# Patient Record
Sex: Female | Born: 1940
Health system: Southern US, Community
[De-identification: ages and names within clinical notes are randomized; demographics above are authoritative.]

## PROBLEM LIST (undated history)

## (undated) DIAGNOSIS — I639 Cerebral infarction, unspecified: Secondary | ICD-10-CM

## (undated) DIAGNOSIS — I10 Essential (primary) hypertension: Secondary | ICD-10-CM

## (undated) DIAGNOSIS — G5712 Meralgia paresthetica, left lower limb: Secondary | ICD-10-CM

## (undated) DIAGNOSIS — E119 Type 2 diabetes mellitus without complications: Secondary | ICD-10-CM

## (undated) DIAGNOSIS — E785 Hyperlipidemia, unspecified: Secondary | ICD-10-CM

## (undated) DIAGNOSIS — H8110 Benign paroxysmal vertigo, unspecified ear: Secondary | ICD-10-CM

## (undated) HISTORY — DX: Type 2 diabetes mellitus without complications: E11.9

## (undated) HISTORY — DX: Hyperlipidemia, unspecified: E78.5

## (undated) HISTORY — DX: Cerebral infarction, unspecified: I63.9

## (undated) HISTORY — DX: Meralgia paresthetica, left lower limb: G57.12

---

## 2004-04-09 ENCOUNTER — Emergency Department (HOSPITAL_COMMUNITY): Admission: EM | Admit: 2004-04-09 | Discharge: 2004-04-09 | Payer: Self-pay | Admitting: Family Medicine

## 2004-12-04 ENCOUNTER — Inpatient Hospital Stay (HOSPITAL_COMMUNITY): Admission: EM | Admit: 2004-12-04 | Discharge: 2004-12-05 | Payer: Self-pay | Admitting: Emergency Medicine

## 2004-12-04 ENCOUNTER — Ambulatory Visit: Payer: Self-pay | Admitting: Internal Medicine

## 2005-12-25 ENCOUNTER — Emergency Department (HOSPITAL_COMMUNITY): Admission: EM | Admit: 2005-12-25 | Discharge: 2005-12-25 | Payer: Self-pay | Admitting: Emergency Medicine

## 2006-10-29 ENCOUNTER — Emergency Department (HOSPITAL_COMMUNITY): Admission: EM | Admit: 2006-10-29 | Discharge: 2006-10-29 | Payer: Self-pay | Admitting: Family Medicine

## 2007-01-13 ENCOUNTER — Encounter: Admission: RE | Admit: 2007-01-13 | Discharge: 2007-01-13 | Payer: Self-pay | Admitting: Internal Medicine

## 2007-06-22 ENCOUNTER — Emergency Department (HOSPITAL_COMMUNITY): Admission: EM | Admit: 2007-06-22 | Discharge: 2007-06-22 | Payer: Self-pay | Admitting: Emergency Medicine

## 2007-07-20 ENCOUNTER — Emergency Department (HOSPITAL_COMMUNITY): Admission: EM | Admit: 2007-07-20 | Discharge: 2007-07-20 | Payer: Self-pay | Admitting: Family Medicine

## 2007-07-22 ENCOUNTER — Emergency Department (HOSPITAL_COMMUNITY): Admission: EM | Admit: 2007-07-22 | Discharge: 2007-07-22 | Payer: Self-pay | Admitting: Emergency Medicine

## 2010-04-04 ENCOUNTER — Other Ambulatory Visit: Payer: Self-pay | Admitting: Internal Medicine

## 2010-04-04 DIAGNOSIS — Z1231 Encounter for screening mammogram for malignant neoplasm of breast: Secondary | ICD-10-CM

## 2010-04-06 ENCOUNTER — Ambulatory Visit: Payer: Self-pay

## 2010-04-30 ENCOUNTER — Ambulatory Visit
Admission: RE | Admit: 2010-04-30 | Discharge: 2010-04-30 | Disposition: A | Discharge status: Home / Self Care | Payer: Medicare Other | Source: Ambulatory Visit | Attending: Internal Medicine | Admitting: Internal Medicine

## 2010-04-30 DIAGNOSIS — Z1231 Encounter for screening mammogram for malignant neoplasm of breast: Secondary | ICD-10-CM

## 2010-05-17 ENCOUNTER — Emergency Department (HOSPITAL_COMMUNITY)
Admission: EM | Admit: 2010-05-17 | Discharge: 2010-05-17 | Disposition: A | Discharge status: Home / Self Care | Payer: Medicare Other | Attending: Emergency Medicine | Admitting: Emergency Medicine

## 2010-05-17 DIAGNOSIS — E7800 Pure hypercholesterolemia, unspecified: Secondary | ICD-10-CM | POA: Insufficient documentation

## 2010-05-17 DIAGNOSIS — S30860A Insect bite (nonvenomous) of lower back and pelvis, initial encounter: Secondary | ICD-10-CM | POA: Insufficient documentation

## 2010-05-17 DIAGNOSIS — W57XXXA Bitten or stung by nonvenomous insect and other nonvenomous arthropods, initial encounter: Secondary | ICD-10-CM | POA: Insufficient documentation

## 2010-05-17 DIAGNOSIS — H8109 Meniere's disease, unspecified ear: Secondary | ICD-10-CM | POA: Insufficient documentation

## 2010-05-17 DIAGNOSIS — I10 Essential (primary) hypertension: Secondary | ICD-10-CM | POA: Insufficient documentation

## 2010-10-25 LAB — POCT I-STAT, CHEM 8
BUN: 10
Calcium, Ion: 1.19
Chloride: 108
Creatinine, Ser: 1
Glucose, Bld: 102 — ABNORMAL HIGH
HCT: 39
Hemoglobin: 13.3
Potassium: 4.5
Sodium: 140
TCO2: 25

## 2010-10-25 LAB — CBC
HCT: 40
Hemoglobin: 13.2
MCHC: 33
MCV: 87
Platelets: 250
RBC: 4.6
RDW: 13.4
WBC: 7.3

## 2010-10-25 LAB — DIFFERENTIAL
Basophils Absolute: 0.1
Basophils Relative: 1
Eosinophils Absolute: 0.1
Eosinophils Relative: 1
Lymphocytes Relative: 31
Lymphs Abs: 2.3
Monocytes Absolute: 0.4
Monocytes Relative: 5
Neutro Abs: 4.5
Neutrophils Relative %: 61

## 2011-05-06 ENCOUNTER — Other Ambulatory Visit: Payer: Self-pay | Admitting: Internal Medicine

## 2011-05-06 DIAGNOSIS — Z1231 Encounter for screening mammogram for malignant neoplasm of breast: Secondary | ICD-10-CM

## 2011-05-31 ENCOUNTER — Ambulatory Visit
Admission: RE | Admit: 2011-05-31 | Discharge: 2011-05-31 | Disposition: A | Discharge status: Home / Self Care | Payer: PRIVATE HEALTH INSURANCE | Source: Ambulatory Visit | Attending: Internal Medicine | Admitting: Internal Medicine

## 2011-05-31 DIAGNOSIS — Z1231 Encounter for screening mammogram for malignant neoplasm of breast: Secondary | ICD-10-CM

## 2011-06-04 ENCOUNTER — Other Ambulatory Visit: Payer: Self-pay | Admitting: Internal Medicine

## 2011-06-04 DIAGNOSIS — R928 Other abnormal and inconclusive findings on diagnostic imaging of breast: Secondary | ICD-10-CM

## 2011-06-06 ENCOUNTER — Ambulatory Visit
Admission: RE | Admit: 2011-06-06 | Discharge: 2011-06-06 | Disposition: A | Discharge status: Home / Self Care | Payer: PRIVATE HEALTH INSURANCE | Source: Ambulatory Visit | Attending: Internal Medicine | Admitting: Internal Medicine

## 2011-06-06 DIAGNOSIS — R928 Other abnormal and inconclusive findings on diagnostic imaging of breast: Secondary | ICD-10-CM

## 2012-06-13 ENCOUNTER — Encounter (HOSPITAL_COMMUNITY): Payer: Self-pay | Admitting: Emergency Medicine

## 2012-06-13 ENCOUNTER — Emergency Department (HOSPITAL_COMMUNITY)
Admission: EM | Admit: 2012-06-13 | Discharge: 2012-06-13 | Disposition: A | Discharge status: Home / Self Care | Payer: PRIVATE HEALTH INSURANCE | Source: Home / Self Care | Attending: Family Medicine | Admitting: Family Medicine

## 2012-06-13 ENCOUNTER — Emergency Department (INDEPENDENT_AMBULATORY_CARE_PROVIDER_SITE_OTHER): Payer: PRIVATE HEALTH INSURANCE

## 2012-06-13 DIAGNOSIS — S92919A Unspecified fracture of unspecified toe(s), initial encounter for closed fracture: Secondary | ICD-10-CM

## 2012-06-13 DIAGNOSIS — S92911A Unspecified fracture of right toe(s), initial encounter for closed fracture: Secondary | ICD-10-CM

## 2012-06-13 HISTORY — DX: Essential (primary) hypertension: I10

## 2012-06-13 MED ORDER — HYDROCODONE-ACETAMINOPHEN 5-325 MG PO TABS
1.0000 | ORAL_TABLET | Freq: Once | ORAL | Status: AC
  Administered 2012-06-13: 1 via ORAL

## 2012-06-13 MED ORDER — HYDROCODONE-ACETAMINOPHEN 5-325 MG PO TABS
ORAL_TABLET | ORAL | Status: AC
  Filled 2012-06-13: qty 1

## 2012-06-13 MED ORDER — IBUPROFEN 800 MG PO TABS
ORAL_TABLET | ORAL | Status: AC
  Filled 2012-06-13: qty 1

## 2012-06-13 MED ORDER — IBUPROFEN 800 MG PO TABS: 800.0000 mg | ORAL_TABLET | Freq: Once | ORAL | Status: DC

## 2012-06-13 MED ORDER — HYDROCODONE-ACETAMINOPHEN 5-325 MG PO TABS: 1.0000 | ORAL_TABLET | Freq: Four times a day (QID) | ORAL | Status: DC | PRN

## 2012-06-13 NOTE — ED Provider Notes (Signed)
History     CSN: [id number]  Arrival date & time 06/13/12  1127   First MD Initiated Contact with Patient 06/13/12 1237      Chief Complaint  Patient presents with  . Toe Injury    right pinky toe pain since wednesday. pt states that she stumped her toe. pain is gradually getting worse.     HPI: Patient is a 72 y.o. female presenting with foot injury. The history is provided by the patient.  Foot Injury Location:  Toe Time since incident:  4 days Injury: yes   Toe location:  R little toe Dislocation: no   Prior injury to area:  No Relieved by:  Nothing Worsened by:  Bearing weight Ineffective treatments:  Acetaminophen Associated symptoms: swelling   Associated symptoms: no numbness   Pt reports that 4 days ago she was "playing" around with her brother when she hit her (R) 5th toe on her brother's shoe. She felt the toe bend laterally and noted severe pain at the time. Since event pt has had notable swelling to the toe and persistent pain that has made sleeping difficult. Walking also is very painful. Tylenol has provided minimal pain relief.   Past Medical History  Diagnosis Date  . Hypertension     History reviewed. No pertinent past surgical history.  History reviewed. No pertinent family history.  History  Substance Use Topics  . Smoking status: Never Smoker   . Smokeless tobacco: Not on file  . Alcohol Use: No    OB History   Grav Para Term Preterm Abortions TAB SAB Ect Mult Living                  Review of Systems  All other systems reviewed and are negative.    Allergies  Review of patient's allergies indicates no known allergies.  Home Medications  No current outpatient prescriptions on file.  BP 179/76  Pulse 55  Temp(Src) 98.3 F (36.8 C) (Oral)  Resp 17  SpO2 100%  Physical Exam  Constitutional: She is oriented to person, place, and time. She appears well-developed and well-nourished.  HENT:  Head: Normocephalic and atraumatic.    Eyes: Conjunctivae are normal.  Cardiovascular: Normal rate.   Pulmonary/Chest: Effort normal.  Musculoskeletal:       Right foot: She exhibits decreased range of motion, tenderness and swelling. She exhibits no crepitus.       Feet:  No obvious deformity or open wound. No ecchymosis.  Neurological: She is alert and oriented to person, place, and time.  Skin: Skin is warm and dry.  Psychiatric: She has a normal mood and affect.    ED Course  Procedures (including critical care time)  Labs Reviewed - No data to display Dg Foot Complete Right  06/13/2012   *RADIOLOGY REPORT*  Clinical Data: Right foot injury and pain.  RIGHT FOOT COMPLETE - 3+ VIEW  Comparison: None  Findings: A minimally displaced oblique fracture of the little toe proximal phalanx is noted. This fracture does not appear to extend into the joint spaces. No other fracture, subluxation or dislocation identified. The Lisfranc joints are unremarkable. No focal bony lesions are present. Soft tissue swelling is identified.  IMPRESSION: Minimally displaced oblique fracture of the little toe proximal phalanx.  Soft tissue swelling.   Original Report Authenticated By: Harmon Pier, M.D.     No diagnosis found.    MDM  Toe injury 4 days ago. (R) foot xray reveals minimally displaced oblique fracture  of the (R)  little toe proximal phalanx. Buddy tape, wooden shoe, short course of med for pain and ortho referral for f/u if needed.         Leanne Chang, NP 06/13/12 [phone]

## 2012-06-13 NOTE — ED Notes (Signed)
Pt declined ibuprofen states "has hard time swallowing large pills". Mw,cma

## 2012-06-13 NOTE — ED Notes (Signed)
Pt states that on Wednesday she stumped her right pinky toe. Pt has tried soaking foot but pain has gradually gotten worse.  Pain with walking.  Some mild swelling.

## 2012-06-14 NOTE — ED Provider Notes (Signed)
Medical screening examination/treatment/procedure(s) were performed by resident physician or non-physician practitioner and as supervising physician I was immediately available for consultation/collaboration.   Barkley Bruns MD.   Linna Hoff, MD 06/14/12 [phone]

## 2012-08-10 ENCOUNTER — Other Ambulatory Visit: Payer: Self-pay

## 2012-08-10 DIAGNOSIS — Z1231 Encounter for screening mammogram for malignant neoplasm of breast: Secondary | ICD-10-CM

## 2012-08-11 ENCOUNTER — Emergency Department (HOSPITAL_COMMUNITY)
Admission: EM | Admit: 2012-08-11 | Discharge: 2012-08-11 | Disposition: A | Discharge status: Home / Self Care | Payer: Medicare Other | Attending: Emergency Medicine | Admitting: Emergency Medicine

## 2012-08-11 ENCOUNTER — Encounter (HOSPITAL_COMMUNITY): Payer: Self-pay | Admitting: Family Medicine

## 2012-08-11 DIAGNOSIS — I10 Essential (primary) hypertension: Secondary | ICD-10-CM | POA: Insufficient documentation

## 2012-08-11 DIAGNOSIS — R197 Diarrhea, unspecified: Secondary | ICD-10-CM | POA: Insufficient documentation

## 2012-08-11 DIAGNOSIS — Z79899 Other long term (current) drug therapy: Secondary | ICD-10-CM | POA: Insufficient documentation

## 2012-08-11 DIAGNOSIS — Z7982 Long term (current) use of aspirin: Secondary | ICD-10-CM | POA: Insufficient documentation

## 2012-08-11 DIAGNOSIS — R51 Headache: Secondary | ICD-10-CM | POA: Insufficient documentation

## 2012-08-11 DIAGNOSIS — R42 Dizziness and giddiness: Secondary | ICD-10-CM | POA: Insufficient documentation

## 2012-08-11 NOTE — ED Notes (Signed)
Per pt sts started taking vitamin D Sunday and now she is having diarrhea and dizzy spells. sts each day it gets worse.

## 2012-08-11 NOTE — ED Provider Notes (Signed)
History    CSN: [id number] Arrival date & time 08/11/12  1827  First MD Initiated Contact with Patient 08/11/12 1931     Chief Complaint  Patient presents with  . Allergic Reaction   (Consider location/radiation/quality/duration/timing/severity/associated sxs/prior Treatment) HPI Comments: INGRA ROTHER is a 72 y.o. female who is concerned that she has had an allergic reaction, to vitamin D, that she took 3 days ago. Since then, she has had generalized dizziness, headache, diarrhea, and weakness. She is also worried that her throat is swelling. She denies any rash. She took a single dose of vitamin D . She is unsure, why her doctor prescribed it. She denies shortness of breath, chest pain, abdominal pain, back, pain, paresthesias, or focal weakness. She has not had this problem previously. She tried taking meclizine, without relief. Her diarrhea has been 3-5 times a day, brown in color, and without  blood. There are no other known modifying factors.  Patient is a 72 y.o. female presenting with allergic reaction. The history is provided by the patient.  Allergic Reaction  Past Medical History  Diagnosis Date  . Hypertension    History reviewed. No pertinent past surgical history. History reviewed. No pertinent family history. History  Substance Use Topics  . Smoking status: Never Smoker   . Smokeless tobacco: Not on file  . Alcohol Use: No   OB History   Grav Para Term Preterm Abortions TAB SAB Ect Mult Living                 Review of Systems  All other systems reviewed and are negative.    Allergies  Review of patient's allergies indicates no known allergies.  Home Medications   Current Outpatient Rx  Name  Route  Sig  Dispense  Refill  . aspirin 325 MG tablet   Oral   Take 325 mg by mouth daily.         Marland Kitchen atorvastatin (LIPITOR) 20 MG tablet   Oral   Take 20 mg by mouth daily.         . ergocalciferol (VITAMIN D2) 50000 UNITS capsule   Oral   Take  50,000 Units by mouth once a week. Take on sundays         . lisinopril (PRINIVIL,ZESTRIL) 20 MG tablet   Oral   Take 20 mg by mouth daily.         . meclizine (ANTIVERT) 25 MG tablet   Oral   Take 25 mg by mouth 3 (three) times daily as needed for dizziness.          BP 168/86  Pulse 54  Temp(Src) 98.5 F (36.9 C)  Resp 18  SpO2 98% Physical Exam  Nursing note and vitals reviewed. Constitutional: She is oriented to person, place, and time. She appears well-developed.  Elderly, frail  HENT:  Head: Normocephalic and atraumatic.  Eyes: Conjunctivae and EOM are normal. Pupils are equal, round, and reactive to light.  Neck: Normal range of motion and phonation normal. Neck supple.  Cardiovascular: Normal rate, regular rhythm and intact distal pulses.   Pulmonary/Chest: Effort normal and breath sounds normal. She exhibits no tenderness.  Abdominal: Soft. She exhibits no distension. There is no tenderness. There is no guarding.  Musculoskeletal: Normal range of motion.  Lymphadenopathy:    She has no cervical adenopathy.  Neurological: She is alert and oriented to person, place, and time. She has normal strength. She exhibits normal muscle tone. Coordination normal.  She  is hard of hearing. No truncal ataxia.  Skin: Skin is warm and dry. No rash noted. No erythema. No pallor.  Psychiatric: She has a normal mood and affect. Her behavior is normal. Judgment and thought content normal.    ED Course  Procedures (including critical care time)        1. Dizziness     MDM  Nonspecific dizziness, diarrhea and headache, after taking the medication. I doubt this represents a true allergic reaction. She's had only a single dose, he may be intolerant of it. There is no sign of airway swelling, skin rash or anaphylaxis. Doubt cardiac or central nervous system abnormality. She is stable for discharge.   Nursing Notes Reviewed/ Care Coordinated, and agree without  changes. Applicable Imaging Reviewed.  Interpretation of Laboratory Data incorporated into ED treatment    Plan: Home Medications- usual except, avoid Vitamin D; Home Treatments and Observation- Rest fluids, watch for progressive sx; return here if the recommended treatment, does not improve the symptoms; Recommended follow up- PCP for check up in 2-3 days    Flint Melter, MD 08/11/12 2003

## 2012-08-11 NOTE — ED Notes (Signed)
Pt dc to home. Pt sts understanding to dc instructions. Pt ambulatory to exit without difficulty.  Pt denies need for w/c.  

## 2012-08-24 ENCOUNTER — Encounter (HOSPITAL_COMMUNITY): Payer: Self-pay | Admitting: Emergency Medicine

## 2012-08-24 ENCOUNTER — Emergency Department (HOSPITAL_COMMUNITY)
Admission: EM | Admit: 2012-08-24 | Discharge: 2012-08-24 | Discharge status: Against Medical Advice | Payer: Medicare Other | Attending: Emergency Medicine | Admitting: Emergency Medicine

## 2012-08-24 DIAGNOSIS — E785 Hyperlipidemia, unspecified: Secondary | ICD-10-CM | POA: Insufficient documentation

## 2012-08-24 DIAGNOSIS — Z87891 Personal history of nicotine dependence: Secondary | ICD-10-CM | POA: Insufficient documentation

## 2012-08-24 DIAGNOSIS — R42 Dizziness and giddiness: Secondary | ICD-10-CM | POA: Insufficient documentation

## 2012-08-24 DIAGNOSIS — Z8673 Personal history of transient ischemic attack (TIA), and cerebral infarction without residual deficits: Secondary | ICD-10-CM | POA: Insufficient documentation

## 2012-08-24 DIAGNOSIS — I10 Essential (primary) hypertension: Secondary | ICD-10-CM | POA: Insufficient documentation

## 2012-08-24 NOTE — ED Notes (Signed)
Called x3 for room placement w/no answer

## 2012-08-24 NOTE — ED Notes (Signed)
Was told she had sinus infection and was given antiobiotics today is her last day of meds

## 2012-08-24 NOTE — ED Notes (Signed)
Has vertigo any way has been seen at [facility] for that and takes meclizine states this really started when she started taking vit d 2

## 2012-08-24 NOTE — ED Notes (Signed)
Call x1 w/no answer

## 2012-08-24 NOTE — ED Notes (Signed)
Pt called x2 for room w/no answer   

## 2012-08-24 NOTE — ED Notes (Signed)
Dizzy x 2 weeks was seen here for same  But it has gotten worse she states saw drlast week also

## 2012-08-25 ENCOUNTER — Inpatient Hospital Stay (HOSPITAL_COMMUNITY)
Admission: EM | Admit: 2012-08-25 | Discharge: 2012-08-28 | DRG: 066 | Disposition: A | Discharge status: Home Health | Payer: Medicare Other | Attending: Internal Medicine | Admitting: Internal Medicine

## 2012-08-25 ENCOUNTER — Emergency Department (HOSPITAL_COMMUNITY): Payer: Medicare Other

## 2012-08-25 ENCOUNTER — Encounter (HOSPITAL_COMMUNITY): Payer: Self-pay | Admitting: Emergency Medicine

## 2012-08-25 DIAGNOSIS — I6350 Cerebral infarction due to unspecified occlusion or stenosis of unspecified cerebral artery: Principal | ICD-10-CM | POA: Diagnosis present

## 2012-08-25 DIAGNOSIS — I10 Essential (primary) hypertension: Secondary | ICD-10-CM | POA: Diagnosis present

## 2012-08-25 DIAGNOSIS — F40298 Other specified phobia: Secondary | ICD-10-CM | POA: Diagnosis present

## 2012-08-25 DIAGNOSIS — E785 Hyperlipidemia, unspecified: Secondary | ICD-10-CM | POA: Diagnosis present

## 2012-08-25 DIAGNOSIS — Z87891 Personal history of nicotine dependence: Secondary | ICD-10-CM

## 2012-08-25 DIAGNOSIS — N183 Chronic kidney disease, stage 3 unspecified: Secondary | ICD-10-CM | POA: Diagnosis present

## 2012-08-25 DIAGNOSIS — G464 Cerebellar stroke syndrome: Secondary | ICD-10-CM

## 2012-08-25 DIAGNOSIS — Z8249 Family history of ischemic heart disease and other diseases of the circulatory system: Secondary | ICD-10-CM

## 2012-08-25 DIAGNOSIS — Z8673 Personal history of transient ischemic attack (TIA), and cerebral infarction without residual deficits: Secondary | ICD-10-CM | POA: Diagnosis present

## 2012-08-25 DIAGNOSIS — Z7902 Long term (current) use of antithrombotics/antiplatelets: Secondary | ICD-10-CM

## 2012-08-25 DIAGNOSIS — I639 Cerebral infarction, unspecified: Secondary | ICD-10-CM

## 2012-08-25 DIAGNOSIS — Z79899 Other long term (current) drug therapy: Secondary | ICD-10-CM

## 2012-08-25 DIAGNOSIS — H8110 Benign paroxysmal vertigo, unspecified ear: Secondary | ICD-10-CM | POA: Diagnosis present

## 2012-08-25 HISTORY — DX: Essential (primary) hypertension: I10

## 2012-08-25 HISTORY — DX: Hyperlipidemia, unspecified: E78.5

## 2012-08-25 HISTORY — DX: Cerebral infarction, unspecified: I63.9

## 2012-08-25 HISTORY — DX: Benign paroxysmal vertigo, unspecified ear: H81.10

## 2012-08-25 LAB — CBC WITH DIFFERENTIAL/PLATELET
Basophils Absolute: 0.1 10*3/uL (ref 0.0–0.1)
Basophils Relative: 1 % (ref 0–1)
Eosinophils Absolute: 0.3 10*3/uL (ref 0.0–0.7)
Hemoglobin: 13.3 g/dL (ref 12.0–15.0)
MCH: 29 pg (ref 26.0–34.0)
MCHC: 33.3 g/dL (ref 30.0–36.0)
Monocytes Relative: 5 % (ref 3–12)
Neutro Abs: 2.7 10*3/uL (ref 1.7–7.7)
Neutrophils Relative %: 41 % — ABNORMAL LOW (ref 43–77)
Platelets: 228 10*3/uL (ref 150–400)
RDW: 14.2 % (ref 11.5–15.5)

## 2012-08-25 LAB — CBC
HCT: 35.4 % — ABNORMAL LOW (ref 36.0–46.0)
Hemoglobin: 11.8 g/dL — ABNORMAL LOW (ref 12.0–15.0)
MCH: 28.7 pg (ref 26.0–34.0)
MCHC: 33.3 g/dL (ref 30.0–36.0)
RBC: 4.11 MIL/uL (ref 3.87–5.11)

## 2012-08-25 LAB — POCT I-STAT TROPONIN I

## 2012-08-25 LAB — URINALYSIS, ROUTINE W REFLEX MICROSCOPIC
Bilirubin Urine: NEGATIVE
Leukocytes, UA: NEGATIVE
Nitrite: NEGATIVE
Specific Gravity, Urine: 1.004 — ABNORMAL LOW (ref 1.005–1.030)
Urobilinogen, UA: 0.2 mg/dL (ref 0.0–1.0)
pH: 6.5 (ref 5.0–8.0)

## 2012-08-25 LAB — GLUCOSE, CAPILLARY: Glucose-Capillary: 113 mg/dL — ABNORMAL HIGH (ref 70–99)

## 2012-08-25 LAB — URINE MICROSCOPIC-ADD ON

## 2012-08-25 LAB — BASIC METABOLIC PANEL
Chloride: 103 mEq/L (ref 96–112)
GFR calc Af Amer: 76 mL/min — ABNORMAL LOW (ref >90)
GFR calc non Af Amer: 66 mL/min — ABNORMAL LOW (ref >90)
Potassium: 3.8 mEq/L (ref 3.5–5.1)
Sodium: 137 mEq/L (ref 135–145)

## 2012-08-25 LAB — CREATININE, SERUM: Creatinine, Ser: 0.91 mg/dL (ref 0.50–1.10)

## 2012-08-25 LAB — TROPONIN I: Troponin I: <0.3 ng/mL (ref <0.30)

## 2012-08-25 MED ORDER — ATORVASTATIN CALCIUM 40 MG PO TABS
40.0000 mg | ORAL_TABLET | Freq: Every day | ORAL | Status: DC
  Administered 2012-08-25 – 2012-08-28 (×4): 40 mg via ORAL
  Filled 2012-08-25 (×4): qty 1

## 2012-08-25 MED ORDER — LORAZEPAM 2 MG/ML IJ SOLN
2.0000 mg | Freq: Once | INTRAMUSCULAR | Status: DC
  Filled 2012-08-25: qty 1

## 2012-08-25 MED ORDER — SENNOSIDES-DOCUSATE SODIUM 8.6-50 MG PO TABS: 1.0000 | ORAL_TABLET | Freq: Every evening | ORAL | Status: DC | PRN

## 2012-08-25 MED ORDER — LISINOPRIL 20 MG PO TABS
20.0000 mg | ORAL_TABLET | Freq: Every day | ORAL | Status: DC
  Administered 2012-08-26 – 2012-08-27 (×2): 20 mg via ORAL
  Filled 2012-08-25 (×3): qty 1

## 2012-08-25 MED ORDER — ALCAFTADINE 0.25 % OP SOLN: 1.0000 [drp] | Freq: Every evening | OPHTHALMIC | Status: DC

## 2012-08-25 MED ORDER — CLOPIDOGREL BISULFATE 75 MG PO TABS
75.0000 mg | ORAL_TABLET | Freq: Every day | ORAL | Status: DC
  Administered 2012-08-26 – 2012-08-28 (×3): 75 mg via ORAL
  Filled 2012-08-25 (×4): qty 1

## 2012-08-25 MED ORDER — ONDANSETRON 4 MG PO TBDP
4.0000 mg | ORAL_TABLET | Freq: Once | ORAL | Status: DC
  Filled 2012-08-25 (×2): qty 1

## 2012-08-25 MED ORDER — DIAZEPAM 5 MG PO TABS
5.0000 mg | ORAL_TABLET | Freq: Once | ORAL | Status: AC
  Administered 2012-08-25: 5 mg via ORAL
  Filled 2012-08-25: qty 1

## 2012-08-25 MED ORDER — SODIUM CHLORIDE 0.9 % IJ SOLN
3.0000 mL | Freq: Two times a day (BID) | INTRAMUSCULAR | Status: DC
  Administered 2012-08-25 – 2012-08-28 (×6): 3 mL via INTRAVENOUS

## 2012-08-25 MED ORDER — HEPARIN SODIUM (PORCINE) 5000 UNIT/ML IJ SOLN
5000.0000 [IU] | Freq: Three times a day (TID) | INTRAMUSCULAR | Status: DC
  Administered 2012-08-25 – 2012-08-26 (×3): 5000 [IU] via SUBCUTANEOUS
  Filled 2012-08-25 (×5): qty 1

## 2012-08-25 MED ORDER — LORAZEPAM 2 MG/ML IJ SOLN
2.0000 mg | Freq: Once | INTRAMUSCULAR | Status: AC
  Administered 2012-08-25: 2 mg via INTRAMUSCULAR

## 2012-08-25 MED ORDER — SODIUM CHLORIDE 0.9 % IV BOLUS (SEPSIS)
1000.0000 mL | Freq: Once | INTRAVENOUS | Status: AC
  Administered 2012-08-25: 1000 mL via INTRAVENOUS

## 2012-08-25 MED ORDER — AMITRIPTYLINE HCL 10 MG PO TABS
10.0000 mg | ORAL_TABLET | Freq: Every day | ORAL | Status: DC
  Administered 2012-08-25 – 2012-08-27 (×3): 10 mg via ORAL
  Filled 2012-08-25 (×4): qty 1

## 2012-08-25 MED ORDER — OLOPATADINE HCL 0.1 % OP SOLN
1.0000 [drp] | Freq: Two times a day (BID) | OPHTHALMIC | Status: DC
  Administered 2012-08-25 – 2012-08-28 (×5): 1 [drp] via OPHTHALMIC
  Filled 2012-08-25 (×3): qty 5

## 2012-08-25 MED ORDER — SODIUM CHLORIDE 0.9 % IV SOLN: 250.0000 mL | INTRAVENOUS | Status: DC | PRN

## 2012-08-25 MED ORDER — SODIUM CHLORIDE 0.9 % IJ SOLN: 3.0000 mL | INTRAMUSCULAR | Status: DC | PRN

## 2012-08-25 NOTE — H&P (Signed)
[facility] History and Physical  Donna Gilmore  ZOX:[id number]  DOB: 10-Nov-1940  DOA: 08/25/2012  Referring physician: Dr Tanna Savoy PCP: Dorrene German, MD  Specialists: neurology  Chief Complaint: dizziness  HPI: Donna Gilmore is a 72 y.o. female with Past medical history of hypertension who presents today with complain of dizziness that has been progressing since last 3 days. She says she was at her baseline onnsunday morning and around 6pm Sunday evening she started feeling dizzy and lightheadedness. Along with that she had a sense of feeling tired and since her tiredness was getting worse she decided to come to hospital. She says that her dizziness is not vertigo, it feels in her head. She does not have any headache, or complain of focal neurological deficit. She denies any trauma or fall. Pt denies any fever, chills, headache, nausea, vomiting, abdominal pain, diarrhea, constipation, active bleeding, chest pain, palpitation, dizziness, pedal edema, orthopnea, PND, burning urination, cough, shortness of breath.   Review of Systems: as mentioned in the history of present illness.  A Comprehensive review of the other systems is negative.  Past Medical History  Diagnosis Date  . Hypertension   . Dyslipidemia 08/25/2012  . Essential hypertension, benign 08/25/2012  . CVA (cerebral infarction) 08/25/2012   History reviewed. No pertinent past surgical history. Social History:  reports that she has never smoked. She does not have any smokeless tobacco history on file. She reports that she does not drink alcohol or use illicit drugs. Patient is coming from home. Patient can not participate in ADLs.  No Known Allergies  No family history on file.  Prior to Admission medications   Medication Sig Start Date End Date Taking? Authorizing Provider  Alcaftadine (LASTACAFT) 0.25 % SOLN Apply 1 drop to eye every evening. One drop in each eye   Yes Historical Provider, MD   amitriptyline (ELAVIL) 10 MG tablet Take 10 mg by mouth at bedtime.   Yes Historical Provider, MD  aspirin EC 81 MG tablet Take 81 mg by mouth daily.   Yes Historical Provider, MD  atorvastatin (LIPITOR) 20 MG tablet Take 20 mg by mouth daily.   Yes Historical Provider, MD  cetirizine (ZYRTEC) 10 MG tablet Take 10 mg by mouth daily.   Yes Historical Provider, MD  lisinopril (PRINIVIL,ZESTRIL) 20 MG tablet Take 20 mg by mouth daily.   Yes Historical Provider, MD  meclizine (ANTIVERT) 25 MG tablet Take 25 mg by mouth 2 (two) times daily.    Yes Historical Provider, MD  vitamin C (ASCORBIC ACID) 500 MG tablet Take 500 mg by mouth daily.   Yes Historical Provider, MD    Physical Exam: Filed Vitals:   08/25/12 1930 08/25/12 2000 08/25/12 2038 08/25/12 2056  BP: 161/75 146/82  137/61  Pulse: 57 65  64  Temp:   98.1 F (36.7 C)   TempSrc:      Resp: 18 14  13   SpO2: 100% 100%  100%    General: Alert, Awake and Oriented to Time, Place and Person. Appear in mild distress Eyes: PERRL ENT: Oral Mucosa clear moist. Neck: no JVD, no Carotid Bruits, no Stiffness Cardiovascular: S1 and S2 Present, no murmur, Peripheral Pulses Present Respiratory: Clear to Auscultation, Bilateral Air entry equal and Decreased Abdomen: Bowel Sound Present, Soft and Non tender,no Organomegaly Skin: no decubitus Ulcer Extremities: no Pedal edema, no calf tenderness  Neurologic: Mental status, Motor strength, Sensation, reflexes, Proprioception Grossly Unremarkable. Station and balance not checked due to patient preference, No  dysdiadochokinesia. Babinski negative, Finger nose finger normal. Nystagmus present horizontal.   Labs on Admission:  Basic Metabolic Panel:  Recent Labs Lab 08/25/12 1502  NA 137  K 3.8  CL 103  CO2 22  GLUCOSE 133*  BUN 8  CREATININE 0.86  CALCIUM 9.7   Liver Function Tests: No results found for this basename: AST, ALT, ALKPHOS, BILITOT, PROT, ALBUMIN,  in the last 168  hours No results found for this basename: LIPASE, AMYLASE,  in the last 168 hours No results found for this basename: AMMONIA,  in the last 168 hours CBC:  Recent Labs Lab 08/25/12 1502  WBC 6.5  NEUTROABS 2.7  HGB 13.3  HCT 39.9  MCV 87.1  PLT 228   Cardiac Enzymes: No results found for this basename: CKTOTAL, CKMB, CKMBINDEX, TROPONINI,  in the last 168 hours  BNP (last 3 results) No results found for this basename: PROBNP,  in the last 8760 hours CBG: No results found for this basename: GLUCAP,  in the last 168 hours  Radiological Exams on Admission: Mr Brain Wo Contrast  08/25/2012   *RADIOLOGY REPORT*  Clinical Data: Vertigo of 2 weeks dizziness  MRI HEAD WITHOUT CONTRAST  Technique:  Multiplanar, multiecho pulse sequences of the brain and surrounding structures were obtained according to standard protocol without intravenous contrast.  Comparison: CT 07/22/2007  Findings: Small area of restricted diffusion left inferior cerebellum compatible with acute infarct measuring 5 x 7 mm. Additional sub centimeter area of acute infarct in the right superior cerebellum.  Ventricle size is normal.  Small areas of chronic infarction in the cerebellum bilaterally.  Negative for hemorrhage or mass lesion.  No shift to the midline structures.  Paranasal sinuses are clear.  IMPRESSION: Small areas of acute and chronic infarct in the cerebellum bilaterally.   Original Report Authenticated By: Janeece Riggers, M.D.    EKG: Independently reviewed. No arrythmia seen.  Assessment/Plan Principal Problem:   CVA (cerebral infarction) Active Problems:   Essential hypertension, benign   Dyslipidemia   1. CVA Patient has an MRI in the ER which is positive for a few small infarct in bilateral cerebellum. Patient only has a symptom of dizziness that correlates well with the area of infarct.  At present patient does not have any other focal neurological deficit, she is able to protect her airway and able  to eat. We would admit her for stroke workup. We will follow serial troponins, telemetry, echocardiogram tomorrow, EKG tomorrow We will get neuro consult She has been on aspirin, since she has both acute and chronic infarct we will change to Plavix. Lipid profile tomorrow and place her on Lipitor 40. At present permissive hypertension. PTOT and speech therapy will be consulted. Carotid artery duplex would be obtained.  2. history of hypertension Patient has not taking her antihypertensive since last 3 days as she was feeling dizziness and her blood pressure was okay. At present permissive hypertension due to recent stroke. Holding her antihypertensive.  3. Dyslipidemia Lipid profile tomorrow, increase Lipitor to 40   DVT Prophylaxis: Heparin Nutrition: Cardiac  Code Status: Full  Family Communication: Daughter was at bedside, and plan was discussed with them.   Author: Lynden Oxford, MD [facility] Pager: [phone] 08/25/2012 9:44 PM    If 7PM-7AM, please contact night-coverage [email or web address] Password [email or web address]

## 2012-08-25 NOTE — ED Notes (Signed)
Patient refused MRI at this time.  Patient reports "feeling worse when I lay flat." Patient agreed to going back after receiving medication.  EDP notified.

## 2012-08-25 NOTE — ED Provider Notes (Addendum)
CSN: [id number]     Arrival date & time 08/25/12  1234 History     First MD Initiated Contact with Patient 08/25/12 1427     Chief Complaint  Patient presents with  . Dizziness   (Consider location/radiation/quality/duration/timing/severity/associated sxs/prior Treatment) HPI Comments: Patient presents with a complaint of 2 weeks of dizziness. She describes it had an unusual sensation in her head where she feels nauseated with intermittent headaches but does not feel like her typical vertigo like she spinning. She states it all started after a vitamin D supplement.  She has seen her physician since the symptoms started and was told she had a sinus infection and was placed on penicillin which she is taking her last dose today but it has not helped her symptoms. Her symptoms are worse when she moves her head and her eyes. She has had intermittent headaches and nausea but denies any focal weakness, visual changes or numbness/tingling. No cough, congestion or fever. She denies any neck pain. No medication changes in the Antivert is not helping her dizziness  The history is provided by the patient.    Past Medical History  Diagnosis Date  . Hypertension    History reviewed. No pertinent past surgical history. No family history on file. History  Substance Use Topics  . Smoking status: Never Smoker   . Smokeless tobacco: Not on file  . Alcohol Use: No   OB History   Grav Para Term Preterm Abortions TAB SAB Ect Mult Living                 Review of Systems  HENT: Negative for ear pain, congestion and postnasal drip.   Respiratory: Negative for cough and shortness of breath.   Cardiovascular: Negative for chest pain.  Gastrointestinal: Positive for nausea. Negative for vomiting and abdominal pain.  Neurological: Positive for dizziness and headaches. Negative for speech difficulty and weakness.  All other systems reviewed and are negative.    Allergies  Review of patient's allergies  indicates no known allergies.  Home Medications   Current Outpatient Rx  Name  Route  Sig  Dispense  Refill  . Alcaftadine (LASTACAFT) 0.25 % SOLN   Ophthalmic   Apply 1 drop to eye every evening. One drop in each eye         . amitriptyline (ELAVIL) 10 MG tablet   Oral   Take 10 mg by mouth at bedtime.         Marland Kitchen aspirin EC 81 MG tablet   Oral   Take 81 mg by mouth daily.         Marland Kitchen atorvastatin (LIPITOR) 20 MG tablet   Oral   Take 20 mg by mouth daily.         . cetirizine (ZYRTEC) 10 MG tablet   Oral   Take 10 mg by mouth daily.         Marland Kitchen lisinopril (PRINIVIL,ZESTRIL) 20 MG tablet   Oral   Take 20 mg by mouth daily.         . meclizine (ANTIVERT) 25 MG tablet   Oral   Take 25 mg by mouth 2 (two) times daily.          . vitamin C (ASCORBIC ACID) 500 MG tablet   Oral   Take 500 mg by mouth daily.          BP 147/69  Pulse 51  Temp(Src) 97.9 F (36.6 C) (Oral)  Resp 16  SpO2 100%  Physical Exam  Nursing note and vitals reviewed. Constitutional: She is oriented to person, place, and time. She appears well-developed and well-nourished. No distress.  HENT:  Head: Normocephalic and atraumatic.  Mouth/Throat: Oropharynx is clear and moist.  Eyes: Conjunctivae and EOM are normal. Pupils are equal, round, and reactive to light.  Mild nystagmus when patient looks to the right. Cannot tolerate looking up or down  Neck: Normal range of motion. Neck supple. Carotid bruit is not present.  Cardiovascular: Normal rate, regular rhythm and intact distal pulses.   No murmur heard. Pulmonary/Chest: Effort normal and breath sounds normal. No respiratory distress. She has no wheezes. She has no rales.  Abdominal: Soft. She exhibits no distension. There is no tenderness. There is no rebound and no guarding.  Musculoskeletal: Normal range of motion. She exhibits no edema and no tenderness.  Neurological: She is alert and oriented to person, place, and time. She has  normal strength. No cranial nerve deficit or sensory deficit. Coordination normal.  Normal heel-to-shin testing. No visual field cuts  Skin: Skin is warm and dry. No rash noted. No erythema.  Psychiatric: She has a normal mood and affect. Her behavior is normal.    ED Course   Procedures (including critical care time)  Labs Reviewed  CBC WITH DIFFERENTIAL - Abnormal; Notable for the following:    Neutrophils Relative % 41 (*)    Lymphocytes Relative 50 (*)    All other components within normal limits  BASIC METABOLIC PANEL - Abnormal; Notable for the following:    Glucose, Bld 133 (*)    GFR calc non Af Amer 66 (*)    GFR calc Af Amer 76 (*)    All other components within normal limits  URINALYSIS, ROUTINE W REFLEX MICROSCOPIC - Abnormal; Notable for the following:    Specific Gravity, Urine 1.004 (*)    Hgb urine dipstick SMALL (*)    All other components within normal limits  URINE MICROSCOPIC-ADD ON  POCT I-STAT TROPONIN I    Date: 08/25/2012  Rate: 54  Rhythm: right/left arm lead reversal, sinus rhythm  QRS Axis: normal  Intervals: normal  ST/T Wave abnormalities: non-specific t-wave changes  Conduction Disutrbances:none  Narrative Interpretation:   Old EKG Reviewed: changes noted   Mr Brain Wo Contrast  08/25/2012   *RADIOLOGY REPORT*  Clinical Data: Vertigo of 2 weeks dizziness  MRI HEAD WITHOUT CONTRAST  Technique:  Multiplanar, multiecho pulse sequences of the brain and surrounding structures were obtained according to standard protocol without intravenous contrast.  Comparison: CT 07/22/2007  Findings: Small area of restricted diffusion left inferior cerebellum compatible with acute infarct measuring 5 x 7 mm. Additional sub centimeter area of acute infarct in the right superior cerebellum.  Ventricle size is normal.  Small areas of chronic infarction in the cerebellum bilaterally.  Negative for hemorrhage or mass lesion.  No shift to the midline structures.  Paranasal  sinuses are clear.  IMPRESSION: Small areas of acute and chronic infarct in the cerebellum bilaterally.   Original Report Authenticated By: Janeece Riggers, M.D.   1. Cerebellar stroke syndrome     MDM   Patient with symptoms of dizziness for the last 2 weeks that are worse with head movement and position change it feels different than her typical vertigo. She saw her physician last week and was told she had a sinus infection and was started on penicillin however today is her last [place] to penicillin and she's not feeling any better. Daughter also  feels that she's had some minor slurred speech. Awning exam patient has swollen nasal term events and some congestion but otherwise has some mild nystagmus when looking to the right and reproduced symptoms with movement of her head. No focal neurologic deficits. Normal strength, negative heel-to-shin testing and no visual field cuts. Given length of symptoms as well as history of hypertension and hyperlipidemia will evaluate for cerebellar stroke. Also CBC BMP pending.  7:00 PM MRI with evidence of cerebellar stroke.  Will admit for further care.  Gwyneth Sprout, MD 08/25/12 1901  Gwyneth Sprout, MD 08/25/12 [phone]

## 2012-08-25 NOTE — ED Notes (Signed)
Caregiver stated, she's had vertigo for 2 weeks sent here by her Dr. For re-evaluation.

## 2012-08-25 NOTE — ED Notes (Signed)
Admitting MD speaking with pt and family x3 at [facility], pt alert, NAD, calm, interactive, skin W&D, resps e/u, speaking in clear complete sentences, preparing to transport, PIV established.

## 2012-08-26 ENCOUNTER — Encounter (HOSPITAL_COMMUNITY): Payer: Self-pay | Admitting: Internal Medicine

## 2012-08-26 ENCOUNTER — Inpatient Hospital Stay (HOSPITAL_COMMUNITY): Payer: Medicare Other

## 2012-08-26 DIAGNOSIS — I6789 Other cerebrovascular disease: Secondary | ICD-10-CM

## 2012-08-26 DIAGNOSIS — E785 Hyperlipidemia, unspecified: Secondary | ICD-10-CM

## 2012-08-26 DIAGNOSIS — I10 Essential (primary) hypertension: Secondary | ICD-10-CM

## 2012-08-26 DIAGNOSIS — H8110 Benign paroxysmal vertigo, unspecified ear: Secondary | ICD-10-CM | POA: Diagnosis present

## 2012-08-26 HISTORY — DX: Benign paroxysmal vertigo, unspecified ear: H81.10

## 2012-08-26 LAB — COMPREHENSIVE METABOLIC PANEL
ALT: 12 U/L (ref 0–35)
AST: 20 U/L (ref 0–37)
Albumin: 3.3 g/dL — ABNORMAL LOW (ref 3.5–5.2)
Alkaline Phosphatase: 104 U/L (ref 39–117)
BUN: 11 mg/dL (ref 6–23)
CO2: 23 mEq/L (ref 19–32)
Calcium: 9.5 mg/dL (ref 8.4–10.5)
Chloride: 104 mEq/L (ref 96–112)
Creatinine, Ser: 0.93 mg/dL (ref 0.50–1.10)
GFR calc Af Amer: 69 mL/min — ABNORMAL LOW (ref >90)
GFR calc non Af Amer: 60 mL/min — ABNORMAL LOW (ref >90)
Glucose, Bld: 93 mg/dL (ref 70–99)
Potassium: 4.3 mEq/L (ref 3.5–5.1)
Sodium: 137 mEq/L (ref 135–145)
Total Bilirubin: 0.3 mg/dL (ref 0.3–1.2)
Total Protein: 6.7 g/dL (ref 6.0–8.3)

## 2012-08-26 LAB — GLUCOSE, CAPILLARY
Glucose-Capillary: 100 mg/dL — ABNORMAL HIGH (ref 70–99)
Glucose-Capillary: 106 mg/dL — ABNORMAL HIGH (ref 70–99)

## 2012-08-26 LAB — RAPID URINE DRUG SCREEN, HOSP PERFORMED
Barbiturates: NOT DETECTED
Benzodiazepines: NOT DETECTED
Cocaine: NOT DETECTED

## 2012-08-26 LAB — TROPONIN I
Troponin I: <0.3 ng/mL (ref <0.30)
Troponin I: <0.3 ng/mL (ref <0.30)
Troponin I: <0.3 ng/mL (ref <0.30)

## 2012-08-26 LAB — CBC WITH DIFFERENTIAL/PLATELET
Basophils Absolute: 0 10*3/uL (ref 0.0–0.1)
Basophils Relative: 1 % (ref 0–1)
Eosinophils Absolute: 0.2 10*3/uL (ref 0.0–0.7)
Eosinophils Relative: 4 % (ref 0–5)
HCT: 37.1 % (ref 36.0–46.0)
Hemoglobin: 12.8 g/dL (ref 12.0–15.0)
Lymphocytes Relative: 46 % (ref 12–46)
Lymphs Abs: 2.7 10*3/uL (ref 0.7–4.0)
MCH: 29.8 pg (ref 26.0–34.0)
MCHC: 34.5 g/dL (ref 30.0–36.0)
MCV: 86.3 fL (ref 78.0–100.0)
Monocytes Absolute: 0.4 10*3/uL (ref 0.1–1.0)
Monocytes Relative: 6 % (ref 3–12)
Neutro Abs: 2.6 10*3/uL (ref 1.7–7.7)
Neutrophils Relative %: 44 % (ref 43–77)
Platelets: 210 10*3/uL (ref 150–400)
RBC: 4.3 MIL/uL (ref 3.87–5.11)
RDW: 14 % (ref 11.5–15.5)
WBC: 5.9 10*3/uL (ref 4.0–10.5)

## 2012-08-26 LAB — LIPID PANEL
Cholesterol: 271 mg/dL — ABNORMAL HIGH (ref 0–200)
HDL: 31 mg/dL — ABNORMAL LOW (ref >39)
Total CHOL/HDL Ratio: 8.7 RATIO

## 2012-08-26 LAB — PROTIME-INR
INR: 0.99 (ref 0.00–1.49)
Prothrombin Time: 12.9 seconds (ref 11.6–15.2)

## 2012-08-26 MED ORDER — LORATADINE 10 MG PO TABS
10.0000 mg | ORAL_TABLET | Freq: Every day | ORAL | Status: DC
  Administered 2012-08-26 – 2012-08-28 (×3): 10 mg via ORAL
  Filled 2012-08-26 (×3): qty 1

## 2012-08-26 MED ORDER — MECLIZINE HCL 25 MG PO TABS
25.0000 mg | ORAL_TABLET | Freq: Two times a day (BID) | ORAL | Status: DC
  Administered 2012-08-26 – 2012-08-28 (×5): 25 mg via ORAL
  Filled 2012-08-26 (×6): qty 1

## 2012-08-26 MED ORDER — LORAZEPAM 2 MG/ML IJ SOLN: 1.0000 mg | Freq: Once | INTRAMUSCULAR | Status: DC

## 2012-08-26 MED ORDER — ENOXAPARIN SODIUM 30 MG/0.3ML ~~LOC~~ SOLN
30.0000 mg | Freq: Every day | SUBCUTANEOUS | Status: DC
  Administered 2012-08-26 – 2012-08-27 (×2): 30 mg via SUBCUTANEOUS
  Filled 2012-08-26 (×3): qty 0.3

## 2012-08-26 NOTE — Progress Notes (Signed)
Pt's daughter was bedside when I arrived; pt was getting back into bed from restroom w/assistance; pt was in bed during visit. Pt said she is ready to go home but cooperative w/staff and care given. Had prayer w/pt and daughter. Pt was thankful for prayer and visit. Marjory Lies Chaplain  08/26/12 1800  Clinical Encounter Type  Visited With Patient and family together

## 2012-08-26 NOTE — Progress Notes (Signed)
SPEECH PATHOLOGY    Reviewed chart and Bedside Swallow Evaluation and agree with previous note.  Maryjo Rochester, CCC-SLP

## 2012-08-26 NOTE — Progress Notes (Deleted)
  Echocardiogram 2D Echocardiogram has been performed.  Hiroki Wint 08/26/2012, 3:04 PM

## 2012-08-26 NOTE — Evaluation (Signed)
Physical Therapy Evaluation Patient Details Name: Donna Gilmore MRN: [id number] DOB: November 22, 1940 Today's Date: 08/26/2012 Time: 0865-7846 PT Time Calculation (min): 26 min  PT Assessment / Plan / Recommendation History of Present Illness  Donna Gilmore is a 72 y.o. female with Past medical history of hypertension who presents today with complain of dizziness that has been progressing since last 3 days. She says she was at her baseline onnsunday morning and around 6pm Sunday evening she started feeling dizzy and lightheadedness. Along with that she had a sense of feeling tired and since her tiredness was getting worse she decided to come to hospital.  Clinical Impression  Pt admitted with dizziness and fatigue over the last 2 weeks, progressing over last few days.   Pt currently with functional limitations due to the deficits listed below (see PT Problem List).  Pt will benefit from skilled PT to increase their independence and safety with mobility to allow discharge to home with available assist.      PT Assessment  Patient needs continued PT services    Follow Up Recommendations  Outpatient PT;Other (comment) (vs no follow up)    Does the patient have the potential to tolerate intense rehabilitation      Barriers to Discharge        Equipment Recommendations  None recommended by PT    Recommendations for Other Services     Frequency Min 3X/week    Precautions / Restrictions Precautions Precautions: Fall   Pertinent Vitals/Pain       Mobility  Bed Mobility Bed Mobility: Supine to Sit;Sitting - Scoot to Edge of Bed Supine to Sit: 5: Supervision;HOB flat Sitting - Scoot to Edge of Bed: 5: Supervision Details for Bed Mobility Assistance: little bit of a struggle, but no assist needed Transfers Transfers: Sit to Stand;Stand to Sit Sit to Stand: 5: Supervision Stand to Sit: 5: Supervision Ambulation/Gait Ambulation/Gait Assistance: 5: Supervision Ambulation Distance  (Feet): 300 Feet Assistive device: None Ambulation/Gait Assistance Details: generally steady, with mild self-recoverable unsteadiness on occaision. Gait Pattern: Step-through pattern Gait velocity: generally slower, but more steady when cued to speed up slightly Stairs: No Wheelchair Mobility Wheelchair Mobility: No Modified Rankin (Stroke Patients Only) Pre-Morbid Rankin Score: No symptoms Modified Rankin: Slight disability    Exercises     PT Diagnosis: Other (comment) (dizziness and decreased balance)  PT Problem List: Decreased balance (dizziness) PT Treatment Interventions: Gait training;Stair training;Functional mobility training;Balance training;Patient/family education     PT Goals(Current goals can be found in the care plan section) Acute Rehab PT Goals Patient Stated Goal: not shared by pt. PT Goal Formulation: With patient Time For Goal Achievement: 08/26/12 Potential to Achieve Goals: Good  Visit Information  Last PT Received On: 08/26/12 Assistance Needed: +1 History of Present Illness: Donna Gilmore is a 72 y.o. female with Past medical history of hypertension who presents today with complain of dizziness that has been progressing since last 3 days. She says she was at her baseline onnsunday morning and around 6pm Sunday evening she started feeling dizzy and lightheadedness. Along with that she had a sense of feeling tired and since her tiredness was getting worse she decided to come to hospital.       Prior Functioning  Home Living Family/patient expects to be discharged to:: Private residence Living Arrangements: Other (Comment) (her Mom lives with her ) Available Help at Discharge: Family;Other (Comment) (Mom has an outside caregiver) Type of Home: House Home Access: Other (comment);Ramped entrance (being built)  Home Layout: One level Home Equipment: Walker - 2 wheels;Cane - single point;Wheelchair - manual Prior Function Level of Independence:  Independent Communication Communication: No difficulties;HOH    Cognition  Cognition Arousal/Alertness: Awake/alert Behavior During Therapy: WFL for tasks assessed/performed Overall Cognitive Status: Within Functional Limits for tasks assessed    Extremity/Trunk Assessment Lower Extremity Assessment Lower Extremity Assessment: Overall WFL for tasks assessed Cervical / Trunk Assessment Cervical / Trunk Assessment: Normal   Balance Balance Balance Assessed: Yes Dynamic Sitting Balance Dynamic Sitting - Balance Support: Feet unsupported;Feet supported;No upper extremity supported Dynamic Sitting - Level of Assistance: 7: Independent;Other (comment) (putting on shoes) Dynamic Sitting - Comments: used various normal strategies to donn shoes incl bending to the floor to tie shoes.  These movements produced dizziness.  End of Session PT - End of Session Activity Tolerance: Patient tolerated treatment well Patient left: Other (comment) (w/c to testing) Nurse Communication: Mobility status  GP     Nayshawn Mesta, Eliseo Gum 08/26/2012, 10:47 AM 08/26/2012  [place] Bing, PT [phone]  (pager)

## 2012-08-26 NOTE — Progress Notes (Signed)
UR COMPLETED  

## 2012-08-26 NOTE — Evaluation (Signed)
Speech Language Pathology Evaluation Patient Details Name: Donna Gilmore MRN: [id number] DOB: 02/27/1940 Today's Date: 08/26/2012 Time: 4098-1191 SLP Time Calculation (min): 26 min  Problem List:  Patient Active Problem List   Diagnosis Date Noted  . Benign paroxysmal positional vertigo 08/26/2012  . CVA (cerebral infarction) 08/25/2012  . Essential hypertension, benign 08/25/2012  . Dyslipidemia 08/25/2012   Past Medical History:  Past Medical History  Diagnosis Date  . Hypertension   . Dyslipidemia 08/25/2012  . Essential hypertension, benign 08/25/2012  . CVA (cerebral infarction) 08/25/2012  . Benign paroxysmal positional vertigo 08/26/2012   Past Surgical History: History reviewed. No pertinent past surgical history. HPI:  Donna Gilmore is a 72 y.o. female with Past medical history of hypertension who presents today with complain of dizziness that has been progressing since last 3 days. She says she was at her baseline onnsunday morning and around 6pm Sunday evening she started feeling dizzy and lightheadedness. Along with that she had a sense of feeling tired and since her tiredness was getting worse she decided to come to hospital.   Assessment / Plan / Recommendation Clinical Impression  Pt did not demonstrate difficulties with comprehension, expressive language, or cognition. Did demonstrate very mild dysarthria, but is intelligible. Pt does have hearing aids but not here at the hospital with her; speaking louder and slower is helpful. Pt is concerned about what happened and wants to learn more about the type of stroke that she had and whether she will be able to get back to her everyday life. Pt does not have acute speech therapy needs at this time, and speech will sign off. Future speech therapy is not recommended due to very mild nature of dysarthria.    SLP Assessment  Patient does not need any further Speech Lanaguage Pathology Services    Follow Up Recommendations       Frequency and Duration        Pertinent Vitals/Pain na   SLP Goals     SLP Evaluation Prior Functioning  Cognitive/Linguistic Baseline: Within functional limits Type of Home: House   Cognition  Overall Cognitive Status: Within Functional Limits for tasks assessed Arousal/Alertness: Awake/alert Orientation Level: Oriented X4 Attention: Focused;Sustained;Selective Focused Attention: Appears intact Sustained Attention: Appears intact Selective Attention: Appears intact Memory: Appears intact Awareness: Appears intact Problem Solving: Appears intact Executive Function: Reasoning;Sequencing Reasoning: Appears intact Sequencing: Appears intact Safety/Judgment: Appears intact    Comprehension  Auditory Comprehension Overall Auditory Comprehension: Appears within functional limits for tasks assessed Commands: Within Functional Limits Conversation: Complex Other Conversation Comments: Pt not wearing hearing aids and said she did not have them with her; this appears to be the main factor affecting her communication Interfering Components: Hearing EffectiveTechniques: Increased volume;Repetition;Slowed speech    Expression Expression Primary Mode of Expression: Verbal Verbal Expression Overall Verbal Expression: Appears within functional limits for tasks assessed Initiation: No impairment Level of Generative/Spontaneous Verbalization: Conversation Repetition: No impairment Naming: No impairment Pragmatics: No impairment Non-Verbal Means of Communication: Not applicable   Oral / Motor Motor Speech Overall Motor Speech: Impaired Respiration: Within functional limits Phonation: Normal Resonance: Within functional limits Articulation: Impaired Level of Impairment: Conversation Intelligibility: Intelligible (very mild dysarthria; does not affect intelligibility) Motor Planning: Witnin functional limits   GO     Donna Kluesner K, MA, CFY-SLP 08/26/2012, 10:12 AM

## 2012-08-26 NOTE — Progress Notes (Signed)
[facility] PROGRESS NOTE  Donna Gilmore ZOX:[id number] DOB: [birth year] DOA: 08/25/2012 PCP: Dorrene German, MD  Assessment/Plan: #1 acute CVA Stroke workup underway. Carotid Doppler is pending. 2-D echo pending. Patient has been started on Plavix. PT/OT. Will consult with neurology for further evaluation and management.  #2 hypertension Stable. Follow.  #3 hyperlipidemia Lipitor has been increased to 40 mg daily.  #4 history of vertigo Resume home regimen of meclizine.  #5 prophylaxis Lovenox for DVT prophylaxis.  Code Status: full Family Communication: updated patient and daughter Disposition Plan: pending PT evaluation   Consultants:  Neurology pending  Procedures:  MRI head 08/25/2012  Chest x-ray 08/26/2012  Antibiotics:  none  HPI/Subjective: Patient still with complaints of dizziness when going to the bathroom. No other complaints.  Objective: Filed Vitals:   08/26/12 0200 08/26/12 0400 08/26/12 0600 08/26/12 0751  BP: 133/56 130/54 129/84 133/79  Pulse: 53 50 56 59  Temp: 98 F (36.7 C) 97.8 F (36.6 C) 97.6 F (36.4 C) 97.8 F (36.6 C)  TempSrc: Oral Oral Oral Oral  Resp: 18 18 18 21   Height:      Weight:      SpO2: 100% 97% 94% 99%    Intake/Output Summary (Last 24 hours) at 08/26/12 0900 Last data filed at 08/26/12 0748  Gross per 24 hour  Intake    243 ml  Output      0 ml  Net    243 ml   Filed Weights   08/25/12 2200  Weight: 73.6 kg (162 lb 4.1 oz)    Exam:   General:  NAD  Cardiovascular: RRR  Respiratory: CTAB  Abdomen: soft, nontender, nondistended, positive bowel sounds.  Musculoskeletal: 4/5 BUE strength, 4/5 BLE strength  Data Reviewed: Basic Metabolic Panel:  Recent Labs Lab 08/25/12 1502 08/25/12 2310  NA 137  --   K 3.8  --   CL 103  --   CO2 22  --   GLUCOSE 133*  --   BUN 8  --   CREATININE 0.86 0.91  CALCIUM 9.7  --    Liver Function Tests: No results found for this basename: AST,  ALT, ALKPHOS, BILITOT, PROT, ALBUMIN,  in the last 168 hours No results found for this basename: LIPASE, AMYLASE,  in the last 168 hours No results found for this basename: AMMONIA,  in the last 168 hours CBC:  Recent Labs Lab 08/25/12 1502 08/25/12 2310  WBC 6.5 7.3  NEUTROABS 2.7  --   HGB 13.3 11.8*  HCT 39.9 35.4*  MCV 87.1 86.1  PLT 228 218   Cardiac Enzymes:  Recent Labs Lab 08/25/12 2310 08/26/12 0538  TROPONINI <0.30 <0.30   BNP (last 3 results) No results found for this basename: PROBNP,  in the last 8760 hours CBG:  Recent Labs Lab 08/25/12 2350 08/26/12 0631  GLUCAP 113* 100*    No results found for this or any previous visit (from the past 240 hour(s)).   Studies: Mr Brain Wo Contrast  08/25/2012   *RADIOLOGY REPORT*  Clinical Data: Vertigo of 2 weeks dizziness  MRI HEAD WITHOUT CONTRAST  Technique:  Multiplanar, multiecho pulse sequences of the brain and surrounding structures were obtained according to standard protocol without intravenous contrast.  Comparison: CT 07/22/2007  Findings: Small area of restricted diffusion left inferior cerebellum compatible with acute infarct measuring 5 x 7 mm. Additional sub centimeter area of acute infarct in the right superior cerebellum.  Ventricle size is normal.  Small areas  of chronic infarction in the cerebellum bilaterally.  Negative for hemorrhage or mass lesion.  No shift to the midline structures.  Paranasal sinuses are clear.  IMPRESSION: Small areas of acute and chronic infarct in the cerebellum bilaterally.   Original Report Authenticated By: Janeece Riggers, M.D.    Scheduled Meds: . amitriptyline  10 mg Oral QHS  . atorvastatin  40 mg Oral Daily  . clopidogrel  75 mg Oral Q breakfast  . heparin  5,000 Units Subcutaneous Q8H  . lisinopril  20 mg Oral Daily  . loratadine  10 mg Oral Daily  . meclizine  25 mg Oral BID  . olopatadine  1 drop Both Eyes BID  . ondansetron  4 mg Oral Once  . sodium chloride  3  mL Intravenous Q12H   Continuous Infusions:   Principal Problem:   CVA (cerebral infarction) Active Problems:   Essential hypertension, benign   Dyslipidemia   Benign paroxysmal positional vertigo    Time spent: > 35 mins    [facility]  [facility] Pager [phone]. If 7PM-7AM, please contact night-coverage at [email or web address], password [facility] 08/26/2012, 9:00 AM  LOS: 1 day

## 2012-08-26 NOTE — Evaluation (Signed)
Clinical/Bedside Swallow Evaluation Patient Details  Name: Donna Gilmore MRN: [id number] Date of Birth: [birth year]  Today's Date: 08/26/2012 Time: 4098-1191 SLP Time Calculation (min): 18 min  Past Medical History:  Past Medical History  Diagnosis Date  . Hypertension   . Dyslipidemia 08/25/2012  . Essential hypertension, benign 08/25/2012  . CVA (cerebral infarction) 08/25/2012  . Benign paroxysmal positional vertigo 08/26/2012   Past Surgical History: History reviewed. No pertinent past surgical history. HPI:  Donna Gilmore is a 72 y.o. female with Past medical history of hypertension who presents today with complain of dizziness that has been progressing since last 3 days. She says she was at her baseline onnsunday morning and around 6pm Sunday evening she started feeling dizzy and lightheadedness. Along with that she had a sense of feeling tired and since her tiredness was getting worse she decided to come to hospital. MRI revealed small areas of acute and chronic infarct in the cerebellum   Assessment / Plan / Recommendation Clinical Impression  Pt did not demonstrate s/s of aspiration at bedside; tolerated thin, puree, and solids well. Slight labial weakness on R side was noted but this did not appear to affect swallow function. Pt did report that she sometimes avoids dry solids due to dentures; however she did not demonstrate difficulties with cracker at bedside. Rx regular diet w/ thin liquids. Daughter reported that pt has not had any difficulties with getting meds down. No acute needs noted at this time; speech will sign off. Pt and daughter educated on s/s of aspiration to keep an eye out for and told to let the nurse or doctor know if frequent choking/ coughing during meals occurs.    Aspiration Risk  None    Diet Recommendation Regular;Thin liquid   Liquid Administration via: Cup;Straw Medication Administration: Whole meds with liquid Supervision: Patient able to self  feed Postural Changes and/or Swallow Maneuvers: Seated upright 90 degrees;Upright 30-60 min after meal    Other  Recommendations Oral Care Recommendations: Oral care BID   Follow Up Recommendations  None    Frequency and Duration        Pertinent Vitals/Pain na    SLP Swallow Goals     Swallow Study Prior Functional Status       General HPI: Donna Gilmore is a 72 y.o. female with Past medical history of hypertension who presents today with complain of dizziness that has been progressing since last 3 days. She says she was at her baseline onnsunday morning and around 6pm Sunday evening she started feeling dizzy and lightheadedness. Along with that she had a sense of feeling tired and since her tiredness was getting worse she decided to come to hospital. MRI revealed small areas of acute and chronic infarct in the cerebellum Type of Study: Bedside swallow evaluation Diet Prior to this Study: Regular;Thin liquids Temperature Spikes Noted: No Respiratory Status: Room air History of Recent Intubation: No Behavior/Cognition: Cooperative;Alert;Pleasant mood;Hard of hearing Oral Cavity - Dentition: Dentures, top;Dentures, bottom Self-Feeding Abilities: Able to feed self Patient Positioning: Upright in bed Baseline Vocal Quality: Clear Volitional Cough: Strong Volitional Swallow: Able to elicit    Oral/Motor/Sensory Function Labial ROM: Reduced right (mild) Labial Symmetry: Within Functional Limits Labial Strength:  (mild weakness noted on R side) Lingual ROM: Within Functional Limits Lingual Symmetry: Within Functional Limits Facial ROM: Within Functional Limits Facial Symmetry: Within Functional Limits Mandible: Within Functional Limits   Ice Chips Ice chips: Not tested   Thin Liquid Thin Liquid: Within  functional limits Presentation: Cup;Self Fed    Nectar Thick Nectar Thick Liquid: Not tested   Honey Thick Honey Thick Liquid: Not tested   Puree Puree: Within functional  limits   Solid   GO    Solid: Within functional limits Presentation: Self Fed Other Comments: Pt reported she sometimes avoids dry solids like cracker due to dentures       Channell Quattrone K, MA, CFY-SLP 08/26/2012,3:19 PM

## 2012-08-26 NOTE — Consult Note (Signed)
Referring Physician: Janee Morn    Chief Complaint: stroke with dizziness  HPI:                                                                                                                                         Donna Gilmore is an 72 y.o. female with know HTN and hyperlipidemia.  Patient is on ASA 81 mg daily and states she has not missed a dose.  2 weeks ago she noted she was "dizzy, weak and felt light headed.  This continued for 2 weeks".  On Monday 92 days ago) her dizziness became suddenly worse, prompting her to go to the ED.  Due to the long wait she left.  She returned to the hospital yesterday due to ongoing dizziness. She denies vertiginous sensation or room spinning.  She admits to feeling off balance but does not recall listing to one particular side. On admission her BP was 147/69 and most recent was 151/67.    Date last known well: Unable to determine Time last known well: Unable to determine tPA Given: No: out of window when arrived  Past Medical History  Diagnosis Date  . Hypertension   . Dyslipidemia 08/25/2012  . Essential hypertension, benign 08/25/2012  . CVA (cerebral infarction) 08/25/2012  . Benign paroxysmal positional vertigo 08/26/2012    History reviewed. No pertinent past surgical history.  Family history. Mother: HTN, hyperlipidemia Father: HTN  Social History:  reports that she has quit smoking. She does not have any smokeless tobacco history on file. She reports that she does not drink alcohol or use illicit drugs.  Allergies: No Known Allergies  Medications:                                                                                                                           Prior to Admission:  Prescriptions prior to admission  Medication Sig Dispense Refill  . Alcaftadine (LASTACAFT) 0.25 % SOLN Apply 1 drop to eye every evening. One drop in each eye      . amitriptyline (ELAVIL) 10 MG tablet Take 10 mg by mouth at bedtime.      Marland Kitchen aspirin EC  81 MG tablet Take 81 mg by mouth daily.      Marland Kitchen atorvastatin (LIPITOR) 20 MG tablet Take 20 mg by mouth daily.      . cetirizine (ZYRTEC)  10 MG tablet Take 10 mg by mouth daily.      Marland Kitchen lisinopril (PRINIVIL,ZESTRIL) 20 MG tablet Take 20 mg by mouth daily.      . meclizine (ANTIVERT) 25 MG tablet Take 25 mg by mouth 2 (two) times daily.       . vitamin C (ASCORBIC ACID) 500 MG tablet Take 500 mg by mouth daily.       Scheduled: . amitriptyline  10 mg Oral QHS  . atorvastatin  40 mg Oral Daily  . clopidogrel  75 mg Oral Q breakfast  . heparin  5,000 Units Subcutaneous Q8H  . lisinopril  20 mg Oral Daily  . loratadine  10 mg Oral Daily  . meclizine  25 mg Oral BID  . olopatadine  1 drop Both Eyes BID  . ondansetron  4 mg Oral Once  . sodium chloride  3 mL Intravenous Q12H    ROS:                                                                                                                                       History obtained from the patient  General ROS: negative for - chills, fatigue, fever, night sweats, weight gain or weight loss Psychological ROS: negative for - behavioral disorder, hallucinations, memory difficulties, mood swings or suicidal ideation Ophthalmic ROS: negative for - blurry vision, double vision, eye pain or loss of vision ENT ROS: positive for -dizziness Allergy and Immunology ROS: negative for - hives or itchy/watery eyes Hematological and Lymphatic ROS: negative for - bleeding problems, bruising or swollen lymph nodes Endocrine ROS: negative for - galactorrhea, hair pattern changes, polydipsia/polyuria or temperature intolerance Respiratory ROS: negative for - cough, hemoptysis, shortness of breath or wheezing Cardiovascular ROS: negative for - chest pain, dyspnea on exertion, edema or irregular heartbeat Gastrointestinal ROS: negative for - abdominal pain, diarrhea, hematemesis, nausea/vomiting or stool incontinence Genito-Urinary ROS: negative for - dysuria,  hematuria, incontinence or urinary frequency/urgency Musculoskeletal ROS: negative for - joint swelling or muscular weakness Neurological ROS: as noted in HPI Dermatological ROS: negative for rash and skin lesion changes  Neurologic Examination:                                                                                                      Blood pressure 151/67, pulse 58, temperature 98.4 F (36.9 C), temperature source Oral, resp. rate 20, height 5\' 6"  (1.676 m), weight 73.6 kg (162 lb 4.1 oz), SpO2 100.00%.  Mental Status:  Alert, oriented, thought content appropriate.  Speech fluent without evidence of aphasia.  Able to follow 3 step commands without difficulty. Cranial Nerves: II: Discs flat bilaterally; Visual fields grossly normal, pupils equal, round, reactive to light and accommodation III,IV, VI: ptosis not present, extra-ocular motions intact bilaterally V,VII: smile symmetric, facial light touch sensation normal bilaterally VIII: hearing significantly decreased bilaterally IX,X: gag reflex present XI: bilateral shoulder shrug XII: midline tongue extension Motor: Right : Upper extremity   5/5    Left:     Upper extremity   5/5  Lower extremity   5/5     Lower extremity   5/5 Tone and bulk:normal tone throughout; no atrophy noted Sensory: Pinprick and light touch intact throughout, bilaterally Deep Tendon Reflexes:  Right: Upper Extremity   Left: Upper extremity   biceps (C-5 to C-6) 2/4   biceps (C-5 to C-6) 2/4 tricep (C7) 2/4    triceps (C7) 2/4 Brachioradialis (C6) 2/4  Brachioradialis (C6) 2/4  Lower Extremity Lower Extremity  quadriceps (L-2 to L-4) 2/4   quadriceps (L-2 to L-4) 2/4 Achilles (S1) 0/4   Achilles (S1) 0/4  Plantars: Right: downgoing   Left: downgoing Cerebellar: normal finger-to-nose,  normal heel-to-shin test CV: pulses palpable throughout    Results for orders placed during the hospital encounter of 08/25/12 (from the past 48 hour(s))   CBC WITH DIFFERENTIAL     Status: Abnormal   Collection Time    08/25/12  3:02 PM      Result Value Range   WBC 6.5  4.0 - 10.5 K/uL   RBC 4.58  3.87 - 5.11 MIL/uL   Hemoglobin 13.3  12.0 - 15.0 g/dL   HCT 47.8  29.5 - 62.1 %   MCV 87.1  78.0 - 100.0 fL   MCH 29.0  26.0 - 34.0 pg   MCHC 33.3  30.0 - 36.0 g/dL   RDW 30.8  65.7 - 84.6 %   Platelets 228  150 - 400 K/uL   Neutrophils Relative % 41 (*) 43 - 77 %   Neutro Abs 2.7  1.7 - 7.7 K/uL   Lymphocytes Relative 50 (*) 12 - 46 %   Lymphs Abs 3.3  0.7 - 4.0 K/uL   Monocytes Relative 5  3 - 12 %   Monocytes Absolute 0.3  0.1 - 1.0 K/uL   Eosinophils Relative 4  0 - 5 %   Eosinophils Absolute 0.3  0.0 - 0.7 K/uL   Basophils Relative 1  0 - 1 %   Basophils Absolute 0.1  0.0 - 0.1 K/uL  BASIC METABOLIC PANEL     Status: Abnormal   Collection Time    08/25/12  3:02 PM      Result Value Range   Sodium 137  135 - 145 mEq/L   Potassium 3.8  3.5 - 5.1 mEq/L   Chloride 103  96 - 112 mEq/L   CO2 22  19 - 32 mEq/L   Glucose, Bld 133 (*) 70 - 99 mg/dL   BUN 8  6 - 23 mg/dL   Creatinine, Ser 9.62  0.50 - 1.10 mg/dL   Calcium 9.7  8.4 - 95.2 mg/dL   GFR calc non Af Amer 66 (*) >90 mL/min   GFR calc Af Amer 76 (*) >90 mL/min   Comment:            The eGFR has been calculated     using the CKD EPI equation.     This  calculation has not been     validated in all clinical     situations.     eGFR's persistently     <90 mL/min signify     possible Chronic Kidney Disease.  POCT I-STAT TROPONIN I     Status: None   Collection Time    08/25/12  3:17 PM      Result Value Range   Troponin i, poc 0.01  0.00 - 0.08 ng/mL   Comment 3            Comment: Due to the release kinetics of cTnI,     a negative result within the first hours     of the onset of symptoms does not rule out     myocardial infarction with certainty.     If myocardial infarction is still suspected,     repeat the test at appropriate intervals.  URINALYSIS, ROUTINE  W REFLEX MICROSCOPIC     Status: Abnormal   Collection Time    08/25/12  4:07 PM      Result Value Range   Color, Urine YELLOW  YELLOW   APPearance CLEAR  CLEAR   Specific Gravity, Urine 1.004 (*) 1.005 - 1.030   pH 6.5  5.0 - 8.0   Glucose, UA NEGATIVE  NEGATIVE mg/dL   Hgb urine dipstick SMALL (*) NEGATIVE   Bilirubin Urine NEGATIVE  NEGATIVE   Ketones, ur NEGATIVE  NEGATIVE mg/dL   Protein, ur NEGATIVE  NEGATIVE mg/dL   Urobilinogen, UA 0.2  0.0 - 1.0 mg/dL   Nitrite NEGATIVE  NEGATIVE   Leukocytes, UA NEGATIVE  NEGATIVE  URINE MICROSCOPIC-ADD ON     Status: None   Collection Time    08/25/12  4:07 PM      Result Value Range   Squamous Epithelial / LPF RARE  RARE   WBC, UA 0-2  <3 WBC/hpf   RBC / HPF 0-2  <3 RBC/hpf   Bacteria, UA RARE  RARE  URINE RAPID DRUG SCREEN (HOSP PERFORMED)     Status: None   Collection Time    08/25/12  4:07 PM      Result Value Range   Opiates NONE DETECTED  NONE DETECTED   Cocaine NONE DETECTED  NONE DETECTED   Benzodiazepines NONE DETECTED  NONE DETECTED   Amphetamines NONE DETECTED  NONE DETECTED   Tetrahydrocannabinol NONE DETECTED  NONE DETECTED   Barbiturates NONE DETECTED  NONE DETECTED   Comment:            DRUG SCREEN FOR MEDICAL PURPOSES     ONLY.  IF CONFIRMATION IS NEEDED     FOR ANY PURPOSE, NOTIFY LAB     WITHIN 5 DAYS.                LOWEST DETECTABLE LIMITS     FOR URINE DRUG SCREEN     Drug Class       Cutoff (ng/mL)     Amphetamine      1000     Barbiturate      200     Benzodiazepine   200     Tricyclics       300     Opiates          300     Cocaine          300     THC              50  CBC  Status: Abnormal   Collection Time    08/25/12 11:10 PM      Result Value Range   WBC 7.3  4.0 - 10.5 K/uL   RBC 4.11  3.87 - 5.11 MIL/uL   Hemoglobin 11.8 (*) 12.0 - 15.0 g/dL   HCT 16.1 (*) 09.6 - 04.5 %   MCV 86.1  78.0 - 100.0 fL   MCH 28.7  26.0 - 34.0 pg   MCHC 33.3  30.0 - 36.0 g/dL   RDW 40.9  81.1 - 91.4 %    Platelets 218  150 - 400 K/uL  CREATININE, SERUM     Status: Abnormal   Collection Time    08/25/12 11:10 PM      Result Value Range   Creatinine, Ser 0.91  0.50 - 1.10 mg/dL   GFR calc non Af Amer 62 (*) >90 mL/min   GFR calc Af Amer 71 (*) >90 mL/min   Comment:            The eGFR has been calculated     using the CKD EPI equation.     This calculation has not been     validated in all clinical     situations.     eGFR's persistently     <90 mL/min signify     possible Chronic Kidney Disease.  TROPONIN I     Status: None   Collection Time    08/25/12 11:10 PM      Result Value Range   Troponin I <0.30  <0.30 ng/mL   Comment:            Due to the release kinetics of cTnI,     a negative result within the first hours     of the onset of symptoms does not rule out     myocardial infarction with certainty.     If myocardial infarction is still suspected,     repeat the test at appropriate intervals.  GLUCOSE, CAPILLARY     Status: Abnormal   Collection Time    08/25/12 11:50 PM      Result Value Range   Glucose-Capillary 113 (*) 70 - 99 mg/dL   Comment 1 Documented in Chart     Comment 2 Notify RN    HEMOGLOBIN A1C     Status: Abnormal   Collection Time    08/26/12  5:38 AM      Result Value Range   Hemoglobin A1C 6.0 (*) <5.7 %   Comment: (NOTE)                                                                               According to the ADA Clinical Practice Recommendations for 2011, when     HbA1c is used as a screening test:      >=6.5%   Diagnostic of Diabetes Mellitus               (if abnormal result is confirmed)     5.7-6.4%   Increased risk of developing Diabetes Mellitus     References:Diagnosis and Classification of Diabetes Mellitus,Diabetes     Care,2011,34(Suppl 1):S62-S69 and Standards of Medical Care in  Diabetes - 2011,Diabetes Care,2011,34 (Suppl 1):S11-S61.   Mean Plasma Glucose 126 (*) <117 mg/dL  TROPONIN I     Status: None    Collection Time    08/26/12  5:38 AM      Result Value Range   Troponin I <0.30  <0.30 ng/mL   Comment:            Due to the release kinetics of cTnI,     a negative result within the first hours     of the onset of symptoms does not rule out     myocardial infarction with certainty.     If myocardial infarction is still suspected,     repeat the test at appropriate intervals.  LIPID PANEL     Status: Abnormal   Collection Time    08/26/12  5:38 AM      Result Value Range   Cholesterol 271 (*) 0 - 200 mg/dL   Triglycerides 161 (*) <150 mg/dL   HDL 31 (*) >09 mg/dL   Total CHOL/HDL Ratio 8.7     VLDL 60 (*) 0 - 40 mg/dL   LDL Cholesterol 604 (*) 0 - 99 mg/dL   Comment:            Total Cholesterol/HDL:CHD Risk     Coronary Heart Disease Risk Table                         Men   Women      1/2 Average Risk   3.4   3.3      Average Risk       5.0   4.4      2 X Average Risk   9.6   7.1      3 X Average Risk  23.4   11.0                Use the calculated Patient Ratio     above and the CHD Risk Table     to determine the patient's CHD Risk.                ATP III CLASSIFICATION (LDL):      <100     mg/dL   Optimal      540-981  mg/dL   Near or Above                        Optimal      130-159  mg/dL   Borderline      191-478  mg/dL   High      >295     mg/dL   Very High  GLUCOSE, CAPILLARY     Status: Abnormal   Collection Time    08/26/12  6:31 AM      Result Value Range   Glucose-Capillary 100 (*) 70 - 99 mg/dL   Comment 1 Documented in Chart     Comment 2 Notify RN    CBC WITH DIFFERENTIAL     Status: None   Collection Time    08/26/12 11:20 AM      Result Value Range   WBC 5.9  4.0 - 10.5 K/uL   RBC 4.30  3.87 - 5.11 MIL/uL   Hemoglobin 12.8  12.0 - 15.0 g/dL   HCT 62.1  30.8 - 65.7 %   MCV 86.3  78.0 - 100.0 fL   MCH 29.8  26.0 - 34.0 pg  MCHC 34.5  30.0 - 36.0 g/dL   RDW 09.8  11.9 - 14.7 %   Platelets 210  150 - 400 K/uL   Neutrophils Relative % 44  43  - 77 %   Neutro Abs 2.6  1.7 - 7.7 K/uL   Lymphocytes Relative 46  12 - 46 %   Lymphs Abs 2.7  0.7 - 4.0 K/uL   Monocytes Relative 6  3 - 12 %   Monocytes Absolute 0.4  0.1 - 1.0 K/uL   Eosinophils Relative 4  0 - 5 %   Eosinophils Absolute 0.2  0.0 - 0.7 K/uL   Basophils Relative 1  0 - 1 %   Basophils Absolute 0.0  0.0 - 0.1 K/uL   Dg Chest 2 View  08/26/2012   *RADIOLOGY REPORT*  Clinical Data: Stroke, weakness  CHEST - 2 VIEW  Comparison: 12/04/2004 CT scan and 12/04/2004 chest radiograph  Findings: Heart size and vascular pattern are normal.  Lungs are clear.  No change from prior studies.  IMPRESSION: No acute findings   Original Report Authenticated By: Esperanza Heir, M.D.   Mr Brain Wo Contrast  08/25/2012   *RADIOLOGY REPORT*  Clinical Data: Vertigo of 2 weeks dizziness  MRI HEAD WITHOUT CONTRAST  Technique:  Multiplanar, multiecho pulse sequences of the brain and surrounding structures were obtained according to standard protocol without intravenous contrast.  Comparison: CT 07/22/2007  Findings: Small area of restricted diffusion left inferior cerebellum compatible with acute infarct measuring 5 x 7 mm. Additional sub centimeter area of acute infarct in the right superior cerebellum.  Ventricle size is normal.  Small areas of chronic infarction in the cerebellum bilaterally.  Negative for hemorrhage or mass lesion.  No shift to the midline structures.  Paranasal sinuses are clear.  IMPRESSION: Small areas of acute and chronic infarct in the cerebellum bilaterally.   Original Report Authenticated By: Janeece Riggers, M.D.   A1c--6.0 LDL --180   Cholesterol 271  Triglycerides 301  HDL 31   LDL  180 VLDL 60    Felicie Morn PA-C [facility] [phone]  08/26/2012, 11:39 AM   Patient seen and examined.  Clinical course and management discussed.  Necessary edits performed.  I agree with the above.  Assessment and plan of care developed and discussed below.     Assessment: 72 y.o. female presenting with dizziness.   MRI reviewed and shows bilateral small acute cerebellar infarcts.  A1c and LDL elevated.  Patient on ASA and a statin at home.  Full work up recommended.      Stroke Risk Factors - hyperlipidemia and hypertension  1. Patient claustrophobic.  Recommend CTA of head and neck.   2. PT consult, OT consult 3. Echocardiogram 4. Prophylactic therapy-Antiplatelet med: Plavix - dose 75 mg daily 5. Risk factor modification 6. Telemetry monitoring 7. Frequent neuro checks  Thana Farr, MD [facility] [phone]  08/26/2012  1:09 PM

## 2012-08-27 ENCOUNTER — Inpatient Hospital Stay (HOSPITAL_COMMUNITY): Payer: Medicare Other

## 2012-08-27 LAB — BASIC METABOLIC PANEL
CO2: 25 mEq/L (ref 19–32)
Calcium: 9.2 mg/dL (ref 8.4–10.5)
GFR calc non Af Amer: 61 mL/min — ABNORMAL LOW (ref >90)
Glucose, Bld: 97 mg/dL (ref 70–99)
Potassium: 3.7 mEq/L (ref 3.5–5.1)
Sodium: 138 mEq/L (ref 135–145)

## 2012-08-27 LAB — GLUCOSE, CAPILLARY: Glucose-Capillary: 76 mg/dL (ref 70–99)

## 2012-08-27 LAB — TROPONIN I: Troponin I: <0.3 ng/mL (ref <0.30)

## 2012-08-27 MED ORDER — ACETAMINOPHEN 325 MG PO TABS
650.0000 mg | ORAL_TABLET | Freq: Four times a day (QID) | ORAL | Status: DC | PRN
  Administered 2012-08-27 – 2012-08-28 (×2): 650 mg via ORAL
  Filled 2012-08-27 (×2): qty 2

## 2012-08-27 MED ORDER — GADOBENATE DIMEGLUMINE 529 MG/ML IV SOLN
15.0000 mL | Freq: Once | INTRAVENOUS | Status: AC
  Administered 2012-08-27: 15 mL via INTRAVENOUS

## 2012-08-27 NOTE — Care Management Note (Unsigned)
    Page 1 of 2   08/28/2012     11:52:30 AM   CARE MANAGEMENT NOTE 08/28/2012  Patient:  Donna Gilmore, Donna Gilmore   Account Number:  [id number]  Date Initiated:  08/27/2012  Documentation initiated by:  Elmer Bales  Subjective/Objective Assessment:   Pt was admitted for CVA. Pt lives at home with family     Action/Plan:   Will follow for discharge needs.   Anticipated DC Date:  08/27/2012   Anticipated DC Plan:  HOME W HOME HEALTH SERVICES      DC Planning Services  CM consult      Choice offered to / List presented to:  C-4 Adult Children        HH arranged  HH-2 PT  HH-3 OT      [facility] agency  [facility]   Status of service:  Completed, signed off Medicare Important Message given?   (If response is "NO", the following Medicare IM given date fields will be blank) Date Medicare IM given:   Date Additional Medicare IM given:    Discharge Disposition:    Per UR Regulation:  Reviewed for med. necessity/level of care/duration of stay  If discussed at Long Length of Stay Meetings, dates discussed:    Comments:  08/28/12 1145 Elmer Bales RN, MSN, CM- Spoke with patient and family regarding home health agency choice. Pt and family have chosen to use [facility]. Mary with [facility] was notified and has accepted the referral.   08/27/12 1600 Elmer Bales RN, MSN, CM- Met with patient and family to discuss home health needs.  Pt lives with family, including her elderly mother.  There is a caregiver in the home.  Pt and children were presented with a home health agency list, but would like to have some time to research options. CM will meet with family again tomorrow to discuss.

## 2012-08-27 NOTE — Evaluation (Signed)
Occupational Therapy Evaluation Patient Details Name: Donna Gilmore MRN: [id number] DOB: 16-Feb-1940 Today's Date: 08/27/2012 Time: 4098-1191 OT Time Calculation (min): 24 min  OT Assessment / Plan / Recommendation History of present illness   72 y.o. female with hx of HTN who presents today with complain of dizziness that has been progressing since last 3 days. She says she was at her baseline on Sunday morning and around 6pm Sunday evening she started feeling dizzy and lightheadedness. MRI + BIL cerebellum infarcts    Clinical Impression   PT admitted with dizziness. Pt currently with functional limitiations due to the deficits listed below (see OT problem list). Pt with balance deficits. Pt will benefit from skilled OT to increase their independence and safety with adls and balance to allow discharge HHOT.     OT Assessment       Follow Up Recommendations  Home health OT    Barriers to Discharge      Equipment Recommendations  None recommended by OT    Recommendations for Other Services    Frequency       Precautions / Restrictions Precautions Precautions: Fall   Pertinent Vitals/Pain No pain     ADL  Eating/Feeding: Modified independent Where Assessed - Eating/Feeding: Bed level Grooming: Wash/dry hands;Wash/dry face;Teeth care;Supervision/safety Where Assessed - Grooming: Unsupported standing Upper Body Bathing: Chest;Right arm;Left arm;Abdomen;Supervision/safety Where Assessed - Upper Body Bathing: Unsupported sit to stand Lower Body Bathing: Min guard Where Assessed - Lower Body Bathing: Unsupported sit to stand Upper Body Dressing: Supervision/safety Where Assessed - Upper Body Dressing: Unsupported sitting Lower Body Dressing: Min guard Where Assessed - Lower Body Dressing: Unsupported sit to stand Toilet Transfer: Supervision/safety Toilet Transfer Method: Sit to Barista: Regular height toilet Tub/Shower Transfer: Min  guard Tub/Shower Transfer Method: Science writer: Counsellor Used: Gait belt Transfers/Ambulation Related to ADLs: Pt ambulating with decr gait velocity and occassionally placing right UE on wall rails . pt encouraged to ambulate without wall assistance. Pt stopping during conversational challenges. pt verbalized previous therapy for vestibular at [facility] ADL Comments: Pt completed bed mobility and required sitting eob for prolonged period prior to standing. Pt standind for prolonged period before ambulating. pt ambulated to gym to complete tub transfer. Pt was able to do so min guard. Pt ambulating short distance on unit to assess ambulation within house hold for adls. pt demonstrates balance deficits with change in speed, stopping with challenges and holding side rail in hallway. Daughter and son present during session. Family requesting information on home health. CM Courtney notified of changes in therapy recommendations.    OT Diagnosis:    OT Problem List:   OT Treatment Interventions:     OT Goals(Current goals can be found in the care plan section) Acute Rehab OT Goals Patient Stated Goal: to return home and (A) with elderly mothers care OT Goal Formulation: With patient Time For Goal Achievement: 09/10/12 Potential to Achieve Goals: Good  Visit Information  Last OT Received On: 08/27/12 Assistance Needed: +1 History of Present Illness:   72 y.o. female with hx of HTN who presents today with complain of dizziness that has been progressing since last 3 days. She says she was at her baseline on Sunday morning and around 6pm Sunday evening she started feeling dizzy and lightheadedness. MRI + BIL cerebellum infarcts        Prior Functioning     Home Living Family/patient expects to be discharged to:: Private  residence Living Arrangements: Other (Comment);Parent (caregiver for elderly mother) Available Help at Discharge:  Family;Other (Comment) (caregiver for mother) Type of Home: House Home Access: Other (comment);Ramped entrance Home Layout: One level Home Equipment: Walker - 2 wheels;Cane - single point;Wheelchair - manual;Tub bench Prior Function Level of Independence: Independent Communication Communication: No difficulties;HOH (wears hearing aides) Dominant Hand: Right         Vision/Perception Vision - History Baseline Vision: Wears glasses only for reading Patient Visual Report: No change from baseline   Cognition  Cognition Arousal/Alertness: Awake/alert Behavior During Therapy: WFL for tasks assessed/performed Overall Cognitive Status: Within Functional Limits for tasks assessed    Extremity/Trunk Assessment Upper Extremity Assessment Upper Extremity Assessment: Overall WFL for tasks assessed Lower Extremity Assessment Lower Extremity Assessment: Defer to PT evaluation Cervical / Trunk Assessment Cervical / Trunk Assessment: Normal     Mobility Bed Mobility Bed Mobility: Supine to Sit;Sitting - Scoot to Edge of Bed;Sit to Supine Supine to Sit: 6: Modified independent (Device/Increase time);HOB flat Sitting - Scoot to Edge of Bed: 6: Modified independent (Device/Increase time) Details for Bed Mobility Assistance: incr time but able to complete without (A) Transfers Sit to Stand: 5: Supervision;With upper extremity assist;From bed Stand to Sit: 5: Supervision;With upper extremity assist;To chair/3-in-1     Exercise     Balance     End of Session    GO     Harrel Carina [facility] 08/27/2012, 2:20 PM Pager: [phone]

## 2012-08-27 NOTE — Progress Notes (Signed)
  Echocardiogram 2D Echocardiogram has been performed.  Donna Gilmore 08/27/2012, 11:29 AM

## 2012-08-27 NOTE — Progress Notes (Signed)
[facility] PROGRESS NOTE  Donna Gilmore WUJ:[id number] DOB: Dec 24, 1940 DOA: 08/25/2012 PCP: Dorrene German, MD  Assessment/Plan: #1 acute CVA Stroke workup underway. Carotid Doppler is pending. 2-D echo with EF of 60-65%. With no source of emboli. Continue Plavix. PT/OT.  Risk factor modific do . Neuro ff.  #2 hypertension Stable. Follow.  #3 hyperlipidemia Continue lipitor.  #4 history of vertigo Continue meclizine.  #5 prophylaxis Lovenox for DVT prophylaxis.  Code Status: full Family Communication: updated patient and daughter Disposition Plan: Home when medically stable.   Consultants:  Neurology: Dr Thad Ranger 08/26/12  Procedures:  MRI head 08/25/2012  Chest x-ray 08/26/2012   2 D echo 08/27/12  Antibiotics:  none  HPI/Subjective: Patient still with complaints of dizziness. No other complaints.  Objective: Filed Vitals:   08/26/12 2127 08/27/12 0226 08/27/12 0602 08/27/12 1505  BP: 149/61 139/64 119/58 139/64  Pulse: 55 57 54 58  Temp:  98.1 F (36.7 C) 97.4 F (36.3 C) 98.5 F (36.9 C)  TempSrc: Oral Oral Oral Oral  Resp: 20 20 20 20   Height:      Weight:      SpO2: 100% 99% 99% 100%   No intake or output data in the 24 hours ending 08/27/12 1813 Filed Weights   08/25/12 2200  Weight: 73.6 kg (162 lb 4.1 oz)    Exam:   General:  NAD  Cardiovascular: RRR  Respiratory: CTAB  Abdomen: soft, nontender, nondistended, positive bowel sounds.  Musculoskeletal: 4/5 BUE strength, 4/5 BLE strength  Data Reviewed: Basic Metabolic Panel:  Recent Labs Lab 08/25/12 1502 08/25/12 2310 08/26/12 1120 08/27/12 0538  NA 137  --  137 138  K 3.8  --  4.3 3.7  CL 103  --  104 105  CO2 22  --  23 25  GLUCOSE 133*  --  93 97  BUN 8  --  11 9  CREATININE 0.86 0.91 0.93 0.92  CALCIUM 9.7  --  9.5 9.2   Liver Function Tests:  Recent Labs Lab 08/26/12 1120  AST 20  ALT 12  ALKPHOS 104  BILITOT 0.3  PROT 6.7  ALBUMIN 3.3*    No results found for this basename: LIPASE, AMYLASE,  in the last 168 hours No results found for this basename: AMMONIA,  in the last 168 hours CBC:  Recent Labs Lab 08/25/12 1502 08/25/12 2310 08/26/12 1120  WBC 6.5 7.3 5.9  NEUTROABS 2.7  --  2.6  HGB 13.3 11.8* 12.8  HCT 39.9 35.4* 37.1  MCV 87.1 86.1 86.3  PLT 228 218 210   Cardiac Enzymes:  Recent Labs Lab 08/25/12 2310 08/26/12 0538 08/26/12 1120 08/26/12 1642 08/26/12 2317  TROPONINI <0.30 <0.30 <0.30 <0.30 <0.30   BNP (last 3 results) No results found for this basename: PROBNP,  in the last 8760 hours CBG:  Recent Labs Lab 08/26/12 1641 08/26/12 2325 08/27/12 0640 08/27/12 1149 08/27/12 1703  GLUCAP 99 106* 99 85 76    No results found for this or any previous visit (from the past 240 hour(s)).   Studies: Dg Chest 2 View  08/26/2012   *RADIOLOGY REPORT*  Clinical Data: Stroke, weakness  CHEST - 2 VIEW  Comparison: 12/04/2004 CT scan and 12/04/2004 chest radiograph  Findings: Heart size and vascular pattern are normal.  Lungs are clear.  No change from prior studies.  IMPRESSION: No acute findings   Original Report Authenticated By: Esperanza Heir, M.D.   Mr [facility] Wo Contrast  08/27/2012   *  RADIOLOGY REPORT*  Clinical Data:  Punctate cerebellar infarcts.  MRA HEAD WITHOUT CONTRAST  Technique:  Angiographic images of the Circle of Willis were obtained using MRA technique without intravenous contrast.  Comparison:  MRI brain 08/25/2012.  Findings:  Mild atherosclerotic irregularities present in the cavernous carotid arteries bilaterally without to a significant stenosis.  Prominent posterior communicating arteries are present bilaterally.  The there is a moderate stenosis in the proximal right A1 segment.  The left A1 segment is patent.  The M1 segments are normal bilaterally.  The anterior communicating artery is patent.  The MCA bifurcations are intact but there is significant branch vessel attenuation  bilaterally, right greater than left.  There is a severe stenosis of the distal right vertebral artery proximal to the PICA.  To the left vertebral artery is hypoplastic with segmental stenoses and no significant flow beyond the skull base.  The proximal basilar artery demonstrates no flow.  There is minimal flow and a small distal basilar artery.  Both posterior cerebral arteries are of fetal type with some attenuation of distal branch vessels.  IMPRESSION:  1.  Severe vertebral basilar disease without significant flow signal identified in the proximal basilar artery. 2.  High-grade distal right vertebral artery stenosis. 3.  Occlusion of the nondominant left vertebral artery. 4.  Moderate small vessel disease within the anterior circulation.  *RADIOLOGY REPORT*  MRI NECK WITHOUT AND WITH CONTRAST  Technique:  Multiplanar, multiecho pulse sequences of the neck and surrounding structures were obtained without and with intravenous contrast.  Contrast: 15mL MULTIHANCE GADOBENATE DIMEGLUMINE 529 MG/ML IV SOLN  Findings:  The time-of-flight images demonstrates moderate tortuosity of the proximal internal carotid arteries bilaterally without significant flow disturbance.  Antegrade flow is present in the right vertebral artery.  There is a common origin of the left common carotid artery and the innominate artery, a normal variant.  Focal signal loss at the origin of the right vertebral artery suggests a high-grade stenosis.  The there is segmental opacification of the left vertebral artery without significant signal proximally.  This suggests proximal occlusion with reconstitution via muscular branches.  The vessel terminates at the left PICA.  A high-grade stenosis of the distal right vertebral artery, proximal to the PICA is confirmed.  Moderate to severe stenoses are present proximally within a hypoplastic basilar artery.  The right common carotid artery is within normal limits.  The bifurcation demonstrates minimal  atherosclerotic change without significant stenosis.  There is tortuosity of the proximal right internal carotid artery without a significant stenosis.  A fetal type right posterior cerebral artery is present.  The left common carotid artery is tortuous proximally.  Mild atherosclerotic irregularity is present at the carotid bifurcation without a significant stenosis.  There is tortuosity within the proximal left internal carotid artery as well.  No significant ICA stenosis is present.  A fetal type left posterior cerebral artery is present.  IMPRESSION:  1.  High-grade stenosis of the right vertebral artery origin, the dominant vessel. 2.  High-grade stenosis of the distal right vertebral artery, just proximal to the PICA. 3.  Occlusion of the left vertebral artery with faint reconstitution likely by musculoskeletal branches. 4.  Segmental stenoses of the residual left vertebral artery without to signal beyond the PICA.  This may represent a congenitally small vessel which terminates at the PICA versus a distal occlusion. 5.  Tortuous common carotid arteries and internal carotid arteries without significant stenoses in the anterior circulation. 6.  Fetal type posterior  cerebral arteries bilaterally. 7. Moderate to severe stenoses within the proximal hypoplastic basilar artery.   Original Report Authenticated By: Marin Roberts, M.D.   Mr Angiogram Neck W Wo Contrast  08/27/2012   *RADIOLOGY REPORT*  Clinical Data:  Punctate cerebellar infarcts.  MRA HEAD WITHOUT CONTRAST  Technique:  Angiographic images of the Circle of Willis were obtained using MRA technique without intravenous contrast.  Comparison:  MRI brain 08/25/2012.  Findings:  Mild atherosclerotic irregularities present in the cavernous carotid arteries bilaterally without to a significant stenosis.  Prominent posterior communicating arteries are present bilaterally.  The there is a moderate stenosis in the proximal right A1 segment.  The left A1  segment is patent.  The M1 segments are normal bilaterally.  The anterior communicating artery is patent.  The MCA bifurcations are intact but there is significant branch vessel attenuation bilaterally, right greater than left.  There is a severe stenosis of the distal right vertebral artery proximal to the PICA.  To the left vertebral artery is hypoplastic with segmental stenoses and no significant flow beyond the skull base.  The proximal basilar artery demonstrates no flow.  There is minimal flow and a small distal basilar artery.  Both posterior cerebral arteries are of fetal type with some attenuation of distal branch vessels.  IMPRESSION:  1.  Severe vertebral basilar disease without significant flow signal identified in the proximal basilar artery. 2.  High-grade distal right vertebral artery stenosis. 3.  Occlusion of the nondominant left vertebral artery. 4.  Moderate small vessel disease within the anterior circulation.  *RADIOLOGY REPORT*  MRI NECK WITHOUT AND WITH CONTRAST  Technique:  Multiplanar, multiecho pulse sequences of the neck and surrounding structures were obtained without and with intravenous contrast.  Contrast: 15mL MULTIHANCE GADOBENATE DIMEGLUMINE 529 MG/ML IV SOLN  Findings:  The time-of-flight images demonstrates moderate tortuosity of the proximal internal carotid arteries bilaterally without significant flow disturbance.  Antegrade flow is present in the right vertebral artery.  There is a common origin of the left common carotid artery and the innominate artery, a normal variant.  Focal signal loss at the origin of the right vertebral artery suggests a high-grade stenosis.  The there is segmental opacification of the left vertebral artery without significant signal proximally.  This suggests proximal occlusion with reconstitution via muscular branches.  The vessel terminates at the left PICA.  A high-grade stenosis of the distal right vertebral artery, proximal to the PICA is  confirmed.  Moderate to severe stenoses are present proximally within a hypoplastic basilar artery.  The right common carotid artery is within normal limits.  The bifurcation demonstrates minimal atherosclerotic change without significant stenosis.  There is tortuosity of the proximal right internal carotid artery without a significant stenosis.  A fetal type right posterior cerebral artery is present.  The left common carotid artery is tortuous proximally.  Mild atherosclerotic irregularity is present at the carotid bifurcation without a significant stenosis.  There is tortuosity within the proximal left internal carotid artery as well.  No significant ICA stenosis is present.  A fetal type left posterior cerebral artery is present.  IMPRESSION:  1.  High-grade stenosis of the right vertebral artery origin, the dominant vessel. 2.  High-grade stenosis of the distal right vertebral artery, just proximal to the PICA. 3.  Occlusion of the left vertebral artery with faint reconstitution likely by musculoskeletal branches. 4.  Segmental stenoses of the residual left vertebral artery without to signal beyond the PICA.  This may represent  a congenitally small vessel which terminates at the PICA versus a distal occlusion. 5.  Tortuous common carotid arteries and internal carotid arteries without significant stenoses in the anterior circulation. 6.  Fetal type posterior cerebral arteries bilaterally. 7. Moderate to severe stenoses within the proximal hypoplastic basilar artery.   Original Report Authenticated By: Marin Roberts, M.D.   Mr Brain Wo Contrast  08/25/2012   *RADIOLOGY REPORT*  Clinical Data: Vertigo of 2 weeks dizziness  MRI HEAD WITHOUT CONTRAST  Technique:  Multiplanar, multiecho pulse sequences of the brain and surrounding structures were obtained according to standard protocol without intravenous contrast.  Comparison: CT 07/22/2007  Findings: Small area of restricted diffusion left inferior  cerebellum compatible with acute infarct measuring 5 x 7 mm. Additional sub centimeter area of acute infarct in the right superior cerebellum.  Ventricle size is normal.  Small areas of chronic infarction in the cerebellum bilaterally.  Negative for hemorrhage or mass lesion.  No shift to the midline structures.  Paranasal sinuses are clear.  IMPRESSION: Small areas of acute and chronic infarct in the cerebellum bilaterally.   Original Report Authenticated By: Janeece Riggers, M.D.    Scheduled Meds: . amitriptyline  10 mg Oral QHS  . atorvastatin  40 mg Oral Daily  . clopidogrel  75 mg Oral Q breakfast  . enoxaparin (LOVENOX) injection  30 mg Subcutaneous QHS  . lisinopril  20 mg Oral Daily  . loratadine  10 mg Oral Daily  . LORazepam  1 mg Intravenous Once  . meclizine  25 mg Oral BID  . olopatadine  1 drop Both Eyes BID  . ondansetron  4 mg Oral Once  . sodium chloride  3 mL Intravenous Q12H   Continuous Infusions:   Principal Problem:   CVA (cerebral infarction) Active Problems:   Essential hypertension, benign   Dyslipidemia   Benign paroxysmal positional vertigo    Time spent: > 35 mins    [facility]  [facility] Pager [phone]. If 7PM-7AM, please contact night-coverage at [email or web address], password [facility] 08/27/2012, 6:13 PM  LOS: 2 days

## 2012-08-27 NOTE — Progress Notes (Signed)
Stroke Team Progress Note  HISTORY Donna Gilmore is an 72 y.o. female with know HTN and hyperlipidemia. Patient is on ASA 81 mg daily and states she has not missed a dose. 2 weeks ago she noted she was "dizzy, weak and felt light headed. This continued for 2 weeks". On Monday (08/24/2012) her dizziness became suddenly worse, prompting her to go to the ED. Due to the long wait she left. She returned to the hospital yesterday due to ongoing dizziness. She denies vertiginous sensation or room spinning. She admits to feeling off balance but does not recall listing to one particular side. On admission her BP was 147/69 and most recent was 151/67. Patient was not a TPA candidate secondary to delay in arrival. She was admitted for further evaluation and treatment.  SUBJECTIVE Her daughter, son and friend are at the bedside.  The patient is in the vascular lab. Dr. Pearlean Brownie met with the family, then saw the patient just as she came out of MRI scan. Currently the patient is tearful after having a MRI - she does not like the machine. The tech said she did great and tolerated the test without difficulty.   OBJECTIVE Most recent Vital Signs: Filed Vitals:   08/26/12 1755 08/26/12 2127 08/27/12 0226 08/27/12 0602  BP: 155/81 149/61 139/64 119/58  Pulse: 72 55 57 54  Temp: 98.4 F (36.9 C) 97.2 F (36.2 C) 98.1 F (36.7 C) 97.4 F (36.3 C)  TempSrc: Oral Oral Oral Oral  Resp: 22 20 20 20   Height:      Weight:      SpO2: 100% 100% 99% 99%   CBG (last 3)   Recent Labs  08/26/12 1641 08/26/12 2325 08/27/12 0640  GLUCAP 99 106* 99    IV Fluid Intake:     MEDICATIONS  . amitriptyline  10 mg Oral QHS  . atorvastatin  40 mg Oral Daily  . clopidogrel  75 mg Oral Q breakfast  . enoxaparin (LOVENOX) injection  30 mg Subcutaneous QHS  . lisinopril  20 mg Oral Daily  . loratadine  10 mg Oral Daily  . LORazepam  1 mg Intravenous Once  . meclizine  25 mg Oral BID  . olopatadine  1 drop Both Eyes BID   . ondansetron  4 mg Oral Once  . sodium chloride  3 mL Intravenous Q12H   PRN:  sodium chloride, acetaminophen, senna-docusate, sodium chloride  Diet:  Cardiac thin liquids Activity:  Bedrest, as tolerated DVT Prophylaxis:  Lovenox 30 mg sq daily   CLINICALLY SIGNIFICANT STUDIES Basic Metabolic Panel:  Recent Labs Lab 08/26/12 1120 08/27/12 0538  NA 137 138  K 4.3 3.7  CL 104 105  CO2 23 25  GLUCOSE 93 97  BUN 11 9  CREATININE 0.93 0.92  CALCIUM 9.5 9.2   Liver Function Tests:  Recent Labs Lab 08/26/12 1120  AST 20  ALT 12  ALKPHOS 104  BILITOT 0.3  PROT 6.7  ALBUMIN 3.3*   CBC:  Recent Labs Lab 08/25/12 1502 08/25/12 2310 08/26/12 1120  WBC 6.5 7.3 5.9  NEUTROABS 2.7  --  2.6  HGB 13.3 11.8* 12.8  HCT 39.9 35.4* 37.1  MCV 87.1 86.1 86.3  PLT 228 218 210   Coagulation:  Recent Labs Lab 08/26/12 1120  LABPROT 12.9  INR 0.99   Cardiac Enzymes:  Recent Labs Lab 08/26/12 1120 08/26/12 1642 08/26/12 2317  TROPONINI <0.30 <0.30 <0.30   Urinalysis:  Recent Labs Lab 08/25/12 1607  COLORURINE YELLOW  LABSPEC 1.004*  PHURINE 6.5  GLUCOSEU NEGATIVE  HGBUR SMALL*  BILIRUBINUR NEGATIVE  KETONESUR NEGATIVE  PROTEINUR NEGATIVE  UROBILINOGEN 0.2  NITRITE NEGATIVE  LEUKOCYTESUR NEGATIVE   Lipid Panel    Component Value Date/Time   CHOL 271* 08/26/2012 0538   TRIG 301* 08/26/2012 0538   HDL 31* 08/26/2012 0538   CHOLHDL 8.7 08/26/2012 0538   VLDL 60* 08/26/2012 0538   LDLCALC 180* 08/26/2012 0538   HgbA1C  Lab Results  Component Value Date   HGBA1C 6.0* 08/26/2012    Urine Drug Screen:     Component Value Date/Time   LABOPIA NONE DETECTED 08/25/2012 1607   COCAINSCRNUR NONE DETECTED 08/25/2012 1607   LABBENZ NONE DETECTED 08/25/2012 1607   AMPHETMU NONE DETECTED 08/25/2012 1607   THCU NONE DETECTED 08/25/2012 1607   LABBARB NONE DETECTED 08/25/2012 1607    Alcohol Level: No results found for this basename: ETH,  in the last 168 hours  CT of  the brain    MRI of the brain  08/25/2012   Small areas of acute and chronic infarct in the cerebellum bilaterally.  MRA of the brain    MRA of the neck    2D Echocardiogram    Carotid Doppler    CXR  08/26/2012    No acute findings   EKG  nonspecific ST and T waves changes, sinus bradycardia.   Therapy Recommendations   Physical Exam  Exam limited due to uncooperative state following MRA that she did not like. She is awake, follows commands,  Moves all extremities. Answers to her name and follows commands well. No focal weakness. Sensation and coordination not tested in detail  ASSESSMENT [name] is a 72 y.o. female presenting with ongoing dizziness x 2 weeks. CT imaging confirms a bilateral cerebellar infarcts. 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 Now on clopidogrel 75 mg orally every day for secondary stroke prevention. Patient with resultant dizziness and ataxia. Work up underway.  Hypertension Hyperlipidemia, LDL 180, on zocor 20 PTA, now on atorvostatin 40, goal LDL < 100 (< 70 for diabetics)  Hospital day # 2  TREATMENT/PLAN  Continue clopidogrel 75 mg orally every day for secondary stroke prevention. Maintain good hydration  Follow up 2D and carotid doppler - to look for potential additional risk factors  Ongoing risk factor control  Discussed with daughter and son and answered questions  Annie Main, MSN, RN, ANVP-BC, ANP-BC, GNP-BC [facility] Pager: [phone] 08/27/2012 9:36 AM  I have personally obtained a history, examined the patient, evaluated imaging results, and formulated the assessment and plan of care. I agree with the above. Pramod Sethi,MD

## 2012-08-28 LAB — BASIC METABOLIC PANEL
CO2: 25 mEq/L (ref 19–32)
Calcium: 9.8 mg/dL (ref 8.4–10.5)
Creatinine, Ser: 0.93 mg/dL (ref 0.50–1.10)
GFR calc non Af Amer: 60 mL/min — ABNORMAL LOW (ref >90)
Glucose, Bld: 87 mg/dL (ref 70–99)
Sodium: 143 mEq/L (ref 135–145)

## 2012-08-28 MED ORDER — LISINOPRIL 40 MG PO TABS
40.0000 mg | ORAL_TABLET | Freq: Every day | ORAL | Status: DC
  Administered 2012-08-28: 40 mg via ORAL
  Filled 2012-08-28: qty 1

## 2012-08-28 MED ORDER — LISINOPRIL 40 MG PO TABS: 40.0000 mg | ORAL_TABLET | Freq: Every day | ORAL | Status: DC

## 2012-08-28 MED ORDER — CLOPIDOGREL BISULFATE 75 MG PO TABS: 75.0000 mg | ORAL_TABLET | Freq: Every day | ORAL | Status: DC

## 2012-08-28 MED ORDER — ONDANSETRON HCL 4 MG/2ML IJ SOLN
4.0000 mg | Freq: Four times a day (QID) | INTRAMUSCULAR | Status: DC | PRN
  Administered 2012-08-28: 4 mg via INTRAVENOUS
  Filled 2012-08-28: qty 2

## 2012-08-28 MED ORDER — ATORVASTATIN CALCIUM 40 MG PO TABS: 40.0000 mg | ORAL_TABLET | Freq: Every day | ORAL | Status: DC

## 2012-08-28 NOTE — Progress Notes (Signed)
Physical Therapy Treatment Patient Details Name: Donna Gilmore MRN: [id number] DOB: August 24, 1940 Today's Date: 08/28/2012 Time: 4098-1191 PT Time Calculation (min): 36 min  PT Assessment / Plan / Recommendation  History of Present Illness   72 y.o. female with hx of HTN who presents today with complain of dizziness that has been progressing since last 3 days. She says she was at her baseline on Sunday morning and around 6pm Sunday evening she started feeling dizzy and lightheadedness. MRI + BIL cerebellum infarcts    PT Comments   Pt making steady progress with mobility.  C/o dizziness throughout session.  BP 141/75.  Cont to recommend OPPT for higher level balance activities.     Follow Up Recommendations  Outpatient PT;Other (comment)     Does the patient have the potential to tolerate intense rehabilitation     Barriers to Discharge        Equipment Recommendations  None recommended by PT    Recommendations for Other Services    Frequency Min 3X/week   Progress towards PT Goals Progress towards PT goals: Progressing toward goals  Plan Current plan remains appropriate    Precautions / Restrictions Precautions Precautions: Fall   Pertinent Vitals/Pain No pain reported.  C/o dizziness throughout session but reports dizziness resolved at end of session.  BP 141/75 when Pt c/o dizziness.      Mobility  Bed Mobility Bed Mobility: Supine to Sit;Sitting - Scoot to Edge of Bed Supine to Sit: 6: Modified independent (Device/Increase time);HOB flat;With rails Sitting - Scoot to Edge of Bed: 6: Modified independent (Device/Increase time) Details for Bed Mobility Assistance: incr time but able to complete without (A) Transfers Transfers: Sit to Stand;Stand to Sit Sit to Stand: 5: Supervision;With upper extremity assist;From bed;From toilet Stand to Sit: 5: Supervision;With upper extremity assist;With armrests;To toilet;To chair/3-in-1 Details for Transfer Assistance: Supervision for  safety due to pt c/o dizziness.   Ambulation/Gait Ambulation/Gait Assistance: 4: Min guard Ambulation Distance (Feet): 300 Feet Assistive device: None Ambulation/Gait Assistance Details: No overt LOB.  Performed various gait challenges such as scanning environment, gait speed changes, sudden stop/go with mild ant-post sway.  Pt c/o dizziness with ambulation.  BP 141/75.   Gait Pattern: Step-through pattern (decreased step height) Gait velocity: generally slower, but more steady when cued to speed up slightly Stairs: No Wheelchair Mobility Wheelchair Mobility: No Modified Rankin (Stroke Patients Only) Pre-Morbid Rankin Score: No symptoms Modified Rankin: Slight disability      PT Goals (current goals can now be found in the care plan section) Acute Rehab PT Goals Patient Stated Goal: to return home and (A) with elderly mothers care PT Goal Formulation: With patient Time For Goal Achievement: 08/26/12 Potential to Achieve Goals: Good  Visit Information  Last PT Received On: 08/28/12 Assistance Needed: +1 History of Present Illness:   72 y.o. female with hx of HTN who presents today with complain of dizziness that has been progressing since last 3 days. She says she was at her baseline on Sunday morning and around 6pm Sunday evening she started feeling dizzy and lightheadedness. MRI + BIL cerebellum infarcts     Subjective Data  Patient Stated Goal: to return home and (A) with elderly mothers care   Cognition  Cognition Arousal/Alertness: Awake/alert Behavior During Therapy: WFL for tasks assessed/performed Overall Cognitive Status: Within Functional Limits for tasks assessed    Balance  Balance Balance Assessed: Yes Static Standing Balance Static Standing - Balance Support: No upper extremity supported Static Standing -  Level of Assistance: 5: Stand by assistance Static Standing - Comment/# of Minutes: mild ant-post sway with standing with eyes closed.  Min assist +  unilateral UE support to maintain balance with SLS x 15 secs each LE.  Min assist without UE support during tandem stance.   Dynamic Standing Balance Dynamic Standing - Balance Support: No upper extremity supported Dynamic Standing - Level of Assistance: 5: Stand by assistance Dynamic Standing - Balance Activities: Other (comment);Reaching across midline;Forward lean/weight shifting;Lateral lean/weight shifting (360 degree turns, picking object up off floor.  )  End of Session PT - End of Session Activity Tolerance: Patient tolerated treatment well Patient left: in chair;with call bell/phone within reach Nurse Communication: Mobility status   GP     Lara Mulch 08/28/2012, 8:34 AM   Verdell Face, PTA [phone] 08/28/2012

## 2012-08-28 NOTE — Discharge Summary (Signed)
Physician Discharge Summary  Donna Gilmore UEA:[id number] DOB: [birth year] DOA: 08/25/2012  PCP: Dorrene German, MD  Admit date: 08/25/2012 Discharge date: 08/28/2012  Time spent: 65 minutes  Recommendations for Outpatient Follow-up:  1. Patient is to followup with PCP one week post discharge. On followup patient's blood pressure will need to be assessed. Patient will need a basic metabolic profile done to followup on electrolytes and renal function. Patient will need further risk factor modification. 2. Patient is to followup with Dr. Pearlean Brownie of neurology 2 months post discharge.  Discharge Diagnoses:  Principal Problem:   CVA (cerebral infarction) Active Problems:   Essential hypertension, benign   Dyslipidemia   Benign paroxysmal positional vertigo   Discharge Condition: Stable  Diet recommendation: Heart healthy  Filed Weights   08/25/12 2200  Weight: 73.6 kg (162 lb 4.1 oz)    History of present illness:  Donna Gilmore is a 72 y.o. female with Past medical history of hypertension who presents today with complain of dizziness that has been progressing since last 3 days. She says she was at her baseline onnsunday morning and around 6pm Sunday evening she started feeling dizzy and lightheadedness. Along with that she had a sense of feeling tired and since her tiredness was getting worse she decided to come to hospital.  She says that her dizziness is not vertigo, it feels in her head. She does not have any headache, or complain of focal neurological deficit. She denies any trauma or fall.  Pt denies any fever, chills, headache, nausea, vomiting, abdominal pain, diarrhea, constipation, active bleeding, chest pain, palpitation, dizziness, pedal edema, orthopnea, PND, burning urination, cough, shortness of breath.   Hospital Course:  #1 acute CVA  Patient was admitted with complaints of dizziness that been progressive over 3 days prior to admission. Patient had a MRI done which  was consistent with a cerebellar stroke. Patient was admitted to telemetry neurology was consulted. LDL done was 180. Patient's Lipitor was increased to 40 mg daily. Patient had been on aspirin prior to admission and subsequently changed to Plavix. 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 Patient was also seen by PT/ OT. 2-D echo with EF of 60-65%. With no source of emboli. Patient was maintained on Plavix. Patient will be discharged home with home health PT OT and will followup with neurology as outpatient. #2 hypertension  Patient was was maintained on lisinopril and dose increased to 40 mg daily for better blood pressure control. Patient will followup with PCP as outpatient.   #3 hyperlipidemia  LDL obtained during this hospitalization was 180. Patient's Lipitor was increased to 40 mg daily with goal LDL of less than 100. Patient will followup with PCP as outpatient. #4 history of vertigo  Continued on home dose meclizine.      Procedures: MRI head 08/25/2012  Chest x-ray 08/26/2012  2 D echo 08/27/12 MRA of Head 08/27/2012  Consultations: Neurology: Dr Thad Ranger 08/26/12   Discharge Exam: Filed Vitals:   08/27/12 2132 08/28/12 0250 08/28/12 0600 08/28/12 1120  BP: 154/75 116/51 152/76 128/63  Pulse: 55 60 54 61  Temp: 97.8 F (36.6 C) 97.6 F (36.4 C) 98.2 F (36.8 C) 98.3 F (36.8 C)  TempSrc: Oral Oral Oral Oral  Resp: 18 18 18 18   Height:      Weight:      SpO2:  98% 100% 100% 100%    General: NAD Cardiovascular: RRR Respiratory: CTAB  Discharge Instructions  Discharge Orders   Future Appointments Provider Department Dept Phone   09/02/2012 11:50 AM [facility] [phone]   Patient should wear two piece clothing and wear no powder or deodorant. Patient  should arrive 15 minutes early.   Future Orders Complete By Expires     Diet - low sodium heart healthy  As directed     Discharge instructions  As directed     Comments:      Follow up with AVBUERE,EDWIN A, MD in 1 week. Follow up Dr Pearlean Brownie in 2 months.    Increase activity slowly  As directed         Medication List    STOP taking these medications       aspirin EC 81 MG tablet      TAKE these medications       amitriptyline 10 MG tablet  Commonly known as:  ELAVIL  Take 10 mg by mouth at bedtime.     atorvastatin 40 MG tablet  Commonly known as:  LIPITOR  Take 1 tablet (40 mg total) by mouth daily.     cetirizine 10 MG tablet  Commonly known as:  ZYRTEC  Take 10 mg by mouth daily.     clopidogrel 75 MG tablet  Commonly known as:  PLAVIX  Take 1 tablet (75 mg total) by mouth daily with breakfast.     LASTACAFT 0.25 % Soln  Generic drug:  Alcaftadine  Apply 1 drop to eye every evening. One drop in each eye     lisinopril 40 MG tablet  Commonly known as:  PRINIVIL,ZESTRIL  Take 1 tablet (40 mg total) by mouth daily.     meclizine 25 MG tablet  Commonly known as:  ANTIVERT  Take 25 mg by mouth 2 (two) times daily.     vitamin C 500 MG tablet  Commonly known as:  ASCORBIC ACID  Take 500 mg by mouth daily.       No Known Allergies     Follow-up Information   Follow up with Gates Rigg, MD. Schedule an appointment as soon as possible for a visit in 2 months. (stroke clinic)    Contact information:   [address] [place] [address] [phone]       Follow up with Fleet Contras A, MD. Schedule an appointment as soon as possible for a visit in 1 week.   Contact information:   [address] [place] [address] [phone]        The results of significant diagnostics from this hospitalization (including imaging, microbiology, ancillary and laboratory) are listed below for reference.    Significant Diagnostic  Studies: Dg Chest 2 View  08/26/2012   *RADIOLOGY REPORT*  Clinical Data: Stroke, weakness  CHEST - 2 VIEW  Comparison: 12/04/2004 CT scan and 12/04/2004 chest radiograph  Findings: Heart size and vascular pattern are normal.  Lungs are clear.  No change from prior studies.  IMPRESSION: No acute findings   Original Report Authenticated By: Esperanza Heir, M.D.   Mr [facility] Wo Contrast  08/27/2012   *RADIOLOGY REPORT*  Clinical Data:  Punctate cerebellar infarcts.  MRA HEAD WITHOUT CONTRAST  Technique:  Angiographic images of the Circle of Willis were obtained using MRA technique without intravenous contrast.  Comparison:  MRI brain 08/25/2012.  Findings:  Mild atherosclerotic irregularities present in the cavernous  carotid arteries bilaterally without to a significant stenosis.  Prominent posterior communicating arteries are present bilaterally.  The there is a moderate stenosis in the proximal right A1 segment.  The left A1 segment is patent.  The M1 segments are normal bilaterally.  The anterior communicating artery is patent.  The MCA bifurcations are intact but there is significant branch vessel attenuation bilaterally, right greater than left.  There is a severe stenosis of the distal right vertebral artery proximal to the PICA.  To the left vertebral artery is hypoplastic with segmental stenoses and no significant flow beyond the skull base.  The proximal basilar artery demonstrates no flow.  There is minimal flow and a small distal basilar artery.  Both posterior cerebral arteries are of fetal type with some attenuation of distal branch vessels.  IMPRESSION:  1.  Severe vertebral basilar disease without significant flow signal identified in the proximal basilar artery. 2.  High-grade distal right vertebral artery stenosis. 3.  Occlusion of the nondominant left vertebral artery. 4.  Moderate small vessel disease within the anterior circulation.  *RADIOLOGY REPORT*  MRI NECK WITHOUT AND WITH CONTRAST   Technique:  Multiplanar, multiecho pulse sequences of the neck and surrounding structures were obtained without and with intravenous contrast.  Contrast: 15mL MULTIHANCE GADOBENATE DIMEGLUMINE 529 MG/ML IV SOLN  Findings:  The time-of-flight images demonstrates moderate tortuosity of the proximal internal carotid arteries bilaterally without significant flow disturbance.  Antegrade flow is present in the right vertebral artery.  There is a common origin of the left common carotid artery and the innominate artery, a normal variant.  Focal signal loss at the origin of the right vertebral artery suggests a high-grade stenosis.  The there is segmental opacification of the left vertebral artery without significant signal proximally.  This suggests proximal occlusion with reconstitution via muscular branches.  The vessel terminates at the left PICA.  A high-grade stenosis of the distal right vertebral artery, proximal to the PICA is confirmed.  Moderate to severe stenoses are present proximally within a hypoplastic basilar artery.  The right common carotid artery is within normal limits.  The bifurcation demonstrates minimal atherosclerotic change without significant stenosis.  There is tortuosity of the proximal right internal carotid artery without a significant stenosis.  A fetal type right posterior cerebral artery is present.  The left common carotid artery is tortuous proximally.  Mild atherosclerotic irregularity is present at the carotid bifurcation without a significant stenosis.  There is tortuosity within the proximal left internal carotid artery as well.  No significant ICA stenosis is present.  A fetal type left posterior cerebral artery is present.  IMPRESSION:  1.  High-grade stenosis of the right vertebral artery origin, the dominant vessel. 2.  High-grade stenosis of the distal right vertebral artery, just proximal to the PICA. 3.  Occlusion of the left vertebral artery with faint reconstitution likely by  musculoskeletal branches. 4.  Segmental stenoses of the residual left vertebral artery without to signal beyond the PICA.  This may represent a congenitally small vessel which terminates at the PICA versus a distal occlusion. 5.  Tortuous common carotid arteries and internal carotid arteries without significant stenoses in the anterior circulation. 6.  Fetal type posterior cerebral arteries bilaterally. 7. Moderate to severe stenoses within the proximal hypoplastic basilar artery.   Original Report Authenticated By: Marin Roberts, M.D.   Mr Angiogram Neck W Wo Contrast  08/27/2012   *RADIOLOGY REPORT*  Clinical Data:  Punctate cerebellar infarcts.  MRA HEAD WITHOUT  CONTRAST  Technique:  Angiographic images of the Circle of Willis were obtained using MRA technique without intravenous contrast.  Comparison:  MRI brain 08/25/2012.  Findings:  Mild atherosclerotic irregularities present in the cavernous carotid arteries bilaterally without to a significant stenosis.  Prominent posterior communicating arteries are present bilaterally.  The there is a moderate stenosis in the proximal right A1 segment.  The left A1 segment is patent.  The M1 segments are normal bilaterally.  The anterior communicating artery is patent.  The MCA bifurcations are intact but there is significant branch vessel attenuation bilaterally, right greater than left.  There is a severe stenosis of the distal right vertebral artery proximal to the PICA.  To the left vertebral artery is hypoplastic with segmental stenoses and no significant flow beyond the skull base.  The proximal basilar artery demonstrates no flow.  There is minimal flow and a small distal basilar artery.  Both posterior cerebral arteries are of fetal type with some attenuation of distal branch vessels.  IMPRESSION:  1.  Severe vertebral basilar disease without significant flow signal identified in the proximal basilar artery. 2.  High-grade distal right vertebral artery  stenosis. 3.  Occlusion of the nondominant left vertebral artery. 4.  Moderate small vessel disease within the anterior circulation.  *RADIOLOGY REPORT*  MRI NECK WITHOUT AND WITH CONTRAST  Technique:  Multiplanar, multiecho pulse sequences of the neck and surrounding structures were obtained without and with intravenous contrast.  Contrast: 15mL MULTIHANCE GADOBENATE DIMEGLUMINE 529 MG/ML IV SOLN  Findings:  The time-of-flight images demonstrates moderate tortuosity of the proximal internal carotid arteries bilaterally without significant flow disturbance.  Antegrade flow is present in the right vertebral artery.  There is a common origin of the left common carotid artery and the innominate artery, a normal variant.  Focal signal loss at the origin of the right vertebral artery suggests a high-grade stenosis.  The there is segmental opacification of the left vertebral artery without significant signal proximally.  This suggests proximal occlusion with reconstitution via muscular branches.  The vessel terminates at the left PICA.  A high-grade stenosis of the distal right vertebral artery, proximal to the PICA is confirmed.  Moderate to severe stenoses are present proximally within a hypoplastic basilar artery.  The right common carotid artery is within normal limits.  The bifurcation demonstrates minimal atherosclerotic change without significant stenosis.  There is tortuosity of the proximal right internal carotid artery without a significant stenosis.  A fetal type right posterior cerebral artery is present.  The left common carotid artery is tortuous proximally.  Mild atherosclerotic irregularity is present at the carotid bifurcation without a significant stenosis.  There is tortuosity within the proximal left internal carotid artery as well.  No significant ICA stenosis is present.  A fetal type left posterior cerebral artery is present.  IMPRESSION:  1.  High-grade stenosis of the right vertebral artery  origin, the dominant vessel. 2.  High-grade stenosis of the distal right vertebral artery, just proximal to the PICA. 3.  Occlusion of the left vertebral artery with faint reconstitution likely by musculoskeletal branches. 4.  Segmental stenoses of the residual left vertebral artery without to signal beyond the PICA.  This may represent a congenitally small vessel which terminates at the PICA versus a distal occlusion. 5.  Tortuous common carotid arteries and internal carotid arteries without significant stenoses in the anterior circulation. 6.  Fetal type posterior cerebral arteries bilaterally. 7. Moderate to severe stenoses within the proximal hypoplastic basilar  artery.   Original Report Authenticated By: Marin Roberts, M.D.   Mr Brain Wo Contrast  08/25/2012   *RADIOLOGY REPORT*  Clinical Data: Vertigo of 2 weeks dizziness  MRI HEAD WITHOUT CONTRAST  Technique:  Multiplanar, multiecho pulse sequences of the brain and surrounding structures were obtained according to standard protocol without intravenous contrast.  Comparison: CT 07/22/2007  Findings: Small area of restricted diffusion left inferior cerebellum compatible with acute infarct measuring 5 x 7 mm. Additional sub centimeter area of acute infarct in the right superior cerebellum.  Ventricle size is normal.  Small areas of chronic infarction in the cerebellum bilaterally.  Negative for hemorrhage or mass lesion.  No shift to the midline structures.  Paranasal sinuses are clear.  IMPRESSION: Small areas of acute and chronic infarct in the cerebellum bilaterally.   Original Report Authenticated By: Janeece Riggers, M.D.    Microbiology: No results found for this or any previous visit (from the past 240 hour(s)).   Labs: Basic Metabolic Panel:  Recent Labs Lab 08/25/12 1502 08/25/12 2310 08/26/12 1120 08/27/12 0538 08/28/12 0551  NA 137  --  137 138 143  K 3.8  --  4.3 3.7 4.1  CL 103  --  104 105 106  CO2 22  --  23 25 25   GLUCOSE  133*  --  93 97 87  BUN 8  --  11 9 8   CREATININE 0.86 0.91 0.93 0.92 0.93  CALCIUM 9.7  --  9.5 9.2 9.8   Liver Function Tests:  Recent Labs Lab 08/26/12 1120  AST 20  ALT 12  ALKPHOS 104  BILITOT 0.3  PROT 6.7  ALBUMIN 3.3*   No results found for this basename: LIPASE, AMYLASE,  in the last 168 hours No results found for this basename: AMMONIA,  in the last 168 hours CBC:  Recent Labs Lab 08/25/12 1502 08/25/12 2310 08/26/12 1120  WBC 6.5 7.3 5.9  NEUTROABS 2.7  --  2.6  HGB 13.3 11.8* 12.8  HCT 39.9 35.4* 37.1  MCV 87.1 86.1 86.3  PLT 228 218 210   Cardiac Enzymes:  Recent Labs Lab 08/25/12 2310 08/26/12 0538 08/26/12 1120 08/26/12 1642 08/26/12 2317  TROPONINI <0.30 <0.30 <0.30 <0.30 <0.30   BNP: BNP (last 3 results) No results found for this basename: PROBNP,  in the last 8760 hours CBG:  Recent Labs Lab 08/27/12 0640 08/27/12 1149 08/27/12 1703 08/27/12 2156 08/28/12 1128  GLUCAP 99 85 76 90 89       Signed:  Tytan Sandate  [facility] 08/28/2012, 2:08 PM

## 2012-08-28 NOTE — Progress Notes (Signed)
Stroke Team Progress Note  HISTORY Donna Gilmore is an 72 y.o. female with know HTN and hyperlipidemia. Patient is on ASA 81 mg daily and states she has not missed a dose. 2 weeks ago she noted she was "dizzy, weak and felt light headed. This continued for 2 weeks". On Monday (08/24/2012) her dizziness became suddenly worse, prompting her to go to the ED. Due to the long wait she left. She returned to the hospital yesterday due to ongoing dizziness. She denies vertiginous sensation or room spinning. She admits to feeling off balance but does not recall listing to one particular side. On admission her BP was 147/69 and most recent was 151/67. Patient was not a TPA candidate secondary to delay in arrival. She was admitted for further evaluation and treatment.  SUBJECTIVE Patient up in chair at bedside. Son also in room. Patient lives with her mother who is 7, they have a caregiver who watches both of them.  OBJECTIVE Most recent Vital Signs: Filed Vitals:   08/27/12 1819 08/27/12 2132 08/28/12 0250 08/28/12 0600  BP: 160/82 154/75 116/51 152/76  Pulse: 72 55 60 54  Temp: 98.6 F (37 C) 97.8 F (36.6 C) 97.6 F (36.4 C) 98.2 F (36.8 C)  TempSrc: Oral Oral Oral Oral  Resp: 20 18 18 18   Height:      Weight:      SpO2: 100% 98% 100% 100%   CBG (last 3)   Recent Labs  08/27/12 1149 08/27/12 1703 08/27/12 2156  GLUCAP 85 76 90    IV Fluid Intake:     MEDICATIONS  . amitriptyline  10 mg Oral QHS  . atorvastatin  40 mg Oral Daily  . clopidogrel  75 mg Oral Q breakfast  . enoxaparin (LOVENOX) injection  30 mg Subcutaneous QHS  . lisinopril  40 mg Oral Daily  . loratadine  10 mg Oral Daily  . LORazepam  1 mg Intravenous Once  . meclizine  25 mg Oral BID  . olopatadine  1 drop Both Eyes BID  . ondansetron  4 mg Oral Once  . sodium chloride  3 mL Intravenous Q12H   PRN:  sodium chloride, acetaminophen, ondansetron, senna-docusate, sodium chloride  Diet:  Cardiac thin  liquids Activity:  Bedrest, as tolerated DVT Prophylaxis:  Lovenox 30 mg sq daily   CLINICALLY SIGNIFICANT STUDIES Basic Metabolic Panel:   Recent Labs Lab 08/27/12 0538 08/28/12 0551  NA 138 143  K 3.7 4.1  CL 105 106  CO2 25 25  GLUCOSE 97 87  BUN 9 8  CREATININE 0.92 0.93  CALCIUM 9.2 9.8   Liver Function Tests:   Recent Labs Lab 08/26/12 1120  AST 20  ALT 12  ALKPHOS 104  BILITOT 0.3  PROT 6.7  ALBUMIN 3.3*   CBC:   Recent Labs Lab 08/25/12 1502 08/25/12 2310 08/26/12 1120  WBC 6.5 7.3 5.9  NEUTROABS 2.7  --  2.6  HGB 13.3 11.8* 12.8  HCT 39.9 35.4* 37.1  MCV 87.1 86.1 86.3  PLT 228 218 210   Coagulation:   Recent Labs Lab 08/26/12 1120  LABPROT 12.9  INR 0.99   Cardiac Enzymes:   Recent Labs Lab 08/26/12 1120 08/26/12 1642 08/26/12 2317  TROPONINI <0.30 <0.30 <0.30   Urinalysis:   Recent Labs Lab 08/25/12 1607  COLORURINE YELLOW  LABSPEC 1.004*  PHURINE 6.5  GLUCOSEU NEGATIVE  HGBUR SMALL*  BILIRUBINUR NEGATIVE  KETONESUR NEGATIVE  PROTEINUR NEGATIVE  UROBILINOGEN 0.2  NITRITE NEGATIVE  LEUKOCYTESUR NEGATIVE   Lipid Panel    Component Value Date/Time   CHOL 271* 08/26/2012 0538   TRIG 301* 08/26/2012 0538   HDL 31* 08/26/2012 0538   CHOLHDL 8.7 08/26/2012 0538   VLDL 60* 08/26/2012 0538   LDLCALC 180* 08/26/2012 0538   HgbA1C  Lab Results  Component Value Date   HGBA1C 6.0* 08/26/2012    Urine Drug Screen:     Component Value Date/Time   LABOPIA NONE DETECTED 08/25/2012 1607   COCAINSCRNUR NONE DETECTED 08/25/2012 1607   LABBENZ NONE DETECTED 08/25/2012 1607   AMPHETMU NONE DETECTED 08/25/2012 1607   THCU NONE DETECTED 08/25/2012 1607   LABBARB NONE DETECTED 08/25/2012 1607    Alcohol Level: No results found for this basename: ETH,  in the last 168 hours  CT of the brain    MRI of the brain  08/25/2012   Small areas of acute and chronic infarct in the cerebellum bilaterally.  MRA of the brain  08/27/2012 1. Severe  vertebral basilar disease without significant flow signal identified in the proximal basilar artery.  2. High-grade distal right vertebral artery stenosis. 3. Occlusion of the nondominant left vertebral artery. 4. Moderate small vessel disease within the anterior circulation.  MRA of the neck  08/27/2012 1. High-grade stenosis of the right vertebral artery origin, the dominant vessel.  2. High-grade stenosis of the distal right vertebral artery, just proximal to the PICA.  3. Occlusion of the left vertebral artery with faint  reconstitution likely by musculoskeletal branches. 4. Segmental stenoses of the residual left vertebral artery without to signal beyond the PICA. This may represent a congenitally small vessel which terminates at the PICA versus a distal occlusion. 5. Tortuous common carotid arteries and internal carotid arteries without significant stenoses in the anterior circulation. 6. Fetal type posterior cerebral arteries bilaterally. 7. Moderate to severe stenoses within the proximal hypoplastic basilar artery.  2D Echocardiogram  EF 60-65% with no source of embolus.   Carotid Doppler  See MRA neck  CXR  08/26/2012    No acute findings   EKG  nonspecific ST and T waves changes, sinus bradycardia.   Therapy Recommendations OP PT, HH OT  Physical Exam  Pleasant middle aged african american lady. She is awake, follows commands,  Moves all extremities. Answers to her name and follows commands well. No focal weakness. Sensation and coordination normal. Gait not tested.  ASSESSMENT [name] is a 72 y.o. female presenting with ongoing dizziness x 2 weeks. CT imaging confirms a bilateral cerebellar infarcts. 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  On aspirin 81 mg orally every day prior to admission. Now on clopidogrel 75 mg orally  every day for secondary stroke prevention. Patient with resultant dizziness and ataxia. Work up completed.  Hypertension Hyperlipidemia, LDL 180, on zocor 20 PTA, now on atorvostatin 40, goal LDL < 100 (< 70 for diabetics)  Hospital day # 3  TREATMENT/PLAN  Continue clopidogrel 75 mg orally every day for secondary stroke prevention.   Maintain good hydration  Cancel carotid doppler - MRA neck already done  [facility] PT and OT -  Discussed with daughter and son and answered questions  No further stroke workup indicated. Patient has a 10-15% risk of having another stroke over the next year, the highest risk is within 2 weeks of the most recent stroke/TIA (risk of having  a stroke following a stroke or TIA is the same). Ongoing risk factor control by Primary Care Physician Stroke Service will sign off. Please call should any needs arise. Follow up with Dr. Pearlean Brownie, Stroke Clinic, in 2 months.   Annie Main, MSN, RN, ANVP-BC, ANP-BC, [facility] Pager: [phone] 08/28/2012 10:21 AM  I have personally obtained a history, examined the patient, evaluated imaging results, and formulated the assessment and plan of care. I agree with the above.  Pramod Sethi,MD

## 2012-08-28 NOTE — Progress Notes (Signed)
Pt was sitting up in chair when I arrived today. She was laughing and talking. Son said they are waiting on drs to approve her discharge. Had prayer w/pt, son and daughter who were bedside. Pt thankful for visit and prayer. Marjory Lies Chaplain  08/28/12 1200  Clinical Encounter Type  Visited With Patient and family together

## 2012-09-02 ENCOUNTER — Ambulatory Visit: Payer: PRIVATE HEALTH INSURANCE

## 2012-09-21 ENCOUNTER — Encounter (HOSPITAL_COMMUNITY): Payer: Self-pay | Admitting: Emergency Medicine

## 2012-09-21 ENCOUNTER — Emergency Department (HOSPITAL_COMMUNITY): Payer: Medicare Other

## 2012-09-21 ENCOUNTER — Observation Stay (HOSPITAL_COMMUNITY)
Admission: EM | Admit: 2012-09-21 | Discharge: 2012-09-22 | Disposition: A | Discharge status: Home / Self Care | Payer: Medicare Other | Attending: Internal Medicine | Admitting: Internal Medicine

## 2012-09-21 DIAGNOSIS — H8110 Benign paroxysmal vertigo, unspecified ear: Secondary | ICD-10-CM | POA: Diagnosis present

## 2012-09-21 DIAGNOSIS — R079 Chest pain, unspecified: Secondary | ICD-10-CM

## 2012-09-21 DIAGNOSIS — Z720 Tobacco use: Secondary | ICD-10-CM

## 2012-09-21 DIAGNOSIS — I7090 Unspecified atherosclerosis: Secondary | ICD-10-CM | POA: Insufficient documentation

## 2012-09-21 DIAGNOSIS — I6350 Cerebral infarction due to unspecified occlusion or stenosis of unspecified cerebral artery: Secondary | ICD-10-CM

## 2012-09-21 DIAGNOSIS — I10 Essential (primary) hypertension: Secondary | ICD-10-CM | POA: Insufficient documentation

## 2012-09-21 DIAGNOSIS — N183 Chronic kidney disease, stage 3 unspecified: Secondary | ICD-10-CM | POA: Diagnosis present

## 2012-09-21 DIAGNOSIS — E785 Hyperlipidemia, unspecified: Secondary | ICD-10-CM | POA: Diagnosis present

## 2012-09-21 DIAGNOSIS — Z7902 Long term (current) use of antithrombotics/antiplatelets: Secondary | ICD-10-CM | POA: Insufficient documentation

## 2012-09-21 DIAGNOSIS — R0789 Other chest pain: Principal | ICD-10-CM | POA: Insufficient documentation

## 2012-09-21 DIAGNOSIS — Z79899 Other long term (current) drug therapy: Secondary | ICD-10-CM | POA: Insufficient documentation

## 2012-09-21 DIAGNOSIS — F17200 Nicotine dependence, unspecified, uncomplicated: Secondary | ICD-10-CM | POA: Insufficient documentation

## 2012-09-21 DIAGNOSIS — R7309 Other abnormal glucose: Secondary | ICD-10-CM | POA: Insufficient documentation

## 2012-09-21 DIAGNOSIS — I639 Cerebral infarction, unspecified: Secondary | ICD-10-CM | POA: Diagnosis present

## 2012-09-21 DIAGNOSIS — Z8673 Personal history of transient ischemic attack (TIA), and cerebral infarction without residual deficits: Secondary | ICD-10-CM | POA: Diagnosis present

## 2012-09-21 HISTORY — DX: Chest pain, unspecified: R07.9

## 2012-09-21 LAB — POCT I-STAT TROPONIN I: Troponin i, poc: 0 ng/mL (ref 0.00–0.08)

## 2012-09-21 LAB — CBC WITH DIFFERENTIAL/PLATELET
Eosinophils Absolute: 0.3 10*3/uL (ref 0.0–0.7)
Eosinophils Relative: 5 % (ref 0–5)
HCT: 36.5 % (ref 36.0–46.0)
Lymphocytes Relative: 29 % (ref 12–46)
Lymphs Abs: 1.9 10*3/uL (ref 0.7–4.0)
MCH: 30 pg (ref 26.0–34.0)
MCV: 86.9 fL (ref 78.0–100.0)
Monocytes Absolute: 0.4 10*3/uL (ref 0.1–1.0)
Monocytes Relative: 6 % (ref 3–12)
Platelets: 275 10*3/uL (ref 150–400)
RBC: 4.2 MIL/uL (ref 3.87–5.11)
WBC: 6.7 10*3/uL (ref 4.0–10.5)

## 2012-09-21 LAB — TROPONIN I: Troponin I: <0.3 ng/mL (ref <0.30)

## 2012-09-21 LAB — BASIC METABOLIC PANEL
BUN: 6 mg/dL (ref 6–23)
CO2: 24 mEq/L (ref 19–32)
Calcium: 10.2 mg/dL (ref 8.4–10.5)
Creatinine, Ser: 0.86 mg/dL (ref 0.50–1.10)
GFR calc non Af Amer: 66 mL/min — ABNORMAL LOW (ref >90)
Glucose, Bld: 101 mg/dL — ABNORMAL HIGH (ref 70–99)

## 2012-09-21 MED ORDER — AMITRIPTYLINE HCL 10 MG PO TABS
10.0000 mg | ORAL_TABLET | Freq: Every day | ORAL | Status: DC
  Administered 2012-09-21: 10 mg via ORAL
  Filled 2012-09-21 (×2): qty 1

## 2012-09-21 MED ORDER — ACETAMINOPHEN 325 MG PO TABS: 650.0000 mg | ORAL_TABLET | Freq: Four times a day (QID) | ORAL | Status: DC | PRN

## 2012-09-21 MED ORDER — ONDANSETRON HCL 4 MG PO TABS: 4.0000 mg | ORAL_TABLET | Freq: Four times a day (QID) | ORAL | Status: DC | PRN

## 2012-09-21 MED ORDER — INSULIN ASPART 100 UNIT/ML ~~LOC~~ SOLN: 0.0000 [IU] | Freq: Three times a day (TID) | SUBCUTANEOUS | Status: DC

## 2012-09-21 MED ORDER — VITAMIN C 500 MG PO TABS
500.0000 mg | ORAL_TABLET | Freq: Every day | ORAL | Status: DC
  Administered 2012-09-21 – 2012-09-22 (×2): 500 mg via ORAL
  Filled 2012-09-21 (×2): qty 1

## 2012-09-21 MED ORDER — ONDANSETRON HCL 4 MG/2ML IJ SOLN: 4.0000 mg | Freq: Four times a day (QID) | INTRAMUSCULAR | Status: DC | PRN

## 2012-09-21 MED ORDER — CLOPIDOGREL BISULFATE 75 MG PO TABS
75.0000 mg | ORAL_TABLET | Freq: Every day | ORAL | Status: DC
  Administered 2012-09-21 – 2012-09-22 (×2): 75 mg via ORAL
  Filled 2012-09-21 (×2): qty 1

## 2012-09-21 MED ORDER — ASPIRIN 81 MG PO CHEW
324.0000 mg | CHEWABLE_TABLET | Freq: Once | ORAL | Status: AC
  Administered 2012-09-21: 324 mg via ORAL
  Filled 2012-09-21: qty 4

## 2012-09-21 MED ORDER — ENOXAPARIN SODIUM 40 MG/0.4ML ~~LOC~~ SOLN
40.0000 mg | SUBCUTANEOUS | Status: DC
  Administered 2012-09-21 – 2012-09-22 (×2): 40 mg via SUBCUTANEOUS
  Filled 2012-09-21 (×2): qty 0.4

## 2012-09-21 MED ORDER — LORATADINE 10 MG PO TABS
10.0000 mg | ORAL_TABLET | Freq: Every day | ORAL | Status: DC
  Administered 2012-09-22: 10 mg via ORAL
  Filled 2012-09-21 (×2): qty 1

## 2012-09-21 MED ORDER — ATORVASTATIN CALCIUM 40 MG PO TABS
40.0000 mg | ORAL_TABLET | Freq: Every day | ORAL | Status: DC
  Administered 2012-09-21: 40 mg via ORAL
  Filled 2012-09-21 (×2): qty 1

## 2012-09-21 MED ORDER — PANTOPRAZOLE SODIUM 40 MG PO TBEC
40.0000 mg | DELAYED_RELEASE_TABLET | Freq: Every day | ORAL | Status: DC
  Administered 2012-09-21 – 2012-09-22 (×2): 40 mg via ORAL
  Filled 2012-09-21 (×2): qty 1

## 2012-09-21 MED ORDER — ACETAMINOPHEN 650 MG RE SUPP: 650.0000 mg | Freq: Four times a day (QID) | RECTAL | Status: DC | PRN

## 2012-09-21 MED ORDER — SODIUM CHLORIDE 0.9 % IJ SOLN
3.0000 mL | Freq: Two times a day (BID) | INTRAMUSCULAR | Status: DC
  Administered 2012-09-21 – 2012-09-22 (×3): 3 mL via INTRAVENOUS

## 2012-09-21 MED ORDER — LISINOPRIL 40 MG PO TABS
40.0000 mg | ORAL_TABLET | Freq: Every day | ORAL | Status: DC
  Administered 2012-09-21 – 2012-09-22 (×2): 40 mg via ORAL
  Filled 2012-09-21 (×2): qty 1

## 2012-09-21 MED ORDER — KETOTIFEN FUMARATE 0.025 % OP SOLN
1.0000 [drp] | Freq: Two times a day (BID) | OPHTHALMIC | Status: DC
  Administered 2012-09-21 – 2012-09-22 (×3): 1 [drp] via OPHTHALMIC
  Filled 2012-09-21: qty 5

## 2012-09-21 MED ORDER — MECLIZINE HCL 25 MG PO TABS
25.0000 mg | ORAL_TABLET | Freq: Three times a day (TID) | ORAL | Status: DC
  Administered 2012-09-21 – 2012-09-22 (×4): 25 mg via ORAL
  Filled 2012-09-21 (×5): qty 1

## 2012-09-21 NOTE — ED Notes (Signed)
Patient transported to X-ray 

## 2012-09-21 NOTE — ED Provider Notes (Signed)
CSN: [id number]     Arrival date & time 09/21/12  4098 History     First MD Initiated Contact with Patient 09/21/12 [phone]     Chief Complaint  Patient presents with  . Chest Pain   (Consider location/radiation/quality/duration/timing/severity/associated sxs/prior Treatment) Patient is a 72 y.o. female presenting with chest pain. The history is provided by the patient.  Chest Pain Pain location:  Substernal area Pain quality: crushing and pressure   Pain radiates to:  Does not radiate Pain radiates to the back: no   Pain severity:  Moderate Onset quality:  Gradual Duration:  4 hours Timing:  Rare Progression:  Resolved Chronicity:  New Relieved by:  Nothing Worsened by:  Nothing tried Ineffective treatments:  None tried Associated symptoms: shortness of breath   Associated symptoms: no abdominal pain, no back pain, no cough, no diaphoresis, no dizziness, no dysphagia, no fatigue, no fever, no headache, no nausea, no numbness, no palpitations and not vomiting     Past Medical History  Diagnosis Date  . Hypertension   . Dyslipidemia 08/25/2012  . Essential hypertension, benign 08/25/2012  . CVA (cerebral infarction) 08/25/2012  . Benign paroxysmal positional vertigo 08/26/2012   History reviewed. No pertinent past surgical history. History reviewed. No pertinent family history. History  Substance Use Topics  . Smoking status: Former Games developer  . Smokeless tobacco: Not on file  . Alcohol Use: No   OB History   Grav Para Term Preterm Abortions TAB SAB Ect Mult Living                 Review of Systems  Constitutional: Negative for fever, chills, diaphoresis and fatigue.  HENT: Negative for ear pain, congestion, sore throat, facial swelling, mouth sores, trouble swallowing, neck pain and neck stiffness.   Eyes: Negative.   Respiratory: Positive for shortness of breath. Negative for apnea, cough, chest tightness and wheezing.   Cardiovascular: Positive for chest pain. Negative  for palpitations and leg swelling.  Gastrointestinal: Negative for nausea, vomiting, abdominal pain, diarrhea and abdominal distention.  Genitourinary: Negative for hematuria, flank pain, vaginal discharge, difficulty urinating and menstrual problem.  Musculoskeletal: Negative for back pain and gait problem.  Skin: Negative for rash and wound.  Neurological: Negative for dizziness, tremors, seizures, syncope, facial asymmetry, numbness and headaches.  Psychiatric/Behavioral: Negative.   All other systems reviewed and are negative.    Allergies  Review of patient's allergies indicates no known allergies.  Home Medications   Current Outpatient Rx  Name  Route  Sig  Dispense  Refill  . Alcaftadine (LASTACAFT) 0.25 % SOLN   Ophthalmic   Apply 1 drop to eye every evening. One drop in each eye         . amitriptyline (ELAVIL) 10 MG tablet   Oral   Take 10 mg by mouth at bedtime.         Marland Kitchen atorvastatin (LIPITOR) 40 MG tablet   Oral   Take 1 tablet (40 mg total) by mouth daily.   30 tablet   0   . cetirizine (ZYRTEC) 10 MG tablet   Oral   Take 10 mg by mouth daily.         . clopidogrel (PLAVIX) 75 MG tablet   Oral   Take 1 tablet (75 mg total) by mouth daily with breakfast.   30 tablet   0   . lisinopril (PRINIVIL,ZESTRIL) 40 MG tablet   Oral   Take 1 tablet (40 mg total) by mouth daily.  30 tablet   0   . meclizine (ANTIVERT) 25 MG tablet   Oral   Take 25 mg by mouth 3 (three) times daily.          . vitamin C (ASCORBIC ACID) 500 MG tablet   Oral   Take 500 mg by mouth daily.          BP 154/81  Pulse 73  Temp(Src) 98 F (36.7 C)  Resp 16  SpO2 99% Physical Exam  Nursing note and vitals reviewed. Constitutional: She is oriented to person, place, and time. She appears well-developed and well-nourished. No distress.  HENT:  Head: Normocephalic and atraumatic.  Right Ear: External ear normal.  Left Ear: External ear normal.  Nose: Nose normal.   Mouth/Throat: Oropharynx is clear and moist. No oropharyngeal exudate.  Eyes: Conjunctivae and EOM are normal. Pupils are equal, round, and reactive to light. Right eye exhibits no discharge. Left eye exhibits no discharge.  Neck: Normal range of motion. Neck supple. No JVD present. No tracheal deviation present. No thyromegaly present.  Cardiovascular: Normal rate, regular rhythm, normal heart sounds and intact distal pulses.  Exam reveals no gallop and no friction rub.   No murmur heard. Pulmonary/Chest: Effort normal and breath sounds normal. No respiratory distress. She has no wheezes. She has no rales. She exhibits no tenderness.  Abdominal: Soft. Bowel sounds are normal. She exhibits no distension. There is no tenderness. There is no rebound and no guarding.  Musculoskeletal: Normal range of motion.  Lymphadenopathy:    She has no cervical adenopathy.  Neurological: She is alert and oriented to person, place, and time. No cranial nerve deficit. Coordination normal.  Skin: Skin is warm. No rash noted. She is not diaphoretic.  Psychiatric: She has a normal mood and affect. Her behavior is normal. Judgment and thought content normal.    ED Course   Procedures (including critical care time)  Labs Reviewed  BASIC METABOLIC PANEL - Abnormal; Notable for the following:    Glucose, Bld 101 (*)    GFR calc non Af Amer 66 (*)    GFR calc Af Amer 76 (*)    All other components within normal limits  CBC WITH DIFFERENTIAL  TROPONIN I  POCT I-STAT TROPONIN I   Dg Chest 2 View  09/21/2012   *RADIOLOGY REPORT*  Clinical Data: Sinus pain and congestion.  CHEST - 2 VIEW  Comparison: 08/26/2012 and CT chest 12/04/2004.  Findings: Trachea is midline.  Heart size stable.  Lungs are somewhat low in volume with mild diffuse interstitial prominence and indistinctness, which may be chronic, when compared with 12/04/2004.  No pleural fluid.  Degenerative changes are seen in the spine.  IMPRESSION: Mild  diffuse interstitial prominence and indistinctness are likely chronic.  No definite acute findings.   Original Report Authenticated By: Leanna Battles, M.D.   1. Chest pain     MDM  72 yr old F pt with risk factors of age, smoking, HLD, HTN here with concerning chest pressure. Patient said this started this morning and then resolved on its own. No cough, fever, pleuritic pain. Doubt PE with no pain tachycardia, hypoxia. Doubt PNA but will evaluate with CXR. Will check for anemia. Will screen for ACS with troponin. Patient will likely need admission.  Troponin is negative. EKG reassuring. Will admit   Date: 09/21/2012  Rate: 71  Rhythm: sinus arrhythmia  QRS Axis: normal  Intervals: normal  ST/T Wave abnormalities: normal  Conduction Disutrbances:none  Narrative  Interpretation:   Old EKG Reviewed: unchanged  Case discussed with Dr. Erin Hearing, MD 09/21/12 1135  Sherryl Manges, MD 09/21/12 1136

## 2012-09-21 NOTE — ED Provider Notes (Signed)
72 year old female, history of hypertension, hyperlipidemia, presents to the hospital the complaint of chest pain that occurred prior to arrival over the course of the evening. Expressed as a heaviness or a fullness in her chest. Results prior to arrival and on my exam is clear heart sounds, clear lung sounds, normal pulses and no peripheral edema. Her EKG shows no acute ischemia, I agree with the interpretation is provided by the resident Dr. medication.  I saw and evaluated the patient, reviewed the resident's note and I agree with the findings and plan.  Clinical Impression:  Chest pain.   Vida Roller, MD 09/21/12 1113

## 2012-09-21 NOTE — ED Notes (Signed)
Chest tightness that woke up this am took some tums but did not help and throat feels tight feels need to belch

## 2012-09-21 NOTE — ED Provider Notes (Signed)
I saw and evaluated the patient, reviewed the resident's note and I agree with the findings and plan.  Please see my separate note regarding my evaluation of the patient.  Clinical Impression:  Chest Pain  Vida Roller, MD 09/21/12 [phone]

## 2012-09-21 NOTE — H&P (Signed)
[facility] History and Physical  KAZARIA GAERTNER NWG:[id number] DOB: Jan 02, 1941 DOA: 09/21/2012   PCP: Dorrene German, MD   Chief Complaint: chest pain  HPI:  72 year old female with a history of hypertension, hyperlipidemia, recent stroke, BPPV presents with one-day history of chest discomfort. The patient states that the chest discomfort woke her up from sleep around 2AM on the day of admission. The patient took some TUMS which seemed to help the chest discomfort, but the chest discomfort came back. She had some associated shortness of breath and nausea without any vomiting. The patient also complains of dizziness, but she always has a history of dizziness with her BPPV. She states that this is not worse than usual. Because of the recurrent chest discomfort, the patient will continue the hospital. She denies any fevers, chills, syncope, abdominal pain, dysuria, hematuria, hematochezia, melena. The patient denies any new dysarthria, visual disturbance, focal extremity weakness. The patient was recently discharged from the hospital on 08/29/2011 after an acute cerebellar stroke. The patient was discharged with an increased dose of lisinopril, Lipitor, and her aspirin was switched to Plavix at that time.  In the emergency department, initial troponin and EKG were unrevealing. BMP and CBC were unremarkable. The patient is hemodynamically stable.  Assessment/Plan: Atypical chest pain -The patient has numerous risk factors including her age, hyperlipidemia, tobacco use, and concomitant ASVD (recent stroke) -[facility] has been consulted for possible risk stratification -Hemoglobin A1c 6.0 on 08/26/2012 -LDL 180 on 08/26/2012 -TSH 3.501 on 08/26/2012 -Cycle troponins Tobacco use -Tobacco cessation discussed -Patient has approximately 20 pk yrs Recent cerebellar stroke -Documented on 08/25/2012 MRI brain -Continue Plavix -Continue statin Impaired glucose tolerance -08/26/2012  hemoglobin A1c 6.0 -NovoLog sliding scale Hypertension -Continue lisinopril -May need to add additional agents -Continue to monitor       Past Medical History  Diagnosis Date  . Hypertension   . Dyslipidemia 08/25/2012  . Essential hypertension, benign 08/25/2012  . CVA (cerebral infarction) 08/25/2012  . Benign paroxysmal positional vertigo 08/26/2012   History reviewed. No pertinent past surgical history. Social History:  reports that she has quit smoking. She does not have any smokeless tobacco history on file. She reports that she does not drink alcohol or use illicit drugs.   History reviewed. No pertinent family history.   No Known Allergies    Prior to Admission medications   Medication Sig Start Date End Date Taking? Authorizing Provider  Alcaftadine (LASTACAFT) 0.25 % SOLN Apply 1 drop to eye every evening. One drop in each eye   Yes Historical Provider, MD  amitriptyline (ELAVIL) 10 MG tablet Take 10 mg by mouth at bedtime.   Yes Historical Provider, MD  atorvastatin (LIPITOR) 40 MG tablet Take 1 tablet (40 mg total) by mouth daily. 08/28/12  Yes Rodolph Bong, MD  cetirizine (ZYRTEC) 10 MG tablet Take 10 mg by mouth daily.   Yes Historical Provider, MD  clopidogrel (PLAVIX) 75 MG tablet Take 1 tablet (75 mg total) by mouth daily with breakfast. 08/28/12  Yes Rodolph Bong, MD  lisinopril (PRINIVIL,ZESTRIL) 40 MG tablet Take 1 tablet (40 mg total) by mouth daily. 08/28/12  Yes Rodolph Bong, MD  meclizine (ANTIVERT) 25 MG tablet Take 25 mg by mouth 3 (three) times daily.    Yes Historical Provider, MD  vitamin C (ASCORBIC ACID) 500 MG tablet Take 500 mg by mouth daily.   Yes Historical Provider, MD    Review of Systems:  Constitutional:  No weight loss, night  sweats, Fevers, chills, fatigue.  Head&Eyes: No headache.  No vision loss.  No eye pain or scotoma ENT:  No Difficulty swallowing,Tooth/dental problems,Sore throat,  No ear ache, post nasal drip,   Cardio-vascular:  No chest pain, Orthopnea, PND, swelling in lower extremities,  dizziness, palpitations  GI:  No  abdominal pain, nausea, vomiting, diarrhea, loss of appetite, hematochezia, melena, heartburn, indigestion, Resp:  No shortness of breath with exertion or at rest. No cough. No coughing up of blood .No wheezing.No chest wall deformity  Skin:  no rash or lesions.  GU:  no dysuria, change in color of urine, no urgency or frequency. No flank pain.  Musculoskeletal:  No joint pain or swelling. No decreased range of motion. No back pain.  Psych:  No change in mood or affect. No depression or anxiety. Neurologic: No headache, no dysesthesia, no focal weakness, no vision loss. No syncope  Physical Exam: Filed Vitals:   09/21/12 0926  BP: 154/81  Pulse: 73  Temp: 98 F (36.7 C)  Resp: 16  SpO2: 99%   General:  A&O x 3, NAD, nontoxic, pleasant/cooperative Head/Eye: No conjunctival hemorrhage, no icterus, New Post/AT, No nystagmus ENT:  No icterus,  No thrush, good dentition, no pharyngeal exudate Neck:  No masses, no lymphadenpathy, no bruits CV:  RRR, no rub, no gallop, no S3 Lung:  CTAB, good air movement, no wheeze, no rhonchi Abdomen: soft/NT, +BS, nondistended, no peritoneal signs Ext: No cyanosis, No rashes, No petechiae, No lymphangitis, No edema Neuro: CNII-XII intact, strength 4/5 in bilateral upper and lower extremities, no dysmetria  Labs on Admission:  Basic Metabolic Panel:  Recent Labs Lab 09/21/12 0943  NA 141  K 4.6  CL 105  CO2 24  GLUCOSE 101*  BUN 6  CREATININE 0.86  CALCIUM 10.2   Liver Function Tests: No results found for this basename: AST, ALT, ALKPHOS, BILITOT, PROT, ALBUMIN,  in the last 168 hours No results found for this basename: LIPASE, AMYLASE,  in the last 168 hours No results found for this basename: AMMONIA,  in the last 168 hours CBC:  Recent Labs Lab 09/21/12 0943  WBC 6.7  NEUTROABS 4.0  HGB 12.6  HCT 36.5  MCV 86.9   PLT 275   Cardiac Enzymes:  Recent Labs Lab 09/21/12 1019  TROPONINI <0.30   BNP: No components found with this basename: POCBNP,  CBG: No results found for this basename: GLUCAP,  in the last 168 hours  Radiological Exams on Admission: Dg Chest 2 View  09/21/2012   *RADIOLOGY REPORT*  Clinical Data: Sinus pain and congestion.  CHEST - 2 VIEW  Comparison: 08/26/2012 and CT chest 12/04/2004.  Findings: Trachea is midline.  Heart size stable.  Lungs are somewhat low in volume with mild diffuse interstitial prominence and indistinctness, which may be chronic, when compared with 12/04/2004.  No pleural fluid.  Degenerative changes are seen in the spine.  IMPRESSION: Mild diffuse interstitial prominence and indistinctness are likely chronic.  No definite acute findings.   Original Report Authenticated By: Leanna Battles, M.D.    EKG: Independently reviewed. Sinus rhythm, no ST-T wave changes    Time spent:60 minutes Code Status:   Full Family Communication:   Caregiver at bedside   Nikcole Eischeid, DO  [facility] Pager [phone]  If 7PM-7AM, please contact night-coverage [email or web address] Password [facility] 09/21/2012, 11:55 AM

## 2012-09-21 NOTE — Consult Note (Signed)
CARDIOLOGY CONSULT NOTE    Patient ID: KAEDANCE MAGOS MRN: [id number] DOB/AGE: April 17, 1940 72 y.o.  Admit date: 09/21/2012 Referring Physician:  Tat Primary Physician: Dorrene German, MD Primary Cardiologist:  New Reason for Consultation:  Chest Pain  Active Problems:   CVA (cerebral infarction)   Essential hypertension, benign   Dyslipidemia   Benign paroxysmal positional vertigo   Chest pain   Tobacco abuse disorder   HPI:   72 yo with elevated lipids HTN.  Long standing vertigo  Worse about a month ago and diagnosed with posterior circulation stroke  D/C with Plavix.  MRA with severe basillar disease Occluded left vertebral and high grade right.  No ICA or anterior circulation disease.  Long standing GERD and reflux.  Worse in recumbancy at night.  Awoke this am with chest and epigastric pain. Some relief with tums but returned and worse than usual.  No nausea or vohmiting No history of CAD  Currently feels better.  Has issues with gait and walking from chronic vetigo and recent stroke Not able to walk on treadmill  Has been compliant with meds since discharge.    @ROS @ All other systems reviewed and negative except as noted above  Past Medical History  Diagnosis Date  . Hypertension   . Dyslipidemia 08/25/2012  . Essential hypertension, benign 08/25/2012  . CVA (cerebral infarction) 08/25/2012  . Benign paroxysmal positional vertigo 08/26/2012    History reviewed. No pertinent family history.  History   Social History  . Marital Status: Divorced    Spouse Name: N/A    Number of Children: N/A  . Years of Education: N/A   Occupational History  . Not on file.   Social History Main Topics  . Smoking status: Former Games developer  . Smokeless tobacco: Not on file  . Alcohol Use: No  . Drug Use: No  . Sexual Activity: No   Other Topics Concern  . Not on file   Social History Narrative  . No narrative on file    History reviewed. No pertinent past surgical history.    Marland Kitchen amitriptyline  10 mg Oral QHS  . atorvastatin  40 mg Oral q1800  . clopidogrel  75 mg Oral Q breakfast  . enoxaparin (LOVENOX) injection  40 mg Subcutaneous Q24H  . insulin aspart  0-9 Units Subcutaneous TID WC  . ketotifen  1 drop Both Eyes BID  . lisinopril  40 mg Oral Daily  . loratadine  10 mg Oral Daily  . meclizine  25 mg Oral TID  . sodium chloride  3 mL Intravenous Q12H  . vitamin C  500 mg Oral Daily      Physical Exam: Blood pressure 140/69, pulse 58, temperature 98.4 F (36.9 C), temperature source Oral, resp. rate 18, height 5\' 5"  (1.651 m), weight 159 lb 14.4 oz (72.53 kg), SpO2 100.00%.   Affect appropriate Elderly black female  HEENT: normal Neck supple with no adenopathy JVP normal no bruits no thyromegaly Lungs clear with no wheezing and good diaphragmatic motion Heart:  S1/S2 no murmur, no rub, gallop or click PMI normal Abdomen: benighn, BS positve, no tenderness, no AAA no bruit.  No HSM or HJR Distal pulses intact with no bruits No edema Neuro non-focal Skin warm and dry No muscular weakness   Labs:   Lab Results  Component Value Date   WBC 6.7 09/21/2012   HGB 12.6 09/21/2012   HCT 36.5 09/21/2012   MCV 86.9 09/21/2012   PLT 275 09/21/2012  Recent Labs Lab 09/21/12 0943  NA 141  K 4.6  CL 105  CO2 24  BUN 6  CREATININE 0.86  CALCIUM 10.2  GLUCOSE 101*   Lab Results  Component Value Date   TROPONINI <0.30 09/21/2012    Lab Results  Component Value Date   CHOL 271* 08/26/2012   Lab Results  Component Value Date   HDL 31* 08/26/2012   Lab Results  Component Value Date   LDLCALC 180* 08/26/2012   Lab Results  Component Value Date   TRIG 301* 08/26/2012   Lab Results  Component Value Date   CHOLHDL 8.7 08/26/2012   No results found for this basename: LDLDIRECT      Radiology: Dg Chest 2 View  09/21/2012   *RADIOLOGY REPORT*  Clinical Data: Sinus pain and congestion.  CHEST - 2 VIEW  Comparison: 08/26/2012 and CT chest  12/04/2004.  Findings: Trachea is midline.  Heart size stable.  Lungs are somewhat low in volume with mild diffuse interstitial prominence and indistinctness, which may be chronic, when compared with 12/04/2004.  No pleural fluid.  Degenerative changes are seen in the spine.  IMPRESSION: Mild diffuse interstitial prominence and indistinctness are likely chronic.  No definite acute findings.   Original Report Authenticated By: Leanna Battles, M.D.   Dg Chest 2 View  08/26/2012   *RADIOLOGY REPORT*  Clinical Data: Stroke, weakness  CHEST - 2 VIEW  Comparison: 12/04/2004 CT scan and 12/04/2004 chest radiograph  Findings: Heart size and vascular pattern are normal.  Lungs are clear.  No change from prior studies.  IMPRESSION: No acute findings   Original Report Authenticated By: Esperanza Heir, M.D.   Mr [facility] Wo Contrast  08/27/2012   *RADIOLOGY REPORT*  Clinical Data:  Punctate cerebellar infarcts.  MRA HEAD WITHOUT CONTRAST  Technique:  Angiographic images of the Circle of Willis were obtained using MRA technique without intravenous contrast.  Comparison:  MRI brain 08/25/2012.  Findings:  Mild atherosclerotic irregularities present in the cavernous carotid arteries bilaterally without to a significant stenosis.  Prominent posterior communicating arteries are present bilaterally.  The there is a moderate stenosis in the proximal right A1 segment.  The left A1 segment is patent.  The M1 segments are normal bilaterally.  The anterior communicating artery is patent.  The MCA bifurcations are intact but there is significant branch vessel attenuation bilaterally, right greater than left.  There is a severe stenosis of the distal right vertebral artery proximal to the PICA.  To the left vertebral artery is hypoplastic with segmental stenoses and no significant flow beyond the skull base.  The proximal basilar artery demonstrates no flow.  There is minimal flow and a small distal basilar artery.  Both posterior  cerebral arteries are of fetal type with some attenuation of distal branch vessels.  IMPRESSION:  1.  Severe vertebral basilar disease without significant flow signal identified in the proximal basilar artery. 2.  High-grade distal right vertebral artery stenosis. 3.  Occlusion of the nondominant left vertebral artery. 4.  Moderate small vessel disease within the anterior circulation.  *RADIOLOGY REPORT*  MRI NECK WITHOUT AND WITH CONTRAST  Technique:  Multiplanar, multiecho pulse sequences of the neck and surrounding structures were obtained without and with intravenous contrast.  Contrast: 15mL MULTIHANCE GADOBENATE DIMEGLUMINE 529 MG/ML IV SOLN  Findings:  The time-of-flight images demonstrates moderate tortuosity of the proximal internal carotid arteries bilaterally without significant flow disturbance.  Antegrade flow is present in the right vertebral artery.  There is a common origin of the left common carotid artery and the innominate artery, a normal variant.  Focal signal loss at the origin of the right vertebral artery suggests a high-grade stenosis.  The there is segmental opacification of the left vertebral artery without significant signal proximally.  This suggests proximal occlusion with reconstitution via muscular branches.  The vessel terminates at the left PICA.  A high-grade stenosis of the distal right vertebral artery, proximal to the PICA is confirmed.  Moderate to severe stenoses are present proximally within a hypoplastic basilar artery.  The right common carotid artery is within normal limits.  The bifurcation demonstrates minimal atherosclerotic change without significant stenosis.  There is tortuosity of the proximal right internal carotid artery without a significant stenosis.  A fetal type right posterior cerebral artery is present.  The left common carotid artery is tortuous proximally.  Mild atherosclerotic irregularity is present at the carotid bifurcation without a significant  stenosis.  There is tortuosity within the proximal left internal carotid artery as well.  No significant ICA stenosis is present.  A fetal type left posterior cerebral artery is present.  IMPRESSION:  1.  High-grade stenosis of the right vertebral artery origin, the dominant vessel. 2.  High-grade stenosis of the distal right vertebral artery, just proximal to the PICA. 3.  Occlusion of the left vertebral artery with faint reconstitution likely by musculoskeletal branches. 4.  Segmental stenoses of the residual left vertebral artery without to signal beyond the PICA.  This may represent a congenitally small vessel which terminates at the PICA versus a distal occlusion. 5.  Tortuous common carotid arteries and internal carotid arteries without significant stenoses in the anterior circulation. 6.  Fetal type posterior cerebral arteries bilaterally. 7. Moderate to severe stenoses within the proximal hypoplastic basilar artery.   Original Report Authenticated By: Marin Roberts, M.D.   Mr Angiogram Neck W Wo Contrast  08/27/2012   *RADIOLOGY REPORT*  Clinical Data:  Punctate cerebellar infarcts.  MRA HEAD WITHOUT CONTRAST  Technique:  Angiographic images of the Circle of Willis were obtained using MRA technique without intravenous contrast.  Comparison:  MRI brain 08/25/2012.  Findings:  Mild atherosclerotic irregularities present in the cavernous carotid arteries bilaterally without to a significant stenosis.  Prominent posterior communicating arteries are present bilaterally.  The there is a moderate stenosis in the proximal right A1 segment.  The left A1 segment is patent.  The M1 segments are normal bilaterally.  The anterior communicating artery is patent.  The MCA bifurcations are intact but there is significant branch vessel attenuation bilaterally, right greater than left.  There is a severe stenosis of the distal right vertebral artery proximal to the PICA.  To the left vertebral artery is hypoplastic  with segmental stenoses and no significant flow beyond the skull base.  The proximal basilar artery demonstrates no flow.  There is minimal flow and a small distal basilar artery.  Both posterior cerebral arteries are of fetal type with some attenuation of distal branch vessels.  IMPRESSION:  1.  Severe vertebral basilar disease without significant flow signal identified in the proximal basilar artery. 2.  High-grade distal right vertebral artery stenosis. 3.  Occlusion of the nondominant left vertebral artery. 4.  Moderate small vessel disease within the anterior circulation.  *RADIOLOGY REPORT*  MRI NECK WITHOUT AND WITH CONTRAST  Technique:  Multiplanar, multiecho pulse sequences of the neck and surrounding structures were obtained without and with intravenous contrast.  Contrast: 15mL MULTIHANCE GADOBENATE DIMEGLUMINE  529 MG/ML IV SOLN  Findings:  The time-of-flight images demonstrates moderate tortuosity of the proximal internal carotid arteries bilaterally without significant flow disturbance.  Antegrade flow is present in the right vertebral artery.  There is a common origin of the left common carotid artery and the innominate artery, a normal variant.  Focal signal loss at the origin of the right vertebral artery suggests a high-grade stenosis.  The there is segmental opacification of the left vertebral artery without significant signal proximally.  This suggests proximal occlusion with reconstitution via muscular branches.  The vessel terminates at the left PICA.  A high-grade stenosis of the distal right vertebral artery, proximal to the PICA is confirmed.  Moderate to severe stenoses are present proximally within a hypoplastic basilar artery.  The right common carotid artery is within normal limits.  The bifurcation demonstrates minimal atherosclerotic change without significant stenosis.  There is tortuosity of the proximal right internal carotid artery without a significant stenosis.  A fetal type right  posterior cerebral artery is present.  The left common carotid artery is tortuous proximally.  Mild atherosclerotic irregularity is present at the carotid bifurcation without a significant stenosis.  There is tortuosity within the proximal left internal carotid artery as well.  No significant ICA stenosis is present.  A fetal type left posterior cerebral artery is present.  IMPRESSION:  1.  High-grade stenosis of the right vertebral artery origin, the dominant vessel. 2.  High-grade stenosis of the distal right vertebral artery, just proximal to the PICA. 3.  Occlusion of the left vertebral artery with faint reconstitution likely by musculoskeletal branches. 4.  Segmental stenoses of the residual left vertebral artery without to signal beyond the PICA.  This may represent a congenitally small vessel which terminates at the PICA versus a distal occlusion. 5.  Tortuous common carotid arteries and internal carotid arteries without significant stenoses in the anterior circulation. 6.  Fetal type posterior cerebral arteries bilaterally. 7. Moderate to severe stenoses within the proximal hypoplastic basilar artery.   Original Report Authenticated By: Marin Roberts, M.D.   Mr Brain Wo Contrast  08/25/2012   *RADIOLOGY REPORT*  Clinical Data: Vertigo of 2 weeks dizziness  MRI HEAD WITHOUT CONTRAST  Technique:  Multiplanar, multiecho pulse sequences of the brain and surrounding structures were obtained according to standard protocol without intravenous contrast.  Comparison: CT 07/22/2007  Findings: Small area of restricted diffusion left inferior cerebellum compatible with acute infarct measuring 5 x 7 mm. Additional sub centimeter area of acute infarct in the right superior cerebellum.  Ventricle size is normal.  Small areas of chronic infarction in the cerebellum bilaterally.  Negative for hemorrhage or mass lesion.  No shift to the midline structures.  Paranasal sinuses are clear.  IMPRESSION: Small areas of  acute and chronic infarct in the cerebellum bilaterally.   Original Report Authenticated By: Janeece Riggers, M.D.    EKG:  NSR rate 71 normal low voltage from body habitus   ASSESSMENT AND PLAN:  Chest Pain:  Atypical with GI overtones.  Multiple CRF;s and recent posterior circulation stroke.  Unable to walk on treadmill due to vertigo and age.  Lexiscan myovue for am HTN:  Continue lisinopril Stroke:  Continue plavix    On mclizine  Chol:  Continue statin  GERD:  Takes a lot of tums at proton pump inhibitor and avoid late night eating before bed Signed: Charlton Haws 09/21/2012, 6:18 PM

## 2012-09-22 ENCOUNTER — Observation Stay (HOSPITAL_COMMUNITY): Payer: Medicare Other

## 2012-09-22 DIAGNOSIS — F17200 Nicotine dependence, unspecified, uncomplicated: Secondary | ICD-10-CM

## 2012-09-22 DIAGNOSIS — R079 Chest pain, unspecified: Secondary | ICD-10-CM

## 2012-09-22 LAB — BASIC METABOLIC PANEL
BUN: 6 mg/dL (ref 6–23)
CO2: 26 mEq/L (ref 19–32)
Calcium: 9.3 mg/dL (ref 8.4–10.5)
Glucose, Bld: 100 mg/dL — ABNORMAL HIGH (ref 70–99)
Sodium: 143 mEq/L (ref 135–145)

## 2012-09-22 MED ORDER — REGADENOSON 0.4 MG/5ML IV SOLN
INTRAVENOUS | Status: AC
  Administered 2012-09-22: 0.4 mg
  Filled 2012-09-22: qty 5

## 2012-09-22 MED ORDER — TECHNETIUM TC 99M SESTAMIBI GENERIC - CARDIOLITE
30.0000 | Freq: Once | INTRAVENOUS | Status: AC | PRN
  Administered 2012-09-22: 30 via INTRAVENOUS

## 2012-09-22 MED ORDER — TECHNETIUM TC 99M SESTAMIBI GENERIC - CARDIOLITE
10.0000 | Freq: Once | INTRAVENOUS | Status: AC | PRN
  Administered 2012-09-22: 10 via INTRAVENOUS

## 2012-09-22 NOTE — Discharge Summary (Signed)
Physician Discharge Summary  Donna Gilmore JYN:[id number] DOB: 12-13-1940 DOA: 09/21/2012  PCP: Dorrene German, MD  Admit date: 09/21/2012 Discharge date: 09/22/2012  Recommendations for Outpatient Follow-up:  1. Pt will need to follow up with PCP in 2 weeks post discharge 2. Please obtain BMP to evaluate electrolytes and kidney function 3. Please also check CBC to evaluate Hg and Hct levels  Discharge Diagnoses:  Active Problems:   CVA (cerebral infarction)   Essential hypertension, benign   Dyslipidemia   Benign paroxysmal positional vertigo   Chest pain   Tobacco abuse disorder Atypical chest pain  -The patient has numerous risk factors including her age, hyperlipidemia, tobacco use, and concomitant ASVD (recent stroke)  -[facility] has been consulted for possible risk stratification  -Hemoglobin A1c 6.0 on 08/26/2012  -LDL 180 on 08/26/2012  -TSH 3.501 on 08/26/2012  -Cycle troponins--negative -Cardiology was consulted for stratification -09/22/2012 Nuclear medicine stress test was negative, EF 75% -Chest discomfort may been related to the patient's GERD. The patient was encouraged to take omeprazole 20 mg daily Tobacco use  -Tobacco cessation discussed  -Patient has approximately 20 pk yrs  Recent cerebellar stroke  -Documented on 08/25/2012 MRI brain  -Continue Plavix  -Continue statin  Impaired glucose tolerance  -08/26/2012 hemoglobin A1c 6.0  Hypertension  -Continue lisinopril  -May need to add additional agents  -Pt will need  followup with her primary care provider for future adjustment of her antihypertensive regimen.   Discharge Condition: stable  Disposition: home  Diet:heart healthy Wt Readings from Last 3 Encounters:  09/21/12 72.53 kg (159 lb 14.4 oz)  08/25/12 73.6 kg (162 lb 4.1 oz)    History of present illness:  72 year old female with a history of hypertension, hyperlipidemia, recent stroke, BPPV presents with one-day history of  chest discomfort. The patient states that the chest discomfort woke her up from sleep around 2AM on the day of admission. The patient took some TUMS which seemed to help the chest discomfort, but the chest discomfort came back. She had some associated shortness of breath and nausea without any vomiting. The patient also complains of dizziness, but she always has a history of dizziness with her BPPV. She states that this is not worse than usual. Because of the recurrent chest discomfort, the patient will continue the hospital. She denies any fevers, chills, syncope, abdominal pain, dysuria, hematuria, hematochezia, melena. The patient denies any new dysarthria, visual disturbance, focal extremity weakness. The patient was recently discharged from the hospital on 08/29/2011 after an acute cerebellar stroke. The patient was discharged with an increased dose of lisinopril, Lipitor, and her aspirin was switched to Plavix at that time.  In the emergency department, initial troponin and EKG were unrevealing. BMP and CBC were unremarkable. The patient is hemodynamically stable. Cardiology was consulted to see the patient. He recommended a nuclear medicine stress test. This was obtained on 09/22/2012. Was negative. EF 75%. The patient remained chest pain-free without any hemodynamic optimized during the hospitalization.    Consultants: Cardiology-Dr. Eden Emms  Discharge Exam: Filed Vitals:   09/22/12 1329  BP: 115/86  Pulse: 71  Temp: 98.3 F (36.8 C)  Resp: 18   Filed Vitals:   09/22/12 0901 09/22/12 0903 09/22/12 1030 09/22/12 1329  BP: 131/64 130/65 120/53 115/86  Pulse: 90 82  71  Temp:    98.3 F (36.8 C)  TempSrc:    Oral  Resp:    18  Height:      Weight:  SpO2:    100%   General: A&O x 3, NAD, pleasant, cooperative Cardiovascular: RRR, no rub, no gallop, no S3 Respiratory: CTAB, no wheeze, no rhonchi Abdomen:soft, nontender, nondistended, positive bowel sounds Extremities: No edema,  No lymphangitis, no petechiae  Discharge Instructions  Discharge Orders   Future Appointments Provider Department Dept Phone   11/09/2012 2:00 PM Ronal Fear, NP [facility] [phone]   Future Orders Complete By Expires   Diet - low sodium heart healthy  As directed    Increase activity slowly  As directed        Medication List         amitriptyline 10 MG tablet  Commonly known as:  ELAVIL  Take 10 mg by mouth at bedtime.     atorvastatin 40 MG tablet  Commonly known as:  LIPITOR  Take 1 tablet (40 mg total) by mouth daily.     cetirizine 10 MG tablet  Commonly known as:  ZYRTEC  Take 10 mg by mouth daily.     clopidogrel 75 MG tablet  Commonly known as:  PLAVIX  Take 1 tablet (75 mg total) by mouth daily with breakfast.     LASTACAFT 0.25 % Soln  Generic drug:  Alcaftadine  Apply 1 drop to eye every evening. One drop in each eye     lisinopril 40 MG tablet  Commonly known as:  PRINIVIL,ZESTRIL  Take 1 tablet (40 mg total) by mouth daily.     meclizine 25 MG tablet  Commonly known as:  ANTIVERT  Take 25 mg by mouth 3 (three) times daily.     vitamin C 500 MG tablet  Commonly known as:  ASCORBIC ACID  Take 500 mg by mouth daily.         The results of significant diagnostics from this hospitalization (including imaging, microbiology, ancillary and laboratory) are listed below for reference.    Significant Diagnostic Studies: Dg Chest 2 View  09/21/2012   *RADIOLOGY REPORT*  Clinical Data: Sinus pain and congestion.  CHEST - 2 VIEW  Comparison: 08/26/2012 and CT chest 12/04/2004.  Findings: Trachea is midline.  Heart size stable.  Lungs are somewhat low in volume with mild diffuse interstitial prominence and indistinctness, which may be chronic, when compared with 12/04/2004.  No pleural fluid.  Degenerative changes are seen in the spine.  IMPRESSION: Mild diffuse interstitial prominence and indistinctness are likely chronic.  No definite  acute findings.   Original Report Authenticated By: Leanna Battles, M.D.   Dg Chest 2 View  08/26/2012   *RADIOLOGY REPORT*  Clinical Data: Stroke, weakness  CHEST - 2 VIEW  Comparison: 12/04/2004 CT scan and 12/04/2004 chest radiograph  Findings: Heart size and vascular pattern are normal.  Lungs are clear.  No change from prior studies.  IMPRESSION: No acute findings   Original Report Authenticated By: Esperanza Heir, M.D.   Mr [facility] Wo Contrast  08/27/2012   *RADIOLOGY REPORT*  Clinical Data:  Punctate cerebellar infarcts.  MRA HEAD WITHOUT CONTRAST  Technique:  Angiographic images of the Circle of Willis were obtained using MRA technique without intravenous contrast.  Comparison:  MRI brain 08/25/2012.  Findings:  Mild atherosclerotic irregularities present in the cavernous carotid arteries bilaterally without to a significant stenosis.  Prominent posterior communicating arteries are present bilaterally.  The there is a moderate stenosis in the proximal right A1 segment.  The left A1 segment is patent.  The M1 segments are normal bilaterally.  The anterior communicating  artery is patent.  The MCA bifurcations are intact but there is significant branch vessel attenuation bilaterally, right greater than left.  There is a severe stenosis of the distal right vertebral artery proximal to the PICA.  To the left vertebral artery is hypoplastic with segmental stenoses and no significant flow beyond the skull base.  The proximal basilar artery demonstrates no flow.  There is minimal flow and a small distal basilar artery.  Both posterior cerebral arteries are of fetal type with some attenuation of distal branch vessels.  IMPRESSION:  1.  Severe vertebral basilar disease without significant flow signal identified in the proximal basilar artery. 2.  High-grade distal right vertebral artery stenosis. 3.  Occlusion of the nondominant left vertebral artery. 4.  Moderate small vessel disease within the anterior  circulation.  *RADIOLOGY REPORT*  MRI NECK WITHOUT AND WITH CONTRAST  Technique:  Multiplanar, multiecho pulse sequences of the neck and surrounding structures were obtained without and with intravenous contrast.  Contrast: 15mL MULTIHANCE GADOBENATE DIMEGLUMINE 529 MG/ML IV SOLN  Findings:  The time-of-flight images demonstrates moderate tortuosity of the proximal internal carotid arteries bilaterally without significant flow disturbance.  Antegrade flow is present in the right vertebral artery.  There is a common origin of the left common carotid artery and the innominate artery, a normal variant.  Focal signal loss at the origin of the right vertebral artery suggests a high-grade stenosis.  The there is segmental opacification of the left vertebral artery without significant signal proximally.  This suggests proximal occlusion with reconstitution via muscular branches.  The vessel terminates at the left PICA.  A high-grade stenosis of the distal right vertebral artery, proximal to the PICA is confirmed.  Moderate to severe stenoses are present proximally within a hypoplastic basilar artery.  The right common carotid artery is within normal limits.  The bifurcation demonstrates minimal atherosclerotic change without significant stenosis.  There is tortuosity of the proximal right internal carotid artery without a significant stenosis.  A fetal type right posterior cerebral artery is present.  The left common carotid artery is tortuous proximally.  Mild atherosclerotic irregularity is present at the carotid bifurcation without a significant stenosis.  There is tortuosity within the proximal left internal carotid artery as well.  No significant ICA stenosis is present.  A fetal type left posterior cerebral artery is present.  IMPRESSION:  1.  High-grade stenosis of the right vertebral artery origin, the dominant vessel. 2.  High-grade stenosis of the distal right vertebral artery, just proximal to the PICA. 3.   Occlusion of the left vertebral artery with faint reconstitution likely by musculoskeletal branches. 4.  Segmental stenoses of the residual left vertebral artery without to signal beyond the PICA.  This may represent a congenitally small vessel which terminates at the PICA versus a distal occlusion. 5.  Tortuous common carotid arteries and internal carotid arteries without significant stenoses in the anterior circulation. 6.  Fetal type posterior cerebral arteries bilaterally. 7. Moderate to severe stenoses within the proximal hypoplastic basilar artery.   Original Report Authenticated By: Marin Roberts, M.D.   Mr Angiogram Neck W Wo Contrast  08/27/2012   *RADIOLOGY REPORT*  Clinical Data:  Punctate cerebellar infarcts.  MRA HEAD WITHOUT CONTRAST  Technique:  Angiographic images of the Circle of Willis were obtained using MRA technique without intravenous contrast.  Comparison:  MRI brain 08/25/2012.  Findings:  Mild atherosclerotic irregularities present in the cavernous carotid arteries bilaterally without to a significant stenosis.  Prominent posterior communicating  arteries are present bilaterally.  The there is a moderate stenosis in the proximal right A1 segment.  The left A1 segment is patent.  The M1 segments are normal bilaterally.  The anterior communicating artery is patent.  The MCA bifurcations are intact but there is significant branch vessel attenuation bilaterally, right greater than left.  There is a severe stenosis of the distal right vertebral artery proximal to the PICA.  To the left vertebral artery is hypoplastic with segmental stenoses and no significant flow beyond the skull base.  The proximal basilar artery demonstrates no flow.  There is minimal flow and a small distal basilar artery.  Both posterior cerebral arteries are of fetal type with some attenuation of distal branch vessels.  IMPRESSION:  1.  Severe vertebral basilar disease without significant flow signal identified in  the proximal basilar artery. 2.  High-grade distal right vertebral artery stenosis. 3.  Occlusion of the nondominant left vertebral artery. 4.  Moderate small vessel disease within the anterior circulation.  *RADIOLOGY REPORT*  MRI NECK WITHOUT AND WITH CONTRAST  Technique:  Multiplanar, multiecho pulse sequences of the neck and surrounding structures were obtained without and with intravenous contrast.  Contrast: 15mL MULTIHANCE GADOBENATE DIMEGLUMINE 529 MG/ML IV SOLN  Findings:  The time-of-flight images demonstrates moderate tortuosity of the proximal internal carotid arteries bilaterally without significant flow disturbance.  Antegrade flow is present in the right vertebral artery.  There is a common origin of the left common carotid artery and the innominate artery, a normal variant.  Focal signal loss at the origin of the right vertebral artery suggests a high-grade stenosis.  The there is segmental opacification of the left vertebral artery without significant signal proximally.  This suggests proximal occlusion with reconstitution via muscular branches.  The vessel terminates at the left PICA.  A high-grade stenosis of the distal right vertebral artery, proximal to the PICA is confirmed.  Moderate to severe stenoses are present proximally within a hypoplastic basilar artery.  The right common carotid artery is within normal limits.  The bifurcation demonstrates minimal atherosclerotic change without significant stenosis.  There is tortuosity of the proximal right internal carotid artery without a significant stenosis.  A fetal type right posterior cerebral artery is present.  The left common carotid artery is tortuous proximally.  Mild atherosclerotic irregularity is present at the carotid bifurcation without a significant stenosis.  There is tortuosity within the proximal left internal carotid artery as well.  No significant ICA stenosis is present.  A fetal type left posterior cerebral artery is present.   IMPRESSION:  1.  High-grade stenosis of the right vertebral artery origin, the dominant vessel. 2.  High-grade stenosis of the distal right vertebral artery, just proximal to the PICA. 3.  Occlusion of the left vertebral artery with faint reconstitution likely by musculoskeletal branches. 4.  Segmental stenoses of the residual left vertebral artery without to signal beyond the PICA.  This may represent a congenitally small vessel which terminates at the PICA versus a distal occlusion. 5.  Tortuous common carotid arteries and internal carotid arteries without significant stenoses in the anterior circulation. 6.  Fetal type posterior cerebral arteries bilaterally. 7. Moderate to severe stenoses within the proximal hypoplastic basilar artery.   Original Report Authenticated By: Marin Roberts, M.D.   Mr Brain Wo Contrast  08/25/2012   *RADIOLOGY REPORT*  Clinical Data: Vertigo of 2 weeks dizziness  MRI HEAD WITHOUT CONTRAST  Technique:  Multiplanar, multiecho pulse sequences of the brain and surrounding  structures were obtained according to standard protocol without intravenous contrast.  Comparison: CT 07/22/2007  Findings: Small area of restricted diffusion left inferior cerebellum compatible with acute infarct measuring 5 x 7 mm. Additional sub centimeter area of acute infarct in the right superior cerebellum.  Ventricle size is normal.  Small areas of chronic infarction in the cerebellum bilaterally.  Negative for hemorrhage or mass lesion.  No shift to the midline structures.  Paranasal sinuses are clear.  IMPRESSION: Small areas of acute and chronic infarct in the cerebellum bilaterally.   Original Report Authenticated By: Janeece Riggers, M.D.   Nm Myocar Multi W/spect W/wall Motion / Ef  09/22/2012   Lexiscan Myovue:  Indication: Chest Pain  The patient received .4mg  of lexiscan as a bolus Baseline ECG was normal.  No changes with infusion and no significant symptoms Hemodynamics stable HR 99 and BP  130/65 mmHg.  Images reconstructed in vertical horizontal and short axis planes.  RAW: Some motion QPS: normal EF: 73% normal with no RWMA;s  SPECT:  Normal perfusion in both resting and stress images  Impression:  Normal Lexiscan myovue EF 73%  Charlton Haws MD [facility]   Original Report Authenticated By: Charlton Haws, M.D.     Microbiology: No results found for this or any previous visit (from the past 240 hour(s)).   Labs: Basic Metabolic Panel:  Recent Labs Lab 09/21/12 0943 09/22/12 0438  NA 141 143  K 4.6 3.7  CL 105 108  CO2 24 26  GLUCOSE 101* 100*  BUN 6 6  CREATININE 0.86 0.84  CALCIUM 10.2 9.3   Liver Function Tests: No results found for this basename: AST, ALT, ALKPHOS, BILITOT, PROT, ALBUMIN,  in the last 168 hours No results found for this basename: LIPASE, AMYLASE,  in the last 168 hours No results found for this basename: AMMONIA,  in the last 168 hours CBC:  Recent Labs Lab 09/21/12 0943  WBC 6.7  NEUTROABS 4.0  HGB 12.6  HCT 36.5  MCV 86.9  PLT 275   Cardiac Enzymes:  Recent Labs Lab 09/21/12 1019 09/21/12 1557 09/21/12 2215  TROPONINI <0.30 <0.30 <0.30   BNP: No components found with this basename: POCBNP,  CBG:  Recent Labs Lab 09/22/12 1222  GLUCAP 85    Time coordinating discharge:  Greater than 30 minutes  Signed:  Dorie Ohms, DO [facility] Pager: [phone] 09/22/2012, 3:36 PM

## 2012-09-22 NOTE — Progress Notes (Signed)
Patient ID: Donna Gilmore, female   DOB: 17-Sep-1940, 72 y.o.   MRN: [id number]    Subjective:  Denies SSCP, palpitations or Dyspnea   Objective:  Filed Vitals:   09/21/12 1300 09/21/12 1712 09/21/12 1952 09/22/12 0551  BP: 140/69  128/47 143/62  Pulse: 58  68 63  Temp: 98.4 F (36.9 C)  98.5 F (36.9 C) 98.2 F (36.8 C)  TempSrc: Oral  Oral Oral  Resp: 18  18 18   Height:  5\' 5"  (1.651 m)    Weight:  159 lb 14.4 oz (72.53 kg)    SpO2: 100%  100% 100%    Intake/Output from previous day: No intake or output data in the 24 hours ending 09/22/12 0714  Physical Exam: Affect appropriate Healthy:  appears stated age HEENT: normal Neck supple with no adenopathy JVP normal no bruits no thyromegaly Lungs clear with no wheezing and good diaphragmatic motion Heart:  S1/S2 no murmur, no rub, gallop or click PMI normal Abdomen: benighn, BS positve, no tenderness, no AAA no bruit.  No HSM or HJR Distal pulses intact with no bruits No edema Neuro non-focal Skin warm and dry No muscular weakness   Lab Results: Basic Metabolic Panel:  Recent Labs  40/98/11 0943 09/22/12 0438  NA 141 143  K 4.6 3.7  CL 105 108  CO2 24 26  GLUCOSE 101* 100*  BUN 6 6  CREATININE 0.86 0.84  CALCIUM 10.2 9.3   CBC:  Recent Labs  09/21/12 0943  WBC 6.7  NEUTROABS 4.0  HGB 12.6  HCT 36.5  MCV 86.9  PLT 275   Cardiac Enzymes:  Recent Labs  09/21/12 1019 09/21/12 1557 09/21/12 2215  TROPONINI <0.30 <0.30 <0.30    Imaging: Dg Chest 2 View  09/21/2012   *RADIOLOGY REPORT*  Clinical Data: Sinus pain and congestion.  CHEST - 2 VIEW  Comparison: 08/26/2012 and CT chest 12/04/2004.  Findings: Trachea is midline.  Heart size stable.  Lungs are somewhat low in volume with mild diffuse interstitial prominence and indistinctness, which may be chronic, when compared with 12/04/2004.  No pleural fluid.  Degenerative changes are seen in the spine.  IMPRESSION: Mild diffuse interstitial  prominence and indistinctness are likely chronic.  No definite acute findings.   Original Report Authenticated By: Leanna Battles, M.D.    Cardiac Studies:  ECG:  NSR no acute ishcemic changes rate 71   Telemetry:  NSR no arrhythmia 09/22/2012   Echo:   Medications:   . amitriptyline  10 mg Oral QHS  . atorvastatin  40 mg Oral q1800  . clopidogrel  75 mg Oral Q breakfast  . enoxaparin (LOVENOX) injection  40 mg Subcutaneous Q24H  . insulin aspart  0-9 Units Subcutaneous TID WC  . ketotifen  1 drop Both Eyes BID  . lisinopril  40 mg Oral Daily  . loratadine  10 mg Oral Daily  . meclizine  25 mg Oral TID  . pantoprazole  40 mg Oral Daily  . sodium chloride  3 mL Intravenous Q12H  . vitamin C  500 mg Oral Daily       Assessment/Plan:  Chest Pain:  Atypical  lexiscan myovue today pantoprazole added for GERD since TUMS helped pain originally CVA:  Continue Plavix HTN:  On ACE with reasonable control Chol:  Continue statin LDL goal under 100  Charlton Haws 09/22/2012, 7:14 AM

## 2012-09-22 NOTE — Progress Notes (Signed)
Utilization review completed.  

## 2012-11-09 ENCOUNTER — Encounter: Payer: Self-pay | Admitting: Nurse Practitioner

## 2012-11-09 ENCOUNTER — Encounter (INDEPENDENT_AMBULATORY_CARE_PROVIDER_SITE_OTHER): Payer: Self-pay

## 2012-11-09 ENCOUNTER — Ambulatory Visit (INDEPENDENT_AMBULATORY_CARE_PROVIDER_SITE_OTHER): Payer: Medicare Other | Admitting: Nurse Practitioner

## 2012-11-09 VITALS — BP 158/91 | HR 90 | Temp 98.6°F | Ht 66.0 in | Wt 160.0 lb

## 2012-11-09 DIAGNOSIS — G464 Cerebellar stroke syndrome: Secondary | ICD-10-CM

## 2012-11-09 DIAGNOSIS — I6789 Other cerebrovascular disease: Secondary | ICD-10-CM

## 2012-11-09 DIAGNOSIS — E785 Hyperlipidemia, unspecified: Secondary | ICD-10-CM

## 2012-11-09 NOTE — Progress Notes (Signed)
[facility]  PATIENT: Donna Gilmore DOB: [birth year]   REASON FOR VISIT: follow up HISTORY FROM: patient  HISTORY OF PRESENT ILLNESS: Donna Gilmore is an 72 y.o. female with know HTN and hyperlipidemia. Patient is on ASA 81 mg daily and states she has not missed a dose. 2 weeks ago she noted she was "dizzy, weak and felt light headed. This continued for 2 weeks". On Monday (08/24/2012) her dizziness became suddenly worse, prompting her to go to the ED. Due to the long wait she left. She returned to the hospital yesterday due to ongoing dizziness. She denies vertiginous sensation or room spinning. She admits to feeling off balance but does not recall listing to one particular side. On admission her BP was 147/69 and most recent was 151/67. Patient was not a TPA candidate secondary to delay in arrival. She was admitted for further evaluation and treatment.   UPDATE 11/09/12 (LL):  Patient comes in for hospital stroke follow up.  She has been doing well, but has had continued intermittent dizziness, which she has experienced for the past 15 years or more.  She states she is worried that she will have another stroke and she is under a lot of stress right now.  Her 46 year old mother is living with her and is on Hospice care, and there is discontent between her and her siblings.  She has been tolerating Plavix without side effects and is taking all of her medications as prescribed.  She states her BP is normally in the 130s but is elevated in office today, 158/91.  REVIEW OF SYSTEMS: Full 14 system review of systems performed and notable only for:  Constitutional: N/A  Cardiovascular: N/A  Ear/Nose/Throat: hearing loss, ringing in ears  Skin: N/A  Eyes: N/A  Respiratory: N/A  Gastroitestinal: N/A  Genitourinary: N/A Hematology/Lymphatic: N/A  Endocrine: N/A Musculoskeletal:N/A  Allergy/Immunology: N/A  Neurological: dizziness Psychiatric: not enough sleep Sleep:  restless legs   ALLERGIES: No Known Allergies  HOME MEDICATIONS: Outpatient Prescriptions Prior to Visit  Medication Sig Dispense Refill  . amitriptyline (ELAVIL) 10 MG tablet Take 10 mg by mouth at bedtime.      Marland Kitchen atorvastatin (LIPITOR) 40 MG tablet Take 1 tablet (40 mg total) by mouth daily.  30 tablet  0  . cetirizine (ZYRTEC) 10 MG tablet Take 10 mg by mouth daily.      Marland Kitchen lisinopril (PRINIVIL,ZESTRIL) 40 MG tablet Take 1 tablet (40 mg total) by mouth daily.  30 tablet  0  . meclizine (ANTIVERT) 25 MG tablet Take 25 mg by mouth 3 (three) times daily.       . vitamin C (ASCORBIC ACID) 500 MG tablet Take 500 mg by mouth daily.      . Alcaftadine (LASTACAFT) 0.25 % SOLN Apply 1 drop to eye every evening. One drop in each eye      . clopidogrel (PLAVIX) 75 MG tablet Take 1 tablet (75 mg total) by mouth daily with breakfast.  30 tablet  0   No facility-administered medications prior to visit.    PAST MEDICAL HISTORY: Past Medical History  Diagnosis Date  . Hypertension   . Dyslipidemia 08/25/2012  . Essential hypertension, benign 08/25/2012  . CVA (cerebral infarction) 08/25/2012  . Benign paroxysmal positional vertigo 08/26/2012    PAST SURGICAL HISTORY: History reviewed. No pertinent past surgical history.  FAMILY HISTORY: No family history on file.  SOCIAL HISTORY: History   Social History  . Marital Status: Divorced  Spouse Name: N/A    Number of Children: N/A  . Years of Education: N/A   Occupational History  . Not on file.   Social History Main Topics  . Smoking status: Current Every Day Smoker  . Smokeless tobacco: Not on file  . Alcohol Use: No  . Drug Use: No  . Sexual Activity: Yes   Other Topics Concern  . Not on file   Social History Narrative  . No narrative on file   PHYSICAL EXAM  Filed Vitals:   11/09/12 1415  BP: 158/91  Pulse: 90  Temp: 98.6 F (37 C)  TempSrc: Oral  Height: 5\' 6"  (1.676 m)  Weight: 160 lb (72.576 kg)   Body mass  index is 25.84 kg/(m^2).  Generalized: Well developed, in no acute distress  Head: normocephalic and atraumatic. Oropharynx benign  Neck: Supple, no carotid bruits  Cardiac: Regular rate rhythm, no murmur  Musculoskeletal: No deformity   Neurological examination   Mentation: Alert oriented to time, place, history taking. Follows all commands speech and language fluent Cranial nerve II-XII: Pupils were equal round reactive to light extraocular movements were full, visual field were full on confrontational test. Facial sensation and strength were normal. hearing wasdecreased bilaterally, with bilateral hearing aids. Uvula tongue midline. head turning and shoulder shrug and were normal and symmetric. Tongue protrusion into cheek strength was normal. Motor: normal bulk and tone, full strength in the BUE, BLE, fine finger movements normal, no pronator drift. No focal weakness Sensory: normal and symmetric to light touch, pinprick, and  vibration  Coordination: finger-nose-finger, heel-to-shin bilaterally, no dysmetria Reflexes: Brachioradialis 2/2, biceps 2/2, triceps 2/2, patellar 2/2, Achilles 2/2, plantar responses were flexor bilaterally. Gait and Station: Rising up from seated position without assistance, normal stance, without trunk ataxia, moderate stride, good arm swing, smooth turning, able to perform tiptoe, and heel walking without difficulty. Tandem unsteady. Romberg negative.  DIAGNOSTIC DATA (LABS, IMAGING, TESTING) - I reviewed patient records, labs, notes, testing and imaging myself where available.  Lab Results  Component Value Date   WBC 6.7 09/21/2012   HGB 12.6 09/21/2012   HCT 36.5 09/21/2012   MCV 86.9 09/21/2012   PLT 275 09/21/2012      Component Value Date/Time   NA 143 09/22/2012 0438   K 3.7 09/22/2012 0438   CL 108 09/22/2012 0438   CO2 26 09/22/2012 0438   GLUCOSE 100* 09/22/2012 0438   BUN 6 09/22/2012 0438   CREATININE 0.84 09/22/2012 0438   CALCIUM 9.3 09/22/2012  0438   PROT 6.7 08/26/2012 1120   ALBUMIN 3.3* 08/26/2012 1120   AST 20 08/26/2012 1120   ALT 12 08/26/2012 1120   ALKPHOS 104 08/26/2012 1120   BILITOT 0.3 08/26/2012 1120   GFRNONAA 68* 09/22/2012 0438   GFRAA 79* 09/22/2012 0438   Lab Results  Component Value Date   CHOL 271* 08/26/2012   HDL 31* 08/26/2012   LDLCALC 180* 08/26/2012   TRIG 301* 08/26/2012   CHOLHDL 8.7 08/26/2012   Lab Results  Component Value Date   HGBA1C 6.0* 08/26/2012   No results found for this basename: VITAMINB12   Lab Results  Component Value Date   TSH 3.501 08/26/2012   MRI of the brain 08/25/2012 Small areas of acute and chronic infarct in the cerebellum bilaterally.  MRA of the brain 08/27/2012 Severe vertebral basilar disease without significant flow signal identified in the proximal basilar artery. High-grade distal right vertebral artery stenosis. 3. Occlusion of the nondominant left  vertebral artery. Moderate small vessel disease within the anterior circulation.  MRA of the neck 08/27/2012 High-grade stenosis of the right vertebral artery origin, the dominant vessel.  High-grade stenosis of the distal right vertebral artery, just proximal to the PICA. Occlusion of the left vertebral artery with faint reconstitution likely by musculoskeletal branches. Segmental stenoses of the residual left vertebral artery without to signal beyond the PICA. This may represent a congenitally small vessel which terminates at the PICA versus a distal occlusion.  Tortuous common carotid arteries and internal carotid arteries without significant stenoses in the anterior circulation. Fetal type posterior cerebral arteries bilaterally. Moderate to severe stenoses within the proximal hypoplastic basilar artery.  2D Echocardiogram EF 60-65% with no source of embolus.  Carotid Doppler See MRA neck   ASSESSMENT AND PLAN Ms. FIDELA CIESLAK is a 72 y.o. female presenting with ongoing dizziness x 2 weeks, on 08/28/12. MRI imaging confirms  acute bilateral cerebellar infarcts. 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 On aspirin 81 mg orally every day prior to admission. Now on clopidogrel 75 mg orally every day for secondary stroke prevention. Patient with resultant dizziness and ataxia.   Continue clopidogrel 75 mg orally every day  for secondary stroke prevention and maintain strict control of hypertension with blood pressure goal below 130/90, diabetes with hemoglobin A1c goal below 6.5% and lipids with LDL cholesterol goal below 100 mg/dL.   It is important for you to keep well hydrated, drinking 6-8 glasses of water a day.  Dehydration puts you at risk for stroke. We will order repeat lipid panel and TCDs. Followup in the future with me in 3 months.  Orders Placed This Encounter  Procedures  . [place] TCD COMPLETE  . Lipid panel   Tawny Asal Jaciel Diem, MSN, NP-C 11/09/2012, 4:40 PM [facility] [address] [place], [place] [address] [phone]

## 2012-11-09 NOTE — Patient Instructions (Addendum)
Continue clopidogrel 75 mg orally every day  for secondary stroke prevention and maintain strict control of hypertension with blood pressure goal below 130/90, diabetes with hemoglobin A1c goal below 6.5% and lipids with LDL cholesterol goal below 100 mg/d  It is important for you to keep well hydrated, drinking 6-8 glasses of water a day.  Dehydration puts you at risk for stroke.  Recheck cholesterol labs.  Followup in the future with me in 3 months.

## 2012-11-24 ENCOUNTER — Telehealth: Payer: Self-pay | Admitting: Nurse Practitioner

## 2012-11-24 ENCOUNTER — Ambulatory Visit (INDEPENDENT_AMBULATORY_CARE_PROVIDER_SITE_OTHER): Payer: Medicare Other

## 2012-11-24 DIAGNOSIS — E785 Hyperlipidemia, unspecified: Secondary | ICD-10-CM

## 2012-11-24 DIAGNOSIS — G464 Cerebellar stroke syndrome: Secondary | ICD-10-CM

## 2012-11-24 DIAGNOSIS — I6350 Cerebral infarction due to unspecified occlusion or stenosis of unspecified cerebral artery: Secondary | ICD-10-CM

## 2012-11-25 NOTE — Telephone Encounter (Signed)
I spoke to Donna Gilmore this morning and advised her that Dr. Pearlean Brownie recommends all stroke patients wait at least 6 months after their stroke to have elective procedures done.  This is due to the increased risk of stroke and the need to stop the Plavix for several days before the procedure. Her stroke was approximately 08/28/12, so I recommended she wait until after Feb 28, 2013 to have the colonoscopy done.  She acknowledged the information and had no further questions.

## 2013-02-05 ENCOUNTER — Encounter: Payer: Self-pay | Admitting: Nurse Practitioner

## 2013-02-10 ENCOUNTER — Other Ambulatory Visit: Payer: Self-pay | Admitting: Nurse Practitioner

## 2013-02-10 ENCOUNTER — Telehealth: Payer: Self-pay | Admitting: Nurse Practitioner

## 2013-02-10 DIAGNOSIS — E785 Hyperlipidemia, unspecified: Secondary | ICD-10-CM

## 2013-02-10 NOTE — Telephone Encounter (Signed)
Pt called asking about a letter she received to have a lipid test. There are no orders in please call and advise pt on what her next steps are.

## 2013-02-10 NOTE — Telephone Encounter (Signed)
Spoke with patient and shared Ms Lam's note, patient said that her pcp said that she had a lipid panel done 3 mos.ago, so we will disregard our order.

## 2013-02-10 NOTE — Telephone Encounter (Signed)
I put an order in for a lipid panel.  If she has had one in the last 6 months, she may disregard. She may have this done any day in our office, should be fasting, or at her PCP. Please notify.

## 2013-02-10 NOTE — Telephone Encounter (Signed)
No order for lipid test in epic, please advise

## 2013-02-16 ENCOUNTER — Ambulatory Visit (INDEPENDENT_AMBULATORY_CARE_PROVIDER_SITE_OTHER): Payer: Medicare Other | Admitting: Nurse Practitioner

## 2013-02-16 ENCOUNTER — Encounter: Payer: Self-pay | Admitting: Nurse Practitioner

## 2013-02-16 ENCOUNTER — Encounter (INDEPENDENT_AMBULATORY_CARE_PROVIDER_SITE_OTHER): Payer: Self-pay

## 2013-02-16 VITALS — BP 154/81 | HR 62 | Ht 66.0 in | Wt 161.0 lb

## 2013-02-16 DIAGNOSIS — I6789 Other cerebrovascular disease: Secondary | ICD-10-CM

## 2013-02-16 DIAGNOSIS — F17200 Nicotine dependence, unspecified, uncomplicated: Secondary | ICD-10-CM

## 2013-02-16 DIAGNOSIS — G464 Cerebellar stroke syndrome: Secondary | ICD-10-CM

## 2013-02-16 NOTE — Patient Instructions (Signed)
Continue clopidogrel 75 mg orally every day for secondary stroke prevention and maintain strict control of hypertension with blood pressure goal below 130/90, diabetes with hemoglobin A1c goal below 6.5% and lipids with LDL cholesterol goal below 100 mg/dL.  It is important for you to keep well hydrated, drinking 6-8 glasses of water a day. Dehydration puts you at risk for stroke.   Followup in the future in 6 months.

## 2013-02-16 NOTE — Progress Notes (Signed)
PATIENT: Donna Gilmore DOB: 1940/07/09   REASON FOR VISIT: stroke follow up HISTORY FROM: patient  HISTORY OF PRESENT ILLNESS: Donna Gilmore is an 73 y.o. female with know HTN and hyperlipidemia. Patient is on ASA 81 mg daily and states she has not missed a dose. 2 weeks ago she noted she was "dizzy, weak and felt light headed. This continued for 2 weeks". On Monday (08/24/2012) her dizziness became suddenly worse, prompting her to go to the ED. Due to the long wait she left. She returned to the hospital yesterday due to ongoing dizziness. She denies vertiginous sensation or room spinning. She admits to feeling off balance but does not recall listing to one particular side. On admission her BP was 147/69 and most recent was 151/67. Patient was not a TPA candidate secondary to delay in arrival. She was admitted for further evaluation and treatment.   UPDATE 11/09/12 (LL): Patient comes in for hospital stroke follow up. She has been doing well, but has had continued intermittent dizziness, which she has experienced for the past 15 years or more. She states she is worried that she will have another stroke and she is under a lot of stress right now. Her 70 year old mother is living with her and is on Hospice care, and there is discontent between her and her siblings. She has been tolerating Plavix without side effects and is taking all of her medications as prescribed. She states her BP is normally in the 130s but is elevated in office today, 158/91.   UPDATE 02/16/13 (LL):  Donna Gilmore returns to the office for stroke revisit.  Her dizziness is much better, she takes Meclizine twice daily.  She continues to be under a lot of stress with her mothers illness and her siblings. She has been tolerating Plavix without side effects and is taking all of her medications as prescribed. She does not monitor her BP at home. It is elevated in office today, 154/81.   REVIEW OF SYSTEMS: Full 14 system review  of systems performed and notable only for:  Ear/Nose/Throat: hearing loss, ringing in ears   ALLERGIES: No Known Allergies  HOME MEDICATIONS: Outpatient Prescriptions Prior to Visit  Medication Sig Dispense Refill  . amitriptyline (ELAVIL) 10 MG tablet Take 10 mg by mouth at bedtime.      Marland Kitchen atorvastatin (LIPITOR) 40 MG tablet Take 1 tablet (40 mg total) by mouth daily.  30 tablet  0  . clopidogrel (PLAVIX) 75 MG tablet Take 1 tablet (75 mg total) by mouth daily with breakfast.  30 tablet  0  . lisinopril (PRINIVIL,ZESTRIL) 40 MG tablet Take 1 tablet (40 mg total) by mouth daily.  30 tablet  0  . meclizine (ANTIVERT) 25 MG tablet Take 25 mg by mouth 3 (three) times daily.       . vitamin C (ASCORBIC ACID) 500 MG tablet Take 500 mg by mouth daily.      . cetirizine (ZYRTEC) 10 MG tablet Take 10 mg by mouth daily.       No facility-administered medications prior to visit.    PAST MEDICAL HISTORY: Past Medical History  Diagnosis Date  . Hypertension   . Dyslipidemia 08/25/2012  . Essential hypertension, benign 08/25/2012  . CVA (cerebral infarction) 08/25/2012  . Benign paroxysmal positional vertigo 08/26/2012    PAST SURGICAL HISTORY: No past surgical history on file.  FAMILY HISTORY: No family history on file.  SOCIAL HISTORY: History   Social History  .  Marital Status: Divorced    Spouse Name: N/A    Number of Children: 66  . Years of Education: N/A   Occupational History  . retired    Social History Main Topics  . Smoking status: Current Every Day Smoker  . Smokeless tobacco: Not on file  . Alcohol Use: No  . Drug Use: No  . Sexual Activity: Yes   Other Topics Concern  . Not on file   Social History Narrative  . No narrative on file     PHYSICAL EXAM  Filed Vitals:   02/16/13 1507  BP: 154/81  Pulse: 62  Height: 5\' 6"  (1.676 m)  Weight: 161 lb (73.029 kg)   Body mass index is 26 kg/(m^2).  Generalized: Well developed, in no acute distress  Head:  normocephalic and atraumatic. Oropharynx benign  Neck: Supple, no carotid bruits  Cardiac: Regular rate rhythm, no murmur  Musculoskeletal: No deformity   Neurological examination  Mentation: Alert oriented to time, place, history taking. Follows all commands speech and language fluent  Cranial nerve II-XII: Pupils were equal round reactive to light extraocular movements were full, visual field were full on confrontational test. Facial sensation and strength were normal. Hearing is decreased bilaterally, with bilateral hearing aids. Uvula tongue midline. head turning and shoulder shrug and were normal and symmetric. Tongue protrusion into cheek strength was normal.  Motor: normal bulk and tone, full strength in the BUE, BLE, fine finger movements normal, no pronator drift. No focal weakness  Sensory: normal and symmetric to light touch Coordination: finger-nose-finger, heel-to-shin bilaterally, no dysmetria  Reflexes: Symmetric Gait and Station: Rising up from seated position without assistance, normal stance, without trunk ataxia, moderate stride, good arm swing, smooth turning, Tandem unsteady. Romberg negative.  DIAGNOSTIC DATA (LABS, IMAGING, TESTING) - I reviewed patient records, labs, notes, testing and imaging myself where available.  Lab Results  Component Value Date   WBC 6.7 09/21/2012   HGB 12.6 09/21/2012   HCT 36.5 09/21/2012   MCV 86.9 09/21/2012   PLT 275 09/21/2012      Component Value Date/Time   NA 143 09/22/2012 0438   K 3.7 09/22/2012 0438   CL 108 09/22/2012 0438   CO2 26 09/22/2012 0438   GLUCOSE 100* 09/22/2012 0438   BUN 6 09/22/2012 0438   CREATININE 0.84 09/22/2012 0438   CALCIUM 9.3 09/22/2012 0438   PROT 6.7 08/26/2012 1120   ALBUMIN 3.3* 08/26/2012 1120   AST 20 08/26/2012 1120   ALT 12 08/26/2012 1120   ALKPHOS 104 08/26/2012 1120   BILITOT 0.3 08/26/2012 1120   GFRNONAA 68* 09/22/2012 0438   GFRAA 79* 09/22/2012 0438   Lab Results  Component Value Date   CHOL  271* 08/26/2012   HDL 31* 08/26/2012   LDLCALC 180* 08/26/2012   TRIG 301* 08/26/2012   CHOLHDL 8.7 08/26/2012   Lab Results  Component Value Date   HGBA1C 6.0* 08/26/2012   No results found for this basename: VITAMINB12   Lab Results  Component Value Date   TSH 3.501 08/26/2012   No results found for this basename: ESRSEDRATE    ASSESSMENT AND PLAN MRI of the brain 08/25/2012 Small areas of acute and chronic infarct in the cerebellum bilaterally.  MRA of the brain 08/27/2012 Severe vertebral basilar disease without significant flow signal identified in the proximal basilar artery. High-grade distal right vertebral artery stenosis. 3. Occlusion of the nondominant left vertebral artery. Moderate small vessel disease within the anterior circulation.  MRA of  the neck 08/27/2012 High-grade stenosis of the right vertebral artery origin, the dominant vessel. High-grade stenosis of the distal right vertebral artery, just proximal to the PICA. Occlusion of the left vertebral artery with faint reconstitution likely by musculoskeletal branches. Segmental stenoses of the residual left vertebral artery without to signal beyond the PICA. This may represent a congenitally small vessel which terminates at the PICA versus a distal occlusion. Tortuous common carotid arteries and internal carotid arteries without significant stenoses in the anterior circulation. Fetal type posterior cerebral arteries bilaterally. Moderate to severe stenoses within the proximal hypoplastic basilar artery.  2D Echocardiogram EF 60-65% with no source of embolus.   ASSESSMENT AND PLAN  Donna Gilmore is a 73 y.o. female presenting with ongoing dizziness x 2 weeks, on 08/28/12. MRI imaging confirms acute bilateral cerebellar infarcts. 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  Neurological deficits resolved.  She continues to smoke despite counseling.   Continue clopidogrel 75 mg orally every day for secondary stroke prevention and maintain strict control of hypertension with blood pressure goal below 130/90, diabetes with hemoglobin A1c goal below 6.5% and lipids with LDL cholesterol goal below 100 mg/dL.  It is important for you to keep well hydrated, drinking 6-8 glasses of water a day. Dehydration puts you at risk for stroke.   Followup in the future in 6 months.   Philmore Pali, MSN, NP-C 02/16/2013, 3:15 PM [facility] [address] [place], [place] [address] [phone]  Note: This document was prepared with digital dictation and possible smart phrase technology. Any transcriptional errors that result from this process are unintentional.

## 2013-07-06 ENCOUNTER — Encounter: Payer: Self-pay | Admitting: Neurology

## 2013-07-06 ENCOUNTER — Telehealth: Payer: Self-pay | Admitting: Neurology

## 2013-07-06 NOTE — Telephone Encounter (Signed)
Spoke to patient regarding rescheduling 08/18/13 appointment per Dr. Clydene Fake schedule, moved to Dr. Erlinda Hong, printed and mailed letter with new appointment time.

## 2013-08-12 ENCOUNTER — Encounter: Payer: Self-pay | Admitting: Neurology

## 2013-08-12 ENCOUNTER — Ambulatory Visit (INDEPENDENT_AMBULATORY_CARE_PROVIDER_SITE_OTHER): Payer: Medicare Other | Admitting: Neurology

## 2013-08-12 ENCOUNTER — Encounter (INDEPENDENT_AMBULATORY_CARE_PROVIDER_SITE_OTHER): Payer: Self-pay

## 2013-08-12 VITALS — BP 131/83 | HR 97 | Wt 162.8 lb

## 2013-08-12 DIAGNOSIS — F17200 Nicotine dependence, unspecified, uncomplicated: Secondary | ICD-10-CM

## 2013-08-12 DIAGNOSIS — E785 Hyperlipidemia, unspecified: Secondary | ICD-10-CM

## 2013-08-12 DIAGNOSIS — G464 Cerebellar stroke syndrome: Secondary | ICD-10-CM

## 2013-08-12 DIAGNOSIS — I6789 Other cerebrovascular disease: Secondary | ICD-10-CM

## 2013-08-12 NOTE — Patient Instructions (Signed)
1. Quit smoking 2. Blood tests for stroke work up today 3 cartoid doppler to look at neck vessels 4. Control BP not too high and not too low. 5. Continue plavix and lipitor 6. Follow up in 2 months.

## 2013-08-12 NOTE — Progress Notes (Signed)
STROKE NEUROLOGY FOLLOW UP NOTE  NAME: Donna Gilmore DOB: 05-30-1940  REASON FOR VISIT:  Stroke follow up HISTORY FROM: pt and chart  Today we had the pleasure of seeing Donna Gilmore in follow-up at our Neurology Clinic. Pt was accompanied by no one.   History Summary Donna Gilmore is an 73 y.o. female with know HTN and hyperlipidemia was admitted to [facility] in 07/2012 due to "dizzy, weak and felt light headed. This continued for 2 weeks". She denies vertiginous sensation or room spinning. On admission her BP was 147/69 and most recent was 151/67. She was found to have bilaterally cerebellar small infarct but also high grade stenosis of right VA and BA, occluded left VA. She was put on plavix and high dose lipitor.    11/09/12 (LL): Patient comes in for hospital stroke follow up. She has been doing well, but has had continued intermittent lightheadedness, which she has experienced for the past 15 years or more. She states she is worried that she will have another stroke and she is under a lot of stress right now. Her 3 year old mother is living with her and is on Hospice care, and there is discontent between her and her siblings. She has been tolerating Plavix without side effects and is taking all of her medications as prescribed. She states her BP is normally in the 130s but is elevated in office today, 158/91.   02/16/13 (LL): Donna Gilmore returns to the office for stroke revisit. Her lightheadedness is much better, she takes Meclizine twice daily. She continues to be under a lot of stress with her mothers illness and her siblings. She has been tolerating Plavix without side effects and is taking all of her medications as prescribed. She does not monitor her BP at home. It is elevated in office today, 154/81.   Interval History  During the interval time, the patient has been doing the same. She still stated that she has intermittent lightheadedness but not vertigo. The lightheadedness  can be associated with neck movement but also can happen even she is still. This is no change as before. She is doing better stress wise as her mom passed away. She continues to smoke and she said she tried nicotine patch before but does not work and she does not want try else anymore.    REVIEW OF SYSTEMS: Full 14 system review of systems performed and notable only for: fatigue, hearing loss, ringing in the ears, insomnia, frequent urination, moles.  The following represents the patient's updated allergies and side effects list: No Known Allergies  Labs since last visit of relevance include the following: Results for orders placed during the hospital encounter of 09/21/12  CBC WITH DIFFERENTIAL      Result Value Ref Range   WBC 6.7  4.0 - 10.5 K/uL   RBC 4.20  3.87 - 5.11 MIL/uL   Hemoglobin 12.6  12.0 - 15.0 g/dL   HCT 36.5  36.0 - 46.0 %   MCV 86.9  78.0 - 100.0 fL   MCH 30.0  26.0 - 34.0 pg   MCHC 34.5  30.0 - 36.0 g/dL   RDW 14.1  11.5 - 15.5 %   Platelets 275  150 - 400 K/uL   Neutrophils Relative % 60  43 - 77 %   Neutro Abs 4.0  1.7 - 7.7 K/uL   Lymphocytes Relative 29  12 - 46 %   Lymphs Abs 1.9  0.7 - 4.0 K/uL  Monocytes Relative 6  3 - 12 %   Monocytes Absolute 0.4  0.1 - 1.0 K/uL   Eosinophils Relative 5  0 - 5 %   Eosinophils Absolute 0.3  0.0 - 0.7 K/uL   Basophils Relative 1  0 - 1 %   Basophils Absolute 0.0  0.0 - 0.1 K/uL  BASIC METABOLIC PANEL      Result Value Ref Range   Sodium 141  135 - 145 mEq/L   Potassium 4.6  3.5 - 5.1 mEq/L   Chloride 105  96 - 112 mEq/L   CO2 24  19 - 32 mEq/L   Glucose, Bld 101 (*) 70 - 99 mg/dL   BUN 6  6 - 23 mg/dL   Creatinine, Ser 0.86  0.50 - 1.10 mg/dL   Calcium 10.2  8.4 - 10.5 mg/dL   GFR calc non Af Amer 66 (*) >90 mL/min   GFR calc Af Amer 76 (*) >90 mL/min  TROPONIN I      Result Value Ref Range   Troponin I <0.30  <0.30 ng/mL  TROPONIN I      Result Value Ref Range   Troponin I <0.30  <0.30 ng/mL  TROPONIN I       Result Value Ref Range   Troponin I <0.30  <0.30 ng/mL  BASIC METABOLIC PANEL      Result Value Ref Range   Sodium 143  135 - 145 mEq/L   Potassium 3.7  3.5 - 5.1 mEq/L   Chloride 108  96 - 112 mEq/L   CO2 26  19 - 32 mEq/L   Glucose, Bld 100 (*) 70 - 99 mg/dL   BUN 6  6 - 23 mg/dL   Creatinine, Ser 0.84  0.50 - 1.10 mg/dL   Calcium 9.3  8.4 - 10.5 mg/dL   GFR calc non Af Amer 68 (*) >90 mL/min   GFR calc Af Amer 79 (*) >90 mL/min  GLUCOSE, CAPILLARY      Result Value Ref Range   Glucose-Capillary 85  70 - 99 mg/dL  POCT I-STAT TROPONIN I      Result Value Ref Range   Troponin i, poc 0.00  0.00 - 0.08 ng/mL   Comment 3             The neurologically relevant items on the patient's problem list were reviewed on today's visit.  Neurologic Examination  A problem focused neurological exam (12 or more points of the single system neurologic examination, vital signs counts as 1 point, cranial nerves count for 8 points) was performed.  Blood pressure 131/83, pulse 97, weight 162 lb 12.8 oz (73.846 kg).  General - Well nourished, well developed, in no apparent distress.  Ophthalmologic - Sharp disc margins OU.  Cardiovascular - Regular rate and rhythm with no murmur.  Mental Status -  Level of arousal and orientation to time, place, and person were intact. Language including expression, naming, repetition, comprehension was assessed and found intact.  Cranial Nerves II - XII - II - Vision intact OU. III, IV, VI - Extraocular movements intact. V - Facial sensation intact bilaterally. VII - Facial movement intact bilaterally. VIII - Hearing & vestibular intact bilaterally except hard of hearing. X - Palate elevates symmetrically. XI - Chin turning & shoulder shrug intact bilaterally. XII - Tongue protrusion intact.  Motor Strength - The patient's strength was normal in all extremities and pronator drift was absent.  Bulk was normal and fasciculations were absent.  Motor  Tone - Muscle tone was assessed at the neck and appendages and was normal.  Reflexes - The patient's reflexes were normal in all extremities and she had no pathological reflexes.  Sensory - Light touch, temperature/pinprick, vibration and proprioception, and Romberg testing were assessed and were normal.    Coordination - The patient had normal movements in the hands and feet with no ataxia or dysmetria.  Tremor was absent.  Gait and Station - The patient's transfers, posture, gait, station, and turns were observed as normal.  Data reviewed: I personally reviewed the images and agree with the radiology interpretations.  MRA head 08/27/12 1.  Severe vertebral basilar disease without significant flow signal identified in the proximal basilar artery. 2.  High-grade distal right vertebral artery stenosis. 3.  Occlusion of the nondominant left vertebral artery. 4.  Moderate small vessel disease within the anterior circulation. MRA neck 1.  High-grade stenosis of the right vertebral artery origin, the dominant vessel. 2.  High-grade stenosis of the distal right vertebral artery, just proximal to the PICA. 3.  Occlusion of the left vertebral artery with faint reconstitution likely by musculoskeletal branches. 4.  Segmental stenoses of the residual left vertebral artery without to signal beyond the PICA.  This may represent a congenitally small vessel which terminates at the PICA versus a distal occlusion. 5.  Tortuous common carotid arteries and internal carotid arteries without significant stenoses in the anterior circulation. 6.  Fetal type posterior cerebral arteries bilaterally. 7. Moderate to severe stenoses within the proximal hypoplastic basilar artery. MRI brain -  Small areas of acute and chronic infarct in the cerebellum bilaterally. 2D echo -  - Left ventricle: The cavity size was normal. There was mild concentric hypertrophy. Systolic function was normal. The   estimated ejection  fraction was in the range of 60% to   65%. Wall motion was normal; there were no regional wall   motion abnormalities. Doppler parameters are consistent   with abnormal left ventricular relaxation (grade 1   diastolic dysfunction). - Mitral valve: Mildly calcified annulus. Mildly calcified   leaflets . Mild regurgitation. TCD - 11/2012 Consistent with mild stenosis of right VA. Mildly diminished CBFV in the mid BA of unclear clinical and hemodynamical significance.  Component     Latest Ref Rng 08/26/2012  Cholesterol     0 - 200 mg/dL 271 (H)  Triglycerides     <150 mg/dL 301 (H)  HDL     >39 mg/dL 31 (L)  Total CHOL/HDL Ratio      8.7  VLDL     0 - 40 mg/dL 60 (H)  LDL (calc)     0 - 99 mg/dL 180 (H)  Hemoglobin A1C     <5.7 % 6.0 (H)  Mean Plasma Glucose     <117 mg/dL 126 (H)  TSH     0.350 - 4.500 uIU/mL 3.501    Assessment: As you may recall, she is a 73 y.o.  African American female with a diagnosis of bilaterally cerebellar stroke in 07/2012. Since then, she had no recurrent stroke but continued to have intermittent lightheadedness, on meclizine. Her posterior vessels are highly stenosesd or occluded. Her BP was 131/83, on the goal. We do not want BP drop too low to cause hypoperfusion in the posterior circulation. Will repeat carotid doppler to monitor the posterior vessels. Due for stroke labs today. She continue to smoke, which is a big risk factor for vessel occlusion. She promise to try to  quit this time but refuse to try chantix and also failed nicotine patch in the past.   Diagnoses from this visit: Cerebellar stroke syndrome - Plan: US Carotid Bilateral, TSH + free T4, Vitamin B12, Lipid panel, Hemoglobin A1c  Tobacco use disorder  Dyslipidemia - Plan: Lipid panel  Plan:  - smoking cessation counseling provided. - stroke labs today - will repeat carotid doppler to monitor posterior circulation - continue plavix and statin for stroke prevention - BP control,  goal 120-140, not too high or too low - follow up with PCP for stroke risk factor modification - RTC in 2 months.  @ORDERS @ Orders Placed This Encounter  Procedures  . US Carotid Bilateral  . TSH + free T4  . Vitamin B12  . Lipid panel  . Hemoglobin A1c   Patient Instructions  1. Quit smoking 2. Blood tests for stroke work up today 3 cartoid doppler to look at neck vessels 4. Control BP not too high and not too low. 5. Continue plavix and lipitor 6. Follow up in 2 months.   Donna Hawking, MD PhD [facility] [address] [place], [place] [address] [phone]

## 2013-08-14 LAB — LIPID PANEL
CHOLESTEROL TOTAL: 175 mg/dL (ref 100–199)
Chol/HDL Ratio: 4.3 ratio units (ref 0.0–4.4)
HDL: 41 mg/dL (ref >39)
LDL Calculated: 96 mg/dL (ref 0–99)
Triglycerides: 190 mg/dL — ABNORMAL HIGH (ref 0–149)
VLDL CHOLESTEROL CAL: 38 mg/dL (ref 5–40)

## 2013-08-14 LAB — TSH+FREE T4
FREE T4: 0.95 ng/dL (ref 0.82–1.77)
TSH: 1.99 u[IU]/mL (ref 0.450–4.500)

## 2013-08-14 LAB — HEMOGLOBIN A1C
ESTIMATED AVERAGE GLUCOSE: 146 mg/dL
HEMOGLOBIN A1C: 6.7 % — AB (ref 4.8–5.6)

## 2013-08-14 LAB — VITAMIN B12: VITAMIN B 12: 317 pg/mL (ref 211–946)

## 2013-08-18 ENCOUNTER — Ambulatory Visit: Payer: Medicare Other | Admitting: Neurology

## 2013-09-15 ENCOUNTER — Ambulatory Visit (INDEPENDENT_AMBULATORY_CARE_PROVIDER_SITE_OTHER): Payer: Medicare Other

## 2013-09-15 DIAGNOSIS — G464 Cerebellar stroke syndrome: Secondary | ICD-10-CM

## 2013-09-15 DIAGNOSIS — I6789 Other cerebrovascular disease: Secondary | ICD-10-CM

## 2013-09-30 ENCOUNTER — Emergency Department (HOSPITAL_COMMUNITY): Payer: Medicare Other

## 2013-09-30 ENCOUNTER — Emergency Department (HOSPITAL_COMMUNITY)
Admission: EM | Admit: 2013-09-30 | Discharge: 2013-09-30 | Disposition: A | Discharge status: Home / Self Care | Payer: Medicare Other | Attending: Emergency Medicine | Admitting: Emergency Medicine

## 2013-09-30 ENCOUNTER — Encounter (HOSPITAL_COMMUNITY): Payer: Self-pay | Admitting: Emergency Medicine

## 2013-09-30 DIAGNOSIS — Z79899 Other long term (current) drug therapy: Secondary | ICD-10-CM | POA: Insufficient documentation

## 2013-09-30 DIAGNOSIS — Z7902 Long term (current) use of antithrombotics/antiplatelets: Secondary | ICD-10-CM | POA: Insufficient documentation

## 2013-09-30 DIAGNOSIS — R42 Dizziness and giddiness: Secondary | ICD-10-CM | POA: Insufficient documentation

## 2013-09-30 DIAGNOSIS — K118 Other diseases of salivary glands: Secondary | ICD-10-CM | POA: Diagnosis not present

## 2013-09-30 DIAGNOSIS — I10 Essential (primary) hypertension: Secondary | ICD-10-CM | POA: Insufficient documentation

## 2013-09-30 DIAGNOSIS — Z8673 Personal history of transient ischemic attack (TIA), and cerebral infarction without residual deficits: Secondary | ICD-10-CM | POA: Diagnosis not present

## 2013-09-30 DIAGNOSIS — F17200 Nicotine dependence, unspecified, uncomplicated: Secondary | ICD-10-CM | POA: Diagnosis not present

## 2013-09-30 DIAGNOSIS — Z8639 Personal history of other endocrine, nutritional and metabolic disease: Secondary | ICD-10-CM | POA: Insufficient documentation

## 2013-09-30 DIAGNOSIS — H81399 Other peripheral vertigo, unspecified ear: Secondary | ICD-10-CM | POA: Insufficient documentation

## 2013-09-30 DIAGNOSIS — Z862 Personal history of diseases of the blood and blood-forming organs and certain disorders involving the immune mechanism: Secondary | ICD-10-CM | POA: Diagnosis not present

## 2013-09-30 LAB — COMPREHENSIVE METABOLIC PANEL
ALBUMIN: 3.5 g/dL (ref 3.5–5.2)
ALK PHOS: 114 U/L (ref 39–117)
ALT: 13 U/L (ref 0–35)
AST: 17 U/L (ref 0–37)
Anion gap: 15 (ref 5–15)
BUN: 8 mg/dL (ref 6–23)
CO2: 22 mEq/L (ref 19–32)
Calcium: 9.3 mg/dL (ref 8.4–10.5)
Chloride: 106 mEq/L (ref 96–112)
Creatinine, Ser: 0.96 mg/dL (ref 0.50–1.10)
GFR calc non Af Amer: 57 mL/min — ABNORMAL LOW (ref >90)
GFR, EST AFRICAN AMERICAN: 66 mL/min — AB (ref >90)
Glucose, Bld: 101 mg/dL — ABNORMAL HIGH (ref 70–99)
POTASSIUM: 4.3 meq/L (ref 3.7–5.3)
Sodium: 143 mEq/L (ref 137–147)
Total Bilirubin: 0.4 mg/dL (ref 0.3–1.2)
Total Protein: 7.1 g/dL (ref 6.0–8.3)

## 2013-09-30 LAB — I-STAT CHEM 8, ED
BUN: 6 mg/dL (ref 6–23)
CALCIUM ION: 1.19 mmol/L (ref 1.13–1.30)
CHLORIDE: 112 meq/L (ref 96–112)
CREATININE: 0.9 mg/dL (ref 0.50–1.10)
GLUCOSE: 105 mg/dL — AB (ref 70–99)
HCT: 36 % (ref 36.0–46.0)
Hemoglobin: 12.2 g/dL (ref 12.0–15.0)
Potassium: 4 mEq/L (ref 3.7–5.3)
Sodium: 139 mEq/L (ref 137–147)
TCO2: 28 mmol/L (ref 0–100)

## 2013-09-30 LAB — URINALYSIS, ROUTINE W REFLEX MICROSCOPIC
Bilirubin Urine: NEGATIVE
GLUCOSE, UA: NEGATIVE mg/dL
Hgb urine dipstick: NEGATIVE
KETONES UR: NEGATIVE mg/dL
NITRITE: NEGATIVE
PROTEIN: NEGATIVE mg/dL
Specific Gravity, Urine: 1.009 (ref 1.005–1.030)
Urobilinogen, UA: 0.2 mg/dL (ref 0.0–1.0)
pH: 7 (ref 5.0–8.0)

## 2013-09-30 LAB — URINE MICROSCOPIC-ADD ON

## 2013-09-30 LAB — RAPID URINE DRUG SCREEN, HOSP PERFORMED
AMPHETAMINES: NOT DETECTED
BARBITURATES: NOT DETECTED
Benzodiazepines: NOT DETECTED
Cocaine: NOT DETECTED
Opiates: NOT DETECTED
Tetrahydrocannabinol: NOT DETECTED

## 2013-09-30 LAB — I-STAT TROPONIN, ED: Troponin i, poc: 0 ng/mL (ref 0.00–0.08)

## 2013-09-30 LAB — DIFFERENTIAL
Basophils Absolute: 0 10*3/uL (ref 0.0–0.1)
Basophils Relative: 1 % (ref 0–1)
Eosinophils Absolute: 0.1 10*3/uL (ref 0.0–0.7)
Eosinophils Relative: 2 % (ref 0–5)
LYMPHS ABS: 2.2 10*3/uL (ref 0.7–4.0)
Lymphocytes Relative: 38 % (ref 12–46)
MONOS PCT: 9 % (ref 3–12)
Monocytes Absolute: 0.5 10*3/uL (ref 0.1–1.0)
Neutro Abs: 2.9 10*3/uL (ref 1.7–7.7)
Neutrophils Relative %: 50 % (ref 43–77)

## 2013-09-30 LAB — CBC
HCT: 36.4 % (ref 36.0–46.0)
HEMOGLOBIN: 11.9 g/dL — AB (ref 12.0–15.0)
MCH: 29 pg (ref 26.0–34.0)
MCHC: 32.7 g/dL (ref 30.0–36.0)
MCV: 88.8 fL (ref 78.0–100.0)
Platelets: 224 10*3/uL (ref 150–400)
RBC: 4.1 MIL/uL (ref 3.87–5.11)
RDW: 14.4 % (ref 11.5–15.5)
WBC: 5.7 10*3/uL (ref 4.0–10.5)

## 2013-09-30 LAB — PROTIME-INR
INR: 1.03 (ref 0.00–1.49)
Prothrombin Time: 13.5 seconds (ref 11.6–15.2)

## 2013-09-30 LAB — ETHANOL: Alcohol, Ethyl (B): <11 mg/dL (ref 0–11)

## 2013-09-30 LAB — APTT: aPTT: 30 seconds (ref 24–37)

## 2013-09-30 MED ORDER — MECLIZINE HCL 25 MG PO TABS: 25.0000 mg | ORAL_TABLET | Freq: Three times a day (TID) | ORAL | Status: DC | PRN

## 2013-09-30 MED ORDER — ONDANSETRON HCL 4 MG PO TABS: 4.0000 mg | ORAL_TABLET | Freq: Four times a day (QID) | ORAL | Status: DC

## 2013-09-30 NOTE — Discharge Instructions (Signed)
 Epley Maneuver Self-Care WHAT IS THE EPLEY MANEUVER? The Epley maneuver is an exercise you can do to relieve symptoms of benign paroxysmal positional vertigo (BPPV). This condition is often just referred to as vertigo. BPPV is caused by the movement of tiny crystals (canaliths) inside your inner ear. The accumulation and movement of canaliths in your inner ear causes a sudden spinning sensation (vertigo) when you move your head to certain positions. Vertigo usually lasts about 30 seconds. BPPV usually occurs in just one ear. If you get vertigo when you lie on your left side, you probably have BPPV in your left ear. Your health care provider can tell you which ear is involved.  BPPV may be caused by a head injury. Many people older than 50 get BPPV for unknown reasons. If you have been diagnosed with BPPV, your health care provider may teach you how to do this maneuver. BPPV is not life threatening (benign) and usually goes away in time.  WHEN SHOULD I PERFORM THE EPLEY MANEUVER? You can do this maneuver at home whenever you have symptoms of vertigo. You may do the Epley maneuver up to 3 times a day until your symptoms of vertigo go away. HOW SHOULD I DO THE EPLEY MANEUVER? 1. Sit on the edge of a bed or table with your back straight. Your legs should be extended or hanging over the edge of the bed or table.  2. Turn your head halfway toward the affected ear.  3. Lie backward quickly with your head turned until you are lying flat on your back. You may want to position a pillow under your shoulders.  4. Hold this position for 30 seconds. You may experience an attack of vertigo. This is normal. Hold this position until the vertigo stops. 5. Then turn your head to the opposite direction until your unaffected ear is facing the floor.  6. Hold this position for 30 seconds. You may experience an attack of vertigo. This is normal. Hold this position until the vertigo stops. 7. Now turn your whole body  to the same side as your head. Hold for another 30 seconds.  8. You can then sit back up. ARE THERE RISKS TO THIS MANEUVER? In some cases, you may have other symptoms (such as changes in your vision, weakness, or numbness). If you have these symptoms, stop doing the maneuver and call your health care provider. Even if doing these maneuvers relieves your vertigo, you may still have dizziness. Dizziness is the sensation of light-headedness but without the sensation of movement. Even though the Epley maneuver may relieve your vertigo, it is possible that your symptoms will return within 5 years. WHAT SHOULD I DO AFTER THIS MANEUVER? After doing the Epley maneuver, you can return to your normal activities. Ask your doctor if there is anything you should do at home to prevent vertigo. This may include:  Sleeping with two or more pillows to keep your head elevated.  Not sleeping on the side of your affected ear.  Getting up slowly from bed.  Avoiding sudden movements during the day.  Avoiding extreme head movement, like looking up or bending over.  Wearing a cervical collar to prevent sudden head movements. WHAT SHOULD I DO IF MY SYMPTOMS GET WORSE? Call your health care provider if your vertigo gets worse. Call your provider right way if you have other symptoms, including:   Nausea.  Vomiting.  Headache.  Weakness.  Numbness.  Vision changes. Document Released: 01/19/2013 Document Reviewed: 01/19/2013   ExitCare Patient Information 2015 [employer]. This information is not intended to replace advice given to you by your health care provider. Make sure you discuss any questions you have with your health care provider.  Benign Positional Vertigo Vertigo means you feel like you or your surroundings are moving when they are not. Benign positional vertigo is the most common form of vertigo. Benign means that the cause of your condition is not serious. Benign positional vertigo is more  common in older adults. CAUSES  Benign positional vertigo is the result of an upset in the labyrinth system. This is an area in the middle ear that helps control your balance. This may be caused by a viral infection, head injury, or repetitive motion. However, often no specific cause is found. SYMPTOMS  Symptoms of benign positional vertigo occur when you move your head or eyes in different directions. Some of the symptoms may include:  Loss of balance and falls.  Vomiting.  Blurred vision.  Dizziness.  Nausea.  Involuntary eye movements (nystagmus). DIAGNOSIS  Benign positional vertigo is usually diagnosed by physical exam. If the specific cause of your benign positional vertigo is unknown, your caregiver may perform imaging tests, such as magnetic resonance imaging (MRI) or computed tomography (CT). TREATMENT  Your caregiver may recommend movements or procedures to correct the benign positional vertigo. Medicines such as meclizine, benzodiazepines, and medicines for nausea may be used to treat your symptoms. In rare cases, if your symptoms are caused by certain conditions that affect the inner ear, you may need surgery. HOME CARE INSTRUCTIONS   Follow your caregiver's instructions.  Move slowly. Do not make sudden body or head movements.  Avoid driving.  Avoid operating heavy machinery.  Avoid performing any tasks that would be dangerous to you or others during a vertigo episode.  Drink enough fluids to keep your urine clear or pale yellow. SEEK IMMEDIATE MEDICAL CARE IF:   You develop problems with walking, weakness, numbness, or using your arms, hands, or legs.  You have difficulty speaking.  You develop severe headaches.  Your nausea or vomiting continues or gets worse.  You develop visual changes.  Your family or friends notice any behavioral changes.  Your condition gets worse.  You have a fever.  You develop a stiff neck or sensitivity to light. MAKE SURE  YOU:   Understand these instructions.  Will watch your condition.  Will get help right away if you are not doing well or get worse. Document Released: 10/22/2005 Document Revised: 04/08/2011 Document Reviewed: 10/04/2010 ExitCare Patient Information 2015 [employer]. This information is not intended to replace advice given to you by your health care provider. Make sure you discuss any questions you have with your health care provider.  

## 2013-09-30 NOTE — ED Provider Notes (Signed)
History    First MD Initiated Contact with Patient 09/30/13 [phone]       Chief Complaint   Patient presents with   .  Dizziness   .  Nausea      HPI Pt presents to the ED with complaints of dizziness.  She has had it off an on for twenty years.  She regularly takes meclizine.  Yesterday she developed another episode that was more severe.  She has had some nausea with it and the sense that she needs to have a BM.  These symptoms aren't typical.  No headache.  No trouble with speech or numbness or weakness.  She also feels like her balance is off which is different than usual.  Movement does make it worse.  She denies any recent injuries. She denies any trouble with coughing. She has not had any trouble with fevers. No blood in her stool.    Past Medical History   Diagnosis  Date   .  Hypertension     .  Stroke     .  Vertigo     .  Hypercholesteremia       Past medical history: Hypertension Dyslipidemia Essential hypertension Cerebral infarction Benign paroxysmal positional vertigo  Substance Use Topics   .  Smoking status:  Current Every Day Smoker   .  Smokeless tobacco:  Not on file   .  Alcohol Use:  Yes                           Review of Systems  All other systems reviewed and are negative.  Medications Amitriptyline, and Lipitor, Zyrtec, Plavix, Flonase, lisinopril, meclozine Allergies None  BP 155/72  Pulse 62  Temp(Src) 98.1 F (36.7 C) (Oral)  Resp 20  Ht 5\' 5"  (1.651 m)  Wt 161 lb (73.029 kg)  BMI 26.79 kg/m2  SpO2 100% Physical Exam  Nursing note and vitals reviewed. Constitutional: She is oriented to person, place, and time. She appears well-developed and well-nourished. No distress.  HENT:   Head: Normocephalic and atraumatic.  Right Ear: External ear normal.  Left Ear: External ear normal.   Mouth/Throat: Oropharynx is clear and moist.  Eyes: Conjunctivae are normal. Right eye exhibits no discharge. Left eye exhibits no discharge. No scleral  icterus.  Neck: Neck supple. No tracheal deviation present.  Cardiovascular: Normal rate, regular rhythm and intact distal pulses.   Pulmonary/Chest: Effort normal and breath sounds normal. No stridor. No respiratory distress. She has no wheezes. She has no rales.  Abdominal: Soft. Bowel sounds are normal. She exhibits no distension. There is no tenderness. There is no rebound and no guarding.  Musculoskeletal: She exhibits no edema and no tenderness.  Neurological: She is alert and oriented to person, place, and time. She has normal strength. No cranial nerve deficit (No facial droop, extraocular movements intact, tongue midline ) or sensory deficit. She exhibits normal muscle tone. She displays no seizure activity. Coordination and gait abnormal.  No pronator drift bilateral upper extrem, able to hold both legs off bed for 5 seconds, sensation intact in all extremities, no visual field cuts, no left or right sided neglect, normal finger-nose exam bilaterally, no nystagmus noted, unsteady, leans to left when she starts to walk  Skin: Skin is warm and dry. No rash noted.  Psychiatric: She has a normal mood and affect.       Labs Reviewed  CBC - Abnormal; Notable for the following:  Hemoglobin 11.9 (*)    All other components within normal limits  COMPREHENSIVE METABOLIC PANEL - Abnormal; Notable for the following:    Glucose, Bld 101 (*)    GFR calc non Af Amer 57 (*)    GFR calc Af Amer 66 (*)    All other components within normal limits  I-STAT CHEM 8, ED - Abnormal; Notable for the following:    Glucose, Bld 105 (*)    All other components within normal limits  ETHANOL  PROTIME-INR  APTT  DIFFERENTIAL  URINE RAPID DRUG SCREEN (HOSP PERFORMED)  URINALYSIS, ROUTINE W REFLEX MICROSCOPIC  I-STAT TROPOININ, ED  Randolm Idol, ED   Mr Brain Wo Contrast  09/30/2013   CLINICAL DATA:  73 year old female with extreme vertigo. Dizziness. Initial encounter.  EXAM: MRI HEAD WITHOUT  CONTRAST  TECHNIQUE: Multiplanar, multiecho pulse sequences of the brain and surrounding structures were obtained without intravenous contrast.  COMPARISON:  Brain MRI 08/25/2012.  Head and neck MRA a 08/27/2012.  FINDINGS: Stable cerebral volume. Major intracranial vascular flow voids are stable. No restricted diffusion or evidence of acute infarction.  Small chronic infarcts in the cerebellar hemispheres. No midline shift, mass effect, evidence of mass lesion, ventriculomegaly, extra-axial collection or acute intracranial hemorrhage. Cervicomedullary junction within normal limits. Bulky dural calcifications scattered bilaterally. Negative visualized cervical spine.  Stable gray and white matter signal.  Small chronic intra sellar pituitary lesion measuring 7-8 mm diameter, favor a small proteinaceous cyst, and unchanged. No associated suprasellar mass or mass effect. More homogeneous appearing pituitary parenchyma mildly shifted to the left. Non contrast appearance of the cavernous sinus is within normal limits.  Visible internal auditory structures appear normal. Mastoids are clear. Negative paranasal sinuses. Visualized orbit soft tissues are within normal limits. Normal bone marrow signal.  There is an oblong 2 x 10 mm nodule associated with the right parotid gland which has increased diffusion signal and decreased T2 signal. Otherwise negative scalp soft tissues.  IMPRESSION: 1. No acute intracranial abnormality; although this patient has had prior small cerebellar infarcts, no acute infarct is detected today. 2. Stable and probably inconsequential small intra sellar pituitary cyst. 3. Incidentally noted superficial right parotid gland 2 cm nodule, with MRI signal suggesting hypercellularity. Recommend nonemergent ENT follow-up.   Electronically Signed   By: Lars Pinks M.D.   On: 09/30/2013 14:03    Clinical impression: Peripheral vertigo, parotid nodule  The patient's MRI does not show any acute stroke. I  suspect her symptoms are related to benign paroxysmal positional vertigo. Discharge patient home with meclizine and Zofran for symptomatic relief. I discussed Epley maneuver. Patient was given an instruction on how to perform that at home.  Follow up with ENT regarding her vertigo as well as the incidental nodule noted on her parotid gland  Dorie Rank, MD 09/30/13 1506

## 2013-09-30 NOTE — ED Notes (Signed)
Pt transported to MRI 

## 2013-09-30 NOTE — ED Notes (Signed)
Pt remains in MRI 

## 2013-09-30 NOTE — ED Notes (Signed)
Pt returned from MRI °

## 2013-09-30 NOTE — ED Notes (Signed)
MD at bedside. 

## 2013-10-18 ENCOUNTER — Encounter (INDEPENDENT_AMBULATORY_CARE_PROVIDER_SITE_OTHER): Payer: Self-pay

## 2013-10-18 ENCOUNTER — Ambulatory Visit: Payer: Medicare Other | Admitting: Neurology

## 2013-10-18 ENCOUNTER — Ambulatory Visit (INDEPENDENT_AMBULATORY_CARE_PROVIDER_SITE_OTHER): Payer: Medicare Other | Admitting: Neurology

## 2013-10-22 ENCOUNTER — Ambulatory Visit (INDEPENDENT_AMBULATORY_CARE_PROVIDER_SITE_OTHER): Payer: Medicare Other | Admitting: Neurology

## 2013-10-22 ENCOUNTER — Encounter: Payer: Self-pay | Admitting: Neurology

## 2013-10-22 VITALS — BP 158/84 | HR 57 | Wt 160.2 lb

## 2013-10-22 DIAGNOSIS — E785 Hyperlipidemia, unspecified: Secondary | ICD-10-CM

## 2013-10-22 DIAGNOSIS — I10 Essential (primary) hypertension: Secondary | ICD-10-CM

## 2013-10-22 DIAGNOSIS — I6789 Other cerebrovascular disease: Secondary | ICD-10-CM

## 2013-10-22 DIAGNOSIS — G464 Cerebellar stroke syndrome: Secondary | ICD-10-CM

## 2013-10-22 DIAGNOSIS — F17200 Nicotine dependence, unspecified, uncomplicated: Secondary | ICD-10-CM

## 2013-10-22 NOTE — Patient Instructions (Addendum)
-   continue plavix and lipitor for stroke prevention - quit smoking - check BP and glucose at home daily - follow up with PCP next month for stroke risk factor modification and ENT referral for parotid gland nodule found on MRI.  - take B12 supplement since your B12 is at low normal range. - follow up as needed.

## 2013-10-23 DIAGNOSIS — G464 Cerebellar stroke syndrome: Secondary | ICD-10-CM | POA: Insufficient documentation

## 2013-10-23 NOTE — Progress Notes (Signed)
STROKE NEUROLOGY FOLLOW UP NOTE  NAME: Donna Gilmore DOB: 28-Aug-1940  REASON FOR VISIT:  Stroke follow up HISTORY FROM: pt and chart  Today we had the pleasure of seeing Donna Gilmore in follow-up at our Neurology Clinic. Pt was accompanied by no one.   History Summary Donna Gilmore is an 73 y.o. female with know HTN and hyperlipidemia was admitted to [facility] in 07/2012 due to "dizzy, weak and felt light headed. This continued for 2 weeks". She denies vertiginous sensation or room spinning. On admission her BP was 147/69 and most recent was 151/67. She was found to have bilaterally cerebellar small infarct but also high grade stenosis of right VA and BA, occluded left VA. She was put on plavix and high dose lipitor.    11/09/12 (LL): Patient comes in for hospital stroke follow up. She has been doing well, but has had continued intermittent lightheadedness, which she has experienced for the past 15 years or more. She states she is worried that she will have another stroke and she is under a lot of stress right now. Her 83 year old mother is living with her and is on Hospice care, and there is discontent between her and her siblings. She has been tolerating Plavix without side effects and is taking all of her medications as prescribed. She states her BP is normally in the 130s but is elevated in office today, 158/91.   02/16/13 (LL): Donna Gilmore returns to the office for stroke revisit. Her lightheadedness is much better, she takes Meclizine twice daily. She continues to be under a lot of stress with her mothers illness and her siblings. She has been tolerating Plavix without side effects and is taking all of her medications as prescribed. She does not monitor her BP at home. It is elevated in office today, 154/81.   08/12/13 follow up - Patient has been doing the same. She still stated that she has intermittent lightheadedness but not vertigo. The lightheadedness can be associated with neck  movement but also can happen even she is still. This is no change as before. She is doing better stress wise as her mom passed away. She continues to smoke and she said she tried nicotine patch before but does not work and she does not want try else anymore.  Interval History  During the interval time, she was doing the same. Had TCD and carotid doppler which were unremarkable. She again had ED visit for lightheadedness and discharged home.  REVIEW OF SYSTEMS: Full 14 system review of systems performed and notable only for: hearing loss and wearing hearing aids.  The following represents the patient's updated allergies and side effects list: No Known Allergies  Labs since last visit of relevance include the following: Results for orders placed during the hospital encounter of 09/30/13  ETHANOL      Result Value Ref Range   Alcohol, Ethyl (B) <11  0 - 11 mg/dL  PROTIME-INR      Result Value Ref Range   Prothrombin Time 13.5  11.6 - 15.2 seconds   INR 1.03  0.00 - 1.49  APTT      Result Value Ref Range   aPTT 30  24 - 37 seconds  CBC      Result Value Ref Range   WBC 5.7  4.0 - 10.5 K/uL   RBC 4.10  3.87 - 5.11 MIL/uL   Hemoglobin 11.9 (*) 12.0 - 15.0 g/dL   HCT 36.4  36.0 -  46.0 %   MCV 88.8  78.0 - 100.0 fL   MCH 29.0  26.0 - 34.0 pg   MCHC 32.7  30.0 - 36.0 g/dL   RDW 14.4  11.5 - 15.5 %   Platelets 224  150 - 400 K/uL  DIFFERENTIAL      Result Value Ref Range   Neutrophils Relative % 50  43 - 77 %   Neutro Abs 2.9  1.7 - 7.7 K/uL   Lymphocytes Relative 38  12 - 46 %   Lymphs Abs 2.2  0.7 - 4.0 K/uL   Monocytes Relative 9  3 - 12 %   Monocytes Absolute 0.5  0.1 - 1.0 K/uL   Eosinophils Relative 2  0 - 5 %   Eosinophils Absolute 0.1  0.0 - 0.7 K/uL   Basophils Relative 1  0 - 1 %   Basophils Absolute 0.0  0.0 - 0.1 K/uL  COMPREHENSIVE METABOLIC PANEL      Result Value Ref Range   Sodium 143  137 - 147 mEq/L   Potassium 4.3  3.7 - 5.3 mEq/L   Chloride 106  96 - 112  mEq/L   CO2 22  19 - 32 mEq/L   Glucose, Bld 101 (*) 70 - 99 mg/dL   BUN 8  6 - 23 mg/dL   Creatinine, Ser 0.96  0.50 - 1.10 mg/dL   Calcium 9.3  8.4 - 10.5 mg/dL   Total Protein 7.1  6.0 - 8.3 g/dL   Albumin 3.5  3.5 - 5.2 g/dL   AST 17  0 - 37 U/L   ALT 13  0 - 35 U/L   Alkaline Phosphatase 114  39 - 117 U/L   Total Bilirubin 0.4  0.3 - 1.2 mg/dL   GFR calc non Af Amer 57 (*) >90 mL/min   GFR calc Af Amer 66 (*) >90 mL/min   Anion gap 15  5 - 15  URINE RAPID DRUG SCREEN (HOSP PERFORMED)      Result Value Ref Range   Opiates NONE DETECTED  NONE DETECTED   Cocaine NONE DETECTED  NONE DETECTED   Benzodiazepines NONE DETECTED  NONE DETECTED   Amphetamines NONE DETECTED  NONE DETECTED   Tetrahydrocannabinol NONE DETECTED  NONE DETECTED   Barbiturates NONE DETECTED  NONE DETECTED  URINALYSIS, ROUTINE W REFLEX MICROSCOPIC      Result Value Ref Range   Color, Urine YELLOW  YELLOW   APPearance CLOUDY (*) CLEAR   Specific Gravity, Urine 1.009  1.005 - 1.030   pH 7.0  5.0 - 8.0   Glucose, UA NEGATIVE  NEGATIVE mg/dL   Hgb urine dipstick NEGATIVE  NEGATIVE   Bilirubin Urine NEGATIVE  NEGATIVE   Ketones, ur NEGATIVE  NEGATIVE mg/dL   Protein, ur NEGATIVE  NEGATIVE mg/dL   Urobilinogen, UA 0.2  0.0 - 1.0 mg/dL   Nitrite NEGATIVE  NEGATIVE   Leukocytes, UA TRACE (*) NEGATIVE  URINE MICROSCOPIC-ADD ON      Result Value Ref Range   Squamous Epithelial / LPF FEW (*) RARE   WBC, UA 0-2  <3 WBC/hpf   RBC / HPF 0-2  <3 RBC/hpf   Bacteria, UA FEW (*) RARE  I-STAT CHEM 8, ED      Result Value Ref Range   Sodium 139  137 - 147 mEq/L   Potassium 4.0  3.7 - 5.3 mEq/L   Chloride 112  96 - 112 mEq/L   BUN 6  6 -  23 mg/dL   Creatinine, Ser 0.90  0.50 - 1.10 mg/dL   Glucose, Bld 105 (*) 70 - 99 mg/dL   Calcium, Ion 1.19  1.13 - 1.30 mmol/L   TCO2 28  0 - 100 mmol/L   Hemoglobin 12.2  12.0 - 15.0 g/dL   HCT 36.0  36.0 - 46.0 %  I-STAT TROPOININ, ED      Result Value Ref Range   Troponin  i, poc 0.00  0.00 - 0.08 ng/mL   Comment 3             The neurologically relevant items on the patient's problem list were reviewed on today's visit.  Neurologic Examination  A problem focused neurological exam (12 or more points of the single system neurologic examination, vital signs counts as 1 point, cranial nerves count for 8 points) was performed.  Blood pressure 158/84, pulse 57, weight 160 lb 3.2 oz (72.666 kg).  General - Well nourished, well developed, in no apparent distress.  Ophthalmologic - Sharp disc margins OU.  Cardiovascular - Regular rate and rhythm with no murmur.  Mental Status -  Level of arousal and orientation to time, place, and person were intact. Language including expression, naming, repetition, comprehension was assessed and found intact.  Cranial Nerves II - XII - II - Vision intact OU. III, IV, VI - Extraocular movements intact. V - Facial sensation intact bilaterally. VII - Facial movement intact bilaterally. VIII - Hearing & vestibular intact bilaterally except hard of hearing. X - Palate elevates symmetrically. XI - Chin turning & shoulder shrug intact bilaterally. XII - Tongue protrusion intact.  Motor Strength - The patient's strength was normal in all extremities and pronator drift was absent.  Bulk was normal and fasciculations were absent.   Motor Tone - Muscle tone was assessed at the neck and appendages and was normal.  Reflexes - The patient's reflexes were normal in all extremities and she had no pathological reflexes.  Sensory - Light touch, temperature/pinprick, vibration and proprioception, and Romberg testing were assessed and were normal.    Coordination - The patient had normal movements in the hands and feet with no ataxia or dysmetria.  Tremor was absent.  Gait and Station - The patient's transfers, posture, gait, station, and turns were observed as normal.  Data reviewed: I personally reviewed the images and agree with  the radiology interpretations.  MRA head 08/27/12 1.  Severe vertebral basilar disease without significant flow signal identified in the proximal basilar artery. 2.  High-grade distal right vertebral artery stenosis. 3.  Occlusion of the nondominant left vertebral artery. 4.  Moderate small vessel disease within the anterior circulation. MRA neck 08/27/12 1.  High-grade stenosis of the right vertebral artery origin, the dominant vessel. 2.  High-grade stenosis of the distal right vertebral artery, just proximal to the PICA. 3.  Occlusion of the left vertebral artery with faint reconstitution likely by musculoskeletal branches. 4.  Segmental stenoses of the residual left vertebral artery without to signal beyond the PICA.  This may represent a congenitally small vessel which terminates at the PICA versus a distal occlusion. 5.  Tortuous common carotid arteries and internal carotid arteries without significant stenoses in the anterior circulation. 6.  Fetal type posterior cerebral arteries bilaterally. 7. Moderate to severe stenoses within the proximal hypoplastic basilar artery. MRI brain 08/25/12 Small areas of acute and chronic infarct in the cerebellum bilaterally. MRI brain 09/30/13 1. No acute intracranial abnormality; although this patient has had  prior  small cerebellar infarcts, no acute infarct is detected today.  2. Stable and probably inconsequential small intra sellar pituitary  cyst.  3. Incidentally noted superficial right parotid gland 2 cm nodule,  with MRI signal suggesting hypercellularity. Recommend nonemergent  ENT follow-up.  2D echo -  - Left ventricle: The cavity size was normal. There was mild concentric hypertrophy. Systolic function was normal. The   estimated ejection fraction was in the range of 60% to   65%. Wall motion was normal; there were no regional wall   motion abnormalities. Doppler parameters are consistent   with abnormal left ventricular relaxation  (grade 1   diastolic dysfunction). - Mitral valve: Mildly calcified annulus. Mildly calcified   leaflets . Mild regurgitation. TCD - 11/2012 Consistent with mild stenosis of right VA. Mildly diminished CBFV in the mid BA of unclear clinical and hemodynamical significance. CUS - 09/15/13 Negative for hemodynamically significant stenosis involving extracranial carotid arteries bilaterally. There was normal antegrade blood flow seen in the bilateral VAs.   Component     Latest Ref Rng 08/26/2012  Cholesterol     0 - 200 mg/dL 271 (H)  Triglycerides     <150 mg/dL 301 (H)  HDL     >39 mg/dL 31 (L)  Total CHOL/HDL Ratio      8.7  VLDL     0 - 40 mg/dL 60 (H)  LDL (calc)     0 - 99 mg/dL 180 (H)  Hemoglobin A1C     <5.7 % 6.0 (H)  Mean Plasma Glucose     <117 mg/dL 126 (H)  TSH     0.350 - 4.500 uIU/mL 3.501   Component     Latest Ref Rng 08/13/2013  Cholesterol, Total     100 - 199 mg/dL 175  Triglycerides     0 - 149 mg/dL 190 (H)  HDL     >39 mg/dL 41  VLDL Cholesterol Cal     5 - 40 mg/dL 38  LDL (calc)     0 - 99 mg/dL 96  Total CHOL/HDL Ratio     0.0 - 4.4 ratio units 4.3  TSH     0.450 - 4.500 uIU/mL 1.990  Free T4     0.82 - 1.77 ng/dL 0.95  Hemoglobin A1C     4.8 - 5.6 % 6.7 (H)  Estimated average glucose      146  Vitamin B-12     211 - 946 pg/mL 317    Assessment: As you may recall, she is a 73 y.o.  African American female with a diagnosis of bilaterally cerebellar stroke in 07/2012. Since then, she had no recurrent stroke but continued to have intermittent lightheadedness which has been going on for the last 20 years. Her posterior vessels are highly stenosesd or occluded, but CUS repeat showed antegrade flow bilateral VAs. We do not want BP drop too low to cause hypoperfusion in the posterior circulation. She continue to smoke, which is a big risk factor for vessel occlusion. She will try to quit.   Diagnoses from this visit: No diagnosis found.  Plan:   - smoking cessation counseling again provided. - continue plavix and statin for stroke prevention - BP control, goal 120-140, not too high or too low - check BP and glucose at home and follow up with PCP for stroke risk factor modification - b12 at low normal and recommend b12 supplement. She prefer OTC B12 and will buy herself. - she will see  her PCP next month and also consider ENT referral for parotid gland nodule found on MRI. - RTC PRN.  @ORDERS @ No orders of the defined types were placed in this encounter.   Patient Instructions  - continue plavix and lipitor for stroke prevention - quit smoking - check BP and glucose at home daily - follow up with PCP next month for stroke risk factor modification and ENT referral for parotid gland nodule found on MRI.  - take B12 supplement since your B12 is at low normal range. - follow up as needed.     Rosalin Hawking, MD PhD [facility] [address] [place], [place] [address] [phone]

## 2013-11-02 ENCOUNTER — Ambulatory Visit (HOSPITAL_BASED_OUTPATIENT_CLINIC_OR_DEPARTMENT_OTHER): Payer: Medicare Other | Attending: Internal Medicine | Admitting: Radiology

## 2013-11-02 VITALS — Ht 65.0 in | Wt 160.0 lb

## 2013-11-02 DIAGNOSIS — G4733 Obstructive sleep apnea (adult) (pediatric): Secondary | ICD-10-CM | POA: Insufficient documentation

## 2013-11-02 DIAGNOSIS — R0683 Snoring: Secondary | ICD-10-CM | POA: Diagnosis not present

## 2013-11-02 DIAGNOSIS — G4700 Insomnia, unspecified: Secondary | ICD-10-CM | POA: Insufficient documentation

## 2013-11-03 ENCOUNTER — Encounter (HOSPITAL_BASED_OUTPATIENT_CLINIC_OR_DEPARTMENT_OTHER): Payer: Medicare Other

## 2013-11-10 DIAGNOSIS — G4733 Obstructive sleep apnea (adult) (pediatric): Secondary | ICD-10-CM

## 2013-11-10 NOTE — Sleep Study (Signed)
   NAME: Donna Gilmore DATE OF BIRTH:  [birth year] MEDICAL RECORD NUMBER [id number]  LOCATION: [facility]  PHYSICIAN: Keeara Frees D  DATE OF STUDY: 11/02/2013  SLEEP STUDY TYPE: Nocturnal Polysomnogram               REFERRING PHYSICIAN: Philis Fendt, MD  INDICATION FOR STUDY: Insomnia with sleep apnea  EPWORTH SLEEPINESS SCORE:   4/24 HEIGHT: 5\' 5"  (165.1 cm)  WEIGHT: 160 lb (72.576 kg)    Body mass index is 26.63 kg/(m^2).  NECK SIZE: 13.5 in.  MEDICATIONS: Charted for review  SLEEP ARCHITECTURE: Total sleep time 248 minutes with sleep efficiency 68.5%. Stage I was 27.6%, stage II 65.3%, stage III absent, REM 7.1% of total sleep time. Sleep latency 36 minutes, REM latency 99.5 minutes, awake after sleep onset 76.5 minutes, arousal index 62.9, bedtime medication: Meclizine, atorvastatin  RESPIRATORY DATA: Apnea hypopneas index (AHI) 24 per hour. 99 total events scored including 3 obstructive apneas, 4 central apneas, 92 hypopneas. Most events were while supine. REM AHI 34.3 per hour. She did not have enough sleep or early events permit application of split CPAP titration.  OXYGEN DATA: Moderate snoring with oxygen desaturation to a nadir of 91% and mean saturation 95.7% on room air   CARDIAC DATA: Sinus rhythm with PACs and PVCs  MOVEMENT/PARASOMNIA: No significant movement disturbance, no bathroom trips  IMPRESSION/ RECOMMENDATION:   1) Difficulty initiating and maintaining sleep. Frequent spontaneous waking throughout the night. 2) Moderate obstructive sleep apnea/hypopneas syndrome, AHI 24 per hour with mostly supine events. REM AHI 34.3 per hour. Moderate snoring with oxygen desaturation to a nadir of 91% and mean saturation 95.7% on room air. She was not able to qualify for a split CPAP titration during the study. If appropriate, she can return for a dedicated CPAP titration study. Consider trial of a sedative hypnotic for the insomnia  component.  Deneise Lever Diplomate, American Board of Sleep Medicine  ELECTRONICALLY SIGNED ON:  11/10/2013, 2:22 PM [place] PH: [phone]   FX: [phone] [place]

## 2014-02-23 ENCOUNTER — Emergency Department (HOSPITAL_COMMUNITY)
Admission: EM | Admit: 2014-02-23 | Discharge: 2014-02-23 | Disposition: A | Discharge status: Home / Self Care | Payer: Medicare Other | Attending: Emergency Medicine | Admitting: Emergency Medicine

## 2014-02-23 ENCOUNTER — Emergency Department (HOSPITAL_COMMUNITY): Payer: Medicare Other

## 2014-02-23 ENCOUNTER — Encounter (HOSPITAL_COMMUNITY): Payer: Self-pay

## 2014-02-23 ENCOUNTER — Other Ambulatory Visit: Payer: Self-pay | Admitting: Cardiology

## 2014-02-23 DIAGNOSIS — Z8669 Personal history of other diseases of the nervous system and sense organs: Secondary | ICD-10-CM | POA: Diagnosis not present

## 2014-02-23 DIAGNOSIS — Z8673 Personal history of transient ischemic attack (TIA), and cerebral infarction without residual deficits: Secondary | ICD-10-CM | POA: Diagnosis not present

## 2014-02-23 DIAGNOSIS — Z7902 Long term (current) use of antithrombotics/antiplatelets: Secondary | ICD-10-CM | POA: Diagnosis not present

## 2014-02-23 DIAGNOSIS — I10 Essential (primary) hypertension: Secondary | ICD-10-CM | POA: Diagnosis not present

## 2014-02-23 DIAGNOSIS — R0789 Other chest pain: Secondary | ICD-10-CM | POA: Insufficient documentation

## 2014-02-23 DIAGNOSIS — Z720 Tobacco use: Secondary | ICD-10-CM | POA: Insufficient documentation

## 2014-02-23 DIAGNOSIS — E785 Hyperlipidemia, unspecified: Secondary | ICD-10-CM | POA: Diagnosis not present

## 2014-02-23 DIAGNOSIS — R079 Chest pain, unspecified: Secondary | ICD-10-CM | POA: Diagnosis present

## 2014-02-23 DIAGNOSIS — Z7951 Long term (current) use of inhaled steroids: Secondary | ICD-10-CM | POA: Insufficient documentation

## 2014-02-23 DIAGNOSIS — Z79899 Other long term (current) drug therapy: Secondary | ICD-10-CM | POA: Insufficient documentation

## 2014-02-23 LAB — BASIC METABOLIC PANEL
ANION GAP: 11 (ref 5–15)
BUN: 8 mg/dL (ref 6–23)
CO2: 26 mmol/L (ref 19–32)
Calcium: 10 mg/dL (ref 8.4–10.5)
Chloride: 105 mmol/L (ref 96–112)
Creatinine, Ser: 1.08 mg/dL (ref 0.50–1.10)
GFR calc Af Amer: 58 mL/min — ABNORMAL LOW (ref >90)
GFR calc non Af Amer: 50 mL/min — ABNORMAL LOW (ref >90)
Glucose, Bld: 111 mg/dL — ABNORMAL HIGH (ref 70–99)
POTASSIUM: 4.3 mmol/L (ref 3.5–5.1)
Sodium: 142 mmol/L (ref 135–145)

## 2014-02-23 LAB — I-STAT TROPONIN, ED
TROPONIN I, POC: 0 ng/mL (ref 0.00–0.08)
Troponin i, poc: 0.01 ng/mL (ref 0.00–0.08)

## 2014-02-23 LAB — CBC
HCT: 38.3 % (ref 36.0–46.0)
Hemoglobin: 12.7 g/dL (ref 12.0–15.0)
MCH: 29.1 pg (ref 26.0–34.0)
MCHC: 33.2 g/dL (ref 30.0–36.0)
MCV: 87.8 fL (ref 78.0–100.0)
Platelets: 269 10*3/uL (ref 150–400)
RBC: 4.36 MIL/uL (ref 3.87–5.11)
RDW: 14.3 % (ref 11.5–15.5)
WBC: 6.8 10*3/uL (ref 4.0–10.5)

## 2014-02-23 MED ORDER — IBUPROFEN 400 MG PO TABS
600.0000 mg | ORAL_TABLET | Freq: Once | ORAL | Status: AC
  Administered 2014-02-23: 600 mg via ORAL
  Filled 2014-02-23 (×2): qty 1

## 2014-02-23 NOTE — ED Provider Notes (Signed)
CSN: [id number]     Arrival date & time 02/23/14  1500 History   First MD Initiated Contact with Patient 02/23/14 1619     Chief Complaint  Patient presents with  . Chest Pain     (Consider location/radiation/quality/duration/timing/severity/associated sxs/prior Treatment) Patient is a 74 y.o. female presenting with chest pain. The history is provided by the patient. No language interpreter was used.  Chest Pain Pain location:  Substernal area Pain quality: aching and dull   Pain radiates to:  Does not radiate Pain radiates to the back: no   Pain severity:  Moderate Duration: sine 1230-1pm. Timing:  Constant Progression:  Unchanged Chronicity:  New Context: at rest   Relieved by:  Nothing Worsened by:  Movement and certain positions Ineffective treatments:  None tried Associated symptoms: no abdominal pain, no back pain, no cough, no diaphoresis, no fatigue, no fever, no headache, no nausea, no numbness, no palpitations, no shortness of breath, not vomiting and no weakness   Risk factors: smoking   Risk factors: no high cholesterol and no hypertension   Risk factors comment:  Cva   Past Medical History  Diagnosis Date  . Hypertension   . Dyslipidemia 08/25/2012  . Essential hypertension, benign 08/25/2012  . CVA (cerebral infarction) 08/25/2012  . Benign paroxysmal positional vertigo 08/26/2012   History reviewed. No pertinent past surgical history. Family History  Problem Relation Age of Onset  . Kidney failure Mother    History  Substance Use Topics  . Smoking status: Current Every Day Smoker  . Smokeless tobacco: Not on file  . Alcohol Use: No   OB History    No data available     Review of Systems  Constitutional: Negative for fever, chills, diaphoresis, activity change, appetite change and fatigue.  HENT: Negative for congestion, facial swelling, rhinorrhea and sore throat.   Eyes: Negative for photophobia and discharge.  Respiratory: Negative for cough,  chest tightness and shortness of breath.   Cardiovascular: Positive for chest pain. Negative for palpitations and leg swelling.  Gastrointestinal: Negative for nausea, vomiting, abdominal pain and diarrhea.  Endocrine: Negative for polydipsia and polyuria.  Genitourinary: Negative for dysuria, frequency, difficulty urinating and pelvic pain.  Musculoskeletal: Negative for back pain, arthralgias, neck pain and neck stiffness.  Skin: Negative for color change and wound.  Allergic/Immunologic: Negative for immunocompromised state.  Neurological: Negative for facial asymmetry, weakness, numbness and headaches.  Hematological: Does not bruise/bleed easily.  Psychiatric/Behavioral: Negative for confusion and agitation.      Allergies  Review of patient's allergies indicates no known allergies.  Home Medications   Prior to Admission medications   Medication Sig Start Date End Date Taking? Authorizing Provider  Alcaftadine (LASTACAFT) 0.25 % SOLN Place 1 drop into both eyes at bedtime.    Yes Historical Provider, MD  atorvastatin (LIPITOR) 40 MG tablet Take 1 tablet (40 mg total) by mouth daily. 08/28/12  Yes Eugenie Filler, MD  cetirizine (ZYRTEC) 10 MG tablet Take 10 mg by mouth daily.   Yes Historical Provider, MD  clopidogrel (PLAVIX) 75 MG tablet Take 1 tablet (75 mg total) by mouth daily with breakfast. 08/28/12  Yes Eugenie Filler, MD  fluticasone [facility]) 50 MCG/ACT nasal spray Place 1 spray into both nostrils daily.  02/12/13  Yes Historical Provider, MD  lisinopril (PRINIVIL,ZESTRIL) 40 MG tablet Take 1 tablet (40 mg total) by mouth daily. 08/28/12  Yes Eugenie Filler, MD  meclizine (ANTIVERT) 25 MG tablet Take 1 tablet (25 mg  total) by mouth 3 (three) times daily as needed for dizziness. 09/30/13  Yes Dorie Rank, MD  vitamin C (ASCORBIC ACID) 500 MG tablet Take 500 mg by mouth daily.   Yes Historical Provider, MD   BP 122/73 mmHg  Pulse 62  Temp(Src) 98.8 F (37.1 C) (Oral)   Resp 18  SpO2 99% Physical Exam  Constitutional: She is oriented to person, place, and time. She appears well-developed and well-nourished. No distress.  HENT:  Head: Normocephalic and atraumatic.  Mouth/Throat: No oropharyngeal exudate.  Eyes: Pupils are equal, round, and reactive to light.  Neck: Normal range of motion. Neck supple.  Cardiovascular: Normal rate, regular rhythm and normal heart sounds.  Exam reveals no gallop and no friction rub.   No murmur heard. Pulmonary/Chest: Effort normal and breath sounds normal. No respiratory distress. She has no wheezes. She has no rales.    Abdominal: Soft. Bowel sounds are normal. She exhibits no distension and no mass. There is no tenderness. There is no rebound and no guarding.  Musculoskeletal: Normal range of motion. She exhibits no edema or tenderness.  Neurological: She is alert and oriented to person, place, and time.  Skin: Skin is warm and dry.  Psychiatric: She has a normal mood and affect.    ED Course  Procedures (including critical care time) Labs Review Labs Reviewed  BASIC METABOLIC PANEL - Abnormal; Notable for the following:    Glucose, Bld 111 (*)    GFR calc non Af Amer 50 (*)    GFR calc Af Amer 58 (*)    All other components within normal limits  CBC  I-STAT TROPOININ, ED  Randolm Idol, ED    Imaging Review Dg Chest 2 View  02/23/2014   CLINICAL DATA:  Midchest pain, pleuritic.  Began this morning.  EXAM: CHEST  2 VIEW  COMPARISON:  09/21/2012  FINDINGS: Heart size is normal. Hilar, mediastinal and cardiac contours are unremarkable and unchanged. There is mild generalized fibrotic appearing interstitial coarsening. There are no pleural effusions.  IMPRESSION: Chronic appearing interstitial coarsening. No superimposed acute findings are evident.   Electronically Signed   By: Andreas Newport M.D.   On: 02/23/2014 15:40     EKG Interpretation   Date/Time:  Wednesday February 23 2014 15:04:47  EST Ventricular Rate:  81 PR Interval:  124 QRS Duration: 68 QT Interval:  338 QTC Calculation: 392 R Axis:   67 Text Interpretation:  Normal sinus rhythm Normal ECG No significant change  since last tracing Confirmed by [place] ([id number]) on 02/23/2014  4:27:04 PM      MDM   Final diagnoses:  Atypical chest pain    Pt is a 74 y.o. female with Pmhx as above who presents with superior sternal chest pain described as a dull pain or not.  Starting about 12:30 PM today at rest.  Pain worse with movement of her arms , coughing, constant without radiation.  She's had no associated symptoms including denial of nausea, diaphoresis, shortness of breath, leg pain or swelling.  She's had no falls or injuries.  Pain is reproducible on physical exam, primary pulmonary exam otherwise benign.  Risk factors for ACS include age, hypertension, hx of CVA and hyperlipidemia.  EKG with no acute ischemic changes.  First troponin is negative.  Chest x-ray is unremarkable. Plan for delta trop.  Delta trop negative, pt feeling improved. I suspect pain is MSK, however, givne age/risk factors, I spoke w/ cardiology to schedule outpt stress  testing.   Candiss Norse Kotch evaluation in the Emergency Department is complete. It has been determined that no acute conditions requiring further emergency intervention are present at this time. The patient/guardian have been advised of the diagnosis and plan. We have discussed signs and symptoms that warrant return to the ED, such as changes or worsening in symptoms, worsening pain, fever, inability to tolerate liquids.       Ernestina Patches, MD 02/24/14 1235

## 2014-02-23 NOTE — Discharge Instructions (Signed)
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
## 2014-02-23 NOTE — ED Notes (Signed)
Pt reporting chest pain that started earlier today.  Pt sts it feels "like a knot...real sore".  Denies any other s/s with pain.

## 2014-02-24 ENCOUNTER — Other Ambulatory Visit (HOSPITAL_COMMUNITY): Payer: Self-pay | Admitting: *Deleted

## 2014-03-10 IMAGING — CR DG CHEST 2V
2 series · 2 of 2 positions shown · non-contrast
Comparison: 08/26/2012 and CT chest 12/04/2004.

CLINICAL DATA: Sinus pain and congestion.

CHEST - 2 VIEW

[w chest pa]
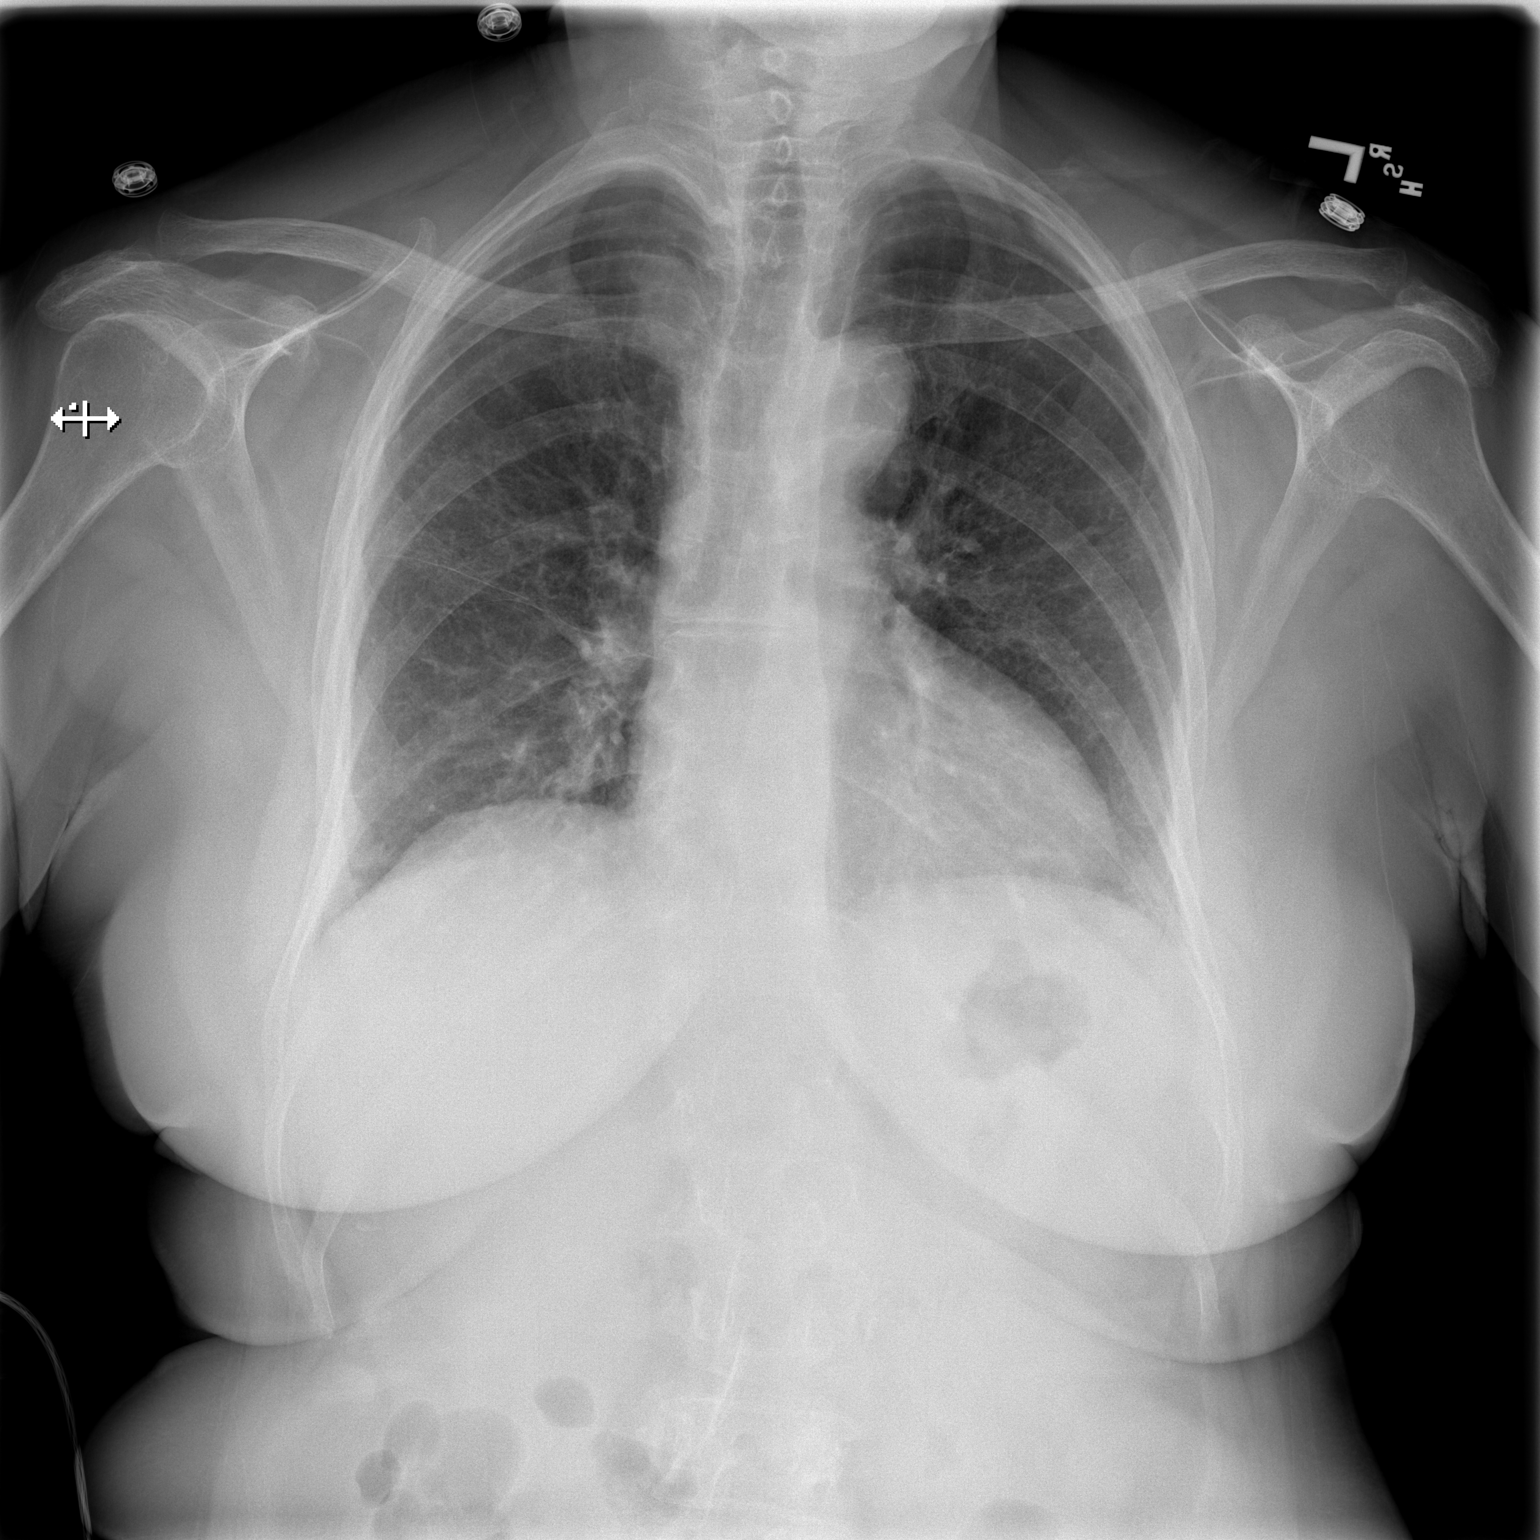

[w chest lat]
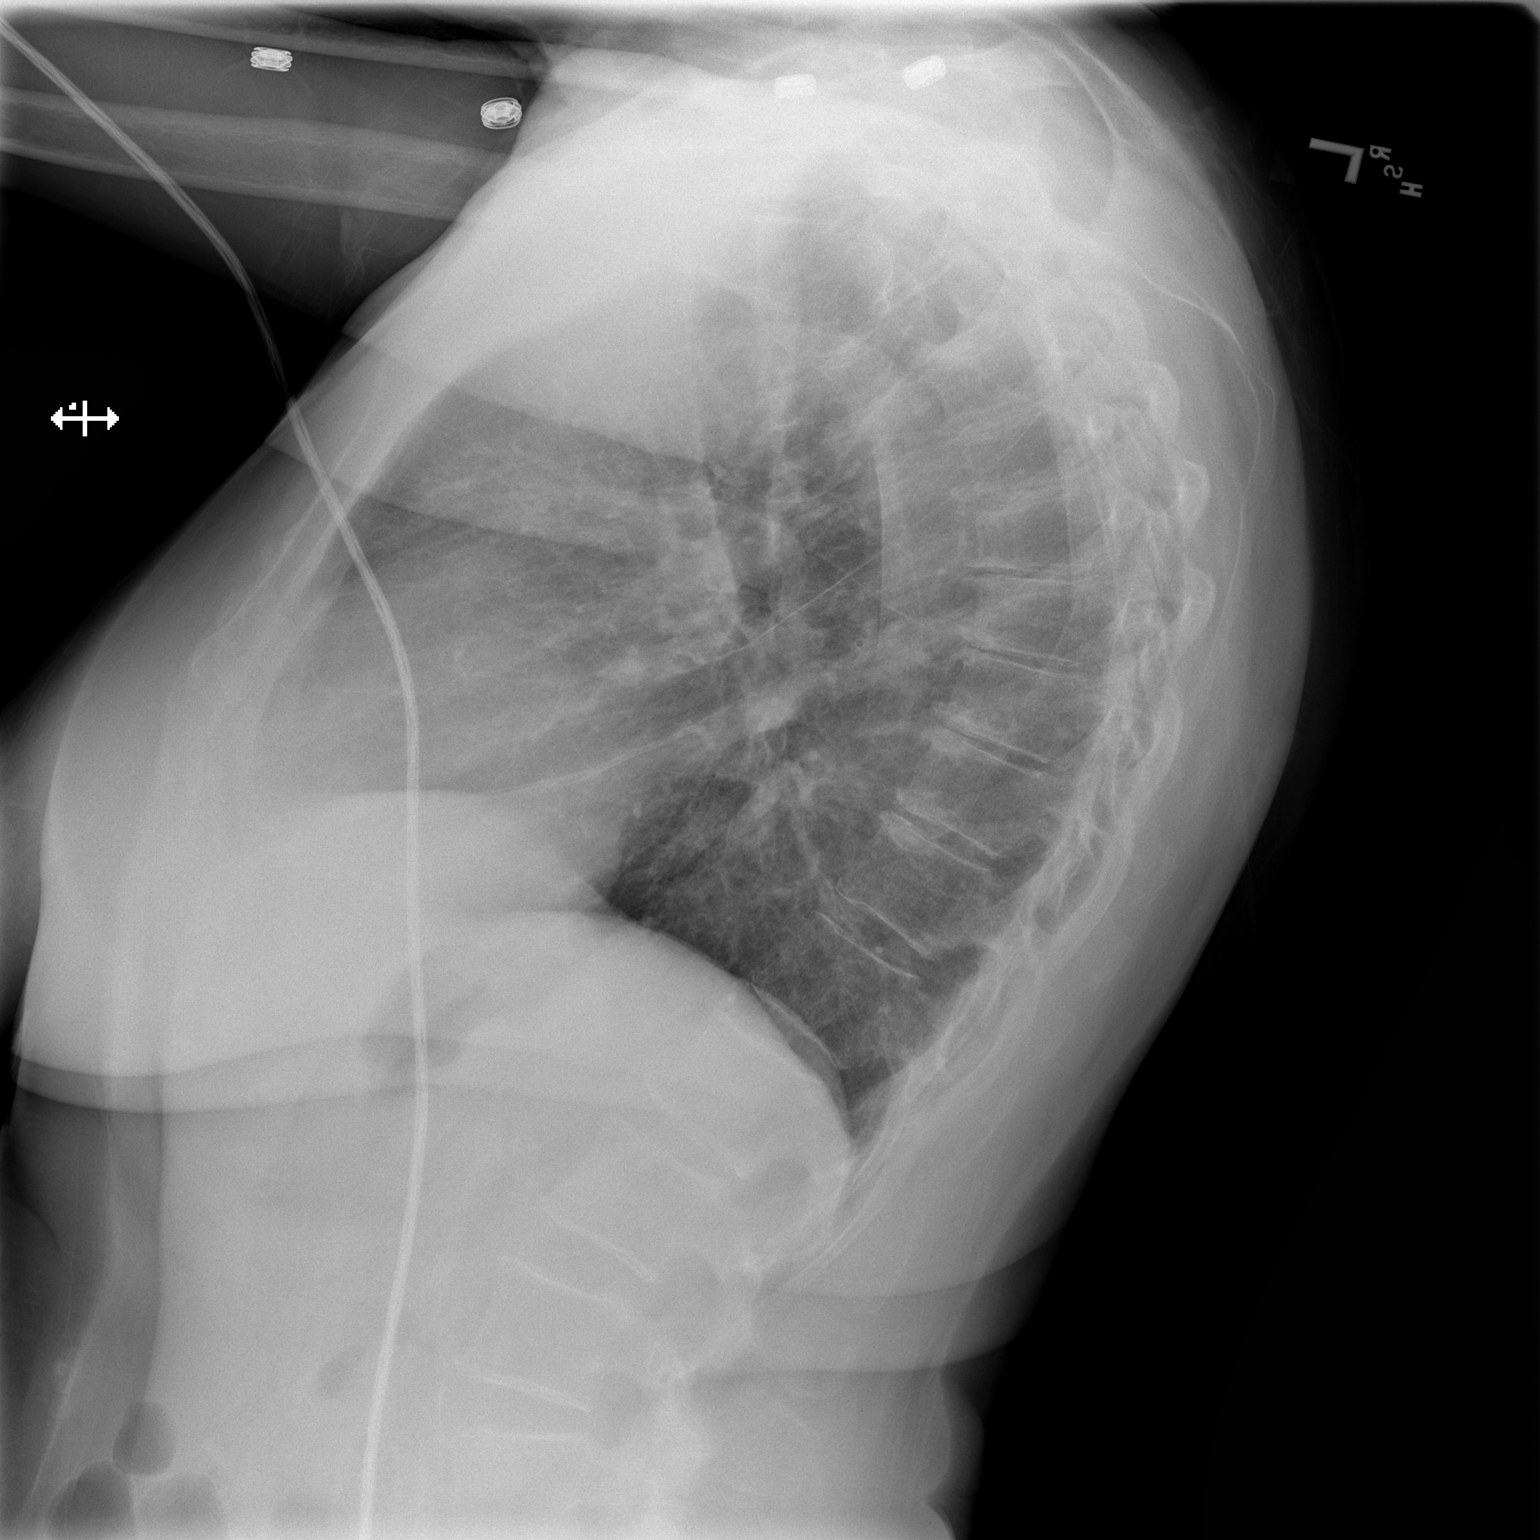

[2 of 2 positions shown; findings below may reference images not displayed]

FINDINGS: Trachea is midline.  Heart size stable.  Lungs are
somewhat low in volume with mild diffuse interstitial prominence
and indistinctness, which may be chronic, when compared with
12/04/2004.  No pleural fluid.  Degenerative changes are seen in
the spine.
IMPRESSION: Mild diffuse interstitial prominence and indistinctness are likely
chronic.  No definite acute findings.

## 2014-03-30 ENCOUNTER — Encounter (HOSPITAL_COMMUNITY): Payer: Self-pay | Admitting: *Deleted

## 2014-03-30 ENCOUNTER — Emergency Department (HOSPITAL_COMMUNITY)
Admission: EM | Admit: 2014-03-30 | Discharge: 2014-03-30 | Disposition: A | Discharge status: Home / Self Care | Payer: Medicare Other | Attending: Emergency Medicine | Admitting: Emergency Medicine

## 2014-03-30 DIAGNOSIS — H6091 Unspecified otitis externa, right ear: Secondary | ICD-10-CM | POA: Insufficient documentation

## 2014-03-30 DIAGNOSIS — E785 Hyperlipidemia, unspecified: Secondary | ICD-10-CM | POA: Insufficient documentation

## 2014-03-30 DIAGNOSIS — Z7902 Long term (current) use of antithrombotics/antiplatelets: Secondary | ICD-10-CM | POA: Diagnosis not present

## 2014-03-30 DIAGNOSIS — I10 Essential (primary) hypertension: Secondary | ICD-10-CM | POA: Insufficient documentation

## 2014-03-30 DIAGNOSIS — Z720 Tobacco use: Secondary | ICD-10-CM | POA: Diagnosis not present

## 2014-03-30 DIAGNOSIS — H73891 Other specified disorders of tympanic membrane, right ear: Secondary | ICD-10-CM | POA: Diagnosis present

## 2014-03-30 DIAGNOSIS — Z79899 Other long term (current) drug therapy: Secondary | ICD-10-CM | POA: Diagnosis not present

## 2014-03-30 MED ORDER — NEOMYCIN-POLYMYXIN-HC 3.5-10000-1 OT SUSP: 4.0000 [drp] | Freq: Four times a day (QID) | OTIC | Status: DC

## 2014-03-30 NOTE — ED Notes (Signed)
Patient states she thinks she has something in her right ear.  This nurse unable to identify anything.  BP retake 156/84

## 2014-03-30 NOTE — ED Provider Notes (Signed)
CSN: [id number]     Arrival date & time 03/30/14  0110 History  This chart was scribed for Delora Fuel, MD by Delphia Grates, ED Scribe. This patient was seen in room D32C/D32C and the patient's care was started at 3:27 AM.   Chief Complaint  Patient presents with  . Foreign Body in [place]    The history is provided by the patient. No language interpreter was used.     HPI Comments: Donna Gilmore is a 74 y.o. female who presents to the Emergency Department complaining of possible foreign body in the right ear that occurred PTA. Patient states she feels something in her right ear and was suspected it was part of her hearing aid and tried to extract it. However, she notes her hearing aide is intact and not sure of what is in her ear at this time. She denies otalgia or any other symptoms at this time.   Past Medical History  Diagnosis Date  . Hypertension   . Dyslipidemia 08/25/2012  . Essential hypertension, benign 08/25/2012  . CVA (cerebral infarction) 08/25/2012  . Benign paroxysmal positional vertigo 08/26/2012   History reviewed. No pertinent past surgical history. Family History  Problem Relation Age of Onset  . Kidney failure Mother    History  Substance Use Topics  . Smoking status: Current Every Day Smoker  . Smokeless tobacco: Not on file  . Alcohol Use: No   OB History    No data available     Review of Systems  Constitutional: Negative for fever and chills.  All other systems reviewed and are negative.     Allergies  Review of patient's allergies indicates no known allergies.  Home Medications   Prior to Admission medications   Medication Sig Start Date End Date Taking? Authorizing Provider  Alcaftadine (LASTACAFT) 0.25 % SOLN Place 1 drop into both eyes at bedtime.    Yes Historical Provider, MD  atorvastatin (LIPITOR) 40 MG tablet Take 1 tablet (40 mg total) by mouth daily. 08/28/12  Yes Eugenie Filler, MD  cetirizine (ZYRTEC) 10 MG tablet Take 10 mg by  mouth daily.   Yes Historical Provider, MD  clopidogrel (PLAVIX) 75 MG tablet Take 1 tablet (75 mg total) by mouth daily with breakfast. 08/28/12  Yes Eugenie Filler, MD  fluticasone [facility]) 50 MCG/ACT nasal spray Place 1 spray into both nostrils daily.  02/12/13  Yes Historical Provider, MD  lisinopril (PRINIVIL,ZESTRIL) 40 MG tablet Take 1 tablet (40 mg total) by mouth daily. 08/28/12  Yes Eugenie Filler, MD  vitamin C (ASCORBIC ACID) 500 MG tablet Take 500 mg by mouth daily.   Yes Historical Provider, MD  meclizine (ANTIVERT) 25 MG tablet Take 1 tablet (25 mg total) by mouth 3 (three) times daily as needed for dizziness. Patient not taking: Reported on 03/30/2014 09/30/13   Dorie Rank, MD   Triage Vitals: BP 142/127 mmHg  Pulse 89  Temp(Src) 98.7 F (37.1 C) (Oral)  Resp 18  Ht 5\' 6"  (1.676 m)  Wt 165 lb (74.844 kg)  BMI 26.64 kg/m2  SpO2 100%  Physical Exam  Constitutional: She is oriented to person, place, and time. She appears well-developed and well-nourished. No distress.  HENT:  Head: Normocephalic and atraumatic.  Thickened right TM. Edematous external auditory canal. No pain with tension on the helix  Eyes: Conjunctivae and EOM are normal. Pupils are equal, round, and reactive to light.  Neck: Neck supple. No JVD present.  Cardiovascular: Normal rate,  regular rhythm and normal heart sounds.   No murmur heard. Pulmonary/Chest: Effort normal and breath sounds normal. She has no wheezes. She has no rales. She exhibits no tenderness.  Abdominal: Soft. Bowel sounds are normal. She exhibits no distension and no mass. There is no tenderness.  Musculoskeletal: Normal range of motion. She exhibits no edema.  Lymphadenopathy:    She has no cervical adenopathy.  Neurological: She is alert and oriented to person, place, and time. No cranial nerve deficit. Coordination normal.  Skin: Skin is warm and dry. No rash noted.  Psychiatric: She has a normal mood and affect. Her behavior is  normal. Thought content normal.  Nursing note and vitals reviewed.   ED Course  Procedures (including critical care time)  DIAGNOSTIC STUDIES: Oxygen Saturation is 100% on room air, normal by my interpretation.    COORDINATION OF CARE: At [phone] Discussed treatment plan with patient which includes cortisporin. Patient agrees.    MDM   Final diagnoses:  Otitis externa, right    Foreign body sensation in the right ear. There is evidence of otitis externa and thickened tympanic membrane on that side however I'm not sure if that it is a primary problem or related to her trying to take out a nonexistent foreign body. She is treated with Cortisporin otic suspension and is referred to ENT for follow-up.  I personally performed the services described in this documentation, which was scribed in my presence. The recorded information has been reviewed and is accurate.      Delora Fuel, MD 59/56/38 [id number]

## 2014-03-30 NOTE — Discharge Instructions (Signed)
Otitis Externa Otitis externa is a bacterial or fungal infection of the outer ear canal. This is the area from the eardrum to the outside of the ear. Otitis externa is sometimes called "swimmer's ear." CAUSES  Possible causes of infection include:  Swimming in dirty water.  Moisture remaining in the ear after swimming or bathing.  Mild injury (trauma) to the ear.  Objects stuck in the ear (foreign body).  Cuts or scrapes (abrasions) on the outside of the ear. SIGNS AND SYMPTOMS  The first symptom of infection is often itching in the ear canal. Later signs and symptoms may include swelling and redness of the ear canal, ear pain, and yellowish-white fluid (pus) coming from the ear. The ear pain may be worse when pulling on the earlobe. DIAGNOSIS  Your health care provider will perform a physical exam. A sample of fluid may be taken from the ear and examined for bacteria or fungi. TREATMENT  Antibiotic ear drops are often given for 10 to 14 days. Treatment may also include pain medicine or corticosteroids to reduce itching and swelling. HOME CARE INSTRUCTIONS   Apply antibiotic ear drops to the ear canal as prescribed by your health care provider.  Take medicines only as directed by your health care provider.  If you have diabetes, follow any additional treatment instructions from your health care provider.  Keep all follow-up visits as directed by your health care provider. PREVENTION   Keep your ear dry. Use the corner of a towel to absorb water out of the ear canal after swimming or bathing.  Avoid scratching or putting objects inside your ear. This can damage the ear canal or remove the protective wax that lines the canal. This makes it easier for bacteria and fungi to grow.  Avoid swimming in lakes, polluted water, or poorly chlorinated pools.  You may use ear drops made of rubbing alcohol and vinegar after swimming. Combine equal parts of white vinegar and alcohol in a bottle.  Put 3 or 4 drops into each ear after swimming. SEEK MEDICAL CARE IF:   You have a fever.  Your ear is still red, swollen, painful, or draining pus after 3 days.  Your redness, swelling, or pain gets worse.  You have a severe headache.  You have redness, swelling, pain, or tenderness in the area behind your ear. MAKE SURE YOU:   Understand these instructions.  Will watch your condition.  Will get help right away if you are not doing well or get worse. Document Released: 01/14/2005 Document Revised: 05/31/2013 Document Reviewed: 01/31/2011 [facility] Patient Information 2015 [place], [place]. This information is not intended to replace advice given to you by your health care provider. Make sure you discuss any questions you have with your health care provider.  Hydrocortisone; Neomycin; Polymyxin B ear suspension What is this medicine? HYDROCORTISONE; NEOMYCIN; and POLYMYXIN B (hye droe KOR ti sone; nee oh MYE sin; pol i MIX in B) is used to treat ear infections. This medicine may be used for other purposes; ask your health care provider or pharmacist if you have questions. COMMON BRAND NAME(S): AK-Spore HC, AK-Spore HC Otic, Antibiotic Otic, Aural, Cortisporin, Cortomycin, Duomycin-HC, Oti-Sone, Oticin HC, Otimar, Pediotic, Uad What should I tell my health care provider before I take this medicine? They need to know if you have any of these conditions: -any other active infections -chronic ear infections or fluid in the ear -perforated ear drum -an unusual or allergic reaction to hydrocortisone, neomycin, polymyxin B, sulfites, other  medicines, foods, dyes, or preservatives -pregnant or trying to get pregnant -breast-feeding How should I use this medicine? This medicine is only for use in the ears. Wash your hands with soap and water. Clean your ear of any fluid that can be easily removed. Do not insert any object or swab into the ear canal. Gently warm the bottle by holding it in the  hand for 1 to 2 minutes. Lie down on your side with the infected ear up. Try not to touch the tip of the dropper to your ear, fingertips, or other surface. Shake the bottle immediately before using. Squeeze the bottle gently to put the prescribed number of drops in the ear canal. Stay in this position for 30 to 60 seconds to help the drops soak into the ear. Repeat the steps for the other ear if both ears are infected. Do not use your medicine more often than directed. Finish the full course of medicine prescribed by your doctor or health care professional even if you think your condition is better. Talk to your pediatrician regarding the use of this medicine in children. While this drug may be prescribed for selected conditions, precautions do apply. Overdosage: If you think you have taken too much of this medicine contact a poison control center or emergency room at once. NOTE: This medicine is only for you. Do not share this medicine with others. What if I miss a dose? If you miss a dose, use it as soon as you can. If it is almost time for your next dose, use only that dose. Do not take double or extra doses. What may interact with this medicine? Interactions are not expected. Do not use other ear products without talking to your doctor or health care professional. This list may not describe all possible interactions. Give your health care provider a list of all the medicines, herbs, non-prescription drugs, or dietary supplements you use. Also tell them if you smoke, drink alcohol, or use illegal drugs. Some items may interact with your medicine. What should I watch for while using this medicine? Tell your doctor or health care professional if your ear infection does not get better in a few days. Do not use longer than 10 days unless instructed by your doctor or health care professional. If rash or allergic reaction occurs, stop the product immediately and contact your physician. It is important that  you keep the infected ear(s) clean and dry. When bathing, try not to get the infected ear(s) wet. Do not go swimming unless your doctor or health care professional has told you otherwise. To prevent the spread of infection, do not share ear products, or share towels and washcloths with anyone else. What side effects may I notice from receiving this medicine? Side effects that you should report to your doctor or health care professional as soon as possible: -rash -red, itchy, dry scaly skin at the affected site -worsening ear pain Side effects that usually do not require medical attention (report to your doctor or health care professional if they continue or are bothersome): -abnormal sensation in the ear -burning or stinging while putting the drops in the ear This list may not describe all possible side effects. Call your doctor for medical advice about side effects. You may report side effects to FDA at 1-800-FDA-1088. Where should I keep my medicine? Keep out of the reach of children. Store at room temperature between 15 and 25 degrees C (59 and 77 degrees F). Do not freeze. Throw  away any unused medicine after the expiration date. NOTE: This sheet is a summary. It may not cover all possible information. If you have questions about this medicine, talk to your doctor, pharmacist, or health care provider.  2015, [employer]vier/[employer] Standard. (2007-05-29 15:49:00)

## 2014-04-11 ENCOUNTER — Other Ambulatory Visit: Payer: Self-pay

## 2014-04-11 DIAGNOSIS — Z1231 Encounter for screening mammogram for malignant neoplasm of breast: Secondary | ICD-10-CM

## 2014-04-15 ENCOUNTER — Ambulatory Visit
Admission: RE | Admit: 2014-04-15 | Discharge: 2014-04-15 | Disposition: A | Discharge status: Home / Self Care | Payer: Medicare Other | Source: Ambulatory Visit

## 2014-04-15 DIAGNOSIS — Z1231 Encounter for screening mammogram for malignant neoplasm of breast: Secondary | ICD-10-CM

## 2014-12-21 ENCOUNTER — Other Ambulatory Visit: Payer: Self-pay | Admitting: Internal Medicine

## 2014-12-21 DIAGNOSIS — E2839 Other primary ovarian failure: Secondary | ICD-10-CM

## 2015-02-14 ENCOUNTER — Ambulatory Visit
Admission: RE | Admit: 2015-02-14 | Discharge: 2015-02-14 | Disposition: A | Discharge status: Home / Self Care | Payer: Medicare Other | Source: Ambulatory Visit | Attending: Internal Medicine | Admitting: Internal Medicine

## 2015-02-14 DIAGNOSIS — E2839 Other primary ovarian failure: Secondary | ICD-10-CM

## 2015-04-07 ENCOUNTER — Encounter (HOSPITAL_COMMUNITY): Payer: Self-pay | Admitting: Emergency Medicine

## 2015-04-07 ENCOUNTER — Emergency Department (HOSPITAL_COMMUNITY)
Admission: EM | Admit: 2015-04-07 | Discharge: 2015-04-07 | Disposition: A | Discharge status: Home / Self Care | Payer: Medicare Other | Source: Home / Self Care | Attending: Emergency Medicine | Admitting: Emergency Medicine

## 2015-04-07 ENCOUNTER — Other Ambulatory Visit (HOSPITAL_COMMUNITY)
Admission: RE | Admit: 2015-04-07 | Discharge: 2015-04-07 | Disposition: A | Discharge status: Home / Self Care | Payer: Medicare Other | Source: Ambulatory Visit | Attending: Emergency Medicine | Admitting: Emergency Medicine

## 2015-04-07 DIAGNOSIS — R109 Unspecified abdominal pain: Secondary | ICD-10-CM | POA: Insufficient documentation

## 2015-04-07 DIAGNOSIS — J018 Other acute sinusitis: Secondary | ICD-10-CM

## 2015-04-07 DIAGNOSIS — J029 Acute pharyngitis, unspecified: Secondary | ICD-10-CM | POA: Insufficient documentation

## 2015-04-07 LAB — POCT URINALYSIS DIP (DEVICE)
Bilirubin Urine: NEGATIVE
GLUCOSE, UA: NEGATIVE mg/dL
Ketones, ur: NEGATIVE mg/dL
LEUKOCYTES UA: NEGATIVE
Nitrite: NEGATIVE
Protein, ur: NEGATIVE mg/dL
Specific Gravity, Urine: 1.02 (ref 1.005–1.030)
UROBILINOGEN UA: 0.2 mg/dL (ref 0.0–1.0)
pH: 7.5 (ref 5.0–8.0)

## 2015-04-07 LAB — POCT I-STAT, CHEM 8
BUN: 8 mg/dL (ref 6–20)
Calcium, Ion: 1.22 mmol/L (ref 1.13–1.30)
Chloride: 105 mmol/L (ref 101–111)
Creatinine, Ser: 0.9 mg/dL (ref 0.44–1.00)
Glucose, Bld: 115 mg/dL — ABNORMAL HIGH (ref 65–99)
HEMATOCRIT: 43 % (ref 36.0–46.0)
Hemoglobin: 14.6 g/dL (ref 12.0–15.0)
Potassium: 4 mmol/L (ref 3.5–5.1)
SODIUM: 143 mmol/L (ref 135–145)
TCO2: 28 mmol/L (ref 0–100)

## 2015-04-07 MED ORDER — FLUTICASONE PROPIONATE 50 MCG/ACT NA SUSP: 1.0000 | Freq: Every day | NASAL | Status: DC

## 2015-04-07 MED ORDER — TAMSULOSIN HCL 0.4 MG PO CAPS: 0.4000 mg | ORAL_CAPSULE | Freq: Every day | ORAL | Status: DC

## 2015-04-07 MED ORDER — PREDNISONE 50 MG PO TABS: ORAL_TABLET | ORAL | Status: DC

## 2015-04-07 NOTE — Discharge Instructions (Signed)
You do have some inflammation of your sinuses. Take prednisone daily for 5 days. Use Flonase daily for the next week. Between these 2 medicines, your sinuses should clear up.  The pain in your back could be coming from muscle soreness, but I am concerned about either a kidney stone or infection since you have some blood in your urine. I have sent the urine for culture. We will call if you need antibiotics. Please take Flomax daily for the next 2 weeks. If there is a stone, this will help it pass.  I want you to follow-up with your primary care doctor in about 2 weeks to recheck your urine. If you have persistent blood in your urine you'll need additional evaluation. If things are getting worse, please go to the emergency room.

## 2015-04-07 NOTE — ED Notes (Signed)
Feeling lightheaded, meclizine does not seem to help this.  Patient says blood pressure has been reading higher than usual for the past 3-4 days.   Reports left lower back pain for one week, denies urinary symptoms.   Runny nose, clearing of throat.

## 2015-04-07 NOTE — ED Provider Notes (Signed)
CSN: [id number]     Arrival date & time 04/07/15  1303 History   First MD Initiated Contact with Patient 04/07/15 1331     No chief complaint on file.  (Consider location/radiation/quality/duration/timing/severity/associated sxs/prior Treatment) HPI  She is a 75 year old woman here for evaluation of sinus issues as well as left back pain.  She states for the last several days she has had sinus congestion and runny nose. She will have intermittent sinus pressure and pain that radiates to her bilateral ears. She states it'll also make her feel a little dizzy. She does have a problem with vertigo. She states her meclizine does not help with this dizziness. No fevers. No cough.  She also states for the last week she has had left flank pain. She states it's okay when she wakes up in the morning, but by 4 or 5 in the afternoon it'll become quite sore and painful.  She typically will take a Tylenol and go to bed. She denies any dysuria or hematuria. She does report increased frequency, but attributes this to increased water intake. She did start going to the gym about a month ago when her doctor told her she was borderline diabetic. She denies any specific injury or trauma.  She also notes her blood pressure has been more elevated than normal in the [id number] to [id number] systolic.  Past Medical History  Diagnosis Date  . Hypertension   . Dyslipidemia 08/25/2012  . Essential hypertension, benign 08/25/2012  . CVA (cerebral infarction) 08/25/2012  . Benign paroxysmal positional vertigo 08/26/2012   History reviewed. No pertinent past surgical history. Family History  Problem Relation Age of Onset  . Kidney failure Mother    Social History  Substance Use Topics  . Smoking status: Current Every Day Smoker  . Smokeless tobacco: None  . Alcohol Use: No   OB History    No data available     Review of Systems As in history of present illness Allergies  Review of patient's allergies indicates no known  allergies.  Home Medications   Prior to Admission medications   Medication Sig Start Date End Date Taking? Authorizing Provider  Alcaftadine (LASTACAFT) 0.25 % SOLN Place 1 drop into both eyes at bedtime.     Historical Provider, MD  atorvastatin (LIPITOR) 40 MG tablet Take 1 tablet (40 mg total) by mouth daily. 08/28/12   Eugenie Filler, MD  cetirizine (ZYRTEC) 10 MG tablet Take 10 mg by mouth daily.    Historical Provider, MD  clopidogrel (PLAVIX) 75 MG tablet Take 1 tablet (75 mg total) by mouth daily with breakfast. 08/28/12   Eugenie Filler, MD  fluticasone [facility]) 50 MCG/ACT nasal spray Place 1 spray into both nostrils daily. 04/07/15   Melony Overly, MD  lisinopril (PRINIVIL,ZESTRIL) 40 MG tablet Take 1 tablet (40 mg total) by mouth daily. 08/28/12   Eugenie Filler, MD  meclizine (ANTIVERT) 25 MG tablet Take 1 tablet (25 mg total) by mouth 3 (three) times daily as needed for dizziness. 09/30/13   Dorie Rank, MD  predniSONE (DELTASONE) 50 MG tablet Take 1 pill daily for 5 days. 04/07/15   Melony Overly, MD  tamsulosin (FLOMAX) 0.4 MG CAPS capsule Take 1 capsule (0.4 mg total) by mouth daily. 04/07/15   Melony Overly, MD  vitamin C (ASCORBIC ACID) 500 MG tablet Take 500 mg by mouth daily.    Historical Provider, MD   Meds Ordered and Administered this Visit  Medications - No  data to display  BP 147/80 mmHg  Pulse 88  Temp(Src) 98 F (36.7 C) (Oral)  Resp 16  SpO2 98% No data found.   Physical Exam  Constitutional: She is oriented to person, place, and time. She appears well-developed and well-nourished. No distress.  HENT:  Mouth/Throat: No oropharyngeal exudate.  TMs normal bilaterally. Nasal mucosa slightly erythematous and boggy. No sinus tenderness. Small amount of postnasal drainage seen.  Neck: Neck supple.  Cardiovascular: Normal rate, regular rhythm and normal heart sounds.   No murmur heard. Pulmonary/Chest: Effort normal and breath sounds normal. No respiratory  distress. She has no wheezes. She has no rales.  Musculoskeletal:  Back: No vertebral tenderness or step-offs. She is tender to palpation in the left flank area.  Lymphadenopathy:    She has no cervical adenopathy.  Neurological: She is alert and oriented to person, place, and time.    ED Course  Procedures (including critical care time)  Labs Review Labs Reviewed  POCT URINALYSIS DIP (DEVICE) - Abnormal; Notable for the following:    Hgb urine dipstick TRACE (*)    All other components within normal limits  POCT I-STAT, CHEM 8 - Abnormal; Notable for the following:    Glucose, Bld 115 (*)    All other components within normal limits  URINE CULTURE    Imaging Review No results found.    MDM   1. Other acute sinusitis   2. Left flank pain    We'll treat likely viral sinusitis with Flonase and prednisone. No indication for an about except this time.  Flank pain is likely musculoskeletal, but with some blood in her urine there is concern for kidney stone versus infection. She is a current smoker so bladder cancer is also in the differential. I sent the urine for culture. Will treat based on results. Recommended 14 days of Flomax for possible kidney stone. Her blood pressure is likely elevated secondary to pain from her back.  I requested that she follow-up with her PCP in 2 weeks to recheck her urine and make sure the blood has resolved. If her symptoms worsen, she is to go to the emergency room.    Melony Overly, MD 04/07/15 [phone]

## 2015-04-09 LAB — URINE CULTURE

## 2015-04-14 ENCOUNTER — Telehealth (HOSPITAL_COMMUNITY): Payer: Self-pay | Admitting: Emergency Medicine

## 2015-04-14 NOTE — ED Notes (Signed)
x1 attempt  LM on pt's VM [phone]  Need to give lab results from recent visit on 3/10  Per Dr. Valere Dross,  Urine cx does not clearly demonstrate a UTI. UA at [facility] visit 04/07/15 without bacteria, without WBCs. No dysuria recorded in visit note.  Followup pcp/Edwin Avbuere for further evaluation of flank pain. LM  Will try later.

## 2016-01-17 ENCOUNTER — Ambulatory Visit (HOSPITAL_COMMUNITY)
Admission: RE | Admit: 2016-01-17 | Discharge: 2016-01-17 | Disposition: A | Discharge status: Home / Self Care | Payer: Medicare Other | Source: Ambulatory Visit | Attending: Vascular Surgery | Admitting: Vascular Surgery

## 2016-01-17 DIAGNOSIS — I739 Peripheral vascular disease, unspecified: Secondary | ICD-10-CM | POA: Insufficient documentation

## 2016-01-18 ENCOUNTER — Other Ambulatory Visit (HOSPITAL_COMMUNITY): Payer: Self-pay | Admitting: Internal Medicine

## 2016-01-18 DIAGNOSIS — I739 Peripheral vascular disease, unspecified: Secondary | ICD-10-CM

## 2016-04-16 ENCOUNTER — Encounter (HOSPITAL_COMMUNITY): Payer: Self-pay | Admitting: Family Medicine

## 2016-04-16 ENCOUNTER — Ambulatory Visit (HOSPITAL_COMMUNITY)
Admission: EM | Admit: 2016-04-16 | Discharge: 2016-04-16 | Disposition: A | Discharge status: Home / Self Care | Payer: Medicare Other | Attending: Family Medicine | Admitting: Family Medicine

## 2016-04-16 DIAGNOSIS — R1310 Dysphagia, unspecified: Secondary | ICD-10-CM

## 2016-04-16 MED ORDER — OMEPRAZOLE 40 MG PO CPDR: 40.0000 mg | DELAYED_RELEASE_CAPSULE | Freq: Every day | ORAL | 0 refills | Status: DC

## 2016-04-16 NOTE — ED Triage Notes (Signed)
Pt sts that she was eating today and she feels like something was lodged in her throat. sts same episode last week and had pill lodged in throat.

## 2016-04-16 NOTE — Discharge Instructions (Signed)
The signs and symptoms you have are consistent with long-term damage from reflux. I prescribed a medicine called omeprazole, take one tablet daily for 14 days. If at the end of this time you're still experiencing your symptoms, go to your primary care doctor as he will need to refer you to a GI specialist for further evaluation than what can be done here in the urgent care

## 2016-04-16 NOTE — ED Provider Notes (Signed)
CSN: [id number]     Arrival date & time 04/16/16  1852 History   First MD Initiated Contact with Patient 04/16/16 1940     Chief Complaint  Patient presents with  . Sore Throat   (Consider location/radiation/quality/duration/timing/severity/associated sxs/prior Treatment) 76 year old female presents to clinic for evaluation of a sensation of a "pill in her throat" she states he had a similar episode 2 weeks ago. She states she is been drinking water, soda, and other fluids most of the day, but she still feels as if there is a sensation in her throat. She denies any chest pain, cough, wheezing, shortness of breath, she has not vomited, or had other symptoms. She states she does have a prior history of reflux, it is not currently treated for it.   The history is provided by the patient.  Sore Throat     Past Medical History:  Diagnosis Date  . Benign paroxysmal positional vertigo 08/26/2012  . CVA (cerebral infarction) 08/25/2012  . Dyslipidemia 08/25/2012  . Essential hypertension, benign 08/25/2012  . Hypertension    History reviewed. No pertinent surgical history. Family History  Problem Relation Age of Onset  . Kidney failure Mother    Social History  Substance Use Topics  . Smoking status: Current Every Day Smoker  . Smokeless tobacco: Never Used  . Alcohol use No   OB History    No data available     Review of Systems  Reason unable to perform ROS: as covered in HPI.  All other systems reviewed and are negative.   Allergies  Patient has no known allergies.  Home Medications   Prior to Admission medications   Medication Sig Start Date End Date Taking? Authorizing Provider  Alcaftadine (LASTACAFT) 0.25 % SOLN Place 1 drop into both eyes at bedtime.     Historical Provider, MD  atorvastatin (LIPITOR) 40 MG tablet Take 1 tablet (40 mg total) by mouth daily. 08/28/12   Eugenie Filler, MD  cetirizine (ZYRTEC) 10 MG tablet Take 10 mg by mouth daily.    Historical  Provider, MD  clopidogrel (PLAVIX) 75 MG tablet Take 1 tablet (75 mg total) by mouth daily with breakfast. 08/28/12   Eugenie Filler, MD  fluticasone [facility]) 50 MCG/ACT nasal spray Place 1 spray into both nostrils daily. 04/07/15   Melony Overly, MD  lisinopril (PRINIVIL,ZESTRIL) 40 MG tablet Take 1 tablet (40 mg total) by mouth daily. 08/28/12   Eugenie Filler, MD  meclizine (ANTIVERT) 25 MG tablet Take 1 tablet (25 mg total) by mouth 3 (three) times daily as needed for dizziness. 09/30/13   Dorie Rank, MD  omeprazole (PRILOSEC) 40 MG capsule Take 1 capsule (40 mg total) by mouth daily. 04/16/16   Barnet Glasgow, NP  predniSONE (DELTASONE) 50 MG tablet Take 1 pill daily for 5 days. 04/07/15   Melony Overly, MD  tamsulosin (FLOMAX) 0.4 MG CAPS capsule Take 1 capsule (0.4 mg total) by mouth daily. 04/07/15   Melony Overly, MD  vitamin C (ASCORBIC ACID) 500 MG tablet Take 500 mg by mouth daily.    Historical Provider, MD   Meds Ordered and Administered this Visit  Medications - No data to display  BP (!) 160/83 (BP Location: Left Arm)   Pulse 68   Temp 98 F (36.7 C) (Oral)   Resp 16   SpO2 100%  No data found.   Physical Exam  Constitutional: She is oriented to person, place, and time. She  appears well-developed and well-nourished. No distress.  HENT:  Head: Normocephalic and atraumatic.  Right Ear: External ear normal.  Left Ear: External ear normal.  Eyes: EOM are normal. Pupils are equal, round, and reactive to light.  Neck: Normal range of motion. Neck supple. No JVD present.  Cardiovascular: Normal rate and regular rhythm.   Pulmonary/Chest: Effort normal and breath sounds normal. No stridor.  Abdominal: Soft. Bowel sounds are normal. She exhibits no distension. There is no tenderness.  Musculoskeletal: She exhibits no edema or tenderness.  Lymphadenopathy:    She has no cervical adenopathy.  Neurological: She is alert and oriented to person, place, and time.  Skin: Skin is warm  and dry. Capillary refill takes less than 2 seconds. She is not diaphoretic.  Psychiatric: She has a normal mood and affect. Her behavior is normal.  Nursing note and vitals reviewed.   Urgent Care Course     Procedures (including critical care time)  Labs Review Labs Reviewed - No data to display  Imaging Review No results found.    MDM   1. Dysphagia, unspecified type    Patient given 14 day prescription for Prilosec. She was advised to follow-up with her primary care provider if she continued to have symptoms, for referral to GI.     Barnet Glasgow, NP 04/16/16 2059

## 2016-08-12 ENCOUNTER — Encounter (HOSPITAL_COMMUNITY): Payer: Self-pay | Admitting: Emergency Medicine

## 2016-08-12 ENCOUNTER — Ambulatory Visit (HOSPITAL_COMMUNITY)
Admission: EM | Admit: 2016-08-12 | Discharge: 2016-08-12 | Disposition: A | Discharge status: Home / Self Care | Payer: Medicare Other | Attending: Family Medicine | Admitting: Family Medicine

## 2016-08-12 DIAGNOSIS — R42 Dizziness and giddiness: Secondary | ICD-10-CM | POA: Diagnosis not present

## 2016-08-12 DIAGNOSIS — I159 Secondary hypertension, unspecified: Secondary | ICD-10-CM

## 2016-08-12 DIAGNOSIS — R0981 Nasal congestion: Secondary | ICD-10-CM

## 2016-08-12 NOTE — ED Provider Notes (Signed)
CSN: [id number]     Arrival date & time 08/12/16  1007 History   First MD Initiated Contact with Patient 08/12/16 1040     Chief Complaint  Patient presents with  . Dizziness   (Consider location/radiation/quality/duration/timing/severity/associated sxs/prior Treatment) 76 year old female presents with a chief complaint of intermittent headaches, dizziness, and congestion. She does have a past history of vertigo, and is currently taking meclizine without relief. She reportedly stopped taking her lisinopril 4-5 days ago, because she read on the Internet it was bad for her, and has had blood pressures in the 170s over 100s. She brings her blood pressure log with her, and has had consistently elevated blood pressure last several days. She is also complaining of nasal congestion and ear pressure as well. At the time of assessment she is not experiencing any headaches, she has no blurred vision, no chest pain, no unilateral weakness, reportedly no difficulty speaking.   The history is provided by the patient.  Dizziness  Associated symptoms: headaches     Past Medical History:  Diagnosis Date  . Benign paroxysmal positional vertigo 08/26/2012  . CVA (cerebral infarction) 08/25/2012  . Dyslipidemia 08/25/2012  . Essential hypertension, benign 08/25/2012  . Hypertension    History reviewed. No pertinent surgical history. Family History  Problem Relation Age of Onset  . Kidney failure Mother    Social History  Substance Use Topics  . Smoking status: Current Every Day Smoker  . Smokeless tobacco: Never Used  . Alcohol use No   OB History    No data available     Review of Systems  Constitutional: Negative.   HENT: Positive for congestion, ear pain (ear pressure) and sinus pain.   Respiratory: Negative.   Cardiovascular: Negative.   Gastrointestinal: Negative.   Musculoskeletal: Negative.   Skin: Negative.   Neurological: Positive for dizziness and headaches. Negative for  light-headedness.    Allergies  Patient has no known allergies.  Home Medications   Prior to Admission medications   Medication Sig Start Date End Date Taking? Authorizing Provider  Alcaftadine (LASTACAFT) 0.25 % SOLN Place 1 drop into both eyes at bedtime.     [provider]  atorvastatin (LIPITOR) 40 MG tablet Take 1 tablet (40 mg total) by mouth daily. 08/28/12   Eugenie Filler, MD  cetirizine (ZYRTEC) 10 MG tablet Take 10 mg by mouth daily.    [provider]  clopidogrel (PLAVIX) 75 MG tablet Take 1 tablet (75 mg total) by mouth daily with breakfast. 08/28/12   Eugenie Filler, MD  fluticasone [facility]) 50 MCG/ACT nasal spray Place 1 spray into both nostrils daily. 04/07/15   Melony Overly, MD  lisinopril (PRINIVIL,ZESTRIL) 40 MG tablet Take 1 tablet (40 mg total) by mouth daily. 08/28/12   Eugenie Filler, MD  meclizine (ANTIVERT) 25 MG tablet Take 1 tablet (25 mg total) by mouth 3 (three) times daily as needed for dizziness. 09/30/13   Dorie Rank, MD  omeprazole (PRILOSEC) 40 MG capsule Take 1 capsule (40 mg total) by mouth daily. 04/16/16   Barnet Glasgow, NP  predniSONE (DELTASONE) 50 MG tablet Take 1 pill daily for 5 days. 04/07/15   Melony Overly, MD  tamsulosin (FLOMAX) 0.4 MG CAPS capsule Take 1 capsule (0.4 mg total) by mouth daily. 04/07/15   Melony Overly, MD  vitamin C (ASCORBIC ACID) 500 MG tablet Take 500 mg by mouth daily.    [provider]   Meds Ordered and Administered this  Visit  Medications - No data to display  BP (!) 143/72 (BP Location: Right Arm)   Pulse (!) 57   Temp 98.5 F (36.9 C) (Oral)   Resp 18   SpO2 100%  No data found.   Physical Exam  Constitutional: She is oriented to person, place, and time. She appears well-developed and well-nourished. No distress.  HENT:  Head: Normocephalic.  Right Ear: Tympanic membrane and external ear normal.  Left Ear: Tympanic membrane and external ear normal.  Nose: Mucosal edema  and rhinorrhea present.  Mouth/Throat: Uvula is midline and oropharynx is clear and moist.  Eyes: Pupils are equal, round, and reactive to light. Conjunctivae and EOM are normal.  Neck: Normal range of motion.  Cardiovascular: Normal rate and regular rhythm.   Pulmonary/Chest: Effort normal and breath sounds normal.  Musculoskeletal: She exhibits no edema.  Neurological: She is alert and oriented to person, place, and time. No cranial nerve deficit. She exhibits normal muscle tone.  Skin: Skin is warm and dry. Capillary refill takes less than 2 seconds. She is not diaphoretic.  Nursing note and vitals reviewed.   Urgent Care Course     Procedures (including critical care time)  Labs Review Labs Reviewed - No data to display  Imaging Review No results found.      MDM   1. Dizziness   2. Congestion of nasal sinus   3. Secondary hypertension    1. Continue Meclizine, also continue Flonase and Zyrtec, rest, plenty of fluids, follow up w/PCP as needed.  2. Congestion: Continue Flonase and Zyrtec, most likely will improve in 1 week, follow up with PCP as needed.  3. HTN: Restart lisinopril and take as directed by her PCP, keep log of BP readings, schedule and appointment with her PCP to discuss any concerns regarding this medication. Counseling given on warning signs and symptoms of CVA or other complications, go to ER if any symptoms occur.      Barnet Glasgow, NP 08/12/16 1139

## 2016-08-12 NOTE — Discharge Instructions (Signed)
Continue to take your blood pressure medicine as prescribed by your primary care provider. Keep a log of your readings and a list of your concerns and contact his office to schedule an appointment for follow up care.  Currently, you are not experiencing any neurological symptoms consistent of a stroke. If your headaches return, or if you have difficulty speaking, one sided weakness, changes in vision, go to the ER.  For your congestion, continue your flonase and zyrtec. You should start to feel better in 5 days to one week. Follow up with your primary care provider for further evaluation as needed.

## 2016-08-12 NOTE — ED Triage Notes (Signed)
The patient presented to the [facility] with a complaint of dizziness x 3 days. The patient reported that she has had nasal congestion and sneezing.

## 2016-10-14 ENCOUNTER — Ambulatory Visit (INDEPENDENT_AMBULATORY_CARE_PROVIDER_SITE_OTHER): Payer: Medicare Other | Admitting: Neurology

## 2016-10-14 ENCOUNTER — Encounter: Payer: Self-pay | Admitting: Neurology

## 2016-10-14 ENCOUNTER — Encounter (INDEPENDENT_AMBULATORY_CARE_PROVIDER_SITE_OTHER): Payer: Self-pay

## 2016-10-14 VITALS — BP 153/88 | HR 71 | Ht 65.0 in | Wt 169.2 lb

## 2016-10-14 DIAGNOSIS — F17200 Nicotine dependence, unspecified, uncomplicated: Secondary | ICD-10-CM

## 2016-10-14 DIAGNOSIS — E119 Type 2 diabetes mellitus without complications: Secondary | ICD-10-CM | POA: Insufficient documentation

## 2016-10-14 DIAGNOSIS — I10 Essential (primary) hypertension: Secondary | ICD-10-CM

## 2016-10-14 DIAGNOSIS — E782 Mixed hyperlipidemia: Secondary | ICD-10-CM | POA: Diagnosis not present

## 2016-10-14 DIAGNOSIS — Z8673 Personal history of transient ischemic attack (TIA), and cerebral infarction without residual deficits: Secondary | ICD-10-CM

## 2016-10-14 DIAGNOSIS — E1159 Type 2 diabetes mellitus with other circulatory complications: Secondary | ICD-10-CM

## 2016-10-14 DIAGNOSIS — G4700 Insomnia, unspecified: Secondary | ICD-10-CM

## 2016-10-14 DIAGNOSIS — Z8619 Personal history of other infectious and parasitic diseases: Secondary | ICD-10-CM

## 2016-10-14 DIAGNOSIS — G3184 Mild cognitive impairment, so stated: Secondary | ICD-10-CM

## 2016-10-14 NOTE — Patient Instructions (Addendum)
-   smoking cessation counseling again provided - continue plavix and statin for stroke prevention - good sleep hygiene, decrease drinking at night, encourage drinking during the day.  - encourage more physical and social activity  - BP control, goal 120-140 - check BP and glucose at home and follow up with PCP for stroke risk factor modification - recommend VitB12 supplement. Buy over the counter - follow up as needed.

## 2016-10-14 NOTE — Progress Notes (Signed)
STROKE NEUROLOGY FOLLOW UP NOTE  NAME: Donna Gilmore DOB: [birth year]  REASON FOR VISIT:  Stroke follow up HISTORY FROM: pt and chart  Today we had the pleasure of seeing [name] in follow-up at our Neurology Clinic. Pt was accompanied by no one.   History Summary Donna Gilmore is an 76 y.o. female with know HTN and hyperlipidemia was admitted to [facility] in 07/2012 due to "dizzy, weak and felt light headed. This continued for 2 weeks". She denies vertiginous sensation or room spinning. On admission her BP was 147/69 and most recent was 151/67. She was found to have bilaterally cerebellar small infarct but also high grade stenosis of right VA and BA, occluded left VA. She was put on plavix and high dose lipitor.    11/09/12 (LL): Patient comes in for hospital stroke follow up. She has been doing well, but has had continued intermittent lightheadedness, which she has experienced for the past 15 years or more. She states she is worried that she will have another stroke and she is under a lot of stress right now. Her 26 year old mother is living with her and is on Hospice care, and there is discontent between her and her siblings. She has been tolerating Plavix without side effects and is taking all of her medications as prescribed. She states her BP is normally in the 130s but is elevated in office today, 158/91.   02/16/13 (LL): [name] returns to the office for stroke revisit. Her lightheadedness is much better, she takes Meclizine twice daily. She continues to be under a lot of stress with her mothers illness and her siblings. She has been tolerating Plavix without side effects and is taking all of her medications as prescribed. She does not monitor her BP at home. It is elevated in office today, 154/81.   08/12/13 follow up - Patient has been doing the same. She still stated that she has intermittent lightheadedness but not vertigo. The lightheadedness can be associated with neck  movement but also can happen even she is still. This is no change as before. She is doing better stress wise as her mom passed away. She continues to smoke and she said she tried nicotine patch before but does not work and she does not want try else anymore.  10/22/13 follow up - she was doing the same. Had TCD and carotid doppler which were unremarkable. She again had ED visit for lightheadedness and discharged home.  Interval History During the interval time, patient has been doing well. Continue follow-up with PCP for BP and glucose control. BP today 153/88, however she stated that her blood pressure at home around 140s/70s. Glucose controlled much better, today 112.  On her recent follow-up with PCP, she complains of mild memory loss. She stated that she goes to another room and forgot what she wanted to do. She sometimes misplace things and cannot find later. However, she is pretty functioning and without depression. Checked dementia labs including TSH 2.15 and A1c 6.6. However, RPR positive titer 1:2, and T pallidum Abs tested positive. She admitted that she had syphilis in the past and was treated many many years ago. At her last with with me 3 years ago, she had low normal B-12 levels, was advised to take B-12 supplement, however she did not take B-12 at home. Continues smoking, on nicotine patch. Patient does not sleep well, partially due to her alcohol use at night.  REVIEW OF SYSTEMS: Full 14  system review of systems performed and notable only for those listed below and in HPI above, all others are negative:  Constitutional:  Activity change, appetite change Cardiovascular:  Ear/Nose/Throat:  Hearing loss, runny nose Skin: Itching, moles Eyes:  Eye itching Respiratory:  SOB Gastroitestinal:  Swallow abdomen, constipation, rectal pain Genitourinary:  Hematology/Lymphatic:   Endocrine:  Musculoskeletal:  Joint pain, back pain Allergy/Immunology:   Neurological:  Speech  difficulty Psychiatric:  Sleep: Frequent waking   The following represents the patient's updated allergies and side effects list: No Known Allergies  Labs since last visit of relevance include the following: Results for orders placed or performed during the hospital encounter of 04/07/15  Urine culture  Result Value Ref Range   Specimen Description URINE, CLEAN CATCH    Special Requests NONE    Culture MULTIPLE SPECIES PRESENT, SUGGEST RECOLLECTION    Report Status 04/09/2015 FINAL   POCT urinalysis dip (device)  Result Value Ref Range   Glucose, UA NEGATIVE NEGATIVE mg/dL   Bilirubin Urine NEGATIVE NEGATIVE   Ketones, ur NEGATIVE NEGATIVE mg/dL   Specific Gravity, Urine 1.020 1.005 - 1.030   Hgb urine dipstick TRACE (A) NEGATIVE   pH 7.5 5.0 - 8.0   Protein, ur NEGATIVE NEGATIVE mg/dL   Urobilinogen, UA 0.2 0.0 - 1.0 mg/dL   Nitrite NEGATIVE NEGATIVE   Leukocytes, UA NEGATIVE NEGATIVE  I-STAT, chem 8  Result Value Ref Range   Sodium 143 135 - 145 mmol/L   Potassium 4.0 3.5 - 5.1 mmol/L   Chloride 105 101 - 111 mmol/L   BUN 8 6 - 20 mg/dL   Creatinine, Ser 0.90 0.44 - 1.00 mg/dL   Glucose, Bld 115 (H) 65 - 99 mg/dL   Calcium, Ion 1.22 1.13 - 1.30 mmol/L   TCO2 28 0 - 100 mmol/L   Hemoglobin 14.6 12.0 - 15.0 g/dL   HCT 43.0 36.0 - 46.0 %    The neurologically relevant items on the patient's problem list were reviewed on today's visit.  Neurologic Examination  A problem focused neurological exam (12 or more points of the single system neurologic examination, vital signs counts as 1 point, cranial nerves count for 8 points) was performed.  Blood pressure (!) 153/88, pulse 71, height 5\' 5"  (1.651 m), weight 169 lb 3.2 oz (76.7 kg).  General - Well nourished, well developed, in no apparent distress.  Ophthalmologic - Sharp disc margins OU.  Cardiovascular - Regular rate and rhythm with no murmur.  Mental Status -  Level of arousal and orientation to time, place,  and person were intact. Language including expression, naming, repetition, comprehension was assessed and found intact.  Cranial Nerves II - XII - II - Vision intact OU. III, IV, VI - Extraocular movements intact. V - Facial sensation intact bilaterally. VII - Facial movement intact bilaterally. VIII - Hearing & vestibular intact bilaterally except hard of hearing. X - Palate elevates symmetrically. XI - Chin turning & shoulder shrug intact bilaterally. XII - Tongue protrusion intact.  Motor Strength - The patient's strength was normal in all extremities and pronator drift was absent.  Bulk was normal and fasciculations were absent.   Motor Tone - Muscle tone was assessed at the neck and appendages and was normal.  Reflexes - The patient's reflexes were normal in all extremities and she had no pathological reflexes.  Sensory - Light touch, temperature/pinprick, vibration and proprioception, and Romberg testing were assessed and were normal.    Coordination - The patient had normal  movements in the hands and feet with no ataxia or dysmetria.  Tremor was absent.  Gait and Station - The patient's transfers, posture, gait, station, and turns were observed as normal.  Data reviewed: I personally reviewed the images and agree with the radiology interpretations.  MRA head 08/27/12 1.  Severe vertebral basilar disease without significant flow signal identified in the proximal basilar artery. 2.  High-grade distal right vertebral artery stenosis. 3.  Occlusion of the nondominant left vertebral artery. 4.  Moderate small vessel disease within the anterior circulation. MRA neck 08/27/12 1.  High-grade stenosis of the right vertebral artery origin, the dominant vessel. 2.  High-grade stenosis of the distal right vertebral artery, just proximal to the PICA. 3.  Occlusion of the left vertebral artery with faint reconstitution likely by musculoskeletal branches. 4.  Segmental stenoses of the  residual left vertebral artery without to signal beyond the PICA.  This may represent a congenitally small vessel which terminates at the PICA versus a distal occlusion. 5.  Tortuous common carotid arteries and internal carotid arteries without significant stenoses in the anterior circulation. 6.  Fetal type posterior cerebral arteries bilaterally. 7. Moderate to severe stenoses within the proximal hypoplastic basilar artery. MRI brain 08/25/12 Small areas of acute and chronic infarct in the cerebellum bilaterally. MRI brain 09/30/13 1. No acute intracranial abnormality; although this patient has had  prior small cerebellar infarcts, no acute infarct is detected today.  2. Stable and probably inconsequential small intra sellar pituitary  cyst.  3. Incidentally noted superficial right parotid gland 2 cm nodule,  with MRI signal suggesting hypercellularity. Recommend nonemergent  ENT follow-up.  2D echo -  - Left ventricle: The cavity size was normal. There was mild concentric hypertrophy. Systolic function was normal. The   estimated ejection fraction was in the range of 60% to   65%. Wall motion was normal; there were no regional wall   motion abnormalities. Doppler parameters are consistent   with abnormal left ventricular relaxation (grade 1   diastolic dysfunction). - Mitral valve: Mildly calcified annulus. Mildly calcified   leaflets . Mild regurgitation. TCD - 11/2012 Consistent with mild stenosis of right VA. Mildly diminished CBFV in the mid BA of unclear clinical and hemodynamical significance. CUS - 09/15/13 Negative for hemodynamically significant stenosis involving extracranial carotid arteries bilaterally. There was normal antegrade blood flow seen in the bilateral VAs.   Component     Latest Ref Rng 08/26/2012  Cholesterol     0 - 200 mg/dL 271 (H)  Triglycerides     <150 mg/dL 301 (H)  HDL     >39 mg/dL 31 (L)  Total CHOL/HDL Ratio      8.7  VLDL     0 - 40 mg/dL 60  (H)  LDL (calc)     0 - 99 mg/dL 180 (H)  Hemoglobin A1C     <5.7 % 6.0 (H)  Mean Plasma Glucose     <117 mg/dL 126 (H)  TSH     0.350 - 4.500 uIU/mL 3.501   Component     Latest Ref Rng 08/13/2013  Cholesterol, Total     100 - 199 mg/dL 175  Triglycerides     0 - 149 mg/dL 190 (H)  HDL     >39 mg/dL 41  VLDL Cholesterol Cal     5 - 40 mg/dL 38  LDL (calc)     0 - 99 mg/dL 96  Total CHOL/HDL Ratio  0.0 - 4.4 ratio units 4.3  TSH     0.450 - 4.500 uIU/mL 1.990  Free T4     0.82 - 1.77 ng/dL 0.95  Hemoglobin A1C     4.8 - 5.6 % 6.7 (H)  Estimated average glucose      146  Vitamin B-12     211 - 946 pg/mL 317   09/25/16 TSH 2.15, A1c 6.6, LDL 132, TG to 289, creatinine 1.02, hemoglobin 12.2, WBC 6.8, platelet 251, RPR positive 1:2, T pallidum Abs positive   Assessment: As you may recall, she is a 76 y.o.  African American female with a diagnosis of bilaterally cerebellar stroke in 07/2012. Since then, she had no recurrent stroke but continued to have intermittent lightheadedness which has been going on for the last 20 years. Her posterior vessels are highly stenosesd or occluded, but CUS repeat showed antegrade flow bilateral VAs. Recommended to avoid hypotension. he continue to smoking. currently on nicotin patch. HTN and DM in good control. Reported mild cognitive impairment, dementia labs found to have positive RPR and T. Pallidum Abs, but RPR titer was low, consistent with previously treated syphilis, which patient confirmed. No further action needed at this time. However, patient does have low normal B-12 levels in the past, recommend B-12 supplement. Parent current cognitive impairment very mild, recommend smoking cessation, physical activity and sleep hygiene.  Plan:  - smoking cessation counseling again provided - continue plavix and statin for stroke prevention - good sleep hygiene, decrease drinking at night, avoid dehydration during the day.  - encourage more  physical and social activity  - BP control, goal 120-140 - check BP and glucose at home and follow up with PCP for stroke risk factor modification - recommend VitB12 supplement. Buy over the counter - follow up as needed.   I spent more than 25 minutes of face to face time with the patient. Greater than 50% of time was spent in counseling and coordination of care. We discussed cognitive impairment, B12 supplement, interpretation of positive RPR, sleep hygiene.   No orders of the defined types were placed in this encounter.  Patient Instructions  - smoking cessation counseling again provided - continue plavix and statin for stroke prevention - good sleep hygiene, decrease drinking at night, encourage drinking during the day.  - encourage more physical and social activity  - BP control, goal 120-140 - check BP and glucose at home and follow up with PCP for stroke risk factor modification - recommend VitB12 supplement. Buy over the counter - follow up as needed.    Rosalin Hawking, MD PhD [facility] [address] [place], [place] [address] [phone]

## 2016-11-05 ENCOUNTER — Ambulatory Visit (INDEPENDENT_AMBULATORY_CARE_PROVIDER_SITE_OTHER): Payer: Medicare Other | Admitting: Allergy and Immunology

## 2016-11-05 ENCOUNTER — Encounter: Payer: Self-pay | Admitting: Allergy and Immunology

## 2016-11-05 VITALS — BP 128/76 | HR 86 | Temp 98.4°F | Resp 17 | Ht 64.0 in | Wt 170.2 lb

## 2016-11-05 DIAGNOSIS — R51 Headache: Secondary | ICD-10-CM | POA: Diagnosis not present

## 2016-11-05 DIAGNOSIS — F17210 Nicotine dependence, cigarettes, uncomplicated: Secondary | ICD-10-CM

## 2016-11-05 DIAGNOSIS — H1010 Acute atopic conjunctivitis, unspecified eye: Secondary | ICD-10-CM

## 2016-11-05 DIAGNOSIS — R519 Headache, unspecified: Secondary | ICD-10-CM

## 2016-11-05 DIAGNOSIS — J3089 Other allergic rhinitis: Secondary | ICD-10-CM

## 2016-11-05 MED ORDER — MONTELUKAST SODIUM 10 MG PO TABS: 10.0000 mg | ORAL_TABLET | Freq: Every day | ORAL | 5 refills | Status: DC

## 2016-11-05 MED ORDER — AZELASTINE-FLUTICASONE 137-50 MCG/ACT NA SUSP: NASAL | 5 refills | Status: DC

## 2016-11-05 NOTE — Patient Instructions (Addendum)
  1. Allergen avoidance measures  2. Use nicotine substitutes to replace tobacco smoke exposure  3. Slowly taper all caffeine to help with headaches  4. Treat and prevent inflammation:   A. Dymista - one spray each nostril two times per day  B. Montelukast 10mg  - one tablet one time per day  5. If needed:   A. loratadine 10 mg or Xyzal 5 mg tablet 1 time per day  B. [place] eyedrops twice a day  C. ProAir HFA 2 puffs every 4-6 hours  6. Immunotherapy?  7. Return to clinic in 4 weeks or earlier if problem  8. Obtain fall flu vaccine

## 2016-11-05 NOTE — Progress Notes (Signed)
Dear Dr. Jeanie Cooks,  Thank you for referring Donna Gilmore to the [facility] on 11/05/2016.   Below is a summation of this patient's evaluation and recommendations.  Thank you for your referral. I will keep you informed about this patient's response to treatment.   If you have any questions please do not hesitate to contact me.   Sincerely,  Jiles Prows, MD Allergy / Immunology [facility]   ______________________________________________________________________    NEW PATIENT NOTE  Referring Provider: Nolene Ebbs, MD Primary Provider: Glendale Chard, MD Date of office visit: 11/05/2016    Subjective:   Chief Complaint:  Donna Gilmore (DOB: 31-Jul-1940) is a 76 y.o. female who presents to the clinic on 11/05/2016 with a chief complaint of Nasal Congestion .     HPI: Donna Gilmore presents to this clinic in evaluation of several different issues. She has a hearing deficit and communication was difficult as a result of this deficit.  First, she has nasal congestion and sneezing and itchy red watery eyes. She has a 10 minute sneezing episode upon awakening every morning. She feels as though her ears are full. Provoking factors for her symptoms include exposure to grass and perfumes and sometimes heat. The symptoms continue even though she consistently uses Flonase and Alaway eyedrops.  Second, she has a history of developing intermittent shortness of breath and some coughing. She has been given an inhaler in the past but she is not really sure when to use this inhaler. She smokes tobacco somewhere around a half a pack a day or less since she has been doing this for 50 years.  Third, she has headaches in her frontal region which she describes as "strange" without any associated neurological symptoms or scotoma occurring on a daily basis for which she will take Tylenol which lasts 30-120  minutes. She does drink 4 cups of coffee a day and has tea daily and Pepsi daily and drinks alcohol daily.  Fourth, she has a history of positional vertigo for which she uses meclizine intermittently.  Past Medical History:  Diagnosis Date  . Benign paroxysmal positional vertigo 08/26/2012  . CVA (cerebral infarction) 08/25/2012  . Dyslipidemia 08/25/2012  . Essential hypertension, benign 08/25/2012  . Hypertension   . Stroke [facility])     History reviewed. No pertinent surgical history.  Allergies as of 11/05/2016   No Known Allergies     Medication List      atorvastatin 40 MG tablet Commonly known as:  LIPITOR Take 1 tablet (40 mg total) by mouth daily.   clopidogrel 75 MG tablet Commonly known as:  PLAVIX Take 1 tablet (75 mg total) by mouth daily with breakfast.   fluticasone 50 MCG/ACT nasal spray Commonly known as:  FLONASE Place 1 spray into both nostrils daily.   Ginkgo Biloba 500 MG Caps Take by mouth 2 times daily at 12 noon and 4 pm.   levocetirizine 5 MG tablet Commonly known as:  XYZAL Take 5 mg by mouth every evening.   lisinopril 40 MG tablet Commonly known as:  PRINIVIL,ZESTRIL Take 1 tablet (40 mg total) by mouth daily.   meclizine 25 MG tablet Commonly known as:  ANTIVERT Take 1 tablet (25 mg total) by mouth 3 (three) times daily as needed for dizziness.   OVER THE COUNTER MEDICATION   PROAIR HFA 108 (90 Base) MCG/ACT inhaler Generic drug:  albuterol Inhale 2 puffs  into the lungs every 6 (six) hours as needed for wheezing or shortness of breath.   vitamin C 500 MG tablet Commonly known as:  ASCORBIC ACID Take 500 mg by mouth daily.   VITAMIN D PO Take by mouth.       Review of systems negative except as noted in HPI / PMHx or noted below:  Review of Systems  Constitutional: Negative.   HENT: Negative.   Eyes: Negative.   Respiratory: Negative.   Cardiovascular: Negative.   Gastrointestinal: Negative.   Genitourinary: Negative.     Musculoskeletal: Negative.   Skin: Negative.   Neurological: Negative.   Endo/Heme/Allergies: Negative.   Psychiatric/Behavioral: Negative.     Family History  Problem Relation Age of Onset  . Kidney failure Mother   . Diabetes Mother   . Alzheimer's disease Mother   . Alzheimer's disease Father   . Diabetes Maternal Grandmother   . Diabetes Maternal Grandfather   . Alzheimer's disease Paternal Grandmother   . Alzheimer's disease Paternal Grandfather     Social History   Social History  . Marital status: Divorced    Spouse name: N/A  . Number of children: 4  . Years of education: N/A   Occupational History  . retired    Social History Main Topics  . Smoking status: Current Every Day Smoker    Packs/day: 0.30    Years: 30.00    Types: Cigarettes  . Smokeless tobacco: Never Used  . Alcohol use Yes  . Drug use: No  . Sexual activity: Yes   Other Topics Concern  . Not on file   Social History Narrative  . No narrative on file    Environmental and Social history  Lives in a house with a dry environment, a dog located inside the household, carpeting in the bedroom, no plastic on the bed, no plastic on the pillow, and no smokers located inside the household.  Objective:   Vitals:   11/05/16 1343  BP: 128/76  Pulse: 86  Resp: 17  Temp: 98.4 F (36.9 C)  SpO2: 93%   Height: 5\' 4"  (162.6 cm) Weight: 170 lb 3.2 oz (77.2 kg)  Physical Exam  Constitutional: She is well-developed, well-nourished, and in no distress.  Rubbing of nose, throat clearing, very hard of hearing  HENT:  Head: Normocephalic. Head is without right periorbital erythema and without left periorbital erythema.  Right Ear: Tympanic membrane, external ear and ear canal normal.  Left Ear: Tympanic membrane, external ear and ear canal normal.  Nose: Nose normal. No mucosal edema or rhinorrhea.  Mouth/Throat: Uvula is midline, oropharynx is clear and moist and mucous membranes are normal. No  oropharyngeal exudate.  Eyes: Pupils are equal, round, and reactive to light. Conjunctivae and lids are normal.  Neck: Trachea normal. No tracheal tenderness present. No tracheal deviation present. No thyromegaly present.  Cardiovascular: Normal rate, regular rhythm, S1 normal, S2 normal and normal heart sounds.   No murmur heard. Pulmonary/Chest: Effort normal and breath sounds normal. No stridor. No tachypnea. No respiratory distress. She has no wheezes. She has no rales. She exhibits no tenderness.  Abdominal: Soft. She exhibits no distension and no mass. There is no hepatosplenomegaly. There is no tenderness. There is no rebound and no guarding.  Musculoskeletal: She exhibits no edema or tenderness.  Lymphadenopathy:       Head (right side): No tonsillar adenopathy present.       Head (left side): No tonsillar adenopathy present.    She  has no cervical adenopathy.    She has no axillary adenopathy.  Neurological: She is alert. Gait normal.  Skin: No rash noted. She is not diaphoretic. No erythema. No pallor. Nails show no clubbing.  Psychiatric: Mood and affect normal.    Diagnostics: Allergy skin tests were not performed secondary to the recent use of a antihistamine.   Assessment and Plan:    1. Other allergic rhinitis   2. Seasonal allergic conjunctivitis   3. Headache disorder   4. Heavy tobacco smoker >10 cigarettes per day     1. Allergen avoidance measures?  2. Use nicotine substitutes to replace tobacco smoke exposure  3. Slowly taper all caffeine to help with headaches  4. Treat and prevent inflammation:   A. Dymista - one spray each nostril two times per day  B. Montelukast 10mg  - one tablet one time per day  5. If needed:   A. loratadine 10 mg or Xyzal 5mg  tablet 1 time per day  B. Alaway eyedrops twice a day  C. ProAir HFA 2 puffs every 4-6 hours  6. Immunotherapy?  7. Return to clinic in 4 weeks or earlier if problem  8. Obtain fall flu vaccine  We  could not test Donna Gilmore for aero allergen hypersensitivity today because she used an antihistamine this morning. We will give her a plan to address inflammation of her airway as noted above and I made some suggestions about her tobacco hobby and her headache therapy. I suspect that most of her headaches are probably tied up with her extensive caffeine use. I will see her back in this clinic in 4 weeks or earlier negative determination about whether or not she will require further evaluation and treatment pending her response to this approach.  Jiles Prows, MD Allergy / Immunology [facility]

## 2016-12-03 ENCOUNTER — Ambulatory Visit: Payer: Medicare Other | Admitting: Allergy and Immunology

## 2016-12-03 VITALS — BP 128/88 | HR 80 | Resp 18

## 2016-12-03 DIAGNOSIS — R51 Headache: Secondary | ICD-10-CM

## 2016-12-03 DIAGNOSIS — J449 Chronic obstructive pulmonary disease, unspecified: Secondary | ICD-10-CM

## 2016-12-03 DIAGNOSIS — J3089 Other allergic rhinitis: Secondary | ICD-10-CM | POA: Diagnosis not present

## 2016-12-03 DIAGNOSIS — H1010 Acute atopic conjunctivitis, unspecified eye: Secondary | ICD-10-CM

## 2016-12-03 DIAGNOSIS — F17210 Nicotine dependence, cigarettes, uncomplicated: Secondary | ICD-10-CM

## 2016-12-03 DIAGNOSIS — R519 Headache, unspecified: Secondary | ICD-10-CM

## 2016-12-03 MED ORDER — IPRATROPIUM BROMIDE 0.06 % NA SOLN: 2.0000 | Freq: Three times a day (TID) | NASAL | 5 refills | Status: DC

## 2016-12-03 NOTE — Progress Notes (Signed)
Follow-up Note  Referring Provider: Glendale Chard, MD Primary Provider: Glendale Chard, MD Date of Office Visit: 12/03/2016  Subjective:   Donna Gilmore (DOB: 22-Oct-1940) is a 76 y.o. female who returns to the [facility] on 12/03/2016 in re-evaluation of the following:  HPI: Donna Gilmore presents to this clinic in evaluation of her allergic rhinitis, allergic conjunctivitis, history of asthma in the context of prolonged tobacco use, and chronic cephalgia.  I last saw her in this clinic 05 November 2016 for her initial evaluation.  Her nose is about the same and she still has lots of sneezing and she did not think that the Dymista helped her very much and she had side effects from using montelukast with the development of GI upset.  One of her major complaints is that she has a very runny nose in the morning.  Fortunately, her headaches are much better.  She has eliminated all caffeine consumption and is now drinking decaf coffee and does not have tea or Pepsi at this point.  She still continues to drink alcohol daily using homemade wine.  She has not been having any issues with shortness of breath or coughing and she has not had to use a short acting bronchodilator.  She continues to smoke greater than a half a pack a day at this point.  She did not try any nicotine substitutes.  Allergies as of 12/03/2016   No Known Allergies     Medication List      atorvastatin 40 MG tablet Commonly known as:  LIPITOR Take 1 tablet (40 mg total) by mouth daily.   Azelastine-Fluticasone 137-50 MCG/ACT Susp Commonly known as:  DYMISTA Use one spray in each nostril twice a day as directed.   clopidogrel 75 MG tablet Commonly known as:  PLAVIX Take 1 tablet (75 mg total) by mouth daily with breakfast.   fluticasone 50 MCG/ACT nasal spray Commonly known as:  FLONASE Place 1 spray into both nostrils daily.   Ginkgo Biloba 500 MG Caps Take by mouth 2 times daily at 12 noon and 4  pm.   levocetirizine 5 MG tablet Commonly known as:  XYZAL Take 5 mg by mouth every evening.   lisinopril 40 MG tablet Commonly known as:  PRINIVIL,ZESTRIL Take 1 tablet (40 mg total) by mouth daily.   meclizine 25 MG tablet Commonly known as:  ANTIVERT Take 1 tablet (25 mg total) by mouth 3 (three) times daily as needed for dizziness.   montelukast 10 MG tablet Commonly known as:  SINGULAIR Take 1 tablet (10 mg total) by mouth at bedtime.   OVER THE COUNTER MEDICATION   PROAIR HFA 108 (90 Base) MCG/ACT inhaler Generic drug:  albuterol Inhale 2 puffs into the lungs every 6 (six) hours as needed for wheezing or shortness of breath.   vitamin C 500 MG tablet Commonly known as:  ASCORBIC ACID Take 500 mg by mouth daily.   VITAMIN D PO Take by mouth.       Past Medical History:  Diagnosis Date  . Benign paroxysmal positional vertigo 08/26/2012  . CVA (cerebral infarction) 08/25/2012  . Dyslipidemia 08/25/2012  . Essential hypertension, benign 08/25/2012  . Hypertension   . Stroke [facility])     No past surgical history on file.  Review of systems negative except as noted in HPI / PMHx or noted below:  Review of Systems  Constitutional: Negative.   HENT: Negative.   Eyes: Negative.   Respiratory: Negative.  Cardiovascular: Negative.   Gastrointestinal: Negative.   Genitourinary: Negative.   Musculoskeletal: Negative.   Skin: Negative.   Neurological: Negative.   Endo/Heme/Allergies: Negative.   Psychiatric/Behavioral: Negative.      Objective:   Vitals:   12/03/16 1215  BP: 128/88  Pulse: 80  Resp: 18          Physical Exam  Constitutional: She is well-developed, well-nourished, and in no distress.  Nasal voice  HENT:  Head: Normocephalic.  Right Ear: Tympanic membrane, external ear and ear canal normal.  Left Ear: Tympanic membrane, external ear and ear canal normal.  Nose: Nose normal. No mucosal edema or rhinorrhea.  Mouth/Throat: Uvula is  midline, oropharynx is clear and moist and mucous membranes are normal. No oropharyngeal exudate.  Bilateral hearing aid  Eyes: Conjunctivae are normal.  Neck: Trachea normal. No tracheal tenderness present. No tracheal deviation present. No thyromegaly present.  Cardiovascular: Normal rate, regular rhythm, S1 normal, S2 normal and normal heart sounds.  No murmur heard. Pulmonary/Chest: Breath sounds normal. No stridor. No respiratory distress. She has no wheezes. She has no rales.  Musculoskeletal: She exhibits no edema.  Lymphadenopathy:       Head (right side): No tonsillar adenopathy present.       Head (left side): No tonsillar adenopathy present.    She has no cervical adenopathy.  Neurological: She is alert. Gait normal.  Skin: No rash noted. She is not diaphoretic. No erythema. Nails show no clubbing.  Psychiatric: Mood and affect normal.    Diagnostics: none  Assessment and Plan:   1. Other allergic rhinitis   2. Seasonal allergic conjunctivitis   3. Headache disorder   4. Heavy tobacco smoker >10 cigarettes per day     1.  Blood - area 2 aero allergen profile  2. Use nicotine substitutes to replace tobacco smoke exposure  3.  Continue to taper all caffeine to help with headaches  4. Treat and prevent inflammation:   A. Flonase - one spray each nostril one time per day  5. To dry up nose in morning use ipratropium 0.06% 2 sprays each nostril.  May repeat up to 3 times per day if needed  5. If needed:   A. loratadine 10 mg or Xyzal 5 mg tablet 1 time per day  B. Alaway eyedrops twice a day  C. ProAir HFA 2 puffs every 4-6 hours  6. Immunotherapy?  7. Return to clinic in Christmas 2018 or earlier if problem  We will try Donna Gilmore on a somewhat different plan at this point by having her use nasal ipratropium to dry up her nose in the morning and throughout the day if needed and I will check her aero allergen hypersensitivity profile to see if she is a candidate for  allergen avoidance measures and/or immunotherapy.  Her headaches are much better and that is probably a result of tapering down her caffeine and I have encouraged her to remain away from caffeine at this point.  Of course, I also recommended that she stop smoking tobacco but after 50 years of this hobby I do not think I was very successful in convincing her to find a substitute.  We will contact her with the results of her blood tests once they are available for review.  Allena Katz, MD Allergy / Immunology [place]

## 2016-12-03 NOTE — Patient Instructions (Addendum)
  1.  Blood - area 2 aero allergen profile  2. Use nicotine substitutes to replace tobacco smoke exposure  3.  Continue to taper all caffeine to help with headaches  4. Treat and prevent inflammation:   A. Flonase - one spray each nostril one time per day  5. To dry up nose in morning use ipratropium 0.06% 2 sprays each nostril.  May repeat up to 3 times per day if needed  5. If needed:   A. loratadine 10 mg or Xyzal 5 mg tablet 1 time per day  B. Alaway eyedrops twice a day  C. ProAir HFA 2 puffs every 4-6 hours  6. Immunotherapy?  7. Return to clinic in Christmas 2018 or earlier if problem

## 2016-12-04 ENCOUNTER — Encounter: Payer: Self-pay | Admitting: Allergy and Immunology

## 2016-12-10 LAB — IGE+ALLERGENS ZONE 3(27)
Alternaria Alternata IgE: <0.1 kU/L
Aspergillus Fumigatus IgE: <0.1 kU/L
Bahia Grass IgE: <0.1 kU/L
Bermuda Grass IgE: <0.1 kU/L
D Farinae IgE: 0.38 kU/L — AB
D Pteronyssinus IgE: 0.33 kU/L — AB
Dog Dander IgE: <0.1 kU/L
IgE (Immunoglobulin E), Serum: 1027 IU/mL — ABNORMAL HIGH (ref 0–100)
Johnson Grass IgE: 0.1 kU/L — AB

## 2016-12-18 ENCOUNTER — Other Ambulatory Visit: Payer: Self-pay

## 2016-12-18 DIAGNOSIS — J3089 Other allergic rhinitis: Secondary | ICD-10-CM

## 2017-01-10 DIAGNOSIS — H903 Sensorineural hearing loss, bilateral: Secondary | ICD-10-CM | POA: Insufficient documentation

## 2017-01-10 DIAGNOSIS — K118 Other diseases of salivary glands: Secondary | ICD-10-CM | POA: Insufficient documentation

## 2017-03-21 ENCOUNTER — Other Ambulatory Visit: Payer: Self-pay | Admitting: Nurse Practitioner

## 2017-03-21 DIAGNOSIS — R42 Dizziness and giddiness: Secondary | ICD-10-CM

## 2017-04-01 ENCOUNTER — Ambulatory Visit
Admission: RE | Admit: 2017-04-01 | Discharge: 2017-04-01 | Disposition: A | Discharge status: Home / Self Care | Payer: Medicare Other | Source: Ambulatory Visit | Attending: Nurse Practitioner | Admitting: Nurse Practitioner

## 2017-04-01 DIAGNOSIS — R42 Dizziness and giddiness: Secondary | ICD-10-CM

## 2017-05-27 ENCOUNTER — Other Ambulatory Visit: Payer: Self-pay | Admitting: Nurse Practitioner

## 2017-05-27 ENCOUNTER — Ambulatory Visit
Admission: RE | Admit: 2017-05-27 | Discharge: 2017-05-27 | Disposition: A | Discharge status: Home / Self Care | Payer: Medicare Other | Source: Ambulatory Visit | Attending: Nurse Practitioner | Admitting: Nurse Practitioner

## 2017-05-27 DIAGNOSIS — K5900 Constipation, unspecified: Secondary | ICD-10-CM

## 2017-06-03 ENCOUNTER — Encounter: Payer: Self-pay | Admitting: Internal Medicine

## 2017-06-11 ENCOUNTER — Ambulatory Visit
Admission: RE | Admit: 2017-06-11 | Discharge: 2017-06-11 | Disposition: A | Discharge status: Home / Self Care | Payer: Medicare Other | Source: Ambulatory Visit | Attending: Internal Medicine | Admitting: Internal Medicine

## 2017-06-11 ENCOUNTER — Other Ambulatory Visit: Payer: Self-pay | Admitting: Internal Medicine

## 2017-06-11 DIAGNOSIS — M545 Low back pain, unspecified: Secondary | ICD-10-CM

## 2017-06-11 DIAGNOSIS — M25511 Pain in right shoulder: Secondary | ICD-10-CM

## 2017-06-13 ENCOUNTER — Other Ambulatory Visit: Payer: Self-pay | Admitting: Internal Medicine

## 2017-06-13 DIAGNOSIS — Z1231 Encounter for screening mammogram for malignant neoplasm of breast: Secondary | ICD-10-CM

## 2017-07-04 ENCOUNTER — Ambulatory Visit
Admission: RE | Admit: 2017-07-04 | Discharge: 2017-07-04 | Disposition: A | Discharge status: Home / Self Care | Payer: Medicare Other | Source: Ambulatory Visit | Attending: Internal Medicine | Admitting: Internal Medicine

## 2017-07-04 DIAGNOSIS — Z1231 Encounter for screening mammogram for malignant neoplasm of breast: Secondary | ICD-10-CM

## 2017-07-23 ENCOUNTER — Ambulatory Visit: Payer: Medicare Other | Admitting: Internal Medicine

## 2017-07-23 ENCOUNTER — Encounter: Payer: Self-pay | Admitting: Internal Medicine

## 2017-07-23 VITALS — BP 134/82 | HR 80 | Ht 64.0 in | Wt 180.0 lb

## 2017-07-23 DIAGNOSIS — Z800 Family history of malignant neoplasm of digestive organs: Secondary | ICD-10-CM

## 2017-07-23 DIAGNOSIS — R195 Other fecal abnormalities: Secondary | ICD-10-CM

## 2017-07-23 MED ORDER — NA SULFATE-K SULFATE-MG SULF 17.5-3.13-1.6 GM/177ML PO SOLN: 1.0000 | Freq: Once | ORAL | 0 refills | Status: AC

## 2017-07-23 NOTE — Patient Instructions (Signed)

## 2017-07-24 ENCOUNTER — Encounter: Payer: Self-pay | Admitting: Internal Medicine

## 2017-07-24 NOTE — Progress Notes (Signed)
HISTORY OF PRESENT ILLNESS:  Donna Gilmore is a 77 y.o. female with multiple significant medical problems including hypertension, hyperlipidemia, and prior stroke for which she is on chronic Plavix therapy. Also history of vertigo. Patient is new to this practice and referred by [name] FNP at [facility] with chief complaint of altered bowel habits, perirectal irritation, and Hemoccult-positive stool. The patient is accompanied by her son. Patient tells me that she typically has normal and regular bowel habits. However earlier that she she had difficulties with constipation and diarrhea. She was evaluated by her primary care provider. At that time she was found to have perirectal rash which was treated with nystatin and has resolved. As well, her bowel habits have returned to their baseline. The patient tells me that she has a family history of colon cancer in her brother and had been undergoing colonoscopies in [place] area about every 5 years. She is a suboptimal historian. She tells me that she had a colonoscopy in [place] 10 or more years ago. No details. She is not sure if she had polyps. She is not sure where the exam was performed or who performed the exam. In any event, at the time of her primary care evaluation in March 2019 she was noted to have Hemoccult-positive stool. The patient denies melena or hematochezia. No weight loss. As a matter of fact she complains about truncal obesity which has worsened over time, particularly after she quit smoking. She did undergo x-rays in the form of abdominal films 05/27/2017. These were unremarkable.review of laboratory shows that her last hemoglobin in 2016 was 12.7. Patient also has innumerable non-GI complaints as listed below in the review of systems.  REVIEW OF SYSTEMS:  All non-GI ROS negative unless otherwise stated in the history of present illness except for size and allergy, back pain, fatigue,  difficulty bending, dizziness, sleeping problems, itching, excessive urination, muscle cramps  Past Medical History:  Diagnosis Date  . Benign paroxysmal positional vertigo 08/26/2012  . CVA (cerebral infarction) 08/25/2012  . Dyslipidemia 08/25/2012  . Essential hypertension, benign 08/25/2012  . Hypertension   . Stroke [facility])     History reviewed. No pertinent surgical history.  Social History Donna Gilmore  reports that she has been smoking cigarettes.  She has a 9.00 pack-year smoking history. She has never used smokeless tobacco. She reports that she drinks alcohol. She reports that she does not use drugs.  family history includes Alzheimer's disease in her father, mother, paternal grandfather, and paternal grandmother; Diabetes in her maternal grandfather, maternal grandmother, and mother; Kidney failure in her mother; Prostate cancer in her father and paternal uncle.  No Known Allergies     PHYSICAL EXAMINATION:  Vital signs: BP 134/82   Pulse 80   Ht 5\' 4"  (1.626 m)   Wt 180 lb (81.6 kg)   BMI 30.90 kg/m   Constitutional: generally well-appearing, no acute distress Psychiatric: alert and oriented x3, cooperative Eyes: extraocular movements intact, anicteric, conjunctiva pink Mouth: oral pharynx moist, no lesions Neck: supple no lymphadenopathy Cardiovascular: heart regular rate and rhythm, no murmur Lungs: clear to auscultation bilaterally Abdomen: soft, nontender, nondistended, no obvious ascites, no peritoneal signs, normal bowel sounds, no organomegaly Rectal:deferred until colonoscopy Extremities: no lower extremity edema bilaterally Skin: no lesions on visible extremities Neuro: No focal deficits. Cranial nerves intact  ASSESSMENT:  #1. Change in bowel habits. Short-term issue. Now resolved. Etiology unclear. #2. Perirectal fungal rash. Resolved with nystatin #3. Hemoccult-positive stool  of uncertain clinical significance #4. Family history of colon cancer  in first-degree relative #5. History of stroke remotely on Plavix   PLAN:  #1. Daily fiber supplementation with Metamucil or equivalent #2. We discussed workup of her Hemoccult-positive stool. We discussed colonoscopy, which she was requesting. We also discussed colonoscopy in light of her age and comorbidities and that she is HIGH RISK. She was still interested. We also discussed the salient issues regarding continuing or discontinuing Plavix therapy. She understood. To this end we will perform colonoscopy to evaluate her Hemoccult-positive stool. This is a patient with first-degree relative having had colon cancer. I wants to keep her ON Plavix to reduce any risk of interval stroke. She is agreeable.The nature of the procedure, as well as the risks, benefits, and alternatives were carefully and thoroughly reviewed with the patient. Ample time for discussion and questions allowed. The patient understood, was satisfied, and agreed to proceed.  A copy of this dictation has been sent to Ms. Laurance Flatten

## 2017-07-29 ENCOUNTER — Encounter: Payer: Self-pay | Admitting: Internal Medicine

## 2017-08-05 ENCOUNTER — Encounter: Payer: Self-pay | Admitting: Internal Medicine

## 2017-08-05 ENCOUNTER — Ambulatory Visit (AMBULATORY_SURGERY_CENTER): Payer: Medicare Other | Admitting: Internal Medicine

## 2017-08-05 VITALS — BP 134/65 | HR 78 | Temp 97.1°F | Resp 21 | Ht 64.0 in | Wt 180.0 lb

## 2017-08-05 DIAGNOSIS — D122 Benign neoplasm of ascending colon: Secondary | ICD-10-CM | POA: Diagnosis not present

## 2017-08-05 DIAGNOSIS — R195 Other fecal abnormalities: Secondary | ICD-10-CM | POA: Diagnosis present

## 2017-08-05 MED ORDER — SODIUM CHLORIDE 0.9 % IV SOLN: 500.0000 mL | Freq: Once | INTRAVENOUS | Status: AC

## 2017-08-05 NOTE — Op Note (Signed)
[place] Patient Name: Donna Gilmore Procedure Date: 08/05/2017 2:33 PM MRN: [id number] Endoscopist: Docia Chuck. Donna Gilmore , MD Age: 77 Referring MD:  Date of Birth: March 28, 1940 Gender: Female Account #: [id number] Procedure:                Colonoscopy, with cold snare polypectomy x 1 Indications:              Heme positive stool Medicines:                Monitored Anesthesia Care Procedure:                Pre-Anesthesia Assessment:                           - Prior to the procedure, a History and Physical                            was performed, and patient medications and                            allergies were reviewed. The patient's tolerance of                            previous anesthesia was also reviewed. The risks                            and benefits of the procedure and the sedation                            options and risks were discussed with the patient.                            All questions were answered, and informed consent                            was obtained. Prior Anticoagulants: The patient has                            taken Plavix (clopidogrel), last dose was day of                            procedure. ASA Grade Assessment: III - A patient                            with severe systemic disease. After reviewing the                            risks and benefits, the patient was deemed in                            satisfactory condition to undergo the procedure.                           After obtaining informed consent, the colonoscope  was passed under direct vision. Throughout the                            procedure, the patient's blood pressure, pulse, and                            oxygen saturations were monitored continuously. The                            Colonoscope was introduced through the anus and                            advanced to the the cecum, identified by                            appendiceal  orifice and ileocecal valve. The                            ileocecal valve, appendiceal orifice, and rectum                            were photographed. The quality of the bowel                            preparation was excellent. The colonoscopy was                            performed without difficulty. The patient tolerated                            the procedure well. The bowel preparation used was                            SUPREP. Scope In: 2:38:03 PM Scope Out: 2:53:51 PM Scope Withdrawal Time: 0 hours 8 minutes 59 seconds  Total Procedure Duration: 0 hours 15 minutes 48 seconds  Findings:                 A 3 mm polyp was found in the ascending colon.                           Internal hemorrhoids were found during retroflexion.                           The exam was otherwise without abnormality on                            direct and retroflexion views. Complications:            No immediate complications. Estimated blood loss:                            None. Estimated Blood Loss:     Estimated blood loss: none. Impression:               - One 3 mm polyp  in the ascending colon.                           - Internal hemorrhoids.                           - The examination was otherwise normal on direct                            and retroflexion views.                           - No specimens collected. Recommendation:           - Repeat colonoscopy is not recommended for                            surveillance.                           - Resume (continue) Plavix (clopidogrel) today at                            prior dose.                           - Patient has a contact number available for                            emergencies. The signs and symptoms of potential                            delayed complications were discussed with the                            patient. Return to normal activities tomorrow.                            Written discharge instructions  were provided to the                            patient.                           - Resume previous diet.                           - Continue present medications.                           - Await pathology results.                           - Return to the care of your primary care provider Docia Chuck. Donna Pastor, MD 08/05/2017 2:59:30 PM This report has been signed electronically.

## 2017-08-05 NOTE — Progress Notes (Signed)
Report to RN, VSS, adequate respirations noted, no c/o pain or discomfort 

## 2017-08-05 NOTE — Progress Notes (Signed)
Called to room to assist during endoscopic procedure.  Patient ID and intended procedure confirmed with present staff. Received instructions for my participation in the procedure from the performing physician.  

## 2017-08-05 NOTE — Patient Instructions (Signed)
Please read handouts on polyps and hemorrhoids. Please resume Plavix at prior dose.     YOU HAD AN ENDOSCOPIC PROCEDURE TODAY AT [facility]:   Refer to the procedure report that was given to you for any specific questions about what was found during the examination.  If the procedure report does not answer your questions, please call your gastroenterologist to clarify.  If you requested that your care partner not be given the details of your procedure findings, then the procedure report has been included in a sealed envelope for you to review at your convenience later.  YOU SHOULD EXPECT: Some feelings of bloating in the abdomen. Passage of more gas than usual.  Walking can help get rid of the air that was put into your GI tract during the procedure and reduce the bloating. If you had a lower endoscopy (such as a colonoscopy or flexible sigmoidoscopy) you may notice spotting of blood in your stool or on the toilet paper. If you underwent a bowel prep for your procedure, you may not have a normal bowel movement for a few days.  Please Note:  You might notice some irritation and congestion in your nose or some drainage.  This is from the oxygen used during your procedure.  There is no need for concern and it should clear up in a day or so.  SYMPTOMS TO REPORT IMMEDIATELY:   Following lower endoscopy (colonoscopy or flexible sigmoidoscopy):  Excessive amounts of blood in the stool  Significant tenderness or worsening of abdominal pains  Swelling of the abdomen that is new, acute  Fever of 100F or higher    For urgent or emergent issues, a gastroenterologist can be reached at any hour by calling [phone].   DIET:  We do recommend a small meal at first, but then you may proceed to your regular diet.  Drink plenty of fluids but you should avoid alcoholic beverages for 24 hours.  ACTIVITY:  You should plan to take it easy for the rest of today and you should NOT DRIVE or  use heavy machinery until tomorrow (because of the sedation medicines used during the test).    FOLLOW UP: Our staff will call the number listed on your records the next business day following your procedure to check on you and address any questions or concerns that you may have regarding the information given to you following your procedure. If we do not reach you, we will leave a message.  However, if you are feeling well and you are not experiencing any problems, there is no need to return our call.  We will assume that you have returned to your regular daily activities without incident.  If any biopsies were taken you will be contacted by phone or by letter within the next 1-3 weeks.  Please call us at [phone] if you have not heard about the biopsies in 3 weeks.    SIGNATURES/CONFIDENTIALITY: You and/or your care partner have signed paperwork which will be entered into your electronic medical record.  These signatures attest to the fact that that the information above on your After Visit Summary has been reviewed and is understood.  Full responsibility of the confidentiality of this discharge information lies with you and/or your care-partner.

## 2017-08-06 ENCOUNTER — Telehealth: Payer: Self-pay | Admitting: *Deleted

## 2017-08-06 NOTE — Telephone Encounter (Signed)
  Follow up Call-  Call back number 08/05/2017  Post procedure Call Back phone  # [phone]  Permission to leave phone message Yes  Some recent data might be hidden     Patient questions:  Do you have a fever, pain , or abdominal swelling? No. Pain Score  0 *  Have you tolerated food without any problems? Yes.    Have you been able to return to your normal activities? Yes.    Do you have any questions about your discharge instructions: Diet   No. Medications  No. Follow up visit  No.  Do you have questions or concerns about your Care? No.  Actions: * If pain score is 4 or above: No action needed, pain <4.

## 2017-08-07 ENCOUNTER — Encounter: Payer: Self-pay | Admitting: Internal Medicine

## 2017-09-13 ENCOUNTER — Ambulatory Visit (HOSPITAL_COMMUNITY)
Admission: EM | Admit: 2017-09-13 | Discharge: 2017-09-13 | Disposition: A | Discharge status: Home / Self Care | Payer: Medicare Other | Attending: Family Medicine | Admitting: Family Medicine

## 2017-09-13 ENCOUNTER — Encounter (HOSPITAL_COMMUNITY): Payer: Self-pay

## 2017-09-13 ENCOUNTER — Other Ambulatory Visit: Payer: Self-pay

## 2017-09-13 DIAGNOSIS — M546 Pain in thoracic spine: Secondary | ICD-10-CM

## 2017-09-13 DIAGNOSIS — M79602 Pain in left arm: Secondary | ICD-10-CM

## 2017-09-13 MED ORDER — HYDROCODONE-ACETAMINOPHEN 5-325 MG PO TABS
ORAL_TABLET | ORAL | Status: AC
  Filled 2017-09-13: qty 1

## 2017-09-13 MED ORDER — HYDROCODONE-ACETAMINOPHEN 5-325 MG PO TABS
1.0000 | ORAL_TABLET | Freq: Once | ORAL | Status: AC
  Administered 2017-09-13: 1 via ORAL

## 2017-09-13 MED ORDER — PREDNISONE 20 MG PO TABS: 40.0000 mg | ORAL_TABLET | Freq: Every day | ORAL | 0 refills | Status: AC

## 2017-09-13 MED ORDER — LIDOCAINE 5 % EX PTCH: 1.0000 | MEDICATED_PATCH | CUTANEOUS | 0 refills | Status: DC

## 2017-09-13 MED ORDER — HYDROCODONE-ACETAMINOPHEN 5-325 MG PO TABS: 1.0000 | ORAL_TABLET | Freq: Four times a day (QID) | ORAL | 0 refills | Status: DC | PRN

## 2017-09-13 NOTE — ED Provider Notes (Signed)
[place]    CSN: [id number] Arrival date & time: 09/13/17  1017     History   Chief Complaint Chief Complaint  Patient presents with  . Arm Pain    HPI Donna Gilmore is a 77 y.o. female.   77 year old female with history of CVA, HLP, HTN, comes in with son for 2-day history of left neck, shoulder, arm pain.  This has been an ongoing issue that has been seen by PCP, normal x-rays, waiting for referral to chiropractor (?  Orthopedic).  States pain is worse with movement.  Denies radiation of pain, numbness, tingling.  Denies one-sided weakness, chest pain, shortness of breath, palpitation.  States for the past 2 nights, has had trouble sleeping due to pain.  Has been taking Tylenol without relief.     Past Medical History:  Diagnosis Date  . Benign paroxysmal positional vertigo 08/26/2012  . CVA (cerebral infarction) 08/25/2012  . Dyslipidemia 08/25/2012  . Essential hypertension, benign 08/25/2012  . Hyperlipidemia   . Hypertension   . Stroke [facility])     Patient Active Problem List   Diagnosis Date Noted  . MCI (mild cognitive impairment) 10/14/2016  . History of stroke 10/14/2016  . History of syphilis 10/14/2016  . Diabetes mellitus ([place]) 10/14/2016  . Mixed hyperlipidemia 10/14/2016  . Insomnia 10/14/2016  . Cerebellar stroke syndrome 10/23/2013  . Smoker 02/16/2013  . Chest pain 09/21/2012  . Tobacco abuse disorder 09/21/2012  . Benign paroxysmal positional vertigo 08/26/2012  . CVA (cerebral infarction) 08/25/2012  . Essential hypertension 08/25/2012  . Dyslipidemia 08/25/2012    History reviewed. No pertinent surgical history.  OB History   None      Home Medications    Prior to Admission medications   Medication Sig Start Date End Date Taking? Authorizing Provider  albuterol (PROAIR HFA) 108 (90 Base) MCG/ACT inhaler Inhale 2 puffs into the lungs every 6 (six) hours as needed for wheezing or shortness of breath.   Yes [provider]  atorvastatin (LIPITOR) 40 MG tablet Take 1 tablet (40 mg total) by mouth daily. 08/28/12  Yes Eugenie Filler, MD  Azelastine-Fluticasone [facility]) [phone] MCG/ACT SUSP Use one spray in each nostril twice a day as directed. 11/05/16  Yes Kozlow, Donnamarie Poag, MD  clopidogrel (PLAVIX) 75 MG tablet Take 1 tablet (75 mg total) by mouth daily with breakfast. 08/28/12  Yes Eugenie Filler, MD  fluticasone [facility]) 50 MCG/ACT nasal spray Place 1 spray into both nostrils daily. 04/07/15  Yes Melony Overly, MD  Ginkgo Biloba 500 MG CAPS Take by mouth 2 times daily at 12 noon and 4 pm.   Yes [provider]  levocetirizine (XYZAL) 5 MG tablet Take 5 mg by mouth every evening.  09/20/16  Yes [provider]  meclizine (ANTIVERT) 25 MG tablet Take 1 tablet (25 mg total) by mouth 3 (three) times daily as needed for dizziness. 09/30/13  Yes Dorie Rank, MD  olmesartan (BENICAR) 20 MG tablet Take 20 mg by mouth daily. 07/20/17  Yes [provider]  Deaf [place]    Yes [provider]  HYDROcodone-acetaminophen (NORCO/VICODIN) 5-325 MG tablet Take 1 tablet by mouth every 6 (six) hours as needed. 09/13/17   Tasia Catchings, Richel Millspaugh V, PA-C  lidocaine (LIDODERM) 5 % Place 1 patch onto the skin daily. Remove & Discard patch within 12 hours or as directed by MD 09/13/17   Tasia Catchings, Graylyn Bunney V, PA-C  predniSONE (DELTASONE) 20 MG tablet  Take 2 tablets (40 mg total) by mouth daily for 5 days. 09/13/17 09/18/17  Ok Edwards, PA-C    Family History Family History  Problem Relation Age of Onset  . Kidney failure Mother   . Diabetes Mother   . Alzheimer's disease Mother   . Alzheimer's disease Father   . Prostate cancer Father   . Diabetes Maternal Grandmother   . Diabetes Maternal Grandfather   . Alzheimer's disease Paternal Grandmother   . Alzheimer's disease Paternal Grandfather   . Prostate cancer Paternal Uncle     Social History Social History   Tobacco Use  . Smoking status:  Former Smoker    Packs/day: 0.30    Years: 30.00    Pack years: 9.00    Types: Cigarettes    Last attempt to quit: 2019    Years since quitting: 0.6  . Smokeless tobacco: Never Used  Substance Use Topics  . Alcohol use: Yes  . Drug use: No     Allergies   Patient has no known allergies.   Review of Systems Review of Systems  Reason unable to perform ROS: See HPI as above.     Physical Exam Triage Vital Signs ED Triage Vitals  Enc Vitals Group     BP 09/13/17 1045 (!) 163/83     Pulse Rate 09/13/17 1045 61     Resp 09/13/17 1045 16     Temp 09/13/17 1045 97.7 F (36.5 C)     Temp Source 09/13/17 1045 Oral     SpO2 09/13/17 1045 100 %     Weight --      Height --      Head Circumference --      Peak Flow --      Pain Score 09/13/17 1048 8     Pain Loc --      Pain Edu? --      Excl. in [place]? --    No data found.  Updated Vital Signs BP (!) 163/83 (BP Location: Right Arm)   Pulse 61   Temp 97.7 F (36.5 C) (Oral)   Resp 16   SpO2 100%   Physical Exam  Constitutional: She is oriented to person, place, and time. She appears well-developed and well-nourished. No distress.  HENT:  Head: Normocephalic and atraumatic.  Eyes: Pupils are equal, round, and reactive to light. Conjunctivae are normal.  Neck: Normal range of motion. Neck supple.  Cardiovascular: Normal rate, regular rhythm and normal heart sounds. Exam reveals no gallop and no friction rub.  No murmur heard. Pulmonary/Chest: Effort normal and breath sounds normal. No accessory muscle usage or stridor. No respiratory distress. She has no decreased breath sounds. She has no wheezes. She has no rhonchi. She has no rales.  Musculoskeletal:  No tenderness on palpation of the spinous processes.  Diffuse tenderness to palpation of left neck, trapezius, shoulder, arm.  Decreased active and passive range of motion of the shoulder due to pain.  Full range of motion of elbow and neck. Strength normal and equal  bilaterally.  Normal grip strength.  Sensation intact and equal bilaterally.  Radial pulses 2+ and equal bilaterally. Capillary refill less than 2 seconds.   Neurological: She is alert and oriented to person, place, and time.  Skin: Skin is warm and dry. She is not diaphoretic.     UC Treatments / Results  Labs (all labs ordered are listed, but only abnormal results are displayed) Labs Reviewed - No data to display  EKG None  Radiology No results found.  Procedures Procedures (including critical care time)  Medications Ordered in UC Medications  HYDROcodone-acetaminophen (NORCO/VICODIN) 5-325 MG per tablet 1 tablet (1 tablet Oral Given 09/13/17 1121)    Initial Impression / Assessment and Plan / UC Course  I have reviewed the triage vital signs and the nursing notes.  Pertinent labs & imaging results that were available during my care of the patient were reviewed by me and considered in my medical decision making (see chart for details).    Will have patient start prednisone, Lidoderm patches as directed.  Norco for breakthrough pain.  Discussed specifically with son to watch out for respiratory depression, can try half a dose of Norco to start out with.  Follow-up as scheduled by PCP for further evaluation and management needed.  Return precautions given.  Son expresses understanding and agrees to plan.  Final Clinical Impressions(s) / UC Diagnoses   Final diagnoses:  Left arm pain  Acute left-sided thoracic back pain    ED Prescriptions    Medication Sig Dispense Auth. Provider   predniSONE (DELTASONE) 20 MG tablet Take 2 tablets (40 mg total) by mouth daily for 5 days. 10 tablet Loleta Frommelt V, PA-C   lidocaine (LIDODERM) 5 % Place 1 patch onto the skin daily. Remove & Discard patch within 12 hours or as directed by MD 15 patch Arley Salamone V, PA-C   HYDROcodone-acetaminophen (NORCO/VICODIN) 5-325 MG tablet Take 1 tablet by mouth every 6 (six) hours as needed. 10 tablet Ok Edwards, PA-C     Controlled Substance Prescriptions [place] Controlled Substance Registry consulted? Yes, I have consulted the  Controlled Substances Registry for this patient, and feel the risk/benefit ratio today is favorable for proceeding with this prescription for a controlled substance.   Ok Edwards, PA-C 09/13/17 1149

## 2017-09-13 NOTE — ED Triage Notes (Signed)
Pt presents today with with left neck, shoulder and arm pain that started 2 days ago. States she has had this issue before and has seen her PCP about the issue. She has not been able to sleep the past 2 nights due to the severity of pain.

## 2017-09-13 NOTE — Discharge Instructions (Signed)
Start prednisone and lidoderm patches as directed. You can take norco for breakthrough pain. Norco contains tylenol, so please to do not take additional tylenol. Follow up as scheduled by your PCP for further evaluation and management needed.

## 2017-10-14 ENCOUNTER — Emergency Department (HOSPITAL_COMMUNITY): Payer: Medicare Other

## 2017-10-14 ENCOUNTER — Other Ambulatory Visit: Payer: Self-pay

## 2017-10-14 ENCOUNTER — Emergency Department (HOSPITAL_COMMUNITY)
Admission: EM | Admit: 2017-10-14 | Discharge: 2017-10-14 | Disposition: A | Discharge status: Home / Self Care | Payer: Medicare Other | Attending: Emergency Medicine | Admitting: Emergency Medicine

## 2017-10-14 ENCOUNTER — Encounter (HOSPITAL_COMMUNITY): Payer: Self-pay | Admitting: Emergency Medicine

## 2017-10-14 DIAGNOSIS — W57XXXA Bitten or stung by nonvenomous insect and other nonvenomous arthropods, initial encounter: Secondary | ICD-10-CM | POA: Diagnosis not present

## 2017-10-14 DIAGNOSIS — R51 Headache: Secondary | ICD-10-CM | POA: Diagnosis not present

## 2017-10-14 DIAGNOSIS — R519 Headache, unspecified: Secondary | ICD-10-CM

## 2017-10-14 DIAGNOSIS — Z87891 Personal history of nicotine dependence: Secondary | ICD-10-CM | POA: Diagnosis not present

## 2017-10-14 DIAGNOSIS — Y999 Unspecified external cause status: Secondary | ICD-10-CM | POA: Diagnosis not present

## 2017-10-14 DIAGNOSIS — I10 Essential (primary) hypertension: Secondary | ICD-10-CM | POA: Diagnosis not present

## 2017-10-14 DIAGNOSIS — S20369A Insect bite (nonvenomous) of unspecified front wall of thorax, initial encounter: Secondary | ICD-10-CM | POA: Diagnosis not present

## 2017-10-14 DIAGNOSIS — Y939 Activity, unspecified: Secondary | ICD-10-CM | POA: Insufficient documentation

## 2017-10-14 DIAGNOSIS — Z79899 Other long term (current) drug therapy: Secondary | ICD-10-CM | POA: Diagnosis not present

## 2017-10-14 DIAGNOSIS — R42 Dizziness and giddiness: Secondary | ICD-10-CM | POA: Diagnosis present

## 2017-10-14 DIAGNOSIS — Y929 Unspecified place or not applicable: Secondary | ICD-10-CM | POA: Insufficient documentation

## 2017-10-14 LAB — CBC
HCT: 43.3 % (ref 36.0–46.0)
Hemoglobin: 13.2 g/dL (ref 12.0–15.0)
MCH: 28.3 pg (ref 26.0–34.0)
MCHC: 30.5 g/dL (ref 30.0–36.0)
MCV: 92.7 fL (ref 78.0–100.0)
PLATELETS: 254 10*3/uL (ref 150–400)
RBC: 4.67 MIL/uL (ref 3.87–5.11)
RDW: 15.4 % (ref 11.5–15.5)
WBC: 11.1 10*3/uL — AB (ref 4.0–10.5)

## 2017-10-14 LAB — CBG MONITORING, ED: GLUCOSE-CAPILLARY: 149 mg/dL — AB (ref 70–99)

## 2017-10-14 LAB — BASIC METABOLIC PANEL
Anion gap: 12 (ref 5–15)
BUN: 9 mg/dL (ref 8–23)
CHLORIDE: 105 mmol/L (ref 98–111)
CO2: 24 mmol/L (ref 22–32)
CREATININE: 1.16 mg/dL — AB (ref 0.44–1.00)
Calcium: 9.7 mg/dL (ref 8.9–10.3)
GFR calc non Af Amer: 44 mL/min — ABNORMAL LOW (ref >60)
GFR, EST AFRICAN AMERICAN: 51 mL/min — AB (ref >60)
Glucose, Bld: 135 mg/dL — ABNORMAL HIGH (ref 70–99)
POTASSIUM: 4.4 mmol/L (ref 3.5–5.1)
SODIUM: 141 mmol/L (ref 135–145)

## 2017-10-14 LAB — I-STAT TROPONIN, ED: TROPONIN I, POC: 0 ng/mL (ref 0.00–0.08)

## 2017-10-14 MED ORDER — ACETAMINOPHEN 500 MG PO TABS
1000.0000 mg | ORAL_TABLET | Freq: Once | ORAL | Status: DC
  Filled 2017-10-14: qty 2

## 2017-10-14 MED ORDER — MECLIZINE HCL 25 MG PO TABS
25.0000 mg | ORAL_TABLET | Freq: Once | ORAL | Status: DC
  Filled 2017-10-14: qty 1

## 2017-10-14 MED ORDER — DIAZEPAM 5 MG PO TABS: 2.5000 mg | ORAL_TABLET | Freq: Three times a day (TID) | ORAL | 0 refills | Status: DC | PRN

## 2017-10-14 MED ORDER — DIAZEPAM 5 MG/ML IJ SOLN
2.5000 mg | Freq: Once | INTRAMUSCULAR | Status: AC
  Administered 2017-10-14: 2.5 mg via INTRAVENOUS
  Filled 2017-10-14: qty 2

## 2017-10-14 NOTE — ED Triage Notes (Signed)
Pt states since saturday she has had a headache with dizziness and feeling nauseous. Pt reports history of vertigo. Pt states she woke up Saturday with a bug bite on the center of her chest.

## 2017-10-14 NOTE — ED Notes (Signed)
Pt has been feeling generally ill and dizzy for "several days" ambulatory to ED

## 2017-10-14 NOTE — Discharge Instructions (Signed)
1.  Take your meclizine as prescribed for vertigo.  Take acetaminophen every 6 hours if needed for headache.  If you are having significant vertigo not improved by her meclizine, you may take a 2.5 mg dose of Valium.  Pay attention to additional sedation and possible falls with use of Valium. 2.  Follow-up with your family doctor within the next 3 to 5 days for recheck.

## 2017-10-14 NOTE — ED Provider Notes (Signed)
[facility] EMERGENCY DEPARTMENT Provider Note   CSN: [id number] Arrival date & time: 10/14/17  1143     History   Chief Complaint Chief Complaint  Patient presents with  . Dizziness  . Nausea    HPI Donna Gilmore is a 77 y.o. female.  HPI Patient reports that she awakened with a headache and dizziness 3 days ago.  She reports the headache is mostly frontal and around the top of her head.  She gets a feeling of "wind or air" coming up the back of her head.  And trying to further identify what the patient means, this is a sort of dizziness/lightheadedness feeling.  She also indicates she is had a lot of feeling of spinning and pressure in her head.  No focal weakness numbness or tingling of the extremities.  The patient reports she has a very long history of vertigo and routinely takes meclizine.  She states however this feels different from her usual vertigo.  Patient's daughter feels that her speech has been slightly slurred.  Patient has prior history of stroke per her daughter's report.  Patient has not been sick recently.  No fevers no chills.  She gives some vague endorsement of shortness of breath and chest pain as well.  It does sound as though the shortness of breath is a more chronic issue.  She does not identify any particular time of onset.  Patient has noticed a small red spot on her upper abdomen that is like an insect bite.  She reports that it is more painful than itchy.  She wonders if it is making her sick. Past Medical History:  Diagnosis Date  . Benign paroxysmal positional vertigo 08/26/2012  . CVA (cerebral infarction) 08/25/2012  . Dyslipidemia 08/25/2012  . Essential hypertension, benign 08/25/2012  . Hyperlipidemia   . Hypertension   . Stroke [facility])     Patient Active Problem List   Diagnosis Date Noted  . MCI (mild cognitive impairment) 10/14/2016  . History of stroke 10/14/2016  . History of syphilis 10/14/2016  . Diabetes mellitus  ([place]) 10/14/2016  . Mixed hyperlipidemia 10/14/2016  . Insomnia 10/14/2016  . Cerebellar stroke syndrome 10/23/2013  . Smoker 02/16/2013  . Chest pain 09/21/2012  . Tobacco abuse disorder 09/21/2012  . Benign paroxysmal positional vertigo 08/26/2012  . CVA (cerebral infarction) 08/25/2012  . Essential hypertension 08/25/2012  . Dyslipidemia 08/25/2012    History reviewed. No pertinent surgical history.   OB History   None      Home Medications    Prior to Admission medications   Medication Sig Start Date End Date Taking? Authorizing Provider  albuterol (PROAIR HFA) 108 (90 Base) MCG/ACT inhaler Inhale 2 puffs into the lungs every 6 (six) hours as needed for wheezing or shortness of breath.    [provider]  atorvastatin (LIPITOR) 40 MG tablet Take 1 tablet (40 mg total) by mouth daily. 08/28/12   Eugenie Filler, MD  Azelastine-Fluticasone [facility]) [phone] MCG/ACT SUSP Use one spray in each nostril twice a day as directed. 11/05/16   Kozlow, Donnamarie Poag, MD  clopidogrel (PLAVIX) 75 MG tablet Take 1 tablet (75 mg total) by mouth daily with breakfast. 08/28/12   Eugenie Filler, MD  diazepam (VALIUM) 5 MG tablet Take 0.5 tablets (2.5 mg total) by mouth every 8 (eight) hours as needed for anxiety (VERTIGO). 10/14/17   Charlesetta Shanks, MD  fluticasone (FLONASE) 50 MCG/ACT nasal spray Place 1 spray into both nostrils daily.  04/07/15   Melony Overly, MD  Ginkgo Biloba 500 MG CAPS Take by mouth 2 times daily at 12 noon and 4 pm.    [provider]  HYDROcodone-acetaminophen (NORCO/VICODIN) 5-325 MG tablet Take 1 tablet by mouth every 6 (six) hours as needed. 09/13/17   Tasia Catchings, Amy V, PA-C  levocetirizine (XYZAL) 5 MG tablet Take 5 mg by mouth every evening.  09/20/16   [provider]  lidocaine (LIDODERM) 5 % Place 1 patch onto the skin daily. Remove & Discard patch within 12 hours or as directed by MD 09/13/17   Ok Edwards, PA-C  meclizine (ANTIVERT) 25 MG tablet Take 1  tablet (25 mg total) by mouth 3 (three) times daily as needed for dizziness. 09/30/13   Dorie Rank, MD  olmesartan (BENICAR) 20 MG tablet Take 20 mg by mouth daily. 07/20/17   [provider]  OVER THE COUNTER MEDICATION     [provider]    Family History Family History  Problem Relation Age of Onset  . Kidney failure Mother   . Diabetes Mother   . Alzheimer's disease Mother   . Alzheimer's disease Father   . Prostate cancer Father   . Diabetes Maternal Grandmother   . Diabetes Maternal Grandfather   . Alzheimer's disease Paternal Grandmother   . Alzheimer's disease Paternal Grandfather   . Prostate cancer Paternal Uncle     Social History Social History   Tobacco Use  . Smoking status: Former Smoker    Packs/day: 0.30    Years: 30.00    Pack years: 9.00    Types: Cigarettes    Last attempt to quit: 2019    Years since quitting: 0.7  . Smokeless tobacco: Never Used  Substance Use Topics  . Alcohol use: Yes  . Drug use: No     Allergies   Patient has no known allergies.   Review of Systems Review of Systems 10 Systems reviewed and are negative for acute change except as noted in the HPI.   Physical Exam Updated Vital Signs BP 140/65 (BP Location: Right Arm)   Pulse (!) 56   Temp 98 F (36.7 C) (Oral)   Resp 16   SpO2 100%   Physical Exam  Constitutional: She is oriented to person, place, and time. She appears well-developed and well-nourished.  HENT:  Head: Normocephalic and atraumatic.  Right Ear: External ear normal.  Left Ear: External ear normal.  Nose: Nose normal.  Mouth/Throat: Oropharynx is clear and moist.  Bilateral TMs normal.  Eyes: Pupils are equal, round, and reactive to light. EOM are normal.  Neck: Neck supple.  Cardiovascular: Normal rate, regular rhythm, normal heart sounds and intact distal pulses.  Pulmonary/Chest: Effort normal and breath sounds normal.  Abdominal: Soft. Bowel sounds are normal. She exhibits no  distension. There is no tenderness.  Musculoskeletal: Normal range of motion. She exhibits no edema or tenderness.  Neurological: She is alert and oriented to person, place, and time. She has normal strength. No cranial nerve deficit or sensory deficit. She exhibits normal muscle tone. Coordination normal. GCS eye subscore is 4. GCS verbal subscore is 5. GCS motor subscore is 6.  Patient is alert and clinically well in appearance.  Normal cranial nerve exam.  Speech is normal content.  Normal motor exam with no pronator drift.  Patient does not endorse decreased sensation to light touch on the 4 extremities.  Skin: Skin is warm, dry and intact.  Patient has a less  than 5 mm, round erythematous papule in the midline of her upper abdomen.  No associated pustule or vesicle.  At this time, quite innocuous in appearance.  No other lesions on the chest wall or breasts.  Psychiatric: She has a normal mood and affect.     ED Treatments / Results  Labs (all labs ordered are listed, but only abnormal results are displayed) Labs Reviewed  BASIC METABOLIC PANEL - Abnormal; Notable for the following components:      Result Value   Glucose, Bld 135 (*)    Creatinine, Ser 1.16 (*)    GFR calc non Af Amer 44 (*)    GFR calc Af Amer 51 (*)    All other components within normal limits  CBC - Abnormal; Notable for the following components:   WBC 11.1 (*)    All other components within normal limits  CBG MONITORING, ED - Abnormal; Notable for the following components:   Glucose-Capillary 149 (*)    All other components within normal limits  URINALYSIS, ROUTINE W REFLEX MICROSCOPIC  I-STAT TROPONIN, ED    EKG EKG Interpretation  Date/Time:  Tuesday October 14 2017 11:56:49 EDT Ventricular Rate:  65 PR Interval:  126 QRS Duration: 72 QT Interval:  366 QTC Calculation: 380 R Axis:   36 Text Interpretation:  Normal sinus rhythm Normal ECG normal. no change from previous Confirmed by Charlesetta Shanks [phone]) on 10/14/2017 1:14:27 PM   Radiology Mr Brain Wo Contrast  Result Date: 10/14/2017 CLINICAL DATA:  Headache with dizziness and nausea over the last 3 days. EXAM: MRI HEAD WITHOUT CONTRAST TECHNIQUE: Multiplanar, multiecho pulse sequences of the brain and surrounding structures were obtained without intravenous contrast. COMPARISON:  CT head 04/01/2017.  MRI 09/30/2013. MRI 08/25/2012 FINDINGS: Brain: Diffusion imaging does not show any acute or subacute infarction. Brainstem is normal. There are a few old small vessel cerebellar infarctions. Cerebral hemispheres show mild chronic small-vessel change of the deep and subcortical white matter. No cortical or large vessel territory infarction. No mass lesion, hemorrhage, hydrocephalus or extra-axial collection. Scattered dural calcifications as seen previously appear similar. 7 mm abnormality again seen in the right side of the sella. This could be a Rathke's cleft cyst or a non enlarging adenoma. Vascular: Major vessels at the base of the brain show flow. Skull and upper cervical spine: Negative Sinuses/Orbits: Sinuses are clear. Orbits are negative. Mastoids are clear. Orbits are negative. Other: Lobular mass the right parotid gland is again demonstrated, not completely or primarily evaluated. This was noted on the study of 2015 but may be larger. Has this been evaluated? IMPRESSION: No acute intracranial finding. Old small vessel cerebellar infarctions. Mild chronic small-vessel ischemic change of the hemispheric white matter. Lobular right parotid mass, not primarily or completely evaluated. This was present on the previous study but may be larger. Has this been evaluated? 7 mm rounded lesion of the right side of the parotid, similar to the study of 2014. This probably represents either a Rathke's cyst or a non enlarging micro adenoma. Significance would be doubtful. Electronically Signed   By: Nelson Chimes M.D.   On: 10/14/2017 16:17     Procedures Procedures (including critical care time)  Medications Ordered in ED Medications  meclizine (ANTIVERT) tablet 25 mg (25 mg Oral Not Given 10/14/17 1357)  acetaminophen (TYLENOL) tablet 1,000 mg (1,000 mg Oral Not Given 10/14/17 1356)  diazepam (VALIUM) injection 2.5 mg (2.5 mg Intravenous Given 10/14/17 1430)     Initial Impression /  Assessment and Plan / ED Course  I have reviewed the triage vital signs and the nursing notes.  Pertinent labs & imaging results that were available during my care of the patient were reviewed by me and considered in my medical decision making (see chart for details).    Patient describes headaches that awaken her a couple days ago and vertigo that she felt is different from typical for her.  MRI does not show any acute findings of new stroke or bleed.  His mental status is clear.  She has non-focal neurologic examination.  At this time, it appears most consistent with her previously diagnosed vertigo.  Recommend that the patient take her meclizine as prescribed.  Valium 2.5 prescribed for vertigo worse than typical.  Patient has an innocuous appearing insect bite on the central upper abdomen.  Does not appear consistent with shingles.  It is quite in the midline and no other lesions present.  At this time, no other intervention needed.  Patient is to follow-up with her PCP this week for recheck.  Final Clinical Impressions(s) / ED Diagnoses   Final diagnoses:  Vertigo  Acute nonintractable headache, unspecified headache type  Insect bite of front wall of thorax, unspecified laterality, initial encounter    ED Discharge Orders         Ordered    diazepam (VALIUM) 5 MG tablet  Every 8 hours PRN     10/14/17 1632           Charlesetta Shanks, MD 10/14/17 1636

## 2017-10-14 NOTE — ED Notes (Signed)
Wasted 7.5 mg Valium in sharps in pod c med room; witnessed by Nolon Lennert, RN

## 2017-10-14 NOTE — ED Notes (Signed)
Pt aware that urine sample is needed, but is unable to provide one at this time 

## 2017-10-14 NOTE — ED Notes (Addendum)
Patient transported to MRI 

## 2017-10-14 NOTE — ED Notes (Signed)
MRI notified that pt is ready for transport. 

## 2017-10-22 ENCOUNTER — Other Ambulatory Visit: Payer: Self-pay

## 2017-10-22 ENCOUNTER — Encounter (HOSPITAL_COMMUNITY): Payer: Self-pay | Admitting: *Deleted

## 2017-10-22 ENCOUNTER — Emergency Department (HOSPITAL_COMMUNITY)
Admission: EM | Admit: 2017-10-22 | Discharge: 2017-10-23 | Disposition: A | Discharge status: Home / Self Care | Payer: Medicare Other | Attending: Emergency Medicine | Admitting: Emergency Medicine

## 2017-10-22 DIAGNOSIS — N289 Disorder of kidney and ureter, unspecified: Secondary | ICD-10-CM | POA: Diagnosis not present

## 2017-10-22 DIAGNOSIS — Z87891 Personal history of nicotine dependence: Secondary | ICD-10-CM | POA: Diagnosis not present

## 2017-10-22 DIAGNOSIS — Z79899 Other long term (current) drug therapy: Secondary | ICD-10-CM | POA: Diagnosis not present

## 2017-10-22 DIAGNOSIS — H81399 Other peripheral vertigo, unspecified ear: Secondary | ICD-10-CM | POA: Diagnosis not present

## 2017-10-22 DIAGNOSIS — I10 Essential (primary) hypertension: Secondary | ICD-10-CM | POA: Diagnosis not present

## 2017-10-22 DIAGNOSIS — Z7902 Long term (current) use of antithrombotics/antiplatelets: Secondary | ICD-10-CM | POA: Diagnosis not present

## 2017-10-22 DIAGNOSIS — R600 Localized edema: Secondary | ICD-10-CM | POA: Insufficient documentation

## 2017-10-22 DIAGNOSIS — R42 Dizziness and giddiness: Secondary | ICD-10-CM | POA: Diagnosis present

## 2017-10-22 DIAGNOSIS — E119 Type 2 diabetes mellitus without complications: Secondary | ICD-10-CM | POA: Diagnosis not present

## 2017-10-22 DIAGNOSIS — Z8673 Personal history of transient ischemic attack (TIA), and cerebral infarction without residual deficits: Secondary | ICD-10-CM | POA: Insufficient documentation

## 2017-10-22 LAB — CBC
HEMATOCRIT: 39.8 % (ref 36.0–46.0)
Hemoglobin: 12.5 g/dL (ref 12.0–15.0)
MCH: 28.5 pg (ref 26.0–34.0)
MCHC: 31.4 g/dL (ref 30.0–36.0)
MCV: 90.9 fL (ref 78.0–100.0)
Platelets: 187 10*3/uL (ref 150–400)
RBC: 4.38 MIL/uL (ref 3.87–5.11)
RDW: 14.8 % (ref 11.5–15.5)
WBC: 8.4 10*3/uL (ref 4.0–10.5)

## 2017-10-22 LAB — BASIC METABOLIC PANEL
ANION GAP: 14 (ref 5–15)
BUN: 10 mg/dL (ref 8–23)
CHLORIDE: 105 mmol/L (ref 98–111)
CO2: 21 mmol/L — ABNORMAL LOW (ref 22–32)
Calcium: 9.8 mg/dL (ref 8.9–10.3)
Creatinine, Ser: 1.19 mg/dL — ABNORMAL HIGH (ref 0.44–1.00)
GFR, EST AFRICAN AMERICAN: 50 mL/min — AB (ref >60)
GFR, EST NON AFRICAN AMERICAN: 43 mL/min — AB (ref >60)
Glucose, Bld: 138 mg/dL — ABNORMAL HIGH (ref 70–99)
POTASSIUM: 3.6 mmol/L (ref 3.5–5.1)
SODIUM: 140 mmol/L (ref 135–145)

## 2017-10-22 LAB — CBG MONITORING, ED: Glucose-Capillary: 134 mg/dL — ABNORMAL HIGH (ref 70–99)

## 2017-10-22 LAB — I-STAT TROPONIN, ED: Troponin i, poc: 0 ng/mL (ref 0.00–0.08)

## 2017-10-22 MED ORDER — POTASSIUM CHLORIDE CRYS ER 20 MEQ PO TBCR
40.0000 meq | EXTENDED_RELEASE_TABLET | Freq: Once | ORAL | Status: DC
  Filled 2017-10-22: qty 2

## 2017-10-22 MED ORDER — MECLIZINE HCL 25 MG PO TABS
25.0000 mg | ORAL_TABLET | Freq: Once | ORAL | Status: AC
  Administered 2017-10-23: 25 mg via ORAL
  Filled 2017-10-22: qty 1

## 2017-10-22 NOTE — ED Triage Notes (Addendum)
Pt has been having ongoing lightheadedness for the past 3 hours. Has been checking her blood pressure at home and reports increased bp with the last one being around 1900 and was 160/107. Pt has hx of vertigo but reports this feels different. Denies any pain or NV. Had a BM pta that was loose. No neuro deficits at present. Pt also reports sob that she has used her inhaler for.

## 2017-10-22 NOTE — ED Provider Notes (Signed)
[facility] EMERGENCY DEPARTMENT Provider Note   CSN: [id number] Arrival date & time: 10/22/17  1949     History   Chief Complaint Chief Complaint  Patient presents with  . Dizziness    HPI Donna Gilmore is a 77 y.o. female.  The history is provided by the patient.  She has history of hypertension, diabetes, hyperlipidemia, cerebellar stroke, benign paroxysmal positional vertigo and comes in with feeling lightheaded with associated nausea which started about 6 PM.  She has been taking meclizine for 20 years, last dose was taken early this morning.  She has noted that her dizziness is worse with position change.  There has been some nausea, although that has resolved.  He has not vomited.  She does state that this dizziness is different from her usual vertigo.  She was checking her blood pressure at home and it started going higher and got over 073 systolic which scared her and convinced her to come to the emergency department.  Of note, she had been given a prescription for diazepam at an urgency department visit 1 week ago, but she has not taken any of it.  Past Medical History:  Diagnosis Date  . Benign paroxysmal positional vertigo 08/26/2012  . CVA (cerebral infarction) 08/25/2012  . Dyslipidemia 08/25/2012  . Essential hypertension, benign 08/25/2012  . Hyperlipidemia   . Hypertension   . Stroke [facility])     Patient Active Problem List   Diagnosis Date Noted  . MCI (mild cognitive impairment) 10/14/2016  . History of stroke 10/14/2016  . History of syphilis 10/14/2016  . Diabetes mellitus ([place]) 10/14/2016  . Mixed hyperlipidemia 10/14/2016  . Insomnia 10/14/2016  . Cerebellar stroke syndrome 10/23/2013  . Smoker 02/16/2013  . Chest pain 09/21/2012  . Tobacco abuse disorder 09/21/2012  . Benign paroxysmal positional vertigo 08/26/2012  . CVA (cerebral infarction) 08/25/2012  . Essential hypertension 08/25/2012  . Dyslipidemia 08/25/2012    History  reviewed. No pertinent surgical history.   OB History   None      Home Medications    Prior to Admission medications   Medication Sig Start Date End Date Taking? Authorizing Provider  albuterol (PROAIR HFA) 108 (90 Base) MCG/ACT inhaler Inhale 2 puffs into the lungs every 6 (six) hours as needed for wheezing or shortness of breath.    [provider]  atorvastatin (LIPITOR) 40 MG tablet Take 1 tablet (40 mg total) by mouth daily. 08/28/12   Eugenie Filler, MD  Azelastine-Fluticasone [facility]) [phone] MCG/ACT SUSP Use one spray in each nostril twice a day as directed. 11/05/16   Kozlow, Donnamarie Poag, MD  clopidogrel (PLAVIX) 75 MG tablet Take 1 tablet (75 mg total) by mouth daily with breakfast. 08/28/12   Eugenie Filler, MD  diazepam (VALIUM) 5 MG tablet Take 0.5 tablets (2.5 mg total) by mouth every 8 (eight) hours as needed for anxiety (VERTIGO). 10/14/17   Charlesetta Shanks, MD  fluticasone (FLONASE) 50 MCG/ACT nasal spray Place 1 spray into both nostrils daily. 04/07/15   Melony Overly, MD  Ginkgo Biloba 500 MG CAPS Take by mouth 2 times daily at 12 noon and 4 pm.    [provider]  HYDROcodone-acetaminophen (NORCO/VICODIN) 5-325 MG tablet Take 1 tablet by mouth every 6 (six) hours as needed. 09/13/17   Tasia Catchings, Amy V, PA-C  levocetirizine (XYZAL) 5 MG tablet Take 5 mg by mouth every evening.  09/20/16   [provider]  lidocaine (LIDODERM) 5 % Place  1 patch onto the skin daily. Remove & Discard patch within 12 hours or as directed by MD 09/13/17   Ok Edwards, PA-C  meclizine (ANTIVERT) 25 MG tablet Take 1 tablet (25 mg total) by mouth 3 (three) times daily as needed for dizziness. 09/30/13   Dorie Rank, MD  olmesartan (BENICAR) 20 MG tablet Take 20 mg by mouth daily. 07/20/17   [provider]  OVER THE COUNTER MEDICATION     [provider]    Family History Family History  Problem Relation Age of Onset  . Kidney failure Mother   . Diabetes Mother   .  Alzheimer's disease Mother   . Alzheimer's disease Father   . Prostate cancer Father   . Diabetes Maternal Grandmother   . Diabetes Maternal Grandfather   . Alzheimer's disease Paternal Grandmother   . Alzheimer's disease Paternal Grandfather   . Prostate cancer Paternal Uncle     Social History Social History   Tobacco Use  . Smoking status: Former Smoker    Packs/day: 0.30    Years: 30.00    Pack years: 9.00    Types: Cigarettes    Last attempt to quit: 2019    Years since quitting: 0.7  . Smokeless tobacco: Never Used  Substance Use Topics  . Alcohol use: Yes  . Drug use: No     Allergies   Patient has no known allergies.   Review of Systems Review of Systems  All other systems reviewed and are negative.    Physical Exam Updated Vital Signs BP (!) 145/74 (BP Location: Right Arm)   Pulse 76   Temp 99.1 F (37.3 C)   Resp 16   SpO2 99%   Physical Exam  Nursing note and vitals reviewed.  77 year old female, resting comfortably and in no acute distress. Vital signs are significant for mildly elevated systolic blood pressure. Oxygen saturation is 99%, which is normal. Head is normocephalic and atraumatic. PERRLA, EOMI. Oropharynx is clear. Neck is nontender and supple without adenopathy or JVD. Back is nontender and there is no CVA tenderness. Lungs are clear without rales, wheezes, or rhonchi. Chest is nontender. Heart has regular rate and rhythm without murmur. Abdomen is soft, flat, nontender without masses or hepatosplenomegaly and peristalsis is normoactive. Extremities have trace edema, full range of motion is present. Skin is warm and dry without rash. Neurologic: Mental status is normal, cranial nerves are intact, there are no motor or sensory deficits.  Rotary nystagmus is noted on right lateral gaze, and this does reproduce her symptoms.  Symptoms are also reproduced by passive head movement.  ED Treatments / Results  Labs (all labs ordered are  listed, but only abnormal results are displayed) Labs Reviewed  BASIC METABOLIC PANEL - Abnormal; Notable for the following components:      Result Value   CO2 21 (*)    Glucose, Bld 138 (*)    Creatinine, Ser 1.19 (*)    GFR calc non Af Amer 43 (*)    GFR calc Af Amer 50 (*)    All other components within normal limits  CBG MONITORING, ED - Abnormal; Notable for the following components:   Glucose-Capillary 134 (*)    All other components within normal limits  CBC  I-STAT TROPONIN, ED    EKG EKG Interpretation  Date/Time:  Wednesday October 22 2017 19:53:17 EDT Ventricular Rate:  98 PR Interval:  138 QRS Duration: 74 QT Interval:  336 QTC Calculation: 428  R Axis:   23 Text Interpretation:  Normal sinus rhythm Cannot rule out Anterior infarct , age undetermined Abnormal ECG When compared with ECG of 10/14/2017, No significant change was found Confirmed by Delora Fuel ([id number]) on 10/22/2017 11:27:30 PM   Procedures Procedures   Medications Ordered in ED Medications  potassium chloride SA (K-DUR,KLOR-CON) CR tablet 40 mEq (has no administration in time range)  meclizine (ANTIVERT) tablet 25 mg (has no administration in time range)     Initial Impression / Assessment and Plan / ED Course  I have reviewed the triage vital signs and the nursing notes.  Pertinent labs & imaging results that were available during my care of the patient were reviewed by me and considered in my medical decision making (see chart for details).  Dizziness which, on physical exam, seems clearly to be peripheral vertigo.  Old records are reviewed confirming ED visit on September 17 at which time MRI did not show any acute changes.  I do not see an indication for repeat MRI today.  Labs today do show a drop in potassium, but not out of the normal range.  We will give a dose of potassium in the ED.  ECG is unchanged from baseline.  Labs show mild renal insufficiency which is unchanged from baseline.   She had good symptomatic relief with meclizine.  I feel that she should continue with the meclizine on a as needed basis.  She states that she actually never had her diazepam prescription filled.  I reviewed her record on the [place] controlled substance reporting website, and this confirms no recent prescription for diazepam having been filled.  She is given a new prescription for diazepam to use for episodes of vertigo which do not respond to meclizine.  Final Clinical Impressions(s) / ED Diagnoses   Final diagnoses:  Peripheral vertigo, unspecified laterality  Renal insufficiency    ED Discharge Orders         Ordered    diazepam (VALIUM) 5 MG tablet  Every 8 hours PRN     10/23/17 0174           Delora Fuel, MD 94/49/67 [phone]

## 2017-10-22 NOTE — ED Notes (Signed)
Results reviewed.  No changes in acuity at this time 

## 2017-10-22 NOTE — ED Notes (Signed)
Steady on feet. Reports dizziness upon standing. Needs no assistance in standing.

## 2017-10-22 NOTE — ED Notes (Signed)
Patient's family member updated on delays and wait time

## 2017-10-22 NOTE — ED Provider Notes (Signed)
Patient placed in Quick Look pathway, seen and evaluated   Chief Complaint: lightheadedness, high BP  HPI:   Donna Gilmore is a 77 y.o. female with a history of vertigo, stroke, diabetes, hypertension and hyperlipidemia, who presents to the emergency department for evaluation of lightheadedness.  She reports she started feeling this way approximately 3 hours ago.  She reports symptoms are constant whether she is standing or sitting she feels lightheaded and like she might pass out.  She reports some associated shortness of breath but no chest pain.  No abdominal pain, nausea or vomiting.  No associated headache or vision changes.  She has a history of vertigo for which she was recently seen and had negative MRI, she reports this feels very different.  She reports she tried using her inhaler for shortness of breath without relief.  ROS: + Lightheadedness, shortness of breath. -Fevers, nausea, vomiting, chest pain, abdominal pain, headache, vision changes, dizziness  Physical Exam:   Gen: No distress  Neuro: Awake and Alert  Skin: Warm    Focused Exam: Heart RRR, lungs CTA bilateral, patient is alert and oriented, clear speech and follows commands, no focal cranial nerve deficits noted, she is moving all extremities without difficulty, 5/5 strength in bilateral upper and lower extremities and sensation intact in all extremities.   Initiation of care has begun. The patient has been counseled on the process, plan, and necessity for staying for the completion/evaluation, and the remainder of the medical screening examination   Janet Berlin 10/22/17 Felipa Furnace, MD 10/22/17 [phone]

## 2017-10-23 MED ORDER — POTASSIUM CHLORIDE 20 MEQ/15ML (10%) PO SOLN
40.0000 meq | Freq: Once | ORAL | Status: AC
  Administered 2017-10-23: 40 meq via ORAL
  Filled 2017-10-23: qty 30

## 2017-10-23 MED ORDER — DIAZEPAM 5 MG PO TABS: 2.5000 mg | ORAL_TABLET | Freq: Three times a day (TID) | ORAL | 0 refills | Status: DC | PRN

## 2017-10-23 NOTE — Discharge Instructions (Addendum)
Continue taking meclizine as needed for vertigo. You may take it as often as three times a day. Take diazepam (Valium) on those occasions where meclizine is not helping.

## 2017-11-05 ENCOUNTER — Ambulatory Visit (INDEPENDENT_AMBULATORY_CARE_PROVIDER_SITE_OTHER): Payer: Medicare Other | Admitting: Nurse Practitioner

## 2017-11-05 ENCOUNTER — Encounter: Payer: Self-pay | Admitting: Nurse Practitioner

## 2017-11-05 ENCOUNTER — Telehealth: Payer: Self-pay | Admitting: Nurse Practitioner

## 2017-11-05 VITALS — BP 142/96 | HR 94 | Temp 98.0°F | Ht 63.5 in | Wt 180.4 lb

## 2017-11-05 DIAGNOSIS — R413 Other amnesia: Secondary | ICD-10-CM | POA: Diagnosis not present

## 2017-11-05 DIAGNOSIS — Z23 Encounter for immunization: Secondary | ICD-10-CM

## 2017-11-05 DIAGNOSIS — R42 Dizziness and giddiness: Secondary | ICD-10-CM | POA: Diagnosis not present

## 2017-11-05 DIAGNOSIS — I69398 Other sequelae of cerebral infarction: Secondary | ICD-10-CM | POA: Diagnosis not present

## 2017-11-05 MED ORDER — DIAZEPAM 5 MG PO TABS: 2.5000 mg | ORAL_TABLET | Freq: Three times a day (TID) | ORAL | 0 refills | Status: DC | PRN

## 2017-11-05 MED ORDER — DONEPEZIL HCL 5 MG PO TABS: 5.0000 mg | ORAL_TABLET | Freq: Every day | ORAL | 2 refills | Status: DC

## 2017-11-05 NOTE — Progress Notes (Signed)
Subjective:     Patient ID: Donna Gilmore , female    DOB: December 07, 1940 , 77 y.o.   MRN: [id number]   She tells me in 2017 she was supposed to have Aricept.  Her son is noticing she is forgetting multiple things. She wanted to try other vitamins and therapies for her memory before taking the Aricept, she never started.    Her daughter Billey Co is present during the visit as well.    Dr. Dossie Der seen her in August and felt she may have polymalgia rheumatica -she was given a Depomedrol injection and prednisone tabs, had some relief however she tells me she made them aware she will not be back after her follow up. She continues to go to [employer] and feels better.    Dizziness  This is a chronic problem. The current episode started more than 1 year ago. The problem occurs intermittently. The problem has been gradually worsening. Associated symptoms include vertigo. The symptoms are aggravated by twisting and walking (sometimes occurs when sitting still as well. ). Treatments tried: meclizine, valium. The treatment provided mild relief.     Past Medical History:  Diagnosis Date  . Benign paroxysmal positional vertigo 08/26/2012  . CVA (cerebral infarction) 08/25/2012  . Dyslipidemia 08/25/2012  . Essential hypertension, benign 08/25/2012  . Hyperlipidemia   . Hypertension   . Stroke [facility])       Current Outpatient Medications:  .  albuterol (PROAIR HFA) 108 (90 Base) MCG/ACT inhaler, Inhale 2 puffs into the lungs every 6 (six) hours as needed for wheezing or shortness of breath., Disp: , Rfl:  .  atorvastatin (LIPITOR) 40 MG tablet, Take 1 tablet (40 mg total) by mouth daily., Disp: 30 tablet, Rfl: 0 .  Azelastine-Fluticasone (DYMISTA) 137-50 MCG/ACT SUSP, Use one spray in each nostril twice a day as directed., Disp: 23 g, Rfl: 5 .  clopidogrel (PLAVIX) 75 MG tablet, Take 1 tablet (75 mg total) by mouth daily with breakfast., Disp: 30 tablet, Rfl: 0 .  diazepam (VALIUM) 5 MG tablet,  Take 0.5 tablets (2.5 mg total) by mouth every 8 (eight) hours as needed for anxiety (VERTIGO)., Disp: 15 tablet, Rfl: 0 .  fluticasone (FLONASE) 50 MCG/ACT nasal spray, Place 1 spray into both nostrils daily., Disp: 16 g, Rfl: 0 .  Ginkgo Biloba 500 MG CAPS, Take by mouth 2 times daily at 12 noon and 4 pm., Disp: , Rfl:  .  HYDROcodone-acetaminophen (NORCO/VICODIN) 5-325 MG tablet, Take 1 tablet by mouth every 6 (six) hours as needed., Disp: 10 tablet, Rfl: 0 .  levocetirizine (XYZAL) 5 MG tablet, Take 5 mg by mouth every evening. , Disp: , Rfl:  .  lidocaine (LIDODERM) 5 %, Place 1 patch onto the skin daily. Remove & Discard patch within 12 hours or as directed by MD, Disp: 15 patch, Rfl: 0 .  meclizine (ANTIVERT) 25 MG tablet, Take 1 tablet (25 mg total) by mouth 3 (three) times daily as needed for dizziness., Disp: 30 tablet, Rfl: 0 .  olmesartan (BENICAR) 20 MG tablet, Take 20 mg by mouth daily., Disp: , Rfl: 1 .  OVER THE COUNTER MEDICATION, , Disp: , Rfl:   Current Facility-Administered Medications:  .  0.9 %  sodium chloride infusion, 500 mL, Intravenous, Once, [name], MD   Review of Systems  Constitutional: Negative.   Respiratory: Negative.   Cardiovascular: Negative.   Skin: Negative.   Neurological: Positive for dizziness and vertigo.  Forgetting where she has placed certain items and her son feels she is forgetting more things.     Vitals:   11/05/17 1621  BP: (!) 142/96  Pulse: 94  Temp: 98 F (36.7 C)  SpO2: 95%   Vitals:   11/05/17 1621  Weight: 180 lb 6.4 oz (81.8 kg)  Height: 5' 3.5" (1.613 m)     Objective:  Physical Exam  Constitutional: She is oriented to person, place, and time. She appears well-developed and well-nourished.  Pulmonary/Chest: Effort normal.  Neurological: She is alert and oriented to person, place, and time.  At times has difficulty with word finding.   Skin: Skin is warm and dry.  Psychiatric: She has a normal mood and  affect.          Assessment And Plan:     1. Vertigo as late effect of cerebrovascular accident (CVA)  Discussed that her vertigo is long term and she can take the valium at bedtime more regularly   Take the meclizine during the day  Daughter was present during visit.  - diazepam (VALIUM) 5 MG tablet; Take 0.5 tablets (2.5 mg total) by mouth every 8 (eight) hours as needed for anxiety (VERTIGO).  Dispense: 15 tablet; Refill: 0  2. Memory loss  Will start her on aricept daily and follow up in 6 weeks   Discussed side effects of hypotension and nausea, generally subsides after couple weeks. - donepezil (ARICEPT) 5 MG tablet; Take 1 tablet (5 mg total) by mouth at bedtime.  Dispense: 30 tablet; Refill: 2  3. Need for influenza vaccination  Influenza vaccine administered  Encouraged to take Tylenol as needed for fever or muscle aches. - Flu vaccine HIGH DOSE PF (Fluzone High dose)       [name], FNP

## 2017-11-05 NOTE — Patient Instructions (Signed)
Request copy of most recent office visit.

## 2017-11-12 ENCOUNTER — Ambulatory Visit: Payer: Medicare Other | Admitting: Internal Medicine

## 2017-12-01 ENCOUNTER — Other Ambulatory Visit: Payer: Self-pay | Admitting: Nurse Practitioner

## 2017-12-10 ENCOUNTER — Ambulatory Visit (INDEPENDENT_AMBULATORY_CARE_PROVIDER_SITE_OTHER): Payer: Medicare Other | Admitting: Nurse Practitioner

## 2017-12-10 ENCOUNTER — Encounter: Payer: Self-pay | Admitting: Nurse Practitioner

## 2017-12-10 VITALS — BP 132/88 | HR 66 | Temp 97.3°F | Ht 63.5 in | Wt 179.6 lb

## 2017-12-10 DIAGNOSIS — K5900 Constipation, unspecified: Secondary | ICD-10-CM

## 2017-12-10 DIAGNOSIS — H9193 Unspecified hearing loss, bilateral: Secondary | ICD-10-CM

## 2017-12-10 DIAGNOSIS — R413 Other amnesia: Secondary | ICD-10-CM

## 2017-12-10 DIAGNOSIS — R202 Paresthesia of skin: Secondary | ICD-10-CM | POA: Diagnosis not present

## 2017-12-10 NOTE — Progress Notes (Signed)
Subjective:     Patient ID: Donna Gilmore , female    DOB: [birth year] , 77 y.o.   MRN: [id number]   Chief Complaint  Patient presents with  . Med check    HPI  Memory problems - She has been taking the donepezil at night and will have vivid dreams. She denies nausea at this time with the medications.    She is having problems with having problems with bowel movement, she will have accidents when she passes gas and at times she feels like needs to have bowel movement but can't go.  She is drinking approximately 70 ounces of water daily.      Past Medical History:  Diagnosis Date  . Benign paroxysmal positional vertigo 08/26/2012  . CVA (cerebral infarction) 08/25/2012  . Dyslipidemia 08/25/2012  . Essential hypertension, benign 08/25/2012  . Hyperlipidemia   . Hypertension   . Stroke [facility])      Family History  Problem Relation Age of Onset  . Kidney failure Mother   . Diabetes Mother   . Alzheimer's disease Mother   . Alzheimer's disease Father   . Prostate cancer Father   . Diabetes Maternal Grandmother   . Diabetes Maternal Grandfather   . Alzheimer's disease Paternal Grandmother   . Alzheimer's disease Paternal Grandfather   . Prostate cancer Paternal Uncle      Current Outpatient Medications:  .  albuterol (PROAIR HFA) 108 (90 Base) MCG/ACT inhaler, Inhale 2 puffs into the lungs every 6 (six) hours as needed for wheezing or shortness of breath., Disp: , Rfl:  .  atorvastatin (LIPITOR) 40 MG tablet, Take 1 tablet (40 mg total) by mouth daily., Disp: 30 tablet, Rfl: 0 .  clopidogrel (PLAVIX) 75 MG tablet, TAKE 1 TABLET BY MOUTH  EVERY DAY, Disp: 90 tablet, Rfl: 0 .  diazepam (VALIUM) 5 MG tablet, Take 0.5 tablets (2.5 mg total) by mouth every 8 (eight) hours as needed for anxiety (VERTIGO)., Disp: 15 tablet, Rfl: 0 .  donepezil (ARICEPT) 5 MG tablet, Take 1 tablet (5 mg total) by mouth at bedtime., Disp: 30 tablet, Rfl: 2 .  fluticasone (FLONASE) 50 MCG/ACT nasal  spray, Place 1 spray into both nostrils daily., Disp: 16 g, Rfl: 0 .  levocetirizine (XYZAL) 5 MG tablet, Take 5 mg by mouth every evening. , Disp: , Rfl:  .  meclizine (ANTIVERT) 25 MG tablet, Take 1 tablet (25 mg total) by mouth 3 (three) times daily as needed for dizziness., Disp: 30 tablet, Rfl: 0 .  olmesartan (BENICAR) 20 MG tablet, Take 20 mg by mouth daily., Disp: , Rfl: 1 .  OVER THE COUNTER MEDICATION, , Disp: , Rfl:   Current Facility-Administered Medications:  .  0.9 %  sodium chloride infusion, 500 mL, Intravenous, Once, [name], MD   No Known Allergies   Review of Systems  Respiratory: Negative.   Cardiovascular: Negative.   Gastrointestinal: Negative for constipation, diarrhea and nausea.  Musculoskeletal: Negative.   Skin: Negative.   Neurological: Negative.   Hematological: Negative.      Today's Vitals   12/10/17 1601  BP: 132/88  Pulse: 66  Temp: (!) 97.3 F (36.3 C)  TempSrc: Oral  SpO2: 95%  Weight: 179 lb 9.6 oz (81.5 kg)  Height: 5' 3.5" (1.613 m)  PainSc: 0-No pain   Body mass index is 31.32 kg/m.   Objective:  Physical Exam  Constitutional: She is oriented to person, place, and time. She appears well-developed and well-nourished.  HENT:  Ears:  Cardiovascular: Normal rate, regular rhythm and normal heart sounds.  Pulmonary/Chest: Effort normal and breath sounds normal.  Abdominal: Soft. Bowel sounds are normal. She exhibits distension. She exhibits no mass. There is generalized tenderness. There is no guarding.  Musculoskeletal: Normal range of motion. She exhibits no tenderness.  Neurological: She is alert and oriented to person, place, and time.  Skin: Skin is warm and dry. Capillary refill takes less than 2 seconds.  Psychiatric: She has a normal mood and affect. Her behavior is normal.        Assessment And Plan:    1. Difficulty passing stool  Will provide her with Linzess to see if she is having constipation.  If symptoms  are not better will consider checking a KUB   2. Memory loss  Chronic  Memory is about the same, she is having vivid dreams will try her taking the donepezil in the morning and consider increasing the dose at the next visit if tolerating well.  3. Tingling in extremities  Reports continued tingling and numbness to left foot  Will check for EMG/NCV  Encouraged to take Vitamin B complex and Magnesium supplement - Ambulatory referral to Neurology  4. Bilateral hearing loss, unspecified hearing loss type  Reports she has seen the hearing doctor and he would like to refer her to an ENT  Advised her and her son to contact that provider for him to make the referral.   She continues to wear bilateral hearing aids.  I answered all of the patients and son's questions during the visit.    [name], FNP

## 2017-12-10 NOTE — Patient Instructions (Addendum)
   TAKE VITAMIN B COMPLEX VITAMINS DAILY TO HELP WITH TINGLING  TAKE MAGNESIUM 250 MG DAILY WITH EVENING MEAL.    CALL IF YOUR SYMPTOMS ARE NOT BETTER IN THE NEXT 2 WEEKS.  TAKE LINZESS ONCE DAILY IF LOOSE BOWELS YOU MAY TAKE EVERY OTHER DAY

## 2017-12-12 ENCOUNTER — Ambulatory Visit: Payer: Medicare Other | Admitting: Nurse Practitioner

## 2017-12-16 LAB — HM DIABETES EYE EXAM

## 2017-12-20 ENCOUNTER — Other Ambulatory Visit: Payer: Self-pay | Admitting: Nurse Practitioner

## 2017-12-22 ENCOUNTER — Other Ambulatory Visit: Payer: Self-pay | Admitting: Nurse Practitioner

## 2017-12-29 ENCOUNTER — Other Ambulatory Visit: Payer: Self-pay

## 2017-12-29 MED ORDER — GABAPENTIN 100 MG PO CAPS: 100.0000 mg | ORAL_CAPSULE | Freq: Three times a day (TID) | ORAL | 1 refills | Status: DC | PRN

## 2018-01-12 ENCOUNTER — Encounter: Payer: Self-pay | Admitting: Neurology

## 2018-01-12 ENCOUNTER — Encounter

## 2018-01-12 ENCOUNTER — Ambulatory Visit: Payer: Medicare Other | Admitting: Neurology

## 2018-01-12 ENCOUNTER — Ambulatory Visit (INDEPENDENT_AMBULATORY_CARE_PROVIDER_SITE_OTHER): Payer: Medicare Other | Admitting: Neurology

## 2018-01-12 DIAGNOSIS — G5712 Meralgia paresthetica, left lower limb: Secondary | ICD-10-CM

## 2018-01-12 DIAGNOSIS — R202 Paresthesia of skin: Secondary | ICD-10-CM | POA: Diagnosis not present

## 2018-01-12 HISTORY — DX: Meralgia paresthetica, left lower limb: G57.12

## 2018-01-12 NOTE — Progress Notes (Signed)
Please refer to EMG and nerve conduction procedure note.  

## 2018-01-12 NOTE — Procedures (Signed)
     HISTORY:  Donna Gilmore is a 77 year old patient with borderline diabetes and obesity who reports some numbness in the feet bilaterally.  Over the last 2 months she has noted dysesthesias in the anterolateral aspect of the left thigh.  The patient is being evaluated for a possible neuropathy or a lumbosacral radiculopathy.  NERVE CONDUCTION STUDIES:  Nerve conduction studies were performed on both lower extremities. The distal motor latencies and motor amplitudes for the peroneal and posterior tibial nerves were within normal limits. The nerve conduction velocities for these nerves were also normal. The sensory latencies for the peroneal and sural nerves were within normal limits. The F wave latencies for the posterior tibial nerves were within normal limits.   EMG STUDIES:  EMG study was performed on the left lower extremity:  The tibialis anterior muscle reveals 2 to 4K motor units with full recruitment. No fibrillations or positive waves were seen. The peroneus tertius muscle reveals 2 to 4K motor units with full recruitment. No fibrillations or positive waves were seen. The medial gastrocnemius muscle reveals 1 to 3K motor units with full recruitment. No fibrillations or positive waves were seen. The vastus lateralis muscle reveals 2 to 4K motor units with full recruitment. No fibrillations or positive waves were seen. The iliopsoas muscle reveals 2 to 4K motor units with full recruitment. No fibrillations or positive waves were seen. The biceps femoris muscle (long head) reveals 2 to 4K motor units with full recruitment. No fibrillations or positive waves were seen. The lumbosacral paraspinal muscles were tested at 3 levels, and revealed no abnormalities of insertional activity at all 3 levels tested. There was good relaxation.   IMPRESSION:  Nerve conduction studies done on both lower extremities were unremarkable, no evidence of a peripheral neuropathy was seen.  EMG  evaluation of the left lower extremity was unremarkable, no evidence of an overlying lumbosacral radiculopathy was noted.  The distribution of sensory alteration in the left thigh is most consistent with a meralgia paresthetica.  Jill Alexanders MD 01/12/2018 3:37 PM  [facility] [address] [place], [place] [address]  Phone [phone] Fax [phone]

## 2018-01-13 ENCOUNTER — Encounter: Payer: Self-pay | Admitting: Nurse Practitioner

## 2018-01-13 NOTE — Progress Notes (Signed)
[name]    Nerve / Sites Muscle Latency Ref. Amplitude Ref. Rel Amp Segments Distance Velocity Ref. Area    ms ms mV mV %  cm m/s m/s mVms  R Peroneal - EDB     Ankle EDB 4.4 ?6.5 8.8 ?2.0 100 Ankle - EDB 9   26.5     Fib head EDB 11.3  7.2  81.7 Fib head - Ankle 33 48 ?44 24.5     Pop fossa EDB 13.0  6.6  92.1 Pop fossa - Fib head 10 58 ?44 28.0         Pop fossa - Ankle      L Peroneal - EDB     Ankle EDB 5.1 ?6.5 5.2 ?2.0 100 Ankle - EDB 9   15.0     Fib head EDB 12.0  3.5  68.1 Fib head - Ankle 33 47 ?44 12.8     Pop fossa EDB 14.3  2.2  61.4 Pop fossa - Fib head 10 45 ?44 6.5         Pop fossa - Ankle      R Tibial - AH     Ankle AH 4.8 ?5.8 8.6 ?4.0 100 Ankle - AH 9   23.8     Pop fossa AH 14.0  5.3  61 Pop fossa - Ankle 38 41 ?41 18.1  L Tibial - AH     Ankle AH 5.5 ?5.8 12.5 ?4.0 100 Ankle - AH 9   27.5     Pop fossa AH 14.1  5.8  46.5 Pop fossa - Ankle 38 44 ?41 15.8             SNC    Nerve / Sites Rec. Site Peak Lat Ref.  Amp Ref. Segments Distance    ms ms V V  cm  R Sural - Ankle (Calf)     Calf Ankle 3.4 ?4.4 6 ?6 Calf - Ankle 14  L Sural - Ankle (Calf)     Calf Ankle 3.5 ?4.4 7 ?6 Calf - Ankle 14  R Superficial peroneal - Ankle     Lat leg Ankle 3.8 ?4.4 5 ?6 Lat leg - Ankle 14  L Superficial peroneal - Ankle     Lat leg Ankle 4.0 ?4.4 3 ?6 Lat leg - Ankle 14              F  Wave    Nerve F Lat Ref.   ms ms  R Tibial - AH 54.5 ?56.0  L Tibial - AH 52.9 ?56.0

## 2018-01-22 ENCOUNTER — Other Ambulatory Visit: Payer: Self-pay | Admitting: Nurse Practitioner

## 2018-02-02 ENCOUNTER — Other Ambulatory Visit: Payer: Self-pay

## 2018-02-02 MED ORDER — CLOPIDOGREL BISULFATE 75 MG PO TABS: 75.0000 mg | ORAL_TABLET | Freq: Every day | ORAL | 0 refills | Status: DC

## 2018-02-02 MED ORDER — OLMESARTAN MEDOXOMIL 20 MG PO TABS: 20.0000 mg | ORAL_TABLET | Freq: Every day | ORAL | 1 refills | Status: DC

## 2018-02-06 ENCOUNTER — Other Ambulatory Visit: Payer: Self-pay | Admitting: Nurse Practitioner

## 2018-02-06 ENCOUNTER — Telehealth: Payer: Self-pay | Admitting: Nurse Practitioner

## 2018-02-06 MED ORDER — PREDNISONE 10 MG (21) PO TBPK: ORAL_TABLET | ORAL | 0 refills | Status: DC

## 2018-02-06 NOTE — Telephone Encounter (Signed)
Discussed with her daughter Billey Co the results of her NCV/EMG, she does not have peripheral neuropathy, her results are consistent with meralgia parasthetica, I have sent a Rx for prednisone taper and she is encouraged to not wear tight bands around her waist.

## 2018-03-04 ENCOUNTER — Other Ambulatory Visit: Payer: Self-pay

## 2018-03-04 ENCOUNTER — Ambulatory Visit (INDEPENDENT_AMBULATORY_CARE_PROVIDER_SITE_OTHER): Payer: Medicare Other | Admitting: Internal Medicine

## 2018-03-04 ENCOUNTER — Encounter: Payer: Self-pay | Admitting: Internal Medicine

## 2018-03-04 VITALS — BP 132/78 | HR 67 | Temp 97.5°F | Ht 65.0 in | Wt 180.2 lb

## 2018-03-04 DIAGNOSIS — I10 Essential (primary) hypertension: Secondary | ICD-10-CM

## 2018-03-04 DIAGNOSIS — H6981 Other specified disorders of Eustachian tube, right ear: Secondary | ICD-10-CM

## 2018-03-04 DIAGNOSIS — G5712 Meralgia paresthetica, left lower limb: Secondary | ICD-10-CM

## 2018-03-04 DIAGNOSIS — E7800 Pure hypercholesterolemia, unspecified: Secondary | ICD-10-CM

## 2018-03-04 DIAGNOSIS — H6991 Unspecified Eustachian tube disorder, right ear: Secondary | ICD-10-CM

## 2018-03-04 MED ORDER — ALBUTEROL SULFATE HFA 108 (90 BASE) MCG/ACT IN AERS: 2.0000 | INHALATION_SPRAY | Freq: Four times a day (QID) | RESPIRATORY_TRACT | 1 refills | Status: DC | PRN

## 2018-03-04 NOTE — Patient Instructions (Addendum)
USE THE FLONASE FOR 7 DAYS for  FLUID IN YOUR RIGHT EAR CALL EXTENSION 208 IF YOU HAVE QUESTIONS FOR YOUR PROVIDER    Eustachian Tube Dysfunction  Eustachian tube dysfunction refers to a condition in which a blockage develops in the narrow passage that connects the middle ear to the back of the nose (eustachian tube). The eustachian tube regulates air pressure in the middle ear by letting air move between the ear and nose. It also helps to drain fluid from the middle ear space. Eustachian tube dysfunction can affect one or both ears. When the eustachian tube does not function properly, air pressure, fluid, or both can build up in the middle ear. What are the causes? This condition occurs when the eustachian tube becomes blocked or cannot open normally. Common causes of this condition include:  Ear infections.  Colds and other infections that affect the nose, mouth, and throat (upper respiratory tract).  Allergies.  Irritation from cigarette smoke.  Irritation from stomach acid coming up into the esophagus (gastroesophageal reflux). The esophagus is the tube that carries food from the mouth to the stomach.  Sudden changes in air pressure, such as from descending in an airplane or scuba diving.  Abnormal growths in the nose or throat, such as: ? Growths that line the nose (nasal polyps). ? Abnormal growth of cells (tumors). ? Enlarged tissue at the back of the throat (adenoids). What increases the risk? You are more likely to develop this condition if:  You smoke.  You are overweight.  You are a child who has: ? Certain birth defects of the mouth, such as cleft palate. ? Large tonsils or adenoids. What are the signs or symptoms? Common symptoms of this condition include:  A feeling of fullness in the ear.  Ear pain.  Clicking or popping noises in the ear.  Ringing in the ear.  Hearing loss.  Loss of balance.  Dizziness. Symptoms may get worse when the air pressure  around you changes, such as when you travel to an area of high elevation, fly on an airplane, or go scuba diving. How is this diagnosed? This condition may be diagnosed based on:  Your symptoms.  A physical exam of your ears, nose, and throat.  Tests, such as those that measure: ? The movement of your eardrum (tympanogram). ? Your hearing (audiometry). How is this treated? Treatment depends on the cause and severity of your condition.  In mild cases, you may relieve your symptoms by moving air into your ears. This is called "popping the ears."  In more severe cases, or if you have symptoms of fluid in your ears, treatment may include: ? Medicines to relieve congestion (decongestants). ? Medicines that treat allergies (antihistamines). ? Nasal sprays or ear drops that contain medicines that reduce swelling (steroids). ? A procedure to drain the fluid in your eardrum (myringotomy). In this procedure, a small tube is placed in the eardrum to:  Drain the fluid.  Restore the air in the middle ear space. ? A procedure to insert a balloon device through the nose to inflate the opening of the eustachian tube (balloon dilation). Follow these instructions at home: Lifestyle  Do not do any of the following until your health care provider approves: ? Travel to high altitudes. ? Fly in airplanes. ? Work in a P[employer] scheme manager or room. ? Scuba dive.  Do not use any products that contain nicotine or tobacco, such as cigarettes and e-cigarettes. If you need help quitting, ask  your health care provider.  Keep your ears dry. Wear fitted earplugs during showering and bathing. Dry your ears completely after. General instructions  Take over-the-counter and prescription medicines only as told by your health care provider.  Use techniques to help pop your ears as recommended by your health care provider. These may include: ? Chewing gum. ? Yawning. ? Frequent, forceful swallowing. ? Closing  your mouth, holding your nose closed, and gently blowing as if you are trying to blow air out of your nose.  Keep all follow-up visits as told by your health care provider. This is important. Contact a health care provider if:  Your symptoms do not go away after treatment.  Your symptoms come back after treatment.  You are unable to pop your ears.  You have: ? A fever. ? Pain in your ear. ? Pain in your head or neck. ? Fluid draining from your ear.  Your hearing suddenly changes.  You become very dizzy.  You lose your balance. Summary  Eustachian tube dysfunction refers to a condition in which a blockage develops in the eustachian tube.  It can be caused by ear infections, allergies, inhaled irritants, or abnormal growths in the nose or throat.  Symptoms include ear pain, hearing loss, or ringing in the ears.  Mild cases are treated with maneuvers to unblock the ears, such as yawning or ear popping.  Severe cases are treated with medicines. Surgery may also be done (rare). This information is not intended to replace advice given to you by your health care provider. Make sure you discuss any questions you have with your health care provider. Document Released: 02/10/2015 Document Revised: 05/06/2017 Document Reviewed: 05/06/2017 Elsevier Interactive Patient Education  2019 [employer] a written record of your sugar and blood pressure on the days you get lightheaded in the mornings and bring it with your to the next visit.

## 2018-03-04 NOTE — Progress Notes (Addendum)
Subjective:     Patient ID: Donna Gilmore , female    DOB: 07/02/1940 , 78 y.o.   MRN: [id number]   Chief Complaint  Patient presents with  . Hyperlipidemia    HPI 1-FU HIGH CHOLESTEROL  2- She has hx of chronic vertigo and takes meclezine FOR YEARS AND DOES NOT FEEL IS HELPING. SPECIALLY WHEN SHE GETS UP IN AM FEELS  Off balance and does not know if is her sinuses or her chronic vertigo, but there is no spinning. Her BP's whensh echeckes it is [id number] systolic. This am her glucose was 113. She did not feel light headed this am. She takes her BP med around noon most times.   Around noon finally the light headness passes. She did not have it today. Has apt with ENT this month due to audiologist noticing a lot of hearing loss from R ear.   3- She never heard back about her nerve conduction study, so would like me to review this with her. She used to have a lot of back pain, stiffness and joint pains, but since she went to a chiropractor for 2 months last year, she got better. She will start monthly maintenance visits this month.   Past Medical History:  Diagnosis Date  . Benign paroxysmal positional vertigo 08/26/2012  . CVA (cerebral infarction) 08/25/2012  . Dyslipidemia 08/25/2012  . Essential hypertension, benign 08/25/2012  . Hyperlipidemia   . Hypertension   . Meralgia paraesthetica, left 01/12/2018  . Stroke [facility])      Family History  Problem Relation Age of Onset  . Kidney failure Mother   . Diabetes Mother   . Alzheimer's disease Mother   . Alzheimer's disease Father   . Prostate cancer Father   . Diabetes Maternal Grandmother   . Diabetes Maternal Grandfather   . Alzheimer's disease Paternal Grandmother   . Alzheimer's disease Paternal Grandfather   . Prostate cancer Paternal Uncle      Current Outpatient Medications:  .  albuterol (PROAIR HFA) 108 (90 Base) MCG/ACT inhaler, Inhale 2 puffs into the lungs every 6 (six) hours as needed for wheezing or shortness of  breath., Disp: , Rfl:  .  atorvastatin (LIPITOR) 40 MG tablet, TAKE 1 TABLET BY MOUTH  EVERY DAY AT BEDTIME, Disp: 90 tablet, Rfl: 1 .  clopidogrel (PLAVIX) 75 MG tablet, Take 1 tablet (75 mg total) by mouth daily., Disp: 90 tablet, Rfl: 0 .  diazepam (VALIUM) 5 MG tablet, Take 0.5 tablets (2.5 mg total) by mouth every 8 (eight) hours as needed for anxiety (VERTIGO)., Disp: 15 tablet, Rfl: 0 .  donepezil (ARICEPT) 5 MG tablet, Take 1 tablet (5 mg total) by mouth at bedtime., Disp: 30 tablet, Rfl: 2 .  fluticasone (FLONASE) 50 MCG/ACT nasal spray, Place 1 spray into both nostrils daily., Disp: 16 g, Rfl: 0 .  levocetirizine (XYZAL) 5 MG tablet, TAKE 1 TABLET BY MOUTH  EVERY DAY IN THE EVENING, Disp: 90 tablet, Rfl: 1 .  meclizine (ANTIVERT) 25 MG tablet, TAKE 1 TABLET BY MOUTH 2  TIMES EVERY DAY AS NEEDED, Disp: 90 tablet, Rfl: 0 .  olmesartan (BENICAR) 20 MG tablet, Take 1 tablet (20 mg total) by mouth daily., Disp: 90 tablet, Rfl: 1 .  OVER THE COUNTER MEDICATION, , Disp: , Rfl:  .  gabapentin (NEURONTIN) 100 MG capsule, Take 1 capsule (100 mg total) by mouth 3 (three) times daily as needed. (Patient not taking: Reported on 03/04/2018), Disp: 30 capsule, Rfl: 1 .  predniSONE (STERAPRED UNI-PAK 21 TAB) 10 MG (21) TBPK tablet, Take as directed (Patient not taking: Reported on 03/04/2018), Disp: 21 tablet, Rfl: 0  Current Facility-Administered Medications:  .  0.9 %  sodium chloride infusion, 500 mL, Intravenous, Once, [name], MD   No Known Allergies   Review of Systems  Respiratory: Negative for cough, chest tightness, shortness of breath and wheezing.   Cardiovascular: Negative for chest pain, palpitations and leg swelling.  Allergic/Immunologic: Positive for environmental allergies.  Neurological: Positive for light-headedness.     Today's Vitals   03/04/18 1012  BP: 132/78  Pulse: 67  Temp: (!) 97.5 F (36.4 C)  TempSrc: Oral  SpO2: 97%  Weight: 180 lb 3.2 oz (81.7 kg)  Height:  5\' 5"  (1.651 m)   Body mass index is 29.99 kg/m.   Objective:  Physical Exam   Constitutional: She is oriented to person, place, and time. She appears well-developed and well-nourished. No distress. She is hard of hearing.  HENT: R TM is dull and gray. L TM is gray and shiny. Nose- normal. Pharynx- clear. Head: Normocephalic and atraumatic.  Right Ear: External ear normal.  Left Ear: External ear normal.  Nose: Nose normal.  Eyes: Conjunctivae are normal. Right eye exhibits no discharge. Left eye exhibits no discharge. No scleral icterus.  Neck: Neck supple. No thyromegaly present.  No carotid bruits bilaterally  Cardiovascular: Normal rate and regular rhythm.  No murmur heard. Pulmonary/Chest: Effort normal and breath sounds normal. No respiratory distress.  Musculoskeletal: Normal range of motion. She exhibits no edema.  Lymphadenopathy: She has no cervical adenopathy.  Neurological: She is alert and oriented to person, place, and time. She was a little shakier with [place], but did well with tandem gait as long as her hands were extended.  Skin: Skin is warm and dry. Capillary refill takes less than 2 seconds. No rash noted. She is not diaphoretic.  Psychiatric: She has a normal mood and affect. Her behavior is normal. Judgment and thought content normal.  Nursing note reviewed.   Assessment And Plan:     1. Pure hypercholesterolemia- chronic. May continue same meds.  - Lipid Profile  2. Essential hypertension- stable. May continue same meds.  - CMP14 + Anion Gap - CBC no Diff  3. Dysfunction of right eustachian tube- could be what is causing her vertigo to flair,  advised to try Flonase nose spray x 7 d.   4. Meralgia paraesthetica, left- chronic. No action.   FU in 3 months with [facility], PA-C

## 2018-03-05 ENCOUNTER — Ambulatory Visit: Payer: Medicare Other | Admitting: Nurse Practitioner

## 2018-03-05 LAB — CBC
Hematocrit: 39.5 % (ref 34.0–46.6)
Hemoglobin: 12.7 g/dL (ref 11.1–15.9)
MCH: 28.2 pg (ref 26.6–33.0)
MCHC: 32.2 g/dL (ref 31.5–35.7)
MCV: 88 fL (ref 79–97)
PLATELETS: 260 10*3/uL (ref 150–450)
RBC: 4.51 x10E6/uL (ref 3.77–5.28)
RDW: 14.3 % (ref 11.7–15.4)
WBC: 5.8 10*3/uL (ref 3.4–10.8)

## 2018-03-05 LAB — LIPID PANEL
Chol/HDL Ratio: 5.1 ratio — ABNORMAL HIGH (ref 0.0–4.4)
Cholesterol, Total: 241 mg/dL — ABNORMAL HIGH (ref 100–199)
HDL: 47 mg/dL (ref >39)
LDL Calculated: 120 mg/dL — ABNORMAL HIGH (ref 0–99)
Triglycerides: 369 mg/dL — ABNORMAL HIGH (ref 0–149)
VLDL Cholesterol Cal: 74 mg/dL — ABNORMAL HIGH (ref 5–40)

## 2018-03-05 LAB — CMP14 + ANION GAP
ALK PHOS: 106 IU/L (ref 39–117)
ALT: 23 IU/L (ref 0–32)
AST: 29 IU/L (ref 0–40)
Albumin/Globulin Ratio: 1.6 (ref 1.2–2.2)
Albumin: 4.4 g/dL (ref 3.7–4.7)
Anion Gap: 21 mmol/L — ABNORMAL HIGH (ref 10.0–18.0)
BUN/Creatinine Ratio: 6 — ABNORMAL LOW (ref 12–28)
BUN: 6 mg/dL — ABNORMAL LOW (ref 8–27)
Bilirubin Total: 0.3 mg/dL (ref 0.0–1.2)
CO2: 19 mmol/L — ABNORMAL LOW (ref 20–29)
CREATININE: 0.98 mg/dL (ref 0.57–1.00)
Calcium: 9.9 mg/dL (ref 8.7–10.3)
Chloride: 107 mmol/L — ABNORMAL HIGH (ref 96–106)
GFR calc Af Amer: 64 mL/min/{1.73_m2} (ref >59)
GFR calc non Af Amer: 56 mL/min/{1.73_m2} — ABNORMAL LOW (ref >59)
Globulin, Total: 2.7 g/dL (ref 1.5–4.5)
Glucose: 95 mg/dL (ref 65–99)
Potassium: 4.9 mmol/L (ref 3.5–5.2)
Sodium: 147 mmol/L — ABNORMAL HIGH (ref 134–144)
Total Protein: 7.1 g/dL (ref 6.0–8.5)

## 2018-03-12 ENCOUNTER — Other Ambulatory Visit: Payer: Self-pay | Admitting: Internal Medicine

## 2018-03-12 MED ORDER — ATORVASTATIN CALCIUM 80 MG PO TABS: 80.0000 mg | ORAL_TABLET | Freq: Every day | ORAL | 0 refills | Status: DC

## 2018-03-18 DIAGNOSIS — H9313 Tinnitus, bilateral: Secondary | ICD-10-CM | POA: Insufficient documentation

## 2018-03-25 ENCOUNTER — Telehealth: Payer: Self-pay | Admitting: Internal Medicine

## 2018-03-25 NOTE — Telephone Encounter (Signed)
Pt called and states that her lipitor is too large to swallow. Please advise

## 2018-03-27 ENCOUNTER — Telehealth: Payer: Self-pay | Admitting: Nurse Practitioner

## 2018-03-27 NOTE — Telephone Encounter (Signed)
Returned call about her lipitor being too big, she has a pill splitter.  I have advised her to do that to help be able to swallow better.

## 2018-04-05 ENCOUNTER — Other Ambulatory Visit: Payer: Self-pay | Admitting: Nurse Practitioner

## 2018-04-14 ENCOUNTER — Other Ambulatory Visit: Payer: Self-pay | Admitting: Nurse Practitioner

## 2018-04-14 ENCOUNTER — Other Ambulatory Visit: Payer: Self-pay | Admitting: Internal Medicine

## 2018-04-24 ENCOUNTER — Other Ambulatory Visit: Payer: Self-pay | Admitting: Nurse Practitioner

## 2018-04-30 ENCOUNTER — Ambulatory Visit: Payer: Medicare Other | Admitting: Nurse Practitioner

## 2018-05-01 ENCOUNTER — Telehealth: Payer: Self-pay | Admitting: Nurse Practitioner

## 2018-05-01 NOTE — Telephone Encounter (Signed)
PT CONSENT TO TELEPHONE VISIT FOR MONDAY 05/04/18@1130 . PT AWARE TO BE READY FOR CALL 15 MINS EARLY

## 2018-05-04 ENCOUNTER — Ambulatory Visit (INDEPENDENT_AMBULATORY_CARE_PROVIDER_SITE_OTHER): Payer: Medicare Other | Admitting: Nurse Practitioner

## 2018-05-04 ENCOUNTER — Other Ambulatory Visit: Payer: Self-pay

## 2018-05-04 ENCOUNTER — Ambulatory Visit: Payer: Medicare Other | Admitting: Nurse Practitioner

## 2018-05-04 VITALS — BP 148/88 | HR 68 | Wt 177.1 lb

## 2018-05-04 DIAGNOSIS — I10 Essential (primary) hypertension: Secondary | ICD-10-CM

## 2018-05-04 DIAGNOSIS — R0602 Shortness of breath: Secondary | ICD-10-CM

## 2018-05-04 DIAGNOSIS — M5442 Lumbago with sciatica, left side: Secondary | ICD-10-CM | POA: Diagnosis not present

## 2018-05-04 HISTORY — DX: Lumbago with sciatica, left side: M54.42

## 2018-05-04 HISTORY — DX: Shortness of breath: R06.02

## 2018-05-04 MED ORDER — PREDNISONE 5 MG (21) PO TBPK: ORAL_TABLET | ORAL | 0 refills | Status: DC

## 2018-05-04 NOTE — Progress Notes (Signed)
This visit type was conducted due to national recommendations for restrictions regarding the COVID-19 Pandemic (e.g. social distancing).  This format is felt to be most appropriate for this patient at this time.  All issues noted in this document were discussed and addressed.  No physical exam was performed (except for noted visual exam findings with Video Visits).  Please refer to the patient's chart (MyChart message for video visits and phone note for telephone visits) for the patient's consent to telehealth for [place].   Subjective:     Patient ID: Donna Gilmore , female    DOB: February 02, 1940 , 78 y.o.   MRN: [id number]  Virtual Visit via Telephone Note  I connected with Donna Gilmore on 05/04/18 at 11:30 AM EDT by telephone and verified that I am speaking with the correct person using two identifiers.   I discussed the limitations, risks, security and privacy concerns of performing an evaluation and management service by telephone and the availability of in person appointments. I also discussed with the patient that there may be a patient responsible charge related to this service. The patient expressed understanding and agreed to proceed.  Patient location: Home  Provider location:  Office Chief Complaint  Patient presents with  . Back Pain    History of Present Illness:   Her son gave her CBD spray.  If she sits for a couple of hours.  Left leg staying numb most of the time.  She feels like she has gained weight in her waist.  She is unable to do any exercise.  A different type of mattress was purchased earlier.      Back Pain  This is a recurrent problem. The current episode started more than 1 month ago. The pain is present in the lumbar spine. The quality of the pain is described as aching. The pain is at a severity of 9/10. The pain is severe. The symptoms are aggravated by sitting. Associated symptoms include numbness (down left lower extremity). Pertinent negatives include no  abdominal pain, chest pain or paresthesias.  Shortness of Breath  This is a new problem. The problem occurs intermittently. Associated symptoms include wheezing. Pertinent negatives include no abdominal pain, chest pain, leg swelling or sore throat.     Past Medical History:  Diagnosis Date  . Benign paroxysmal positional vertigo 08/26/2012  . CVA (cerebral infarction) 08/25/2012  . Dyslipidemia 08/25/2012  . Essential hypertension, benign 08/25/2012  . Hyperlipidemia   . Hypertension   . Meralgia paraesthetica, left 01/12/2018  . Stroke [facility])      Family History  Problem Relation Age of Onset  . Kidney failure Mother   . Diabetes Mother   . Alzheimer's disease Mother   . Alzheimer's disease Father   . Prostate cancer Father   . Diabetes Maternal Grandmother   . Diabetes Maternal Grandfather   . Alzheimer's disease Paternal Grandmother   . Alzheimer's disease Paternal Grandfather   . Prostate cancer Paternal Uncle      Current Outpatient Medications:  .  albuterol (PROAIR HFA) 108 (90 Base) MCG/ACT inhaler, Inhale 2 puffs into the lungs every 6 (six) hours as needed for wheezing or shortness of breath., Disp: 2 Inhaler, Rfl: 1 .  atorvastatin (LIPITOR) 80 MG tablet, TAKE 1 TABLET BY MOUTH  DAILY, Disp: 90 tablet, Rfl: 0 .  clopidogrel (PLAVIX) 75 MG tablet, TAKE 1 TABLET BY MOUTH  DAILY, Disp: 90 tablet, Rfl: 0 .  diazepam (VALIUM) 5 MG tablet, Take 0.5 tablets (  2.5 mg total) by mouth every 8 (eight) hours as needed for anxiety (VERTIGO)., Disp: 15 tablet, Rfl: 0 .  donepezil (ARICEPT) 5 MG tablet, Take 1 tablet (5 mg total) by mouth at bedtime., Disp: 30 tablet, Rfl: 2 .  fluticasone (FLONASE) 50 MCG/ACT nasal spray, Place 1 spray into both nostrils daily., Disp: 16 g, Rfl: 0 .  levocetirizine (XYZAL) 5 MG tablet, TAKE 1 TABLET BY MOUTH  EVERY DAY IN THE EVENING, Disp: 90 tablet, Rfl: 1 .  meclizine (ANTIVERT) 25 MG tablet, TAKE 1 TABLET BY MOUTH 2  TIMES EVERY DAY AS NEEDED,  Disp: 90 tablet, Rfl: 0 .  olmesartan (BENICAR) 20 MG tablet, Take 1 tablet (20 mg total) by mouth daily., Disp: 90 tablet, Rfl: 1 .  OVER THE COUNTER MEDICATION, , Disp: , Rfl:  .  gabapentin (NEURONTIN) 100 MG capsule, Take 1 capsule (100 mg total) by mouth 3 (three) times daily as needed. (Patient not taking: Reported on 03/04/2018), Disp: 30 capsule, Rfl: 1 .  predniSONE (STERAPRED UNI-PAK 21 TAB) 10 MG (21) TBPK tablet, Take as directed (Patient not taking: Reported on 03/04/2018), Disp: 21 tablet, Rfl: 0  Current Facility-Administered Medications:  .  0.9 %  sodium chloride infusion, 500 mL, Intravenous, Once, [name], MD   No Known Allergies   Review of Systems  Constitutional: Negative for fatigue.  HENT: Negative for sore throat.   Respiratory: Positive for shortness of breath (when she walks at times) and wheezing. Negative for cough.   Cardiovascular: Negative for chest pain, palpitations and leg swelling.  Gastrointestinal: Negative for abdominal pain.  Musculoskeletal: Positive for back pain.  Neurological: Positive for numbness (down left lower extremity). Negative for dizziness and paresthesias.     Today's Vitals   05/04/18 1148  BP: (!) 148/88  Pulse: 68  Weight: 177 lb 1.6 oz (80.3 kg)    Observations/Objective: Via phone, patient is able to speak in complete sentences She is extremely hard of hearing        Assessment and Plan: 1. Acute left-sided low back pain with left-sided sciatica  Will treat with prednisone for the inflammation  Encouraged to use heating pad on low and to try and stretch safely  - predniSONE (STERAPRED UNI-PAK 21 TAB) 5 MG (21) TBPK tablet; Take as directed  Dispense: 21 tablet; Refill: 0  2. Shortness of breath  Reported using albuterol inhaler occasionally, denies wheezing.  She will be taking prednisone taper which should help however I have advised her if this does not improve will need to go to ER/Urgent care for further  evaluation may need a CXR  3. Essential hypertension  Elevated this was done at home so unsure of the accuracy  This could be related to pain she is to recheck her blood pressure again when she is more comfortable.   Follow Up Instructions: - return call to office if not better  I discussed the assessment and treatment plan with the patient. The patient was provided an opportunity to ask questions and all were answered. The patient agreed with the plan and demonstrated an understanding of the instructions.   The patient was advised to call back or seek an in-person evaluation if the symptoms worsen or if the condition fails to improve as anticipated.  COVID-19 Education: The signs and symptoms of COVID-19 were discussed with the patient and how to seek care for testing (follow up with PCP or arrange E-visit).  The importance of social distancing was discussed  today.   Patient Risk:   After full review of this patients clinical status, I feel that they are at least moderate risk at this time.   I provided 18 minutes of non-face-to-face time during this encounter.   [name], FNP

## 2018-05-17 ENCOUNTER — Encounter: Payer: Self-pay | Admitting: Nurse Practitioner

## 2018-05-28 ENCOUNTER — Telehealth: Payer: Self-pay

## 2018-05-28 NOTE — Telephone Encounter (Signed)
Patient called stating she is having back pain and leg pain and would like an appointment.   RETURNED HER CALL AND SCHEDULED HER AN OFFICE VISIT. Donna Gilmore

## 2018-06-01 ENCOUNTER — Other Ambulatory Visit: Payer: Self-pay

## 2018-06-01 ENCOUNTER — Ambulatory Visit (INDEPENDENT_AMBULATORY_CARE_PROVIDER_SITE_OTHER): Payer: Medicare Other | Admitting: Nurse Practitioner

## 2018-06-01 ENCOUNTER — Encounter: Payer: Self-pay | Admitting: Nurse Practitioner

## 2018-06-01 VITALS — BP 130/78 | HR 84 | Temp 98.5°F | Ht 62.6 in | Wt 181.4 lb

## 2018-06-01 DIAGNOSIS — M545 Low back pain, unspecified: Secondary | ICD-10-CM | POA: Insufficient documentation

## 2018-06-01 DIAGNOSIS — G8929 Other chronic pain: Secondary | ICD-10-CM | POA: Diagnosis not present

## 2018-06-01 DIAGNOSIS — E6609 Other obesity due to excess calories: Secondary | ICD-10-CM | POA: Diagnosis not present

## 2018-06-01 DIAGNOSIS — Z6832 Body mass index (BMI) 32.0-32.9, adult: Secondary | ICD-10-CM | POA: Diagnosis not present

## 2018-06-01 NOTE — Progress Notes (Signed)
Subjective:     Patient ID: Donna Gilmore , female    DOB: 10/16/1940 , 78 y.o.   MRN: [id number]   Chief Complaint  Patient presents with  . Leg Pain  . Back Pain    HPI  She feels like she has gained weight.  She states "I have to make myself eat".  Breakfast she will eat because she has to take her pills.   Leg Pain   The incident occurred more than 1 week ago. There was no injury mechanism. Pain location: low back pain and moving  Quality: "feels like I have a board" The pain has been constant since onset. Pertinent negatives include no inability to bear weight.  Back Pain  Associated symptoms include leg pain. Pertinent negatives include no chest pain or headaches.     Past Medical History:  Diagnosis Date  . Benign paroxysmal positional vertigo 08/26/2012  . CVA (cerebral infarction) 08/25/2012  . Dyslipidemia 08/25/2012  . Essential hypertension, benign 08/25/2012  . Hyperlipidemia   . Hypertension   . Meralgia paraesthetica, left 01/12/2018  . Stroke [facility])      Family History  Problem Relation Age of Onset  . Kidney failure Mother   . Diabetes Mother   . Alzheimer's disease Mother   . Alzheimer's disease Father   . Prostate cancer Father   . Diabetes Maternal Grandmother   . Diabetes Maternal Grandfather   . Alzheimer's disease Paternal Grandmother   . Alzheimer's disease Paternal Grandfather   . Prostate cancer Paternal Uncle      Current Outpatient Medications:  .  albuterol (PROAIR HFA) 108 (90 Base) MCG/ACT inhaler, Inhale 2 puffs into the lungs every 6 (six) hours as needed for wheezing or shortness of breath., Disp: 2 Inhaler, Rfl: 1 .  diazepam (VALIUM) 5 MG tablet, Take 0.5 tablets (2.5 mg total) by mouth every 8 (eight) hours as needed for anxiety (VERTIGO)., Disp: 15 tablet, Rfl: 0 .  donepezil (ARICEPT) 5 MG tablet, Take 1 tablet (5 mg total) by mouth at bedtime., Disp: 30 tablet, Rfl: 2 .  fluticasone (FLONASE) 50 MCG/ACT nasal spray, Place 1  spray into both nostrils daily., Disp: 16 g, Rfl: 0 .  gabapentin (NEURONTIN) 100 MG capsule, Take 1 capsule (100 mg total) by mouth 3 (three) times daily as needed., Disp: 30 capsule, Rfl: 1 .  levocetirizine (XYZAL) 5 MG tablet, TAKE 1 TABLET BY MOUTH  EVERY DAY IN THE EVENING, Disp: 90 tablet, Rfl: 1 .  meclizine (ANTIVERT) 25 MG tablet, TAKE 1 TABLET BY MOUTH 2  TIMES EVERY DAY AS NEEDED, Disp: 90 tablet, Rfl: 0 .  olmesartan (BENICAR) 20 MG tablet, Take 1 tablet (20 mg total) by mouth daily., Disp: 90 tablet, Rfl: 1 .  OVER THE COUNTER MEDICATION, , Disp: , Rfl:  .  atorvastatin (LIPITOR) 80 MG tablet, TAKE 1 TABLET BY MOUTH  DAILY, Disp: 90 tablet, Rfl: 0 .  clopidogrel (PLAVIX) 75 MG tablet, TAKE 1 TABLET BY MOUTH  DAILY, Disp: 90 tablet, Rfl: 0  Current Facility-Administered Medications:  .  0.9 %  sodium chloride infusion, 500 mL, Intravenous, Once, [name], MD   No Known Allergies   Review of Systems  Constitutional: Negative for appetite change, chills and fatigue.  Eyes: Negative for photophobia.  Respiratory: Negative.   Cardiovascular: Negative.  Negative for chest pain, palpitations and leg swelling.  Gastrointestinal:       She states she is distended.    Endocrine: Negative  for polydipsia, polyphagia and polyuria.  Genitourinary: Negative.   Musculoskeletal: Positive for back pain. Negative for neck pain.  Skin: Negative.  Negative for color change, pallor and rash.  Neurological: Negative for dizziness and headaches.    Today's Vitals   06/01/18 1149  BP: 130/78  Pulse: 84  Temp: 98.5 F (36.9 C)  TempSrc: Oral  SpO2: 96%  Weight: 181 lb 6.4 oz (82.3 kg)  Height: 5' 2.6" (1.59 m)   Body mass index is 32.55 kg/m.   Objective:  Physical Exam Constitutional:      Appearance: Normal appearance.  Cardiovascular:     Rate and Rhythm: Normal rate and regular rhythm.     Pulses: Normal pulses.     Heart sounds: Normal heart sounds. No murmur.   Pulmonary:     Effort: Pulmonary effort is normal.     Breath sounds: Normal breath sounds.  Abdominal:     General: Bowel sounds are normal. There is no distension.     Tenderness: There is no abdominal tenderness.     Comments: Abdomen has central obesity does not look distended  Musculoskeletal:        General: No swelling, tenderness or deformity.     Right lower leg: No edema.     Left lower leg: No edema.     Comments: Negative straight leg raise  Skin:    General: Skin is warm and dry.  Neurological:     General: No focal deficit present.     Mental Status: She is alert and oriented to person, place, and time.  Psychiatric:        Mood and Affect: Mood normal.        Behavior: Behavior normal.        Thought Content: Thought content normal.        Judgment: Judgment normal.         Assessment And Plan:     1. Chronic bilateral low back pain without sciatica  I have encouraged her to increase her activity slowly and can continue taking tylenol for pain  She can also continue the gabapentin daily  I did discuss with her I would like to avoid steroids if possible due to not wanting to decrease her immune system with the current Pandemic - TSH  2. Class 1 obesity due to excess calories without serious comorbidity with body mass index (BMI) of 32.0 to 32.9 in adult  She is concerned about her weight she feels she has gained weight in the last few months  Will check metabolic panel to ensure no abnormalities - TSH    [name], FNP    THE PATIENT IS ENCOURAGED TO PRACTICE SOCIAL DISTANCING DUE TO THE COVID-19 PANDEMIC.

## 2018-06-01 NOTE — Patient Instructions (Signed)
Obesity, Adult Obesity is having too much body fat. If you have a BMI of 30 or more, you are obese. BMI is a number that explains how much body fat you have. Obesity is often caused by taking in (consuming) more calories than your body uses. Obesity can cause serious health problems. Changing your lifestyle can help to treat obesity. Follow these instructions at home: Eating and drinking   Follow advice from your doctor about what to eat and drink. Your doctor may tell you to: ? Cut down on (limit) fast foods, sweets, and processed snack foods. ? Choose low-fat options. For example, choose low-fat milk instead of whole milk. ? Eat 5 or more servings of fruits or vegetables every day. ? Eat at home more often. This gives you more control over what you eat. ? Choose healthy foods when you eat out. ? Learn what a healthy portion size is. A portion size is the amount of a certain food that is healthy for you to eat at one time. This is different for each person. ? Keep low-fat snacks available. ? Avoid sugary drinks. These include soda, fruit juice, iced tea that is sweetened with sugar, and flavored milk. ? Eat a healthy breakfast.  Drink enough water to keep your pee (urine) clear or pale yellow.  Do not go without eating for long periods of time (do not fast).  Do not go on popular or trendy diets (fad diets). Physical Activity  Exercise often, as told by your doctor. Ask your doctor: ? What types of exercise are safe for you. ? How often you should exercise.  Warm up and stretch before being active.  Do slow stretching after being active (cool down).  Rest between times of being active. Lifestyle  Limit how much time you spend in front of your TV, computer, or video game system (be less sedentary).  Find ways to reward yourself that do not involve food.  Limit alcohol intake to no more than 1 drink a day for nonpregnant women and 2 drinks a day for men. One drink equals 12 oz  of beer, 5 oz of wine, or 1 oz of hard liquor. General instructions  Keep a weight loss journal. This can help you keep track of: ? The food that you eat. ? The exercise that you do.  Take over-the-counter and prescription medicines only as told by your doctor.  Take vitamins and supplements only as told by your doctor.  Think about joining a support group. Your doctor may be able to help with this.  Keep all follow-up visits as told by your doctor. This is important. Contact a doctor if:  You cannot meet your weight loss goal after you have changed your diet and lifestyle for 6 weeks. This information is not intended to replace advice given to you by your health care provider. Make sure you discuss any questions you have with your health care provider. Document Released: 04/08/2011 Document Revised: 06/22/2015 Document Reviewed: 11/02/2014 Elsevier Interactive Patient Education  2019 [employer].

## 2018-06-15 ENCOUNTER — Telehealth: Payer: Self-pay

## 2018-06-15 ENCOUNTER — Other Ambulatory Visit: Payer: Self-pay | Admitting: Nurse Practitioner

## 2018-06-15 NOTE — Telephone Encounter (Signed)
Patient called stating she has an appt on 05/27 and she would like a urine test to eliminate the possibility of bladder cancer. She would like to have it done before her appointment. Also she would like to have her Mychart set up her email is [email or web address] [phone]   RETURNED PT CALL LEFT V/M TO CALL OFFICE I WANTED TO SEE NOTIFY HER THAT A URINE TEST WILL NOT DETERMINE IF SHE HAS BLADDER CANCER OR NOT AND I WANTED TO KNOW WHY SHE NEEDS IT. Lonia Mad

## 2018-06-16 ENCOUNTER — Other Ambulatory Visit: Payer: Self-pay | Admitting: Nurse Practitioner

## 2018-06-23 ENCOUNTER — Encounter: Payer: Self-pay | Admitting: Nurse Practitioner

## 2018-06-23 ENCOUNTER — Ambulatory Visit (INDEPENDENT_AMBULATORY_CARE_PROVIDER_SITE_OTHER): Payer: Medicare Other | Admitting: Nurse Practitioner

## 2018-06-23 ENCOUNTER — Other Ambulatory Visit: Payer: Self-pay

## 2018-06-23 ENCOUNTER — Telehealth: Payer: Self-pay | Admitting: Nurse Practitioner

## 2018-06-23 VITALS — BP 122/80 | HR 97 | Temp 97.9°F | Ht 63.8 in | Wt 179.0 lb

## 2018-06-23 DIAGNOSIS — R7309 Other abnormal glucose: Secondary | ICD-10-CM

## 2018-06-23 DIAGNOSIS — I10 Essential (primary) hypertension: Secondary | ICD-10-CM

## 2018-06-23 DIAGNOSIS — R350 Frequency of micturition: Secondary | ICD-10-CM

## 2018-06-23 DIAGNOSIS — M62838 Other muscle spasm: Secondary | ICD-10-CM

## 2018-06-23 DIAGNOSIS — R635 Abnormal weight gain: Secondary | ICD-10-CM | POA: Diagnosis not present

## 2018-06-23 DIAGNOSIS — E782 Mixed hyperlipidemia: Secondary | ICD-10-CM

## 2018-06-23 DIAGNOSIS — Z0000 Encounter for general adult medical examination without abnormal findings: Secondary | ICD-10-CM

## 2018-06-23 DIAGNOSIS — E785 Hyperlipidemia, unspecified: Secondary | ICD-10-CM

## 2018-06-23 DIAGNOSIS — Z23 Encounter for immunization: Secondary | ICD-10-CM | POA: Diagnosis not present

## 2018-06-23 DIAGNOSIS — G464 Cerebellar stroke syndrome: Secondary | ICD-10-CM

## 2018-06-23 LAB — POCT URINALYSIS DIPSTICK
Bilirubin, UA: NEGATIVE
Blood, UA: NEGATIVE
Glucose, UA: NEGATIVE
Ketones, UA: NEGATIVE
Nitrite, UA: NEGATIVE
Protein, UA: NEGATIVE
Spec Grav, UA: 1.02 (ref 1.010–1.025)
Urobilinogen, UA: 0.2 E.U./dL
pH, UA: 5.5 (ref 5.0–8.0)

## 2018-06-23 LAB — POCT UA - MICROALBUMIN
Albumin/Creatinine Ratio, Urine, POC: <30
Creatinine, POC: 100 mg/dL
Microalbumin Ur, POC: 10 mg/L

## 2018-06-23 MED ORDER — TETANUS-DIPHTHERIA TOXOIDS TD 2-2 LF/0.5ML IM SUSP: 0.5000 mL | Freq: Once | INTRAMUSCULAR | 0 refills | Status: AC

## 2018-06-23 MED ORDER — CYCLOBENZAPRINE HCL 5 MG PO TABS: 5.0000 mg | ORAL_TABLET | Freq: Three times a day (TID) | ORAL | 0 refills | Status: DC | PRN

## 2018-06-23 MED ORDER — OLMESARTAN MEDOXOMIL 40 MG PO TABS: 20.0000 mg | ORAL_TABLET | Freq: Every day | ORAL | 1 refills | Status: DC

## 2018-06-23 NOTE — Patient Instructions (Addendum)
I am increasing your olmesartan to 40 mg once a day take 2 two of your current dose '20mg'$  once a day until you get the new prescription.  In 2 weeks return for nurse visit to recheck blood pressure.    Health Maintenance, Female Adopting a healthy lifestyle and getting preventive care can go a long way to promote health and wellness. Talk with your health care provider about what schedule of regular examinations is right for you. This is a good chance for you to check in with your provider about disease prevention and staying healthy. In between checkups, there are plenty of things you can do on your own. Experts have done a lot of research about which lifestyle changes and preventive measures are most likely to keep you healthy. Ask your health care provider for more information. Weight and diet Eat a healthy diet  Be sure to include plenty of vegetables, fruits, low-fat dairy products, and lean protein.  Do not eat a lot of foods high in solid fats, added sugars, or salt.  Get regular exercise. This is one of the most important things you can do for your health. ? Most adults should exercise for at least 150 minutes each week. The exercise should increase your heart rate and make you sweat (moderate-intensity exercise). ? Most adults should also do strengthening exercises at least twice a week. This is in addition to the moderate-intensity exercise. Maintain a healthy weight  Body mass index (BMI) is a measurement that can be used to identify possible weight problems. It estimates body fat based on height and weight. Your health care provider can help determine your BMI and help you achieve or maintain a healthy weight.  For females 64 years of age and older: ? A BMI below 18.5 is considered underweight. ? A BMI of 18.5 to 24.9 is normal. ? A BMI of 25 to 29.9 is considered overweight. ? A BMI of 30 and above is considered obese. Watch levels of cholesterol and blood lipids  You should  start having your blood tested for lipids and cholesterol at 78 years of age, then have this test every 5 years.  You may need to have your cholesterol levels checked more often if: ? Your lipid or cholesterol levels are high. ? You are older than 78 years of age. ? You are at high risk for heart disease. Cancer screening Lung Cancer  Lung cancer screening is recommended for adults 69-27 years old who are at high risk for lung cancer because of a history of smoking.  A yearly low-dose CT scan of the lungs is recommended for people who: ? Currently smoke. ? Have quit within the past 15 years. ? Have at least a 30-pack-year history of smoking. A pack year is smoking an average of one pack of cigarettes a day for 1 year.  Yearly screening should continue until it has been 15 years since you quit.  Yearly screening should stop if you develop a health problem that would prevent you from having lung cancer treatment. Breast Cancer  Practice breast self-awareness. This means understanding how your breasts normally appear and feel.  It also means doing regular breast self-exams. Let your health care provider know about any changes, no matter how small.  If you are in your 20s or 30s, you should have a clinical breast exam (CBE) by a health care provider every 1-3 years as part of a regular health exam.  If you are 40 or older,  have a CBE every year. Also consider having a breast X-ray (mammogram) every year.  If you have a family history of breast cancer, talk to your health care provider about genetic screening.  If you are at high risk for breast cancer, talk to your health care provider about having an MRI and a mammogram every year.  Breast cancer gene (BRCA) assessment is recommended for women who have family members with BRCA-related cancers. BRCA-related cancers include: ? Breast. ? Ovarian. ? Tubal. ? Peritoneal cancers.  Results of the assessment will determine the need for  genetic counseling and BRCA1 and BRCA2 testing. Cervical Cancer Your health care provider may recommend that you be screened regularly for cancer of the pelvic organs (ovaries, uterus, and vagina). This screening involves a pelvic examination, including checking for microscopic changes to the surface of your cervix (Pap test). You may be encouraged to have this screening done every 3 years, beginning at age 48.  For women ages 67-65, health care providers may recommend pelvic exams and Pap testing every 3 years, or they may recommend the Pap and pelvic exam, combined with testing for human papilloma virus (HPV), every 5 years. Some types of HPV increase your risk of cervical cancer. Testing for HPV may also be done on women of any age with unclear Pap test results.  Other health care providers may not recommend any screening for nonpregnant women who are considered low risk for pelvic cancer and who do not have symptoms. Ask your health care provider if a screening pelvic exam is right for you.  If you have had past treatment for cervical cancer or a condition that could lead to cancer, you need Pap tests and screening for cancer for at least 20 years after your treatment. If Pap tests have been discontinued, your risk factors (such as having a new sexual partner) need to be reassessed to determine if screening should resume. Some women have medical problems that increase the chance of getting cervical cancer. In these cases, your health care provider may recommend more frequent screening and Pap tests. Colorectal Cancer  This type of cancer can be detected and often prevented.  Routine colorectal cancer screening usually begins at 78 years of age and continues through 78 years of age.  Your health care provider may recommend screening at an earlier age if you have risk factors for colon cancer.  Your health care provider may also recommend using home test kits to check for hidden blood in the  stool.  A small camera at the end of a tube can be used to examine your colon directly (sigmoidoscopy or colonoscopy). This is done to check for the earliest forms of colorectal cancer.  Routine screening usually begins at age 28.  Direct examination of the colon should be repeated every 5-10 years through 78 years of age. However, you may need to be screened more often if early forms of precancerous polyps or small growths are found. Skin Cancer  Check your skin from head to toe regularly.  Tell your health care provider about any new moles or changes in moles, especially if there is a change in a mole's shape or color.  Also tell your health care provider if you have a mole that is larger than the size of a pencil eraser.  Always use sunscreen. Apply sunscreen liberally and repeatedly throughout the day.  Protect yourself by wearing long sleeves, pants, a wide-brimmed hat, and sunglasses whenever you are outside. Heart disease, diabetes, and  high blood pressure  High blood pressure causes heart disease and increases the risk of stroke. High blood pressure is more likely to develop in: ? People who have blood pressure in the high end of the normal range (130-139/85-89 mm Hg). ? People who are overweight or obese. ? People who are African American.  If you are 16-65 years of age, have your blood pressure checked every 3-5 years. If you are 78 years of age or older, have your blood pressure checked every year. You should have your blood pressure measured twice-once when you are at a hospital or clinic, and once when you are not at a hospital or clinic. Record the average of the two measurements. To check your blood pressure when you are not at a hospital or clinic, you can use: ? An automated blood pressure machine at a pharmacy. ? A home blood pressure monitor.  If you are between 26 years and 24 years old, ask your health care provider if you should take aspirin to prevent  strokes.  Have regular diabetes screenings. This involves taking a blood sample to check your fasting blood sugar level. ? If you are at a normal weight and have a low risk for diabetes, have this test once every three years after 78 years of age. ? If you are overweight and have a high risk for diabetes, consider being tested at a younger age or more often. Preventing infection Hepatitis B  If you have a higher risk for hepatitis B, you should be screened for this virus. You are considered at high risk for hepatitis B if: ? You were born in a country where hepatitis B is common. Ask your health care provider which countries are considered high risk. ? Your parents were born in a high-risk country, and you have not been immunized against hepatitis B (hepatitis B vaccine). ? You have HIV or AIDS. ? You use needles to inject street drugs. ? You live with someone who has hepatitis B. ? You have had sex with someone who has hepatitis B. ? You get hemodialysis treatment. ? You take certain medicines for conditions, including cancer, organ transplantation, and autoimmune conditions. Hepatitis C  Blood testing is recommended for: ? Everyone born from [birth year] through 1965. ? Anyone with known risk factors for hepatitis C. Sexually transmitted infections (STIs)  You should be screened for sexually transmitted infections (STIs) including gonorrhea and chlamydia if: ? You are sexually active and are younger than 78 years of age. ? You are older than 78 years of age and your health care provider tells you that you are at risk for this type of infection. ? Your sexual activity has changed since you were last screened and you are at an increased risk for chlamydia or gonorrhea. Ask your health care provider if you are at risk.  If you do not have HIV, but are at risk, it may be recommended that you take a prescription medicine daily to prevent HIV infection. This is called pre-exposure prophylaxis  (PrEP). You are considered at risk if: ? You are sexually active and do not regularly use condoms or know the HIV status of your partner(s). ? You take drugs by injection. ? You are sexually active with a partner who has HIV. Talk with your health care provider about whether you are at high risk of being infected with HIV. If you choose to begin PrEP, you should first be tested for HIV. You should then be tested every 3  months for as long as you are taking PrEP. Pregnancy  If you are premenopausal and you may become pregnant, ask your health care provider about preconception counseling.  If you may become pregnant, take 400 to 800 micrograms (mcg) of folic acid every day.  If you want to prevent pregnancy, talk to your health care provider about birth control (contraception). Osteoporosis and menopause  Osteoporosis is a disease in which the bones lose minerals and strength with aging. This can result in serious bone fractures. Your risk for osteoporosis can be identified using a bone density scan.  If you are 20 years of age or older, or if you are at risk for osteoporosis and fractures, ask your health care provider if you should be screened.  Ask your health care provider whether you should take a calcium or vitamin D supplement to lower your risk for osteoporosis.  Menopause may have certain physical symptoms and risks.  Hormone replacement therapy may reduce some of these symptoms and risks. Talk to your health care provider about whether hormone replacement therapy is right for you. Follow these instructions at home:  Schedule regular health, dental, and eye exams.  Stay current with your immunizations.  Do not use any tobacco products including cigarettes, chewing tobacco, or electronic cigarettes.  If you are pregnant, do not drink alcohol.  If you are breastfeeding, limit how much and how often you drink alcohol.  Limit alcohol intake to no more than 1 drink per day for  nonpregnant women. One drink equals 12 ounces of beer, 5 ounces of wine, or 1 ounces of hard liquor.  Do not use street drugs.  Do not share needles.  Ask your health care provider for help if you need support or information about quitting drugs.  Tell your health care provider if you often feel depressed.  Tell your health care provider if you have ever been abused or do not feel safe at home. This information is not intended to replace advice given to you by your health care provider. Make sure you discuss any questions you have with your health care provider. Document Released: 07/30/2010 Document Revised: 06/22/2015 Document Reviewed: 10/18/2014 Elsevier Interactive Patient Education  2019 [employer].   Back Exercises If you have pain in your back, do these exercises 2-3 times each day or as told by your doctor. When the pain goes away, do the exercises once each day, but repeat the steps more times for each exercise (do more repetitions). If you do not have pain in your back, do these exercises once each day or as told by your doctor. Exercises Single Knee to Chest Do these steps 3-5 times in a row for each leg: 2. Lie on your back on a firm bed or the floor with your legs stretched out. 3. Bring one knee to your chest. 4. Hold your knee to your chest by grabbing your knee or thigh. 5. Pull on your knee until you feel a gentle stretch in your lower back. 6. Keep doing the stretch for 10-30 seconds. 7. Slowly let go of your leg and straighten it. Pelvic Tilt Do these steps 5-10 times in a row: 1. Lie on your back on a firm bed or the floor with your legs stretched out. 2. Bend your knees so they point up to the ceiling. Your feet should be flat on the floor. 3. Tighten your lower belly (abdomen) muscles to press your lower back against the floor. This will make your tailbone  point up to the ceiling instead of pointing down to your feet or the floor. 4. Stay in this position  for 5-10 seconds while you gently tighten your muscles and breathe evenly. Cat-Cow Do these steps until your lower back bends more easily: 1. Get on your hands and knees on a firm surface. Keep your hands under your shoulders, and keep your knees under your hips. You may put padding under your knees. 2. Let your head hang down, and make your tailbone point down to the floor so your lower back is round like the back of a cat. 3. Stay in this position for 5 seconds. 4. Slowly lift your head and make your tailbone point up to the ceiling so your back hangs low (sags) like the back of a cow. 5. Stay in this position for 5 seconds.  Press-Ups Do these steps 5-10 times in a row: 1. Lie on your belly (face-down) on the floor. 2. Place your hands near your head, about shoulder-width apart. 3. While you keep your back relaxed and keep your hips on the floor, slowly straighten your arms to raise the top half of your body and lift your shoulders. Do not use your back muscles. To make yourself more comfortable, you may change where you place your hands. 4. Stay in this position for 5 seconds. 5. Slowly return to lying flat on the floor.  Bridges Do these steps 10 times in a row: 1. Lie on your back on a firm surface. 2. Bend your knees so they point up to the ceiling. Your feet should be flat on the floor. 3. Tighten your butt muscles and lift your butt off of the floor until your waist is almost as high as your knees. If you do not feel the muscles working in your butt and the back of your thighs, slide your feet 1-2 inches farther away from your butt. 4. Stay in this position for 3-5 seconds. 5. Slowly lower your butt to the floor, and let your butt muscles relax. If this exercise is too easy, try doing it with your arms crossed over your chest. Belly Crunches Do these steps 5-10 times in a row: 1. Lie on your back on a firm bed or the floor with your legs stretched out. 2. Bend your knees so they  point up to the ceiling. Your feet should be flat on the floor. 3. Cross your arms over your chest. 4. Tip your chin a little bit toward your chest but do not bend your neck. 5. Tighten your belly muscles and slowly raise your chest just enough to lift your shoulder blades a tiny bit off of the floor. 6. Slowly lower your chest and your head to the floor. Back Lifts Do these steps 5-10 times in a row: 1. Lie on your belly (face-down) with your arms at your sides, and rest your forehead on the floor. 2. Tighten the muscles in your legs and your butt. 3. Slowly lift your chest off of the floor while you keep your hips on the floor. Keep the back of your head in line with the curve in your back. Look at the floor while you do this. 4. Stay in this position for 3-5 seconds. 5. Slowly lower your chest and your face to the floor. Contact a doctor if:  Your back pain gets a lot worse when you do an exercise.  Your back pain does not lessen 2 hours after you exercise. If you have any  of these problems, stop doing the exercises. Do not do them again unless your doctor says it is okay. Get help right away if:  You have sudden, very bad back pain. If this happens, stop doing the exercises. Do not do them again unless your doctor says it is okay. This information is not intended to replace advice given to you by your health care provider. Make sure you discuss any questions you have with your health care provider. Document Released: 02/16/2010 Document Revised: 10/08/2017 Document Reviewed: 03/10/2014 Elsevier Interactive Patient Education  [employer].

## 2018-06-23 NOTE — Telephone Encounter (Signed)
I spoke with both the patient and her son to reschedule her CPE, and they requested to be seen today.  I rescheduled the CPE for today and explained that Pamala Hurry will call her at 2:00 tomorrow 06/24/2018 for AWV. VDM (DD)

## 2018-06-23 NOTE — Progress Notes (Addendum)
Subjective:     Patient ID: Donna Gilmore , female    DOB: 18-May-1940 , 78 y.o.   MRN: [id number]   Chief Complaint  Patient presents with  . Annual Exam   The patient states she uses post menopausal status for birth control. Last LMP was No LMP recorded. Patient is postmenopausal.. Negative for Dysmenorrhea and Negative for Menorrhagia Mammogram last done 07/07/2017 has an appt for her mammogram in June 2020.  Negative for: breast discharge, breast lump(s), breast pain and breast self exam.  Pertinent negatives include abnormal bleeding (hematology), anxiety, decreased libido, depression, difficulty falling sleep, dyspareunia, history of infertility, nocturia, sexual dysfunction, sleep disturbances, urinary incontinence, urinary urgency, vaginal discharge and vaginal itching. Diet regular. The patient states her exercise level is  able to do some chair exercises.      The patient's tobacco use is:  Social History   Tobacco Use  Smoking Status Former Smoker  . Packs/day: 0.30  . Years: 30.00  . Pack years: 9.00  . Types: Cigarettes  . Last attempt to quit: 2019  . Years since quitting: 1.4  Smokeless Tobacco Never Used   She has been exposed to passive smoke. The patient's alcohol use is:  Social History   Substance and Sexual Activity  Alcohol Use Yes   Comment: occasional   Additional information: Last pap N/A.    HPI  Hypertension  This is a chronic problem. The current episode started more than 1 year ago. The problem is controlled. There are no associated agents to hypertension. Risk factors for coronary artery disease include diabetes mellitus, sedentary lifestyle and obesity. Past treatments include angiotensin blockers. There are no compliance problems.  There is no history of angina.  Diabetes  She presents for her follow-up diabetic visit. Diabetes type: prediabetes. There are no diabetic associated symptoms.     Past Medical History:  Diagnosis Date  . Benign  paroxysmal positional vertigo 08/26/2012  . CVA (cerebral infarction) 08/25/2012  . Dyslipidemia 08/25/2012  . Essential hypertension, benign 08/25/2012  . Hyperlipidemia   . Hypertension   . Meralgia paraesthetica, left 01/12/2018  . Stroke [facility])      Family History  Problem Relation Age of Onset  . Kidney failure Mother   . Diabetes Mother   . Alzheimer's disease Mother   . Alzheimer's disease Father   . Prostate cancer Father   . Diabetes Maternal Grandmother   . Diabetes Maternal Grandfather   . Alzheimer's disease Paternal Grandmother   . Alzheimer's disease Paternal Grandfather   . Prostate cancer Paternal Uncle      Current Outpatient Medications:  .  albuterol (PROAIR HFA) 108 (90 Base) MCG/ACT inhaler, Inhale 2 puffs into the lungs every 6 (six) hours as needed for wheezing or shortness of breath., Disp: 2 Inhaler, Rfl: 1 .  atorvastatin (LIPITOR) 80 MG tablet, TAKE 1 TABLET BY MOUTH  DAILY, Disp: 90 tablet, Rfl: 0 .  clopidogrel (PLAVIX) 75 MG tablet, TAKE 1 TABLET BY MOUTH  DAILY, Disp: 90 tablet, Rfl: 0 .  donepezil (ARICEPT) 5 MG tablet, Take 1 tablet (5 mg total) by mouth at bedtime., Disp: 30 tablet, Rfl: 2 .  fluticasone (FLONASE) 50 MCG/ACT nasal spray, Place 1 spray into both nostrils daily., Disp: 16 g, Rfl: 0 .  levocetirizine (XYZAL) 5 MG tablet, TAKE 1 TABLET BY MOUTH  EVERY DAY IN THE EVENING, Disp: 90 tablet, Rfl: 1 .  meclizine (ANTIVERT) 25 MG tablet, TAKE 1 TABLET BY MOUTH 2  TIMES EVERY DAY AS NEEDED, Disp: 90 tablet, Rfl: 0 .  olmesartan (BENICAR) 20 MG tablet, Take 1 tablet (20 mg total) by mouth daily., Disp: 90 tablet, Rfl: 1 .  OVER THE COUNTER MEDICATION, , Disp: , Rfl:   Current Facility-Administered Medications:  .  0.9 %  sodium chloride infusion, 500 mL, Intravenous, Once, [name], MD   No Known Allergies   Review of Systems  Constitutional: Negative.   HENT: Negative.   Eyes: Negative.   Respiratory: Negative.   Cardiovascular:  Negative.   Gastrointestinal: Negative.   Endocrine: Negative.   Genitourinary: Positive for frequency.  Musculoskeletal: Positive for back pain.  Skin: Negative.   Allergic/Immunologic: Negative.   Neurological: Negative.   Hematological: Negative.   Psychiatric/Behavioral: Negative.      Today's Vitals   06/23/18 1143  BP: 122/80  Pulse: 97  Temp: 97.9 F (36.6 C)  TempSrc: Oral  Weight: 179 lb (81.2 kg)  Height: 5' 3.8" (1.621 m)  PainSc: 4   PainLoc: Back   Body mass index is 30.92 kg/m.   Objective:  Physical Exam Constitutional:      Appearance: Normal appearance. She is well-developed. She is obese.  HENT:     Head: Normocephalic and atraumatic.     Right Ear: Hearing, tympanic membrane, ear canal and external ear normal.     Left Ear: Hearing, tympanic membrane, ear canal and external ear normal.     Nose: Nose normal.     Mouth/Throat:     Mouth: Mucous membranes are moist.  Eyes:     General: Lids are normal.     Conjunctiva/sclera: Conjunctivae normal.     Pupils: Pupils are equal, round, and reactive to light.     Funduscopic exam:    Right eye: No papilledema.        Left eye: No papilledema.  Neck:     Musculoskeletal: Full passive range of motion without pain, normal range of motion and neck supple.     Thyroid: No thyroid mass.     Vascular: No carotid bruit.  Cardiovascular:     Rate and Rhythm: Normal rate and regular rhythm.     Pulses: Normal pulses.     Heart sounds: Normal heart sounds. No murmur.  Pulmonary:     Effort: Pulmonary effort is normal.     Breath sounds: Normal breath sounds.  Abdominal:     General: Abdomen is flat. Bowel sounds are normal. There is no distension.     Palpations: Abdomen is soft.     Tenderness: There is no abdominal tenderness.     Comments: Abdomen has central obesity does not look distended  Musculoskeletal: Normal range of motion.        General: No swelling, tenderness or deformity.     Right  lower leg: No edema.     Left lower leg: No edema.  Skin:    General: Skin is warm and dry.     Capillary Refill: Capillary refill takes less than 2 seconds.  Neurological:     General: No focal deficit present.     Mental Status: She is alert and oriented to person, place, and time.     Cranial Nerves: No cranial nerve deficit.     Sensory: No sensory deficit.  Psychiatric:        Mood and Affect: Mood normal.        Behavior: Behavior normal.        Thought Content: Thought  content normal.        Judgment: Judgment normal.         Assessment And Plan:     1. Essential hypertension . B/P is well controlled.  . CMP ordered to check renal function.  . The importance of regular exercise and dietary modification was stressed to the patient.  . Stressed importance of losing ten percent of her body weight to help with B/P control.  - POCT Urinalysis Dipstick (81002) - POCT UA - Microalbumin - EKG 12-Lead - olmesartan (BENICAR) 40 MG tablet; Take 0.5 tablets (20 mg total) by mouth daily.  Dispense: 90 tablet; Refill: 1  2. Encounter for immunization Will give tetanus vaccine today while in office. Refer to order management. TDAP will be administered to adults 10-40 years old every 10 years. Sent to pharmacy - diptheria-tetanus toxoids [facility]) 2-2 LF/0.5ML injection; Inject 0.5 mLs into the muscle once for 1 dose.  Dispense: 0.5 mL; Refill: 0  3. Dyslipidemia  Chronic, controlled  Continue with current medications  4. Mixed hyperlipidemia  Chronic, controlled  Continue with current medications - Lipid Profile  5. Abnormal glucose  Chronic, fair control  Continue with current medications  Encouraged to limit intake of sugary foods and drinks  Encouraged to increase physical activity to 150 minutes per week as tolerated - Hemoglobin A1c - Lipid Profile  6. Cerebellar stroke syndrome  Stable  She is not seeing the neurologist regularly - CMP14 + Anion  Gap  7. Muscle spasm  Tension to low back will see how the cyclobenzaprine works  May also benefit from [facility] at [facility] - cyclobenzaprine (FLEXERIL) 5 MG tablet; Take 1 tablet (5 mg total) by mouth 3 (three) times daily as needed for muscle spasms.  Dispense: 30 tablet; Refill: 0  8. Weight gain  She would benefit from a nutrition consult   - Amb ref to Medical Nutrition Therapy-MNT  9. Urinary frequency  urinalyisis trace leukocytes  Will send urine for culture - Culture, Urine  10. Health maintenance examination . Behavior modifications discussed and diet history reviewed.   . Pt will continue to exercise regularly and modify diet with low GI, plant based foods and decrease intake of processed foods.  . Recommend intake of daily multivitamin, Vitamin D, and calcium.  . Recommend for preventive screenings, as well as recommend immunizations that include influenza, TDAP   Spent more than 50% of visit counseling patient and providing guidance total time of visit was 60 minutes  [name], FNP    THE PATIENT IS ENCOURAGED TO PRACTICE SOCIAL DISTANCING DUE TO THE COVID-19 PANDEMIC.

## 2018-06-24 ENCOUNTER — Ambulatory Visit (INDEPENDENT_AMBULATORY_CARE_PROVIDER_SITE_OTHER): Payer: Medicare Other

## 2018-06-24 ENCOUNTER — Encounter: Payer: Medicare Other | Admitting: Nurse Practitioner

## 2018-06-24 VITALS — Ht 66.0 in | Wt 179.0 lb

## 2018-06-24 DIAGNOSIS — Z0000 Encounter for general adult medical examination without abnormal findings: Secondary | ICD-10-CM | POA: Diagnosis not present

## 2018-06-24 DIAGNOSIS — Z23 Encounter for immunization: Secondary | ICD-10-CM | POA: Diagnosis not present

## 2018-06-24 LAB — HEMOGLOBIN A1C
Est. average glucose Bld gHb Est-mCnc: 148 mg/dL
Hgb A1c MFr Bld: 6.8 % — ABNORMAL HIGH (ref 4.8–5.6)

## 2018-06-24 LAB — CMP14 + ANION GAP
ALT: 22 IU/L (ref 0–32)
AST: 24 IU/L (ref 0–40)
Albumin/Globulin Ratio: 1.6 (ref 1.2–2.2)
Albumin: 4.3 g/dL (ref 3.7–4.7)
Alkaline Phosphatase: 111 IU/L (ref 39–117)
Anion Gap: 19 mmol/L — ABNORMAL HIGH (ref 10.0–18.0)
BUN/Creatinine Ratio: 7 — ABNORMAL LOW (ref 12–28)
BUN: 8 mg/dL (ref 8–27)
Bilirubin Total: 0.5 mg/dL (ref 0.0–1.2)
CO2: 20 mmol/L (ref 20–29)
Calcium: 9.8 mg/dL (ref 8.7–10.3)
Chloride: 102 mmol/L (ref 96–106)
Creatinine, Ser: 1.13 mg/dL — ABNORMAL HIGH (ref 0.57–1.00)
GFR calc Af Amer: 54 mL/min/{1.73_m2} — ABNORMAL LOW (ref >59)
GFR calc non Af Amer: 47 mL/min/{1.73_m2} — ABNORMAL LOW (ref >59)
Globulin, Total: 2.7 g/dL (ref 1.5–4.5)
Glucose: 96 mg/dL (ref 65–99)
Potassium: 4.5 mmol/L (ref 3.5–5.2)
Sodium: 141 mmol/L (ref 134–144)
Total Protein: 7 g/dL (ref 6.0–8.5)

## 2018-06-24 LAB — LIPID PANEL
Chol/HDL Ratio: 3.8 ratio (ref 0.0–4.4)
Cholesterol, Total: 179 mg/dL (ref 100–199)
HDL: 47 mg/dL (ref >39)
LDL Calculated: 84 mg/dL (ref 0–99)
Triglycerides: 241 mg/dL — ABNORMAL HIGH (ref 0–149)
VLDL Cholesterol Cal: 48 mg/dL — ABNORMAL HIGH (ref 5–40)

## 2018-06-24 MED ORDER — PNEUMOCOCCAL 13-VAL CONJ VACC IM SUSP: 0.5000 mL | INTRAMUSCULAR | 0 refills | Status: AC

## 2018-06-24 NOTE — Patient Instructions (Signed)
Donna Gilmore , Thank you for taking time to come for your Medicare Wellness Visit. I appreciate your ongoing commitment to your health goals. Please review the following plan we discussed and let me know if I can assist you in the future.   Screening recommendations/referrals: Colonoscopy: not required Mammogram: 06/2017 Bone Density: 01/2015 Recommended yearly ophthalmology/optometry visit for glaucoma screening and checkup Recommended yearly dental visit for hygiene and checkup  Vaccinations: Influenza vaccine: 10/2017 Pneumococcal vaccine: referred Tdap vaccine: due Shingles vaccine: discussed    Advanced directives: Please bring a copy of your POA (Power of Attorney) and/or Living Will to your next appointment.    Conditions/risks identified: Overweight  Next appointment: 10/26/2018 at 9:30   Preventive Care 27 Years and Older, Female Preventive care refers to lifestyle choices and visits with your health care provider that can promote health and wellness. What does preventive care include?  A yearly physical exam. This is also called an annual well check.  Dental exams once or twice a year.  Routine eye exams. Ask your health care provider how often you should have your eyes checked.  Personal lifestyle choices, including:  Daily care of your teeth and gums.  Regular physical activity.  Eating a healthy diet.  Avoiding tobacco and drug use.  Limiting alcohol use.  Practicing safe sex.  Taking low-dose aspirin every day.  Taking vitamin and mineral supplements as recommended by your health care provider. What happens during an annual well check? The services and screenings done by your health care provider during your annual well check will depend on your age, overall health, lifestyle risk factors, and family history of disease. Counseling  Your health care provider may ask you questions about your:  Alcohol use.  Tobacco use.  Drug use.  Emotional  well-being.  Home and relationship well-being.  Sexual activity.  Eating habits.  History of falls.  Memory and ability to understand (cognition).  Work and work Statistician.  Reproductive health. Screening  You may have the following tests or measurements:  Height, weight, and BMI.  Blood pressure.  Lipid and cholesterol levels. These may be checked every 5 years, or more frequently if you are over 67 years old.  Skin check.  Lung cancer screening. You may have this screening every year starting at age 22 if you have a 30-pack-year history of smoking and currently smoke or have quit within the past 15 years.  Fecal occult blood test (FOBT) of the stool. You may have this test every year starting at age 55.  Flexible sigmoidoscopy or colonoscopy. You may have a sigmoidoscopy every 5 years or a colonoscopy every 10 years starting at age 41.  Hepatitis C blood test.  Hepatitis B blood test.  Sexually transmitted disease (STD) testing.  Diabetes screening. This is done by checking your blood sugar (glucose) after you have not eaten for a while (fasting). You may have this done every 1-3 years.  Bone density scan. This is done to screen for osteoporosis. You may have this done starting at age 55.  Mammogram. This may be done every 1-2 years. Talk to your health care provider about how often you should have regular mammograms. Talk with your health care provider about your test results, treatment options, and if necessary, the need for more tests. Vaccines  Your health care provider may recommend certain vaccines, such as:  Influenza vaccine. This is recommended every year.  Tetanus, diphtheria, and acellular pertussis (Tdap, Td) vaccine. You may need a Td  booster every 10 years.  Zoster vaccine. You may need this after age 53.  Pneumococcal 13-valent conjugate (PCV13) vaccine. One dose is recommended after age 27.  Pneumococcal polysaccharide (PPSV23) vaccine. One  dose is recommended after age 21. Talk to your health care provider about which screenings and vaccines you need and how often you need them. This information is not intended to replace advice given to you by your health care provider. Make sure you discuss any questions you have with your health care provider. Document Released: 02/10/2015 Document Revised: 10/04/2015 Document Reviewed: 11/15/2014 Elsevier Interactive Patient Education  2017 [place] Prevention in the Home Falls can cause injuries. They can happen to people of all ages. There are many things you can do to make your home safe and to help prevent falls. What can I do on the outside of my home?  Regularly fix the edges of walkways and driveways and fix any cracks.  Remove anything that might make you trip as you walk through a door, such as a raised step or threshold.  Trim any bushes or trees on the path to your home.  Use bright outdoor lighting.  Clear any walking paths of anything that might make someone trip, such as rocks or tools.  Regularly check to see if handrails are loose or broken. Make sure that both sides of any steps have handrails.  Any raised decks and porches should have guardrails on the edges.  Have any leaves, snow, or ice cleared regularly.  Use sand or salt on walking paths during winter.  Clean up any spills in your garage right away. This includes oil or grease spills. What can I do in the bathroom?  Use night lights.  Install grab bars by the toilet and in the tub and shower. Do not use towel bars as grab bars.  Use non-skid mats or decals in the tub or shower.  If you need to sit down in the shower, use a plastic, non-slip stool.  Keep the floor dry. Clean up any water that spills on the floor as soon as it happens.  Remove soap buildup in the tub or shower regularly.  Attach bath mats securely with double-sided non-slip rug tape.  Do not have throw rugs and other  things on the floor that can make you trip. What can I do in the bedroom?  Use night lights.  Make sure that you have a light by your bed that is easy to reach.  Do not use any sheets or blankets that are too big for your bed. They should not hang down onto the floor.  Have a firm chair that has side arms. You can use this for support while you get dressed.  Do not have throw rugs and other things on the floor that can make you trip. What can I do in the kitchen?  Clean up any spills right away.  Avoid walking on wet floors.  Keep items that you use a lot in easy-to-reach places.  If you need to reach something above you, use a strong step stool that has a grab bar.  Keep electrical cords out of the way.  Do not use floor polish or wax that makes floors slippery. If you must use wax, use non-skid floor wax.  Do not have throw rugs and other things on the floor that can make you trip. What can I do with my stairs?  Do not leave any items on the stairs.  Make sure that there are handrails on both sides of the stairs and use them. Fix handrails that are broken or loose. Make sure that handrails are as long as the stairways.  Check any carpeting to make sure that it is firmly attached to the stairs. Fix any carpet that is loose or worn.  Avoid having throw rugs at the top or bottom of the stairs. If you do have throw rugs, attach them to the floor with carpet tape.  Make sure that you have a light switch at the top of the stairs and the bottom of the stairs. If you do not have them, ask someone to add them for you. What else can I do to help prevent falls?  Wear shoes that:  Do not have high heels.  Have rubber bottoms.  Are comfortable and fit you well.  Are closed at the toe. Do not wear sandals.  If you use a stepladder:  Make sure that it is fully opened. Do not climb a closed stepladder.  Make sure that both sides of the stepladder are locked into place.  Ask  someone to hold it for you, if possible.  Clearly mark and make sure that you can see:  Any grab bars or handrails.  First and last steps.  Where the edge of each step is.  Use tools that help you move around (mobility aids) if they are needed. These include:  Canes.  Walkers.  Scooters.  Crutches.  Turn on the lights when you go into a dark area. Replace any light bulbs as soon as they burn out.  Set up your furniture so you have a clear path. Avoid moving your furniture around.  If any of your floors are uneven, fix them.  If there are any pets around you, be aware of where they are.  Review your medicines with your doctor. Some medicines can make you feel dizzy. This can increase your chance of falling. Ask your doctor what other things that you can do to help prevent falls. This information is not intended to replace advice given to you by your health care provider. Make sure you discuss any questions you have with your health care provider. Document Released: 11/10/2008 Document Revised: 06/22/2015 Document Reviewed: 02/18/2014 Elsevier Interactive Patient Education  2017 [employer].

## 2018-06-24 NOTE — Progress Notes (Signed)
Subjective:   Donna Gilmore is a 78 y.o. female who presents for Medicare Annual (Subsequent) preventive examination.  This visit type was conducted due to national recommendations for restrictions regarding the COVID-19 Pandemic (e.g. social distancing). This format is felt to be most appropriate for this patient at this time. All issues noted in this document were discussed and addressed. No physical exam was performed (except for noted visual exam findings with Video Visits). This patient,Donna Gilmore,  has given permission to perform this visit via telephone. Vital signs may be absent or patient reported.  Patient location:  At home  Nurse location:  [place] office     Review of Systems:  n/a Cardiac Risk Factors include: advanced age (>11men, >3 women);diabetes mellitus;sedentary lifestyle;hypertension;dyslipidemia     Objective:     Vitals: Ht 5\' 6"  (1.676 m) Comment: per patient  Wt 179 lb (81.2 kg) Comment: per patient  BMI 28.89 kg/m   Body mass index is 28.89 kg/m.  Advanced Directives 06/24/2018 10/22/2017 03/30/2014 11/02/2013 09/21/2012 08/25/2012  Does Patient Have a Medical Advance Directive? Yes No No Yes Patient does not have advance directive Patient has advance directive, copy not in chart  Type of Advance Directive Living will;Healthcare Power of [place];Living will - [place]  Does patient want to make changes to medical advance directive? - - - No - Patient declined - -  Copy of [place] in Chart? No - copy requested - - No - copy requested - Copy requested from family  Pre-existing out of facility DNR order (yellow form or pink MOST form) - - - - No -    Tobacco Social History   Tobacco Use  Smoking Status Former Smoker  . Packs/day: 0.30  . Years: 30.00  . Pack years: 9.00  . Types: Cigarettes  . Last attempt to quit: 2019  . Years since quitting: 1.4  Smokeless Tobacco  Never Used     Counseling given: Not Answered   Clinical Intake:  Pre-visit preparation completed: Yes  Pain : No/denies pain     Nutritional Status: BMI 25 -29 Overweight Nutritional Risks: None Diabetes: Yes CBG done?: No Did pt. bring in CBG monitor from home?: No  How often do you need to have someone help you when you read instructions, pamphlets, or other written materials from your doctor or pharmacy?: 1 - Never What is the last grade level you completed in school?: some college     Information entered by :: NAllen LPN  Past Medical History:  Diagnosis Date  . Benign paroxysmal positional vertigo 08/26/2012  . CVA (cerebral infarction) 08/25/2012  . Dyslipidemia 08/25/2012  . Essential hypertension, benign 08/25/2012  . Hyperlipidemia   . Hypertension   . Meralgia paraesthetica, left 01/12/2018  . Stroke [facility])    History reviewed. No pertinent surgical history. Family History  Problem Relation Age of Onset  . Kidney failure Mother   . Diabetes Mother   . Alzheimer's disease Mother   . Alzheimer's disease Father   . Prostate cancer Father   . Diabetes Maternal Grandmother   . Diabetes Maternal Grandfather   . Alzheimer's disease Paternal Grandmother   . Alzheimer's disease Paternal Grandfather   . Prostate cancer Paternal Uncle    Social History   Socioeconomic History  . Marital status: Divorced    Spouse name: Not on file  . Number of children: 4  . Years of education: Not  on file  . Highest education level: Not on file  Occupational History  . Occupation: retired  Scientific laboratory technician  . Financial resource strain: Not hard at all  . Food insecurity:    Worry: Never true    Inability: Never true  . Transportation needs:    Medical: No    Non-medical: No  Tobacco Use  . Smoking status: Former Smoker    Packs/day: 0.30    Years: 30.00    Pack years: 9.00    Types: Cigarettes    Last attempt to quit: 2019    Years since quitting: 1.4  . Smokeless  tobacco: Never Used  Substance and Sexual Activity  . Alcohol use: Yes    Comment: occasional  . Drug use: No  . Sexual activity: Not Currently  Lifestyle  . Physical activity:    Days per week: 0 days    Minutes per session: 0 min  . Stress: Not at all  Relationships  . Social connections:    Talks on phone: Not on file    Gets together: Not on file    Attends religious service: Not on file    Active member of club or organization: Not on file    Attends meetings of clubs or organizations: Not on file    Relationship status: Not on file  Other Topics Concern  . Not on file  Social History Narrative  . Not on file    Outpatient Encounter Medications as of 06/24/2018  Medication Sig  . albuterol (PROAIR HFA) 108 (90 Base) MCG/ACT inhaler Inhale 2 puffs into the lungs every 6 (six) hours as needed for wheezing or shortness of breath.  Marland Kitchen atorvastatin (LIPITOR) 80 MG tablet TAKE 1 TABLET BY MOUTH  DAILY  . clopidogrel (PLAVIX) 75 MG tablet TAKE 1 TABLET BY MOUTH  DAILY  . cyclobenzaprine (FLEXERIL) 5 MG tablet Take 1 tablet (5 mg total) by mouth 3 (three) times daily as needed for muscle spasms.  . fluticasone (FLONASE) 50 MCG/ACT nasal spray Place 1 spray into both nostrils daily.  . Ginkgo Biloba 40 MG TABS Take by mouth.  . levocetirizine (XYZAL) 5 MG tablet TAKE 1 TABLET BY MOUTH  EVERY DAY IN THE EVENING  . meclizine (ANTIVERT) 25 MG tablet TAKE 1 TABLET BY MOUTH 2  TIMES EVERY DAY AS NEEDED  . olmesartan (BENICAR) 40 MG tablet Take 0.5 tablets (20 mg total) by mouth daily.  Marland Kitchen OVER THE COUNTER MEDICATION   . vitamin C (ASCORBIC ACID) 500 MG tablet Take 500 mg by mouth daily.  Marland Kitchen donepezil (ARICEPT) 5 MG tablet Take 1 tablet (5 mg total) by mouth at bedtime. (Patient not taking: Reported on 06/24/2018)  . pneumococcal 13-valent conjugate vaccine (PREVNAR 13) SUSP injection Inject 0.5 mLs into the muscle tomorrow at 10 am for 1 dose.  . [DISCONTINUED] albuterol (PROAIR HFA) 108  (90 Base) MCG/ACT inhaler Inhale 2 puffs into the lungs every 6 (six) hours as needed for wheezing or shortness of breath.  . [DISCONTINUED] atorvastatin (LIPITOR) 40 MG tablet TAKE 1 TABLET BY MOUTH  EVERY DAY AT BEDTIME  . [DISCONTINUED] clopidogrel (PLAVIX) 75 MG tablet Take 1 tablet (75 mg total) by mouth daily.  . [DISCONTINUED] diazepam (VALIUM) 5 MG tablet Take 0.5 tablets (2.5 mg total) by mouth every 8 (eight) hours as needed for anxiety (VERTIGO). (Patient not taking: Reported on 06/23/2018)  . [DISCONTINUED] gabapentin (NEURONTIN) 100 MG capsule Take 1 capsule (100 mg total) by mouth 3 (three) times  daily as needed. (Patient not taking: Reported on 06/23/2018)  . [DISCONTINUED] meclizine (ANTIVERT) 25 MG tablet TAKE 1 TABLET BY MOUTH 2  TIMES EVERY DAY AS NEEDED  . [DISCONTINUED] olmesartan (BENICAR) 20 MG tablet Take 1 tablet (20 mg total) by mouth daily.  . [DISCONTINUED] predniSONE (STERAPRED UNI-PAK 21 TAB) 10 MG (21) TBPK tablet Take as directed (Patient not taking: Reported on 03/04/2018)   Facility-Administered Encounter Medications as of 06/24/2018  Medication  . 0.9 %  sodium chloride infusion    Activities of Daily Living In your present state of health, do you have any difficulty performing the following activities: 06/24/2018  Hearing? Y  Comment hearing aides in both ears  Vision? N  Comment has reading glasses  Difficulty concentrating or making decisions? Y  Comment makes notes  Walking or climbing stairs? Y  Comment cautious due to vertigo  Dressing or bathing? N  Doing errands, shopping? N  Preparing Food and eating ? N  Using the Toilet? N  In the past six months, have you accidently leaked urine? N  Do you have problems with loss of bowel control? N  Managing your Medications? N  Managing your Finances? N  Housekeeping or managing your Housekeeping? N  Some recent data might be hidden    Patient Care Team: [name], [place] as PCP - General (General  Practice) [name], FNP (General Practice)    Assessment:   This is a routine wellness examination for [place].  Exercise Activities and Dietary recommendations Current Exercise Habits: The patient does not participate in regular exercise at present  Goals    . Patient Stated     To lose weight       Fall Risk Fall Risk  06/24/2018 06/23/2018 06/01/2018 05/04/2018 03/04/2018  Falls in the past year? 1 0 0 0 0  Number falls in past yr: 0 - - - -  Comment slipped in the kitchen - - - -  Injury with Fall? 0 - - - -  Risk for fall due to : History of fall(s);Medication side effect - - - -  Follow up Falls prevention discussed - - - -   Is the patient's home free of loose throw rugs in walkways, pet beds, electrical cords, etc?   yes      Grab bars in the bathroom? yes      Handrails on the stairs?   yes      Adequate lighting?   yes  Timed Get Up and Go performed: n/a  Depression Screen PHQ 2/9 Scores 06/24/2018 06/23/2018 06/01/2018 05/04/2018  PHQ - 2 Score 3 0 0 0  PHQ- 9 Score 12 - - -     Cognitive Function     6CIT Screen 06/24/2018  What Year? 0 points  What month? 0 points  What time? 0 points  Count back from 20 0 points  Months in reverse 0 points  Repeat phrase 0 points  Total Score 0    Immunization History  Administered Date(s) Administered  . Influenza, High Dose Seasonal PF 11/05/2017    Qualifies for Shingles Vaccine? yes  Screening Tests Health Maintenance  Topic Date Due  . TETANUS/TDAP  04/12/1959  . PNA vac Low Risk Adult (1 of 2 - PCV13) 04/11/2005  . INFLUENZA VACCINE  08/29/2018  . OPHTHALMOLOGY EXAM  12/17/2018  . HEMOGLOBIN A1C  12/24/2018  . FOOT EXAM  06/23/2019  . DEXA SCAN  Completed    Cancer Screenings: Lung: Low Dose  CT Chest recommended if Age 57-80 years, 30 pack-year currently smoking OR have quit w/in 15years. Patient does not qualify. Breast:  Up to date on Mammogram? Yes   Up to date of Bone Density/Dexa? Yes Colorectal:  not required  Additional Screenings: : Hepatitis C Screening: n/a     Plan:    States mammogram scheduled for next month. Wants to lose weight.  I have personally reviewed and noted the following in the patient's chart:   . Medical and social history . Use of alcohol, tobacco or illicit drugs  . Current medications and supplements . Functional ability and status . Nutritional status . Physical activity . Advanced directives . List of other physicians . Hospitalizations, surgeries, and ER visits in previous 12 months . Vitals . Screenings to include cognitive, depression, and falls . Referrals and appointments  In addition, I have reviewed and discussed with patient certain preventive protocols, quality metrics, and best practice recommendations. A written personalized care plan for preventive services as well as general preventive health recommendations were provided to patient.     [name], LPN  [id number]

## 2018-06-25 LAB — URINE CULTURE

## 2018-06-26 ENCOUNTER — Encounter: Payer: Self-pay | Admitting: Nurse Practitioner

## 2018-06-29 ENCOUNTER — Other Ambulatory Visit: Payer: Self-pay | Admitting: Internal Medicine

## 2018-06-29 DIAGNOSIS — Z1231 Encounter for screening mammogram for malignant neoplasm of breast: Secondary | ICD-10-CM

## 2018-07-06 ENCOUNTER — Other Ambulatory Visit: Payer: Self-pay

## 2018-07-06 ENCOUNTER — Ambulatory Visit
Admission: RE | Admit: 2018-07-06 | Discharge: 2018-07-06 | Disposition: A | Discharge status: Home / Self Care | Payer: Medicare Other | Source: Ambulatory Visit | Attending: Internal Medicine | Admitting: Internal Medicine

## 2018-07-06 DIAGNOSIS — Z1231 Encounter for screening mammogram for malignant neoplasm of breast: Secondary | ICD-10-CM

## 2018-07-07 ENCOUNTER — Ambulatory Visit: Payer: Medicare Other

## 2018-07-07 VITALS — BP 132/80 | HR 69

## 2018-07-07 DIAGNOSIS — I10 Essential (primary) hypertension: Secondary | ICD-10-CM

## 2018-07-07 NOTE — Progress Notes (Signed)
Patient presented today for a blood pressure check.

## 2018-07-20 ENCOUNTER — Other Ambulatory Visit: Payer: Self-pay | Admitting: Nurse Practitioner

## 2018-08-13 ENCOUNTER — Other Ambulatory Visit: Payer: Self-pay | Admitting: Nurse Practitioner

## 2018-08-13 ENCOUNTER — Ambulatory Visit (HOSPITAL_COMMUNITY)
Admission: EM | Admit: 2018-08-13 | Discharge: 2018-08-13 | Disposition: A | Discharge status: Home / Self Care | Payer: Medicare Other | Attending: Internal Medicine | Admitting: Internal Medicine

## 2018-08-13 ENCOUNTER — Encounter (HOSPITAL_COMMUNITY): Payer: Self-pay | Admitting: Emergency Medicine

## 2018-08-13 ENCOUNTER — Ambulatory Visit (INDEPENDENT_AMBULATORY_CARE_PROVIDER_SITE_OTHER): Payer: Medicare Other

## 2018-08-13 ENCOUNTER — Other Ambulatory Visit: Payer: Self-pay

## 2018-08-13 DIAGNOSIS — S93412A Sprain of calcaneofibular ligament of left ankle, initial encounter: Secondary | ICD-10-CM | POA: Diagnosis not present

## 2018-08-13 DIAGNOSIS — I10 Essential (primary) hypertension: Secondary | ICD-10-CM

## 2018-08-13 MED ORDER — ACETAMINOPHEN 500 MG PO TABS: 500.0000 mg | ORAL_TABLET | Freq: Four times a day (QID) | ORAL | 0 refills | Status: AC | PRN

## 2018-08-13 NOTE — ED Provider Notes (Signed)
[name]    CSN: [id number] Arrival date & time: 08/13/18  1719      History   Chief Complaint Chief Complaint  Patient presents with  . Toe Pain    HPI Donna Gilmore is a 78 y.o. female with a history of hypertension-uncontrolled, hyperlipidemia-controlled comes to urgent care with complaint of a 1 day history of left foot pain.  Patient apparently hit her foot on a bedpost last night and has since had severe pain in the left foot and left ankle.  Pain is constant and severe.  Pain is aggravated by walking.  No relieving factors.  She has not tried any over-the-counter agents.  She complains of numbness in her toes.   No bruising on the foot or ankle.  HPI  Past Medical History:  Diagnosis Date  . Benign paroxysmal positional vertigo 08/26/2012  . CVA (cerebral infarction) 08/25/2012  . Dyslipidemia 08/25/2012  . Essential hypertension, benign 08/25/2012  . Hyperlipidemia   . Hypertension   . Meralgia paraesthetica, left 01/12/2018  . Stroke [facility])     Patient Active Problem List   Diagnosis Date Noted  . Chronic bilateral low back pain without sciatica 06/01/2018  . Class 1 obesity due to excess calories without serious comorbidity with body mass index (BMI) of 32.0 to 32.9 in adult 06/01/2018  . Acute left-sided low back pain with left-sided sciatica 05/04/2018  . Shortness of breath 05/04/2018  . Meralgia paraesthetica, left 01/12/2018  . MCI (mild cognitive impairment) 10/14/2016  . History of stroke 10/14/2016  . History of syphilis 10/14/2016  . Diabetes mellitus ([place]) 10/14/2016  . Mixed hyperlipidemia 10/14/2016  . Insomnia 10/14/2016  . Cerebellar stroke syndrome 10/23/2013  . Smoker 02/16/2013  . Chest pain 09/21/2012  . Tobacco abuse disorder 09/21/2012  . Benign paroxysmal positional vertigo 08/26/2012  . CVA (cerebral infarction) 08/25/2012  . Essential hypertension 08/25/2012  . Dyslipidemia 08/25/2012    History reviewed. No  pertinent surgical history.  OB History   No obstetric history on file.      Home Medications    Prior to Admission medications   Medication Sig Start Date End Date Taking? Authorizing Provider  albuterol (PROAIR HFA) 108 (90 Base) MCG/ACT inhaler Inhale 2 puffs into the lungs every 6 (six) hours as needed for wheezing or shortness of breath. 03/04/18   Rodriguez[place], Sunday Spillers, PA-C  atorvastatin (LIPITOR) 80 MG tablet TAKE 1 TABLET BY MOUTH  DAILY 07/20/18   [name], FNP  clopidogrel (PLAVIX) 75 MG tablet TAKE 1 TABLET BY MOUTH  DAILY 06/15/18   Glendale Chard, MD  cyclobenzaprine (FLEXERIL) 5 MG tablet Take 1 tablet (5 mg total) by mouth 3 (three) times daily as needed for muscle spasms. 06/23/18   [name], FNP  donepezil (ARICEPT) 5 MG tablet Take 1 tablet (5 mg total) by mouth at bedtime. Patient not taking: Reported on 06/24/2018 11/05/17 11/05/18  [name], FNP  fluticasone [facility]) 50 MCG/ACT nasal spray Place 1 spray into both nostrils daily. 04/07/15   Melony Overly, MD  Ginkgo Biloba 40 MG TABS Take by mouth.    [provider]  levocetirizine (XYZAL) 5 MG tablet TAKE 1 TABLET BY MOUTH  EVERY DAY IN THE EVENING 12/24/17   [name], FNP  meclizine (ANTIVERT) 25 MG tablet TAKE 1 TABLET BY MOUTH 2  TIMES EVERY DAY AS NEEDED 07/20/18   [name], FNP  olmesartan (BENICAR) 40 MG tablet Take 0.5 tablets (20 mg total) by mouth  daily. 06/23/18   [name], [place] THE COUNTER MEDICATION     [provider]  vitamin C (ASCORBIC ACID) 500 MG tablet Take 500 mg by mouth daily.    [provider]    Family History Family History  Problem Relation Age of Onset  . Kidney failure Mother   . Diabetes Mother   . Alzheimer's disease Mother   . Alzheimer's disease Father   . Prostate cancer Father   . Diabetes Maternal Grandmother   . Diabetes Maternal Grandfather   . Alzheimer's disease Paternal Grandmother   . Alzheimer's disease  Paternal Grandfather   . Prostate cancer Paternal Uncle     Social History Social History   Tobacco Use  . Smoking status: Former Smoker    Packs/day: 0.30    Years: 30.00    Pack years: 9.00    Types: Cigarettes    Quit date: 2019    Years since quitting: 1.5  . Smokeless tobacco: Never Used  Substance Use Topics  . Alcohol use: Yes    Comment: occasional  . Drug use: No     Allergies   Patient has no known allergies.   Review of Systems Review of Systems  Constitutional: Negative for activity change, chills, diaphoresis and fatigue.  HENT: Negative.   Cardiovascular: Negative.   Gastrointestinal: Negative.   Genitourinary: Negative.   Musculoskeletal: Positive for arthralgias and joint swelling. Negative for myalgias.  Skin: Negative for pallor, rash and wound.  Neurological: Negative for dizziness, weakness, numbness and headaches.     Physical Exam Triage Vital Signs ED Triage Vitals  Enc Vitals Group     BP 08/13/18 1732 (!) 160/87     Pulse Rate 08/13/18 1732 96     Resp 08/13/18 1732 18     Temp 08/13/18 1732 98.9 F (37.2 C)     Temp Source 08/13/18 1732 Oral     SpO2 08/13/18 1732 100 %     Weight --      Height --      Head Circumference --      Peak Flow --      Pain Score 08/13/18 1733 6     Pain Loc --      Pain Edu? --      Excl. in [place]? --    No data found.  Updated Vital Signs BP (!) 160/87 (BP Location: Left Arm)   Pulse 96   Temp 98.9 F (37.2 C) (Oral)   Resp 18   SpO2 100%   Visual Acuity Right Eye Distance:   Left Eye Distance:   Bilateral Distance:    Right Eye Near:   Left Eye Near:    Bilateral Near:     Physical Exam Vitals signs and nursing note reviewed.  Constitutional:      General: She is in acute distress.     Appearance: She is not ill-appearing or diaphoretic.  Cardiovascular:     Rate and Rhythm: Normal rate and regular rhythm.     Pulses: Normal pulses.     Heart sounds: Normal heart sounds.   Pulmonary:     Effort: Pulmonary effort is normal.     Breath sounds: Normal breath sounds.  Abdominal:     General: Bowel sounds are normal. There is no distension.     Palpations: Abdomen is soft.     Tenderness: There is no abdominal tenderness.  Musculoskeletal:        General: Swelling, tenderness  and signs of injury present.     Left lower leg: Edema present.     Comments: Limited range of motion left ankle  Skin:    Capillary Refill: Capillary refill takes less than 2 seconds.  Neurological:     General: No focal deficit present.     Mental Status: She is alert and oriented to person, place, and time.      UC Treatments / Results  Labs (all labs ordered are listed, but only abnormal results are displayed) Labs Reviewed - No data to display  EKG   Radiology No results found.  Procedures Procedures (including critical care time)  Medications Ordered in UC Medications - No data to display  Initial Impression / Assessment and Plan / UC Course  I have reviewed the triage vital signs and the nursing notes.  Pertinent labs & imaging results that were available during my care of the patient were reviewed by me and considered in my medical decision making (see chart for details).     1.  Left ankle sprain Extra strength Tylenol 6 hours as needed for pain X-ray of the left ankle is negative for acute fracture Apply CAM Walker Rest, elevate, ice Gentle range of motion exercises Final Clinical Impressions(s) / UC Diagnoses   Final diagnoses:  None   Discharge Instructions   None    ED Prescriptions    None     Controlled Substance Prescriptions [place] Controlled Substance Registry consulted? No   Chase Picket, MD 08/13/18 Drema Halon

## 2018-08-13 NOTE — ED Triage Notes (Signed)
Pt here for left 5th toe pain after injuring last night

## 2018-08-14 ENCOUNTER — Telehealth (HOSPITAL_COMMUNITY): Payer: Self-pay | Admitting: Internal Medicine

## 2018-08-14 NOTE — Telephone Encounter (Signed)
Final report of left foot x-ray is remarkable for minimally displaced fracture through the shaft of the left fifth proximal phalanx.  patient and relayed the results to her.  She directed me to call her daughter Olivia Mackie on [phone]. I called Traci and left a message. I am waiting for callback from Kenneth..  Patient will need follow-up with orthopedic surgery.

## 2018-08-14 NOTE — Telephone Encounter (Signed)
I spoke with the [facility] who will see the patient in the office on Monday, 08/17/2018.  [facility] will call the patient to inform her about the visit on Monday

## 2018-08-19 ENCOUNTER — Telehealth: Payer: Self-pay | Admitting: Nurse Practitioner

## 2018-08-19 ENCOUNTER — Telehealth: Payer: Self-pay

## 2018-08-19 NOTE — Telephone Encounter (Signed)
Called pt and left v/m to call the office. YRL,RMA

## 2018-08-19 NOTE — Telephone Encounter (Signed)
PT NEEDS REFILL OF OLMESARTAN 40MG  SENT INTO LOCAL PHARMACY DUE TO SHE IS OUT AND MAIL ORDER WOULD NOT B HER TILL FRIDAY

## 2018-08-19 NOTE — Telephone Encounter (Signed)
I called pt to confirm how she is taking olmesartan. She is supposed to be taking olmesartan 40mg  and not half a tablet.i have left pt a v/m to call the office [facility]

## 2018-08-21 DIAGNOSIS — M79672 Pain in left foot: Secondary | ICD-10-CM | POA: Insufficient documentation

## 2018-08-25 ENCOUNTER — Encounter: Payer: Self-pay | Admitting: Nurse Practitioner

## 2018-08-25 ENCOUNTER — Other Ambulatory Visit: Payer: Self-pay | Admitting: Internal Medicine

## 2018-08-25 ENCOUNTER — Other Ambulatory Visit: Payer: Self-pay | Admitting: Nurse Practitioner

## 2018-08-25 DIAGNOSIS — I10 Essential (primary) hypertension: Secondary | ICD-10-CM

## 2018-08-25 DIAGNOSIS — R413 Other amnesia: Secondary | ICD-10-CM

## 2018-08-25 MED ORDER — OLMESARTAN MEDOXOMIL 40 MG PO TABS: 20.0000 mg | ORAL_TABLET | Freq: Every day | ORAL | 1 refills | Status: DC

## 2018-09-11 ENCOUNTER — Encounter: Payer: Self-pay | Admitting: Nurse Practitioner

## 2018-09-15 ENCOUNTER — Encounter: Payer: Self-pay | Admitting: Nurse Practitioner

## 2018-09-15 ENCOUNTER — Ambulatory Visit (INDEPENDENT_AMBULATORY_CARE_PROVIDER_SITE_OTHER): Payer: Medicare Other | Admitting: Nurse Practitioner

## 2018-09-15 ENCOUNTER — Other Ambulatory Visit: Payer: Self-pay

## 2018-09-15 VITALS — BP 160/102 | HR 62 | Temp 97.8°F | Ht 62.8 in | Wt 178.0 lb

## 2018-09-15 DIAGNOSIS — G8929 Other chronic pain: Secondary | ICD-10-CM

## 2018-09-15 DIAGNOSIS — M545 Low back pain: Secondary | ICD-10-CM | POA: Diagnosis not present

## 2018-09-15 DIAGNOSIS — I10 Essential (primary) hypertension: Secondary | ICD-10-CM

## 2018-09-15 DIAGNOSIS — L659 Nonscarring hair loss, unspecified: Secondary | ICD-10-CM

## 2018-09-15 MED ORDER — HYDROCHLOROTHIAZIDE 12.5 MG PO TABS: 12.5000 mg | ORAL_TABLET | Freq: Every day | ORAL | 2 refills | Status: DC

## 2018-09-15 MED ORDER — OLMESARTAN MEDOXOMIL 40 MG PO TABS: 40.0000 mg | ORAL_TABLET | Freq: Every day | ORAL | 1 refills | Status: DC

## 2018-09-15 NOTE — Progress Notes (Signed)
Subjective:     Patient ID: Donna Gilmore , female    DOB: [birth year] , 78 y.o.   MRN: [id number]   Chief Complaint  Patient presents with  . Back Pain    patient states her back has been hurting for the past couple of months. the pain radiates down to her left leg. patient would like a referral to the pain clinic    HPI  Back Pain This is a chronic problem. The current episode started more than 1 year ago. The problem occurs intermittently. The problem has been waxing and waning since onset. The pain is present in the lumbar spine. The quality of the pain is described as aching. The pain does not radiate. The pain is the same all the time. Pertinent negatives include no abdominal pain, headaches, tingling or weakness. Risk factors include sedentary lifestyle. She has tried nothing for the symptoms. The treatment provided mild relief.  Hypertension This is a chronic problem. The current episode started more than 1 year ago. The problem has been gradually worsening since onset. The problem is uncontrolled. Pertinent negatives include no anxiety, headaches or malaise/fatigue. There are no associated agents to hypertension. Risk factors for coronary artery disease include obesity and sedentary lifestyle. Past treatments include angiotensin blockers. There are no compliance problems.  There is no history of angina or kidney disease.     Past Medical History:  Diagnosis Date  . Benign paroxysmal positional vertigo 08/26/2012  . CVA (cerebral infarction) 08/25/2012  . Dyslipidemia 08/25/2012  . Essential hypertension, benign 08/25/2012  . Hyperlipidemia   . Hypertension   . Meralgia paraesthetica, left 01/12/2018  . Stroke [facility])      Family History  Problem Relation Age of Onset  . Kidney failure Mother   . Diabetes Mother   . Alzheimer's disease Mother   . Alzheimer's disease Father   . Prostate cancer Father   . Diabetes Maternal Grandmother   . Diabetes Maternal Grandfather   .  Alzheimer's disease Paternal Grandmother   . Alzheimer's disease Paternal Grandfather   . Prostate cancer Paternal Uncle      Current Outpatient Medications:  .  acetaminophen (TYLENOL) 500 MG tablet, Take 1 tablet (500 mg total) by mouth every 6 (six) hours as needed., Disp: 30 tablet, Rfl: 0 .  atorvastatin (LIPITOR) 80 MG tablet, TAKE 1 TABLET BY MOUTH  DAILY, Disp: 90 tablet, Rfl: 0 .  clopidogrel (PLAVIX) 75 MG tablet, TAKE 1 TABLET BY MOUTH  DAILY, Disp: 90 tablet, Rfl: 2 .  fluticasone (FLONASE) 50 MCG/ACT nasal spray, Place 1 spray into both nostrils daily., Disp: 16 g, Rfl: 0 .  Ginkgo Biloba 40 MG TABS, Take by mouth., Disp: , Rfl:  .  levocetirizine (XYZAL) 5 MG tablet, TAKE 1 TABLET BY MOUTH  EVERY DAY IN THE EVENING, Disp: 90 tablet, Rfl: 1 .  meclizine (ANTIVERT) 25 MG tablet, TAKE 1 TABLET BY MOUTH 2  TIMES EVERY DAY AS NEEDED, Disp: 90 tablet, Rfl: 0 .  olmesartan (BENICAR) 40 MG tablet, Take 0.5 tablets (20 mg total) by mouth daily., Disp: 90 tablet, Rfl: 1 .  ONETOUCH ULTRA test strip, USE WITH METER TO CHECK  BLOOD SUGAR BEFORE  BREAKFAST AND BEFORE DINNER, Disp: 200 strip, Rfl: 3 .  OVER THE COUNTER MEDICATION, , Disp: , Rfl:  .  vitamin C (ASCORBIC ACID) 500 MG tablet, Take 500 mg by mouth daily., Disp: , Rfl:  .  albuterol (PROAIR HFA) 108 (90 Base) MCG/ACT inhaler,  Inhale 2 puffs into the lungs every 6 (six) hours as needed for wheezing or shortness of breath. (Patient not taking: Reported on 09/15/2018), Disp: 2 Inhaler, Rfl: 1  Current Facility-Administered Medications:  .  0.9 %  sodium chloride infusion, 500 mL, Intravenous, Once, [name], MD   No Known Allergies   Review of Systems  Constitutional: Negative.  Negative for malaise/fatigue.  Respiratory: Negative.   Cardiovascular: Negative.   Gastrointestinal: Negative for abdominal pain.  Musculoskeletal: Positive for back pain.  Neurological: Negative for tingling, weakness and headaches.   Psychiatric/Behavioral: Negative.      Today's Vitals   09/15/18 1151  BP: (!) 160/102  Pulse: 62  Temp: 97.8 F (36.6 C)  TempSrc: Oral  Weight: 178 lb (80.7 kg)  Height: 5' 2.8" (1.595 m)  PainSc: 0-No pain   Body mass index is 31.73 kg/m.   Objective:  Physical Exam Constitutional:      Appearance: Normal appearance.  Cardiovascular:     Rate and Rhythm: Normal rate and regular rhythm.     Pulses: Normal pulses.     Heart sounds: Normal heart sounds. No murmur.  Pulmonary:     Effort: Pulmonary effort is normal.     Breath sounds: Normal breath sounds.  Skin:    General: Skin is warm and dry.     Capillary Refill: Capillary refill takes less than 2 seconds.  Neurological:     General: No focal deficit present.     Mental Status: She is alert and oriented to person, place, and time.  Psychiatric:        Mood and Affect: Mood normal.        Behavior: Behavior normal.        Thought Content: Thought content normal.        Judgment: Judgment normal.         Assessment And Plan:    1. Essential hypertension  Blood pressure has been increasing in the last several months she is taking olmesartan 40 mg once a day  I will add HCTZ 12.5 mg to her regimen. She is advised to increase her intake of potassium rich foods and stay well hydrated.   She will call on Monday with her blood pressure readings  She will return in 4 weeks for BMP and office visit for new medication - olmesartan (BENICAR) 40 MG tablet; Take 1 tablet (40 mg total) by mouth daily.  Dispense: 90 tablet; Refill: 1 - hydrochlorothiazide (HYDRODIURIL) 12.5 MG tablet; Take 1 tablet (12.5 mg total) by mouth daily.  Dispense: 30 tablet; Refill: 2  2. Chronic bilateral low back pain without sciatica  She has continued low back pain  She would like a referral to Dr. Vira Blanco for further evaluation - Ambulatory referral to Pain Clinic  3. Hair loss  Will check thyroid level  Hair has been thinning  over the last few months and is concerned about her thyroid levels.   - TSH   [name], FNP    THE PATIENT IS ENCOURAGED TO PRACTICE SOCIAL DISTANCING DUE TO THE COVID-19 PANDEMIC.

## 2018-09-15 NOTE — Patient Instructions (Addendum)
   Check blood pressure daily and Dia Sitter will call on Monday for the readings.    Take Hydrochlorthiazide once a day in the morning and increase your intake of high potassium foods such as oranges, bananas and beans.  Be sure to stay well hydrated.

## 2018-09-22 LAB — TSH

## 2018-09-25 ENCOUNTER — Encounter: Payer: Self-pay | Admitting: Nurse Practitioner

## 2018-09-30 ENCOUNTER — Encounter: Payer: Self-pay | Admitting: Nurse Practitioner

## 2018-10-26 ENCOUNTER — Other Ambulatory Visit: Payer: Self-pay

## 2018-10-26 ENCOUNTER — Ambulatory Visit (INDEPENDENT_AMBULATORY_CARE_PROVIDER_SITE_OTHER): Payer: Medicare Other | Admitting: Nurse Practitioner

## 2018-10-26 ENCOUNTER — Encounter: Payer: Self-pay | Admitting: Nurse Practitioner

## 2018-10-26 VITALS — BP 132/82 | HR 67 | Temp 98.0°F | Ht 63.2 in | Wt 175.8 lb

## 2018-10-26 DIAGNOSIS — I10 Essential (primary) hypertension: Secondary | ICD-10-CM

## 2018-10-26 DIAGNOSIS — M545 Low back pain: Secondary | ICD-10-CM

## 2018-10-26 DIAGNOSIS — G3184 Mild cognitive impairment, so stated: Secondary | ICD-10-CM | POA: Diagnosis not present

## 2018-10-26 DIAGNOSIS — R202 Paresthesia of skin: Secondary | ICD-10-CM

## 2018-10-26 DIAGNOSIS — G8929 Other chronic pain: Secondary | ICD-10-CM

## 2018-10-26 DIAGNOSIS — R7309 Other abnormal glucose: Secondary | ICD-10-CM

## 2018-10-26 DIAGNOSIS — E785 Hyperlipidemia, unspecified: Secondary | ICD-10-CM

## 2018-10-26 MED ORDER — OLMESARTAN MEDOXOMIL-HCTZ 40-12.5 MG PO TABS: 1.0000 | ORAL_TABLET | Freq: Every day | ORAL | 1 refills | Status: DC

## 2018-10-26 MED ORDER — DONEPEZIL HCL 5 MG PO TABS: 5.0000 mg | ORAL_TABLET | Freq: Every day | ORAL | 0 refills | Status: DC

## 2018-10-26 NOTE — Progress Notes (Signed)
Subjective:     Patient ID: Donna Gilmore , female    DOB: 06/10/1940 , 78 y.o.   MRN: [id number]   Chief Complaint  Patient presents with  . Hypertension    HPI  Blood pressure has been ranging 117-145/68-91  She has also been taking aricept 5 mg in the morning instead of the evening  Hypertension This is a chronic problem. The current episode started today. The problem has been gradually improving since onset. The problem is controlled. Pertinent negatives include no anxiety, chest pain, headaches, malaise/fatigue or palpitations. There are no associated agents to hypertension. There are no compliance problems.      Past Medical History:  Diagnosis Date  . Benign paroxysmal positional vertigo 08/26/2012  . CVA (cerebral infarction) 08/25/2012  . Dyslipidemia 08/25/2012  . Essential hypertension, benign 08/25/2012  . Hyperlipidemia   . Hypertension   . Meralgia paraesthetica, left 01/12/2018  . Stroke ([facility])      Family History  Problem Relation Age of Onset  . Kidney failure Mother   . Diabetes Mother   . Alzheimer's disease Mother   . Alzheimer's disease Father   . Prostate cancer Father   . Diabetes Maternal Grandmother   . Diabetes Maternal Grandfather   . Alzheimer's disease Paternal Grandmother   . Alzheimer's disease Paternal Grandfather   . Prostate cancer Paternal Uncle      Current Outpatient Medications:  .  acetaminophen (TYLENOL) 500 MG tablet, Take 1 tablet (500 mg total) by mouth every 6 (six) hours as needed., Disp: 30 tablet, Rfl: 0 .  albuterol (PROAIR HFA) 108 (90 Base) MCG/ACT inhaler, Inhale 2 puffs into the lungs every 6 (six) hours as needed for wheezing or shortness of breath., Disp: 2 Inhaler, Rfl: 1 .  atorvastatin (LIPITOR) 80 MG tablet, TAKE 1 TABLET BY MOUTH  DAILY, Disp: 90 tablet, Rfl: 0 .  clopidogrel (PLAVIX) 75 MG tablet, TAKE 1 TABLET BY MOUTH  DAILY, Disp: 90 tablet, Rfl: 2 .  donepezil (ARICEPT) 5 MG tablet, Take 5 mg by mouth  daily., Disp: , Rfl:  .  fluticasone (FLONASE) 50 MCG/ACT nasal spray, Place 1 spray into both nostrils daily., Disp: 16 g, Rfl: 0 .  hydrochlorothiazide (HYDRODIURIL) 12.5 MG tablet, Take 1 tablet (12.5 mg total) by mouth daily., Disp: 30 tablet, Rfl: 2 .  levocetirizine (XYZAL) 5 MG tablet, TAKE 1 TABLET BY MOUTH  EVERY DAY IN THE EVENING, Disp: 90 tablet, Rfl: 1 .  meclizine (ANTIVERT) 25 MG tablet, TAKE 1 TABLET BY MOUTH 2  TIMES EVERY DAY AS NEEDED, Disp: 90 tablet, Rfl: 0 .  olmesartan (BENICAR) 40 MG tablet, Take 1 tablet (40 mg total) by mouth daily., Disp: 90 tablet, Rfl: 1 .  ONETOUCH ULTRA test strip, USE WITH METER TO CHECK  BLOOD SUGAR BEFORE  BREAKFAST AND BEFORE DINNER, Disp: 200 strip, Rfl: 3 .  OVER THE COUNTER MEDICATION, , Disp: , Rfl:  .  vitamin C (ASCORBIC ACID) 500 MG tablet, Take 500 mg by mouth daily., Disp: , Rfl:  .  VITAMIN D PO, Take 1 tablet by mouth daily., Disp: , Rfl:  .  Ginkgo Biloba 40 MG TABS, Take by mouth., Disp: , Rfl:   Current Facility-Administered Medications:  .  0.9 %  sodium chloride infusion, 500 mL, Intravenous, Once, Perry, John N, MD   No Known Allergies   Review of Systems  Constitutional: Negative.  Negative for malaise/fatigue.  Respiratory: Negative.  Negative for cough.   Cardiovascular:   Negative.  Negative for chest pain, palpitations and leg swelling.  Endocrine: Negative for polydipsia, polyphagia and polyuria.  Musculoskeletal: Negative.   Skin: Negative.   Neurological: Negative.  Negative for dizziness and headaches.  Psychiatric/Behavioral: Negative.      Today's Vitals   10/26/18 0949  BP: 132/82  Pulse: 67  Temp: 98 F (36.7 C)  TempSrc: Oral  Weight: 175 lb 12.8 oz (79.7 kg)  Height: 5' 3.2" (1.605 m)  PainSc: 0-No pain   Body mass index is 30.94 kg/m.   Objective:  Physical Exam Vitals signs reviewed.  Constitutional:      Appearance: Normal appearance.  Cardiovascular:     Rate and Rhythm: Normal rate  and regular rhythm.     Pulses: Normal pulses.     Heart sounds: Normal heart sounds. No murmur.  Pulmonary:     Effort: Pulmonary effort is normal.     Breath sounds: Normal breath sounds.  Skin:    General: Skin is warm and dry.     Capillary Refill: Capillary refill takes less than 2 seconds.  Neurological:     General: No focal deficit present.     Mental Status: She is alert and oriented to person, place, and time.  Psychiatric:        Mood and Affect: Mood normal.        Behavior: Behavior normal.        Thought Content: Thought content normal.        Judgment: Judgment normal.         Assessment And Plan:     1. Essential hypertension  Her blood pressure has improved with the addition of HCTZ 12.5 I will combine the medications and send to [facility]  Will check BMP for kidney function as well - olmesartan-hydrochlorothiazide (BENICAR HCT) 40-12.5 MG tablet; Take 1 tablet by mouth daily.  Dispense: 90 tablet; Refill: 1  2. Mild cognitive impairment  She has started back taking Aricept, I will send to [facility] and she can take in the morning since this causes her vivid dreams - donepezil (ARICEPT) 5 MG tablet; Take 1 tablet (5 mg total) by mouth daily.  Dispense: 90 tablet; Refill: 0  3. Chronic bilateral low back pain without sciatica  She is being followed at the Pain clinic and there is a plan to do an MRI she is awaiting a call from them to schedule  4. Tingling in extremities  Unable to get TSH at last visit cancelled.   Will check today - TSH  5. Dyslipidemia  Chronic, stable,   Continue to avoid high fat foods - BMP8+eGFR - Lipid panel  6. Abnormal glucose  Chronic, stable.   Continue avoiding sugary foods and drinks - Hemoglobin A1c   , FNP    THE PATIENT IS ENCOURAGED TO PRACTICE SOCIAL DISTANCING DUE TO THE COVID-19 PANDEMIC.   

## 2018-10-26 NOTE — Patient Instructions (Signed)
   Good job with your blood pressure

## 2018-10-27 LAB — BMP8+EGFR
BUN/Creatinine Ratio: 12 (ref 12–28)
BUN: 14 mg/dL (ref 8–27)
CO2: 24 mmol/L (ref 20–29)
Calcium: 10 mg/dL (ref 8.7–10.3)
Chloride: 101 mmol/L (ref 96–106)
Creatinine, Ser: 1.19 mg/dL — ABNORMAL HIGH (ref 0.57–1.00)
GFR calc Af Amer: 51 mL/min/{1.73_m2} — ABNORMAL LOW (ref >59)
GFR calc non Af Amer: 44 mL/min/{1.73_m2} — ABNORMAL LOW (ref >59)
Glucose: 96 mg/dL (ref 65–99)
Potassium: 3.8 mmol/L (ref 3.5–5.2)
Sodium: 141 mmol/L (ref 134–144)

## 2018-10-27 LAB — HEMOGLOBIN A1C
Est. average glucose Bld gHb Est-mCnc: 151 mg/dL
Hgb A1c MFr Bld: 6.9 % — ABNORMAL HIGH (ref 4.8–5.6)

## 2018-10-27 LAB — TSH: TSH: 2.8 u[IU]/mL (ref 0.450–4.500)

## 2018-10-27 LAB — LIPID PANEL
Chol/HDL Ratio: 3.7 ratio (ref 0.0–4.4)
Cholesterol, Total: 189 mg/dL (ref 100–199)
HDL: 51 mg/dL (ref >39)
LDL Chol Calc (NIH): 107 mg/dL — ABNORMAL HIGH (ref 0–99)
Triglycerides: 181 mg/dL — ABNORMAL HIGH (ref 0–149)
VLDL Cholesterol Cal: 31 mg/dL (ref 5–40)

## 2018-10-29 ENCOUNTER — Encounter: Payer: Self-pay | Admitting: Nurse Practitioner

## 2018-11-14 ENCOUNTER — Other Ambulatory Visit: Payer: Self-pay | Admitting: Nurse Practitioner

## 2018-11-16 ENCOUNTER — Other Ambulatory Visit: Payer: Self-pay | Admitting: Pain Medicine

## 2018-11-16 DIAGNOSIS — M545 Low back pain, unspecified: Secondary | ICD-10-CM

## 2018-11-20 ENCOUNTER — Encounter: Payer: Self-pay | Admitting: Nurse Practitioner

## 2018-11-21 ENCOUNTER — Telehealth: Payer: Self-pay

## 2018-11-21 NOTE — Telephone Encounter (Signed)
Pt left information via mychart   vitals last 2 days.   Date    time   sugar  Blood Pressure   Pulse  Temp  10/22  10:00  110    120/80             58      97.7  10/23  10:10  115    138/83            66      97.7  10/23   4:40p  131    108/72            88       97.5  feel dizzy, lt headed, runny nose and eyes last couple weeks, no energy  Have MRI test at [facility] 10/25 @ 3:40

## 2018-11-22 ENCOUNTER — Ambulatory Visit
Admission: RE | Admit: 2018-11-22 | Discharge: 2018-11-22 | Disposition: A | Discharge status: Home / Self Care | Payer: Medicare Other | Source: Ambulatory Visit | Attending: Pain Medicine | Admitting: Pain Medicine

## 2018-11-22 ENCOUNTER — Other Ambulatory Visit: Payer: Self-pay

## 2018-11-22 DIAGNOSIS — M545 Low back pain, unspecified: Secondary | ICD-10-CM

## 2018-11-24 ENCOUNTER — Encounter: Payer: Self-pay | Admitting: Nurse Practitioner

## 2018-11-25 ENCOUNTER — Telehealth: Payer: Self-pay | Admitting: Nurse Practitioner

## 2018-11-29 ENCOUNTER — Other Ambulatory Visit: Payer: Self-pay | Admitting: Nurse Practitioner

## 2018-12-15 ENCOUNTER — Other Ambulatory Visit: Payer: Self-pay | Admitting: Nurse Practitioner

## 2018-12-15 DIAGNOSIS — G3184 Mild cognitive impairment, so stated: Secondary | ICD-10-CM

## 2018-12-21 ENCOUNTER — Encounter: Payer: Self-pay | Admitting: Nurse Practitioner

## 2019-01-12 ENCOUNTER — Other Ambulatory Visit: Payer: Self-pay | Admitting: Nurse Practitioner

## 2019-01-16 ENCOUNTER — Encounter: Payer: Self-pay | Admitting: Nurse Practitioner

## 2019-01-18 NOTE — Telephone Encounter (Signed)
She may need an office visit when I return

## 2019-02-01 ENCOUNTER — Ambulatory Visit: Payer: Self-pay | Admitting: Nurse Practitioner

## 2019-02-17 ENCOUNTER — Encounter: Payer: Self-pay | Admitting: Nurse Practitioner

## 2019-02-25 ENCOUNTER — Ambulatory Visit
Admission: RE | Admit: 2019-02-25 | Discharge: 2019-02-25 | Disposition: A | Discharge status: Home / Self Care | Payer: Medicare Other | Source: Ambulatory Visit | Attending: Nurse Practitioner | Admitting: Nurse Practitioner

## 2019-02-25 ENCOUNTER — Ambulatory Visit (INDEPENDENT_AMBULATORY_CARE_PROVIDER_SITE_OTHER): Payer: Medicare Other | Admitting: Nurse Practitioner

## 2019-02-25 ENCOUNTER — Other Ambulatory Visit: Payer: Self-pay

## 2019-02-25 ENCOUNTER — Encounter: Payer: Self-pay | Admitting: Nurse Practitioner

## 2019-02-25 VITALS — BP 122/78 | HR 95 | Temp 98.3°F | Ht 63.4 in | Wt 183.2 lb

## 2019-02-25 DIAGNOSIS — K5900 Constipation, unspecified: Secondary | ICD-10-CM

## 2019-02-25 DIAGNOSIS — I10 Essential (primary) hypertension: Secondary | ICD-10-CM

## 2019-02-25 DIAGNOSIS — R0600 Dyspnea, unspecified: Secondary | ICD-10-CM

## 2019-02-25 DIAGNOSIS — R7309 Other abnormal glucose: Secondary | ICD-10-CM

## 2019-02-25 DIAGNOSIS — E782 Mixed hyperlipidemia: Secondary | ICD-10-CM

## 2019-02-25 DIAGNOSIS — J310 Chronic rhinitis: Secondary | ICD-10-CM

## 2019-02-25 DIAGNOSIS — J449 Chronic obstructive pulmonary disease, unspecified: Secondary | ICD-10-CM | POA: Insufficient documentation

## 2019-02-25 DIAGNOSIS — R0609 Other forms of dyspnea: Secondary | ICD-10-CM

## 2019-02-25 DIAGNOSIS — I739 Peripheral vascular disease, unspecified: Secondary | ICD-10-CM | POA: Insufficient documentation

## 2019-02-25 MED ORDER — AZELASTINE HCL 137 MCG/SPRAY NA SOLN: 2.0000 | Freq: Every day | NASAL | 2 refills | Status: DC

## 2019-02-25 MED ORDER — BUDESONIDE-FORMOTEROL FUMARATE 80-4.5 MCG/ACT IN AERO: 2.0000 | INHALATION_SPRAY | Freq: Every day | RESPIRATORY_TRACT | 2 refills | Status: DC

## 2019-02-25 NOTE — Progress Notes (Signed)
Subjective:     Patient ID: Donna Gilmore , female    DOB: [birth year] , 79 y.o.   MRN: [id number]   Chief Complaint  Patient presents with  . Hypertension    patient presents today for a 4 month f/u  . abnormal glucose    HPI  She feels she is doing well with her back - voltaren gel and XSCBD Procream.    Wt Readings from Last 3 Encounters: 02/25/19 : 183 lb 3.2 oz (83.1 kg) 10/26/18 : 175 lb 12.8 oz (79.7 kg) 09/15/18 : 178 lb (80.7 kg)  She wants to lose 26 pounds.    She is having runny nose and eyes running, she says has been ongoing for months.  She is having itching eyes.   Hypertension This is a chronic problem. The current episode started today. The problem has been gradually improving since onset. The problem is controlled. Pertinent negatives include no anxiety, chest pain, headaches, malaise/fatigue or palpitations. There are no associated agents to hypertension. There are no known risk factors for coronary artery disease. There are no compliance problems.  There is no history of angina. There is no history of chronic renal disease.  Constipation This is a new problem. The current episode started more than 1 month ago (2 months ago). The problem has been gradually worsening since onset. Her stool frequency is 4 to 5 times per week. The stool is described as loose. The patient is not on a high fiber diet. She does not exercise regularly. There has been adequate water intake. Pertinent negatives include no abdominal pain, fecal incontinence or nausea.     Past Medical History:  Diagnosis Date  . Benign paroxysmal positional vertigo 08/26/2012  . CVA (cerebral infarction) 08/25/2012  . Dyslipidemia 08/25/2012  . Essential hypertension, benign 08/25/2012  . Hyperlipidemia   . Hypertension   . Meralgia paraesthetica, left 01/12/2018  . [facility]roke [facility]      Family History  Problem Relation Age of Onset  . Kidney failure Mother   . Diabetes Mother   . Alzheimer's  disease Mother   . Alzheimer's disease Father   . Prostate cancer Father   . Diabetes Maternal Grandmother   . Diabetes Maternal Grandfather   . Alzheimer's disease Paternal Grandmother   . Alzheimer's disease Paternal Grandfather   . Prostate cancer Paternal Uncle      Current Outpatient Medications:  .  acetaminophen (TYLENOL) 500 MG tablet, Take 1 tablet (500 mg total) by mouth every 6 (six) hours as needed., Disp: 30 tablet, Rfl: 0 .  albuterol (PROAIR HFA) 108 (90 Base) MCG/ACT inhaler, Inhale 2 puffs into the lungs every 6 (six) hours as needed for wheezing or shortness of breath., Disp: 2 Inhaler, Rfl: 1 .  atorvastatin (LIPITOR) 80 MG tablet, TAKE 1 TABLET BY MOUTH  DAILY, Disp: 90 tablet, Rfl: 3 .  clopidogrel (PLAVIX) 75 MG tablet, TAKE 1 TABLET BY MOUTH  DAILY, Disp: 90 tablet, Rfl: 2 .  diazepam (VALIUM) 5 MG tablet, Take 5 mg by mouth as needed for anxiety., Disp: , Rfl:  .  diclofenac Sodium (VOLTAREN) 1 % GEL, Apply topically 4 (four) times daily., Disp: , Rfl:  .  donepezil (ARICEPT) 5 MG tablet, TAKE 1 TABLET BY MOUTH  DAILY, Disp: 90 tablet, Rfl: 3 .  fluticasone (FLONASE) 50 MCG/ACT nasal spray, Place 1 spray into both nostrils daily., Disp: 16 g, Rfl: 0 .  gabapentin (NEURONTIN) 100 MG capsule, Take 100 mg by mouth 3 (  three) times daily as needed., Disp: , Rfl:  .  levocetirizine (XYZAL) 5 MG tablet, TAKE 1 TABLET BY MOUTH  DAILY IN THE EVENING, Disp: 90 tablet, Rfl: 1 .  meclizine (ANTIVERT) 25 MG tablet, TAKE 1 TABLET BY MOUTH 2  TIMES EVERY DAY AS NEEDED, Disp: 90 tablet, Rfl: 0 .  olmesartan-hydrochlorothiazide (BENICAR HCT) 40-12.5 MG tablet, Take 1 tablet by mouth daily., Disp: 90 tablet, Rfl: 1 .  ONETOUCH ULTRA test strip, USE WITH METER TO CHECK  BLOOD SUGAR BEFORE  BREAKFAST AND BEFORE DINNER, Disp: 200 strip, Rfl: 3 .  OVER THE COUNTER MEDICATION, , Disp: , Rfl:  .  Probiotic Product (PROBIOTIC-10 PO), Take by mouth. Take one tablet daily, Disp: , Rfl:  .   vitamin C (ASCORBIC ACID) 500 MG tablet, Take 500 mg by mouth daily., Disp: , Rfl:  .  VITAMIN D PO, Take 1 tablet by mouth daily., Disp: , Rfl:  .  Ginkgo Biloba 40 MG TABS, Take by mouth., Disp: , Rfl:   Current Facility-Administered Medications:  .  0.9 %  sodium chloride infusion, 500 mL, Intravenous, Once, [name], MD   No Known Allergies   Review of Systems  Constitutional: Positive for fatigue (when she walks short distances she has no energy). Negative for malaise/fatigue.  Respiratory: Negative.  Negative for cough.   Cardiovascular: Negative.  Negative for chest pain, palpitations and leg swelling.  Gastrointestinal: Positive for constipation. Negative for abdominal pain and nausea.  Endocrine: Negative for polydipsia, polyphagia and polyuria.  Musculoskeletal: Negative.   Skin: Negative.   Neurological: Negative.  Negative for dizziness and headaches.  Psychiatric/Behavioral: Negative.      Today's Vitals   02/25/19 0955  BP: 122/78  Pulse: 95  Temp: 98.3 F (36.8 C)  TempSrc: Oral  Weight: 183 lb 3.2 oz (83.1 kg)  Height: 5' 3.4" (1.61 m)  PainSc: 0-No pain   Body mass index is 32.04 kg/m.   Objective:  Physical Exam Vitals reviewed.  Constitutional:      General: She is not in acute distress.    Appearance: Normal appearance. She is obese.  HENT:     Head:     Comments: Wearing hearing aids bilaterally    Nose: Rhinorrhea present.  Cardiovascular:     Rate and Rhythm: Normal rate and regular rhythm.     Pulses: Normal pulses.     Heart sounds: Normal heart sounds. No murmur.  Pulmonary:     Effort: Pulmonary effort is normal. No respiratory distress.     Breath sounds: Normal breath sounds. No wheezing.  Skin:    General: Skin is warm and dry.     Capillary Refill: Capillary refill takes less than 2 seconds.  Neurological:     General: No focal deficit present.     Mental Status: She is alert and oriented to person, place, and time.   Psychiatric:        Mood and Affect: Mood normal.        Behavior: Behavior normal.        Thought Content: Thought content normal.        Judgment: Judgment normal.         Assessment And Plan:     1. Essential hypertension  Chronic, blood pressure is better controlled - BMP8+eGFR  2. Abnormal glucose  Chronic, stable  Will check HgbA1c  No current medications - Hemoglobin A1c  3. Mixed hyperlipidemia  Chronic, controlled  Continue with current medications  4. Dyspnea on exertion  No abnormal findings on physical exam  Will send a Rx for symbicort 2 puffs once daily to see if this helps with her dyspnea as she has a history of environmental allergies   5. Constipation, unspecified constipation type  Mild tenderness to low abdomen, will check KUB to evaluate for constipation since she is not having regular bowel movements.   Increase fluid intake and can take a stool softner - DG Abd 1 View; Future  6. Chronic rhinitis  Will try azelastine nasal spray and she is to stop the flonase.  - Azelastine HCl 137 MCG/SPRAY SOLN; Place 2 sprays into the nose daily.  Dispense: 30 mL; Refill: 2     [name], FNP    THE PATIENT IS ENCOURAGED TO PRACTICE SOCIAL DISTANCING DUE TO THE COVID-19 PANDEMIC.

## 2019-02-25 NOTE — Patient Instructions (Signed)
   Go to [facility] for an abdomen xray  Take the new nasal spray azestaline 2 sprays to each nostril  Use the symbicort (new inhaler) 2 puffs twice a day.

## 2019-02-26 ENCOUNTER — Encounter: Payer: Self-pay | Admitting: Nurse Practitioner

## 2019-02-26 LAB — LIPID PANEL
Chol/HDL Ratio: 4.8 ratio — ABNORMAL HIGH (ref 0.0–4.4)
Cholesterol, Total: 234 mg/dL — ABNORMAL HIGH (ref 100–199)
HDL: 49 mg/dL (ref >39)
LDL Chol Calc (NIH): 131 mg/dL — ABNORMAL HIGH (ref 0–99)
Triglycerides: 303 mg/dL — ABNORMAL HIGH (ref 0–149)
VLDL Cholesterol Cal: 54 mg/dL — ABNORMAL HIGH (ref 5–40)

## 2019-02-26 LAB — BMP8+EGFR
BUN/Creatinine Ratio: 9 — ABNORMAL LOW (ref 12–28)
BUN: 10 mg/dL (ref 8–27)
CO2: 22 mmol/L (ref 20–29)
Calcium: 9.9 mg/dL (ref 8.7–10.3)
Chloride: 101 mmol/L (ref 96–106)
Creatinine, Ser: 1.07 mg/dL — ABNORMAL HIGH (ref 0.57–1.00)
GFR calc Af Amer: 57 mL/min/{1.73_m2} — ABNORMAL LOW (ref >59)
GFR calc non Af Amer: 50 mL/min/{1.73_m2} — ABNORMAL LOW (ref >59)
Glucose: 101 mg/dL — ABNORMAL HIGH (ref 65–99)
Potassium: 4.4 mmol/L (ref 3.5–5.2)
Sodium: 141 mmol/L (ref 134–144)

## 2019-02-26 LAB — HEMOGLOBIN A1C
Est. average glucose Bld gHb Est-mCnc: 148 mg/dL
Hgb A1c MFr Bld: 6.8 % — ABNORMAL HIGH (ref 4.8–5.6)

## 2019-03-04 ENCOUNTER — Other Ambulatory Visit: Payer: Self-pay | Admitting: Nurse Practitioner

## 2019-03-04 DIAGNOSIS — I10 Essential (primary) hypertension: Secondary | ICD-10-CM

## 2019-03-09 ENCOUNTER — Encounter: Payer: Self-pay | Admitting: Nurse Practitioner

## 2019-03-11 NOTE — Telephone Encounter (Signed)
See if she wants to come and pick up linzess the xray shows she is constipated. She needs to drink water and continue with miralax.  I gave you a daily inhaler symbicort.

## 2019-03-25 ENCOUNTER — Encounter: Payer: Self-pay | Admitting: Nurse Practitioner

## 2019-04-05 ENCOUNTER — Other Ambulatory Visit: Payer: Self-pay | Admitting: Nurse Practitioner

## 2019-04-21 ENCOUNTER — Other Ambulatory Visit: Payer: Self-pay | Admitting: Nurse Practitioner

## 2019-05-17 ENCOUNTER — Other Ambulatory Visit: Payer: Self-pay | Admitting: Nurse Practitioner

## 2019-06-30 ENCOUNTER — Ambulatory Visit (INDEPENDENT_AMBULATORY_CARE_PROVIDER_SITE_OTHER): Payer: Medicare Other

## 2019-06-30 ENCOUNTER — Other Ambulatory Visit: Payer: Self-pay

## 2019-06-30 ENCOUNTER — Encounter: Payer: Self-pay | Admitting: Nurse Practitioner

## 2019-06-30 ENCOUNTER — Ambulatory Visit (INDEPENDENT_AMBULATORY_CARE_PROVIDER_SITE_OTHER): Payer: Medicare Other | Admitting: Nurse Practitioner

## 2019-06-30 VITALS — BP 132/70 | HR 62 | Temp 98.1°F | Ht 63.4 in | Wt 181.6 lb

## 2019-06-30 VITALS — BP 132/70 | HR 62 | Temp 98.1°F | Ht 63.4 in | Wt 181.7 lb

## 2019-06-30 DIAGNOSIS — Z0000 Encounter for general adult medical examination without abnormal findings: Secondary | ICD-10-CM

## 2019-06-30 DIAGNOSIS — E782 Mixed hyperlipidemia: Secondary | ICD-10-CM

## 2019-06-30 DIAGNOSIS — E1169 Type 2 diabetes mellitus with other specified complication: Secondary | ICD-10-CM | POA: Diagnosis not present

## 2019-06-30 DIAGNOSIS — I10 Essential (primary) hypertension: Secondary | ICD-10-CM

## 2019-06-30 DIAGNOSIS — I739 Peripheral vascular disease, unspecified: Secondary | ICD-10-CM | POA: Diagnosis not present

## 2019-06-30 DIAGNOSIS — L299 Pruritus, unspecified: Secondary | ICD-10-CM

## 2019-06-30 DIAGNOSIS — R82998 Other abnormal findings in urine: Secondary | ICD-10-CM

## 2019-06-30 DIAGNOSIS — G464 Cerebellar stroke syndrome: Secondary | ICD-10-CM | POA: Diagnosis not present

## 2019-06-30 DIAGNOSIS — R202 Paresthesia of skin: Secondary | ICD-10-CM

## 2019-06-30 DIAGNOSIS — J449 Chronic obstructive pulmonary disease, unspecified: Secondary | ICD-10-CM | POA: Diagnosis not present

## 2019-06-30 LAB — POCT URINALYSIS DIPSTICK
Bilirubin, UA: NEGATIVE
Blood, UA: NEGATIVE
Glucose, UA: NEGATIVE
Ketones, UA: NEGATIVE
Nitrite, UA: NEGATIVE
Protein, UA: NEGATIVE
Spec Grav, UA: 1.02
Urobilinogen, UA: 0.2 U/dL
pH, UA: 7

## 2019-06-30 LAB — POCT UA - MICROALBUMIN
Albumin/Creatinine Ratio, Urine, POC: <30
Creatinine, POC: 200 mg/dL
Microalbumin Ur, POC: 10 mg/L

## 2019-06-30 MED ORDER — GABAPENTIN 100 MG PO CAPS: 100.0000 mg | ORAL_CAPSULE | Freq: Three times a day (TID) | ORAL | 1 refills | Status: DC | PRN

## 2019-06-30 NOTE — Addendum Note (Signed)
Addended by: Glenna Durand E on: 06/30/2019 11:49 AM   Modules accepted: Orders

## 2019-06-30 NOTE — Patient Instructions (Signed)
Donna Gilmore , Thank you for taking time to come for your Medicare Wellness Visit. I appreciate your ongoing commitment to your health goals. Please review the following plan we discussed and let me know if I can assist you in the future.   Screening recommendations/referrals: Colonoscopy: 07/2017 Mammogram: 06/2018 Bone Density: 01/2015 Recommended yearly ophthalmology/optometry visit for glaucoma screening and checkup Recommended yearly dental visit for hygiene and checkup  Vaccinations: Influenza vaccine: 09/2018 Pneumococcal vaccine: 06/2018 Tdap vaccine: held due to covid vaccine Shingles vaccine: discussed    Advanced directives: Please bring a copy of your POA (Power of Attorney) and/or Living Will to your next appointment.   Conditions/risks identified: obesity  Next appointment: 07/05/2020 at 9:00   Preventive Care 17 Years and Older, Female Preventive care refers to lifestyle choices and visits with your health care provider that can promote health and wellness. What does preventive care include?  A yearly physical exam. This is also called an annual well check.  Dental exams once or twice a year.  Routine eye exams. Ask your health care provider how often you should have your eyes checked.  Personal lifestyle choices, including:  Daily care of your teeth and gums.  Regular physical activity.  Eating a healthy diet.  Avoiding tobacco and drug use.  Limiting alcohol use.  Practicing safe sex.  Taking low-dose aspirin every day.  Taking vitamin and mineral supplements as recommended by your health care provider. What happens during an annual well check? The services and screenings done by your health care provider during your annual well check will depend on your age, overall health, lifestyle risk factors, and family history of disease. Counseling  Your health care provider may ask you questions about your:  Alcohol use.  Tobacco use.  Drug  use.  Emotional well-being.  Home and relationship well-being.  Sexual activity.  Eating habits.  History of falls.  Memory and ability to understand (cognition).  Work and work Statistician.  Reproductive health. Screening  You may have the following tests or measurements:  Height, weight, and BMI.  Blood pressure.  Lipid and cholesterol levels. These may be checked every 5 years, or more frequently if you are over 15 years old.  Skin check.  Lung cancer screening. You may have this screening every year starting at age 78 if you have a 30-pack-year history of smoking and currently smoke or have quit within the past 15 years.  Fecal occult blood test (FOBT) of the stool. You may have this test every year starting at age 7.  Flexible sigmoidoscopy or colonoscopy. You may have a sigmoidoscopy every 5 years or a colonoscopy every 10 years starting at age 70.  Hepatitis C blood test.  Hepatitis B blood test.  Sexually transmitted disease (STD) testing.  Diabetes screening. This is done by checking your blood sugar (glucose) after you have not eaten for a while (fasting). You may have this done every 1-3 years.  Bone density scan. This is done to screen for osteoporosis. You may have this done starting at age 26.  Mammogram. This may be done every 1-2 years. Talk to your health care provider about how often you should have regular mammograms. Talk with your health care provider about your test results, treatment options, and if necessary, the need for more tests. Vaccines  Your health care provider may recommend certain vaccines, such as:  Influenza vaccine. This is recommended every year.  Tetanus, diphtheria, and acellular pertussis (Tdap, Td) vaccine. You may need  a Td booster every 10 years.  Zoster vaccine. You may need this after age 76.  Pneumococcal 13-valent conjugate (PCV13) vaccine. One dose is recommended after age 61.  Pneumococcal polysaccharide  (PPSV23) vaccine. One dose is recommended after age 55. Talk to your health care provider about which screenings and vaccines you need and how often you need them. This information is not intended to replace advice given to you by your health care provider. Make sure you discuss any questions you have with your health care provider. Document Released: 02/10/2015 Document Revised: 10/04/2015 Document Reviewed: 11/15/2014 Elsevier Interactive Patient Education  2017 [place] Prevention in the Home Falls can cause injuries. They can happen to people of all ages. There are many things you can do to make your home safe and to help prevent falls. What can I do on the outside of my home?  Regularly fix the edges of walkways and driveways and fix any cracks.  Remove anything that might make you trip as you walk through a door, such as a raised step or threshold.  Trim any bushes or trees on the path to your home.  Use bright outdoor lighting.  Clear any walking paths of anything that might make someone trip, such as rocks or tools.  Regularly check to see if handrails are loose or broken. Make sure that both sides of any steps have handrails.  Any raised decks and porches should have guardrails on the edges.  Have any leaves, snow, or ice cleared regularly.  Use sand or salt on walking paths during winter.  Clean up any spills in your garage right away. This includes oil or grease spills. What can I do in the bathroom?  Use night lights.  Install grab bars by the toilet and in the tub and shower. Do not use towel bars as grab bars.  Use non-skid mats or decals in the tub or shower.  If you need to sit down in the shower, use a plastic, non-slip stool.  Keep the floor dry. Clean up any water that spills on the floor as soon as it happens.  Remove soap buildup in the tub or shower regularly.  Attach bath mats securely with double-sided non-slip rug tape.  Do not have  throw rugs and other things on the floor that can make you trip. What can I do in the bedroom?  Use night lights.  Make sure that you have a light by your bed that is easy to reach.  Do not use any sheets or blankets that are too big for your bed. They should not hang down onto the floor.  Have a firm chair that has side arms. You can use this for support while you get dressed.  Do not have throw rugs and other things on the floor that can make you trip. What can I do in the kitchen?  Clean up any spills right away.  Avoid walking on wet floors.  Keep items that you use a lot in easy-to-reach places.  If you need to reach something above you, use a strong step stool that has a grab bar.  Keep electrical cords out of the way.  Do not use floor polish or wax that makes floors slippery. If you must use wax, use non-skid floor wax.  Do not have throw rugs and other things on the floor that can make you trip. What can I do with my stairs?  Do not leave any items on the  stairs.  Make sure that there are handrails on both sides of the stairs and use them. Fix handrails that are broken or loose. Make sure that handrails are as long as the stairways.  Check any carpeting to make sure that it is firmly attached to the stairs. Fix any carpet that is loose or worn.  Avoid having throw rugs at the top or bottom of the stairs. If you do have throw rugs, attach them to the floor with carpet tape.  Make sure that you have a light switch at the top of the stairs and the bottom of the stairs. If you do not have them, ask someone to add them for you. What else can I do to help prevent falls?  Wear shoes that:  Do not have high heels.  Have rubber bottoms.  Are comfortable and fit you well.  Are closed at the toe. Do not wear sandals.  If you use a stepladder:  Make sure that it is fully opened. Do not climb a closed stepladder.  Make sure that both sides of the stepladder are  locked into place.  Ask someone to hold it for you, if possible.  Clearly mark and make sure that you can see:  Any grab bars or handrails.  First and last steps.  Where the edge of each step is.  Use tools that help you move around (mobility aids) if they are needed. These include:  Canes.  Walkers.  Scooters.  Crutches.  Turn on the lights when you go into a dark area. Replace any light bulbs as soon as they burn out.  Set up your furniture so you have a clear path. Avoid moving your furniture around.  If any of your floors are uneven, fix them.  If there are any pets around you, be aware of where they are.  Review your medicines with your doctor. Some medicines can make you feel dizzy. This can increase your chance of falling. Ask your doctor what other things that you can do to help prevent falls. This information is not intended to replace advice given to you by your health care provider. Make sure you discuss any questions you have with your health care provider. Document Released: 11/10/2008 Document Revised: 06/22/2015 Document Reviewed: 02/18/2014 Elsevier Interactive Patient Education  2017 [employer].

## 2019-06-30 NOTE — Progress Notes (Signed)
This visit occurred during the SARS-CoV-2 public health emergency.  Safety protocols were in place, including screening questions prior to the visit, additional usage of staff PPE, and extensive cleaning of exam room while observing appropriate contact time as indicated for disinfecting solutions.  Subjective:   Donna Gilmore is a 79 y.o. female who presents for Medicare Annual (Subsequent) preventive examination.  Review of Systems:  n/a Cardiac Risk Factors include: advanced age (>81men, >60 women);diabetes mellitus;dyslipidemia;hypertension     Objective:     Vitals: BP 132/70 (BP Location: Left Arm, Patient Position: Sitting, Cuff Size: Normal)   Pulse 62   Temp 98.1 F (36.7 C) (Oral)   Ht 5' 3.4" (1.61 m)   Wt 181 lb 9.6 oz (82.4 kg)   BMI 31.76 kg/m   Body mass index is 31.76 kg/m.  Advanced Directives 06/30/2019 06/24/2018 10/22/2017 03/30/2014 11/02/2013 09/21/2012 08/25/2012  Does Patient Have a Medical Advance Directive? Yes Yes No No Yes Patient does not have advance directive Patient has advance directive, copy not in chart  Type of Advance Directive [place];Living will Living will;Healthcare Power of [place];Living will - [place]  Does patient want to make changes to medical advance directive? - - - - No - Patient declined - -  Copy of [place] in Chart? No - copy requested No - copy requested - - No - copy requested - Copy requested from family  Pre-existing out of facility DNR order (yellow form or pink MOST form) - - - - - No -    Tobacco Social History   Tobacco Use  Smoking Status Former Smoker  . Packs/day: 0.30  . Years: 30.00  . Pack years: 9.00  . Types: Cigarettes  . Quit date: 2019  . Years since quitting: 2.4  Smokeless Tobacco Never Used     Counseling given: Not Answered   Clinical Intake:  Pre-visit preparation completed: Yes  Pain : No/denies  pain     Nutritional Status: BMI > 30  Obese Nutritional Risks: None Diabetes: Yes  How often do you need to have someone help you when you read instructions, pamphlets, or other written materials from your doctor or pharmacy?: 1 - Never What is the last grade level you completed in school?: 12th grade  Interpreter Needed?: No  Information entered by :: NAllen LPN  Past Medical History:  Diagnosis Date  . Benign paroxysmal positional vertigo 08/26/2012  . CVA (cerebral infarction) 08/25/2012  . Dyslipidemia 08/25/2012  . Essential hypertension, benign 08/25/2012  . Hyperlipidemia   . Hypertension   . Meralgia paraesthetica, left 01/12/2018  . Stroke [facility])    History reviewed. No pertinent surgical history. Family History  Problem Relation Age of Onset  . Kidney failure Mother   . Diabetes Mother   . Alzheimer's disease Mother   . Alzheimer's disease Father   . Prostate cancer Father   . Diabetes Maternal Grandmother   . Diabetes Maternal Grandfather   . Alzheimer's disease Paternal Grandmother   . Alzheimer's disease Paternal Grandfather   . Prostate cancer Paternal Uncle    Social History   Socioeconomic History  . Marital status: Divorced    Spouse name: Not on file  . Number of children: 4  . Years of education: Not on file  . Highest education level: Not on file  Occupational History  . Occupation: retired  Tobacco Use  . Smoking status: Former Smoker  Packs/day: 0.30    Years: 30.00    Pack years: 9.00    Types: Cigarettes    Quit date: 2019    Years since quitting: 2.4  . Smokeless tobacco: Never Used  Substance and Sexual Activity  . Alcohol use: Yes    Comment: occasional  . Drug use: No  . Sexual activity: Not Currently  Other Topics Concern  . Not on file  Social History Narrative  . Not on file   Social Determinants of Health   Financial Resource Strain: Low Risk   . Difficulty of Paying Living Expenses: Not hard at all  Food  Insecurity: No Food Insecurity  . Worried About Charity fundraiser in the Last Year: Never true  . Ran Out of Food in the Last Year: Never true  Transportation Needs: No Transportation Needs  . Lack of Transportation (Medical): No  . Lack of Transportation (Non-Medical): No  Physical Activity: Insufficiently Active  . Days of Exercise per Week: 3 days  . Minutes of Exercise per Session: 30 min  Stress: No Stress Concern Present  . Feeling of Stress : Not at all  Social Connections:   . Frequency of Communication with Friends and Family:   . Frequency of Social Gatherings with Friends and Family:   . Attends Religious Services:   . Active Member of Clubs or Organizations:   . Attends Archivist Meetings:   Marland Kitchen Marital Status:     Outpatient Encounter Medications as of 06/30/2019  Medication Sig  . acetaminophen (TYLENOL) 500 MG tablet Take 1 tablet (500 mg total) by mouth every 6 (six) hours as needed.  Marland Kitchen albuterol (PROAIR HFA) 108 (90 Base) MCG/ACT inhaler Inhale 2 puffs into the lungs every 6 (six) hours as needed for wheezing or shortness of breath.  Marland Kitchen atorvastatin (LIPITOR) 80 MG tablet TAKE 1 TABLET BY MOUTH  DAILY  . Azelastine HCl 137 MCG/SPRAY SOLN Place 2 sprays into the nose daily.  . budesonide-formoterol (SYMBICORT) 80-4.5 MCG/ACT inhaler Inhale 2 puffs into the lungs daily.  . clopidogrel (PLAVIX) 75 MG tablet TAKE 1 TABLET BY MOUTH  DAILY  . diazepam (VALIUM) 5 MG tablet Take 5 mg by mouth as needed for anxiety.  . diclofenac Sodium (VOLTAREN) 1 % GEL Apply topically 4 (four) times daily.  Marland Kitchen donepezil (ARICEPT) 5 MG tablet TAKE 1 TABLET BY MOUTH  DAILY  . gabapentin (NEURONTIN) 100 MG capsule Take 100 mg by mouth 3 (three) times daily as needed.  Marland Kitchen levocetirizine (XYZAL) 5 MG tablet TAKE 1 TABLET BY MOUTH  DAILY IN THE EVENING  . olmesartan-hydrochlorothiazide (BENICAR HCT) 40-12.5 MG tablet TAKE 1 TABLET BY MOUTH  DAILY  . ONETOUCH ULTRA test strip USE WITH  METER TO CHECK  BLOOD SUGAR BEFORE  BREAKFAST AND BEFORE DINNER  . OVER THE COUNTER MEDICATION   . Probiotic Product (PROBIOTIC-10 PO) Take by mouth. Take one tablet daily  . vitamin C (ASCORBIC ACID) 500 MG tablet Take 500 mg by mouth daily.  Marland Kitchen VITAMIN D PO Take 1 tablet by mouth daily.  . Ginkgo Biloba 40 MG TABS Take by mouth.  . meclizine (ANTIVERT) 25 MG tablet TAKE 1 TABLET BY MOUTH 2  TIMES EVERY DAY AS NEEDED (Patient not taking: Reported on 06/30/2019)   Facility-Administered Encounter Medications as of 06/30/2019  Medication  . 0.9 %  sodium chloride infusion    Activities of Daily Living In your present state of health, do you have any difficulty performing  the following activities: 06/30/2019  Hearing? Y  Comment wears hearing aids  Vision? N  Difficulty concentrating or making decisions? Y  Walking or climbing stairs? Y  Dressing or bathing? N  Doing errands, shopping? N  Preparing Food and eating ? N  Using the Toilet? N  In the past six months, have you accidently leaked urine? N  Do you have problems with loss of bowel control? N  Managing your Medications? N  Managing your Finances? N  Housekeeping or managing your Housekeeping? N  Some recent data might be hidden    Patient Care Team: [name], [place] as PCP - General (General Practice) [name], FNP (General Practice)    Assessment:   This is a routine wellness examination for [place].  Exercise Activities and Dietary recommendations Current Exercise Habits: Home exercise routine, Type of exercise: calisthenics, Time (Minutes): 30, Frequency (Times/Week): 3, Weekly Exercise (Minutes/Week): 90  Goals    . Patient Stated     To lose weight    . Patient Stated     06/30/2019, wants to lose stomach by decreasing sweet intake       Fall Risk Fall Risk  06/30/2019 02/25/2019 10/26/2018 09/15/2018 06/24/2018  Falls in the past year? 0 0 0 0 1  Number falls in past yr: - - - - 0  Comment - - - - slipped in  the kitchen  Injury with Fall? - - - - 0  Risk for fall due to : Medication side effect - - - History of fall(s);Medication side effect  Follow up Falls evaluation completed;Education provided;Falls prevention discussed - - - Falls prevention discussed   Is the patient's home free of loose throw rugs in walkways, pet beds, electrical cords, etc?   yes      Grab bars in the bathroom? yes      Handrails on the stairs?   n/a      Adequate lighting?   yes  Timed Get Up and Go performed: n/a  Depression Screen PHQ 2/9 Scores 06/30/2019 02/25/2019 10/26/2018 09/15/2018  PHQ - 2 Score 0 0 0 0  PHQ- 9 Score - - - -     Cognitive Function     6CIT Screen 06/30/2019 06/24/2018  What Year? 0 points 0 points  What month? 0 points 0 points  What time? 0 points 0 points  Count back from 20 0 points 0 points  Months in reverse 0 points 0 points  Repeat phrase 0 points 0 points  Total Score 0 0    Immunization History  Administered Date(s) Administered  . Influenza, High Dose Seasonal PF 11/05/2017  . Moderna SARS-COVID-2 Vaccination 02/23/2019, 03/23/2019  . Pneumococcal Conjugate-13 07/07/2018    Qualifies for Shingles Vaccine? yes  Screening Tests Health Maintenance  Topic Date Due  . OPHTHALMOLOGY EXAM  12/17/2018  . FOOT EXAM  06/23/2019  . TETANUS/TDAP  06/29/2020 (Originally 04/12/1959)  . PNA vac Low Risk Adult (2 of 2 - PPSV23) 07/07/2019  . HEMOGLOBIN A1C  08/25/2019  . INFLUENZA VACCINE  08/29/2019  . DEXA SCAN  Completed  . COVID-19 Vaccine  Completed    Cancer Screenings: Lung: Low Dose CT Chest recommended if Age 11-80 years, 30 pack-year currently smoking OR have quit w/in 15years. Patient does not qualify. Breast:  Up to date on Mammogram? Yes   Up to date of Bone Density/Dexa? Yes Colorectal: not required  Additional Screenings: : Hepatitis C Screening: n/a     Plan:  Patient wants to lose stomach by decreasing sweet intake.  I have personally reviewed and  noted the following in the patient's chart:   . Medical and social history . Use of alcohol, tobacco or illicit drugs  . Current medications and supplements . Functional ability and status . Nutritional status . Physical activity . Advanced directives . List of other physicians . Hospitalizations, surgeries, and ER visits in previous 12 months . Vitals . Screenings to include cognitive, depression, and falls . Referrals and appointments  In addition, I have reviewed and discussed with patient certain preventive protocols, quality metrics, and best practice recommendations. A written personalized care plan for preventive services as well as general preventive health recommendations were provided to patient.     [name], LPN  [id number]

## 2019-06-30 NOTE — Patient Instructions (Addendum)
Health Maintenance After Age 79 After age 79, you are at a higher risk for certain long-term diseases and infections as well as injuries from falls. Falls are a major cause of broken bones and head injuries in people who are older than age 79. Getting regular preventive care can help to keep you healthy and well. Preventive care includes getting regular testing and making lifestyle changes as recommended by your health care provider. Talk with your health care provider about:  Which screenings and tests you should have. A screening is a test that checks for a disease when you have no symptoms.  A diet and exercise plan that is right for you. What should I know about screenings and tests to prevent falls? Screening and testing are the best ways to find a health problem early. Early diagnosis and treatment give you the best chance of managing medical conditions that are common after age 79. Certain conditions and lifestyle choices may make you more likely to have a fall. Your health care provider may recommend:  Regular vision checks. Poor vision and conditions such as cataracts can make you more likely to have a fall. If you wear glasses, make sure to get your prescription updated if your vision changes.  Medicine review. Work with your health care provider to regularly review all of the medicines you are taking, including over-the-counter medicines. Ask your health care provider about any side effects that may make you more likely to have a fall. Tell your health care provider if any medicines that you take make you feel dizzy or sleepy.  Osteoporosis screening. Osteoporosis is a condition that causes the bones to get weaker. This can make the bones weak and cause them to break more easily.  Blood pressure screening. Blood pressure changes and medicines to control blood pressure can make you feel dizzy.  Strength and balance checks. Your health care provider may recommend certain tests to check your  strength and balance while standing, walking, or changing positions.  Foot health exam. Foot pain and numbness, as well as not wearing proper footwear, can make you more likely to have a fall.  Depression screening. You may be more likely to have a fall if you have a fear of falling, feel emotionally low, or feel unable to do activities that you used to do.  Alcohol use screening. Using too much alcohol can affect your balance and may make you more likely to have a fall. What actions can I take to lower my risk of falls? General instructions  Talk with your health care provider about your risks for falling. Tell your health care provider if: ? You fall. Be sure to tell your health care provider about all falls, even ones that seem minor. ? You feel dizzy, sleepy, or off-balance.  Take over-the-counter and prescription medicines only as told by your health care provider. These include any supplements.  Eat a healthy diet and maintain a healthy weight. A healthy diet includes low-fat dairy products, low-fat (lean) meats, and fiber from whole grains, beans, and lots of fruits and vegetables. Home safety  Remove any tripping hazards, such as rugs, cords, and clutter.  Install safety equipment such as grab bars in bathrooms and safety rails on stairs.  Keep rooms and walkways well-lit. Activity   Follow a regular exercise program to stay fit. This will help you maintain your balance. Ask your health care provider what types of exercise are appropriate for you.  If you need a cane or   walker, use it as recommended by your health care provider.  Wear supportive shoes that have nonskid soles. Lifestyle  Do not drink alcohol if your health care provider tells you not to drink.  If you drink alcohol, limit how much you have: ? 0-1 drink a day for women. ? 0-2 drinks a day for men.  Be aware of how much alcohol is in your drink. In the [place] one drink equals one typical bottle of beer (12  oz), one-half glass of wine (5 oz), or one shot of hard liquor (1 oz).  Do not use any products that contain nicotine or tobacco, such as cigarettes and e-cigarettes. If you need help quitting, ask your health care provider. Summary  Having a healthy lifestyle and getting preventive care can help to protect your health and wellness after age 61.  Screening and testing are the best way to find a health problem early and help you avoid having a fall. Early diagnosis and treatment give you the best chance for managing medical conditions that are more common for people who are older than age 73.  Falls are a major cause of broken bones and head injuries in people who are older than age 21. Take precautions to prevent a fall at home.  Work with your health care provider to learn what changes you can make to improve your health and wellness and to prevent falls. This information is not intended to replace advice given to you by your health care provider. Make sure you discuss any questions you have with your health care provider. Document Revised: 05/07/2018 Document Reviewed: 11/27/2016 Elsevier Patient Education  [place] your sugar intake and increase your physical activity.

## 2019-06-30 NOTE — Progress Notes (Signed)
This visit occurred during the SARS-CoV-2 public health emergency.  Safety protocols were in place, including screening questions prior to the visit, additional usage of staff PPE, and extensive cleaning of exam room while observing appropriate contact time as indicated for disinfecting solutions.  Subjective:     Patient ID: Donna Gilmore , female    DOB: 07/04/1940 , 79 y.o.   MRN: [id number]   Chief Complaint  Patient presents with   Annual Exam    HPI  Here for HM   Wt Readings from Last 3 Encounters: 06/30/19 : 181 lb 10.5 oz (82.4 kg) 06/30/19 : 181 lb 9.6 oz (82.4 kg) 02/25/19 : 183 lb 3.2 oz (83.1 kg)   Hypertension This is a chronic problem. The current episode started more than 1 year ago. The problem is unchanged. Pertinent negatives include no headaches. There are no associated agents to hypertension. Risk factors for coronary artery disease include sedentary lifestyle. Past treatments include diuretics and ACE inhibitors. There are no compliance problems.  Hypertensive end-organ damage includes angina. There is no history of chronic renal disease.  Diabetes She presents for her follow-up diabetic visit. She has type 2 diabetes mellitus. Pertinent negatives for hypoglycemia include no dizziness or headaches. There are no diabetic associated symptoms. Pertinent negatives for diabetes include no fatigue. There are no hypoglycemic complications. Risk factors for coronary artery disease include obesity and sedentary lifestyle. She is compliant with treatment all of the time. She has not had a previous visit with a dietitian. She rarely participates in exercise. (118 - 158 .  ) An ACE inhibitor/angiotensin II receptor blocker is being taken. She does not see a podiatrist.Eye exam is not current.   The patient states she uses post menopausal status for birth control. Last LMP was No LMP recorded. Patient is postmenopausal.. Mammogram last done 07/06/2018.  Negative for: breast  discharge, breast lump(s), breast pain and breast self exam.  Pertinent negatives include abnormal bleeding (hematology), anxiety, decreased libido, depression, difficulty falling sleep, dyspareunia, history of infertility, nocturia, sexual dysfunction, sleep disturbances, urinary incontinence, urinary urgency, vaginal discharge and vaginal itching. Diet regular; decreased appetite. The patient states her exercise level is minimal, she tries to do the silver sneakers in the morning but her body aches. She reports her legs give out on her.   The patient's tobacco use is:  Social History   Tobacco Use  Smoking Status Former Smoker   Packs/day: 0.30   Years: 30.00   Pack years: 9.00   Types: Cigarettes   Quit date: 2019   Years since quitting: 2.4  Smokeless Tobacco Never Used   She has been exposed to passive smoke. The patient's alcohol use is:  Social History   Substance and Sexual Activity  Alcohol Use Yes   Comment: occasional    Past Medical History:  Diagnosis Date   Benign paroxysmal positional vertigo 08/26/2012   CVA (cerebral infarction) 08/25/2012   Dyslipidemia 08/25/2012   Essential hypertension, benign 08/25/2012   Hyperlipidemia    Hypertension    Meralgia paraesthetica, left 01/12/2018   Stroke [facility])      Family History  Problem Relation Age of Onset   Kidney failure Mother    Diabetes Mother    Alzheimer's disease Mother    Alzheimer's disease Father    Prostate cancer Father    Diabetes Maternal Grandmother    Diabetes Maternal Grandfather    Alzheimer's disease Paternal Grandmother    Alzheimer's disease Paternal Grandfather    Prostate  cancer Paternal Uncle      Current Outpatient Medications:    acetaminophen (TYLENOL) 500 MG tablet, Take 1 tablet (500 mg total) by mouth every 6 (six) hours as needed., Disp: 30 tablet, Rfl: 0   albuterol (PROAIR HFA) 108 (90 Base) MCG/ACT inhaler, Inhale 2 puffs into the lungs every 6 (six)  hours as needed for wheezing or shortness of breath., Disp: 2 Inhaler, Rfl: 1   atorvastatin (LIPITOR) 80 MG tablet, TAKE 1 TABLET BY MOUTH  DAILY, Disp: 90 tablet, Rfl: 3   Azelastine HCl 137 MCG/SPRAY SOLN, Place 2 sprays into the nose daily., Disp: 30 mL, Rfl: 2   budesonide-formoterol (SYMBICORT) 80-4.5 MCG/ACT inhaler, Inhale 2 puffs into the lungs daily., Disp: 1 Inhaler, Rfl: 2   clopidogrel (PLAVIX) 75 MG tablet, TAKE 1 TABLET BY MOUTH  DAILY, Disp: 90 tablet, Rfl: 3   diazepam (VALIUM) 5 MG tablet, Take 5 mg by mouth as needed for anxiety., Disp: , Rfl:    diclofenac Sodium (VOLTAREN) 1 % GEL, Apply topically 4 (four) times daily., Disp: , Rfl:    donepezil (ARICEPT) 5 MG tablet, TAKE 1 TABLET BY MOUTH  DAILY, Disp: 90 tablet, Rfl: 3   gabapentin (NEURONTIN) 100 MG capsule, Take 100 mg by mouth 3 (three) times daily as needed., Disp: , Rfl:    Ginkgo Biloba 40 MG TABS, Take by mouth., Disp: , Rfl:    levocetirizine (XYZAL) 5 MG tablet, TAKE 1 TABLET BY MOUTH  DAILY IN THE EVENING, Disp: 90 tablet, Rfl: 1   meclizine (ANTIVERT) 25 MG tablet, TAKE 1 TABLET BY MOUTH 2  TIMES EVERY DAY AS NEEDED (Patient not taking: Reported on 06/30/2019), Disp: 90 tablet, Rfl: 0   olmesartan-hydrochlorothiazide (BENICAR HCT) 40-12.5 MG tablet, TAKE 1 TABLET BY MOUTH  DAILY, Disp: 90 tablet, Rfl: 3   ONETOUCH ULTRA test strip, USE WITH METER TO CHECK  BLOOD SUGAR BEFORE  BREAKFAST AND BEFORE DINNER, Disp: 200 strip, Rfl: 3   OVER THE COUNTER MEDICATION, , Disp: , Rfl:    Probiotic Product (PROBIOTIC-10 PO), Take by mouth. Take one tablet daily, Disp: , Rfl:    vitamin C (ASCORBIC ACID) 500 MG tablet, Take 500 mg by mouth daily., Disp: , Rfl:    VITAMIN D PO, Take 1 tablet by mouth daily., Disp: , Rfl:   Current Facility-Administered Medications:    0.9 %  sodium chloride infusion, 500 mL, Intravenous, Once, [name], MD   No Known Allergies   Review of Systems  Constitutional:  Negative.  Negative for fatigue.  HENT: Negative.   Eyes: Negative.   Respiratory: Negative.   Cardiovascular: Negative.   Gastrointestinal: Negative.   Endocrine: Negative.   Genitourinary: Negative.   Musculoskeletal: Negative.   Skin: Negative.   Allergic/Immunologic: Negative.   Neurological: Negative.  Negative for dizziness and headaches.  Hematological: Negative.   Psychiatric/Behavioral: Negative.      Today's Vitals   06/30/19 0946  BP: 132/70  Pulse: 62  Temp: 98.1 F (36.7 C)  TempSrc: Oral  Weight: 181 lb 10.5 oz (82.4 kg)  Height: 5' 3.4" (1.61 m)   Body mass index is 31.77 kg/m.   Objective:  Physical Exam Constitutional:      General: She is not in acute distress.    Appearance: Normal appearance. She is well-developed.  HENT:     Head: Normocephalic and atraumatic.     Right Ear: Hearing, tympanic membrane, ear canal and external ear normal.  Left Ear: Hearing, tympanic membrane, ear canal and external ear normal.     Nose:     Comments: Deferred - masked    Mouth/Throat:     Comments: Deferred - masked Eyes:     General: Lids are normal.     Conjunctiva/sclera: Conjunctivae normal.     Pupils: Pupils are equal, round, and reactive to light.     Funduscopic exam:    Right eye: No papilledema.        Left eye: No papilledema.  Neck:     Thyroid: No thyroid mass.     Vascular: No carotid bruit.  Cardiovascular:     Rate and Rhythm: Normal rate and regular rhythm.     Pulses: Normal pulses.     Heart sounds: Normal heart sounds. No murmur.  Pulmonary:     Effort: Pulmonary effort is normal.     Breath sounds: Normal breath sounds.  Abdominal:     General: Abdomen is flat. Bowel sounds are normal.     Palpations: Abdomen is soft.  Musculoskeletal:        General: No swelling. Normal range of motion.     Cervical back: Full passive range of motion without pain, normal range of motion and neck supple.     Right lower leg: No edema.      Left lower leg: No edema.  Skin:    General: Skin is warm and dry.     Capillary Refill: Capillary refill takes less than 2 seconds.  Neurological:     General: No focal deficit present.     Mental Status: She is alert and oriented to person, place, and time.     Cranial Nerves: No cranial nerve deficit.     Sensory: No sensory deficit.  Psychiatric:        Mood and Affect: Mood normal.        Behavior: Behavior normal.        Thought Content: Thought content normal.        Judgment: Judgment normal.         Assessment And Plan:   1. Health maintenance examination  Behavior modifications discussed and diet history reviewed.    Pt will continue to exercise regularly and modify diet with low GI, plant based foods and decrease intake of processed foods.   Recommend intake of daily multivitamin, Vitamin D, and calcium.   Recommend for preventive screenings, as well as recommend immunizations that include  TDAP  2. Cerebellar stroke syndrome  History of stroke, continue follow up with Neurology as needed  3. Essential hypertension  B/P is controlled.   CMP ordered to check renal function.   The importance of regular exercise and dietary modification was stressed to the patient.   EKG done NSR  - EKG 12-Lead  4. COPD with asthma ([place])  Chronic, stable  No abnormal findings.   5. Peripheral vascular disease (HCC)  Chronic, continue with daily aspirin therapy  6. Mixed hyperlipidemia  chroni  7. Type 2 diabetes mellitus with other specified complication, without long-term current use of insulin (HCC)  Chronic, stable  Advise to avoid eating sugary foods as she admits to eating a lot of sweets  Discussed with her this can be the cause of her weight gain by eating empty calories and not being active.  Referral made to opthalmology  8. Tingling in extremities  I have advised her she can take her gabapentin up to 3 times a day  9.  Pruritus  Will check  for food allergies  No rash present to her skin - Allergens(96) Foods      [name], FNP    THE PATIENT IS ENCOURAGED TO PRACTICE SOCIAL DISTANCING DUE TO THE COVID-19 PANDEMIC.

## 2019-07-01 ENCOUNTER — Encounter: Payer: Self-pay | Admitting: Nurse Practitioner

## 2019-07-02 ENCOUNTER — Encounter: Payer: Self-pay | Admitting: Nurse Practitioner

## 2019-07-05 ENCOUNTER — Telehealth: Payer: Self-pay | Admitting: Nurse Practitioner

## 2019-07-05 NOTE — Telephone Encounter (Signed)
Called patient on her home phone, no answer left voicemail.  Called patient on her cell phone number.  She continues to ask questions about her tingling in her feet and aching all over. I reminded her she was checked for neuropathy and is currently being treated with gabapentin.  She does admit to eating an increased amounts of sweets. She wants to know if she can crush her pills due to difficulty with swallowing.  [facility] to make [facility] aware [facility] slippers was going to restart.  She is no longer going to the pain provider she requested a referral to but does not want to go back due to the pain with injections. She is using pain cream.   She had large white cells in her urine and I had planned to send for culture I will follow up in the morning. She will also need to come in for labs since was unable to obtain at her visit. She was appreciative of the phone call.

## 2019-07-11 ENCOUNTER — Other Ambulatory Visit: Payer: Self-pay | Admitting: Nurse Practitioner

## 2019-07-21 ENCOUNTER — Other Ambulatory Visit: Payer: Self-pay

## 2019-07-21 ENCOUNTER — Other Ambulatory Visit: Payer: Medicare Other

## 2019-07-21 DIAGNOSIS — R82998 Other abnormal findings in urine: Secondary | ICD-10-CM | POA: Diagnosis not present

## 2019-07-21 DIAGNOSIS — E1169 Type 2 diabetes mellitus with other specified complication: Secondary | ICD-10-CM | POA: Diagnosis not present

## 2019-07-21 DIAGNOSIS — Z0000 Encounter for general adult medical examination without abnormal findings: Secondary | ICD-10-CM | POA: Diagnosis not present

## 2019-07-21 DIAGNOSIS — E782 Mixed hyperlipidemia: Secondary | ICD-10-CM | POA: Diagnosis not present

## 2019-07-21 DIAGNOSIS — L299 Pruritus, unspecified: Secondary | ICD-10-CM | POA: Diagnosis not present

## 2019-07-22 LAB — URINE CULTURE

## 2019-07-27 ENCOUNTER — Encounter: Payer: Self-pay | Admitting: Nurse Practitioner

## 2019-07-27 LAB — CMP14+EGFR
ALT: 15 IU/L (ref 0–32)
AST: 19 IU/L (ref 0–40)
Albumin/Globulin Ratio: 1.5 (ref 1.2–2.2)
Albumin: 4.3 g/dL (ref 3.7–4.7)
Alkaline Phosphatase: 106 IU/L (ref 48–121)
BUN/Creatinine Ratio: 11 — ABNORMAL LOW (ref 12–28)
BUN: 14 mg/dL (ref 8–27)
Bilirubin Total: 0.2 mg/dL (ref 0.0–1.2)
CO2: 21 mmol/L (ref 20–29)
Calcium: 9.7 mg/dL (ref 8.7–10.3)
Chloride: 103 mmol/L (ref 96–106)
Creatinine, Ser: 1.27 mg/dL — ABNORMAL HIGH (ref 0.57–1.00)
GFR calc Af Amer: 46 mL/min/{1.73_m2} — ABNORMAL LOW (ref >59)
GFR calc non Af Amer: 40 mL/min/{1.73_m2} — ABNORMAL LOW (ref >59)
Globulin, Total: 2.8 g/dL (ref 1.5–4.5)
Glucose: 118 mg/dL — ABNORMAL HIGH (ref 65–99)
Potassium: 4.1 mmol/L (ref 3.5–5.2)
Sodium: 140 mmol/L (ref 134–144)
Total Protein: 7.1 g/dL (ref 6.0–8.5)

## 2019-07-27 LAB — ALLERGENS(96) FOODS
Allergen Apple, IgE: <0.1 kU/L
Allergen Banana IgE: <0.1 kU/L
Allergen Barley IgE: <0.1 kU/L
Allergen Black Pepper IgE: <0.1 kU/L
Allergen Blueberry IgE: <0.1 kU/L
Allergen Broccoli: <0.1 kU/L
Allergen Cabbage IgE: <0.1 kU/L
Allergen Carrot IgE: <0.1 kU/L
Allergen Cauliflower IgE: <0.1 kU/L
Allergen Celery IgE: <0.1 kU/L
Allergen Cinnamon IgE: <0.1 kU/L
Allergen Coconut IgE: <0.1 kU/L
Allergen Corn, IgE: <0.1 kU/L
Allergen Cucumber IgE: <0.1 kU/L
Allergen Garlic IgE: <0.1 kU/L
Allergen Ginger IgE: <0.1 kU/L
Allergen Gluten IgE: <0.1 kU/L
Allergen Grape IgE: <0.1 kU/L
Allergen Grapefruit IgE: <0.1 kU/L
Allergen Green Bean IgE: <0.1 kU/L
Allergen Green Bell Pepper IgE: <0.1 kU/L
Allergen Green Pea IgE: <0.1 kU/L
Allergen Lamb IgE: <0.1 kU/L
Allergen Lettuce IgE: <0.1 kU/L
Allergen Lime IgE: <0.1 kU/L
Allergen Melon IgE: <0.1 kU/L
Allergen Oat IgE: <0.1 kU/L
Allergen Onion IgE: <0.1 kU/L
Allergen Pear IgE: <0.1 kU/L
Allergen Potato, White IgE: <0.1 kU/L
Allergen Rice IgE: <0.1 kU/L
Allergen Salmon IgE: <0.1 kU/L
Allergen Strawberry IgE: <0.1 kU/L
Allergen Sweet Potato IgE: <0.1 kU/L
Allergen Tomato, IgE: <0.1 kU/L
Allergen Turkey IgE: <0.1 kU/L
Allergen Watermelon IgE: <0.1 kU/L
Allergen, Peach f95: <0.1 kU/L
Basil: <0.1 kU/L
Beef IgE: <0.1 kU/L
C074-IgE Gelatin: <0.1 kU/L
Chicken IgE: <0.1 kU/L
Chocolate/Cacao IgE: <0.1 kU/L
Clam IgE: 0.19 kU/L — AB
Codfish IgE: <0.1 kU/L
Coffee: <0.1 kU/L
Cranberry IgE: <0.1 kU/L
Egg White IgE: <0.1 kU/L
F020-IgE Almond: <0.1 kU/L
F023-IgE Crab: 0.14 kU/L — AB
F045-IgE Yeast: <0.1 kU/L
F076-IgE Alpha Lactalbumin: <0.1 kU/L
F077-IgE Beta Lactoglobulin: <0.1 kU/L
F078-IgE Casein: <0.1 kU/L
F080-IgE Lobster: 0.14 kU/L — AB
F081-IgE Cheese, Cheddar Type: <0.1 kU/L
F089-IgE Mustard: <0.1 kU/L
F096-IgE Avocado: <0.1 kU/L
F202-IgE Cashew Nut: <0.1 kU/L
F214-IgE Spinach: <0.1 kU/L
F222-IgE Tea: <0.1 kU/L
F242-IgE Bing Cherry: <0.1 kU/L
F247-IgE Honey: <0.1 kU/L
F261-IgE Asparagus: <0.1 kU/L
F262-IgE Eggplant: <0.1 kU/L
F265-IgE Cumin: <0.1 kU/L
F278-IgE Bayleaf (Laurel): <0.1 kU/L
F279-IgE Chili Pepper: <0.1 kU/L
F283-IgE Oregano: <0.1 kU/L
F300-IgE Goat's Milk: <0.1 kU/L
F342-IgE Olive, Black: <0.1 kU/L
F343-IgE Raspberry: <0.1 kU/L
Hops: <0.1 kU/L
IgE Egg (Yolk): <0.1 kU/L
Kidney Bean IgE: <0.1 kU/L
Lemon: <0.1 kU/L
Lima Bean IgE: <0.1 kU/L
Malt: <0.1 kU/L
Mushroom IgE: <0.1 kU/L
Orange: <0.1 kU/L
Peanut IgE: <0.1 kU/L
Pineapple IgE: 0.13 kU/L — AB
Pork IgE: <0.1 kU/L
Pumpkin IgE: <0.1 kU/L
Red Beet: <0.1 kU/L
Rye IgE: <0.1 kU/L
Scallop IgE: <0.1 kU/L
Sesame Seed IgE: <0.1 kU/L
Shrimp IgE: 0.37 kU/L — AB
Soybean IgE: 0.13 kU/L — AB
Tuna: <0.1 kU/L
Vanilla: <0.1 kU/L
Walnut IgE: <0.1 kU/L
Wheat IgE: <0.1 kU/L
Whey: <0.1 kU/L
White Bean IgE: <0.1 kU/L

## 2019-07-27 LAB — CBC
Hematocrit: 34.1 % (ref 34.0–46.6)
Hemoglobin: 11.2 g/dL (ref 11.1–15.9)
MCH: 28.5 pg (ref 26.6–33.0)
MCHC: 32.8 g/dL (ref 31.5–35.7)
MCV: 87 fL (ref 79–97)
Platelets: 240 10*3/uL (ref 150–450)
RBC: 3.93 x10E6/uL (ref 3.77–5.28)
RDW: 14.4 % (ref 11.7–15.4)
WBC: 6.1 10*3/uL (ref 3.4–10.8)

## 2019-07-27 LAB — HEMOGLOBIN A1C
Est. average glucose Bld gHb Est-mCnc: 157 mg/dL
Hgb A1c MFr Bld: 7.1 % — ABNORMAL HIGH (ref 4.8–5.6)

## 2019-07-27 LAB — LIPID PANEL
Chol/HDL Ratio: 5.2 ratio — ABNORMAL HIGH (ref 0.0–4.4)
Cholesterol, Total: 230 mg/dL — ABNORMAL HIGH (ref 100–199)
HDL: 44 mg/dL (ref >39)
LDL Chol Calc (NIH): 136 mg/dL — ABNORMAL HIGH (ref 0–99)
Triglycerides: 277 mg/dL — ABNORMAL HIGH (ref 0–149)
VLDL Cholesterol Cal: 50 mg/dL — ABNORMAL HIGH (ref 5–40)

## 2019-08-16 ENCOUNTER — Other Ambulatory Visit: Payer: Self-pay

## 2019-08-16 ENCOUNTER — Encounter: Payer: Self-pay | Admitting: Nurse Practitioner

## 2019-08-16 ENCOUNTER — Ambulatory Visit (INDEPENDENT_AMBULATORY_CARE_PROVIDER_SITE_OTHER): Payer: Medicare Other | Admitting: Nurse Practitioner

## 2019-08-16 VITALS — BP 132/76 | HR 82 | Temp 98.0°F | Wt 178.2 lb

## 2019-08-16 DIAGNOSIS — K146 Glossodynia: Secondary | ICD-10-CM | POA: Diagnosis not present

## 2019-08-16 LAB — POCT RAPID STREP A (OFFICE): Rapid Strep A Screen: NEGATIVE

## 2019-08-16 MED ORDER — MAGIC MOUTHWASH W/LIDOCAINE: 5.0000 mL | Freq: Three times a day (TID) | ORAL | 0 refills | Status: DC

## 2019-08-16 MED ORDER — TRIAMCINOLONE ACETONIDE 40 MG/ML IJ SUSP
40.0000 mg | Freq: Once | INTRAMUSCULAR | Status: AC
  Administered 2019-08-16: 40 mg via INTRAMUSCULAR

## 2019-08-16 NOTE — Telephone Encounter (Signed)
error 

## 2019-08-16 NOTE — Progress Notes (Signed)
This visit occurred during the SARS-CoV-2 public health emergency.  Safety protocols were in place, including screening questions prior to the visit, additional usage of staff PPE, and extensive cleaning of exam room while observing appropriate contact time as indicated for disinfecting solutions.  Subjective:     Patient ID: Donna Gilmore , female    DOB: 06-Oct-1940 , 79 y.o.   MRN: [id number]   Chief Complaint  Patient presents with  . Oral Swelling    patient stated she woke up feeling like she had chewed on her tongue and her lips are swelling too     HPI  She is here today due to having concerns about feeling like she has been chewing on her tongue, swollen lower lip and down her throat is swollen.  Denies eating any seafood.  She has not taken any benadryl.      Past Medical History:  Diagnosis Date  . Benign paroxysmal positional vertigo 08/26/2012  . CVA (cerebral infarction) 08/25/2012  . Dyslipidemia 08/25/2012  . Essential hypertension, benign 08/25/2012  . Hyperlipidemia   . Hypertension   . Meralgia paraesthetica, left 01/12/2018  . Stroke [facility])      Family History  Problem Relation Age of Onset  . Kidney failure Mother   . Diabetes Mother   . Alzheimer's disease Mother   . Alzheimer's disease Father   . Prostate cancer Father   . Diabetes Maternal Grandmother   . Diabetes Maternal Grandfather   . Alzheimer's disease Paternal Grandmother   . Alzheimer's disease Paternal Grandfather   . Prostate cancer Paternal Uncle      Current Outpatient Medications:  .  acetaminophen (TYLENOL) 500 MG tablet, Take 1 tablet (500 mg total) by mouth every 6 (six) hours as needed., Disp: 30 tablet, Rfl: 0 .  albuterol (PROAIR HFA) 108 (90 Base) MCG/ACT inhaler, Inhale 2 puffs into the lungs every 6 (six) hours as needed for wheezing or shortness of breath., Disp: 2 Inhaler, Rfl: 1 .  atorvastatin (LIPITOR) 80 MG tablet, TAKE 1 TABLET BY MOUTH  DAILY, Disp: 90 tablet, Rfl:  3 .  Azelastine HCl 137 MCG/SPRAY SOLN, Place 2 sprays into the nose daily., Disp: 30 mL, Rfl: 2 .  budesonide-formoterol (SYMBICORT) 80-4.5 MCG/ACT inhaler, Inhale 2 puffs into the lungs daily., Disp: 1 Inhaler, Rfl: 2 .  clopidogrel (PLAVIX) 75 MG tablet, TAKE 1 TABLET BY MOUTH  DAILY, Disp: 90 tablet, Rfl: 3 .  diazepam (VALIUM) 5 MG tablet, Take 5 mg by mouth as needed for anxiety., Disp: , Rfl:  .  diclofenac Sodium (VOLTAREN) 1 % GEL, Apply topically 4 (four) times daily., Disp: , Rfl:  .  donepezil (ARICEPT) 5 MG tablet, TAKE 1 TABLET BY MOUTH  DAILY, Disp: 90 tablet, Rfl: 3 .  gabapentin (NEURONTIN) 100 MG capsule, Take 1 capsule (100 mg total) by mouth 3 (three) times daily as needed., Disp: 180 capsule, Rfl: 1 .  Ginkgo Biloba 40 MG TABS, Take by mouth., Disp: , Rfl:  .  levocetirizine (XYZAL) 5 MG tablet, TAKE 1 TABLET BY MOUTH  DAILY IN THE EVENING, Disp: 90 tablet, Rfl: 1 .  olmesartan-hydrochlorothiazide (BENICAR HCT) 40-12.5 MG tablet, TAKE 1 TABLET BY MOUTH  DAILY, Disp: 90 tablet, Rfl: 3 .  ONETOUCH ULTRA test strip, USE WITH METER TO CHECK  BLOOD SUGAR BEFORE  BREAKFAST AND BEFORE DINNER, Disp: 200 strip, Rfl: 3 .  OVER THE COUNTER MEDICATION, , Disp: , Rfl:  .  Probiotic Product (PROBIOTIC-10 PO), Take  by mouth. Take one tablet daily, Disp: , Rfl:  .  vitamin C (ASCORBIC ACID) 500 MG tablet, Take 500 mg by mouth daily., Disp: , Rfl:  .  VITAMIN D PO, Take 1 tablet by mouth daily., Disp: , Rfl:  .  meclizine (ANTIVERT) 25 MG tablet, TAKE 1 TABLET BY MOUTH 2  TIMES EVERY DAY AS NEEDED (Patient not taking: Reported on 06/30/2019), Disp: 90 tablet, Rfl: 0  Current Facility-Administered Medications:  .  0.9 %  sodium chloride infusion, 500 mL, Intravenous, Once, [name], MD   No Known Allergies   Review of Systems  Constitutional: Negative.  Negative for fatigue.  HENT:       Wears dentures but unable to at this time.  Respiratory: Negative.  Negative for cough and  shortness of breath (no new shortness of breath).   Cardiovascular: Negative.  Negative for chest pain, palpitations and leg swelling.  Skin:       She is having itching to her body.    Neurological: Negative for dizziness and headaches.  Psychiatric/Behavioral: Negative.      Today's Vitals   08/16/19 1500  BP: 132/76  Pulse: 82  Temp: 98 F (36.7 C)  TempSrc: Oral  Weight: 178 lb 3.2 oz (80.8 kg)  PainSc: 0-No pain   Body mass index is 31.17 kg/m.   Objective:  Physical Exam Constitutional:      General: She is not in acute distress.    Appearance: Normal appearance. She is obese.  HENT:     Mouth/Throat:     Lips: Pink. No lesions.     Mouth: Mucous membranes are moist. No injury or angioedema.     Dentition: Has dentures (not in today).     Tongue: No lesions. Tongue does not deviate from midline.     Palate: No mass and lesions.     Pharynx: Oropharynx is clear. No posterior oropharyngeal erythema.     Comments: No obvious swelling to lips or tongue Cardiovascular:     Rate and Rhythm: Normal rate and regular rhythm.     Pulses: Normal pulses.     Heart sounds: Normal heart sounds. No murmur heard.   Pulmonary:     Effort: Pulmonary effort is normal. No respiratory distress.     Breath sounds: Normal breath sounds.  Neurological:     General: No focal deficit present.     Mental Status: She is alert and oriented to person, place, and time.     Cranial Nerves: No cranial nerve deficit.  Psychiatric:        Mood and Affect: Mood normal.        Behavior: Behavior normal.        Thought Content: Thought content normal.        Judgment: Judgment normal.         Assessment And Plan:     1. Soreness of tongue  Since complaining of feeling like her throat/tongue is swelling will treat with kenalog  Will also send rx for miracle mouthwash since she uses symbicort daily  Reminded to rinse mouth out after use  Avoiding eating right after using miracle  mouthwash  Negative strep test - triamcinolone acetonide (KENALOG-40) injection 40 mg - POCT rapid strep A     Patient was given opportunity to ask questions. Patient verbalized understanding of the plan and was able to repeat key elements of the plan. All questions were answered to their satisfaction.   [name], FNP  Teola Bradley, FNP, have reviewed all documentation for this visit. The documentation on 08/16/19 for the exam, diagnosis, procedures, and orders are all accurate and complete.  THE PATIENT IS ENCOURAGED TO PRACTICE SOCIAL DISTANCING DUE TO THE COVID-19 PANDEMIC.

## 2019-08-31 ENCOUNTER — Other Ambulatory Visit: Payer: Self-pay | Admitting: Nurse Practitioner

## 2019-08-31 DIAGNOSIS — R202 Paresthesia of skin: Secondary | ICD-10-CM

## 2019-08-31 NOTE — Telephone Encounter (Signed)
Gabapentin refill

## 2019-09-01 DIAGNOSIS — H10413 Chronic giant papillary conjunctivitis, bilateral: Secondary | ICD-10-CM | POA: Diagnosis not present

## 2019-09-01 DIAGNOSIS — H35373 Puckering of macula, bilateral: Secondary | ICD-10-CM | POA: Diagnosis not present

## 2019-09-01 DIAGNOSIS — H47322 Drusen of optic disc, left eye: Secondary | ICD-10-CM | POA: Diagnosis not present

## 2019-09-01 DIAGNOSIS — E119 Type 2 diabetes mellitus without complications: Secondary | ICD-10-CM | POA: Diagnosis not present

## 2019-09-01 DIAGNOSIS — H2513 Age-related nuclear cataract, bilateral: Secondary | ICD-10-CM | POA: Diagnosis not present

## 2019-09-01 LAB — HM DIABETES EYE EXAM

## 2019-09-06 ENCOUNTER — Encounter: Payer: Self-pay | Admitting: Nurse Practitioner

## 2019-09-13 ENCOUNTER — Other Ambulatory Visit: Payer: Self-pay | Admitting: Nurse Practitioner

## 2019-09-29 ENCOUNTER — Encounter: Payer: Self-pay | Admitting: Nurse Practitioner

## 2019-10-04 ENCOUNTER — Encounter: Payer: Self-pay | Admitting: Nurse Practitioner

## 2019-10-05 ENCOUNTER — Encounter: Payer: Self-pay | Admitting: Nurse Practitioner

## 2019-10-19 ENCOUNTER — Ambulatory Visit (INDEPENDENT_AMBULATORY_CARE_PROVIDER_SITE_OTHER): Payer: Medicare Other | Admitting: Nurse Practitioner

## 2019-10-19 ENCOUNTER — Other Ambulatory Visit: Payer: Self-pay

## 2019-10-19 ENCOUNTER — Encounter: Payer: Self-pay | Admitting: Nurse Practitioner

## 2019-10-19 ENCOUNTER — Other Ambulatory Visit: Payer: Self-pay | Admitting: Nurse Practitioner

## 2019-10-19 VITALS — BP 118/72 | HR 64 | Temp 97.8°F | Ht 63.4 in | Wt 182.0 lb

## 2019-10-19 DIAGNOSIS — Z8709 Personal history of other diseases of the respiratory system: Secondary | ICD-10-CM

## 2019-10-19 DIAGNOSIS — Z23 Encounter for immunization: Secondary | ICD-10-CM | POA: Diagnosis not present

## 2019-10-19 DIAGNOSIS — I10 Essential (primary) hypertension: Secondary | ICD-10-CM

## 2019-10-19 DIAGNOSIS — L299 Pruritus, unspecified: Secondary | ICD-10-CM

## 2019-10-19 DIAGNOSIS — E1169 Type 2 diabetes mellitus with other specified complication: Secondary | ICD-10-CM | POA: Diagnosis not present

## 2019-10-19 DIAGNOSIS — G3184 Mild cognitive impairment, so stated: Secondary | ICD-10-CM

## 2019-10-19 DIAGNOSIS — Z1159 Encounter for screening for other viral diseases: Secondary | ICD-10-CM

## 2019-10-19 DIAGNOSIS — E782 Mixed hyperlipidemia: Secondary | ICD-10-CM | POA: Diagnosis not present

## 2019-10-19 MED ORDER — ALBUTEROL SULFATE HFA 108 (90 BASE) MCG/ACT IN AERS: 2.0000 | INHALATION_SPRAY | Freq: Four times a day (QID) | RESPIRATORY_TRACT | 2 refills | Status: DC | PRN

## 2019-10-19 MED ORDER — BREZTRI AEROSPHERE 160-9-4.8 MCG/ACT IN AERO: 2.0000 | INHALATION_SPRAY | Freq: Every day | RESPIRATORY_TRACT | 2 refills | Status: DC

## 2019-10-19 NOTE — Progress Notes (Signed)
I,Yamilka Roman [employer] as a Education administrator for [employer], FNP.,have documented all relevant documentation on the behalf of [name], FNP,as directed by  [name], FNP while in the presence of [name], [place]. This visit occurred during the SARS-CoV-2 public health emergency.  Safety protocols were in place, including screening questions prior to the visit, additional usage of staff PPE, and extensive cleaning of exam room while observing appropriate contact time as indicated for disinfecting solutions.  Subjective:     Patient ID: Donna Gilmore , female    DOB: January 22, 1941 , 79 y.o.   MRN: [id number]   Chief Complaint  Patient presents with  . Hypertension  . Diabetes  . Psoriasis    HPI  Here for 4 month follow up of hypertension and prediabetes   Wt Readings from Last 3 Encounters: 10/19/19 : 182 lb (82.6 kg) 08/16/19 : 178 lb 3.2 oz (80.8 kg) 06/30/19 : 181 lb 10.5 oz (82.4 kg)   Hypertension This is a chronic problem. The current episode started today. The problem has been gradually improving since onset. The problem is controlled. Pertinent negatives include no anxiety, chest pain, headaches, malaise/fatigue or palpitations. There are no associated agents to hypertension. There are no known risk factors for coronary artery disease. Past treatments include angiotensin blockers and diuretics. There are no compliance problems.  There is no history of angina. There is no history of chronic renal disease.  Diabetes She presents for her follow-up diabetic visit. Diabetes type: prediabete. There are no hypoglycemic associated symptoms. Pertinent negatives for hypoglycemia include no headaches. Pertinent negatives for diabetes include no chest pain. Symptoms are stable. There are no diabetic complications.     Past Medical History:  Diagnosis Date  . Benign paroxysmal positional vertigo 08/26/2012  . CVA (cerebral infarction) 08/25/2012  . Dyslipidemia 08/25/2012  .  Essential hypertension, benign 08/25/2012  . Hyperlipidemia   . Hypertension   . Meralgia paraesthetica, left 01/12/2018  . Stroke [facility])      Family History  Problem Relation Age of Onset  . Kidney failure Mother   . Diabetes Mother   . Alzheimer's disease Mother   . Alzheimer's disease Father   . Prostate cancer Father   . Diabetes Maternal Grandmother   . Diabetes Maternal Grandfather   . Alzheimer's disease Paternal Grandmother   . Alzheimer's disease Paternal Grandfather   . Prostate cancer Paternal Uncle      Current Outpatient Medications:  .  acetaminophen (TYLENOL) 500 MG tablet, Take 1 tablet (500 mg total) by mouth every 6 (six) hours as needed., Disp: 30 tablet, Rfl: 0 .  albuterol (PROAIR HFA) 108 (90 Base) MCG/ACT inhaler, Inhale 2 puffs into the lungs every 6 (six) hours as needed for wheezing or shortness of breath., Disp: 18 g, Rfl: 2 .  Azelastine HCl 137 MCG/SPRAY SOLN, Place 2 sprays into the nose daily., Disp: 30 mL, Rfl: 2 .  clopidogrel (PLAVIX) 75 MG tablet, TAKE 1 TABLET BY MOUTH  DAILY, Disp: 90 tablet, Rfl: 3 .  diazepam (VALIUM) 5 MG tablet, Take 5 mg by mouth as needed for anxiety., Disp: , Rfl:  .  diclofenac Sodium (VOLTAREN) 1 % GEL, Apply topically 4 (four) times daily., Disp: , Rfl:  .  gabapentin (NEURONTIN) 100 MG capsule, TAKE 1 CAPSULE BY MOUTH 3  TIMES DAILY AS NEEDED, Disp: 270 capsule, Rfl: 3 .  Ginkgo Biloba 40 MG TABS, Take by mouth., Disp: , Rfl:  .  levocetirizine (XYZAL) 5 MG tablet,  TAKE 1 TABLET BY MOUTH  DAILY IN THE EVENING, Disp: 90 tablet, Rfl: 1 .  magic mouthwash w/lidocaine SOLN, Take 5 mLs by mouth 3 (three) times daily. 1 part benadryl, 1 part nystatin and 1 part lidocaine, Disp: 120 mL, Rfl: 0 .  meclizine (ANTIVERT) 25 MG tablet, TAKE 1 TABLET BY MOUTH 2  TIMES EVERY DAY AS NEEDED, Disp: 90 tablet, Rfl: 0 .  olmesartan-hydrochlorothiazide (BENICAR HCT) 40-12.5 MG tablet, TAKE 1 TABLET BY MOUTH  DAILY, Disp: 90 tablet, Rfl:  3 .  ONETOUCH ULTRA test strip, USE WITH METER TO CHECK  BLOOD SUGAR BEFORE  BREAKFAST AND BEFORE DINNER, Disp: 200 strip, Rfl: 3 .  OVER THE COUNTER MEDICATION, , Disp: , Rfl:  .  Probiotic Product (PROBIOTIC-10 PO), Take by mouth. Take one tablet daily, Disp: , Rfl:  .  vitamin C (ASCORBIC ACID) 500 MG tablet, Take 500 mg by mouth daily., Disp: , Rfl:  .  VITAMIN D PO, Take 1 tablet by mouth daily., Disp: , Rfl:  .  atorvastatin (LIPITOR) 80 MG tablet, TAKE 1 TABLET BY MOUTH  DAILY, Disp: 90 tablet, Rfl: 3 .  Budeson-Glycopyrrol-Formoterol (BREZTRI AEROSPHERE) 160-9-4.8 MCG/ACT AERO, Inhale 2 puffs into the lungs daily., Disp: 10.7 g, Rfl: 2 .  donepezil (ARICEPT) 5 MG tablet, TAKE 1 TABLET BY MOUTH  DAILY, Disp: 90 tablet, Rfl: 3  Current Facility-Administered Medications:  .  0.9 %  sodium chloride infusion, 500 mL, Intravenous, Once, [name], MD   No Known Allergies   Review of Systems  Constitutional: Negative for malaise/fatigue.  Respiratory: Negative.   Cardiovascular: Negative for chest pain and palpitations.  Neurological: Negative for headaches.     Today's Vitals   10/19/19 1435  BP: 118/72  Pulse: 64  Temp: 97.8 F (36.6 C)  TempSrc: Oral  Weight: 182 lb (82.6 kg)  Height: 5' 3.4" (1.61 m)  PainSc: 0-No pain   Body mass index is 31.83 kg/m.   Objective:  Physical Exam Vitals reviewed.  Constitutional:      General: She is not in acute distress.    Appearance: Normal appearance. She is obese.  HENT:     Nose: No rhinorrhea.  Cardiovascular:     Rate and Rhythm: Normal rate and regular rhythm.     Pulses: Normal pulses.     Heart sounds: Normal heart sounds. No murmur heard.   Pulmonary:     Effort: Pulmonary effort is normal. No respiratory distress.     Breath sounds: Normal breath sounds. No wheezing.  Skin:    General: Skin is warm and dry.     Capillary Refill: Capillary refill takes less than 2 seconds.     Findings: Rash (scattered rash  and dry skin - no erythema) present.  Neurological:     General: No focal deficit present.     Mental Status: She is alert and oriented to person, place, and time.     Cranial Nerves: No cranial nerve deficit.  Psychiatric:        Mood and Affect: Mood normal.        Behavior: Behavior normal.        Thought Content: Thought content normal.        Judgment: Judgment normal.         Assessment And Plan:     1. Essential hypertension . B/P is well controlled.  . CMP ordered to check renal function.  . The importance of regular exercise and dietary modification  was stressed to the patient.   2. Type 2 diabetes mellitus with other specified complication, without long-term current use of insulin (HCC)  Chronic, stable  Continue with diet and exercise to control  No current medications   HgbA1c is in prediabetic range - Hemoglobin A1c - CMP14+EGFR  3. Mixed hyperlipidemia  Chronic, controlled  Continue with current medications, tolerating medications well - Lipid panel  4. Need for influenza vaccination  Influenza vaccine administered  Encouraged to take Tylenol as needed for fever or muscle aches. - Flu Vaccine QUAD High Dose(Fluad)  5. Pruritus  She is having persistent itching with scattered dry and scaly rash  She would like a referral to dermatology for further evaluation  6. Encounter for hepatitis C screening test for low risk patient  Will check Hepatitis C screening due to recent recommendations to screen all adults 18 years and older - Hepatitis C antibody  7. History of COPD  She has intermittent cough and a history of COPD, explains has issues with intermittent dyspnea. Has not had a steroid inhaler. Will try her on Breztri and she will have the proair as needed.   - albuterol (PROAIR HFA) 108 (90 Base) MCG/ACT inhaler; Inhale 2 puffs into the lungs every 6 (six) hours as needed for wheezing or shortness of breath.  Dispense: 18 g; Refill: 2 -  Budeson-Glycopyrrol-Formoterol (BREZTRI AEROSPHERE) 160-9-4.8 MCG/ACT AERO; Inhale 2 puffs into the lungs daily.  Dispense: 10.7 g; Refill: 2     Patient was given opportunity to ask questions. Patient verbalized understanding of the plan and was able to repeat key elements of the plan. All questions were answered to their satisfaction.   Teola Bradley, FNP, have reviewed all documentation for this visit. The documentation on 10/28/19 for the exam, diagnosis, procedures, and orders are all accurate and complete.  THE PATIENT IS ENCOURAGED TO PRACTICE SOCIAL DISTANCING DUE TO THE COVID-19 PANDEMIC.

## 2019-10-20 ENCOUNTER — Encounter: Payer: Self-pay | Admitting: Nurse Practitioner

## 2019-10-20 LAB — CMP14+EGFR
ALT: 30 IU/L (ref 0–32)
AST: 26 IU/L (ref 0–40)
Albumin/Globulin Ratio: 1.7 (ref 1.2–2.2)
Albumin: 4.6 g/dL (ref 3.7–4.7)
Alkaline Phosphatase: 131 IU/L — ABNORMAL HIGH (ref 44–121)
BUN/Creatinine Ratio: 9 — ABNORMAL LOW (ref 12–28)
BUN: 10 mg/dL (ref 8–27)
Bilirubin Total: 0.3 mg/dL (ref 0.0–1.2)
CO2: 23 mmol/L (ref 20–29)
Calcium: 10 mg/dL (ref 8.7–10.3)
Chloride: 102 mmol/L (ref 96–106)
Creatinine, Ser: 1.09 mg/dL — ABNORMAL HIGH (ref 0.57–1.00)
GFR calc Af Amer: 56 mL/min/{1.73_m2} — ABNORMAL LOW (ref >59)
GFR calc non Af Amer: 48 mL/min/{1.73_m2} — ABNORMAL LOW (ref >59)
Globulin, Total: 2.7 g/dL (ref 1.5–4.5)
Glucose: 82 mg/dL (ref 65–99)
Potassium: 4.2 mmol/L (ref 3.5–5.2)
Sodium: 143 mmol/L (ref 134–144)
Total Protein: 7.3 g/dL (ref 6.0–8.5)

## 2019-10-20 LAB — HEMOGLOBIN A1C
Est. average glucose Bld gHb Est-mCnc: 154 mg/dL
Hgb A1c MFr Bld: 7 % — ABNORMAL HIGH (ref 4.8–5.6)

## 2019-10-20 LAB — LIPID PANEL
Chol/HDL Ratio: 6.4 ratio — ABNORMAL HIGH (ref 0.0–4.4)
Cholesterol, Total: 328 mg/dL — ABNORMAL HIGH (ref 100–199)
HDL: 51 mg/dL (ref >39)
LDL Chol Calc (NIH): 197 mg/dL — ABNORMAL HIGH (ref 0–99)
Triglycerides: 391 mg/dL — ABNORMAL HIGH (ref 0–149)
VLDL Cholesterol Cal: 80 mg/dL — ABNORMAL HIGH (ref 5–40)

## 2019-10-20 LAB — HEPATITIS C ANTIBODY: Hep C Virus Ab: <0.1 s/co ratio (ref 0.0–0.9)

## 2019-10-23 ENCOUNTER — Other Ambulatory Visit: Payer: Self-pay | Admitting: Nurse Practitioner

## 2019-10-28 ENCOUNTER — Encounter: Payer: Self-pay | Admitting: Nurse Practitioner

## 2019-11-01 ENCOUNTER — Ambulatory Visit: Payer: Medicare Other | Admitting: Nurse Practitioner

## 2019-11-17 ENCOUNTER — Other Ambulatory Visit: Payer: Self-pay | Admitting: Nurse Practitioner

## 2019-11-17 DIAGNOSIS — L298 Other pruritus: Secondary | ICD-10-CM | POA: Diagnosis not present

## 2019-11-17 DIAGNOSIS — L821 Other seborrheic keratosis: Secondary | ICD-10-CM | POA: Diagnosis not present

## 2019-11-17 DIAGNOSIS — D2272 Melanocytic nevi of left lower limb, including hip: Secondary | ICD-10-CM | POA: Diagnosis not present

## 2019-12-20 ENCOUNTER — Other Ambulatory Visit: Payer: Self-pay | Admitting: Nurse Practitioner

## 2019-12-20 DIAGNOSIS — Z8709 Personal history of other diseases of the respiratory system: Secondary | ICD-10-CM

## 2019-12-27 ENCOUNTER — Encounter: Payer: Self-pay | Admitting: Nurse Practitioner

## 2019-12-28 ENCOUNTER — Other Ambulatory Visit: Payer: Self-pay

## 2019-12-28 DIAGNOSIS — I10 Essential (primary) hypertension: Secondary | ICD-10-CM

## 2019-12-28 MED ORDER — OLMESARTAN MEDOXOMIL-HCTZ 40-12.5 MG PO TABS: 1.0000 | ORAL_TABLET | Freq: Every day | ORAL | 3 refills | Status: DC

## 2019-12-28 MED ORDER — MECLIZINE HCL 25 MG PO TABS: ORAL_TABLET | ORAL | 0 refills | Status: DC

## 2019-12-28 MED ORDER — LEVOCETIRIZINE DIHYDROCHLORIDE 5 MG PO TABS
5.0000 mg | ORAL_TABLET | Freq: Every evening | ORAL | 1 refills | Status: DC
Start: 2019-12-28 — End: 2022-06-06

## 2020-01-08 ENCOUNTER — Encounter: Payer: Self-pay | Admitting: Nurse Practitioner

## 2020-01-13 ENCOUNTER — Ambulatory Visit: Payer: Medicare Other | Admitting: Nurse Practitioner

## 2020-01-19 ENCOUNTER — Encounter: Payer: Self-pay | Admitting: Nurse Practitioner

## 2020-01-19 ENCOUNTER — Ambulatory Visit (INDEPENDENT_AMBULATORY_CARE_PROVIDER_SITE_OTHER): Payer: Medicare Other | Admitting: Nurse Practitioner

## 2020-01-19 ENCOUNTER — Other Ambulatory Visit: Payer: Self-pay

## 2020-01-19 VITALS — BP 130/85 | HR 86 | Temp 97.9°F | Ht 63.4 in | Wt 190.4 lb

## 2020-01-19 DIAGNOSIS — E1169 Type 2 diabetes mellitus with other specified complication: Secondary | ICD-10-CM

## 2020-01-19 DIAGNOSIS — I739 Peripheral vascular disease, unspecified: Secondary | ICD-10-CM

## 2020-01-19 DIAGNOSIS — I10 Essential (primary) hypertension: Secondary | ICD-10-CM

## 2020-01-19 DIAGNOSIS — R202 Paresthesia of skin: Secondary | ICD-10-CM | POA: Diagnosis not present

## 2020-01-19 DIAGNOSIS — Z6833 Body mass index (BMI) 33.0-33.9, adult: Secondary | ICD-10-CM

## 2020-01-19 NOTE — Progress Notes (Signed)
I,Donna Gilmore [employer] as a Education administrator for [employer], FNP.,have documented all relevant documentation on the behalf of Donna Brine, FNP,as directed by  Donna Brine, FNP while in the presence of Donna Gilmore, Donna Gilmore. This visit occurred during the SARS-CoV-2 public health emergency.  Safety protocols were in place, including screening questions prior to the visit, additional usage of staff PPE, and extensive cleaning of exam room while observing appropriate contact time as indicated for disinfecting solutions.  Subjective:     Patient ID: Donna Gilmore , female    DOB: Jul 19, 1940 , 79 y.o.   MRN: [id number]   Chief Complaint  Patient presents with  . Diabetes  . Hypertension    HPI  Patient here for a blood pressure and diabetes f/u.  Wt Readings from Last 3 Encounters: 01/19/20 : 190 lb 6.4 oz (86.4 kg) 10/19/19 : 182 lb (82.6 kg) 08/16/19 : 178 lb 3.2 oz (80.8 kg)  Her daughter Donna Gilmore is present during visit  Diabetes She presents for her follow-up diabetic visit. She has type 2 diabetes mellitus. Her disease course has been stable. There are no hypoglycemic associated symptoms. Pertinent negatives for hypoglycemia include no headaches. There are no diabetic associated symptoms. Pertinent negatives for diabetes include no chest pain. There are no hypoglycemic complications. There are no diabetic complications. Risk factors for coronary artery disease include obesity and sedentary lifestyle. She has not had a previous visit with a dietitian. She rarely participates in exercise. (Blood sugar 96-140. ) She does not see a podiatrist.Eye exam is not current.  Hypertension This is a chronic problem. The current episode started more than 1 year ago. The problem is unchanged. The problem is controlled. Pertinent negatives include no anxiety, chest pain, headaches or shortness of breath. Risk factors for coronary artery disease include obesity and sedentary lifestyle. There are no  compliance problems.  There is no history of angina. There is no history of chronic renal disease.     Past Medical History:  Diagnosis Date  . Benign paroxysmal positional vertigo 08/26/2012  . CVA (cerebral infarction) 08/25/2012  . Dyslipidemia 08/25/2012  . Essential hypertension, benign 08/25/2012  . Hyperlipidemia   . Hypertension   . Meralgia paraesthetica, left 01/12/2018  . Stroke [facility])      Family History  Problem Relation Age of Onset  . Kidney failure Mother   . Diabetes Mother   . Alzheimer's disease Mother   . Alzheimer's disease Father   . Prostate cancer Father   . Diabetes Maternal Grandmother   . Diabetes Maternal Grandfather   . Alzheimer's disease Paternal Grandmother   . Alzheimer's disease Paternal Grandfather   . Prostate cancer Paternal Uncle      Current Outpatient Medications:  .  acetaminophen (TYLENOL) 500 MG tablet, Take 1 tablet (500 mg total) by mouth every 6 (six) hours as needed., Disp: 30 tablet, Rfl: 0 .  albuterol (PROAIR HFA) 108 (90 Base) MCG/ACT inhaler, Inhale 2 puffs into the lungs every 6 (six) hours as needed for wheezing or shortness of breath., Disp: 18 g, Rfl: 2 .  atorvastatin (LIPITOR) 80 MG tablet, TAKE 1 TABLET BY MOUTH  DAILY, Disp: 90 tablet, Rfl: 3 .  Azelastine HCl 137 MCG/SPRAY SOLN, Place 2 sprays into the nose daily., Disp: 30 mL, Rfl: 2 .  BREZTRI AEROSPHERE 160-9-4.8 MCG/ACT AERO, USE 2 INHALATIONS BY MOUTH  DAILY, Disp: 21.4 g, Rfl: 3 .  clopidogrel (PLAVIX) 75 MG tablet, TAKE 1 TABLET BY MOUTH  DAILY, Disp: 90  tablet, Rfl: 3 .  diazepam (VALIUM) 5 MG tablet, Take 5 mg by mouth as needed for anxiety., Disp: , Rfl:  .  diclofenac Sodium (VOLTAREN) 1 % GEL, Apply topically 4 (four) times daily., Disp: , Rfl:  .  donepezil (ARICEPT) 5 MG tablet, TAKE 1 TABLET BY MOUTH  DAILY, Disp: 90 tablet, Rfl: 3 .  gabapentin (NEURONTIN) 100 MG capsule, TAKE 1 CAPSULE BY MOUTH 3  TIMES DAILY AS NEEDED, Disp: 270 capsule, Rfl: 3 .   Ginkgo Biloba 40 MG TABS, Take by mouth., Disp: , Rfl:  .  levocetirizine (XYZAL) 5 MG tablet, Take 1 tablet (5 mg total) by mouth every evening., Disp: 90 tablet, Rfl: 1 .  magic mouthwash w/lidocaine SOLN, Take 5 mLs by mouth 3 (three) times daily. 1 part benadryl, 1 part nystatin and 1 part lidocaine, Disp: 120 mL, Rfl: 0 .  meclizine (ANTIVERT) 25 MG tablet, TAKE 1 TABLET BY MOUTH 2  TIMES EVERY DAY AS NEEDED, Disp: 90 tablet, Rfl: 0 .  olmesartan-hydrochlorothiazide (BENICAR HCT) 40-12.5 MG tablet, Take 1 tablet by mouth daily., Disp: 90 tablet, Rfl: 3 .  ONETOUCH ULTRA test strip, USE WITH METER TO CHECK  BLOOD SUGAR BEFORE  BREAKFAST AND BEFORE DINNER, Disp: 200 strip, Rfl: 3 .  OVER THE COUNTER MEDICATION, , Disp: , Rfl:  .  Probiotic Product (PROBIOTIC-10 PO), Take by mouth. Take one tablet daily, Disp: , Rfl:  .  vitamin C (ASCORBIC ACID) 500 MG tablet, Take 500 mg by mouth daily., Disp: , Rfl:  .  VITAMIN D PO, Take 1 tablet by mouth daily., Disp: , Rfl:   Current Facility-Administered Medications:  .  0.9 %  sodium chloride infusion, 500 mL, Intravenous, Once, [name], MD   No Known Allergies   Review of Systems  Constitutional: Negative.   Respiratory: Negative.  Negative for shortness of breath.   Cardiovascular: Negative for chest pain.  Neurological: Negative for headaches.  Psychiatric/Behavioral: Negative.      Today's Vitals   01/19/20 1633  BP: 130/85  Pulse: 86  Temp: 97.9 F (36.6 C)  TempSrc: Oral  Weight: 190 lb 6.4 oz (86.4 kg)  Height: 5' 3.4" (1.61 m)  PainSc: 0-No pain   Body mass index is 33.3 kg/m.   Objective:  Physical Exam Vitals reviewed.  Constitutional:      General: She is not in acute distress.    Appearance: Normal appearance. She is obese.  HENT:     Nose: No rhinorrhea.  Cardiovascular:     Rate and Rhythm: Normal rate and regular rhythm.     Pulses: Normal pulses.     Heart sounds: Normal heart sounds. No murmur  heard.   Pulmonary:     Effort: Pulmonary effort is normal. No respiratory distress.     Breath sounds: Normal breath sounds. No wheezing.  Skin:    General: Skin is warm and dry.     Capillary Refill: Capillary refill takes less than 2 seconds.     Findings: Rash present.  Neurological:     General: No focal deficit present.     Mental Status: She is alert and oriented to person, place, and time.     Cranial Nerves: No cranial nerve deficit.  Psychiatric:        Mood and Affect: Mood normal.        Behavior: Behavior normal.        Thought Content: Thought content normal.  Judgment: Judgment normal.         Assessment And Plan:     1. Type 2 diabetes mellitus with other specified complication, without long-term current use of insulin (HCC)  Chronic, was 7 last visit  She is willing to take glucose control vs metformin or rybelsus at this time  Will check levels she will come next week for labs has not been drinking water today - Lipid panel - CMP14+EGFR - Hemoglobin A1c  2. Essential hypertension . B/P is fairly controlled.  . CMP ordered to check renal function.  . The importance of regular exercise and dietary modification was stressed to the patient.   3. BMI 33.0-33.9,adult  Chronic  Discussed healthy diet and regular exercise options   Encouraged to exercise at least 150 minutes per week with 2 days of strength training  4. Tingling in extremities  Offered to call in compound cream she declines at this time will continue using cortisone cream  5. Peripheral vascular disease ([place])  continue with daily aspirin   Patient was given opportunity to ask questions. Patient verbalized understanding of the plan and was able to repeat key elements of the plan. All questions were answered to their satisfaction.  Donna Brine, FNP   I, Donna Brine, FNP, have reviewed all documentation for this visit. The documentation on 01/19/20 for the exam, diagnosis,  procedures, and orders are all accurate and complete.   THE PATIENT IS ENCOURAGED TO PRACTICE SOCIAL DISTANCING DUE TO THE COVID-19 PANDEMIC.

## 2020-01-19 NOTE — Patient Instructions (Signed)

## 2020-01-25 ENCOUNTER — Encounter: Payer: Self-pay | Admitting: Nurse Practitioner

## 2020-01-26 ENCOUNTER — Other Ambulatory Visit: Payer: Medicare Other

## 2020-02-01 ENCOUNTER — Encounter: Payer: Self-pay | Admitting: Nurse Practitioner

## 2020-02-16 NOTE — Telephone Encounter (Signed)
na

## 2020-03-21 ENCOUNTER — Other Ambulatory Visit: Payer: Self-pay | Admitting: Nurse Practitioner

## 2020-03-22 ENCOUNTER — Other Ambulatory Visit: Payer: Self-pay

## 2020-03-22 DIAGNOSIS — Z8709 Personal history of other diseases of the respiratory system: Secondary | ICD-10-CM

## 2020-03-22 MED ORDER — BREZTRI AEROSPHERE 160-9-4.8 MCG/ACT IN AERO: INHALATION_SPRAY | RESPIRATORY_TRACT | 2 refills | Status: DC

## 2020-03-23 ENCOUNTER — Encounter: Payer: Self-pay | Admitting: Nurse Practitioner

## 2020-04-18 ENCOUNTER — Other Ambulatory Visit: Payer: Self-pay

## 2020-04-18 ENCOUNTER — Encounter: Payer: Self-pay | Admitting: Nurse Practitioner

## 2020-04-18 ENCOUNTER — Ambulatory Visit (INDEPENDENT_AMBULATORY_CARE_PROVIDER_SITE_OTHER): Payer: Medicare Other | Admitting: Nurse Practitioner

## 2020-04-18 VITALS — BP 116/80 | HR 78 | Temp 98.1°F | Ht 63.2 in | Wt 191.4 lb

## 2020-04-18 DIAGNOSIS — I10 Essential (primary) hypertension: Secondary | ICD-10-CM

## 2020-04-18 DIAGNOSIS — R5383 Other fatigue: Secondary | ICD-10-CM

## 2020-04-18 DIAGNOSIS — E1169 Type 2 diabetes mellitus with other specified complication: Secondary | ICD-10-CM | POA: Diagnosis not present

## 2020-04-18 DIAGNOSIS — E782 Mixed hyperlipidemia: Secondary | ICD-10-CM | POA: Diagnosis not present

## 2020-04-18 DIAGNOSIS — R413 Other amnesia: Secondary | ICD-10-CM | POA: Diagnosis not present

## 2020-04-18 DIAGNOSIS — L298 Other pruritus: Secondary | ICD-10-CM | POA: Diagnosis not present

## 2020-04-18 MED ORDER — METFORMIN HCL ER 500 MG PO TB24: 500.0000 mg | ORAL_TABLET | Freq: Every day | ORAL | 1 refills | Status: DC

## 2020-04-18 NOTE — Progress Notes (Signed)
I,Yamilka Roman [employer] as a Education administrator for [employer], FNP.,have documented all relevant documentation on the behalf of [name], FNP,as directed by  [name], FNP while in the presence of [name], [place]. This visit occurred during the SARS-CoV-2 public health emergency.  Safety protocols were in place, including screening questions prior to the visit, additional usage of staff PPE, and extensive cleaning of exam room while observing appropriate contact time as indicated for disinfecting solutions.  Subjective:     Patient ID: Donna Gilmore , female    DOB: 05/06/1940 , 80 y.o.   MRN: [id number]   Chief Complaint  Patient presents with  . Diabetes  . Hypertension    HPI  Patient here for a blood pressure and diabetes f/u.  She states "she has been better since her last visit".  She was seen by the insurance NP and her HgbA1c was 8.1 this was done on 04/11/2020.  She is reporting she has dry eye and is using an eye drop from the ophthalmologist but does not help.  She is trying to drink more water.   Wt Readings from Last 3 Encounters: 01/19/20 : 190 lb 6.4 oz (86.4 kg) 10/19/19 : 182 lb (82.6 kg) 08/16/19 : 178 lb 3.2 oz (80.8 kg)  Diabetes She presents for her follow-up diabetic visit. She has type 2 diabetes mellitus. Her disease course has been stable. There are no hypoglycemic associated symptoms. Pertinent negatives for hypoglycemia include no dizziness or headaches. There are no diabetic associated symptoms. Pertinent negatives for diabetes include no chest pain. There are no hypoglycemic complications. There are no diabetic complications. Risk factors for coronary artery disease include obesity and sedentary lifestyle. She has not had a previous visit with a dietitian. She rarely participates in exercise. (Blood sugar 105-187.  ) She does not see a podiatrist.Eye exam is not current.  Hypertension This is a chronic problem. The current episode started more than 1  year ago. The problem is unchanged. The problem is controlled. Pertinent negatives include no anxiety, chest pain, headaches, palpitations or shortness of breath. Risk factors for coronary artery disease include obesity and sedentary lifestyle. There are no compliance problems.  There is no history of angina. There is no history of chronic renal disease.     Past Medical History:  Diagnosis Date  . Benign paroxysmal positional vertigo 08/26/2012  . CVA (cerebral infarction) 08/25/2012  . Diabetes mellitus without complication ([place])   . Dyslipidemia 08/25/2012  . Essential hypertension, benign 08/25/2012  . Hyperlipidemia   . Hypertension   . Meralgia paraesthetica, left 01/12/2018  . Stroke [facility])      Family History  Problem Relation Age of Onset  . Kidney failure Mother   . Diabetes Mother   . Alzheimer's disease Mother   . Alzheimer's disease Father   . Prostate cancer Father   . Diabetes Maternal Grandmother   . Diabetes Maternal Grandfather   . Alzheimer's disease Paternal Grandmother   . Alzheimer's disease Paternal Grandfather   . Prostate cancer Paternal Uncle      Current Outpatient Medications:  .  acetaminophen (TYLENOL) 500 MG tablet, Take 1 tablet (500 mg total) by mouth every 6 (six) hours as needed., Disp: 30 tablet, Rfl: 0 .  albuterol (PROAIR HFA) 108 (90 Base) MCG/ACT inhaler, Inhale 2 puffs into the lungs every 6 (six) hours as needed for wheezing or shortness of breath., Disp: 18 g, Rfl: 2 .  atorvastatin (LIPITOR) 80 MG tablet, TAKE 1 TABLET  BY MOUTH  DAILY, Disp: 90 tablet, Rfl: 3 .  Azelastine HCl 137 MCG/SPRAY SOLN, Place 2 sprays into the nose daily., Disp: 30 mL, Rfl: 2 .  Budeson-Glycopyrrol-Formoterol (BREZTRI AEROSPHERE) 160-9-4.8 MCG/ACT AERO, USE 2 INHALATIONS BY MOUTH  DAILY, Disp: 32.1 g, Rfl: 2 .  clopidogrel (PLAVIX) 75 MG tablet, TAKE 1 TABLET BY MOUTH  DAILY, Disp: 90 tablet, Rfl: 3 .  diazepam (VALIUM) 5 MG tablet, Take 5 mg by mouth as needed  for anxiety., Disp: , Rfl:  .  diclofenac Sodium (VOLTAREN) 1 % GEL, Apply topically 4 (four) times daily., Disp: , Rfl:  .  donepezil (ARICEPT) 5 MG tablet, TAKE 1 TABLET BY MOUTH  DAILY, Disp: 90 tablet, Rfl: 3 .  gabapentin (NEURONTIN) 100 MG capsule, TAKE 1 CAPSULE BY MOUTH 3  TIMES DAILY AS NEEDED, Disp: 270 capsule, Rfl: 3 .  Ginkgo Biloba 40 MG TABS, Take by mouth., Disp: , Rfl:  .  levocetirizine (XYZAL) 5 MG tablet, Take 1 tablet (5 mg total) by mouth every evening., Disp: 90 tablet, Rfl: 1 .  magic mouthwash w/lidocaine SOLN, Take 5 mLs by mouth 3 (three) times daily. 1 part benadryl, 1 part nystatin and 1 part lidocaine, Disp: 120 mL, Rfl: 0 .  meclizine (ANTIVERT) 25 MG tablet, TAKE 1 TABLET BY MOUTH 2  TIMES EVERY DAY AS NEEDED, Disp: 90 tablet, Rfl: 0 .  metFORMIN (GLUCOPHAGE XR) 500 MG 24 hr tablet, Take 1 tablet (500 mg total) by mouth daily with breakfast. (Patient not taking: Reported on 04/21/2020), Disp: 90 tablet, Rfl: 1 .  olmesartan-hydrochlorothiazide (BENICAR HCT) 40-12.5 MG tablet, Take 1 tablet by mouth daily., Disp: 90 tablet, Rfl: 3 .  ONETOUCH ULTRA test strip, USE WITH METER TO CHECK  BLOOD SUGAR BEFORE  BREAKFAST AND BEFORE DINNER, Disp: 200 strip, Rfl: 3 .  OVER THE COUNTER MEDICATION, , Disp: , Rfl:  .  Probiotic Product (PROBIOTIC-10 PO), Take by mouth. Take one tablet daily, Disp: , Rfl:  .  vitamin C (ASCORBIC ACID) 500 MG tablet, Take 500 mg by mouth daily., Disp: , Rfl:  .  VITAMIN D PO, Take 1 tablet by mouth daily., Disp: , Rfl:  .  cholecalciferol (VITAMIN D3) 25 MCG (1000 UNIT) tablet, Take 1,000 Units by mouth daily., Disp: , Rfl:  .  vitamin B-12 (CYANOCOBALAMIN) 100 MCG tablet, Take 100 mcg by mouth daily., Disp: , Rfl:   Current Facility-Administered Medications:  .  0.9 %  sodium chloride infusion, 500 mL, Intravenous, Once, [name], MD   No Known Allergies   Review of Systems  Constitutional: Negative.   Respiratory: Negative.  Negative  for shortness of breath and wheezing.   Cardiovascular: Negative for chest pain, palpitations and leg swelling.  Gastrointestinal: Negative.   Musculoskeletal: Negative.   Neurological: Negative for dizziness and headaches.  Psychiatric/Behavioral: Negative.      Today's Vitals   04/18/20 1543  BP: 116/80  Pulse: 78  Temp: 98.1 F (36.7 C)  TempSrc: Oral  Weight: 191 lb 6.4 oz (86.8 kg)  Height: 5' 3.2" (1.605 m)  PainSc: 0-No pain   Body mass index is 33.69 kg/m.   Objective:  Physical Exam Constitutional:      General: She is not in acute distress.    Appearance: Normal appearance. She is normal weight.  Cardiovascular:     Rate and Rhythm: Normal rate and regular rhythm.     Pulses: Normal pulses.     Heart sounds: Normal heart  sounds.  Pulmonary:     Effort: Pulmonary effort is normal. No respiratory distress.     Breath sounds: Normal breath sounds. No wheezing.  Abdominal:     General: Abdomen is flat. Bowel sounds are normal.     Palpations: Abdomen is soft.  Musculoskeletal:        General: Normal range of motion.     Cervical back: Normal range of motion and neck supple.  Skin:    General: Skin is warm and dry.     Capillary Refill: Capillary refill takes less than 2 seconds.  Neurological:     General: No focal deficit present.     Mental Status: She is alert and oriented to person, place, and time.     Cranial Nerves: No cranial nerve deficit.     Motor: No weakness.  Psychiatric:        Mood and Affect: Mood normal.        Behavior: Behavior normal.        Thought Content: Thought content normal.        Judgment: Judgment normal.                                                                                             Assessment And Plan:     1. Essential hypertension  Chronic, blood pressure is well controlled  Continue with current medications - AMB Referral to [place]  2. Type 2 diabetes  mellitus with other specified complication, without long-term current use of insulin (HCC)  Chronic, slightly elevated at last visit, she does not want to start a new medication for her diabetes. I have recommended glucose control supplement   Continue with current medications, I will refer her to a diabetic educator and [place]  Encouraged to limit intake of sugary foods and drinks  Encouraged to increase physical activity to 150 minutes per week  Eye exam is up to date - AMB Referral to [place] - Ambulatory referral to diabetic education - metFORMIN (GLUCOPHAGE XR) 500 MG 24 hr tablet; Take 1 tablet (500 mg total) by mouth daily with breakfast. (Patient not taking: Reported on 04/21/2020)  Dispense: 90 tablet; Refill: 1  3. Mixed hyperlipidemia  Chronic, controlled  Continue with current medications, tolerating medications well  4. Fatigue, unspecified type  Will check metabolic causes   Likely can be deconditioning as well   - CBC - TSH - Vitamin B12  5. Pruritus of both eyes  increased pollen in the air and could be causing her itching, she also has dry eye and is being followed by ophthalmology she does not have any drainage or discoloration to her eyes   Patient was given opportunity to ask questions. Patient verbalized understanding of the plan and was able to repeat key elements of the plan. All questions were answered to their satisfaction.  [name], FNP   I, [name], FNP, have reviewed all documentation for this visit. The documentation on 04/26/20 for the exam, diagnosis, procedures, and orders are all accurate and complete.   IF YOU HAVE BEEN REFERRED TO  A SPECIALIST, IT MAY TAKE 1-2 WEEKS TO SCHEDULE/PROCESS THE REFERRAL. IF YOU HAVE NOT HEARD FROM US/SPECIALIST IN TWO WEEKS, PLEASE GIVE US A CALL AT [phone].   THE PATIENT IS ENCOURAGED TO PRACTICE SOCIAL DISTANCING DUE TO THE COVID-19 PANDEMIC.   

## 2020-04-18 NOTE — Patient Instructions (Signed)

## 2020-04-19 LAB — CMP14+EGFR
ALT: 18 IU/L (ref 0–32)
AST: 26 IU/L (ref 0–40)
Albumin/Globulin Ratio: 1.5 (ref 1.2–2.2)
Albumin: 4.4 g/dL (ref 3.7–4.7)
Alkaline Phosphatase: 105 IU/L (ref 44–121)
BUN/Creatinine Ratio: 11 — ABNORMAL LOW (ref 12–28)
BUN: 12 mg/dL (ref 8–27)
Bilirubin Total: 0.2 mg/dL (ref 0.0–1.2)
CO2: 23 mmol/L (ref 20–29)
Calcium: 9.8 mg/dL (ref 8.7–10.3)
Chloride: 99 mmol/L (ref 96–106)
Creatinine, Ser: 1.13 mg/dL — ABNORMAL HIGH (ref 0.57–1.00)
Globulin, Total: 3 g/dL (ref 1.5–4.5)
Glucose: 76 mg/dL (ref 65–99)
Potassium: 4.2 mmol/L (ref 3.5–5.2)
Sodium: 138 mmol/L (ref 134–144)
Total Protein: 7.4 g/dL (ref 6.0–8.5)
eGFR: 49 mL/min/{1.73_m2} — ABNORMAL LOW (ref >59)

## 2020-04-19 LAB — TSH: TSH: 3.6 u[IU]/mL (ref 0.450–4.500)

## 2020-04-19 LAB — CBC
Hematocrit: 33.6 % — ABNORMAL LOW (ref 34.0–46.6)
Hemoglobin: 11.2 g/dL (ref 11.1–15.9)
MCH: 28.9 pg (ref 26.6–33.0)
MCHC: 33.3 g/dL (ref 31.5–35.7)
MCV: 87 fL (ref 79–97)
Platelets: 258 10*3/uL (ref 150–450)
RBC: 3.87 x10E6/uL (ref 3.77–5.28)
RDW: 13.7 % (ref 11.7–15.4)
WBC: 8 10*3/uL (ref 3.4–10.8)

## 2020-04-19 LAB — VITAMIN B12: Vitamin B-12: 911 pg/mL (ref 232–1245)

## 2020-04-20 ENCOUNTER — Telehealth: Payer: Self-pay | Admitting: *Deleted

## 2020-04-20 NOTE — Chronic Care Management (AMB) (Signed)
  Chronic Care Management   Outreach Note  04/20/2020 Name: Donna Gilmore MRN: [id number] DOB: May 14, 1940  Donna Gilmore is a 80 y.o. year old female who is a primary care patient of [name], [place]. I reached out to [employer] by phone today in response to a referral sent by Ms. Candiss Norse Balboni's PCP, [name], FNP     An unsuccessful telephone outreach was attempted today. The patient was referred to the case management team for assistance with care management and care coordination.   Follow Up Plan: A HIPAA compliant phone message was left for the patient providing contact information and requesting a return call. The care management team will reach out to the patient again over the next 7 days. If patient returns call to provider office, please advise to call [place] at [phone].  [facility]

## 2020-04-21 ENCOUNTER — Other Ambulatory Visit: Payer: Self-pay

## 2020-04-21 ENCOUNTER — Encounter: Payer: Medicare Other | Attending: Nurse Practitioner | Admitting: Dietician

## 2020-04-21 ENCOUNTER — Encounter: Payer: Self-pay | Admitting: Dietician

## 2020-04-21 DIAGNOSIS — E119 Type 2 diabetes mellitus without complications: Secondary | ICD-10-CM | POA: Diagnosis not present

## 2020-04-21 NOTE — Patient Instructions (Addendum)
Increase exercise as tolerated.  Exercise can decrease your blood sugar.  Small amounts 3 times daily for example  Sit and Be Fit on You Tube or Silver Sneakers  Walking  Stay hydrated.  Be sure to drink enough water.  Choose Boost Glucose Control.  Omit Boost if you have eggs or meat for breakfast. Consider an alternative creamer without carbohydrates in your coffee.  Stress control:  Stress and illness can cause your blood sugar to increase.  Be mindful of your food choices.  Fewer cookies and ice cream - Small portion sizes and less frequent.  When you have a higher blood sugar ask yourself why?n  (Sometimes it is hard to identify and sometimes may not be within your control.)  Sick or stressed?  Higher fat or higher sugar meal or snack?  Ate too much?  Missed your exercise?  Did I drink wine?  Is my coffee making my blood sugar increase

## 2020-04-21 NOTE — Progress Notes (Signed)
Diabetes Self-Management Education  Visit Type: First/Initial  Appt. Start Time: 1030 Appt. End Time: 1140  04/21/2020  [name], identified by name and date of birth, is a 80 y.o. female with a diagnosis of Diabetes: Type 2.   ASSESSMENT Patient is here today alone.  She would like to learn how to better control her blood sugar.  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 She has stopped drinking dark soda due to her kidney's She states that she forgets to eat at times. She has been drinking vegetable smoothies with small amounts of fruit during the middle of the day. She was prescribed Metformin but does not want to take this and wishes to try to control her blood sugar with lifestyle and nutrition. She states that she was having problems sleeping, started CBD and this helped sleep (now 6-8 hours per night) as well as helped with pain.  History includes Type 2 Diabetes, HTN, HLD, CVA virtigo (Meclazine stopped working well).  Patient is hard of hearing (despite hearing aids) Labs noted to include eGFR 56 10/19/19, cholesterol 328, HDL 51, LDL 197, Triglycerides 391 10/19/19, A1C 7% 10/19/19 Medications include:  Metformin (has not started)  Weight hx: Weight 191 lbs 5'7" She states that she would like to lose weight.  Patient's oldest son lives with her.  She is retired from [employer].  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 She has an Environmental consultant that helps occasionally.  Her son does a lot of the shopping and she cooks. She states that she cannot exercise due to poor energy and shortness of breath. Silver Sneakers on line most days. Has problems chewing. She enjoys paint by number. She makes wine (likes it sweet) and drinks 4 ounces at times.  Height _0  (1.702 m), weight 191 lb (86.6  kg). Body mass index is 29.91 kg/m.   Diabetes Self-Management Education - 04/21/20 1057      Visit Information   Visit Type First/Initial      Initial Visit   Diabetes Type Type 2    Are you currently following a meal plan? No    Are you taking your medications as prescribed? No    Date Diagnosed 2018      Health Coping   How would you rate your overall health? Good      Psychosocial Assessment   Patient Belief/Attitude about Diabetes Motivated to manage diabetes    Self-care barriers Hard of hearing    Self-management support Doctor's office;Family    Other persons present Patient    Patient Concerns Nutrition/Meal planning    Special Needs None;Other (comment)   written information   Preferred Learning Style No preference indicated    Learning Readiness Ready    How often do you need to have someone help you when you read instructions, pamphlets, or other written materials from your doctor or pharmacy? 1 - Never    What is the last grade level you completed in school? 2 years college      Pre-Education Assessment   Patient understands the diabetes disease and treatment process. Needs Instruction    Patient understands incorporating nutritional management into lifestyle. Needs Instruction    Patient undertands incorporating physical activity into lifestyle. Needs Instruction    Patient understands using medications safely.  Needs Instruction    Patient understands monitoring blood glucose, interpreting and using results Needs Instruction    Patient understands prevention, detection, and treatment of acute complications. Needs Instruction    Patient understands prevention, detection, and treatment of chronic complications. Needs Instruction    Patient understands how to develop strategies to address psychosocial issues. Needs Instruction    Patient understands how to develop strategies to promote health/change behavior. Needs Instruction      Complications   Last HgB A1C per  patient/outside source 7 %   10/19/19   How often do you check your blood sugar? 1-2 times/day    Fasting Blood glucose range (mg/dL) 70-129;130-179   120-150   Postprandial Blood glucose range (mg/dL) 130-179    Number of hypoglycemic episodes per month 0    Number of hyperglycemic episodes per week 1    Can you tell when your blood sugar is high? No      Dietary Intake   Breakfast coffee with sweetened creamer, oatmeal or cereal with strawberries, Boost    Lunch vegetable smoothie:  kale, spinach, broccoli, creasy greens, strawberries, ginger powder, beet powder, occasional yogurt.    Snack (afternoon) occasional small ice cream    Dinner Turnip greens, boiled chicken, mac and cheese, cornbread OR sandwich OR soup   6 pm   Beverage(s) water, 3 cups coffee with sweetend creamer, tea with vinegar, olive oil, honey, homemade wine (4 oz) "not daily", regular Boost      Exercise   Exercise Type ADL's      Patient Education   Previous Diabetes Education Yes (please comment)   when caring for mother and mother-in-law years ago   Disease state  Definition of diabetes, type 1 and 2, and the diagnosis of diabetes    Nutrition management  Role of diet in the treatment of diabetes and the relationship between the three main macronutrients and blood glucose level;Food label reading, portion sizes and measuring food.;Meal options for control of blood glucose level and chronic complications.    Physical activity and exercise  Role of exercise on diabetes management, blood pressure control and cardiac health.;Helped patient identify appropriate exercises in relation to his/her diabetes, diabetes complications and other health issue.    Medications Reviewed patients medication for diabetes, action, purpose, timing of dose and side effects.    Monitoring Taught/discussed recording of test results and interpretation of SMBG.;Identified appropriate SMBG and/or A1C goals.    Acute complications Other (comment)       Individualized Goals (developed by patient)   Nutrition General guidelines for healthy choices and portions discussed    Physical Activity Exercise 5-7 days per week;15 minutes per day    Monitoring  test my blood glucose as discussed    Reducing Risk examine blood glucose patterns;Other (comment)   decrease cookies and ice cream     Post-Education Assessment   Patient understands the diabetes disease and treatment process. Demonstrates understanding / competency    Patient understands incorporating nutritional management into lifestyle. Needs Review    Patient undertands incorporating physical activity into lifestyle. Demonstrates understanding / competency    Patient understands using medications safely. Demonstrates understanding / competency    Patient understands monitoring blood glucose, interpreting and using results Demonstrates understanding / competency    Patient understands prevention, detection, and treatment of acute complications. Demonstrates understanding / competency    Patient understands prevention, detection, and treatment of chronic complications. Demonstrates understanding / competency    Patient understands how to  develop strategies to address psychosocial issues. Demonstrates understanding / competency    Patient understands how to develop strategies to promote health/change behavior. Needs Review      Outcomes   Expected Outcomes Demonstrated interest in learning. Expect positive outcomes    Future DMSE 2 months    Program Status Not Completed           Individualized Plan for Diabetes Self-Management Training:   Learning Objective:  Patient will have a greater understanding of diabetes self-management. Patient education plan is to attend individual and/or group sessions per assessed needs and concerns.   Plan:   Patient Instructions  Increase exercise as tolerated.  Exercise can decrease your blood sugar.  Small amounts 3 times daily for  example  Sit and Be Fit on You Tube or Silver Sneakers  Walking  Stay hydrated.  Be sure to drink enough water.  Choose Boost Glucose Control.  Omit Boost if you have eggs or meat for breakfast. Consider an alternative creamer without carbohydrates in your coffee.  Stress control:  Stress and illness can cause your blood sugar to increase.  Be mindful of your food choices.  Fewer cookies and ice cream - Small portion sizes and less frequent.  When you have a higher blood sugar ask yourself why?n  (Sometimes it is hard to identify and sometimes may not be within your control.)  Sick or stressed?  Higher fat or higher sugar meal or snack?  Ate too much?  Missed your exercise?  Did I drink wine?  Is my coffee making my blood sugar increase     Expected Outcomes:  Demonstrated interest in learning. Expect positive outcomes  Education material provided: ADA - How to Thrive: A Guide for Your Journey with Diabetes, Food label handouts, Meal plan card, My Plate and Snack sheet  If problems or questions, patient to contact team via:  Phone and Email  Future DSME appointment: 2 months

## 2020-04-26 NOTE — Chronic Care Management (AMB) (Signed)
  Chronic Care Management   Note  04/26/2020 Name: Donna Gilmore MRN: [id number] DOB: [birth year]  Donna Gilmore is a 80 y.o. year old female who is a primary care patient of [name], [place]. I reached out to [employer] by phone today in response to a referral sent by [name] PCP, [name], FNP.      [name] was given information about Chronic Care Management services today including:  1. CCM service includes personalized support from designated clinical staff supervised by her physician, including individualized plan of care and coordination with other care providers 2. 24/7 contact phone numbers for assistance for urgent and routine care needs. 3. Service will only be billed when office clinical staff spend 20 minutes or more in a month to coordinate care. 4. Only one practitioner may furnish and bill the service in a calendar month. 5. The patient may stop CCM services at any time (effective at the end of the month) by phone call to the office staff. 6. The patient will be responsible for cost sharing (co-pay) of up to 20% of the service fee (after annual deductible is met).  Patient agreed to services and verbal consent obtained.   Follow up plan: Telephone appointment with care management team member scheduled for:05/25/2020  Verlena Marlette  Care Guide, Embedded Care Coordination [place]  Care Management

## 2020-04-27 ENCOUNTER — Other Ambulatory Visit: Payer: Self-pay | Admitting: Nurse Practitioner

## 2020-04-27 ENCOUNTER — Encounter: Payer: Self-pay | Admitting: Nurse Practitioner

## 2020-04-27 MED ORDER — MECLIZINE HCL 25 MG PO TABS: ORAL_TABLET | ORAL | 1 refills | Status: DC

## 2020-05-08 ENCOUNTER — Encounter: Payer: Self-pay | Admitting: Nurse Practitioner

## 2020-05-09 ENCOUNTER — Other Ambulatory Visit: Payer: Self-pay

## 2020-05-09 MED ORDER — MECLIZINE HCL 25 MG PO TABS: ORAL_TABLET | ORAL | 1 refills | Status: DC

## 2020-05-10 ENCOUNTER — Encounter: Payer: Self-pay | Admitting: Nurse Practitioner

## 2020-05-16 ENCOUNTER — Ambulatory Visit (INDEPENDENT_AMBULATORY_CARE_PROVIDER_SITE_OTHER): Payer: Medicare Other | Admitting: Nurse Practitioner

## 2020-05-16 ENCOUNTER — Other Ambulatory Visit: Payer: Self-pay

## 2020-05-16 ENCOUNTER — Encounter: Payer: Self-pay | Admitting: Nurse Practitioner

## 2020-05-16 VITALS — BP 132/78 | HR 67 | Temp 98.0°F | Ht 67.0 in | Wt 190.0 lb

## 2020-05-16 DIAGNOSIS — Z23 Encounter for immunization: Secondary | ICD-10-CM | POA: Diagnosis not present

## 2020-05-16 DIAGNOSIS — E6609 Other obesity due to excess calories: Secondary | ICD-10-CM | POA: Diagnosis not present

## 2020-05-16 DIAGNOSIS — I10 Essential (primary) hypertension: Secondary | ICD-10-CM

## 2020-05-16 DIAGNOSIS — Z8709 Personal history of other diseases of the respiratory system: Secondary | ICD-10-CM | POA: Diagnosis not present

## 2020-05-16 DIAGNOSIS — E1169 Type 2 diabetes mellitus with other specified complication: Secondary | ICD-10-CM | POA: Diagnosis not present

## 2020-05-16 DIAGNOSIS — Z6832 Body mass index (BMI) 32.0-32.9, adult: Secondary | ICD-10-CM

## 2020-05-16 MED ORDER — RYBELSUS 7 MG PO TABS: 1.0000 | ORAL_TABLET | Freq: Every day | ORAL | 0 refills | Status: DC

## 2020-05-16 NOTE — Patient Instructions (Addendum)
Hypertension, Adult Hypertension is another name for high blood pressure. High blood pressure forces your heart to work harder to pump blood. This can cause problems over time. There are two numbers in a blood pressure reading. There is a top number (systolic) over a bottom number (diastolic). It is best to have a blood pressure that is below 120/80. Healthy choices can help lower your blood pressure, or you may need medicine to help lower it. What are the causes? The cause of this condition is not known. Some conditions may be related to high blood pressure. What increases the risk?  Smoking.  Having type 2 diabetes mellitus, high cholesterol, or both.  Not getting enough exercise or physical activity.  Being overweight.  Having too much fat, sugar, calories, or salt (sodium) in your diet.  Drinking too much alcohol.  Having long-term (chronic) kidney disease.  Having a family history of high blood pressure.  Age. Risk increases with age.  Race. You may be at higher risk if you are African American.  Gender. Men are at higher risk than women before age 45. After age 65, women are at higher risk than men.  Having obstructive sleep apnea.  Stress. What are the signs or symptoms?  High blood pressure may not cause symptoms. Very high blood pressure (hypertensive crisis) may cause: ? Headache. ? Feelings of worry or nervousness (anxiety). ? Shortness of breath. ? Nosebleed. ? A feeling of being sick to your stomach (nausea). ? Throwing up (vomiting). ? Changes in how you see. ? Very bad chest pain. ? Seizures. How is this treated?  This condition is treated by making healthy lifestyle changes, such as: ? Eating healthy foods. ? Exercising more. ? Drinking less alcohol.  Your health care provider may prescribe medicine if lifestyle changes are not enough to get your blood pressure under control, and if: ? Your top number is above 130. ? Your bottom number is above  80.  Your personal target blood pressure may vary. Follow these instructions at home: Eating and drinking  If told, follow the DASH eating plan. To follow this plan: ? Fill one half of your plate at each meal with fruits and vegetables. ? Fill one fourth of your plate at each meal with whole grains. Whole grains include whole-wheat pasta, brown rice, and whole-grain bread. ? Eat or drink low-fat dairy products, such as skim milk or low-fat yogurt. ? Fill one fourth of your plate at each meal with low-fat (lean) proteins. Low-fat proteins include fish, chicken without skin, eggs, beans, and tofu. ? Avoid fatty meat, cured and processed meat, or chicken with skin. ? Avoid pre-made or processed food.  Eat less than 1,500 mg of salt each day.  Do not drink alcohol if: ? Your doctor tells you not to drink. ? You are pregnant, may be pregnant, or are planning to become pregnant.  If you drink alcohol: ? Limit how much you use to:  0-1 drink a day for women.  0-2 drinks a day for men. ? Be aware of how much alcohol is in your drink. In the [place] one drink equals one 12 oz bottle of beer (355 mL), one 5 oz glass of wine (148 mL), or one 1 oz glass of hard liquor (44 mL).   Lifestyle  Work with your doctor to stay at a healthy weight or to lose weight. Ask your doctor what the best weight is for you.  Get at least 30 minutes of exercise most   days of the week. This may include walking, swimming, or biking.  Get at least 30 minutes of exercise that strengthens your muscles (resistance exercise) at least 3 days a week. This may include lifting weights or doing Pilates.  Do not use any products that contain nicotine or tobacco, such as cigarettes, e-cigarettes, and chewing tobacco. If you need help quitting, ask your doctor.  Check your blood pressure at home as told by your doctor.  Keep all follow-up visits as told by your doctor. This is important.   Medicines  Take over-the-counter  and prescription medicines only as told by your doctor. Follow directions carefully.  Do not skip doses of blood pressure medicine. The medicine does not work as well if you skip doses. Skipping doses also puts you at risk for problems.  Ask your doctor about side effects or reactions to medicines that you should watch for. Contact a doctor if you:  Think you are having a reaction to the medicine you are taking.  Have headaches that keep coming back (recurring).  Feel dizzy.  Have swelling in your ankles.  Have trouble with your vision. Get help right away if you:  Get a very bad headache.  Start to feel mixed up (confused).  Feel weak or numb.  Feel faint.  Have very bad pain in your: ? Chest. ? Belly (abdomen).  Throw up more than once.  Have trouble breathing. Summary  Hypertension is another name for high blood pressure.  High blood pressure forces your heart to work harder to pump blood.  For most people, a normal blood pressure is less than 120/80.  Making healthy choices can help lower blood pressure. If your blood pressure does not get lower with healthy choices, you may need to take medicine. This information is not intended to replace advice given to you by your health care provider. Make sure you discuss any questions you have with your health care provider. Document Revised: 09/24/2017 Document Reviewed: 09/24/2017 Elsevier Patient Education  [place]. Diabetes Mellitus and Exercise Exercising regularly is important for overall health, especially for people who have diabetes mellitus. Exercising is not only about losing weight. It has many other health benefits, such as increasing muscle strength and bone density and reducing body fat and stress. This leads to improved fitness, flexibility, and endurance, all of which result in better overall health. What are the benefits of exercise if I have diabetes? Exercise has many benefits for people with  diabetes. They include:  Helping to lower and control blood sugar (glucose).  Helping the body to respond better to the hormone insulin by improving insulin sensitivity.  Reducing how much insulin the body needs.  Lowering the risk for heart disease by: ? Lowering "bad" cholesterol and triglyceride levels. ? Increasing "good" cholesterol levels. ? Lowering blood pressure. ? Lowering blood glucose levels. What is my activity plan? Your health care provider or certified diabetes educator can help you make a plan for the type and frequency of exercise that works for you. This is called your activity plan. Be sure to:  Get at least 150 minutes of medium-intensity or high-intensity exercise each week. Exercises may include brisk walking, biking, or water aerobics.  Do stretching and strengthening exercises, such as yoga or weight lifting, at least 2 times a week.  Spread out your activity over at least 3 days of the week.  Get some form of physical activity each day. ? Do not go more than 2 days in  a row without some kind of physical activity. ? Avoid being inactive for more than 90 minutes at a time. Take frequent breaks to walk or stretch.  Choose exercises or activities that you enjoy. Set realistic goals.  Start slowly and gradually increase your exercise intensity over time.   How do I manage my diabetes during exercise? Monitor your blood glucose  Check your blood glucose before and after exercising. If your blood glucose is: ? 240 mg/dL (13.3 mmol/L) or higher before you exercise, check your urine for ketones. These are chemicals created by the liver. If you have ketones in your urine, do not exercise until your blood glucose returns to normal. ? 100 mg/dL (5.6 mmol/L) or lower, eat a snack containing 15-20 grams of carbohydrate. Check your blood glucose 15 minutes after the snack to make sure that your glucose level is above 100 mg/dL (5.6 mmol/L) before you start your  exercise.  Know the symptoms of low blood glucose (hypoglycemia) and how to treat it. Your risk for hypoglycemia increases during and after exercise. Follow these tips and your health care provider's instructions  Keep a carbohydrate snack that is fast-acting for use before, during, and after exercise to help prevent or treat hypoglycemia.  Avoid injecting insulin into areas of the body that are going to be exercised. For example, avoid injecting insulin into: ? Your arms, when you are about to play tennis. ? Your legs, when you are about to go jogging.  Keep records of your exercise habits. Doing this can help you and your health care provider adjust your diabetes management plan as needed. Write down: ? Food that you eat before and after you exercise. ? Blood glucose levels before and after you exercise. ? The type and amount of exercise you have done.  Work with your health care provider when you start a new exercise or activity. He or she may need to: ? Make sure that the activity is safe for you. ? Adjust your insulin, other medicines, and food that you eat.  Drink plenty of water while you exercise. This prevents loss of water (dehydration) and problems caused by a lot of heat in the body (heat stroke).   Where to find more information  American Diabetes Association: www.diabetes.org Summary  Exercising regularly is important for overall health, especially for people who have diabetes mellitus.  Exercising has many health benefits. It increases muscle strength and bone density and reduces body fat and stress. It also lowers and controls blood glucose.  Your health care provider or certified diabetes educator can help you make an activity plan for the type and frequency of exercise that works for you.  Work with your health care provider to make sure any new activity is safe for you. Also work with your health care provider to adjust your insulin, other medicines, and the food  you eat. This information is not intended to replace advice given to you by your health care provider. Make sure you discuss any questions you have with your health care provider. Document Revised: 10/12/2018 Document Reviewed: 10/12/2018 Elsevier Patient Education  2021 [place].    Call the office if your blood pressure is less than 100/60 call to the office.    I will start you on Rybelsus take 1 tablet daily 30 minutes before breakfast

## 2020-05-16 NOTE — Progress Notes (Signed)
Rutherford Nail as a scribe for [name], FNP.,have documented all relevant documentation on the behalf of [name], FNP,as directed by  [name], FNP while in the presence of [name], [place]. This visit occurred during the SARS-CoV-2 public health emergency.  Safety protocols were in place, including screening questions prior to the visit, additional usage of staff PPE, and extensive cleaning of exam room while observing appropriate contact time as indicated for disinfecting solutions.  Subjective:     Patient ID: Donna Gilmore , female    DOB: [birth year] , 80 y.o.   MRN: [id number]   Chief Complaint  Patient presents with  . Diabetes  . Hypertension  . Nutrition Counseling    Would like to speak to you about the diet plan from the nutritionist     HPI  She is here to ask questions about her blood sugar and blood pressure to know when to call. She is appreciative of the referral to the nutritionist. Blood sugar is not higher than 142. She continues to have no energy.  She feels like when she walks from her bedroom to the kitchen she is short of breath.  She is concerned about her weight gain, which she shares at each visit. She does not do much activity and tends to eat some not so healthy foods.     Past Medical History:  Diagnosis Date  . Benign paroxysmal positional vertigo 08/26/2012  . CVA (cerebral infarction) 08/25/2012  . Diabetes mellitus without complication ([place])   . Dyslipidemia 08/25/2012  . Essential hypertension, benign 08/25/2012  . Hyperlipidemia   . Hypertension   . Meralgia paraesthetica, left 01/12/2018  . Stroke [facility])      Family History  Problem Relation Age of Onset  . Kidney failure Mother   . Diabetes Mother   . Alzheimer's disease Mother   . Alzheimer's disease Father   . Prostate cancer Father   . Diabetes Maternal Grandmother   . Diabetes Maternal Grandfather   . Alzheimer's disease Paternal Grandmother   . Alzheimer's  disease Paternal Grandfather   . Prostate cancer Paternal Uncle      Current Outpatient Medications:  .  acetaminophen (TYLENOL) 500 MG tablet, Take 1 tablet (500 mg total) by mouth every 6 (six) hours as needed., Disp: 30 tablet, Rfl: 0 .  albuterol (PROAIR HFA) 108 (90 Base) MCG/ACT inhaler, Inhale 2 puffs into the lungs every 6 (six) hours as needed for wheezing or shortness of breath., Disp: 18 g, Rfl: 2 .  atorvastatin (LIPITOR) 80 MG tablet, TAKE 1 TABLET BY MOUTH  DAILY, Disp: 90 tablet, Rfl: 3 .  Azelastine HCl 137 MCG/SPRAY SOLN, Place 2 sprays into the nose daily., Disp: 30 mL, Rfl: 2 .  Budeson-Glycopyrrol-Formoterol (BREZTRI AEROSPHERE) 160-9-4.8 MCG/ACT AERO, USE 2 INHALATIONS BY MOUTH  DAILY, Disp: 32.1 g, Rfl: 2 .  cholecalciferol (VITAMIN D3) 25 MCG (1000 UNIT) tablet, Take 1,000 Units by mouth daily., Disp: , Rfl:  .  clopidogrel (PLAVIX) 75 MG tablet, TAKE 1 TABLET BY MOUTH  DAILY, Disp: 90 tablet, Rfl: 3 .  diazepam (VALIUM) 5 MG tablet, Take 5 mg by mouth as needed for anxiety., Disp: , Rfl:  .  diclofenac Sodium (VOLTAREN) 1 % GEL, Apply topically 4 (four) times daily., Disp: , Rfl:  .  donepezil (ARICEPT) 5 MG tablet, TAKE 1 TABLET BY MOUTH  DAILY, Disp: 90 tablet, Rfl: 3 .  gabapentin (NEURONTIN) 100 MG capsule, TAKE 1 CAPSULE BY MOUTH 3  TIMES DAILY AS NEEDED, Disp: 270 capsule, Rfl: 3 .  Ginkgo Biloba 40 MG TABS, Take by mouth., Disp: , Rfl:  .  levocetirizine (XYZAL) 5 MG tablet, Take 1 tablet (5 mg total) by mouth every evening., Disp: 90 tablet, Rfl: 1 .  magic mouthwash w/lidocaine SOLN, Take 5 mLs by mouth 3 (three) times daily. 1 part benadryl, 1 part nystatin and 1 part lidocaine, Disp: 120 mL, Rfl: 0 .  meclizine (ANTIVERT) 25 MG tablet, TAKE 1 TABLET BY MOUTH 2  TIMES EVERY DAY AS NEEDED, Disp: 90 tablet, Rfl: 1 .  olmesartan-hydrochlorothiazide (BENICAR HCT) 40-12.5 MG tablet, Take 1 tablet by mouth daily., Disp: 90 tablet, Rfl: 3 .  ONETOUCH ULTRA test strip,  USE WITH METER TO CHECK  BLOOD SUGAR BEFORE  BREAKFAST AND BEFORE DINNER, Disp: 200 strip, Rfl: 3 .  OVER THE COUNTER MEDICATION, , Disp: , Rfl:  .  Probiotic Product (PROBIOTIC-10 PO), Take by mouth. Take one tablet daily, Disp: , Rfl:  .  Semaglutide (RYBELSUS) 7 MG TABS, Take 1 tablet by mouth daily., Disp: 90 tablet, Rfl: 0 .  vitamin B-12 (CYANOCOBALAMIN) 100 MCG tablet, Take 100 mcg by mouth daily., Disp: , Rfl:  .  vitamin C (ASCORBIC ACID) 500 MG tablet, Take 500 mg by mouth daily., Disp: , Rfl:  .  VITAMIN D PO, Take 1 tablet by mouth daily., Disp: , Rfl:  .  metFORMIN (GLUCOPHAGE XR) 500 MG 24 hr tablet, Take 1 tablet (500 mg total) by mouth daily with breakfast. (Patient not taking: Reported on 05/16/2020), Disp: 90 tablet, Rfl: 1  Current Facility-Administered Medications:  .  0.9 %  sodium chloride infusion, 500 mL, Intravenous, Once, [name], MD   No Known Allergies   Review of Systems  Constitutional: Negative.   HENT: Negative.   Respiratory: Negative.   Cardiovascular: Negative.   Gastrointestinal: Negative.   Musculoskeletal: Negative.   Skin: Negative.   Neurological: Negative.   Psychiatric/Behavioral: Negative.      Today's Vitals   05/16/20 1527  BP: 132/78  Pulse: 67  Temp: 98 F (36.7 C)  TempSrc: Oral  Weight: 190 lb (86.2 kg)  Height: 5\' 7"  (1.702 m)   Body mass index is 29.76 kg/m.  Wt Readings from Last 3 Encounters:  05/16/20 190 lb (86.2 kg)  04/21/20 191 lb (86.6 kg)  04/18/20 191 lb 6.4 oz (86.8 kg)   Objective:  Physical Exam Constitutional:      General: She is not in acute distress.    Appearance: Normal appearance. She is obese.  HENT:     Ears:     Comments: She is not wearing her hearing aids Cardiovascular:     Rate and Rhythm: Normal rate and regular rhythm.     Pulses: Normal pulses.     Heart sounds: Normal heart sounds. No murmur heard.   Pulmonary:     Effort: Pulmonary effort is normal. No respiratory  distress.     Breath sounds: Normal breath sounds. No wheezing.  Abdominal:     General: Abdomen is flat. Bowel sounds are normal.     Palpations: Abdomen is soft.  Musculoskeletal:        General: Normal range of motion.     Cervical back: Normal range of motion and neck supple.  Skin:    General: Skin is warm and dry.     Capillary Refill: Capillary refill takes less than 2 seconds.  Neurological:     General: No  focal deficit present.     Mental Status: She is alert and oriented to person, place, and time.     Cranial Nerves: No cranial nerve deficit.     Motor: No weakness.  Psychiatric:        Mood and Affect: Mood normal.        Behavior: Behavior normal.        Thought Content: Thought content normal.        Judgment: Judgment normal.         Assessment And Plan:     1. Essential hypertension . B/P is controlled.  . CMP ordered to check renal function.  . The importance of regular exercise and dietary modification was stressed to the patient.  . Stressed importance of losing ten percent of her body weight to help with B/P control.  . The weight loss would help with decreasing cardiac and cancer risk as well.  - AMB Referral to [place]  2. Type 2 diabetes mellitus with other specified complication, without long-term current use of insulin (HCC)  Chronic, controlled  Continue with current medications  Encouraged to limit intake of sugary foods and drinks  Encouraged to increase physical activity to 150 minutes per week  She would benefit from a nutritionist.   She is going to try Rybelsus to help her diabetes and may also help with losing weight  discussed side effects to include nausea, difficulty swallowing and abdominal pain.  - [facility]B Referral to [facility] - Semaglutide (RYBELSUS) 7 MG TABS; Take 1 tablet by mouth daily.  Dispense: 90 tablet; Refill: 0 - Amb ref to Medical Nutrition Therapy-[facility]  3. History of COPD  Will  refer to pulmonology for further evaluation.  I have tried her on Brevespi which has not been effective  - AMB Referral to [place] - Ambulatory referral to Pulmonology  4. Encounter for immunization - Pneumococcal polysaccharide vaccine 23-valent greater than or equal to 2yo subcutaneous/IM  5. Class 1 obesity due to excess calories without serious comorbidity with body mass index (BMI) of 32.0 to 32.9 in adult  Chronic  Discussed healthy diet and regular exercise options   She is encouraged to strive for BMI less than 30 to decrease cardiac risk. Advised to aim for at least 150 minutes of exercise per week. - Amb ref to Medical Nutrition Therapy-[facility]   Patient was given opportunity to ask questions. Patient verbalized understanding of the plan and was able to repeat key elements of the plan. All questions were answered to their satisfaction.  [name], FNP   I, [name], FNP, have reviewed all documentation for this visit. The documentation on 05/16/20 for the exam, diagnosis, procedures, and orders are all accurate and complete.   IF YOU HAVE BEEN REFERRED TO A SPECIALIST, IT MAY TAKE 1-2 WEEKS TO SCHEDULE/PROCESS THE REFERRAL. IF YOU HAVE NOT HEARD FROM US/SPECIALIST IN TWO WEEKS, PLEASE GIVE [place] A CALL AT [phone] X [phone].   THE PATIENT IS ENCOURAGED TO PRACTICE SOCIAL DISTANCING DUE TO THE COVID-19 PANDEMIC.

## 2020-05-23 ENCOUNTER — Encounter: Payer: Self-pay | Admitting: Nurse Practitioner

## 2020-05-25 ENCOUNTER — Ambulatory Visit (INDEPENDENT_AMBULATORY_CARE_PROVIDER_SITE_OTHER): Payer: Medicare Other

## 2020-05-25 ENCOUNTER — Telehealth: Payer: Medicare Other

## 2020-05-25 DIAGNOSIS — N1831 Chronic kidney disease, stage 3a: Secondary | ICD-10-CM | POA: Diagnosis not present

## 2020-05-25 DIAGNOSIS — E782 Mixed hyperlipidemia: Secondary | ICD-10-CM

## 2020-05-25 DIAGNOSIS — I10 Essential (primary) hypertension: Secondary | ICD-10-CM

## 2020-05-25 DIAGNOSIS — E1169 Type 2 diabetes mellitus with other specified complication: Secondary | ICD-10-CM | POA: Diagnosis not present

## 2020-05-25 DIAGNOSIS — Z8709 Personal history of other diseases of the respiratory system: Secondary | ICD-10-CM

## 2020-05-31 NOTE — Chronic Care Management (AMB) (Addendum)
Chronic Care Management   CCM RN Visit Note  05/25/2020 Name: Donna Gilmore MRN: [id number] DOB: [birth year]  Subjective: Donna Gilmore is a 80 y.o. year old female who is a primary care patient of [name], [place]. The care management team was consulted for assistance with disease management and care coordination needs.    Engaged with patient by telephone for initial visit in response to provider referral for case management and/or care coordination services.   Consent to Services:  The patient was given information about Chronic Care Management services, agreed to services, and gave verbal consent prior to initiation of services.  Please see initial visit note for detailed documentation.   Patient agreed to services and verbal consent obtained.   Assessment: Review of patient past medical history, allergies, medications, health status, including review of consultants reports, laboratory and other test data, was performed as part of comprehensive evaluation and provision of chronic care management services.   SDOH (Social Determinants of Health) assessments and interventions performed:  Yes, no acute needs   CCM Care Plan  No Known Allergies  Outpatient Encounter Medications as of 05/25/2020  Medication Sig  . acetaminophen (TYLENOL) 500 MG tablet Take 1 tablet (500 mg total) by mouth every 6 (six) hours as needed.  Marland Kitchen albuterol (PROAIR HFA) 108 (90 Base) MCG/ACT inhaler Inhale 2 puffs into the lungs every 6 (six) hours as needed for wheezing or shortness of breath.  Marland Kitchen atorvastatin (LIPITOR) 80 MG tablet TAKE 1 TABLET BY MOUTH  DAILY  . Azelastine HCl 137 MCG/SPRAY SOLN Place 2 sprays into the nose daily.  . Budeson-Glycopyrrol-Formoterol (BREZTRI AEROSPHERE) 160-9-4.8 MCG/ACT AERO USE 2 INHALATIONS BY MOUTH  DAILY  . cholecalciferol (VITAMIN D3) 25 MCG (1000 UNIT) tablet Take 1,000 Units by mouth daily.  . clopidogrel (PLAVIX) 75 MG tablet TAKE 1 TABLET BY MOUTH  DAILY  .  diazepam (VALIUM) 5 MG tablet Take 5 mg by mouth as needed for anxiety.  . diclofenac Sodium (VOLTAREN) 1 % GEL Apply topically 4 (four) times daily.  Marland Kitchen donepezil (ARICEPT) 5 MG tablet TAKE 1 TABLET BY MOUTH  DAILY  . gabapentin (NEURONTIN) 100 MG capsule TAKE 1 CAPSULE BY MOUTH 3  TIMES DAILY AS NEEDED  . Ginkgo Biloba 40 MG TABS Take by mouth.  . levocetirizine (XYZAL) 5 MG tablet Take 1 tablet (5 mg total) by mouth every evening.  . magic mouthwash w/lidocaine SOLN Take 5 mLs by mouth 3 (three) times daily. 1 part benadryl, 1 part nystatin and 1 part lidocaine  . meclizine (ANTIVERT) 25 MG tablet TAKE 1 TABLET BY MOUTH 2  TIMES EVERY DAY AS NEEDED  . metFORMIN (GLUCOPHAGE XR) 500 MG 24 hr tablet Take 1 tablet (500 mg total) by mouth daily with breakfast. (Patient not taking: Reported on 05/16/2020)  . olmesartan-hydrochlorothiazide (BENICAR HCT) 40-12.5 MG tablet Take 1 tablet by mouth daily.  Glory Rosebush ULTRA test strip USE WITH METER TO CHECK  BLOOD SUGAR BEFORE  BREAKFAST AND BEFORE DINNER  . OVER THE COUNTER MEDICATION   . Probiotic Product (PROBIOTIC-10 PO) Take by mouth. Take one tablet daily  . Semaglutide (RYBELSUS) 7 MG TABS Take 1 tablet by mouth daily.  . vitamin B-12 (CYANOCOBALAMIN) 100 MCG tablet Take 100 mcg by mouth daily.  . vitamin C (ASCORBIC ACID) 500 MG tablet Take 500 mg by mouth daily.  Marland Kitchen VITAMIN D PO Take 1 tablet by mouth daily.   Facility-Administered Encounter Medications as of 05/25/2020  Medication  .  0.9 %  sodium chloride infusion    Patient Active Problem List   Diagnosis Date Noted  . COPD with asthma ([place]) 02/25/2019  . Peripheral vascular disease ([place]) 02/25/2019  . Chronic bilateral low back pain without sciatica 06/01/2018  . Class 1 obesity due to excess calories without serious comorbidity with body mass index (BMI) of 32.0 to 32.9 in adult 06/01/2018  . Acute left-sided low back pain with left-sided sciatica 05/04/2018  . Shortness of breath  05/04/2018  . Meralgia paraesthetica, left 01/12/2018  . Mild cognitive impairment 10/14/2016  . History of stroke 10/14/2016  . History of syphilis 10/14/2016  . Mixed hyperlipidemia 10/14/2016  . Insomnia 10/14/2016  . Cerebellar stroke syndrome 10/23/2013  . Smoker 02/16/2013  . Chest pain 09/21/2012  . Tobacco abuse disorder 09/21/2012  . Benign paroxysmal positional vertigo 08/26/2012  . CVA (cerebral infarction) 08/25/2012  . Essential hypertension 08/25/2012  . Dyslipidemia 08/25/2012    Conditions to be addressed/monitored:Essential hypertension, Type 2 diabetes mellitus, history of COPD, Stage 3 a Chronic Kidney disease, Mixed Hyperlipidemia    Patient Care Plan: Diabetes Type 2 (Adult)    Problem Identified: Glycemic Management (Diabetes, Type 2)     Long-Range Goal: Glycemic Management Optimized   Start Date: 05/25/2020  Expected End Date: 11/24/2020  This Visit's Progress: On track  Priority: High  Note:   Objective:  Lab Results  Component Value Date   HGBA1C 7.0 (H) 10/19/2019 .   Lab Results  Component Value Date   CREATININE 1.13 (H) 04/18/2020   CREATININE 1.09 (H) 10/19/2019   CREATININE 1.27 (H) 07/21/2019 .   Lab Results  Component Value Date   EGFR 49 (L) 04/18/2020 .   Current Barriers:  Marland Kitchen Knowledge Deficits related to basic Diabetes pathophysiology and self care/management . Knowledge Deficits related to medications used for management of diabetes Case Manager Clinical Goal(s):  . patient will demonstrate improved adherence to prescribed treatment plan for diabetes self care/management as evidenced by: daily monitoring and recording of CBG  adherence to ADA/ carb modified diet exercise 5 days/week adherence to prescribed medication regimen contacting provider for new or worsened symptoms or questions Interventions:  . Collaboration with [name], FNP regarding development and update of comprehensive plan of care as evidenced by provider  attestation and co-signature . Inter-disciplinary care team collaboration (see longitudinal plan of care) . Provided education to patient about basic DM disease process . Review of patient status, including review of consultants reports, relevant laboratory and other test results, and medications completed. . Educated patient on dietary and exercise recommendations; daily glycemic control FBS 80-130, <180 after meals;15'15' rule . Reviewed medications with patient and discussed importance of medication adherence . Provided patient with written educational materials related to hypo and hyperglycemia and importance of correct treatment . Advised patient, providing education and rationale, to check cbg daily before meals and at bedtime and record, calling the PCP and or CCM RN for findings outside established parameters.   . Mailed printed educational materials related to Diabetes Management to patient's home address  . Discussed plans with patient for ongoing care management follow up and provided patient with direct contact information for care management team Self-Care Activities - Self administers oral medications as prescribed Attends all scheduled provider appointments Checks blood sugars as prescribed and utilize hyper and hypoglycemia protocol as needed Adheres to prescribed ADA/carb modified Patient Goals: - check blood sugar at prescribed times - check blood sugar if I feel it is too high  or too low - enter blood sugar readings and medication or insulin into daily log - take the blood sugar log to all doctor visits - take the blood sugar meter to all doctor visits - manage portion size - keep appointment with eye doctor - schedule appointment with eye doctor - check feet daily for cuts, sores or redness - keep feet up while sitting - trim toenails straight across - wash and dry feet carefully every day - wear comfortable, cotton socks - wear comfortable, well-fitting shoes Follow  Up Plan: Telephone follow up appointment with care management team member scheduled for: 07/06/20   Patient Care Plan: Chronic Kidney disease    Problem Identified: Chronic Kidney disease   Priority: High    Long-Range Goal: Chronic Kidney disease - disease progression minimized or prevented   Start Date: 05/25/2020  Expected End Date: 11/24/2020  This Visit's Progress: On track  Priority: High  Note:   Current Barriers:   Ineffective Self Health Maintenance  Clinical Goal(s):  Marland Kitchen Collaboration with [name], FNP regarding development and update of comprehensive plan of care as evidenced by provider attestation and co-signature . Inter-disciplinary care team collaboration (see longitudinal plan of care)  patient will work with care management team to address care coordination and chronic disease management needs related to Disease Management  Educational Needs  Care Coordination  Medication Management and Education  Psychosocial Support   Interventions:   Evaluation of current treatment plan related to CKD Stage III , self-management and patient's adherence to plan as established by provider.  Collaboration with [name], FNP regarding development and update of comprehensive plan of care as evidenced by provider attestation       and co-signature  Inter-disciplinary care team collaboration (see longitudinal plan of care) . Provided education to patient about basic disease process related to Chronic Kidney disease  . Review of patient status, including review of consultants reports, relevant laboratory and other test results, and medications completed. . Reviewed medications with patient and discussed importance of medication adherence . Educated patient on the importance of increasing water to 64 oz daily unless otherwise directed  . Mailed printed educational material related to stages of Chronic Kidney disease, Grocery Shopping with Diabetes and Kidney  disease . Discussed plans with patient for ongoing care management follow up and provided patient with direct contact information for care management team Self Care Activities:  . Continue to adhere to MD recommendations for CKD  . Continue to keep all scheduled follow up appointments . Take medications as directed  . Let your healthcare team know if you are unable to take your medications . Call your pharmacy for refills at least 7 days prior to running out of medication . Increase your water intake unless otherwise directed . Review mailed printed educational materials related to Kidney disease Patient Goals: - maintain adequate kidney function   Follow Up Plan: Telephone follow up appointment with care management team member scheduled for: 07/06/20   Patient Care Plan: Mixed Hyperlipdemia    Problem Identified: Mixed Hyperlipidemia   Priority: High    Long-Range Goal: Mixed Hyperlipidemia - disease progression minimized or prevented   Start Date: 05/25/2020  Expected End Date: 11/24/2020  This Visit's Progress: On track  Priority: High  Note:   Current Barriers:   Ineffective Self Health Maintenance  Clinical Goal(s):  Marland Kitchen Collaboration with [name], FNP regarding development and update of comprehensive plan of care as evidenced by provider attestation and co-signature . Inter-disciplinary  care team collaboration (see longitudinal plan of care)  patient will work with care management team to address care coordination and chronic disease management needs related to Disease Management  Educational Needs  Care Coordination  Medication Management and Education  Psychosocial Support   Interventions:   Evaluation of current treatment plan related to  Mixed Hyperlipidemia  , self-management and patient's adherence to plan as established by provider.  Collaboration with [name], FNP regarding development and update of comprehensive plan of care as evidenced by provider  attestation       and co-signature  Inter-disciplinary care team collaboration (see longitudinal plan of care) . Provided education to patient about basic disease process related to Mixed Hyperlipidemia . Review of patient status, including review of consultants reports, relevant laboratory and other test results, and medications completed. . Reviewed medications with patient and discussed importance of medication adherence . Educated patient on dietary and exercise recommendations  . Mailed printed educational materials related to Hyperlipidemia   Discussed plans with patient for ongoing care management follow up and provided patient with direct contact information for care management team Self Care Activities:   . Continue to keep all scheduled follow up appointments . Take medications as directed  . Let your healthcare team know if you are unable to take your medications . Call your pharmacy for refills at least 7 days prior to running out of medication . Adhere to dietary and exercise recommendations . Review printed educational materials related to Hyperlipidemia  Patient Goals: - lower Cholesterol levels  Follow Up Plan: Telephone follow up appointment with care management team member scheduled for: 07/06/20    Patient Care Plan: COPD (Adult)    Problem Identified: Disease Progression (COPD)   Priority: High    Goal: Disease Progression Minimized or Managed   Start Date: 05/25/2020  Expected End Date: 11/24/2020  This Visit's Progress: On track  Note:   Current Barriers:  Marland Kitchen Knowledge deficits related to basic understanding of COPD disease process . Knowledge deficits related to basic COPD self care/management Case Manager Clinical Goal(s):  patient will verbalize understanding of COPD action plan and when to seek appropriate levels of medical care  patient will verbalize basic understanding of COPD disease process and self care activities  patient will not be  hospitalized for COPD exacerbation as evidenced  Interventions:  . Collaboration with [name], FNP regarding development and update of comprehensive plan of care as evidenced by provider attestation and co-signature . Inter-disciplinary care team collaboration (see longitudinal plan of care) . Provided education to patient about basic disease process related to COPD . Review of patient status, including review of consultants reports, relevant laboratory and other test results, and medications completed. . Determined patient is experiencing more shortness of breath with activity and would like to have this evaluated, she quit smoking more than 20 years ago . Discussed PCP sent referral to [facility] for evaluation and treatment of COPD and shortness of breath  . Reviewed medications with patient and discussed importance of medication adherence . Discussed plans with patient for ongoing care management follow up and provided patient with direct contact information for care management team Self-Care Activities:  Patient verbalizes understanding of plan to contact [facility] for evaluation and treatment of COPD Self administers medications as prescribed Attends all scheduled provider appointments Calls provider office for new concerns or questions Patient Goals: - do exercises in a comfortable position that makes breathing as easy as possible - keep follow-up appointments -  avoid second hand smoke - identify and remove indoor air pollutants - limit outdoor activity during cold weather  Follow Up Plan: Telephone follow up appointment with care management team member scheduled for: 07/06/20     Plan:Telephone follow up appointment with care management team member scheduled for:  07/06/20  Barb Merino, RN, BSN, CCM Care Management Coordinator [facility]  Direct Phone: [phone]

## 2020-05-31 NOTE — Patient Instructions (Addendum)
Goals Addressed    . Chronic Kidney disease - disease progression minimized or prevented   On track    Timeframe:  Long-Range Goal Priority:  High Start Date: 05/25/20                            Expected End Date:  11/18/20  Next Follow up date: 07/06/20       . Continue to adhere to MD recommendations for CKD  . Continue to keep all scheduled follow up appointments . Take medications as directed  . Let your healthcare team know if you are unable to take your medications . Call your pharmacy for refills at least 7 days prior to running out of medication . Increase your water intake unless otherwise directed . Review mailed printed educational materials related to Kidney disease                   . COPD - disease progression minimized or prevented       Timeframe:  Long-Range Goal Priority:  High Start Date:  05/25/20                           Expected End Date: 11/24/20      Next Follow up date: 07/06/20      Self-Care Activities:  Patient verbalizes understanding of plan to contact [facility] for evaluation and treatment of COPD Self administers medications as prescribed Attends all scheduled provider appointments Calls provider office for new concerns or questions Patient Goals: - do exercises in a comfortable position that makes breathing as easy as possible - keep follow-up appointments - avoid second hand smoke - identify and remove indoor air pollutants - limit outdoor activity during cold weather                 . Diabetes - Glycemic Management Optimized   On track    Timeframe:  Long-Range Goal Priority:  High Start Date: 05/25/20                             Expected End Date: 11/24/20  Next Follow up date: 07/06/20        Self-Care Activities - Self administers oral medications as prescribed Attends all scheduled provider appointments Checks blood sugars as prescribed and utilize hyper and hypoglycemia protocol as needed Adheres to prescribed ADA/carb  modified Patient Goals: - check blood sugar at prescribed times - check blood sugar if I feel it is too high or too low - enter blood sugar readings and medication or insulin into daily log - take the blood sugar log to all doctor visits - take the blood sugar meter to all doctor visits - manage portion size     . Diabetes - Obtain Eye Exam   On track    Timeframe:  Long-Range Goal Priority:  Medium Start Date:  05/25/20                          Expected End Date: 11/24/20         Next Follow up date: 07/06/20      Self-Care Activities - Self administers oral medications as prescribed Attends all scheduled provider appointments Checks blood sugars as prescribed and utilize hyper and hypoglycemia protocol as needed Adheres to prescribed ADA/carb modified Patient Goals: - keep appointment  with eye doctor - schedule appointment with eye doctor    . Diabetes - Obtain Foot Exam   On track    Timeframe:  Long-Range Goal Priority:  Medium Start Date:  05/25/20                           Expected End Date: 11/24/20  Next Follow up date: 07/06/20      Self-Care Activities - Self administers oral medications as prescribed Attends all scheduled provider appointments Checks blood sugars as prescribed and utilize hyper and hypoglycemia protocol as needed Adheres to prescribed ADA/carb modified Patient Goals: - check feet daily for cuts, sores or redness - keep feet up while sitting - trim toenails straight across - wash and dry feet carefully every day - wear comfortable, cotton socks - wear comfortable, well-fitting shoes                      . Mixed Hyperlipidemia - disease progression minimized or prevented       Timeframe:  Long-Range Goal Priority:  High Start Date:  05/25/20                           Expected End Date: 11/24/20  Next Follow up date: 07/06/20     Self Care Activities:   . Continue to keep all scheduled follow up appointments . Take medications as directed   . Let your healthcare team know if you are unable to take your medications . Call your pharmacy for refills at least 7 days prior to running out of medication . Adhere to dietary and exercise recommendations  . Review printed educational materials related to Hyperlipidemia  Patient Goals: - lower Cholesterol levels                        Patient Care Plan: Diabetes Type 2 (Adult)    Problem Identified: Glycemic Management (Diabetes, Type 2)     Long-Range Goal: Glycemic Management Optimized   Start Date: 05/25/2020  Expected End Date: 11/24/2020  This Visit's Progress: On track  Priority: High  Note:   Objective:  Lab Results  Component Value Date   HGBA1C 7.0 (H) 10/19/2019 .   Lab Results  Component Value Date   CREATININE 1.13 (H) 04/18/2020   CREATININE 1.09 (H) 10/19/2019   CREATININE 1.27 (H) 07/21/2019 .   Lab Results  Component Value Date   EGFR 49 (L) 04/18/2020 .   Current Barriers:  Marland Kitchen Knowledge Deficits related to basic Diabetes pathophysiology and self care/management . Knowledge Deficits related to medications used for management of diabetes Case Manager Clinical Goal(s):  . patient will demonstrate improved adherence to prescribed treatment plan for diabetes self care/management as evidenced by: daily monitoring and recording of CBG  adherence to ADA/ carb modified diet exercise 5 days/week adherence to prescribed medication regimen contacting provider for new or worsened symptoms or questions Interventions:  . Collaboration with [name], FNP regarding development and update of comprehensive plan of care as evidenced by provider attestation and co-signature . Inter-disciplinary care team collaboration (see longitudinal plan of care) . Provided education to patient about basic DM disease process . Review of patient status, including review of consultants reports, relevant laboratory and other test results, and medications completed. . Educated patient  on dietary and exercise recommendations; daily glycemic control FBS 80-130, <180 after meals;15'15' rule .  Reviewed medications with patient and discussed importance of medication adherence . Provided patient with written educational materials related to hypo and hyperglycemia and importance of correct treatment . Advised patient, providing education and rationale, to check cbg daily before meals and at bedtime and record, calling the PCP and or CCM RN for findings outside established parameters.   . Mailed printed educational materials related to Diabetes Management to patient's home address  . Discussed plans with patient for ongoing care management follow up and provided patient with direct contact information for care management team Self-Care Activities - Self administers oral medications as prescribed Attends all scheduled provider appointments Checks blood sugars as prescribed and utilize hyper and hypoglycemia protocol as needed Adheres to prescribed ADA/carb modified Patient Goals: - check blood sugar at prescribed times - check blood sugar if I feel it is too high or too low - enter blood sugar readings and medication or insulin into daily log - take the blood sugar log to all doctor visits - take the blood sugar meter to all doctor visits - manage portion size - keep appointment with eye doctor - schedule appointment with eye doctor - check feet daily for cuts, sores or redness - keep feet up while sitting - trim toenails straight across - wash and dry feet carefully every day - wear comfortable, cotton socks - wear comfortable, well-fitting shoes Follow Up Plan: Telephone follow up appointment with care management team member scheduled for: 07/06/20   Patient Care Plan: Chronic Kidney disease    Problem Identified: Chronic Kidney disease   Priority: High    Long-Range Goal: Chronic Kidney disease - disease progression minimized or prevented   Start Date: 05/25/2020   Expected End Date: 11/24/2020  This Visit's Progress: On track  Priority: High  Note:   Current Barriers:   Ineffective Self Health Maintenance  Clinical Goal(s):  Marland Kitchen Collaboration with [name], FNP regarding development and update of comprehensive plan of care as evidenced by provider attestation and co-signature . Inter-disciplinary care team collaboration (see longitudinal plan of care)  patient will work with care management team to address care coordination and chronic disease management needs related to Disease Management  Educational Needs  Care Coordination  Medication Management and Education  Psychosocial Support   Interventions:   Evaluation of current treatment plan related to CKD Stage III , self-management and patient's adherence to plan as established by provider.  Collaboration with [name], FNP regarding development and update of comprehensive plan of care as evidenced by provider attestation       and co-signature  Inter-disciplinary care team collaboration (see longitudinal plan of care) . Provided education to patient about basic disease process related to Chronic Kidney disease  . Review of patient status, including review of consultants reports, relevant laboratory and other test results, and medications completed. . Reviewed medications with patient and discussed importance of medication adherence . Educated patient on the importance of increasing water to 64 oz daily unless otherwise directed  . Mailed printed educational material related to stages of Chronic Kidney disease, Grocery Shopping with Diabetes and Kidney disease . Discussed plans with patient for ongoing care management follow up and provided patient with direct contact information for care management team Self Care Activities:  . Continue to adhere to MD recommendations for CKD  . Continue to keep all scheduled follow up appointments . Take medications as directed  . Let your  healthcare team know if you are unable to take your medications .  Call your pharmacy for refills at least 7 days prior to running out of medication . Increase your water intake unless otherwise directed . Review mailed printed educational materials related to Kidney disease Patient Goals: - maintain adequate kidney function   Follow Up Plan: Telephone follow up appointment with care management team member scheduled for: 07/06/20   Patient Care Plan: Mixed Hyperlipdemia    Problem Identified: Mixed Hyperlipidemia   Priority: High    Long-Range Goal: Mixed Hyperlipidemia - disease progression minimized or prevented   Start Date: 05/25/2020  Expected End Date: 11/24/2020  This Visit's Progress: On track  Priority: High  Note:   Current Barriers:   Ineffective Self Health Maintenance  Clinical Goal(s):  Marland Kitchen Collaboration with [name], FNP regarding development and update of comprehensive plan of care as evidenced by provider attestation and co-signature . Inter-disciplinary care team collaboration (see longitudinal plan of care)  patient will work with care management team to address care coordination and chronic disease management needs related to Disease Management  Educational Needs  Care Coordination  Medication Management and Education  Psychosocial Support   Interventions:   Evaluation of current treatment plan related to  Mixed Hyperlipidemia  , self-management and patient's adherence to plan as established by provider.  Collaboration with [name], FNP regarding development and update of comprehensive plan of care as evidenced by provider attestation       and co-signature  Inter-disciplinary care team collaboration (see longitudinal plan of care) . Provided education to patient about basic disease process related to Mixed Hyperlipidemia . Review of patient status, including review of consultants reports, relevant laboratory and other test results, and  medications completed. . Reviewed medications with patient and discussed importance of medication adherence . Educated patient on dietary and exercise recommendations  . Mailed printed educational materials related to Hyperlipidemia   Discussed plans with patient for ongoing care management follow up and provided patient with direct contact information for care management team Self Care Activities:   . Continue to keep all scheduled follow up appointments . Take medications as directed  . Let your healthcare team know if you are unable to take your medications . Call your pharmacy for refills at least 7 days prior to running out of medication . Adhere to dietary and exercise recommendations . Review printed educational materials related to Hyperlipidemia  Patient Goals: - lower Cholesterol levels  Follow Up Plan: Telephone follow up appointment with care management team member scheduled for: 07/06/20

## 2020-06-09 ENCOUNTER — Telehealth: Payer: Medicare Other

## 2020-06-13 ENCOUNTER — Encounter: Payer: Self-pay | Admitting: Nurse Practitioner

## 2020-06-13 ENCOUNTER — Other Ambulatory Visit: Payer: Self-pay

## 2020-06-13 MED ORDER — ONETOUCH ULTRA VI STRP: ORAL_STRIP | 3 refills | Status: DC

## 2020-06-21 ENCOUNTER — Ambulatory Visit (INDEPENDENT_AMBULATORY_CARE_PROVIDER_SITE_OTHER): Payer: Medicare Other

## 2020-06-21 DIAGNOSIS — N1831 Chronic kidney disease, stage 3a: Secondary | ICD-10-CM | POA: Diagnosis not present

## 2020-06-21 DIAGNOSIS — E1169 Type 2 diabetes mellitus with other specified complication: Secondary | ICD-10-CM

## 2020-06-21 DIAGNOSIS — I10 Essential (primary) hypertension: Secondary | ICD-10-CM

## 2020-06-21 DIAGNOSIS — Z8709 Personal history of other diseases of the respiratory system: Secondary | ICD-10-CM

## 2020-06-21 NOTE — Chronic Care Management (AMB) (Signed)
Chronic Care Management    Social Work Note  06/21/2020 Name: Donna Gilmore MRN: [id number] DOB: April 05, 1940  Donna Gilmore is a 79 y.o. year old female who is a primary care patient of [name], [place]. The CCM team was consulted to assist the patient with chronic disease management and/or care coordination needs related to: [employer] .   Engaged with patient by telephone for initial visit in response to provider referral for social work chronic care management and care coordination services.   Consent to Services:  The patient was given the following information about Chronic Care Management services today, agreed to services, and gave verbal consent: 1. CCM service includes personalized support from designated clinical staff supervised by the primary care provider, including individualized plan of care and coordination with other care providers 2. 24/7 contact phone numbers for assistance for urgent and routine care needs. 3. Service will only be billed when office clinical staff spend 20 minutes or more in a month to coordinate care. 4. Only one practitioner may furnish and bill the service in a calendar month. 5.The patient may stop CCM services at any time (effective at the end of the month) by phone call to the office staff. 6. The patient will be responsible for cost sharing (co-pay) of up to 20% of the service fee (after annual deductible is met). Patient agreed to services and consent obtained.  Patient agreed to services and consent obtained.   Assessment: Review of patient past medical history, allergies, medications, and health status, including review of relevant consultants reports was performed today as part of a comprehensive evaluation and provision of chronic care management and care coordination services.     SDOH (Social Determinants of Health) assessments and interventions performed:  SDOH Interventions   Flowsheet Row Most Recent Value  SDOH Interventions    Food Insecurity Interventions Other (Comment), Assist with SNAP Application  [info sent on mobile market]  Housing Interventions Other (Comment)  [mailed info on LIEAP]  Transportation Interventions Intervention Not Indicated       Advanced Directives Status: Not addressed in this encounter.  CCM Care Plan  No Known Allergies  Outpatient Encounter Medications as of 06/21/2020  Medication Sig  . acetaminophen (TYLENOL) 500 MG tablet Take 1 tablet (500 mg total) by mouth every 6 (six) hours as needed.  Marland Kitchen albuterol (PROAIR HFA) 108 (90 Base) MCG/ACT inhaler Inhale 2 puffs into the lungs every 6 (six) hours as needed for wheezing or shortness of breath.  Marland Kitchen atorvastatin (LIPITOR) 80 MG tablet TAKE 1 TABLET BY MOUTH  DAILY  . Azelastine HCl 137 MCG/SPRAY SOLN Place 2 sprays into the nose daily.  . Budeson-Glycopyrrol-Formoterol (BREZTRI AEROSPHERE) 160-9-4.8 MCG/ACT AERO USE 2 INHALATIONS BY MOUTH  DAILY  . cholecalciferol (VITAMIN D3) 25 MCG (1000 UNIT) tablet Take 1,000 Units by mouth daily.  . clopidogrel (PLAVIX) 75 MG tablet TAKE 1 TABLET BY MOUTH  DAILY  . diazepam (VALIUM) 5 MG tablet Take 5 mg by mouth as needed for anxiety.  . diclofenac Sodium (VOLTAREN) 1 % GEL Apply topically 4 (four) times daily.  Marland Kitchen donepezil (ARICEPT) 5 MG tablet TAKE 1 TABLET BY MOUTH  DAILY  . gabapentin (NEURONTIN) 100 MG capsule TAKE 1 CAPSULE BY MOUTH 3  TIMES DAILY AS NEEDED  . Ginkgo Biloba 40 MG TABS Take by mouth.  Marland Kitchen glucose blood (ONETOUCH ULTRA) test strip USE WITH METER TO CHECK  BLOOD SUGAR BEFORE  BREAKFAST AND BEFORE DINNER dx: e11.65  .  glucose blood (ONETOUCH ULTRA) test strip USE WITH METER TO CHECK  BLOOD SUGAR BEFORE  BREAKFAST AND BEFORE DINNER  . levocetirizine (XYZAL) 5 MG tablet Take 1 tablet (5 mg total) by mouth every evening.  . magic mouthwash w/lidocaine SOLN Take 5 mLs by mouth 3 (three) times daily. 1 part benadryl, 1 part nystatin and 1 part lidocaine  . meclizine (ANTIVERT) 25  MG tablet TAKE 1 TABLET BY MOUTH 2  TIMES EVERY DAY AS NEEDED  . metFORMIN (GLUCOPHAGE XR) 500 MG 24 hr tablet Take 1 tablet (500 mg total) by mouth daily with breakfast. (Patient not taking: Reported on 05/16/2020)  . olmesartan-hydrochlorothiazide (BENICAR HCT) 40-12.5 MG tablet Take 1 tablet by mouth daily.  Marland Kitchen OVER THE COUNTER MEDICATION   . Probiotic Product (PROBIOTIC-10 PO) Take by mouth. Take one tablet daily  . Semaglutide (RYBELSUS) 7 MG TABS Take 1 tablet by mouth daily.  . vitamin B-12 (CYANOCOBALAMIN) 100 MCG tablet Take 100 mcg by mouth daily.  . vitamin C (ASCORBIC ACID) 500 MG tablet Take 500 mg by mouth daily.  Marland Kitchen VITAMIN D PO Take 1 tablet by mouth daily.   Facility-Administered Encounter Medications as of 06/21/2020  Medication  . 0.9 %  sodium chloride infusion    Patient Active Problem List   Diagnosis Date Noted  . COPD with asthma ([place]) 02/25/2019  . Peripheral vascular disease ([place]) 02/25/2019  . Chronic bilateral low back pain without sciatica 06/01/2018  . Class 1 obesity due to excess calories without serious comorbidity with body mass index (BMI) of 32.0 to 32.9 in adult 06/01/2018  . Acute left-sided low back pain with left-sided sciatica 05/04/2018  . Shortness of breath 05/04/2018  . Meralgia paraesthetica, left 01/12/2018  . Mild cognitive impairment 10/14/2016  . History of stroke 10/14/2016  . History of syphilis 10/14/2016  . Mixed hyperlipidemia 10/14/2016  . Insomnia 10/14/2016  . Cerebellar stroke syndrome 10/23/2013  . Smoker 02/16/2013  . Chest pain 09/21/2012  . Tobacco abuse disorder 09/21/2012  . Benign paroxysmal positional vertigo 08/26/2012  . CVA (cerebral infarction) 08/25/2012  . Essential hypertension 08/25/2012  . Dyslipidemia 08/25/2012    Conditions to be addressed/monitored: HTN, COPD, DMII and CKD Stage III; Limited access to food, Housing barriers and ADL IADL limitations  Care Plan : Social Work [facility] Plan of Care  Updates  made by Daneen Schick since 06/21/2020 12:00 AM    Problem: Home and Family Safety (Wellness)     Goal: Home and Family Safety Maintained   Start Date: 06/21/2020  Expected End Date: 08/05/2020  This Visit's Progress: On track  Priority: High  Note:   Current Barriers:  . Chronic disease management support and education needs related to HTN, COPD, DM, and CKD Stage III   . Limited knowledge of health plan benefits . Patient experiencing SOB which increases fall risk  Social Worker Clinical Goal(s):  . patient will work with SW to obtain a personal emergency response system under health plan benefit  SW Interventions:  . Inter-disciplinary care team collaboration (see longitudinal plan of care) . Collaboration with [name], FNP regarding development and update of comprehensive plan of care as evidenced by provider attestation and co-signature . Successful outbound call placed to the patient to assess for care coordination needs . Determined patient has [place] coverage . Educated the patient on health plan benefits including transportation and PERS benefit . Determined the patient is interested in obtaining a personal emergency response system under health  plan benefit . Assisted the patient with contacting [facility] to order a mobile necklace with GPS capability . Informed by [place] the patient would receive the device in 2-3 weeks by mail . Scheduled follow up call over the next 45 days  Patient Goals/Self-Care Activities . patient will:   -  Activate PERS once received by mail - Contact SW as needed prior to next scheduled call  Follow Up Plan:  SW will follow up with the patient over the next 45 days    Problem: Healthy Nutrition (Wellness)     Long-Range Goal: Healthy Nutrition Achieved   Start Date: 06/21/2020  Expected End Date: 09/19/2020  Priority: High  Note:   Current Barriers:  . Chronic disease management support and education needs  related to HTN, COPD, DM, and CKD Stage III   . Limited access to food - patient reports concern over ability to afford food. Her son lives with her and assists with some costs  Social Worker Clinical Goal(s):  . patient will work with SW to identify food resources  SW Interventions:  . Inter-disciplinary care team collaboration (see longitudinal plan of care) . Collaboration with [name], FNP regarding development and update of comprehensive plan of care as evidenced by provider attestation and co-signature . Successful outbound call placed to the patient to conduct an SDoH screening . Discussed the patient has difficulty affording food at times . The patients son lives with her and recently graduated college, he has begun working and is assisting with bills as he can . Educated the patient on [employer] [place] mobile fresh market- mailed a schedule of distribution sites to the patients home . Assessed for patient interest in applying for Food and Nutrition benefits - mailed an application to the patients home . Scheduled follow up call over the next 45 days  Patient Goals/Self-Care Activities . patient will:   -  Review mailed resource information - Complete Food and Nutrition Services application  Follow Up Plan:  SW will follow up with the patient over the next 45 days    Problem: Quality of Life (General Plan of Care)     Long-Range Goal: Quality of Life Maintained   Start Date: 06/21/2020  Expected End Date: 10/19/2020  Priority: Medium  Note:   Current Barriers:  . Chronic disease management support and education needs related to HTN, COPD, DM, and CKD Stage III   . Limited ability to afford the cost of heat . Ongoing concerns of allergic reaction when in her own home . Difficulty performing iADL's  Social Worker Clinical Goal(s):  . patient will work with SW to identify and address any acute and/or chronic care coordination needs related to the self health management  of HTN, COPD, DM, and CKD Stage III . Patient will work with SW to become more knowledgeable of resources to assist with utility costs . Patient will work with SW to identify resources for home inspection to rule out mold in the home  . Patient will work with SW to be placed on the [facility] in home aid wait list for assistance with light housekeeping  SW Interventions:  . Inter-disciplinary care team collaboration (see longitudinal plan of care) . Collaboration with [name], FNP regarding development and update of comprehensive plan of care as evidenced by provider attestation and co-signature . Successful outbound call placed to the patient to conduct an SDoH screen . Discussed the patient has difficulty affording heat . Provided verbal and  written education on the [place] (LIEAP) . Patient reports she is not sure if she has mold in her home but she is concerned due to sneezing a lot while in her home . Patient requests assistance with locating a resource to screen for mold . Advised the patient SW would research possible resources and get back to her . Collaboration with Sharol Given with [employer] to determine is a resource is available . Discussed the patient is interested in help with ligh housekeeping in her home . Educated the patient on the [place] in home aid program . Unsuccessful call placed to [name] to place the patient on the wait list . Scheduled follow up call over the next 45 days  Patient Goals/Self-Care Activities . patient will:   -  Review mailed resource on [place] with resources as needed to assess for mold in the home  Follow Up Plan:  SW will follow up with the patient over the next 45 days       Follow Up Plan: SW will follow up with patient by phone over the next 45 days.      Daneen Schick, BSW, CDP Social Worker, Certified Dementia Practitioner [place] / [place]  Management [phone]  Total time spent performing care coordination and/or care management activities with the patient by phone or face to face = 86 minutes.

## 2020-06-21 NOTE — Patient Instructions (Signed)
Social Worker Visit Information  Goals we discussed today:  Goals Addressed            This Visit's Progress   . Healthy Nutrition Achieved       Timeframe:  Long-Range Goal Priority:  High Start Date:   5.25.22                          Expected End Date:  8.23.22                     Next planned outreach date: 6.28.22  Patient Goals/Self-Care Activities . patient will:   - Review mailed resource information - Complete Food and Nutrition Services application    . Home and Family Safety Maintained       Timeframe:  Short-Term Goal Priority:  High Start Date: 5.25.22                             Expected End Date: 7.9.22                    Next planned outreach: 6.28.22  Patient Goals/Self-Care Activities . patient will:   - Activate PERS once received by mail - Contact SW as needed prior to next scheduled call    . Quality of Life Maintained       Timeframe:  Long-Range Goal Priority:  Medium Start Date:  5.25.22                           Expected End Date: 9.22.22                       Next planned outreach: 6.28.22  Patient Goals/Self-Care Activities . patient will:   - Review mailed resource on [place] with resources as needed to assess for mold in the home        Materials Provided: Yes: Verbal and written education on community resources provided  Follow Up Plan: SW will follow up with patient by phone over the next 45 days.   Daneen Schick, BSW, CDP Social Worker, Certified Dementia Practitioner [place] [facility] [place] [facility] [phone]

## 2020-06-22 ENCOUNTER — Ambulatory Visit: Payer: Medicare Other | Admitting: Dietician

## 2020-06-23 ENCOUNTER — Ambulatory Visit: Payer: Medicare Other

## 2020-06-23 ENCOUNTER — Telehealth: Payer: Self-pay

## 2020-06-23 DIAGNOSIS — N1831 Chronic kidney disease, stage 3a: Secondary | ICD-10-CM

## 2020-06-23 DIAGNOSIS — Z8709 Personal history of other diseases of the respiratory system: Secondary | ICD-10-CM

## 2020-06-23 DIAGNOSIS — I10 Essential (primary) hypertension: Secondary | ICD-10-CM

## 2020-06-23 DIAGNOSIS — E1169 Type 2 diabetes mellitus with other specified complication: Secondary | ICD-10-CM

## 2020-06-23 NOTE — Telephone Encounter (Signed)
  Care Management   Follow Up Note   06/23/2020 Name: Donna Gilmore MRN: [id number] DOB: 06-21-1940   Referred by: [name], FNP Reason for referral : Chronic Care Management   SW placed an unsuccessful follow up call to Phillips Hay with the [facility] in home aid program in an attempt to place patient on the wait list. SW left a voice message requesting a return call.  Follow Up Plan: SW will follow up with [name] over the next week.  Daneen Schick, BSW, CDP Social Worker, Certified Dementia Practitioner [place] [facility] [place] [facility] [phone]

## 2020-06-23 NOTE — Chronic Care Management (AMB) (Signed)
Chronic Care Management    Social Work Note  06/23/2020 Name: Donna Gilmore MRN: [id number] DOB: 1940-03-09  Donna Gilmore is a 80 y.o. year old female who is a primary care patient of [name], [place]. The CCM team was consulted to assist the patient with chronic disease management and/or care coordination needs related to: [employer] .   Collaboration with Phillips Hay by phone for referral collaboration in response to provider referral for social work chronic care management and care coordination services.   Consent to Services:  The patient was given information about Chronic Care Management services, agreed to services, and gave verbal consent prior to initiation of services.  Please see initial visit note for detailed documentation.   Patient agreed to services and consent obtained.   Assessment: Review of patient past medical history, allergies, medications, and health status, including review of relevant consultants reports was performed today as part of a comprehensive evaluation and provision of chronic care management and care coordination services.     SDOH (Social Determinants of Health) assessments and interventions performed:    Advanced Directives Status: Not addressed in this encounter.  CCM Care Plan  No Known Allergies  Outpatient Encounter Medications as of 06/23/2020  Medication Sig  . acetaminophen (TYLENOL) 500 MG tablet Take 1 tablet (500 mg total) by mouth every 6 (six) hours as needed.  Marland Kitchen albuterol (PROAIR HFA) 108 (90 Base) MCG/ACT inhaler Inhale 2 puffs into the lungs every 6 (six) hours as needed for wheezing or shortness of breath.  Marland Kitchen atorvastatin (LIPITOR) 80 MG tablet TAKE 1 TABLET BY MOUTH  DAILY  . Azelastine HCl 137 MCG/SPRAY SOLN Place 2 sprays into the nose daily.  . Budeson-Glycopyrrol-Formoterol (BREZTRI AEROSPHERE) 160-9-4.8 MCG/ACT AERO USE 2 INHALATIONS BY MOUTH  DAILY  . cholecalciferol (VITAMIN D3) 25 MCG (1000 UNIT) tablet  Take 1,000 Units by mouth daily.  . clopidogrel (PLAVIX) 75 MG tablet TAKE 1 TABLET BY MOUTH  DAILY  . diazepam (VALIUM) 5 MG tablet Take 5 mg by mouth as needed for anxiety.  . diclofenac Sodium (VOLTAREN) 1 % GEL Apply topically 4 (four) times daily.  Marland Kitchen donepezil (ARICEPT) 5 MG tablet TAKE 1 TABLET BY MOUTH  DAILY  . gabapentin (NEURONTIN) 100 MG capsule TAKE 1 CAPSULE BY MOUTH 3  TIMES DAILY AS NEEDED  . Ginkgo Biloba 40 MG TABS Take by mouth.  Marland Kitchen glucose blood (ONETOUCH ULTRA) test strip USE WITH METER TO CHECK  BLOOD SUGAR BEFORE  BREAKFAST AND BEFORE DINNER dx: e11.65  . glucose blood (ONETOUCH ULTRA) test strip USE WITH METER TO CHECK  BLOOD SUGAR BEFORE  BREAKFAST AND BEFORE DINNER  . levocetirizine (XYZAL) 5 MG tablet Take 1 tablet (5 mg total) by mouth every evening.  . magic mouthwash w/lidocaine SOLN Take 5 mLs by mouth 3 (three) times daily. 1 part benadryl, 1 part nystatin and 1 part lidocaine  . meclizine (ANTIVERT) 25 MG tablet TAKE 1 TABLET BY MOUTH 2  TIMES EVERY DAY AS NEEDED  . metFORMIN (GLUCOPHAGE XR) 500 MG 24 hr tablet Take 1 tablet (500 mg total) by mouth daily with breakfast. (Patient not taking: Reported on 05/16/2020)  . olmesartan-hydrochlorothiazide (BENICAR HCT) 40-12.5 MG tablet Take 1 tablet by mouth daily.  Marland Kitchen OVER THE COUNTER MEDICATION   . Probiotic Product (PROBIOTIC-10 PO) Take by mouth. Take one tablet daily  . Semaglutide (RYBELSUS) 7 MG TABS Take 1 tablet by mouth daily.  . vitamin B-12 (CYANOCOBALAMIN) 100 MCG tablet Take  100 mcg by mouth daily.  . vitamin C (ASCORBIC ACID) 500 MG tablet Take 500 mg by mouth daily.  Marland Kitchen VITAMIN D PO Take 1 tablet by mouth daily.   Facility-Administered Encounter Medications as of 06/23/2020  Medication  . 0.9 %  sodium chloride infusion    Patient Active Problem List   Diagnosis Date Noted  . COPD with asthma ([place]) 02/25/2019  . Peripheral vascular disease ([place]) 02/25/2019  . Chronic bilateral low back pain without  sciatica 06/01/2018  . Class 1 obesity due to excess calories without serious comorbidity with body mass index (BMI) of 32.0 to 32.9 in adult 06/01/2018  . Acute left-sided low back pain with left-sided sciatica 05/04/2018  . Shortness of breath 05/04/2018  . Meralgia paraesthetica, left 01/12/2018  . Mild cognitive impairment 10/14/2016  . History of stroke 10/14/2016  . History of syphilis 10/14/2016  . Mixed hyperlipidemia 10/14/2016  . Insomnia 10/14/2016  . Cerebellar stroke syndrome 10/23/2013  . Smoker 02/16/2013  . Chest pain 09/21/2012  . Tobacco abuse disorder 09/21/2012  . Benign paroxysmal positional vertigo 08/26/2012  . CVA (cerebral infarction) 08/25/2012  . Essential hypertension 08/25/2012  . Dyslipidemia 08/25/2012    Conditions to be addressed/monitored: HTN, COPD, DMII and CKD Stage III; ADL IADL limitations  Care Plan : Social Work [facility] Plan of Care  Updates made by Daneen Schick since 06/23/2020 12:00 AM    Problem: Quality of Life (General Plan of Care)     Long-Range Goal: Quality of Life Maintained   Start Date: 06/21/2020  Expected End Date: 10/19/2020  This Visit's Progress: On track  Priority: Medium  Note:   Current Barriers:  . Chronic disease management support and education needs related to HTN, COPD, DM, and CKD Stage III   . Limited ability to afford the cost of heat . Ongoing concerns of allergic reaction when in her own home . Difficulty performing iADL's  Social Worker Clinical Goal(s):  . patient will work with SW to identify and address any acute and/or chronic care coordination needs related to the self health management of HTN, COPD, DM, and CKD Stage III . Patient will work with SW to become more knowledgeable of resources to assist with utility costs . Patient will work with SW to identify resources for home inspection to rule out mold in the home  . Patient will work with SW to be placed on the [facility] in home aid wait list  for assistance with light housekeeping Goal Met 5.27.22  SW Interventions:  . Inter-disciplinary care team collaboration (see longitudinal plan of care) . Collaboration with [name], FNP regarding development and update of comprehensive plan of care as evidenced by provider attestation and co-signature . Inbound call received from [employer] . Successfully placed patient on [place] waiting list to receive assistance with housekeeping  Patient Goals/Self-Care Activities . patient will:   -  Review mailed resource on [place] with resources as needed to assess for mold in the home  Follow Up Plan:  SW will follow up with the patient over the next 45 days       Follow Up Plan: SW will follow up with patient by phone over the next 45 days as scheduled.      Daneen Schick, BSW, CDP Social Worker, Certified Dementia Practitioner [place] / [place] [facility] [phone]  Total time spent performing care coordination and/or care management activities with the patient by phone or face to  face = 15 minutes.

## 2020-06-27 ENCOUNTER — Ambulatory Visit (INDEPENDENT_AMBULATORY_CARE_PROVIDER_SITE_OTHER): Payer: Medicare Other | Admitting: Nurse Practitioner

## 2020-06-27 ENCOUNTER — Encounter: Payer: Self-pay | Admitting: Nurse Practitioner

## 2020-06-27 ENCOUNTER — Telehealth: Payer: Self-pay

## 2020-06-27 ENCOUNTER — Other Ambulatory Visit: Payer: Self-pay

## 2020-06-27 VITALS — BP 124/80 | HR 78 | Temp 98.9°F | Ht 63.2 in | Wt 188.8 lb

## 2020-06-27 DIAGNOSIS — N183 Chronic kidney disease, stage 3 unspecified: Secondary | ICD-10-CM

## 2020-06-27 DIAGNOSIS — R5383 Other fatigue: Secondary | ICD-10-CM | POA: Diagnosis not present

## 2020-06-27 DIAGNOSIS — R42 Dizziness and giddiness: Secondary | ICD-10-CM

## 2020-06-27 DIAGNOSIS — E1169 Type 2 diabetes mellitus with other specified complication: Secondary | ICD-10-CM | POA: Diagnosis not present

## 2020-06-27 DIAGNOSIS — R413 Other amnesia: Secondary | ICD-10-CM | POA: Diagnosis not present

## 2020-06-27 DIAGNOSIS — I129 Hypertensive chronic kidney disease with stage 1 through stage 4 chronic kidney disease, or unspecified chronic kidney disease: Secondary | ICD-10-CM

## 2020-06-27 DIAGNOSIS — E6609 Other obesity due to excess calories: Secondary | ICD-10-CM

## 2020-06-27 DIAGNOSIS — Z6833 Body mass index (BMI) 33.0-33.9, adult: Secondary | ICD-10-CM

## 2020-06-27 NOTE — Progress Notes (Signed)
I,Yamilka Roman [employer] as a Education administrator for [employer], FNP.,have documented all relevant documentation on the behalf of [name], FNP,as directed by  [name], FNP while in the presence of [name], [place]. This visit occurred during the SARS-CoV-2 public health emergency.  Safety protocols were in place, including screening questions prior to the visit, additional usage of staff PPE, and extensive cleaning of exam room while observing appropriate contact time as indicated for disinfecting solutions.  Subjective:     Patient ID: Donna Gilmore , female    DOB: 02-20-1940 , 80 y.o.   MRN: [id number]   Chief Complaint  Patient presents with   Diabetes   Hypertension    HPI  She is here to follow up on taking her Rybelsus, she has not taken after reading the label insert. She would like to try to get her HgbA1c down on her own. She is seeing a nutritionist.  Wt Readings from Last 3 Encounters: 06/27/20 : 188 lb 12.8 oz (85.6 kg) 05/16/20 : 190 lb (86.2 kg) 04/21/20 : 191 lb (86.6 kg)   Diabetes She presents for her follow-up diabetic visit. She has type 2 diabetes mellitus. Pertinent negatives for hypoglycemia include no dizziness or headaches. Associated symptoms include fatigue. Pertinent negatives for diabetes include no chest pain. Current diabetic treatment includes oral agent (monotherapy). There is no change in her home blood glucose trend.  Hypertension This is a chronic problem. The current episode started more than 1 year ago. The problem is unchanged. The problem is controlled. Pertinent negatives include no chest pain, headaches or palpitations. Risk factors for coronary artery disease include sedentary lifestyle and obesity. Past treatments include angiotensin blockers and diuretics. The current treatment provides significant improvement. There are no compliance problems.  There is no history of angina or kidney disease. Identifiable causes of hypertension include  chronic renal disease (stage 3).    Past Medical History:  Diagnosis Date   Benign paroxysmal positional vertigo 08/26/2012   CVA (cerebral infarction) 08/25/2012   Diabetes mellitus without complication ([place])    Dyslipidemia 08/25/2012   Essential hypertension, benign 08/25/2012   Hyperlipidemia    Hypertension    Meralgia paraesthetica, left 01/12/2018   Stroke [facility])      Family History  Problem Relation Age of Onset   Kidney failure Mother    Diabetes Mother    Alzheimer's disease Mother    Alzheimer's disease Father    Prostate cancer Father    Diabetes Maternal Grandmother    Diabetes Maternal Grandfather    Alzheimer's disease Paternal Grandmother    Alzheimer's disease Paternal Grandfather    Prostate cancer Paternal Uncle      Current Outpatient Medications:    acetaminophen (TYLENOL) 500 MG tablet, Take 1 tablet (500 mg total) by mouth every 6 (six) hours as needed., Disp: 30 tablet, Rfl: 0   albuterol (PROAIR HFA) 108 (90 Base) MCG/ACT inhaler, Inhale 2 puffs into the lungs every 6 (six) hours as needed for wheezing or shortness of breath., Disp: 18 g, Rfl: 2   atorvastatin (LIPITOR) 80 MG tablet, TAKE 1 TABLET BY MOUTH  DAILY, Disp: 90 tablet, Rfl: 3   Azelastine HCl 137 MCG/SPRAY SOLN, Place 2 sprays into the nose daily., Disp: 30 mL, Rfl: 2   Budeson-Glycopyrrol-Formoterol (BREZTRI AEROSPHERE) 160-9-4.8 MCG/ACT AERO, USE 2 INHALATIONS BY MOUTH  DAILY, Disp: 32.1 g, Rfl: 2   cholecalciferol (VITAMIN D3) 25 MCG (1000 UNIT) tablet, Take 1,000 Units by mouth daily., Disp: , Rfl:  clopidogrel (PLAVIX) 75 MG tablet, TAKE 1 TABLET BY MOUTH  DAILY, Disp: 90 tablet, Rfl: 3   diazepam (VALIUM) 5 MG tablet, Take 5 mg by mouth as needed for anxiety., Disp: , Rfl:    diclofenac Sodium (VOLTAREN) 1 % GEL, Apply topically 4 (four) times daily., Disp: , Rfl:    donepezil (ARICEPT) 5 MG tablet, TAKE 1 TABLET BY MOUTH  DAILY, Disp: 90 tablet, Rfl: 3   gabapentin (NEURONTIN) 100 MG  capsule, TAKE 1 CAPSULE BY MOUTH 3  TIMES DAILY AS NEEDED, Disp: 270 capsule, Rfl: 3   Ginkgo Biloba 40 MG TABS, Take by mouth., Disp: , Rfl:    glucose blood (ONETOUCH ULTRA) test strip, USE WITH METER TO CHECK  BLOOD SUGAR BEFORE  BREAKFAST AND BEFORE DINNER dx: e11.65, Disp: 300 strip, Rfl: 3   glucose blood (ONETOUCH ULTRA) test strip, USE WITH METER TO CHECK  BLOOD SUGAR BEFORE  BREAKFAST AND BEFORE DINNER, Disp: 200 strip, Rfl: 3   levocetirizine (XYZAL) 5 MG tablet, Take 1 tablet (5 mg total) by mouth every evening., Disp: 90 tablet, Rfl: 1   magic mouthwash w/lidocaine SOLN, Take 5 mLs by mouth 3 (three) times daily. 1 part benadryl, 1 part nystatin and 1 part lidocaine, Disp: 120 mL, Rfl: 0   meclizine (ANTIVERT) 25 MG tablet, TAKE 1 TABLET BY MOUTH 2  TIMES EVERY DAY AS NEEDED, Disp: 90 tablet, Rfl: 1   olmesartan-hydrochlorothiazide (BENICAR HCT) 40-12.5 MG tablet, Take 1 tablet by mouth daily., Disp: 90 tablet, Rfl: 3   OVER THE COUNTER MEDICATION, , Disp: , Rfl:    Probiotic Product (PROBIOTIC-10 PO), Take by mouth. Take one tablet daily, Disp: , Rfl:    vitamin B-12 (CYANOCOBALAMIN) 100 MCG tablet, Take 100 mcg by mouth daily., Disp: , Rfl:    vitamin C (ASCORBIC ACID) 500 MG tablet, Take 500 mg by mouth daily., Disp: , Rfl:    VITAMIN D PO, Take 1 tablet by mouth daily., Disp: , Rfl:    albuterol (VENTOLIN HFA) 108 (90 Base) MCG/ACT inhaler, Inhale 2 puffs into the lungs every 6 (six) hours as needed for wheezing or shortness of breath., Disp: 8 g, Rfl: 6   metFORMIN (GLUCOPHAGE XR) 500 MG 24 hr tablet, Take 1 tablet (500 mg total) by mouth daily with breakfast., Disp: 90 tablet, Rfl: 1   RYBELSUS 7 MG TABS, TAKE 1 TABLET BY MOUTH  DAILY, Disp: 90 tablet, Rfl: 3   Spacer/Aero-Holding Chambers DEVI, 1 each by Does not apply route. Please dispense spacer. DX: J44.9, Disp: , Rfl:   Current Facility-Administered Medications:    0.9 %  sodium chloride infusion, 500 mL, Intravenous, Once,  [name], MD   No Known Allergies   Review of Systems  Constitutional:  Positive for fatigue.  Respiratory: Negative.  Negative for cough.   Cardiovascular: Negative.  Negative for chest pain, palpitations and leg swelling.  Neurological:  Negative for dizziness and headaches.  Psychiatric/Behavioral: Negative.      Today's Vitals   06/27/20 1540  BP: 124/80  Pulse: 78  Temp: 98.9 F (37.2 C)  Weight: 188 lb 12.8 oz (85.6 kg)  Height: 5' 3.2" (1.605 m)  PainSc: 0-No pain   Body mass index is 33.23 kg/m.   Objective:  Physical Exam Constitutional:      General: She is not in acute distress.    Appearance: Normal appearance. She is obese.  HENT:     Ears:     Comments: She  is not wearing her hearing aids Cardiovascular:     Rate and Rhythm: Normal rate and regular rhythm.     Pulses: Normal pulses.     Heart sounds: Normal heart sounds. No murmur heard. Pulmonary:     Effort: Pulmonary effort is normal. No respiratory distress.     Breath sounds: Normal breath sounds. No wheezing.  Abdominal:     General: Abdomen is flat. Bowel sounds are normal.     Palpations: Abdomen is soft.  Musculoskeletal:        General: Normal range of motion.     Cervical back: Normal range of motion and neck supple.  Skin:    General: Skin is warm and dry.     Capillary Refill: Capillary refill takes less than 2 seconds.  Neurological:     General: No focal deficit present.     Mental Status: She is alert and oriented to person, place, and time.     Cranial Nerves: No cranial nerve deficit.     Motor: No weakness.  Psychiatric:        Mood and Affect: Mood normal.        Behavior: Behavior normal.        Thought Content: Thought content normal.        Judgment: Judgment normal.        Assessment And Plan:     1. Type 2 diabetes mellitus with other specified complication, without long-term current use of insulin (HCC) Chronic, stable She was to start Rybelsus after further  explanation of side effects she is willing to try Rybelsus - Hemoglobin A1c  2. Benign hypertension with chronic kidney disease, stage III (HCC) Chronic, good control Continue with current medications  3. Lightheaded EKG done with SR with HR 67 Encouraged to make sure she is staying well hydrated with water.  She has also been evaluated in the past for persistent dizziness thought to be related to her previous stroke - EKG 12-Lead  4. Class 1 obesity due to excess calories with body mass index (BMI) of 33.0 to 33.9 in adult, unspecified whether serious comorbidity present Chronic, she has lost approximately 2 lbs since her last visit  5. Fatigue, unspecified type Will check vitamin B12 level She complains of fatigue with each visit - Vitamin B12 - EKG 12-Lead     Patient was given opportunity to ask questions. Patient verbalized understanding of the plan and was able to repeat key elements of the plan. All questions were answered to their satisfaction.  [name], FNP   I, [name], FNP, have reviewed all documentation for this visit. The documentation on 06/27/20 for the exam, diagnosis, procedures, and orders are all accurate and complete.   IF YOU HAVE BEEN REFERRED TO A SPECIALIST, IT MAY TAKE 1-2 WEEKS TO SCHEDULE/PROCESS THE REFERRAL. IF YOU HAVE NOT HEARD FROM US/SPECIALIST IN TWO WEEKS, PLEASE GIVE [place] A CALL AT [phone].   THE PATIENT IS ENCOURAGED TO PRACTICE SOCIAL DISTANCING DUE TO THE COVID-19 PANDEMIC.

## 2020-06-27 NOTE — Chronic Care Management (AMB) (Signed)
Called patient for an appointment reminder with Orlando Penner, [place] on 06-28-2020 at 1:00. Instructed patient to have all meds/supplements and logs available for review. Patient voiced understanding.  [place] Pharmacist Assistant [phone]

## 2020-06-28 ENCOUNTER — Ambulatory Visit (INDEPENDENT_AMBULATORY_CARE_PROVIDER_SITE_OTHER): Payer: Medicare Other

## 2020-06-28 ENCOUNTER — Telehealth: Payer: Self-pay

## 2020-06-28 DIAGNOSIS — I10 Essential (primary) hypertension: Secondary | ICD-10-CM

## 2020-06-28 DIAGNOSIS — I739 Peripheral vascular disease, unspecified: Secondary | ICD-10-CM

## 2020-06-28 DIAGNOSIS — E1169 Type 2 diabetes mellitus with other specified complication: Secondary | ICD-10-CM

## 2020-06-28 DIAGNOSIS — N1831 Chronic kidney disease, stage 3a: Secondary | ICD-10-CM

## 2020-06-28 DIAGNOSIS — J449 Chronic obstructive pulmonary disease, unspecified: Secondary | ICD-10-CM

## 2020-06-28 DIAGNOSIS — E785 Hyperlipidemia, unspecified: Secondary | ICD-10-CM

## 2020-06-28 DIAGNOSIS — E782 Mixed hyperlipidemia: Secondary | ICD-10-CM

## 2020-06-28 LAB — VITAMIN B12: Vitamin B-12: 983 pg/mL (ref 232–1245)

## 2020-06-28 LAB — HEMOGLOBIN A1C
Est. average glucose Bld gHb Est-mCnc: 169 mg/dL
Hgb A1c MFr Bld: 7.5 % — ABNORMAL HIGH (ref 4.8–5.6)

## 2020-06-28 NOTE — Progress Notes (Signed)
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 MCG/ ACT -  inhale 2 puffs into the lungs everry 6 hours as needed for wheezing or shortness of breath  Levocetirizine 5 mg - take 1 tablet by mouth every evening Azelastine HCl 137 MCG/SPRAY - place 2 sprays into the nose daily  -Gold Grade: Patient scheduled to have FEV1 completed during next office visit with [facility] -Current COPD Classification:  none at this time  -MMRC/CAT score:will review during next office visit  Lab Results  Component Value Date/Time   EOSPCT 2 09/30/2013 09:20 AM   EOSABS 0.1  09/30/2013 09:20 AM   -Pulmonary function testing: patient to have testing done  -Exacerbations requiring treatment in last 6 months: none  -Patient denies consistent use of maintenance inhaler -Patient reports that she is going to see a pulmonologist.  -Frequency of rescue inhaler use: patient reports that she does not use the rescue inhaler often  -Counseled on Proper inhaler technique; Benefits of consistent maintenance inhaler use When to use rescue inhaler Differences between maintenance and rescue inhalers -Recommended to continue current medication   Merelgia paraesthetica, left  -Controlled -Current treatment  Diazepam 5 mg tablet once per day as needed Gabapentin 100 mg capsule three times daily as needed Donepezil 5 mg take 1 tablet by mouth daily  -Recommended to continue current medication   Patient Goals/Self-Care Activities Patient will:  - take medications as prescribed  Follow Up Plan: The patient has been provided with contact information for the care management team and has been advised to call with any health related questions or concerns.

## 2020-06-28 NOTE — Chronic Care Management (AMB) (Signed)
Chronic Care Management Pharmacy Assistant   Name: Donna Gilmore  MRN: [id number] DOB: Jun 12, 1940   Reason for Encounter: Chart review for CPP on 06-28-2020   Conditions to be addressed/monitored: HTN, HLD, COPD and DMII   Recent office visits:  06-27-2020 [name], [place]  06-23-2020 Daneen Schick (CCM)  06-21-2020 Daneen Schick (CCM)  05-25-2020 Rex Kras [name], [name]  05-16-2020 [name], [place]. START Rybelsus 7 MG daily  04-18-2020 [name], FNP. START Metformin 500 MG daily with breakfast. Referral placed to diabetic education. Referral placed to community care coordination.  01-18-2021 [name], FNP  Recent consult visits:  04-21-2020 [name], RD ([facility])   Hospital visits:  None in previous 6 months  Medications: Outpatient Encounter Medications as of 06/28/2020  Medication Sig  . acetaminophen (TYLENOL) 500 MG tablet Take 1 tablet (500 mg total) by mouth every 6 (six) hours as needed.  Marland Kitchen albuterol (PROAIR HFA) 108 (90 Base) MCG/ACT inhaler Inhale 2 puffs into the lungs every 6 (six) hours as needed for wheezing or shortness of breath.  Marland Kitchen atorvastatin (LIPITOR) 80 MG tablet TAKE 1 TABLET BY MOUTH  DAILY  . Azelastine HCl 137 MCG/SPRAY SOLN Place 2 sprays into the nose daily.  . Budeson-Glycopyrrol-Formoterol (BREZTRI AEROSPHERE) 160-9-4.8 MCG/ACT AERO USE 2 INHALATIONS BY MOUTH  DAILY  . cholecalciferol (VITAMIN D3) 25 MCG (1000 UNIT) tablet Take 1,000 Units by mouth daily.  . clopidogrel (PLAVIX) 75 MG tablet TAKE 1 TABLET BY MOUTH  DAILY  . diazepam (VALIUM) 5 MG tablet Take 5 mg by mouth as needed for anxiety.  . diclofenac Sodium (VOLTAREN) 1 % GEL Apply topically 4 (four) times daily.  Marland Kitchen donepezil (ARICEPT) 5 MG tablet TAKE 1 TABLET BY MOUTH  DAILY  . gabapentin (NEURONTIN) 100 MG capsule TAKE 1 CAPSULE BY MOUTH 3  TIMES DAILY AS NEEDED  . Ginkgo Biloba 40 MG TABS Take by mouth.  Marland Kitchen glucose  blood (ONETOUCH ULTRA) test strip USE WITH METER TO CHECK  BLOOD SUGAR BEFORE  BREAKFAST AND BEFORE DINNER dx: e11.65  . glucose blood (ONETOUCH ULTRA) test strip USE WITH METER TO CHECK  BLOOD SUGAR BEFORE  BREAKFAST AND BEFORE DINNER  . levocetirizine (XYZAL) 5 MG tablet Take 1 tablet (5 mg total) by mouth every evening.  . magic mouthwash w/lidocaine SOLN Take 5 mLs by mouth 3 (three) times daily. 1 part benadryl, 1 part nystatin and 1 part lidocaine  . meclizine (ANTIVERT) 25 MG tablet TAKE 1 TABLET BY MOUTH 2  TIMES EVERY DAY AS NEEDED  . metFORMIN (GLUCOPHAGE XR) 500 MG 24 hr tablet Take 1 tablet (500 mg total) by mouth daily with breakfast. (Patient not taking: No sig reported)  . olmesartan-hydrochlorothiazide (BENICAR HCT) 40-12.5 MG tablet Take 1 tablet by mouth daily.  Marland Kitchen OVER THE COUNTER MEDICATION   . Probiotic Product (PROBIOTIC-10 PO) Take by mouth. Take one tablet daily  . Semaglutide (RYBELSUS) 7 MG TABS Take 1 tablet by mouth daily.  . vitamin B-12 (CYANOCOBALAMIN) 100 MCG tablet Take 100 mcg by mouth daily.  . vitamin C (ASCORBIC ACID) 500 MG tablet Take 500 mg by mouth daily.  Marland Kitchen VITAMIN D PO Take 1 tablet by mouth daily.   Facility-Administered Encounter Medications as of 06/28/2020  Medication  . 0.9 %  sodium chloride infusion   Have you seen any other providers since your last visit? Patient stated no Any changes in your medications or health? Patient stated she  is not taking Metformin. Patient stated she hasn't started Rybelsus due to the side effects she has researched. Patient stated she had an appointment with [name] FNP on 06-27-2020 and plans to start Rybelsus soon.  Any side effects from any medications? Patient stated she itches everywhere and is not sure if it's form any medications. She uses OTC itching cream that helps some.  Do you have an symptoms or problems not managed by your medications? Patient stated she's been taking Meclizine for over 30 years and  it seems to not help with vertigo now.  Any concerns about your health right now? Patient stated she is concerned about her vertigo.  Has your provider asked that you check blood pressure, blood sugar, or follow special diet at home? Patient stated she checks blood sugars and and blood pressure twice daily. Patient stated she goes to a nutritionist to help manage her diet.  Do you get any type of exercise on a regular basis? Patient stated she is not as active anymore do to her rheumatoid arthritis but tries to clean around the house and do light chair exercises.   Can you think of a goal you would like to reach for your health? Patient stated she would like to manage her pain, diabetes and COPD without medications so she can go for daily walks again.  Do you have any problems getting your medications? Patient stated no issues with getting medications but her test strips and Breztri inhaler are too expensive for her.  Is there anything that you would like to discuss during the appointment? Patient stated she is interested in getting her inhaler and test strips at a lower cost. Patient would like to discuss natural options on being healthier since she doesn't like taking medications.  Please bring medications and supplements to appointment  NOTES: Patient stated her dietician recommended increasing her water intake and eating 3 healthy meal daily which is difficult for her. Patient stated she isn't big on breakfast but will eat around 10:00 am which consist of oatmeal/cereal with coffee. Patient stated for lunch she may have fish or baked chicken with raw vegetables and fruit. Patient states she enjoys fresh smoothies daily.Patient stated she has been on sea moss in ginger lemon tea for a month and has seen a difference in her energy level. Patient doesn't understand why she continues to gain weight.   Star Rating Drugs: Atorvastatin 80 MG- Last filled 05-10-2020 [facility] [place] 40-12.5 MG- Last filled 02-29-2020 90 [facility] Metformin 500 MG- Last filled 04-18-2020 90 [facility] [place] (patient reported not taking) Rybelsus 7 MG- No fill History (patient reported not taking)  [facility] Pharmacist Assistant [phone]

## 2020-07-04 ENCOUNTER — Ambulatory Visit (INDEPENDENT_AMBULATORY_CARE_PROVIDER_SITE_OTHER): Payer: Medicare Other

## 2020-07-04 ENCOUNTER — Encounter: Payer: Self-pay | Admitting: Pulmonary Disease

## 2020-07-04 ENCOUNTER — Other Ambulatory Visit: Payer: Self-pay

## 2020-07-04 ENCOUNTER — Ambulatory Visit: Payer: Medicare Other | Admitting: Pulmonary Disease

## 2020-07-04 VITALS — BP 128/80 | HR 65 | Temp 98.0°F | Ht 64.0 in | Wt 185.0 lb

## 2020-07-04 DIAGNOSIS — J449 Chronic obstructive pulmonary disease, unspecified: Secondary | ICD-10-CM

## 2020-07-04 DIAGNOSIS — R0602 Shortness of breath: Secondary | ICD-10-CM

## 2020-07-04 MED ORDER — ALBUTEROL SULFATE HFA 108 (90 BASE) MCG/ACT IN AERS: 2.0000 | INHALATION_SPRAY | Freq: Four times a day (QID) | RESPIRATORY_TRACT | 6 refills | Status: DC | PRN

## 2020-07-04 NOTE — Assessment & Plan Note (Signed)
Her shortness of breath is out of proportion to lung function and I feel deconditioning may be part of the problem here.  Referral to pulmonary rehab program

## 2020-07-04 NOTE — Assessment & Plan Note (Signed)
Interestingly spirometry in 2018 did not show significant airway obstruction.  We will assess for interim changes with another spirometry pre and post. Will also obtain chest x-ray today.  She has not had significant improvement with Judithann Sauger but this may be due to poor drug delivery.  We will provide her a spacer  I think there may also be an element of deconditioning here and I would strongly recommend a pulmonary rehab program she is willing to explore the center-based program. For increased phlegm in the morning I suggested that she take Mucinex in the mornings and using albuterol for quick relief and delay taking the [facility] for another 2 to 3 hours

## 2020-07-04 NOTE — Progress Notes (Signed)
Subjective:    Patient ID: Donna Gilmore, female    DOB: [birth year], 80 y.o.   MRN: [id number]  HPI  80 year old ex-smoker presents to establish care for COPD. She is accompanied by her daughter Olivia Mackie. She smoked half pack per day starting as a teenager until she quit in 2019, about 30 pack years  Her main complaint is that she has increased phlegm in the mornings and she points to her chest and reports congestion.  She also reports difficulty breathing and feels exhausted with low energy especially in the mornings.  She reports that she used to walk in the park before [place] pandemic for up to a mile every day but has been more sedentary during the pandemic.  She is now vaccinated and boosted She denies frequent chest colds.  She reports intermittent wheezing. PCP prescribed Breztri and she does not report significant improvement with this.  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 Benign paroxysmal positional vertigo 08/26/2012  . CVA (cerebral infarction) 08/25/2012  . Diabetes mellitus without complication ([place])   . Dyslipidemia 08/25/2012  . Essential hypertension, benign 08/25/2012  . Hyperlipidemia   . Hypertension   . Meralgia paraesthetica, left 01/12/2018  . Stroke [facility])    History reviewed. No pertinent surgical history.  No Known Allergies  Social History   Socioeconomic History  . Marital status: Divorced    Spouse name: Not on file  . Number of children: 4  . Years of education: Not on file  . Highest education level: Not on file  Occupational History  . Occupation: retired  Tobacco Use  . Smoking status: Former Smoker    Packs/day: 0.30     Years: 30.00    Pack years: 9.00    Types: Cigarettes    Quit date: 2019    Years since quitting: 3.4  . Smokeless tobacco: Never Used  Vaping Use  . Vaping Use: Never used  Substance and Sexual Activity  . Alcohol use: Yes    Comment: occasional  . Drug use: No  . Sexual activity: Not Currently  Other Topics Concern  . Not on file  Social History Narrative  . Not on file   Social Determinants of Health   Financial Resource Strain: Not on file  Food Insecurity: No Food Insecurity  . Worried About Charity fundraiser in the Last Year: Never true  . Ran Out of Food in the Last Year: Never true  Transportation Needs: No Transportation Needs  . Lack of Transportation (Medical): No  . Lack of Transportation (Non-Medical): No  Physical Activity: Not on file  Stress: Not on file  Social Connections: Not on file  Intimate Partner Violence: Not on file     Family History  Problem Relation Age of Onset  . Kidney failure Mother   . Diabetes Mother   . Alzheimer's disease Mother   . Alzheimer's disease Father   . Prostate cancer Father   . Diabetes Maternal Grandmother   . Diabetes Maternal Grandfather   . Alzheimer's disease Paternal Grandmother   . Alzheimer's disease Paternal Grandfather   . Prostate cancer Paternal Uncle      Review of Systems Very hard of hearing Shortness  of breath AM chest congestion   Constitutional: negative for anorexia, fevers and sweats  Eyes: negative for irritation, redness and visual disturbance  Ears, nose, mouth, throat, and face: negative for earaches, epistaxis, nasal congestion and sore throat  Respiratory: negative for  sputum and wheezing  Cardiovascular: negative for chest pain,  lower extremity edema, orthopnea, palpitations and syncope  Gastrointestinal: negative for abdominal pain, constipation, diarrhea, melena, nausea and vomiting  Genitourinary:negative for dysuria, frequency and hematuria  Hematologic/lymphatic: negative  for bleeding, easy bruising and lymphadenopathy  Musculoskeletal:negative for arthralgias, muscle weakness and stiff joints  Neurological: negative for coordination problems, gait problems, headaches and weakness  Endocrine: negative for diabetic symptoms including polydipsia, polyuria and weight loss     Objective:   Physical Exam  Gen. Pleasant, well-nourished, elderly, in no distress, normal affect ENT - no pallor,icterus, no post nasal drip, hard of hearing Neck: No JVD, no thyromegaly, no carotid bruits Lungs: Mild kyphosis, no use of accessory muscles, no dullness to percussion, clear without rales or rhonchi  Cardiovascular: Rhythm regular, heart sounds  normal, no murmurs or gallops, no peripheral edema Abdomen: soft and non-tender, no hepatosplenomegaly, BS normal. Musculoskeletal: No deformities, no cyanosis or clubbing Neuro:  alert, non focal       Assessment & Plan:

## 2020-07-04 NOTE — Patient Instructions (Signed)
CXR today Schedule spiro-pre/post  Rx for spacer For phlegm in the morning, use mucinex 600 mg as needed Rx for albuterol MDI 2 puffs in the morning & as needed Stay on Breztri 2 puffs twice daily  Referral to pulm rehab

## 2020-07-05 ENCOUNTER — Ambulatory Visit: Payer: Medicare Other

## 2020-07-05 ENCOUNTER — Encounter: Payer: Medicare Other | Admitting: Nurse Practitioner

## 2020-07-05 ENCOUNTER — Telehealth: Payer: Self-pay | Admitting: Pulmonary Disease

## 2020-07-05 NOTE — Telephone Encounter (Signed)
disregard

## 2020-07-06 ENCOUNTER — Telehealth: Payer: Medicare Other

## 2020-07-06 ENCOUNTER — Ambulatory Visit: Payer: Self-pay

## 2020-07-06 DIAGNOSIS — Z8709 Personal history of other diseases of the respiratory system: Secondary | ICD-10-CM

## 2020-07-06 DIAGNOSIS — N1831 Chronic kidney disease, stage 3a: Secondary | ICD-10-CM

## 2020-07-06 DIAGNOSIS — E1169 Type 2 diabetes mellitus with other specified complication: Secondary | ICD-10-CM

## 2020-07-06 DIAGNOSIS — E782 Mixed hyperlipidemia: Secondary | ICD-10-CM

## 2020-07-06 NOTE — Chronic Care Management (AMB) (Signed)
Chronic Care Management   CCM RN Visit Note  07/06/2020 Name: [name] MRN: [id number] DOB: 06-Dec-1940  Subjective: Donna Gilmore is a 80 y.o. year old female who is a primary care patient of [name], [place]. The care management team was consulted for assistance with disease management and care coordination needs.    Engaged with patient by telephone for follow up visit in response to provider referral for case management and/or care coordination services.   Consent to Services:  The patient was given information about Chronic Care Management services, agreed to services, and gave verbal consent prior to initiation of services.  Please see initial visit note for detailed documentation.   Patient agreed to services and verbal consent obtained.   Assessment: Review of patient past medical history, allergies, medications, health status, including review of consultants reports, laboratory and other test data, was performed as part of comprehensive evaluation and provision of chronic care management services.   SDOH (Social Determinants of Health) assessments and interventions performed: Yes, no acute needs identified  CCM Care Plan  No Known Allergies  Outpatient Encounter Medications as of 07/06/2020  Medication Sig   acetaminophen (TYLENOL) 500 MG tablet Take 1 tablet (500 mg total) by mouth every 6 (six) hours as needed.   albuterol (PROAIR HFA) 108 (90 Base) MCG/ACT inhaler Inhale 2 puffs into the lungs every 6 (six) hours as needed for wheezing or shortness of breath.   albuterol (VENTOLIN HFA) 108 (90 Base) MCG/ACT inhaler Inhale 2 puffs into the lungs every 6 (six) hours as needed for wheezing or shortness of breath.   atorvastatin (LIPITOR) 80 MG tablet TAKE 1 TABLET BY MOUTH  DAILY   Azelastine HCl 137 MCG/SPRAY SOLN Place 2 sprays into the nose daily.   Budeson-Glycopyrrol-Formoterol (BREZTRI AEROSPHERE) 160-9-4.8 MCG/ACT AERO USE 2 INHALATIONS BY MOUTH  DAILY    cholecalciferol (VITAMIN D3) 25 MCG (1000 UNIT) tablet Take 1,000 Units by mouth daily.   clopidogrel (PLAVIX) 75 MG tablet TAKE 1 TABLET BY MOUTH  DAILY   diazepam (VALIUM) 5 MG tablet Take 5 mg by mouth as needed for anxiety.   diclofenac Sodium (VOLTAREN) 1 % GEL Apply topically 4 (four) times daily.   donepezil (ARICEPT) 5 MG tablet TAKE 1 TABLET BY MOUTH  DAILY   gabapentin (NEURONTIN) 100 MG capsule TAKE 1 CAPSULE BY MOUTH 3  TIMES DAILY AS NEEDED   Ginkgo Biloba 40 MG TABS Take by mouth.   glucose blood (ONETOUCH ULTRA) test strip USE WITH METER TO CHECK  BLOOD SUGAR BEFORE  BREAKFAST AND BEFORE DINNER dx: e11.65   glucose blood (ONETOUCH ULTRA) test strip USE WITH METER TO CHECK  BLOOD SUGAR BEFORE  BREAKFAST AND BEFORE DINNER   levocetirizine (XYZAL) 5 MG tablet Take 1 tablet (5 mg total) by mouth every evening.   magic mouthwash w/lidocaine SOLN Take 5 mLs by mouth 3 (three) times daily. 1 part benadryl, 1 part nystatin and 1 part lidocaine   meclizine (ANTIVERT) 25 MG tablet TAKE 1 TABLET BY MOUTH 2  TIMES EVERY DAY AS NEEDED   metFORMIN (GLUCOPHAGE XR) 500 MG 24 hr tablet Take 1 tablet (500 mg total) by mouth daily with breakfast.   olmesartan-hydrochlorothiazide (BENICAR HCT) 40-12.5 MG tablet Take 1 tablet by mouth daily.   OVER THE COUNTER MEDICATION    Probiotic Product (PROBIOTIC-10 PO) Take by mouth. Take one tablet daily   Semaglutide (RYBELSUS) 7 MG TABS Take 1 tablet by mouth daily.   vitamin B-12 (  CYANOCOBALAMIN) 100 MCG tablet Take 100 mcg by mouth daily.   vitamin C (ASCORBIC ACID) 500 MG tablet Take 500 mg by mouth daily.   VITAMIN D PO Take 1 tablet by mouth daily.   Facility-Administered Encounter Medications as of 07/06/2020  Medication   0.9 %  sodium chloride infusion    Patient Active Problem List   Diagnosis Date Noted   COPD with asthma ([place]) 02/25/2019   Peripheral vascular disease ([place]) 02/25/2019   Chronic bilateral low back pain without sciatica  06/01/2018   Class 1 obesity due to excess calories without serious comorbidity with body mass index (BMI) of 32.0 to 32.9 in adult 06/01/2018   Acute left-sided low back pain with left-sided sciatica 05/04/2018   Shortness of breath 05/04/2018   Meralgia paraesthetica, left 01/12/2018   Mild cognitive impairment 10/14/2016   History of stroke 10/14/2016   History of syphilis 10/14/2016   Mixed hyperlipidemia 10/14/2016   Insomnia 10/14/2016   Cerebellar stroke syndrome 10/23/2013   Smoker 02/16/2013   Chest pain 09/21/2012   Tobacco abuse disorder 09/21/2012   Benign paroxysmal positional vertigo 08/26/2012   CVA (cerebral infarction) 08/25/2012   Essential hypertension 08/25/2012   Dyslipidemia 08/25/2012    Conditions to be addressed/monitored: Essential hypertension, Type 2 diabetes mellitus, history of COPD, Stage 3 a Chronic Kidney disease, Mixed Hyperlipidemia    Care Plan : COPD (Adult)  Updates made by Lynne Logan, RN since 07/06/2020 12:00 AM   Problem: Disease Progression (COPD)   Priority: High     Long-Range Goal: Disease Progression Minimized or Managed   Start Date: 05/25/2020  Expected End Date: 05/25/2021  Recent Progress: On track  Priority: High  Note:   Current Barriers:  Knowledge deficits related to basic understanding of COPD disease process Knowledge deficits related to basic COPD self care/management Case Manager Clinical Goal(s): patient will verbalize understanding of COPD action plan and when to seek appropriate levels of medical care patient will verbalize basic understanding of COPD disease process and self care activities patient will not be hospitalized for COPD exacerbation as evidenced  Interventions:  07/06/20 completed successful call with patient  Collaboration with [name], [place] regarding development and update of comprehensive plan of care as evidenced by provider attestation and co-signature Inter-disciplinary care team  collaboration (see longitudinal plan of care) Determined patient completed her follow with Dr. Elsworth Soho Pulmonologist on 07/04/20 with the following Assessment/Plan reviewed:  Return in about 2 months (around 09/03/2020) for APP. CXR today Schedule spiro-pre/post   Rx for spacer For phlegm in the morning, use mucinex 600 mg as needed Rx for albuterol MDI 2 puffs in the morning & as needed Stay on Breztri 2 puffs twice daily   Referral to pulm rehab     Determined patient verbalizes understanding of her prescribed treatment plan Instructed patient on how to perform pursed lip breathing exercises; Mailed pursed lip breathing exercise tutorial   Reviewed medications with patient and discussed importance of medication adherence; Assessed for barriers Determined patient is having financial difficulty affording her inhalers Pharm D referral sent with routine priority requesting medication assistance with cost of inhalers  Discussed plans with patient for ongoing care management follow up and provided patient with direct contact information for care management team Self-Care Activities:  Patient verbalizes understanding of plan to contact [facility] for evaluation and treatment of COPD Self administers medications as prescribed Attends all scheduled provider appointments Calls provider office for new concerns or questions Patient Goals: -  do exercises in a comfortable position that makes breathing as easy as possible - keep follow-up appointments - avoid second hand smoke - identify and remove indoor air pollutants - limit outdoor activity during cold weather  Follow Up Plan: Telephone follow up appointment with care management team member scheduled for: 09/27/20    Plan:Telephone follow up appointment with care management team member scheduled for:  09/27/20  Barb Merino, RN, BSN, CCM Care Management Coordinator [place] [facility]  Direct Phone:  [phone]

## 2020-07-06 NOTE — Patient Instructions (Signed)
Goals Addressed      COPD - disease progression minimized or prevented       Timeframe:  Long-Range Goal Priority:  High Start Date:  05/25/20                           Expected End Date: 05/25/21     Next Follow up date: 09/27/20      Self-Care Activities:  Patient verbalizes understanding of plan to contact [facility] for evaluation and treatment of COPD Self administers medications as prescribed Attends all scheduled provider appointments Calls provider office for new concerns or questions Patient Goals: - do exercises in a comfortable position that makes breathing as easy as possible - keep follow-up appointments - avoid second hand smoke - identify and remove indoor air pollutants - limit outdoor activity during cold weather

## 2020-07-07 ENCOUNTER — Ambulatory Visit: Payer: Medicare Other

## 2020-07-07 DIAGNOSIS — J449 Chronic obstructive pulmonary disease, unspecified: Secondary | ICD-10-CM

## 2020-07-07 DIAGNOSIS — I10 Essential (primary) hypertension: Secondary | ICD-10-CM

## 2020-07-07 DIAGNOSIS — N1831 Chronic kidney disease, stage 3a: Secondary | ICD-10-CM

## 2020-07-07 DIAGNOSIS — J4489 Other specified chronic obstructive pulmonary disease: Secondary | ICD-10-CM

## 2020-07-07 DIAGNOSIS — E1169 Type 2 diabetes mellitus with other specified complication: Secondary | ICD-10-CM

## 2020-07-07 NOTE — Progress Notes (Signed)
Called and went over xray results per Dr Elsworth Soho with patient. All questions answered and patient expressed full understanding of results. Patient confirmed upcoming scheduled PFT on 07/14/20. Nothing further needed at this time.

## 2020-07-07 NOTE — Patient Instructions (Signed)
Social Worker Visit Information  Goals we discussed today:   Goals Addressed             This Visit's Progress    Healthy Nutrition Achieved       Timeframe:  Long-Range Goal Priority:  High Start Date:   5.25.22                          Expected End Date:  8.23.22                     Next planned outreach date: 6.28.22  Patient Goals/Self-Care Activities patient will:   - Review mailed resource information      COMPLETED: Home and Family Safety Maintained       Timeframe:  Short-Term Goal Priority:  High Start Date: 5.25.22                             Expected End Date: 7.9.22                    Patient Goals/Self-Care Activities patient will:   - Wear PERS device at all time except while device is Programme researcher, broadcasting/film/video Provided: Verbal education about resources provided by phone  Follow Up Plan: SW will follow up with patient by phone over the next month  Daneen Schick, BSW, CDP Social Worker, Certified Dementia Practitioner [facility] [phone]

## 2020-07-07 NOTE — Chronic Care Management (AMB) (Signed)
Chronic Care Management    Social Work Note  07/07/2020 Name: Donna Gilmore MRN: [id number] DOB: 09-30-1940  Donna Gilmore is a 80 y.o. year old female who is a primary care patient of [name], [place]. The CCM team was consulted to assist the patient with chronic disease management and/or care coordination needs related to: [employer] .   Engaged with patient by telephone for follow up visit in response to provider referral for social work chronic care management and care coordination services.   Consent to Services:  The patient was given information about Chronic Care Management services, agreed to services, and gave verbal consent prior to initiation of services.  Please see initial visit note for detailed documentation.   Patient agreed to services and consent obtained.   Assessment: Review of patient past medical history, allergies, medications, and health status, including review of relevant consultants reports was performed today as part of a comprehensive evaluation and provision of chronic care management and care coordination services.     SDOH (Social Determinants of Health) assessments and interventions performed:    Advanced Directives Status: Not addressed in this encounter.  CCM Care Plan  No Known Allergies  Outpatient Encounter Medications as of 07/07/2020  Medication Sig   acetaminophen (TYLENOL) 500 MG tablet Take 1 tablet (500 mg total) by mouth every 6 (six) hours as needed.   albuterol (PROAIR HFA) 108 (90 Base) MCG/ACT inhaler Inhale 2 puffs into the lungs every 6 (six) hours as needed for wheezing or shortness of breath.   albuterol (VENTOLIN HFA) 108 (90 Base) MCG/ACT inhaler Inhale 2 puffs into the lungs every 6 (six) hours as needed for wheezing or shortness of breath.   atorvastatin (LIPITOR) 80 MG tablet TAKE 1 TABLET BY MOUTH  DAILY   Azelastine HCl 137 MCG/SPRAY SOLN Place 2 sprays into the nose daily.   Budeson-Glycopyrrol-Formoterol  (BREZTRI AEROSPHERE) 160-9-4.8 MCG/ACT AERO USE 2 INHALATIONS BY MOUTH  DAILY   cholecalciferol (VITAMIN D3) 25 MCG (1000 UNIT) tablet Take 1,000 Units by mouth daily.   clopidogrel (PLAVIX) 75 MG tablet TAKE 1 TABLET BY MOUTH  DAILY   diazepam (VALIUM) 5 MG tablet Take 5 mg by mouth as needed for anxiety.   diclofenac Sodium (VOLTAREN) 1 % GEL Apply topically 4 (four) times daily.   donepezil (ARICEPT) 5 MG tablet TAKE 1 TABLET BY MOUTH  DAILY   gabapentin (NEURONTIN) 100 MG capsule TAKE 1 CAPSULE BY MOUTH 3  TIMES DAILY AS NEEDED   Ginkgo Biloba 40 MG TABS Take by mouth.   glucose blood (ONETOUCH ULTRA) test strip USE WITH METER TO CHECK  BLOOD SUGAR BEFORE  BREAKFAST AND BEFORE DINNER dx: e11.65   glucose blood (ONETOUCH ULTRA) test strip USE WITH METER TO CHECK  BLOOD SUGAR BEFORE  BREAKFAST AND BEFORE DINNER   levocetirizine (XYZAL) 5 MG tablet Take 1 tablet (5 mg total) by mouth every evening.   magic mouthwash w/lidocaine SOLN Take 5 mLs by mouth 3 (three) times daily. 1 part benadryl, 1 part nystatin and 1 part lidocaine   meclizine (ANTIVERT) 25 MG tablet TAKE 1 TABLET BY MOUTH 2  TIMES EVERY DAY AS NEEDED   metFORMIN (GLUCOPHAGE XR) 500 MG 24 hr tablet Take 1 tablet (500 mg total) by mouth daily with breakfast.   olmesartan-hydrochlorothiazide (BENICAR HCT) 40-12.5 MG tablet Take 1 tablet by mouth daily.   OVER THE COUNTER MEDICATION    Probiotic Product (PROBIOTIC-10 PO) Take by mouth. Take one tablet daily  Semaglutide (RYBELSUS) 7 MG TABS Take 1 tablet by mouth daily.   vitamin B-12 (CYANOCOBALAMIN) 100 MCG tablet Take 100 mcg by mouth daily.   vitamin C (ASCORBIC ACID) 500 MG tablet Take 500 mg by mouth daily.   VITAMIN D PO Take 1 tablet by mouth daily.   Facility-Administered Encounter Medications as of 07/07/2020  Medication   0.9 %  sodium chloride infusion    Patient Active Problem List   Diagnosis Date Noted   COPD with asthma ([place]) 02/25/2019   Peripheral vascular  disease ([place]) 02/25/2019   Chronic bilateral low back pain without sciatica 06/01/2018   Class 1 obesity due to excess calories without serious comorbidity with body mass index (BMI) of 32.0 to 32.9 in adult 06/01/2018   Acute left-sided low back pain with left-sided sciatica 05/04/2018   Shortness of breath 05/04/2018   Meralgia paraesthetica, left 01/12/2018   Mild cognitive impairment 10/14/2016   History of stroke 10/14/2016   History of syphilis 10/14/2016   Mixed hyperlipidemia 10/14/2016   Insomnia 10/14/2016   Cerebellar stroke syndrome 10/23/2013   Smoker 02/16/2013   Chest pain 09/21/2012   Tobacco abuse disorder 09/21/2012   Benign paroxysmal positional vertigo 08/26/2012   CVA (cerebral infarction) 08/25/2012   Essential hypertension 08/25/2012   Dyslipidemia 08/25/2012    Conditions to be addressed/monitored: HTN, COPD, DMII, and CKD Stage III ; Financial constraints related to costs of living and Limited knowledge of health plan benefits  Care Plan : Social Work [facility] Plan of Care  Updates made by Daneen Schick since 07/07/2020 12:00 AM     Problem: Home and Family Safety (Wellness)      Goal: Home and Family Safety Maintained Completed 07/07/2020  Start Date: 06/21/2020  Expected End Date: 08/05/2020  This Visit's Progress: On track  Recent Progress: On track  Priority: High  Note:   Current Barriers:  Chronic disease management support and education needs related to HTN, COPD, DM, and CKD Stage III   Limited knowledge of health plan benefits Patient experiencing SOB which increases fall risk  Social Worker Clinical Goal(s):  patient will work with SW to obtain a personal emergency response system under health plan benefit  SW Interventions:  Inter-disciplinary care team collaboration (see longitudinal plan of care) Collaboration with [name], FNP regarding development and update of comprehensive plan of care as evidenced by provider attestation and  co-signature Successful outbound call placed to the patient to assess goal progression Confirmed patient received Personal Emergency Response System in the mail and is actively wearing it Discussed the patient plans to mail back registration card to change her sisters number as emergency contact from home to cell number Patient is aware how to charge device and has no concerns Goal Met  Patient Goals/Self-Care Activities patient will:   -  Wear PERS device at all times except while device is charging - Contact SW as needed       Problem: Healthy Nutrition (Wellness)      Long-Range Goal: Healthy Nutrition Achieved   Start Date: 06/21/2020  Expected End Date: 09/19/2020  Priority: High  Note:   Current Barriers:  Chronic disease management support and education needs related to HTN, COPD, DM, and CKD Stage III   Limited access to food - patient reports concern over ability to afford food. Her son lives with her and assists with some costs  Social Worker Clinical Goal(s):  patient will work with SW to identify food resources  SW Interventions:  Inter-disciplinary care team collaboration (see longitudinal plan of care) Collaboration with [name], [place] regarding development and update of comprehensive plan of care as evidenced by provider attestation and co-signature Successful outbound call placed to the patient in response to a voice message received Determined the patient did receive mailed resources and had questions regarding Food and Nutrition Services Application Verbally reviewed application with patient by phone Patient indicates she does not feel she will qualify due to her income level Access income eligibility table on DHHS website - patient reports she is over income limit and will discard application Reviewed opportunity to access [employer] market Provided the patient with the June calendar by e-mail  Patient Goals/Self-Care  Activities patient will:   -  Review mailed resource information   Follow Up Plan:  SW will follow up with the patient over the next 45 days       Follow Up Plan: SW will follow up with patient by phone over the next month      Daneen Schick, BSW, CDP Social Worker, Certified Dementia Practitioner [facility] [phone]  Total time spent performing care coordination and/or care management activities with the patient by phone or face to face = 30 minutes.

## 2020-07-10 ENCOUNTER — Telehealth: Payer: Self-pay

## 2020-07-10 NOTE — Chronic Care Management (AMB) (Signed)
Chronic Care Management Pharmacy Assistant   Name: LANASIA PORRAS  MRN: [id number] DOB: [birth year]  Reason for Encounter: Patient Assistance Coordination.  07/10/2020- Patient assistance applications filled out for Rybelsus with [employer] patient assistance program and [place] with [place] patient assistance program. Printing out applications, awaiting patient signature, income documentation, and provider signature. Called patient she is aware if signature needed and income documentation, she will be looking for either W2, pension and/or Social Security statement and will call me back to set up a time to sign paperwork.  07/11/2020- Patient returned my call on 07/10/2020 left voicemail. Returned patient's call today, patient informed me she was able to get her Social Security Income from the social security office and can bring by for patient assistance applications. Scheduled a time with patient to come by PCP office to sign and bring documents. Patient will come 07/18/2020 at 12:00 PM to sign. Orlando Penner, CPP notified.     Medications: Outpatient Encounter Medications as of 07/10/2020  Medication Sig   acetaminophen (TYLENOL) 500 MG tablet Take 1 tablet (500 mg total) by mouth every 6 (six) hours as needed.   albuterol (PROAIR HFA) 108 (90 Base) MCG/ACT inhaler Inhale 2 puffs into the lungs every 6 (six) hours as needed for wheezing or shortness of breath.   albuterol (VENTOLIN HFA) 108 (90 Base) MCG/ACT inhaler Inhale 2 puffs into the lungs every 6 (six) hours as needed for wheezing or shortness of breath.   atorvastatin (LIPITOR) 80 MG tablet TAKE 1 TABLET BY MOUTH  DAILY   Azelastine HCl 137 MCG/SPRAY SOLN Place 2 sprays into the nose daily.   Budeson-Glycopyrrol-Formoterol (BREZTRI AEROSPHERE) 160-9-4.8 MCG/ACT AERO USE 2 INHALATIONS BY MOUTH  DAILY   cholecalciferol (VITAMIN D3) 25 MCG (1000 UNIT) tablet Take 1,000 Units by mouth daily.   clopidogrel (PLAVIX) 75 MG tablet TAKE 1  TABLET BY MOUTH  DAILY   diazepam (VALIUM) 5 MG tablet Take 5 mg by mouth as needed for anxiety.   diclofenac Sodium (VOLTAREN) 1 % GEL Apply topically 4 (four) times daily.   donepezil (ARICEPT) 5 MG tablet TAKE 1 TABLET BY MOUTH  DAILY   gabapentin (NEURONTIN) 100 MG capsule TAKE 1 CAPSULE BY MOUTH 3  TIMES DAILY AS NEEDED   Ginkgo Biloba 40 MG TABS Take by mouth.   glucose blood (ONETOUCH ULTRA) test strip USE WITH METER TO CHECK  BLOOD SUGAR BEFORE  BREAKFAST AND BEFORE DINNER dx: e11.65   glucose blood (ONETOUCH ULTRA) test strip USE WITH METER TO CHECK  BLOOD SUGAR BEFORE  BREAKFAST AND BEFORE DINNER   levocetirizine (XYZAL) 5 MG tablet Take 1 tablet (5 mg total) by mouth every evening.   magic mouthwash w/lidocaine SOLN Take 5 mLs by mouth 3 (three) times daily. 1 part benadryl, 1 part nystatin and 1 part lidocaine   meclizine (ANTIVERT) 25 MG tablet TAKE 1 TABLET BY MOUTH 2  TIMES EVERY DAY AS NEEDED   metFORMIN (GLUCOPHAGE XR) 500 MG 24 hr tablet Take 1 tablet (500 mg total) by mouth daily with breakfast.   olmesartan-hydrochlorothiazide (BENICAR HCT) 40-12.5 MG tablet Take 1 tablet by mouth daily.   OVER THE COUNTER MEDICATION    Probiotic Product (PROBIOTIC-10 PO) Take by mouth. Take one tablet daily   Semaglutide (RYBELSUS) 7 MG TABS Take 1 tablet by mouth daily.   vitamin B-12 (CYANOCOBALAMIN) 100 MCG tablet Take 100 mcg by mouth daily.   vitamin C (ASCORBIC ACID) 500 MG tablet Take 500 mg by  mouth daily.   VITAMIN D PO Take 1 tablet by mouth daily.   Facility-Administered Encounter Medications as of 07/10/2020  Medication   0.9 %  sodium chloride infusion    Star Rating Drugs: Atorvastatin 80 MG- Last filled 05-10-2020 90 [facility] 40-12.5 MG- Last filled 02-29-2020 90 [facility] Metformin 500 MG- Last filled 04-18-2020 90 [facility] [place] (patient reported not taking) Rybelsus 7 MG- No fill History- PAP    SIG: Pattricia Boss, [place]  Pharmacist Assistant [phone]

## 2020-07-11 ENCOUNTER — Other Ambulatory Visit (HOSPITAL_COMMUNITY)
Admission: RE | Admit: 2020-07-11 | Discharge: 2020-07-11 | Disposition: A | Discharge status: Home / Self Care | Payer: Medicare Other | Source: Ambulatory Visit | Attending: Pulmonary Disease | Admitting: Pulmonary Disease

## 2020-07-11 DIAGNOSIS — Z01812 Encounter for preprocedural laboratory examination: Secondary | ICD-10-CM | POA: Diagnosis not present

## 2020-07-11 DIAGNOSIS — Z20822 Contact with and (suspected) exposure to covid-19: Secondary | ICD-10-CM | POA: Diagnosis not present

## 2020-07-11 LAB — SARS CORONAVIRUS 2 (TAT 6-24 HRS): SARS Coronavirus 2: NEGATIVE

## 2020-07-11 NOTE — Patient Instructions (Signed)
Visit Information It was great speaking with you today!  Please let me know if you have any questions about our visit.   Goals Addressed             This Visit's Progress    Manage My Medicine       Timeframe:  Long-Range Goal Priority:  High Start Date:                             Expected End Date:                       Follow Up Date 08/17/2020    - call for medicine refill 2 or 3 days before it runs out - call if I am sick and can't take my medicine - keep a list of all the medicines I take; vitamins and herbals too - use an alarm clock or phone to remind me to take my medicine    Why is this important?   These steps will help you keep on track with your medicines.            Patient Care Plan: CCM Pharmacy Care Plan     Problem Identified: HTN, COPD, PVD, HLD   Priority: High     Goal: Disease Management   This Visit's Progress: On track  Priority: High  Note:     Current Barriers:  Unable to independently monitor therapeutic efficacy  Pharmacist Clinical Goal(s):  Patient will verbalize ability to afford treatment regimen achieve adherence to monitoring guidelines and medication adherence to achieve therapeutic efficacy through collaboration with PharmD and provider.   Interventions: 1:1 collaboration with [name], FNP regarding development and update of comprehensive plan of care as evidenced by provider attestation and co-signature Inter-disciplinary care team collaboration (see longitudinal plan of care) Comprehensive medication review performed; medication list updated in electronic medical record  Hypertension (BP goal <130/80) -Controlled -Current treatment: Olmesartan-Hydrochlorothiazide 40-12.5 mg tablet once per day -Medications previously tried: Hydrochlorothiazide 12.5 mg tablet, Lisinopril 20 mg, Lisinopril 40 mg, Olmesartan 20 mg Olmesartan 40 mg  -Current home readings: She does check her BP readings everyday. This morning BP  112/60, and last evening it was 112/80  -Current dietary habits: will discuss further with patient  -Current exercise habits: please see below  -Denies hypotensive/hypertensive symptoms -Educated on BP goals and benefits of medications for prevention of heart attack, stroke and kidney damage; Daily salt intake goal < 2300 mg; Importance of home blood pressure monitoring; -Counseled to monitor BP at home at least three times per week, document, and provide log at future appointments -Recommended to continue current medication  Hyperlipidemia/Peripheral Vascular Disease: (LDL goal < 70) -Uncontrolled -Current treatment: Atorvastatin 80 mg tablet once per day Olmesartan-hydrochlorothiazide 40 - 12.5 mg tablet once per day  -Current dietary patterns: She rarely eats fried foods  -Current exercise: she reports she get some exercise but not as much as often. She just started exercising on her computer with the chair exercises on Monday. She does 15 minutes in the morning and 15 minutes in the evening. [facility] [place] she stopped going so regularly. -Educated on Cholesterol goals;  Benefits of statin for ASCVD risk reduction; Importance of limiting foods high in cholesterol; -Recommended to continue current medication  Diabetes (A1c goal <8%) -Controlled -Current medications: Rybelsus 7 mg tablet once per day  -Medications previously  tried: none noted at this time  -Current home glucose readings fasting glucose: she does check her sugar every morning this morning 131 -Denies hypoglycemic/hyperglycemic symptoms -Current meal patterns:  breakfast: oatmeal lunch: chicken salad with tomato and piece of bread dinner: piece of fish or chicken, cabbage, and potatoes. She snacks: she enjoys smoothies with kale, spinach and greens with sea moss. She enjoys smoothies. She does not eat bread drinks: water with lemon, sometimes she has a soda, usually Pepsi Zero,  she also has coffee in the morning  -Current exercise: she reports she get some exercise but not as much as often. She just started exercising on her computer with the chair exercises on Monday. She does 15 minutes in the morning and 15 minutes in the evening. [facility] but [place] she stopped going so regularly. -Educated on A1c and blood sugar goals; Exercise goal of 150 minutes per week; Prevention and management of hypoglycemic episodes; Benefits of routine self-monitoring of blood sugar; -Counseled to check feet daily and get yearly eye exams -Recommended to continue current medication  COPD (Goal: control symptoms and prevent exacerbations) -Not ideally controlled -Current treatment  Breztri Aerosphere 160-9-4.8 MCG - use 2 inhalations by mouth twice per day ProAir HFA 108 MCG/ ACT - inhale 2 puffs into the lungs everry 6 hours as needed for wheezing or shortness of breath  Levocetirizine 5 mg - take 1 tablet by mouth every evening Azelastine HCl 137 MCG/SPRAY - place 2 sprays into the nose daily  -Gold Grade: Patient scheduled to have FEV1 completed during next office visit with [facility] -Current COPD Classification:  none at this time  -MMRC/CAT score:will review during next office visit  Lab Results  Component Value Date/Time   EOSPCT 2 09/30/2013 09:20 AM   EOSABS 0.1 09/30/2013 09:20 AM  -Pulmonary function testing: patient to have testing done  -Exacerbations requiring treatment in last 6 months: none  -Patient denies consistent use of maintenance inhaler -Patient reports that she is going to see a pulmonologist.  -Frequency of rescue inhaler use: patient reports that she does not use the rescue inhaler often  -Counseled on Proper inhaler technique; Benefits of consistent maintenance inhaler use When to use rescue inhaler Differences between maintenance and rescue inhalers -Recommended to continue current medication   Merelgia  paraesthetica, left  -Controlled -Current treatment  Diazepam 5 mg tablet once per day as needed Gabapentin 100 mg capsule three times daily as needed Donepezil 5 mg take 1 tablet by mouth daily  -Recommended to continue current medication   Patient Goals/Self-Care Activities Patient will:  - take medications as prescribed  Follow Up Plan: The patient has been provided with contact information for the care management team and has been advised to call with any health related questions or concerns.        Ms. Hilyer was given information about Chronic Care Management services today including:  CCM service includes personalized support from designated clinical staff supervised by her physician, including individualized plan of care and coordination with other care providers 24/7 contact phone numbers for assistance for urgent and routine care needs. Standard insurance, coinsurance, copays and deductibles apply for chronic care management only during months in which we provide at least 20 minutes of these services. Most insurances cover these services at 100%, however patients may be responsible for any copay, coinsurance and/or deductible if applicable. This service may help you avoid the need for more expensive face-to-face services.  Only one practitioner may furnish and bill the service in a calendar month. The patient may stop CCM services at any time (effective at the end of the month) by phone call to the office staff.  Patient agreed to services and verbal consent obtained.   The patient verbalized understanding of instructions, educational materials, and care plan provided today and agreed to receive a mailed copy of patient instructions, educational materials, and care plan.   Orlando Penner, PharmD Clinical Pharmacist [facility] [phone]

## 2020-07-13 ENCOUNTER — Encounter: Payer: Self-pay | Admitting: Dietician

## 2020-07-13 ENCOUNTER — Encounter: Payer: Medicare Other | Attending: Nurse Practitioner | Admitting: Dietician

## 2020-07-13 ENCOUNTER — Other Ambulatory Visit: Payer: Self-pay

## 2020-07-13 DIAGNOSIS — E119 Type 2 diabetes mellitus without complications: Secondary | ICD-10-CM | POA: Diagnosis not present

## 2020-07-13 NOTE — Progress Notes (Signed)
Diabetes Self-Management Education  Visit Type: Follow-up  Appt. Start Time: 1120 Appt. End Time: 1150  07/13/2020  [name], identified by name and date of birth, is a 80 y.o. female with a diagnosis of Diabetes:  .   ASSESSMENT Patient is here today alone.  She was last seen by myself 04/21/2020. She has lost 10 lbs since the last visit. She is drinking 8 cups of water daily. She complains of increased leg weakness and shortness of breath and light headed.  She is followed by pulmonary.  She is concerned that there is something in her house that is causing the SOB. She has not been able to exercise to due the SOB. She has started on Rybelsus with improvement in blood glucose from 112-147 to 99-112.  She is taking this at 6 am and not eating until 11 am.  Instructed her to eat 30 minutes after taking as not eating effects absorption.    History includes Type 2 Diabetes, HTN, HLD, CVA virtigo (Meclazine stopped working well).  Patient is hard of hearing (despite hearing aids) Labs noted to include eGFR 56 10/19/19, cholesterol 328, HDL 51, LDL 197, Triglycerides 391 10/19/19, A1C 7% 10/19/19 Medications include:  Metformin (has not started)   Weight hx: 191 lbs 07/13/2020 Weight 191 lbs 04/21/2020 5'7" Goal:  165 lbs   Patient's oldest son lives with her.  She is retired from [employer].  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 She has an Environmental consultant that helps occasionally.  Her son does a lot of the shopping and she cooks. She states that she cannot exercise due to poor energy and shortness of breath. Silver Sneakers on line most days in the past. Has problems chewing. She enjoys paint by number. She makes wine (likes it sweet) and drinks 4 ounces at times.  Weight 181 lb (82.1 kg). Body mass index is 31.07 kg/m.   Diabetes Self-Management Education - 07/13/20 1213       Visit Information   Visit Type Follow-up      Pre-Education Assessment    Patient understands the diabetes disease and treatment process. Demonstrates understanding / competency    Patient understands incorporating nutritional management into lifestyle. Needs Review    Patient undertands incorporating physical activity into lifestyle. Demonstrates understanding / competency    Patient understands using medications safely. Demonstrates understanding / competency    Patient understands monitoring blood glucose, interpreting and using results Demonstrates understanding / competency    Patient understands prevention, detection, and treatment of acute complications. Demonstrates understanding / competency    Patient understands prevention, detection, and treatment of chronic complications. Demonstrates understanding / competency    Patient understands how to develop strategies to address psychosocial issues. Demonstrates understanding / competency    Patient understands how to develop strategies to promote health/change behavior. Needs Review      Complications   Last HgB A1C per patient/outside source 7.5 %   06/27/2020   How often do you check your blood sugar? 1-2 times/day    Fasting Blood glucose range (mg/dL) 70-129    Postprandial Blood glucose range (mg/dL) 70-129    Number of hypoglycemic episodes per month 0      Dietary Intake   Breakfast oatmeal, Boost, strawberries   11 am   Lunch vegetable smoothie:  kale, spinach, cabbage, other greens, apple or mixed fruit, almond milk, sea moss    Dinner baked chicken or fish and vegetables  Snack (evening) avoids eating after 6 pm    Beverage(s) water, coffee, tea, boost      Exercise   Exercise Type ADL's      Patient Education   Previous Diabetes Education Yes (please comment)   03/2020   Nutrition management  Meal options for control of blood glucose level and chronic complications.    Medications Reviewed patients medication for diabetes, action, purpose, timing of dose and side effects.    Monitoring  Taught/evaluated SMBG meter.    Psychosocial adjustment Identified and addressed patients feelings and concerns about diabetes      Individualized Goals (developed by patient)   Nutrition General guidelines for healthy choices and portions discussed    Medications take my medication as prescribed    Monitoring  test my blood glucose as discussed    Problem Solving timing of rybelsus    Reducing Risk increase portions of healthy fats;examine blood glucose patterns      Patient Self-Evaluation of Goals - Patient rates self as meeting previously set goals (% of time)   Nutrition >75%    Physical Activity >75%    Medications >75%    Monitoring >75%    Problem Solving >75%    Reducing Risk >75%    Health Coping >75%      Post-Education Assessment   Patient understands the diabetes disease and treatment process. Demonstrates understanding / competency    Patient understands incorporating nutritional management into lifestyle. Needs Review    Patient undertands incorporating physical activity into lifestyle. Demonstrates understanding / competency    Patient understands using medications safely. Demonstrates understanding / competency    Patient understands monitoring blood glucose, interpreting and using results Demonstrates understanding / competency    Patient understands prevention, detection, and treatment of acute complications. Demonstrates understanding / competency    Patient understands prevention, detection, and treatment of chronic complications. Demonstrates understanding / competency    Patient understands how to develop strategies to address psychosocial issues. Demonstrates understanding / competency    Patient understands how to develop strategies to promote health/change behavior. Needs Review      Outcomes   Expected Outcomes Demonstrated interest in learning. Expect positive outcomes    Future DMSE 3-4 months    Program Status Not Completed      Subsequent Visit    Since your last visit have you experienced any weight changes? Loss    Weight Loss (lbs) 10             Individualized Plan for Diabetes Self-Management Training:   Learning Objective:  Patient will have a greater understanding of diabetes self-management. Patient education plan is to attend individual and/or group sessions per assessed needs and concerns.   Plan:   Patient Instructions  Be sure to eat 30 minutes after taking the Rybelsus.  Take the Rybelsus with only 4 oz of water each morning.  Be sure that you are eating enough to keep your strength.  Continue to eat an increased amount of greens.  Expected Outcomes:  Demonstrated interest in learning. Expect positive outcomes  Education material provided:   If problems or questions, patient to contact team via:  Phone  Future DSME appointment: 3-4 months

## 2020-07-13 NOTE — Patient Instructions (Addendum)
Be sure to eat 30 minutes after taking the Rybelsus.  Take the Rybelsus with only 4 oz of water each morning.  Be sure that you are eating enough to keep your strength.  Continue to eat an increased amount of greens.

## 2020-07-14 ENCOUNTER — Ambulatory Visit (INDEPENDENT_AMBULATORY_CARE_PROVIDER_SITE_OTHER): Payer: Medicare Other | Admitting: Pulmonary Disease

## 2020-07-14 DIAGNOSIS — J449 Chronic obstructive pulmonary disease, unspecified: Secondary | ICD-10-CM | POA: Diagnosis not present

## 2020-07-14 LAB — PULMONARY FUNCTION TEST
FEF 25-75 Post: 2.85 L/sec
FEF 25-75 Pre: 2.22 L/sec
FEF2575-%Change-Post: 28 %
FEF2575-%Pred-Post: 217 %
FEF2575-%Pred-Pre: 169 %
FEV1-%Change-Post: 5 %
FEV1-%Pred-Post: 122 %
FEV1-%Pred-Pre: 115 %
FEV1-Post: 1.91 L
FEV1-Pre: 1.81 L
FEV1FVC-%Change-Post: 0 %
FEV1FVC-%Pred-Pre: 111 %
FEV6-%Change-Post: 6 %
FEV6-%Pred-Post: 117 %
FEV6-%Pred-Pre: 110 %
FEV6-Post: 2.28 L
FEV6-Pre: 2.14 L
FEV6FVC-%Pred-Post: 104 %
FEV6FVC-%Pred-Pre: 104 %
FVC-%Change-Post: 6 %
FVC-%Pred-Post: 112 %
FVC-%Pred-Pre: 105 %
FVC-Post: 2.28 L
FVC-Pre: 2.14 L
Post FEV1/FVC ratio: 84 %
Post FEV6/FVC ratio: 100 %
Pre FEV1/FVC ratio: 84 %
Pre FEV6/FVC Ratio: 100 %

## 2020-07-14 NOTE — Progress Notes (Signed)
Spirometry pre and post performed today. 

## 2020-07-14 NOTE — Patient Instructions (Signed)
Spirometry pre and post performed today. 

## 2020-07-17 ENCOUNTER — Other Ambulatory Visit: Payer: Self-pay | Admitting: Nurse Practitioner

## 2020-07-17 ENCOUNTER — Encounter (HOSPITAL_COMMUNITY): Payer: Self-pay | Admitting: *Deleted

## 2020-07-17 ENCOUNTER — Encounter: Payer: Self-pay | Admitting: Nurse Practitioner

## 2020-07-17 ENCOUNTER — Telehealth: Payer: Self-pay | Admitting: Pulmonary Disease

## 2020-07-17 DIAGNOSIS — E1169 Type 2 diabetes mellitus with other specified complication: Secondary | ICD-10-CM

## 2020-07-17 DIAGNOSIS — R0602 Shortness of breath: Secondary | ICD-10-CM

## 2020-07-17 NOTE — Progress Notes (Signed)
Received referral from Dr. Elsworth Soho for this pt to participate in pulmonary rehab with the diagnosis of shortness of breath. Clinical review of pt follow up appt on 07/04/20 Pulmonary office note. Also reviewed pt follow up appt with PCP.  Pt with Covid Risk Score - 7. Pt appropriate for scheduling for Pulmonary rehab.  Will forward to support staff for scheduling  when able as there is a wait list and verification of insurance eligibility/benefits with pt consent. Cherre Huger, BSN Cardiac and Training and development officer

## 2020-07-17 NOTE — Progress Notes (Signed)
I spoke with the pt this morning and informed her of these results.

## 2020-07-17 NOTE — Telephone Encounter (Signed)
Called [facility] cardiopulmonary rehab and spoke with Carlette about the previous order that had been placed for pt to begin pulm rehab and stated to her that pt also has SOB. Per Carlette, that diagnosis will work so new order has been placed. Nothing further needed.

## 2020-07-18 ENCOUNTER — Telehealth: Payer: Self-pay

## 2020-07-18 NOTE — Chronic Care Management (AMB) (Signed)
Chronic Care Management Pharmacy Assistant   Name: Donna Gilmore  MRN: [id number] DOB: 1940-06-15  Reason for Encounter: Patient Assistance Coordination- Face to Face Encounter   07/18/2020- Patient arrived to PCP office to fill out patient assistance applications for [employer] for Bretzri and Rybelsus with [employer] patient assistance program. All areas of both applications explained to patient, patient signed and dated, patient also supplied her income information, picture ID and insurance cards. Copies made. All forms were signed by PCP [name], FNP. Applications faxed.  07/25/2020- Patient received letter from [employer] needing additional information, Voicemail was also received by Orlando Penner, CPP from [employer] needing additional information on patient. Called [employer], spoke with Dan Europe, she informed me that patient's insurance card is needed, her Medicare ID number or Social Security number is needed and the quantity on the application prescription needs to be corrected to day supply needed. I was able to obtain the insurance card information, unable to find SS# and will update prescription information. Called patient, she was able to supply me with her SS#, printing out existing application, updating appropriate areas and refaxing. Orlando Penner, CPP notifed.   Medications: Outpatient Encounter Medications as of 07/18/2020  Medication Sig   acetaminophen (TYLENOL) 500 MG tablet Take 1 tablet (500 mg total) by mouth every 6 (six) hours as needed.   albuterol (PROAIR HFA) 108 (90 Base) MCG/ACT inhaler Inhale 2 puffs into the lungs every 6 (six) hours as needed for wheezing or shortness of breath.   albuterol (VENTOLIN HFA) 108 (90 Base) MCG/ACT inhaler Inhale 2 puffs into the lungs every 6 (six) hours as needed for wheezing or shortness of breath.   atorvastatin (LIPITOR) 80 MG tablet TAKE 1 TABLET BY MOUTH  DAILY   Azelastine HCl 137 MCG/SPRAY SOLN Place 2 sprays into the nose daily.    Budeson-Glycopyrrol-Formoterol (BREZTRI AEROSPHERE) 160-9-4.8 MCG/ACT AERO USE 2 INHALATIONS BY MOUTH  DAILY   cholecalciferol (VITAMIN D3) 25 MCG (1000 UNIT) tablet Take 1,000 Units by mouth daily.   clopidogrel (PLAVIX) 75 MG tablet TAKE 1 TABLET BY MOUTH  DAILY   diazepam (VALIUM) 5 MG tablet Take 5 mg by mouth as needed for anxiety.   diclofenac Sodium (VOLTAREN) 1 % GEL Apply topically 4 (four) times daily.   donepezil (ARICEPT) 5 MG tablet TAKE 1 TABLET BY MOUTH  DAILY   gabapentin (NEURONTIN) 100 MG capsule TAKE 1 CAPSULE BY MOUTH 3  TIMES DAILY AS NEEDED   Ginkgo Biloba 40 MG TABS Take by mouth.   glucose blood (ONETOUCH ULTRA) test strip USE WITH METER TO CHECK  BLOOD SUGAR BEFORE  BREAKFAST AND BEFORE DINNER dx: e11.65   glucose blood (ONETOUCH ULTRA) test strip USE WITH METER TO CHECK  BLOOD SUGAR BEFORE  BREAKFAST AND BEFORE DINNER   levocetirizine (XYZAL) 5 MG tablet Take 1 tablet (5 mg total) by mouth every evening.   magic mouthwash w/lidocaine SOLN Take 5 mLs by mouth 3 (three) times daily. 1 part benadryl, 1 part nystatin and 1 part lidocaine   meclizine (ANTIVERT) 25 MG tablet TAKE 1 TABLET BY MOUTH 2  TIMES EVERY DAY AS NEEDED   metFORMIN (GLUCOPHAGE XR) 500 MG 24 hr tablet Take 1 tablet (500 mg total) by mouth daily with breakfast.   olmesartan-hydrochlorothiazide (BENICAR HCT) 40-12.5 MG tablet Take 1 tablet by mouth daily.   OVER THE COUNTER MEDICATION    Probiotic Product (PROBIOTIC-10 PO) Take by mouth. Take one tablet daily   RYBELSUS  7 MG TABS TAKE 1 TABLET BY MOUTH  DAILY   Spacer/Aero-Holding Chambers DEVI 1 each by Does not apply route. Please dispense spacer. DX: J44.9   vitamin B-12 (CYANOCOBALAMIN) 100 MCG tablet Take 100 mcg by mouth daily.   vitamin C (ASCORBIC ACID) 500 MG tablet Take 500 mg by mouth daily.   VITAMIN D PO Take 1 tablet by mouth daily.   Facility-Administered Encounter Medications as of 07/18/2020  Medication   0.9 %  sodium chloride  infusion    Star Rating Drugs: Atorvastatin 80 MG- Last filled 05-10-2020 90 [facility] [place] 40-12.5 MG- Last filled 02-29-2020 90 DS [facility] Metformin 500 MG- Last filled 04-18-2020 90 [facility] [place] (patient reported not taking) Rybelsus 7 MG- No fill History- PAP    SIG: Pattricia Boss, [place] Pharmacist Assistant [phone]

## 2020-07-19 ENCOUNTER — Other Ambulatory Visit: Payer: Self-pay | Admitting: Nurse Practitioner

## 2020-07-19 DIAGNOSIS — E1169 Type 2 diabetes mellitus with other specified complication: Secondary | ICD-10-CM

## 2020-07-24 ENCOUNTER — Telehealth (HOSPITAL_COMMUNITY): Payer: Self-pay

## 2020-07-24 NOTE — Telephone Encounter (Signed)
Pt insurance is active and benefits verified through [facility] Medicare. Co-pay $20.00, DED $0.00/$0.00 met, out of pocket $3.600.00/$45.00 met, co-insurance 0%. No pre-authorization required. Odette F./[employer] Medicare, 07/21/20 @ 3:49PM, [facility]#[id number]   Will contact patient to see if she is interested in the Pulmonary Rehab Program.

## 2020-07-24 NOTE — Telephone Encounter (Signed)
Called patient to see if she is interested in the Pulmonary Rehab Program. Patient expressed interest. Explained scheduling process and went over insurance, patient verbalized understanding. Also adv pt where we are with scheduling for PR and that we have a backlog 1-3 months. 

## 2020-07-25 ENCOUNTER — Ambulatory Visit: Payer: Medicare Other

## 2020-07-25 DIAGNOSIS — N1831 Chronic kidney disease, stage 3a: Secondary | ICD-10-CM

## 2020-07-25 DIAGNOSIS — E1169 Type 2 diabetes mellitus with other specified complication: Secondary | ICD-10-CM | POA: Diagnosis not present

## 2020-07-25 DIAGNOSIS — J449 Chronic obstructive pulmonary disease, unspecified: Secondary | ICD-10-CM | POA: Diagnosis not present

## 2020-07-25 DIAGNOSIS — I10 Essential (primary) hypertension: Secondary | ICD-10-CM

## 2020-07-25 DIAGNOSIS — E785 Hyperlipidemia, unspecified: Secondary | ICD-10-CM | POA: Diagnosis not present

## 2020-07-25 DIAGNOSIS — E782 Mixed hyperlipidemia: Secondary | ICD-10-CM | POA: Diagnosis not present

## 2020-07-25 NOTE — Patient Instructions (Signed)
Social Worker Visit Information  Goals we discussed today:   Goals Addressed             This Visit's Progress    Quality of Life Maintained       Timeframe:  Long-Range Goal Priority:  Medium Start Date:  5.25.22                           Expected End Date: 9.22.22                       Next planned outreach: 9.21.22  Patient Goals/Self-Care Activities patient will:   - Attend Pulmonary rehab when scheduling is available -Contact SW as needed prior to next scheduled call          Materials Provided: Verbal education about limited resources available provided by phone  Follow Up Plan: SW will follow up with patient by phone over the next quarter   Daneen Schick, [place], CDP Social Worker, Certified Dementia Practitioner [place] [facility] [place] [facility] [phone]

## 2020-07-25 NOTE — Chronic Care Management (AMB) (Signed)
Chronic Care Management    Social Work Note  07/25/2020 Name: JODY AGUINAGA MRN: [id number] DOB: 05-09-1940  Peighton Mehra Marini is a 80 y.o. year old female who is a primary care patient of [name], [place]. The CCM team was consulted to assist the patient with chronic disease management and/or care coordination needs related to: [employer] .   Engaged with patient by telephone for follow up visit in response to provider referral for social work chronic care management and care coordination services.   Consent to Services:  The patient was given information about Chronic Care Management services, agreed to services, and gave verbal consent prior to initiation of services.  Please see initial visit note for detailed documentation.   Patient agreed to services and consent obtained.   Assessment: Review of patient past medical history, allergies, medications, and health status, including review of relevant consultants reports was performed today as part of a comprehensive evaluation and provision of chronic care management and care coordination services.     SDOH (Social Determinants of Health) assessments and interventions performed:    Advanced Directives Status: Not addressed in this encounter.  CCM Care Plan  No Known Allergies  Outpatient Encounter Medications as of 07/25/2020  Medication Sig   acetaminophen (TYLENOL) 500 MG tablet Take 1 tablet (500 mg total) by mouth every 6 (six) hours as needed.   albuterol (PROAIR HFA) 108 (90 Base) MCG/ACT inhaler Inhale 2 puffs into the lungs every 6 (six) hours as needed for wheezing or shortness of breath.   albuterol (VENTOLIN HFA) 108 (90 Base) MCG/ACT inhaler Inhale 2 puffs into the lungs every 6 (six) hours as needed for wheezing or shortness of breath.   atorvastatin (LIPITOR) 80 MG tablet TAKE 1 TABLET BY MOUTH  DAILY   Azelastine HCl 137 MCG/SPRAY SOLN Place 2 sprays into the nose daily.   Budeson-Glycopyrrol-Formoterol  (BREZTRI AEROSPHERE) 160-9-4.8 MCG/ACT AERO USE 2 INHALATIONS BY MOUTH  DAILY   cholecalciferol (VITAMIN D3) 25 MCG (1000 UNIT) tablet Take 1,000 Units by mouth daily.   clopidogrel (PLAVIX) 75 MG tablet TAKE 1 TABLET BY MOUTH  DAILY   diazepam (VALIUM) 5 MG tablet Take 5 mg by mouth as needed for anxiety.   diclofenac Sodium (VOLTAREN) 1 % GEL Apply topically 4 (four) times daily.   donepezil (ARICEPT) 5 MG tablet TAKE 1 TABLET BY MOUTH  DAILY   gabapentin (NEURONTIN) 100 MG capsule TAKE 1 CAPSULE BY MOUTH 3  TIMES DAILY AS NEEDED   Ginkgo Biloba 40 MG TABS Take by mouth.   glucose blood (ONETOUCH ULTRA) test strip USE WITH METER TO CHECK  BLOOD SUGAR BEFORE  BREAKFAST AND BEFORE DINNER dx: e11.65   glucose blood (ONETOUCH ULTRA) test strip USE WITH METER TO CHECK  BLOOD SUGAR BEFORE  BREAKFAST AND BEFORE DINNER   levocetirizine (XYZAL) 5 MG tablet Take 1 tablet (5 mg total) by mouth every evening.   magic mouthwash w/lidocaine SOLN Take 5 mLs by mouth 3 (three) times daily. 1 part benadryl, 1 part nystatin and 1 part lidocaine   meclizine (ANTIVERT) 25 MG tablet TAKE 1 TABLET BY MOUTH 2  TIMES EVERY DAY AS NEEDED   metFORMIN (GLUCOPHAGE XR) 500 MG 24 hr tablet Take 1 tablet (500 mg total) by mouth daily with breakfast.   olmesartan-hydrochlorothiazide (BENICAR HCT) 40-12.5 MG tablet Take 1 tablet by mouth daily.   OVER THE COUNTER MEDICATION    Probiotic Product (PROBIOTIC-10 PO) Take by mouth. Take one tablet daily  RYBELSUS 7 MG TABS TAKE 1 TABLET BY MOUTH  DAILY   Spacer/Aero-Holding Chambers DEVI 1 each by Does not apply route. Please dispense spacer. DX: J44.9   vitamin B-12 (CYANOCOBALAMIN) 100 MCG tablet Take 100 mcg by mouth daily.   vitamin C (ASCORBIC ACID) 500 MG tablet Take 500 mg by mouth daily.   VITAMIN D PO Take 1 tablet by mouth daily.   Facility-Administered Encounter Medications as of 07/25/2020  Medication   0.9 %  sodium chloride infusion    Patient Active Problem  List   Diagnosis Date Noted   COPD with asthma ([place]) 02/25/2019   Peripheral vascular disease ([place]) 02/25/2019   Chronic bilateral low back pain without sciatica 06/01/2018   Class 1 obesity due to excess calories without serious comorbidity with body mass index (BMI) of 32.0 to 32.9 in adult 06/01/2018   Acute left-sided low back pain with left-sided sciatica 05/04/2018   Shortness of breath 05/04/2018   Meralgia paraesthetica, left 01/12/2018   Mild cognitive impairment 10/14/2016   History of stroke 10/14/2016   History of syphilis 10/14/2016   Mixed hyperlipidemia 10/14/2016   Insomnia 10/14/2016   Cerebellar stroke syndrome 10/23/2013   Smoker 02/16/2013   Chest pain 09/21/2012   Tobacco abuse disorder 09/21/2012   Benign paroxysmal positional vertigo 08/26/2012   CVA (cerebral infarction) 08/25/2012   Essential hypertension 08/25/2012   Dyslipidemia 08/25/2012    Conditions to be addressed/monitored: HTN, COPD, DMII, and CKD Stage III  Care Plan : Social Work [facility] Plan of Care  Updates made by Daneen Schick since 07/25/2020 12:00 AM     Problem: Home and Family Safety (Wellness) Resolved 07/25/2020     Problem: Quality of Life (General Plan of Care)      Long-Range Goal: Quality of Life Maintained   Start Date: 06/21/2020  Expected End Date: 10/19/2020  Recent Progress: On track  Priority: Medium  Note:   Current Barriers:  Chronic disease management support and education needs related to HTN, COPD, DM, and CKD Stage III   Limited ability to afford the cost of heat Ongoing concerns of allergic reaction when in her own home Difficulty performing iADL's  Social Worker Clinical Goal(s):  patient will work with SW to identify and address any acute and/or chronic care coordination needs related to the self health management of HTN, COPD, DM, and CKD Stage III Patient will work with SW to become more knowledgeable of resources to assist with utility costs Patient will  work with SW to identify resources for home inspection to rule out mold in the home  Patient will work with SW to be placed on the [facility] in home aid wait list for assistance with light housekeeping Goal Met 5.27.22  SW Interventions:  Inter-disciplinary care team collaboration (see longitudinal plan of care) Collaboration with [name], FNP regarding development and update of comprehensive plan of care as evidenced by provider attestation and co-signature Inbound call received from the patient in response to missed call from SW Discussed difficulty with SW locating a resource to help cover the cost of mold inspection Determined the patient was seen by her Pulmonologist 6.17.22 who referred the patient for pulmonary rehab Patient reports she has been contacted by Pulmonary rehab to confirm health plan coverage Patient is awaiting scheduling as there is a wait list Discussed the patient does not have acute SW needs at this time Scheduled follow up call over the next 90 days  Patient Goals/Self-Care Activities patient will:   -  Attend Pulmonary rehab when scheduling is available -Contact SW as needed prior to next scheduled call  Follow Up Plan:  SW will follow up with the patient over the next 90 days       Follow Up Plan: SW will follow up with patient by phone over the next 90 days      Daneen Schick, BSW, CDP Social Worker, Certified Dementia Practitioner [place] [facility] [place] [facility] [phone]

## 2020-07-25 NOTE — Telephone Encounter (Signed)
Erroneous Encounter

## 2020-07-26 ENCOUNTER — Other Ambulatory Visit: Payer: Self-pay

## 2020-07-26 ENCOUNTER — Ambulatory Visit (INDEPENDENT_AMBULATORY_CARE_PROVIDER_SITE_OTHER): Payer: Medicare Other

## 2020-07-26 VITALS — BP 110/62 | HR 71 | Temp 98.2°F | Ht 64.0 in | Wt 180.8 lb

## 2020-07-26 DIAGNOSIS — I10 Essential (primary) hypertension: Secondary | ICD-10-CM | POA: Diagnosis not present

## 2020-07-26 DIAGNOSIS — Z0000 Encounter for general adult medical examination without abnormal findings: Secondary | ICD-10-CM | POA: Diagnosis not present

## 2020-07-26 LAB — POCT URINALYSIS DIPSTICK
Bilirubin, UA: NEGATIVE
Blood, UA: NEGATIVE
Glucose, UA: NEGATIVE
Ketones, UA: NEGATIVE
Nitrite, UA: NEGATIVE
Protein, UA: NEGATIVE
Spec Grav, UA: 1.025 (ref 1.010–1.025)
Urobilinogen, UA: 0.2 E.U./dL
pH, UA: 5.5 (ref 5.0–8.0)

## 2020-07-26 LAB — POCT UA - MICROALBUMIN
Albumin/Creatinine Ratio, Urine, POC: <30
Creatinine, POC: 300 mg/dL
Microalbumin Ur, POC: 30 mg/L

## 2020-07-26 NOTE — Patient Instructions (Signed)
Donna Gilmore , Thank you for taking time to come for your Medicare Wellness Visit. I appreciate your ongoing commitment to your health goals. Please review the following plan we discussed and let me know if I can assist you in the future.   Screening recommendations/referrals: Colonoscopy: not required Mammogram: not required Bone Density: completed 02/14/2015 Recommended yearly ophthalmology/optometry visit for glaucoma screening and checkup Recommended yearly dental visit for hygiene and checkup  Vaccinations: Influenza vaccine: completed 10/19/2019, due 08/28/2020 Pneumococcal vaccine: completed 05/16/2020 Tdap vaccine: decline Shingles vaccine: discussed   Covid-19: 11/23/2019, 03/23/2019, 02/23/2019  Advanced directives: Please bring a copy of your POA (Power of Attorney) and/or Living Will to your next appointment.   Conditions/risks identified: none  Next appointment: Follow up in one year for your annual wellness visit    Preventive Care 65 Years and Older, Female Preventive care refers to lifestyle choices and visits with your health care provider that can promote health and wellness. What does preventive care include? A yearly physical exam. This is also called an annual well check. Dental exams once or twice a year. Routine eye exams. Ask your health care provider how often you should have your eyes checked. Personal lifestyle choices, including: Daily care of your teeth and gums. Regular physical activity. Eating a healthy diet. Avoiding tobacco and drug use. Limiting alcohol use. Practicing safe sex. Taking low-dose aspirin every day. Taking vitamin and mineral supplements as recommended by your health care provider. What happens during an annual well check? The services and screenings done by your health care provider during your annual well check will depend on your age, overall health, lifestyle risk factors, and family history of disease. Counseling  Your health  care provider may ask you questions about your: Alcohol use. Tobacco use. Drug use. Emotional well-being. Home and relationship well-being. Sexual activity. Eating habits. History of falls. Memory and ability to understand (cognition). Work and work Statistician. Reproductive health. Screening  You may have the following tests or measurements: Height, weight, and BMI. Blood pressure. Lipid and cholesterol levels. These may be checked every 5 years, or more frequently if you are over 54 years old. Skin check. Lung cancer screening. You may have this screening every year starting at age 13 if you have a 30-pack-year history of smoking and currently smoke or have quit within the past 15 years. Fecal occult blood test (FOBT) of the stool. You may have this test every year starting at age 20. Flexible sigmoidoscopy or colonoscopy. You may have a sigmoidoscopy every 5 years or a colonoscopy every 10 years starting at age 6. Hepatitis C blood test. Hepatitis B blood test. Sexually transmitted disease (STD) testing. Diabetes screening. This is done by checking your blood sugar (glucose) after you have not eaten for a while (fasting). You may have this done every 1-3 years. Bone density scan. This is done to screen for osteoporosis. You may have this done starting at age 77. Mammogram. This may be done every 1-2 years. Talk to your health care provider about how often you should have regular mammograms. Talk with your health care provider about your test results, treatment options, and if necessary, the need for more tests. Vaccines  Your health care provider may recommend certain vaccines, such as: Influenza vaccine. This is recommended every year. Tetanus, diphtheria, and acellular pertussis (Tdap, Td) vaccine. You may need a Td booster every 10 years. Zoster vaccine. You may need this after age 41. Pneumococcal 13-valent conjugate (PCV13) vaccine. One dose is  recommended after age  76. Pneumococcal polysaccharide (PPSV23) vaccine. One dose is recommended after age 10. Talk to your health care provider about which screenings and vaccines you need and how often you need them. This information is not intended to replace advice given to you by your health care provider. Make sure you discuss any questions you have with your health care provider. Document Released: 02/10/2015 Document Revised: 10/04/2015 Document Reviewed: 11/15/2014 Elsevier Interactive Patient Education  2017 [employer] Prevention in the Home Falls can cause injuries. They can happen to people of all ages. There are many things you can do to make your home safe and to help prevent falls. What can I do on the outside of my home? Regularly fix the edges of walkways and driveways and fix any cracks. Remove anything that might make you trip as you walk through a door, such as a raised step or threshold. Trim any bushes or trees on the path to your home. Use bright outdoor lighting. Clear any walking paths of anything that might make someone trip, such as rocks or tools. Regularly check to see if handrails are loose or broken. Make sure that both sides of any steps have handrails. Any raised decks and porches should have guardrails on the edges. Have any leaves, snow, or ice cleared regularly. Use sand or salt on walking paths during winter. Clean up any spills in your garage right away. This includes oil or grease spills. What can I do in the bathroom? Use night lights. Install grab bars by the toilet and in the tub and shower. Do not use towel bars as grab bars. Use non-skid mats or decals in the tub or shower. If you need to sit down in the shower, use a plastic, non-slip stool. Keep the floor dry. Clean up any water that spills on the floor as soon as it happens. Remove soap buildup in the tub or shower regularly. Attach bath mats securely with double-sided non-slip rug tape. Do not have throw  rugs and other things on the floor that can make you trip. What can I do in the bedroom? Use night lights. Make sure that you have a light by your bed that is easy to reach. Do not use any sheets or blankets that are too big for your bed. They should not hang down onto the floor. Have a firm chair that has side arms. You can use this for support while you get dressed. Do not have throw rugs and other things on the floor that can make you trip. What can I do in the kitchen? Clean up any spills right away. Avoid walking on wet floors. Keep items that you use a lot in easy-to-reach places. If you need to reach something above you, use a strong step stool that has a grab bar. Keep electrical cords out of the way. Do not use floor polish or wax that makes floors slippery. If you must use wax, use non-skid floor wax. Do not have throw rugs and other things on the floor that can make you trip. What can I do with my stairs? Do not leave any items on the stairs. Make sure that there are handrails on both sides of the stairs and use them. Fix handrails that are broken or loose. Make sure that handrails are as long as the stairways. Check any carpeting to make sure that it is firmly attached to the stairs. Fix any carpet that is loose or worn. Avoid having  throw rugs at the top or bottom of the stairs. If you do have throw rugs, attach them to the floor with carpet tape. Make sure that you have a light switch at the top of the stairs and the bottom of the stairs. If you do not have them, ask someone to add them for you. What else can I do to help prevent falls? Wear shoes that: Do not have high heels. Have rubber bottoms. Are comfortable and fit you well. Are closed at the toe. Do not wear sandals. If you use a stepladder: Make sure that it is fully opened. Do not climb a closed stepladder. Make sure that both sides of the stepladder are locked into place. Ask someone to hold it for you, if  possible. Clearly mark and make sure that you can see: Any grab bars or handrails. First and last steps. Where the edge of each step is. Use tools that help you move around (mobility aids) if they are needed. These include: Canes. Walkers. Scooters. Crutches. Turn on the lights when you go into a dark area. Replace any light bulbs as soon as they burn out. Set up your furniture so you have a clear path. Avoid moving your furniture around. If any of your floors are uneven, fix them. If there are any pets around you, be aware of where they are. Review your medicines with your doctor. Some medicines can make you feel dizzy. This can increase your chance of falling. Ask your doctor what other things that you can do to help prevent falls. This information is not intended to replace advice given to you by your health care provider. Make sure you discuss any questions you have with your health care provider. Document Released: 11/10/2008 Document Revised: 06/22/2015 Document Reviewed: 02/18/2014 Elsevier Interactive Patient Education  2017 [employer].

## 2020-07-26 NOTE — Progress Notes (Signed)
This visit occurred during the SARS-CoV-2 public health emergency.  Safety protocols were in place, including screening questions prior to the visit, additional usage of staff PPE, and extensive cleaning of exam room while observing appropriate contact time as indicated for disinfecting solutions.  Subjective:   Donna Gilmore is a 80 y.o. female who presents for Medicare Annual (Subsequent) preventive examination.  Review of Systems     Cardiac Risk Factors include: advanced age (>54men, >86 women);diabetes mellitus;dyslipidemia;hypertension;obesity (BMI >30kg/m2);sedentary lifestyle     Objective:    Today's Vitals   07/26/20 1528 07/26/20 1539  BP: 110/62   Pulse: 71   Temp: 98.2 F (36.8 C)   TempSrc: Oral   Weight: 180 lb 12.8 oz (82 kg)   Height: 5\' 4"  (1.626 m)   PainSc:  5    Body mass index is 31.03 kg/m.  Advanced Directives 07/26/2020 04/21/2020 06/30/2019 06/24/2018 10/22/2017 03/30/2014 11/02/2013  Does Patient Have a Medical Advance Directive? Yes Yes Yes Yes No No Yes  Type of Paramedic of [place];Living will - [place];Living will Living will;Healthcare Power of [place];Living will  Does patient want to make changes to medical advance directive? - - - - - - No - Patient declined  Copy of [place] in Chart? No - copy requested - No - copy requested No - copy requested - - No - copy requested  Pre-existing out of facility DNR order (yellow form or pink MOST form) - - - - - - -    Current Medications (verified) Outpatient Encounter Medications as of 07/26/2020  Medication Sig   acetaminophen (TYLENOL) 500 MG tablet Take 1 tablet (500 mg total) by mouth every 6 (six) hours as needed.   albuterol (PROAIR HFA) 108 (90 Base) MCG/ACT inhaler Inhale 2 puffs into the lungs every 6 (six) hours as needed for wheezing or shortness of breath.   albuterol (VENTOLIN HFA) 108 (90 Base)  MCG/ACT inhaler Inhale 2 puffs into the lungs every 6 (six) hours as needed for wheezing or shortness of breath.   atorvastatin (LIPITOR) 80 MG tablet TAKE 1 TABLET BY MOUTH  DAILY   Azelastine HCl 137 MCG/SPRAY SOLN Place 2 sprays into the nose daily.   Budeson-Glycopyrrol-Formoterol (BREZTRI AEROSPHERE) 160-9-4.8 MCG/ACT AERO USE 2 INHALATIONS BY MOUTH  DAILY   cholecalciferol (VITAMIN D3) 25 MCG (1000 UNIT) tablet Take 1,000 Units by mouth daily.   clopidogrel (PLAVIX) 75 MG tablet TAKE 1 TABLET BY MOUTH  DAILY   diazepam (VALIUM) 5 MG tablet Take 5 mg by mouth as needed for anxiety.   diclofenac Sodium (VOLTAREN) 1 % GEL Apply topically 4 (four) times daily.   donepezil (ARICEPT) 5 MG tablet TAKE 1 TABLET BY MOUTH  DAILY   gabapentin (NEURONTIN) 100 MG capsule TAKE 1 CAPSULE BY MOUTH 3  TIMES DAILY AS NEEDED   Ginkgo Biloba 40 MG TABS Take by mouth.   glucose blood (ONETOUCH ULTRA) test strip USE WITH METER TO CHECK  BLOOD SUGAR BEFORE  BREAKFAST AND BEFORE DINNER dx: e11.65   glucose blood (ONETOUCH ULTRA) test strip USE WITH METER TO CHECK  BLOOD SUGAR BEFORE  BREAKFAST AND BEFORE DINNER   levocetirizine (XYZAL) 5 MG tablet Take 1 tablet (5 mg total) by mouth every evening.   meclizine (ANTIVERT) 25 MG tablet TAKE 1 TABLET BY MOUTH 2  TIMES EVERY DAY AS NEEDED   metFORMIN (GLUCOPHAGE XR) 500 MG 24 hr tablet  Take 1 tablet (500 mg total) by mouth daily with breakfast.   olmesartan-hydrochlorothiazide (BENICAR HCT) 40-12.5 MG tablet Take 1 tablet by mouth daily.   OVER THE COUNTER MEDICATION    Probiotic Product (PROBIOTIC-10 PO) Take by mouth. Take one tablet daily   RYBELSUS 7 MG TABS TAKE 1 TABLET BY MOUTH  DAILY   Spacer/Aero-Holding Chambers DEVI 1 each by Does not apply route. Please dispense spacer. DX: J44.9   vitamin B-12 (CYANOCOBALAMIN) 100 MCG tablet Take 100 mcg by mouth daily.   vitamin C (ASCORBIC ACID) 500 MG tablet Take 500 mg by mouth daily.   VITAMIN D PO Take 1 tablet by  mouth daily.   magic mouthwash w/lidocaine SOLN Take 5 mLs by mouth 3 (three) times daily. 1 part benadryl, 1 part nystatin and 1 part lidocaine (Patient not taking: Reported on 07/26/2020)   Facility-Administered Encounter Medications as of 07/26/2020  Medication   0.9 %  sodium chloride infusion    Allergies (verified) Patient has no known allergies.   History: Past Medical History:  Diagnosis Date   Benign paroxysmal positional vertigo 08/26/2012   CVA (cerebral infarction) 08/25/2012   Diabetes mellitus without complication ([place])    Dyslipidemia 08/25/2012   Essential hypertension, benign 08/25/2012   Hyperlipidemia    Hypertension    Meralgia paraesthetica, left 01/12/2018   Stroke [facility])    History reviewed. No pertinent surgical history. Family History  Problem Relation Age of Onset   Kidney failure Mother    Diabetes Mother    Alzheimer's disease Mother    Alzheimer's disease Father    Prostate cancer Father    Diabetes Maternal Grandmother    Diabetes Maternal Grandfather    Alzheimer's disease Paternal Grandmother    Alzheimer's disease Paternal Grandfather    Prostate cancer Paternal Uncle    Social History   Socioeconomic History   Marital status: Divorced    Spouse name: Not on file   Number of children: 4   Years of education: Not on file   Highest education level: Not on file  Occupational History   Occupation: retired  Tobacco Use   Smoking status: Former    Packs/day: 0.30    Years: 30.00    Pack years: 9.00    Types: Cigarettes    Quit date: 2019    Years since quitting: 3.4   Smokeless tobacco: Never  Vaping Use   Vaping Use: Never used  Substance and Sexual Activity   Alcohol use: Yes    Comment: occasional   Drug use: No   Sexual activity: Not Currently  Other Topics Concern   Not on file  Social History Narrative   Not on file   Social Determinants of Health   Financial Resource Strain: Low Risk    Difficulty of Paying Living  Expenses: Not hard at all  Food Insecurity: No Food Insecurity   Worried About Charity fundraiser in the Last Year: Never true   [place] in the Last Year: Never true  Transportation Needs: No Transportation Needs   Lack of Transportation (Medical): No   Lack of Transportation (Non-Medical): No  Physical Activity: Inactive   Days of Exercise per Week: 0 days   Minutes of Exercise per Session: 0 min  Stress: No Stress Concern Present   Feeling of Stress : Not at all  Social Connections: Not on file    Tobacco Counseling Counseling given: Not Answered   Clinical Intake:  Pre-visit preparation completed:  Yes  Pain : 0-10 Pain Score: 5  Pain Type: Chronic pain Pain Location: Back Pain Orientation: Lower Pain Descriptors / Indicators: Aching, Tightness Pain Onset: More than a month ago Pain Frequency: Intermittent     Nutritional Status: BMI > 30  Obese Nutritional Risks: None Diabetes: Yes  How often do you need to have someone help you when you read instructions, pamphlets, or other written materials from your doctor or pharmacy?: 1 - Never  Diabetic? Yes Nutrition Risk Assessment:  Has the patient had any N/V/D within the last 2 months?  No  Does the patient have any non-healing wounds?  No  Has the patient had any unintentional weight loss or weight gain?  Yes   Diabetes:  Is the patient diabetic?  Yes  If diabetic, was a CBG obtained today?  No  Did the patient bring in their glucometer from home?  No  How often do you monitor your CBG's? Twice daily.   Financial Strains and Diabetes Management:  Are you having any financial strains with the device, your supplies or your medication? No .  Does the patient want to be seen by Chronic Care Management for management of their diabetes?  No  Would the patient like to be referred to a Nutritionist or for Diabetic Management?  No   Diabetic Exams:  Diabetic Eye Exam: Completed 09/01/2019 Diabetic Foot  Exam: Completed 05/26/2020   Interpreter Needed?: No  Information entered by :: NAllen LPN   Activities of Daily Living In your present state of health, do you have any difficulty performing the following activities: 07/26/2020  Hearing? Y  Comment wears hearing aides  Vision? N  Difficulty concentrating or making decisions? Y  Walking or climbing stairs? Y  Dressing or bathing? N  Doing errands, shopping? N  Preparing Food and eating ? N  Using the Toilet? N  In the past six months, have you accidently leaked urine? Y  Do you have problems with loss of bowel control? N  Managing your Medications? N  Managing your Finances? N  Housekeeping or managing your Housekeeping? N  Some recent data might be hidden    Patient Care Team: [name], FNP as PCP - General ([place]) [name], FNP (General Practice) Little, [name], RN as [place] as Social Worker  Indicate any recent [employer] you may have received from other than [facility] providers in the past year (date may be approximate).     Assessment:   This is a routine wellness examination for [place].  Hearing/Vision screen No results found.  Dietary issues and exercise activities discussed: Current Exercise Habits: The patient does not participate in regular exercise at present   Goals Addressed             This Visit's Progress    Patient Stated       07/26/2020, lose belly fat and wants to go back to exercising        Depression Screen PHQ 2/9 Scores 07/26/2020 04/21/2020 06/30/2019 02/25/2019 10/26/2018 09/15/2018 06/24/2018  PHQ - 2 Score 0 0 0 0 0 0 3  PHQ- 9 Score - - - - - - 12    Fall Risk Fall Risk  07/26/2020 04/21/2020 06/30/2019 02/25/2019 10/26/2018  Falls in the past year? 0 0 0 0 0  Number falls in past yr: - - - - -  Comment - - - - -  Injury with Fall? - - - - -  Risk for fall due to : Medication side effect - Medication side effect - -   Follow up Falls evaluation completed;Education provided;Falls prevention discussed - Falls evaluation completed;Education provided;Falls prevention discussed - -    FALL RISK PREVENTION PERTAINING TO THE HOME:  Any stairs in or around the home? No  If so, are there any without handrails? N/a Home free of loose throw rugs in walkways, pet beds, electrical cords, etc? Yes  Adequate lighting in your home to reduce risk of falls? Yes   ASSISTIVE DEVICES UTILIZED TO PREVENT FALLS:  Life alert? No  Use of a cane, walker or w/c? No  Grab bars in the bathroom? No  Shower chair or bench in shower? No  Elevated toilet seat or a handicapped toilet? Yes   TIMED UP AND GO:  Was the test performed? No .    Gait slow and steady without use of assistive device  Cognitive Function:     6CIT Screen 07/26/2020 06/30/2019 06/24/2018  What Year? 0 points 0 points 0 points  What month? 0 points 0 points 0 points  What time? 0 points 0 points 0 points  Count back from 20 0 points 0 points 0 points  Months in reverse 2 points 0 points 0 points  Repeat phrase 4 points 0 points 0 points  Total Score 6 0 0    Immunizations Immunization History  Administered Date(s) Administered   Fluad Quad(high Dose 65+) 10/19/2019   Influenza, High Dose Seasonal PF 11/05/2017   Influenza-Unspecified 10/06/2018   Moderna Sars-Covid-2 Vaccination 02/23/2019, 03/23/2019, 11/23/2019   Pneumococcal Conjugate-13 07/07/2018   Pneumococcal Polysaccharide-23 05/16/2020    TDAP status: Due, Education has been provided regarding the importance of this vaccine. Advised may receive this vaccine at local pharmacy or Health Dept. Aware to provide a copy of the vaccination record if obtained from local pharmacy or Health Dept. Verbalized acceptance and understanding.  Flu Vaccine status: Up to date  Pneumococcal vaccine status: Up to date  Covid-19 vaccine status: Completed vaccines  Qualifies for Shingles Vaccine? Yes    Zostavax completed No   Shingrix Completed?: No.    Education has been provided regarding the importance of this vaccine. Patient has been advised to call insurance company to determine out of pocket expense if they have not yet received this vaccine. Advised may also receive vaccine at local pharmacy or Health Dept. Verbalized acceptance and understanding.  Screening Tests Health Maintenance  Topic Date Due   TETANUS/TDAP  Never done   Zoster Vaccines- Shingrix (1 of 2) Never done   COVID-19 Vaccine (4 - Booster for Moderna series) 03/25/2020   INFLUENZA VACCINE  08/28/2020   OPHTHALMOLOGY EXAM  08/31/2020   HEMOGLOBIN A1C  12/27/2020   FOOT EXAM  05/16/2021   DEXA SCAN  Completed   PNA vac Low Risk Adult  Completed   HPV VACCINES  Aged Out    Health Maintenance  Health Maintenance Due  Topic Date Due   TETANUS/TDAP  Never done   Zoster Vaccines- Shingrix (1 of 2) Never done   COVID-19 Vaccine (4 - Booster for Moderna series) 03/25/2020    Colorectal cancer screening: No longer required.   Mammogram status: No longer required due to age.  Bone Density status: Completed 02/14/2015.   Lung Cancer Screening: (Low Dose CT Chest recommended if Age 17-80 years, 30 pack-year currently smoking OR have quit w/in 15years.) does not qualify.   Lung Cancer Screening Referral: no  Additional Screening:  Hepatitis  C Screening: does not qualify;   Vision Screening: Recommended annual ophthalmology exams for early detection of glaucoma and other disorders of the eye. Is the patient up to date with their annual eye exam?  Yes  Who is the provider or what is the name of the office in which the patient attends annual eye exams? Dr. Agnes Lawrence If pt is not established with a provider, would they like to be referred to a provider to establish care? No .   Dental Screening: Recommended annual dental exams for proper oral hygiene  Community Resource Referral / Chronic Care Management: CRR  required this visit?  No   CCM required this visit?  No      Plan:     I have personally reviewed and noted the following in the patient's chart:   Medical and social history Use of alcohol, tobacco or illicit drugs  Current medications and supplements including opioid prescriptions.  Functional ability and status Nutritional status Physical activity Advanced directives List of other physicians Hospitalizations, surgeries, and ER visits in previous 12 months Vitals Screenings to include cognitive, depression, and falls Referrals and appointments  In addition, I have reviewed and discussed with patient certain preventive protocols, quality metrics, and best practice recommendations. A written personalized care plan for preventive services as well as general preventive health recommendations were provided to patient.     [name], LPN   0/30/0923   Nurse Notes:

## 2020-07-27 ENCOUNTER — Encounter: Payer: Self-pay | Admitting: Nurse Practitioner

## 2020-08-03 ENCOUNTER — Encounter: Payer: Self-pay | Admitting: Nurse Practitioner

## 2020-08-03 NOTE — Telephone Encounter (Signed)
Received message from patient:   "Every morning I am faced with this problem: can hardly swallow, shortness of breath, mucus build (?) up in chest and mouth. Inhaler does not work.   The advertisement seen on line describes me to the "T" when they are advertising the AirPhysio (non-prescription but have been recommended by pulmalogist.  I am seeking your advise as to what mynext step should be aside from getting an exam of some sort.  Your advice is very much appreciated."  I found a link for the device she is talking about. [email or web address]  RA, can you please advise? Thanks!

## 2020-08-07 ENCOUNTER — Other Ambulatory Visit: Payer: Self-pay | Admitting: Nurse Practitioner

## 2020-08-07 ENCOUNTER — Encounter: Payer: Self-pay | Admitting: Nurse Practitioner

## 2020-08-07 DIAGNOSIS — R202 Paresthesia of skin: Secondary | ICD-10-CM

## 2020-08-08 ENCOUNTER — Telehealth: Payer: Self-pay

## 2020-08-08 NOTE — Chronic Care Management (AMB) (Signed)
    Chronic Care Management Pharmacy Assistant   Name: Donna Gilmore  MRN: [id number] DOB: [birth year]   Reason for Encounter: Patient Assistance Coordination  08/08/2020- Called patient regarding message sent to PCP office from patient referencing a letter which is requesting SSN#, Medicare A&B# and UHC ID#. Patient is not sure if she should fill out since we just came in gave this information.  Spoke with patient, she is aware we have already discussed together on this information so she can disregard the letter. Patient aware and understands.  08/10/2020- Called patient to inform her Rybelsus patient assistance medication arrived to PCP office 08/09/2020 and is at the front desk ready for pick up. Patient aware and very appreciative for the help.    SIG: Pattricia Boss, [facility] Pharmacist Assistant [phone]

## 2020-08-16 ENCOUNTER — Telehealth: Payer: Self-pay

## 2020-08-16 NOTE — Chronic Care Management (AMB) (Addendum)
   No answer, left message of telephone appointment with Orlando Penner CPP on 08-17-2020 at 11:30. Left message to have all medications, supplements, blood pressure and/or blood sugar logs available during appointment and to return call if need to reschedule.  08-17-2020: Called patient to reschedule appointment for today per Orlando Penner CPP. Patient states she will call back to reschedule because she is busy.  [facility] Pharmacist Assistant [phone]

## 2020-08-17 ENCOUNTER — Telehealth: Payer: Self-pay

## 2020-08-17 NOTE — Progress Notes (Signed)
Error

## 2020-09-01 DIAGNOSIS — E119 Type 2 diabetes mellitus without complications: Secondary | ICD-10-CM | POA: Diagnosis not present

## 2020-09-01 DIAGNOSIS — H2513 Age-related nuclear cataract, bilateral: Secondary | ICD-10-CM | POA: Diagnosis not present

## 2020-09-01 DIAGNOSIS — H35373 Puckering of macula, bilateral: Secondary | ICD-10-CM | POA: Diagnosis not present

## 2020-09-01 DIAGNOSIS — H04123 Dry eye syndrome of bilateral lacrimal glands: Secondary | ICD-10-CM | POA: Diagnosis not present

## 2020-09-01 DIAGNOSIS — H10413 Chronic giant papillary conjunctivitis, bilateral: Secondary | ICD-10-CM | POA: Diagnosis not present

## 2020-09-01 DIAGNOSIS — H0102B Squamous blepharitis left eye, upper and lower eyelids: Secondary | ICD-10-CM | POA: Diagnosis not present

## 2020-09-01 DIAGNOSIS — H0102A Squamous blepharitis right eye, upper and lower eyelids: Secondary | ICD-10-CM | POA: Diagnosis not present

## 2020-09-01 DIAGNOSIS — H47322 Drusen of optic disc, left eye: Secondary | ICD-10-CM | POA: Diagnosis not present

## 2020-09-04 ENCOUNTER — Other Ambulatory Visit: Payer: Self-pay

## 2020-09-04 ENCOUNTER — Encounter: Payer: Self-pay | Admitting: Adult Health

## 2020-09-04 ENCOUNTER — Ambulatory Visit: Payer: Medicare Other | Admitting: Adult Health

## 2020-09-04 DIAGNOSIS — J449 Chronic obstructive pulmonary disease, unspecified: Secondary | ICD-10-CM

## 2020-09-04 DIAGNOSIS — J310 Chronic rhinitis: Secondary | ICD-10-CM | POA: Insufficient documentation

## 2020-09-04 DIAGNOSIS — R0602 Shortness of breath: Secondary | ICD-10-CM

## 2020-09-04 NOTE — Assessment & Plan Note (Signed)
Patient shortness of breath appears to be mild.  Pulmonary function testing shows no significant obstruction or restriction.  May have a component of asthma.  Also deconditioning may be contributing.  Continue with pulmonary rehab as recommended.  Continue current regimen and follow-up.

## 2020-09-04 NOTE — Progress Notes (Signed)
$'@Patient'Y$  ID: Donna Gilmore, female    DOB: 07/16/1940, 80 y.o.   MRN: [id number]  Chief Complaint  Patient presents with   Follow-up    Referring provider: [name], FNP  HPI: 80 year old female former smoker seen for pulmonary consult July 04, 2020 to establish for COPD 30-pack-year history of smoking  TEST/EVENTS :  Spirometry 10/2016 ratio 74, FEV1 1.75/104%, FVC 108%, no bronchodilator response  09/04/2020  Follow up : COPD  Patient presents for a 76-monthfollow-up.  Patient was seen for pulmonary consult July 04, 2020 to establish for COPD.  Patient had some ongoing shortness of breath and decreased activity tolerance.  Had complained that since the pandemic started she was not as active and started to feel some shortness of breath.  She was recommended to begin pulmonary rehab.  Patient referral has been sent but she is on a waiting list currently.  Chest x-ray was completed and showed clear lungs. She was set up for pulmonary function testing that showed normal lung function with no airflow obstruction or restriction.  FEV1 as at 115%, ratio 84, FVC 105%. Patient had previously been started on [place]  Patient says that she does feel that BJudithann Saugerhas been helping lately.  She has less shortness of breath.  She really says her breathing problems are not very substantial currently.  She has no dyspnea at rest.  Does get some winded with heavy activity. She denies any significant cough or wheezing. She denies any chest pain, orthopnea, edema.   No Known Allergies  Immunization History  Administered Date(s) Administered   Fluad Quad(high Dose 65+) 10/19/2019   Influenza, High Dose Seasonal PF 11/05/2017   Influenza-Unspecified 10/06/2018   Moderna Sars-Covid-2 Vaccination 02/23/2019, 03/23/2019, 11/23/2019   Pneumococcal Conjugate-13 07/07/2018   Pneumococcal Polysaccharide-23 05/16/2020    Past Medical History:  Diagnosis Date   Benign paroxysmal positional vertigo  08/26/2012   CVA (cerebral infarction) 08/25/2012   Diabetes mellitus without complication [place]    Dyslipidemia 08/25/2012   Essential hypertension, benign 08/25/2012   Hyperlipidemia    Hypertension    Meralgia paraesthetica, left 01/12/2018   Stroke ([place]     Tobacco History: Social History   Tobacco Use  Smoking Status Former   Packs/day: 0.30   Years: 30.00   Pack years: 9.00   Types: Cigarettes   Quit date: 2019   Years since quitting: 3.6  Smokeless Tobacco Never   Counseling given: Not Answered   Outpatient Medications Prior to Visit  Medication Sig Dispense Refill   acetaminophen (TYLENOL) 500 MG tablet Take 1 tablet (500 mg total) by mouth every 6 (six) hours as needed. 30 tablet 0   albuterol (PROAIR HFA) 108 (90 Base) MCG/ACT inhaler Inhale 2 puffs into the lungs every 6 (six) hours as needed for wheezing or shortness of breath. 18 g 2   albuterol (VENTOLIN HFA) 108 (90 Base) MCG/ACT inhaler Inhale 2 puffs into the lungs every 6 (six) hours as needed for wheezing or shortness of breath. 8 g 6   atorvastatin (LIPITOR) 80 MG tablet TAKE 1 TABLET BY MOUTH  DAILY 90 tablet 3   Azelastine HCl 137 MCG/SPRAY SOLN Place 2 sprays into the nose daily. 30 mL 2   Budeson-Glycopyrrol-Formoterol (BREZTRI AEROSPHERE) 160-9-4.8 MCG/ACT AERO USE 2 INHALATIONS BY MOUTH  DAILY 32.1 g 2   cholecalciferol (VITAMIN D3) 25 MCG (1000 UNIT) tablet Take 1,000 Units by mouth daily.     clopidogrel (PLAVIX) 75 MG tablet TAKE 1 TABLET  BY MOUTH  DAILY 90 tablet 3   diazepam (VALIUM) 5 MG tablet Take 5 mg by mouth as needed for anxiety.     diclofenac Sodium (VOLTAREN) 1 % GEL Apply topically 4 (four) times daily.     donepezil (ARICEPT) 5 MG tablet TAKE 1 TABLET BY MOUTH  DAILY 90 tablet 3   gabapentin (NEURONTIN) 100 MG capsule TAKE 1 CAPSULE BY MOUTH 3  TIMES DAILY AS NEEDED 270 capsule 3   Ginkgo Biloba 40 MG TABS Take by mouth.     glucose blood (ONETOUCH ULTRA) test strip USE WITH METER TO  CHECK  BLOOD SUGAR BEFORE  BREAKFAST AND BEFORE DINNER dx: e11.65 300 strip 3   glucose blood (ONETOUCH ULTRA) test strip USE WITH METER TO CHECK  BLOOD SUGAR BEFORE  BREAKFAST AND BEFORE DINNER 200 strip 3   levocetirizine (XYZAL) 5 MG tablet Take 1 tablet (5 mg total) by mouth every evening. 90 tablet 1   magic mouthwash w/lidocaine SOLN Take 5 mLs by mouth 3 (three) times daily. 1 part benadryl, 1 part nystatin and 1 part lidocaine 120 mL 0   meclizine (ANTIVERT) 25 MG tablet TAKE 1 TABLET BY MOUTH 2  TIMES EVERY DAY AS NEEDED 90 tablet 1   metFORMIN (GLUCOPHAGE XR) 500 MG 24 hr tablet Take 1 tablet (500 mg total) by mouth daily with breakfast. 90 tablet 1   olmesartan-hydrochlorothiazide (BENICAR HCT) 40-12.5 MG tablet Take 1 tablet by mouth daily. 90 tablet 3   OVER THE COUNTER MEDICATION      Probiotic Product (PROBIOTIC-10 PO) Take by mouth. Take one tablet daily     RYBELSUS 7 MG TABS TAKE 1 TABLET BY MOUTH  DAILY 90 tablet 3   vitamin B-12 (CYANOCOBALAMIN) 100 MCG tablet Take 100 mcg by mouth daily.     vitamin C (ASCORBIC ACID) 500 MG tablet Take 500 mg by mouth daily.     VITAMIN D PO Take 1 tablet by mouth daily.     Spacer/Aero-Holding Chambers DEVI 1 each by Does not apply route. Please dispense spacer. DX: J44.9 (Patient not taking: Reported on 09/04/2020)     Facility-Administered Medications Prior to Visit  Medication Dose Route Frequency Provider Last Rate Last Admin   0.9 %  sodium chloride infusion  500 mL Intravenous Once [name], MD         Review of Systems:   Constitutional:   No  weight loss, night sweats,  Fevers, chills,  +fatigue, or  lassitude.  HEENT:   No headaches,  Difficulty swallowing,  Tooth/dental problems, or  Sore throat,                No sneezing, itching, ear ache, nasal congestion, post nasal drip,   CV:  No chest pain,  Orthopnea, PND, swelling in lower extremities, anasarca, dizziness, palpitations, syncope.   GI  No heartburn,  indigestion, abdominal pain, nausea, vomiting, diarrhea, change in bowel habits, loss of appetite, bloody stools.   Resp:   No chest wall deformity  Skin: no rash or lesions.  GU: no dysuria, change in color of urine, no urgency or frequency.  No flank pain, no hematuria   MS:  No joint pain or swelling.  No decreased range of motion.  No back pain.    Physical Exam  BP 126/72 (BP Location: Right Leg, Patient Position: Sitting, Cuff Size: Normal)   Pulse 76   Temp 98.3 F (36.8 C) (Oral)   Ht '5\' 6"'$  (1.676  m)   Wt 175 lb 3.2 oz (79.5 kg)   SpO2 100%   BMI 28.28 kg/m   GEN: A/Ox3; pleasant , NAD, well nourished    HEENT:  [place]/AT,  NOSE-clear, THROAT-clear, no lesions, no postnasal drip or exudate noted.   NECK:  Supple w/ fair ROM; no JVD; normal carotid impulses w/o bruits; no thyromegaly or nodules palpated; no lymphadenopathy.    RESP  Clear  P & A; w/o, wheezes/ rales/ or rhonchi. no accessory muscle use, no dullness to percussion  CARD:  RRR, no m/r/g, no peripheral edema, pulses intact, no cyanosis or clubbing.  GI:   Soft & nt; nml bowel sounds; no organomegaly or masses detected.   Musco: Warm bil, no deformities or joint swelling noted.   Neuro: alert, no focal deficits noted.    Skin: Warm, no lesions or rashes    Lab Results:  CBC    Component Value Date/Time   WBC 8.0 04/18/2020 1638   WBC 8.4 10/22/2017 2009   RBC 3.87 04/18/2020 1638   RBC 4.38 10/22/2017 2009   HGB 11.2 04/18/2020 1638   HCT 33.6 (L) 04/18/2020 1638   PLT 258 04/18/2020 1638   MCV 87 04/18/2020 1638   MCH 28.9 04/18/2020 1638   MCH 28.5 10/22/2017 2009   MCHC 33.3 04/18/2020 1638   MCHC 31.4 10/22/2017 2009   RDW 13.7 04/18/2020 1638   LYMPHSABS 2.2 09/30/2013 0920   MONOABS 0.5 09/30/2013 0920   EOSABS 0.1 09/30/2013 0920   BASOSABS 0.0 09/30/2013 0920    BMET    Component Value Date/Time   NA 138 04/18/2020 1638   K 4.2 04/18/2020 1638   CL 99 04/18/2020 1638    CO2 23 04/18/2020 1638   GLUCOSE 76 04/18/2020 1638   GLUCOSE 138 (H) 10/22/2017 2009   BUN 12 04/18/2020 1638   CREATININE 1.13 (H) 04/18/2020 1638   CALCIUM 9.8 04/18/2020 1638   GFRNONAA 48 (L) 10/19/2019 1619   GFRAA 56 (L) 10/19/2019 1619    BNP No results found for: BNP  ProBNP No results found for: PROBNP  Imaging: No results found.    PFT Results Latest Ref Rng & Units 07/14/2020  FVC-Pre L 2.14  FVC-Predicted Pre % 105  FVC-Post L 2.28  FVC-Predicted Post % 112  Pre FEV1/FVC % % 84  Post FEV1/FCV % % 84  FEV1-Pre L 1.81  FEV1-Predicted Pre % 115  FEV1-Post L 1.91    No results found for: NITRICOXIDE      Assessment & Plan:   COPD with asthma ([place]) Pulmonary function testing-does not show any significant airflow obstruction or restriction. This may be more underlying asthma than COPD. We discussed de-escalating care as she is on triple therapy and maintenance inhaler.  However she feels that it is helping.  For now we will continue on Breztri.  Activity as tolerated.  Pulmonary rehab to help with deconditioning.  Plan  Patient Instructions  Continue on BREZTRI 2 puffs Twice daily , rinse after use.  Add saline nasal rinses Twice daily  As needed   Continue on Xyzal daily  Astelin nasal 2 puffs daily  Albuterol inhaler As needed   Go to Pulmonary rehab when they are available  Activity as tolerated.  Follow up with Dr. Elsworth Soho in 4 months and As needed   Please contact office for sooner follow up if symptoms do not improve or worsen or seek emergency care       Chronic rhinitis Continue  on current regimen.  Shortness of breath Patient shortness of breath appears to be mild.  Pulmonary function testing shows no significant obstruction or restriction.  May have a component of asthma.  Also deconditioning may be contributing.  Continue with pulmonary rehab as recommended.  Continue current regimen and follow-up.    I spent   30 minutes dedicated to  the care of this patient on the date of this encounter to include pre-visit review of records, face-to-face time with the patient discussing conditions above, post visit ordering of testing, clinical documentation with the electronic health record, making appropriate referrals as documented, and communicating necessary findings to members of the patients care team.   Rexene Edison, NP 09/04/2020

## 2020-09-04 NOTE — Assessment & Plan Note (Signed)
Continue on current regimen .   

## 2020-09-04 NOTE — Assessment & Plan Note (Signed)
Pulmonary function testing-does not show any significant airflow obstruction or restriction. This may be more underlying asthma than COPD. We discussed de-escalating care as she is on triple therapy and maintenance inhaler.  However she feels that it is helping.  For now we will continue on Breztri.  Activity as tolerated.  Pulmonary rehab to help with deconditioning.  Plan  Patient Instructions  Continue on BREZTRI 2 puffs Twice daily , rinse after use.  Add saline nasal rinses Twice daily  As needed   Continue on Xyzal daily  Astelin nasal 2 puffs daily  Albuterol inhaler As needed   Go to Pulmonary rehab when they are available  Activity as tolerated.  Follow up with Dr. Elsworth Soho in 4 months and As needed   Please contact office for sooner follow up if symptoms do not improve or worsen or seek emergency care

## 2020-09-04 NOTE — Patient Instructions (Addendum)
Continue on BREZTRI 2 puffs Twice daily , rinse after use.  Add saline nasal rinses Twice daily  As needed   Continue on Xyzal daily  Astelin nasal 2 puffs daily  Albuterol inhaler As needed   Go to Pulmonary rehab when they are available  Activity as tolerated.  Follow up with Dr. Elsworth Soho in 4 months and As needed   Please contact office for sooner follow up if symptoms do not improve or worsen or seek emergency care

## 2020-09-08 ENCOUNTER — Telehealth (HOSPITAL_COMMUNITY): Payer: Self-pay

## 2020-09-08 ENCOUNTER — Encounter: Payer: Self-pay | Admitting: Nurse Practitioner

## 2020-09-08 NOTE — Telephone Encounter (Signed)
Called patient to see if she was interested in participating in the Pulmonary Rehab Program. Patient stated yes. Patient will come in for orientation on 10/20/20 @ 10:30AM and will attend the 10:15AM exercise class.   Tourist information centre manager.

## 2020-09-12 ENCOUNTER — Telehealth (HOSPITAL_COMMUNITY): Payer: Self-pay

## 2020-09-12 ENCOUNTER — Telehealth: Payer: Self-pay

## 2020-09-12 NOTE — Chronic Care Management (AMB) (Signed)
Chronic Care Management Pharmacy Assistant   Name: Donna Gilmore  MRN: [id number] DOB: 1940-02-12   Reason for Encounter: Disease State/ Hypertension    Recent office visits:  07-06-2020 Lynne Logan, [name]  07-07-2020 Daneen Schick (CCM)  07-25-2020 Daneen Schick (CCM)  07-26-2020 [name], LPN. Medicare annual wellness. Leukocytes UA= small   Recent consult visits:  07-04-2020 Rigoberto Noel, MD (Pulmonary). DG Chest 2 View, Pulmonary function test orders placed.   07-11-2020 Rigoberto Noel, MD. Lab visit  07-13-2020 [name], RD (Nutrition). Follow up in 3-4 months.  07-14-2020 Rigoberto Noel, MD (Pulmonary). Pulmonary function test performed.   09-04-2020 Parrett, Fonnie Mu, NP (Pulmonary). Follow up in 4 months.  Hospital visits:  None in previous 6 months  Medications: Outpatient Encounter Medications as of 09/12/2020  Medication Sig   acetaminophen (TYLENOL) 500 MG tablet Take 1 tablet (500 mg total) by mouth every 6 (six) hours as needed.   albuterol (PROAIR HFA) 108 (90 Base) MCG/ACT inhaler Inhale 2 puffs into the lungs every 6 (six) hours as needed for wheezing or shortness of breath.   albuterol (VENTOLIN HFA) 108 (90 Base) MCG/ACT inhaler Inhale 2 puffs into the lungs every 6 (six) hours as needed for wheezing or shortness of breath.   atorvastatin (LIPITOR) 80 MG tablet TAKE 1 TABLET BY MOUTH  DAILY   Azelastine HCl 137 MCG/SPRAY SOLN Place 2 sprays into the nose daily.   Budeson-Glycopyrrol-Formoterol (BREZTRI AEROSPHERE) 160-9-4.8 MCG/ACT AERO USE 2 INHALATIONS BY MOUTH  DAILY   cholecalciferol (VITAMIN D3) 25 MCG (1000 UNIT) tablet Take 1,000 Units by mouth daily.   clopidogrel (PLAVIX) 75 MG tablet TAKE 1 TABLET BY MOUTH  DAILY   diazepam (VALIUM) 5 MG tablet Take 5 mg by mouth as needed for anxiety.   diclofenac Sodium (VOLTAREN) 1 % GEL Apply topically 4 (four) times daily.   donepezil (ARICEPT) 5 MG tablet TAKE 1 TABLET BY  MOUTH  DAILY   gabapentin (NEURONTIN) 100 MG capsule TAKE 1 CAPSULE BY MOUTH 3  TIMES DAILY AS NEEDED   Ginkgo Biloba 40 MG TABS Take by mouth.   glucose blood (ONETOUCH ULTRA) test strip USE WITH METER TO CHECK  BLOOD SUGAR BEFORE  BREAKFAST AND BEFORE DINNER dx: e11.65   glucose blood (ONETOUCH ULTRA) test strip USE WITH METER TO CHECK  BLOOD SUGAR BEFORE  BREAKFAST AND BEFORE DINNER   levocetirizine (XYZAL) 5 MG tablet Take 1 tablet (5 mg total) by mouth every evening.   magic mouthwash w/lidocaine SOLN Take 5 mLs by mouth 3 (three) times daily. 1 part benadryl, 1 part nystatin and 1 part lidocaine   meclizine (ANTIVERT) 25 MG tablet TAKE 1 TABLET BY MOUTH 2  TIMES EVERY DAY AS NEEDED   metFORMIN (GLUCOPHAGE XR) 500 MG 24 hr tablet Take 1 tablet (500 mg total) by mouth daily with breakfast.   olmesartan-hydrochlorothiazide (BENICAR HCT) 40-12.5 MG tablet Take 1 tablet by mouth daily.   OVER THE COUNTER MEDICATION    Probiotic Product (PROBIOTIC-10 PO) Take by mouth. Take one tablet daily   RYBELSUS 7 MG TABS TAKE 1 TABLET BY MOUTH  DAILY   Spacer/Aero-Holding Chambers DEVI 1 each by Does not apply route. Please dispense spacer. DX: J44.9 (Patient not taking: Reported on 09/04/2020)   vitamin B-12 (CYANOCOBALAMIN) 100 MCG tablet Take 100 mcg by mouth daily.   vitamin C (ASCORBIC ACID) 500 MG tablet Take 500 mg by mouth daily.   VITAMIN  D PO Take 1 tablet by mouth daily.   Facility-Administered Encounter Medications as of 09/12/2020  Medication   0.9 %  sodium chloride infusion   Reviewed chart prior to disease state call. Spoke with patient regarding BP  Recent Office Vitals: BP Readings from Last 3 Encounters:  09/04/20 126/72  07/26/20 110/62  07/04/20 128/80   Pulse Readings from Last 3 Encounters:  09/04/20 76  07/26/20 71  07/04/20 65    Wt Readings from Last 3 Encounters:  09/04/20 175 lb 3.2 oz (79.5 kg)  07/26/20 180 lb 12.8 oz (82 kg)  07/13/20 181 lb (82.1 kg)      Kidney Function Lab Results  Component Value Date/Time   CREATININE 1.13 (H) 04/18/2020 04:38 PM   CREATININE 1.09 (H) 10/19/2019 04:19 PM   GFRNONAA 48 (L) 10/19/2019 04:19 PM   GFRAA 56 (L) 10/19/2019 04:19 PM    BMP Latest Ref Rng & Units 04/18/2020 10/19/2019 07/21/2019  Glucose 65 - 99 mg/dL 76 82 118(H)  BUN 8 - 27 mg/dL '12 10 14  '$ Creatinine 0.57 - 1.00 mg/dL 1.13(H) 1.09(H) 1.27(H)  BUN/Creat Ratio 12 - 28 11(L) 9(L) 11(L)  Sodium 134 - 144 mmol/L 138 143 140  Potassium 3.5 - 5.2 mmol/L 4.2 4.2 4.1  Chloride 96 - 106 mmol/L 99 102 103  CO2 20 - 29 mmol/L '23 23 21  '$ Calcium 8.7 - 10.3 mg/dL 9.8 10.0 9.7    Current antihypertensive regimen:  Olmesartan-Hydrochlorothiazide 40-12.5 mg daily  How often are you checking your Blood Pressure? twice daily  Current home BP readings: 109/67, 122/70, 117/72  What recent interventions/DTPs have been made by any provider to improve Blood Pressure control since last CPP Visit:  Educated on BP goals and benefits of medications for prevention of heart attack, stroke and kidney damage Daily salt intake goal < 2300 mg Importance of home blood pressure monitoring; Counseled to monitor BP at home at least three times per week, document, and provide log at future appointments  Any recent hospitalizations or ED visits since last visit with CPP? No  What diet changes have been made to improve Blood Pressure Control?  Patient states she works with a nutritionist that's been helping. Patient states she doesn't have much of an appetite but eats vegetables/fruits and air fries everything. Patient states she has cut back on sweets.  What exercise is being done to improve your Blood Pressure Control?  Patient states she doesn't exercise do to leg pain but she tries to walk some.  Adherence Review: Is the patient currently on ACE/ARB medication? Yes Does the patient have >5 day gap between last estimated fill dates? Yes  Care Gaps: Shingrix  overdue Moderna booster overdue Yearly ophthalmology exam overdue Last medicare annual 07-26-2020  Star Rating Drugs: Atorvastatin 80 MG- Last filled 05-10-2020 90 DS [place] 40-12.5 MG- Last filled 05-24-2020 90 DS [facility] Metformin 500 MG- Last filled 04-18-2020 90 DS [place] (patient reported not taking) Rybelsus 7 MG-  PAP   [place] Pharmacist Assistant [phone]

## 2020-09-12 NOTE — Telephone Encounter (Signed)
Called and spoke with pt in regards to PR, pt stated she is due to have eye surgery on 9/22 and 10/6. Adv pt we will contact her at a later date to reschedule her for PR.

## 2020-09-15 ENCOUNTER — Encounter: Payer: Self-pay | Admitting: Nurse Practitioner

## 2020-09-15 ENCOUNTER — Other Ambulatory Visit: Payer: Self-pay

## 2020-09-15 DIAGNOSIS — J310 Chronic rhinitis: Secondary | ICD-10-CM

## 2020-09-15 MED ORDER — AZELASTINE HCL 137 MCG/SPRAY NA SOLN: 2.0000 | Freq: Every day | NASAL | 2 refills | Status: DC

## 2020-09-21 ENCOUNTER — Other Ambulatory Visit: Payer: Self-pay | Admitting: Internal Medicine

## 2020-09-21 DIAGNOSIS — G3184 Mild cognitive impairment, so stated: Secondary | ICD-10-CM

## 2020-09-25 ENCOUNTER — Ambulatory Visit (INDEPENDENT_AMBULATORY_CARE_PROVIDER_SITE_OTHER): Payer: Medicare Other | Admitting: Nurse Practitioner

## 2020-09-25 ENCOUNTER — Other Ambulatory Visit: Payer: Self-pay

## 2020-09-25 ENCOUNTER — Encounter: Payer: Self-pay | Admitting: Nurse Practitioner

## 2020-09-25 VITALS — BP 130/72 | HR 70 | Temp 98.4°F | Ht 65.8 in | Wt 175.0 lb

## 2020-09-25 DIAGNOSIS — Z79899 Other long term (current) drug therapy: Secondary | ICD-10-CM | POA: Diagnosis not present

## 2020-09-25 DIAGNOSIS — E1169 Type 2 diabetes mellitus with other specified complication: Secondary | ICD-10-CM | POA: Diagnosis not present

## 2020-09-25 DIAGNOSIS — E1122 Type 2 diabetes mellitus with diabetic chronic kidney disease: Secondary | ICD-10-CM

## 2020-09-25 DIAGNOSIS — N1832 Chronic kidney disease, stage 3b: Secondary | ICD-10-CM | POA: Diagnosis not present

## 2020-09-25 DIAGNOSIS — E663 Overweight: Secondary | ICD-10-CM

## 2020-09-25 DIAGNOSIS — Z23 Encounter for immunization: Secondary | ICD-10-CM | POA: Diagnosis not present

## 2020-09-25 DIAGNOSIS — H259 Unspecified age-related cataract: Secondary | ICD-10-CM | POA: Diagnosis not present

## 2020-09-25 DIAGNOSIS — E782 Mixed hyperlipidemia: Secondary | ICD-10-CM

## 2020-09-25 DIAGNOSIS — I10 Essential (primary) hypertension: Secondary | ICD-10-CM | POA: Diagnosis not present

## 2020-09-25 DIAGNOSIS — Z0000 Encounter for general adult medical examination without abnormal findings: Secondary | ICD-10-CM | POA: Diagnosis not present

## 2020-09-25 DIAGNOSIS — R42 Dizziness and giddiness: Secondary | ICD-10-CM

## 2020-09-25 DIAGNOSIS — Z6828 Body mass index (BMI) 28.0-28.9, adult: Secondary | ICD-10-CM

## 2020-09-25 MED ORDER — ZOSTER VAC RECOMB ADJUVANTED 50 MCG/0.5ML IM SUSR: 0.5000 mL | Freq: Once | INTRAMUSCULAR | 1 refills | Status: AC

## 2020-09-25 NOTE — Progress Notes (Signed)
I,Tianna Badgett,acting as a Education administrator for [employer], FNP.,have documented all relevant documentation on the behalf of [name], FNP,as directed by  [name], FNP while in the presence of [name], [place].  This visit occurred during the SARS-CoV-2 public health emergency.  Safety protocols were in place, including screening questions prior to the visit, additional usage of staff PPE, and extensive cleaning of exam room while observing appropriate contact time as indicated for disinfecting solutions.  Subjective:     Patient ID: Donna Gilmore , female    DOB: 1940-01-31 , 80 y.o.   MRN: [id number]   Chief Complaint  Patient presents with   Annual Exam    HPI  Here for HM. She is accompanied by her caregiver Delcie Roch today.   Wt Readings from Last 3 Encounters: 09/25/20 : 175 lb (79.4 kg) 09/04/20 : 175 lb 3.2 oz (79.5 kg) 07/26/20 : 180 lb 12.8 oz (82 kg)  She was 188 lbs in   She is having weakness to her lower extremities.    Blood pressure 108/66 - 125/76  Hypertension This is a chronic problem. The current episode started more than 1 year ago. The problem is unchanged. Pertinent negatives include no headaches. There are no associated agents to hypertension. Risk factors for coronary artery disease include sedentary lifestyle. Past treatments include diuretics and ACE inhibitors. There are no compliance problems.  Hypertensive end-organ damage includes angina. There is no history of chronic renal disease.  Diabetes She presents for her follow-up diabetic visit. She has type 2 diabetes mellitus. Hypoglycemia symptoms include dizziness (had dizzy spell more severely and had shakes). Pertinent negatives for hypoglycemia include no headaches. There are no diabetic associated symptoms. Pertinent negatives for diabetes include no fatigue. There are no hypoglycemic complications. Risk factors for coronary artery disease include obesity and sedentary lifestyle. She is compliant  with treatment all of the time. She has not had a previous visit with a dietitian. She rarely participates in exercise. (110-128 blood sugar averages, she is not taking the metformin at this time. ) An ACE inhibitor/angiotensin II receptor blocker is being taken. She does not see a podiatrist.Eye exam is not current.    Past Medical History:  Diagnosis Date   Benign paroxysmal positional vertigo 08/26/2012   CVA (cerebral infarction) 08/25/2012   Diabetes mellitus without complication ([place])    Dyslipidemia 08/25/2012   Essential hypertension, benign 08/25/2012   Hyperlipidemia    Hypertension    Meralgia paraesthetica, left 01/12/2018   Stroke [facility])      Family History  Problem Relation Age of Onset   Kidney failure Mother    Diabetes Mother    Alzheimer's disease Mother    Alzheimer's disease Father    Prostate cancer Father    Diabetes Maternal Grandmother    Diabetes Maternal Grandfather    Alzheimer's disease Paternal Grandmother    Alzheimer's disease Paternal Grandfather    Prostate cancer Paternal Uncle      Current Outpatient Medications:    acetaminophen (TYLENOL) 500 MG tablet, Take 1 tablet (500 mg total) by mouth every 6 (six) hours as needed., Disp: 30 tablet, Rfl: 0   albuterol (PROAIR HFA) 108 (90 Base) MCG/ACT inhaler, Inhale 2 puffs into the lungs every 6 (six) hours as needed for wheezing or shortness of breath., Disp: 18 g, Rfl: 2   albuterol (VENTOLIN HFA) 108 (90 Base) MCG/ACT inhaler, Inhale 2 puffs into the lungs every 6 (six) hours as needed for wheezing or shortness of  breath., Disp: 8 g, Rfl: 6   Azelastine HCl 137 MCG/SPRAY SOLN, Place 2 sprays into the nose daily., Disp: 30 mL, Rfl: 2   Budeson-Glycopyrrol-Formoterol (BREZTRI AEROSPHERE) 160-9-4.8 MCG/ACT AERO, USE 2 INHALATIONS BY MOUTH  DAILY, Disp: 32.1 g, Rfl: 2   cholecalciferol (VITAMIN D3) 25 MCG (1000 UNIT) tablet, Take 1,000 Units by mouth daily., Disp: , Rfl:    clopidogrel (PLAVIX) 75 MG tablet,  TAKE 1 TABLET BY MOUTH  DAILY, Disp: 90 tablet, Rfl: 3   diazepam (VALIUM) 5 MG tablet, Take 5 mg by mouth as needed for anxiety., Disp: , Rfl:    diclofenac Sodium (VOLTAREN) 1 % GEL, Apply topically 4 (four) times daily., Disp: , Rfl:    donepezil (ARICEPT) 5 MG tablet, TAKE 1 TABLET BY MOUTH  DAILY, Disp: 90 tablet, Rfl: 3   gabapentin (NEURONTIN) 100 MG capsule, TAKE 1 CAPSULE BY MOUTH 3  TIMES DAILY AS NEEDED, Disp: 270 capsule, Rfl: 3   Ginkgo Biloba 40 MG TABS, Take by mouth., Disp: , Rfl:    glucose blood (ONETOUCH ULTRA) test strip, USE WITH METER TO CHECK  BLOOD SUGAR BEFORE  BREAKFAST AND BEFORE DINNER dx: e11.65, Disp: 300 strip, Rfl: 3   glucose blood (ONETOUCH ULTRA) test strip, USE WITH METER TO CHECK  BLOOD SUGAR BEFORE  BREAKFAST AND BEFORE DINNER, Disp: 200 strip, Rfl: 3   levocetirizine (XYZAL) 5 MG tablet, Take 1 tablet (5 mg total) by mouth every evening., Disp: 90 tablet, Rfl: 1   magic mouthwash w/lidocaine SOLN, Take 5 mLs by mouth 3 (three) times daily. 1 part benadryl, 1 part nystatin and 1 part lidocaine, Disp: 120 mL, Rfl: 0   meclizine (ANTIVERT) 25 MG tablet, TAKE 1 TABLET BY MOUTH 2  TIMES EVERY DAY AS NEEDED, Disp: 90 tablet, Rfl: 1   olmesartan-hydrochlorothiazide (BENICAR HCT) 40-12.5 MG tablet, Take 1 tablet by mouth daily., Disp: 90 tablet, Rfl: 3   OVER THE COUNTER MEDICATION, , Disp: , Rfl:    Probiotic Product (PROBIOTIC-10 PO), Take by mouth. Take one tablet daily, Disp: , Rfl:    RYBELSUS 7 MG TABS, TAKE 1 TABLET BY MOUTH  DAILY, Disp: 90 tablet, Rfl: 3   Spacer/Aero-Holding Chambers DEVI, 1 each by Does not apply route. Please dispense spacer. DX: J44.9, Disp: , Rfl:    vitamin B-12 (CYANOCOBALAMIN) 100 MCG tablet, Take 100 mcg by mouth daily., Disp: , Rfl:    vitamin C (ASCORBIC ACID) 500 MG tablet, Take 500 mg by mouth daily., Disp: , Rfl:    VITAMIN D PO, Take 1 tablet by mouth daily., Disp: , Rfl:    atorvastatin (LIPITOR) 80 MG tablet, TAKE 1 TABLET  BY MOUTH  DAILY, Disp: 90 tablet, Rfl: 3  Current Facility-Administered Medications:    0.9 %  sodium chloride infusion, 500 mL, Intravenous, Once, [name], MD   No Known Allergies    The patient states she uses post menopausal status for birth control. Last LMP was No LMP recorded. Patient is postmenopausal.. Negative for Dysmenorrhea and Negative for Menorrhagia. Negative for: breast discharge, breast lump(s), breast pain and breast self exam. Associated symptoms include abnormal vaginal bleeding. Pertinent negatives include abnormal bleeding (hematology), anxiety, decreased libido, depression, difficulty falling sleep, dyspareunia, history of infertility, nocturia, sexual dysfunction, sleep disturbances, urinary incontinence, urinary urgency, vaginal discharge and vaginal itching. Diet regular; she has been forgetting to eat. She eats cereal and oatmeal, will eat plenty of fruit and dinner. She eats approximately 2 meals a  day.  The patient states her exercise level is rarely due to feeling cramps in her legs.   The patient's tobacco use is:  Social History   Tobacco Use  Smoking Status Former   Packs/day: 0.30   Years: 30.00   Pack years: 9.00   Types: Cigarettes   Quit date: 2019   Years since quitting: 3.6  Smokeless Tobacco Never  . She has been exposed to passive smoke. The patient's alcohol use is:  Social History   Substance and Sexual Activity  Alcohol Use Yes   Comment: occasional    Review of Systems  Constitutional: Negative.  Negative for fatigue.  HENT: Negative.    Eyes: Negative.   Respiratory: Negative.    Cardiovascular: Negative.   Gastrointestinal: Negative.   Endocrine: Negative.   Genitourinary: Negative.   Musculoskeletal: Negative.   Skin: Negative.   Allergic/Immunologic: Negative.   Neurological:  Positive for dizziness (had dizzy spell more severely and had shakes). Negative for headaches.  Hematological: Negative.    Psychiatric/Behavioral: Negative.      Today's Vitals   09/25/20 1040  BP: 130/72  Pulse: 70  Temp: 98.4 F (36.9 C)  TempSrc: Oral  Weight: 175 lb (79.4 kg)  Height: 5' 5.8" (1.671 m)   Body mass index is 28.42 kg/m.  Wt Readings from Last 3 Encounters:  09/25/20 175 lb (79.4 kg)  09/04/20 175 lb 3.2 oz (79.5 kg)  07/26/20 180 lb 12.8 oz (82 kg)    Objective:  Physical Exam Constitutional:      General: She is not in acute distress.    Appearance: Normal appearance. She is well-developed.  HENT:     Head: Normocephalic and atraumatic.     Right Ear: Hearing, tympanic membrane, ear canal and external ear normal.     Left Ear: Hearing, tympanic membrane, ear canal and external ear normal.     Nose:     Comments: Deferred - masked    Mouth/Throat:     Comments: Deferred - masked Eyes:     General: Lids are normal.     Conjunctiva/sclera: Conjunctivae normal.     Pupils: Pupils are equal, round, and reactive to light.     Funduscopic exam:    Right eye: No papilledema.        Left eye: No papilledema.  Neck:     Thyroid: No thyroid mass.     Vascular: No carotid bruit.  Cardiovascular:     Rate and Rhythm: Normal rate and regular rhythm.     Pulses: Normal pulses.     Heart sounds: Normal heart sounds. No murmur heard. Pulmonary:     Effort: Pulmonary effort is normal.     Breath sounds: Normal breath sounds.  Abdominal:     General: Abdomen is flat. Bowel sounds are normal.     Palpations: Abdomen is soft.  Musculoskeletal:        General: No swelling. Normal range of motion.     Cervical back: Full passive range of motion without pain, normal range of motion and neck supple.     Right lower leg: No edema.     Left lower leg: No edema.  Skin:    General: Skin is warm and dry.     Capillary Refill: Capillary refill takes less than 2 seconds.  Neurological:     General: No focal deficit present.     Mental Status: She is alert and oriented to person,  place, and time.  Cranial Nerves: No cranial nerve deficit.     Sensory: No sensory deficit.     Motor: No weakness.  Psychiatric:        Mood and Affect: Mood normal.        Behavior: Behavior normal.        Thought Content: Thought content normal.        Judgment: Judgment normal.        Assessment And Plan:     1. Health maintenance examination  2. Essential hypertension Comments: Blood pressure is well controlled Continue current medications  3. Type 2 diabetes mellitus with other specified complication, without long-term current use of insulin (HCC) Comments: HgbA1c was up at last visit She is advised to take her metformin with the Rybelsus - Hemoglobin A1c - CMP14+EGFR  4. Mixed hyperlipidemia Comments: Cholesterol levels were up at last visit continue current medications - CMP14+EGFR - Lipid panel  5. Other long term (current) drug therapy - CBC  6. Age-related cataract of both eyes, unspecified age-related cataract type  7. Dizziness Comments: This is chronic for her and she is to continue taking meclizine as needed  8. Overweight with body mass index (BMI) of 28 to 28.9 in adult Comments: Congratulated on 13 lb weight loss She is encouraged to strive for BMI less than 30 to decrease cardiac risk. Advised to aim for at least 150 minutes of exercise per week.  9. Encounter for immunization - Zoster Vaccine Adjuvanted [facility]) injection; Inject 0.5 mLs into the muscle once for 1 dose. Administer 2nd dose in 2-6 months  Please fax when each dose administered [phone]  Dispense: 0.5 mL; Refill: 1      Patient was given opportunity to ask questions. Patient verbalized understanding of the plan and was able to repeat key elements of the plan. All questions were answered to their satisfaction.   [name], FNP   I, [name], FNP, have reviewed all documentation for this visit. The documentation on 10/06/20 for the exam, diagnosis, procedures,  and orders are all accurate and complete.  THE PATIENT IS ENCOURAGED TO PRACTICE SOCIAL DISTANCING DUE TO THE COVID-19 PANDEMIC.

## 2020-09-25 NOTE — Patient Instructions (Addendum)
Health Maintenance, Female Adopting a healthy lifestyle and getting preventive care are important in promoting health and wellness. Ask your health care provider about: The right schedule for you to have regular tests and exams. Things you can do on your own to prevent diseases and keep yourself healthy. What should I know about diet, weight, and exercise? Eat a healthy diet  Eat a diet that includes plenty of vegetables, fruits, low-fat dairy products, and lean protein. Do not eat a lot of foods that are high in solid fats, added sugars, or sodium.  Maintain a healthy weight Body mass index (BMI) is used to identify weight problems. It estimates body fat based on height and weight. Your health care provider can help determineyour BMI and help you achieve or maintain a healthy weight. Get regular exercise Get regular exercise. This is one of the most important things you can do for your health. Most adults should: Exercise for at least 150 minutes each week. The exercise should increase your heart rate and make you sweat (moderate-intensity exercise). Do strengthening exercises at least twice a week. This is in addition to the moderate-intensity exercise. Spend less time sitting. Even light physical activity can be beneficial. Watch cholesterol and blood lipids Have your blood tested for lipids and cholesterol at 80 years of age, then havethis test every 5 years. Have your cholesterol levels checked more often if: Your lipid or cholesterol levels are high. You are older than 80 years of age. You are at high risk for heart disease. What should I know about cancer screening? Depending on your health history and family history, you may need to have cancer screening at various ages. This may include screening for: Breast cancer. Cervical cancer. Colorectal cancer. Skin cancer. Lung cancer. What should I know about heart disease, diabetes, and high blood pressure? Blood pressure and heart  disease High blood pressure causes heart disease and increases the risk of stroke. This is more likely to develop in people who have high blood pressure readings, are of African descent, or are overweight. Have your blood pressure checked: Every 3-5 years if you are 18-39 years of age. Every year if you are 40 years old or older. Diabetes Have regular diabetes screenings. This checks your fasting blood sugar level. Have the screening done: Once every three years after age 40 if you are at a normal weight and have a low risk for diabetes. More often and at a younger age if you are overweight or have a high risk for diabetes. What should I know about preventing infection? Hepatitis B If you have a higher risk for hepatitis B, you should be screened for this virus. Talk with your health care provider to find out if you are at risk forhepatitis B infection. Hepatitis C Testing is recommended for: Everyone born from 1945 through 1965. Anyone with known risk factors for hepatitis C. Sexually transmitted infections (STIs) Get screened for STIs, including gonorrhea and chlamydia, if: You are sexually active and are younger than 80 years of age. You are older than 80 years of age and your health care provider tells you that you are at risk for this type of infection. Your sexual activity has changed since you were last screened, and you are at increased risk for chlamydia or gonorrhea. Ask your health care provider if you are at risk. Ask your health care provider about whether you are at high risk for HIV. Your health care provider may recommend a prescription medicine to help   prevent HIV infection. If you choose to take medicine to prevent HIV, you should first get tested for HIV. You should then be tested every 3 months for as long as you are taking the medicine. Pregnancy If you are about to stop having your period (premenopausal) and you may become pregnant, seek counseling before you get  pregnant. Take 400 to 800 micrograms (mcg) of folic acid every day if you become pregnant. Ask for birth control (contraception) if you want to prevent pregnancy. Osteoporosis and menopause Osteoporosis is a disease in which the bones lose minerals and strength with aging. This can result in bone fractures. If you are 35 years old or older, or if you are at risk for osteoporosis and fractures, ask your health care provider if you should: Be screened for bone loss. Take a calcium or vitamin D supplement to lower your risk of fractures. Be given hormone replacement therapy (HRT) to treat symptoms of menopause. Follow these instructions at home: Lifestyle Do not use any products that contain nicotine or tobacco, such as cigarettes, e-cigarettes, and chewing tobacco. If you need help quitting, ask your health care provider. Do not use street drugs. Do not share needles. Ask your health care provider for help if you need support or information about quitting drugs. Alcohol use Do not drink alcohol if: Your health care provider tells you not to drink. You are pregnant, may be pregnant, or are planning to become pregnant. If you drink alcohol: Limit how much you use to 0-1 drink a day. Limit intake if you are breastfeeding. Be aware of how much alcohol is in your drink. In the [place] one drink equals one 12 oz bottle of beer (355 mL), one 5 oz glass of wine (148 mL), or one 1 oz glass of hard liquor (44 mL). General instructions Schedule regular health, dental, and eye exams. Stay current with your vaccines. Tell your health care provider if: You often feel depressed. You have ever been abused or do not feel safe at home. Summary Adopting a healthy lifestyle and getting preventive care are important in promoting health and wellness. Follow your health care provider's instructions about healthy diet, exercising, and getting tested or screened for diseases. Follow your health care provider's  instructions on monitoring your cholesterol and blood pressure. This information is not intended to replace advice given to you by your health care provider. Make sure you discuss any questions you have with your healthcare provider. Document Revised: 01/07/2018 Document Reviewed: 01/07/2018 Elsevier Patient Education  2022 [place] an overt he counter probiotic daily to help with your bowels.

## 2020-09-26 ENCOUNTER — Encounter: Payer: Self-pay | Admitting: Nurse Practitioner

## 2020-09-26 LAB — CMP14+EGFR
ALT: 14 IU/L (ref 0–32)
AST: 18 IU/L (ref 0–40)
Albumin/Globulin Ratio: 1.5 (ref 1.2–2.2)
Albumin: 4.3 g/dL (ref 3.7–4.7)
Alkaline Phosphatase: 114 IU/L (ref 44–121)
BUN/Creatinine Ratio: 7 — ABNORMAL LOW (ref 12–28)
BUN: 11 mg/dL (ref 8–27)
Bilirubin Total: 0.2 mg/dL (ref 0.0–1.2)
CO2: 22 mmol/L (ref 20–29)
Calcium: 9.9 mg/dL (ref 8.7–10.3)
Chloride: 103 mmol/L (ref 96–106)
Creatinine, Ser: 1.58 mg/dL — ABNORMAL HIGH (ref 0.57–1.00)
Globulin, Total: 2.9 g/dL (ref 1.5–4.5)
Glucose: 98 mg/dL (ref 65–99)
Potassium: 4.6 mmol/L (ref 3.5–5.2)
Sodium: 141 mmol/L (ref 134–144)
Total Protein: 7.2 g/dL (ref 6.0–8.5)
eGFR: 33 mL/min/{1.73_m2} — ABNORMAL LOW (ref >59)

## 2020-09-26 LAB — HEMOGLOBIN A1C
Est. average glucose Bld gHb Est-mCnc: 151 mg/dL
Hgb A1c MFr Bld: 6.9 % — ABNORMAL HIGH (ref 4.8–5.6)

## 2020-09-26 LAB — CBC
Hematocrit: 34.1 % (ref 34.0–46.6)
Hemoglobin: 11.1 g/dL (ref 11.1–15.9)
MCH: 28.3 pg (ref 26.6–33.0)
MCHC: 32.6 g/dL (ref 31.5–35.7)
MCV: 87 fL (ref 79–97)
Platelets: 261 10*3/uL (ref 150–450)
RBC: 3.92 x10E6/uL (ref 3.77–5.28)
RDW: 13.9 % (ref 11.7–15.4)
WBC: 7.4 10*3/uL (ref 3.4–10.8)

## 2020-09-26 LAB — LIPID PANEL
Chol/HDL Ratio: 3.9 ratio (ref 0.0–4.4)
Cholesterol, Total: 174 mg/dL (ref 100–199)
HDL: 45 mg/dL (ref >39)
LDL Chol Calc (NIH): 88 mg/dL (ref 0–99)
Triglycerides: 247 mg/dL — ABNORMAL HIGH (ref 0–149)
VLDL Cholesterol Cal: 41 mg/dL — ABNORMAL HIGH (ref 5–40)

## 2020-09-27 ENCOUNTER — Encounter: Payer: Self-pay | Admitting: Nurse Practitioner

## 2020-09-27 ENCOUNTER — Telehealth: Payer: Medicare Other

## 2020-09-27 ENCOUNTER — Ambulatory Visit (INDEPENDENT_AMBULATORY_CARE_PROVIDER_SITE_OTHER): Payer: Medicare Other

## 2020-09-27 DIAGNOSIS — E782 Mixed hyperlipidemia: Secondary | ICD-10-CM

## 2020-09-27 DIAGNOSIS — E1169 Type 2 diabetes mellitus with other specified complication: Secondary | ICD-10-CM | POA: Diagnosis not present

## 2020-09-27 DIAGNOSIS — N1831 Chronic kidney disease, stage 3a: Secondary | ICD-10-CM | POA: Diagnosis not present

## 2020-09-27 DIAGNOSIS — J449 Chronic obstructive pulmonary disease, unspecified: Secondary | ICD-10-CM

## 2020-09-27 DIAGNOSIS — I10 Essential (primary) hypertension: Secondary | ICD-10-CM

## 2020-09-28 ENCOUNTER — Other Ambulatory Visit: Payer: Self-pay | Admitting: Nurse Practitioner

## 2020-10-04 ENCOUNTER — Telehealth: Payer: Medicare Other

## 2020-10-04 ENCOUNTER — Ambulatory Visit (INDEPENDENT_AMBULATORY_CARE_PROVIDER_SITE_OTHER): Payer: Medicare Other

## 2020-10-04 DIAGNOSIS — I10 Essential (primary) hypertension: Secondary | ICD-10-CM

## 2020-10-04 DIAGNOSIS — E1169 Type 2 diabetes mellitus with other specified complication: Secondary | ICD-10-CM

## 2020-10-04 DIAGNOSIS — J449 Chronic obstructive pulmonary disease, unspecified: Secondary | ICD-10-CM

## 2020-10-04 DIAGNOSIS — N1831 Chronic kidney disease, stage 3a: Secondary | ICD-10-CM

## 2020-10-04 DIAGNOSIS — E782 Mixed hyperlipidemia: Secondary | ICD-10-CM

## 2020-10-05 NOTE — Patient Instructions (Signed)
Goals Addressed      Chronic Kidney disease - disease progression minimized or prevented   On track    Timeframe:  Long-Range Goal Priority:  High Start Date: 05/25/20                            Expected End Date: 05/25/21  Next Follow up date: 12/28/20      Continue to adhere to MD recommendations for CKD  Continue to keep all scheduled follow up appointments Take medications as directed  Let your healthcare team know if you are unable to take your medications Call your pharmacy for refills at least 7 days prior to running out of medication Increase your water intake unless otherwise directed Keep BP and diabetes under good control

## 2020-10-05 NOTE — Chronic Care Management (AMB) (Signed)
Chronic Care Management   CCM RN Visit Note  09/27/2020 Name: Donna Gilmore MRN: [id number] DOB: Mar 20, 1940  Subjective: Donna Gilmore is a 80 y.o. year old female who is a primary care patient of [name], [place]. The care management team was consulted for assistance with disease management and care coordination needs.    Engaged with patient by telephone for follow up visit in response to provider referral for case management and/or care coordination services.   Consent to Services:  The patient was given information about Chronic Care Management services, agreed to services, and gave verbal consent prior to initiation of services.  Please see initial visit note for detailed documentation.   Patient agreed to services and verbal consent obtained.   Assessment: Review of patient past medical history, allergies, medications, health status, including review of consultants reports, laboratory and other test data, was performed as part of comprehensive evaluation and provision of chronic care management services.   SDOH (Social Determinants of Health) assessments and interventions performed:    CCM Care Plan  No Known Allergies  Outpatient Encounter Medications as of 09/27/2020  Medication Sig   acetaminophen (TYLENOL) 500 MG tablet Take 1 tablet (500 mg total) by mouth every 6 (six) hours as needed.   albuterol (PROAIR HFA) 108 (90 Base) MCG/ACT inhaler Inhale 2 puffs into the lungs every 6 (six) hours as needed for wheezing or shortness of breath.   albuterol (VENTOLIN HFA) 108 (90 Base) MCG/ACT inhaler Inhale 2 puffs into the lungs every 6 (six) hours as needed for wheezing or shortness of breath.   Azelastine HCl 137 MCG/SPRAY SOLN Place 2 sprays into the nose daily.   Budeson-Glycopyrrol-Formoterol (BREZTRI AEROSPHERE) 160-9-4.8 MCG/ACT AERO USE 2 INHALATIONS BY MOUTH  DAILY   cholecalciferol (VITAMIN D3) 25 MCG (1000 UNIT) tablet Take 1,000 Units by mouth daily.    clopidogrel (PLAVIX) 75 MG tablet TAKE 1 TABLET BY MOUTH  DAILY   diazepam (VALIUM) 5 MG tablet Take 5 mg by mouth as needed for anxiety.   diclofenac Sodium (VOLTAREN) 1 % GEL Apply topically 4 (four) times daily.   donepezil (ARICEPT) 5 MG tablet TAKE 1 TABLET BY MOUTH  DAILY   gabapentin (NEURONTIN) 100 MG capsule TAKE 1 CAPSULE BY MOUTH 3  TIMES DAILY AS NEEDED   Ginkgo Biloba 40 MG TABS Take by mouth.   glucose blood (ONETOUCH ULTRA) test strip USE WITH METER TO CHECK  BLOOD SUGAR BEFORE  BREAKFAST AND BEFORE DINNER dx: e11.65   glucose blood (ONETOUCH ULTRA) test strip USE WITH METER TO CHECK  BLOOD SUGAR BEFORE  BREAKFAST AND BEFORE DINNER   levocetirizine (XYZAL) 5 MG tablet Take 1 tablet (5 mg total) by mouth every evening.   magic mouthwash w/lidocaine SOLN Take 5 mLs by mouth 3 (three) times daily. 1 part benadryl, 1 part nystatin and 1 part lidocaine   meclizine (ANTIVERT) 25 MG tablet TAKE 1 TABLET BY MOUTH 2  TIMES EVERY DAY AS NEEDED   olmesartan-hydrochlorothiazide (BENICAR HCT) 40-12.5 MG tablet Take 1 tablet by mouth daily.   OVER THE COUNTER MEDICATION    Probiotic Product (PROBIOTIC-10 PO) Take by mouth. Take one tablet daily   RYBELSUS 7 MG TABS TAKE 1 TABLET BY MOUTH  DAILY   Spacer/Aero-Holding Chambers DEVI 1 each by Does not apply route. Please dispense spacer. DX: J44.9   vitamin B-12 (CYANOCOBALAMIN) 100 MCG tablet Take 100 mcg by mouth daily.   vitamin C (ASCORBIC ACID) 500 MG tablet Take 500  mg by mouth daily.   VITAMIN D PO Take 1 tablet by mouth daily.   [DISCONTINUED] atorvastatin (LIPITOR) 80 MG tablet TAKE 1 TABLET BY MOUTH  DAILY   Facility-Administered Encounter Medications as of 09/27/2020  Medication   0.9 %  sodium chloride infusion    Patient Active Problem List   Diagnosis Date Noted   Chronic rhinitis 09/04/2020   COPD with asthma ([employer]) 02/25/2019   Peripheral vascular disease ([place]) 02/25/2019   Chronic bilateral low back pain without sciatica  06/01/2018   Class 1 obesity due to excess calories without serious comorbidity with body mass index (BMI) of 32.0 to 32.9 in adult 06/01/2018   Acute left-sided low back pain with left-sided sciatica 05/04/2018   Shortness of breath 05/04/2018   Meralgia paraesthetica, left 01/12/2018   Mild cognitive impairment 10/14/2016   History of stroke 10/14/2016   History of syphilis 10/14/2016   Mixed hyperlipidemia 10/14/2016   Insomnia 10/14/2016   Cerebellar stroke syndrome 10/23/2013   Smoker 02/16/2013   Chest pain 09/21/2012   Tobacco abuse disorder 09/21/2012   Benign paroxysmal positional vertigo 08/26/2012   CVA (cerebral infarction) 08/25/2012   Essential hypertension 08/25/2012   Dyslipidemia 08/25/2012    Conditions to be addressed/monitored: Essential hypertension, Type 2 diabetes mellitus, history of COPD, Stage 3 a Chronic Kidney disease, Mixed Hyperlipidemia    Care Plan : Diabetes Type 2 (Adult)  Updates made by Lynne Logan, RN since 09/27/2020 12:00 AM     Problem: Glycemic Management (Diabetes, Type 2)   Priority: High     Long-Range Goal: Glycemic Management Optimized   Start Date: 05/25/2020  Expected End Date: 05/25/2021  Recent Progress: On track  Priority: High  Note:   Objective:  Lab Results  Component Value Date   HGBA1C 6.9 (H) 09/25/2020   Lab Results  Component Value Date   CREATININE 1.58 (H) 09/25/2020   CREATININE 1.13 (H) 04/18/2020   CREATININE 1.09 (H) 10/19/2019   Lab Results  Component Value Date   EGFR 33 (L) 09/25/2020   Current Barriers:  Knowledge Deficits related to basic Diabetes pathophysiology and self care/management Knowledge Deficits related to medications used for management of diabetes Case Manager Clinical Goal(s):  patient will demonstrate improved adherence to prescribed treatment plan for diabetes self care/management as evidenced by: daily monitoring and recording of CBG  adherence to ADA/ carb modified diet  exercise 5 days/week adherence to prescribed medication regimen contacting provider for new or worsened symptoms or questions Interventions:  09/27/20 completed successful outbound call with patient  Collaboration with [name], [place] regarding development and update of comprehensive plan of care as evidenced by provider attestation and co-signature Inter-disciplinary care team collaboration (see longitudinal plan of care) Provided education to patient about basic DM disease process Review of patient status, including review of consultants reports, relevant laboratory and other test results, and medications completed. Educated patient on dietary and exercise recommendations; daily glycemic control FBS 80-130, <180 after meals;15'15' rule Reviewed medications with patient and discussed importance of medication adherence Advised patient, providing education and rationale, to check cbg daily before meals and at bedtime and record, calling the PCP and or CCM RN for findings outside established parameters.   Mailed Engineer, civil (consulting) related to Low Carb Smoothies; Carb List; Paramedic with Diabetes  Discussed plans with patient for ongoing care management follow up and provided patient with direct contact information for care management team Self-Care Activities Self administers oral medications as prescribed Attends all  scheduled provider appointments Checks blood sugars as prescribed and utilize hyper and hypoglycemia protocol as needed Adheres to prescribed ADA/carb modified Patient Goals: - check blood sugar at prescribed times - check blood sugar if I feel it is too high or too low - enter blood sugar readings and medication or insulin into daily log - take the blood sugar log to all doctor visits - take the blood sugar meter to all doctor visits - manage portion size - keep appointment with eye doctor - schedule appointment with eye doctor - check feet daily for cuts,  sores or redness - keep feet up while sitting - trim toenails straight across - wash and dry feet carefully every day - wear comfortable, cotton socks - wear comfortable, well-fitting shoes  Follow Up Plan: Telephone follow up appointment with care management team member scheduled for: 12/28/20    Care Plan : Chronic Kidney disease  Updates made by Lynne Logan, RN since 09/27/2020 12:00 AM     Problem: Chronic Kidney disease   Priority: High     Long-Range Goal: Chronic Kidney disease - disease progression minimized or prevented   Start Date: 05/25/2020  Expected End Date: 05/25/2021  Recent Progress: On track  Priority: High  Note:   Current Barriers:  Ineffective Self Health Maintenance  Clinical Goal(s):  Collaboration with [name], FNP regarding development and update of comprehensive plan of care as evidenced by provider attestation and co-signature Inter-disciplinary care team collaboration (see longitudinal plan of care) patient will work with care management team to address care coordination and chronic disease management needs related to Disease Management Educational Needs Care Coordination Medication Management and Education Psychosocial Support   Interventions:  09/27/20 completed successful outbound call with patient  Evaluation of current treatment plan related to CKD Stage III , self-management and patient's adherence to plan as established by provider. Collaboration with [name], FNP regarding development and update of comprehensive plan of care as evidenced by provider attestation       and co-signature Inter-disciplinary care team collaboration (see longitudinal plan of care) Provided education to patient about basic disease process related to Chronic Kidney disease  Review of patient status, including review of consultants reports, relevant laboratory and other test results, and medications completed. Reviewed medications with patient and  discussed importance of medication adherence Educated patient on the importance of increasing water to 64 oz daily unless otherwise directed  Mailed printed educational material related to Eating Right with Chronic Kidney disease  Discussed plans with patient for ongoing care management follow up and provided patient with direct contact information for care management team Self Care Activities:  Continue to adhere to MD recommendations for CKD  Continue to keep all scheduled follow up appointments Take medications as directed  Let your healthcare team know if you are unable to take your medications Call your pharmacy for refills at least 7 days prior to running out of medication Patient Goals: - increase water intake to 64 oz daily - follow dietary recommendations - keep BP and diabetes under good control   Follow Up Plan: Telephone follow up appointment with care management team member scheduled for: 12/28/20    Care Plan : Mixed Hyperlipdemia  Updates made by Lynne Logan, RN since 09/27/2020 12:00 AM     Problem: Mixed Hyperlipidemia   Priority: High     Long-Range Goal: Mixed Hyperlipidemia - disease progression minimized or prevented   Start Date: 05/25/2020  Expected End Date: 05/25/2021  Recent  Progress: On track  Priority: High  Note:   Current Barriers:  Ineffective Self Health Maintenance  Clinical Goal(s):  Collaboration with [name], FNP regarding development and update of comprehensive plan of care as evidenced by provider attestation and co-signature Inter-disciplinary care team collaboration (see longitudinal plan of care) patient will work with care management team to address care coordination and chronic disease management needs related to Disease Management Educational Needs Care Coordination Medication Management and Education Psychosocial Support   Interventions:  09/27/20 completed successful outbound call with patient  Evaluation of current  treatment plan related to  Mixed Hyperlipidemia  , self-management and patient's adherence to plan as established by provider. Collaboration with [name], FNP regarding development and update of comprehensive plan of care as evidenced by provider attestation       and co-signature Inter-disciplinary care team collaboration (see longitudinal plan of care) Provided education to patient about basic disease process related to Mixed Hyperlipidemia Review of patient status, including review of consultants reports, relevant laboratory and other test results, and medications completed. Reviewed medications with patient and discussed importance of medication adherence Educated patient on dietary and exercise recommendations  Mailed printed educational materials related to Cooking to lower Cholesterol  Discussed plans with patient for ongoing care management follow up and provided patient with direct contact information for care management team Self Care Activities:   Continue to keep all scheduled follow up appointments Take medications as directed  Let your healthcare team know if you are unable to take your medications Call your pharmacy for refills at least 7 days prior to running out of medication Patient Goals: - follow dietary and exercise recommendations   Follow Up Plan: Telephone follow up appointment with care management team member scheduled for: 12/28/20     Care Plan : COPD (Adult)  Updates made by Lynne Logan, RN since 09/27/2020 12:00 AM     Problem: Disease Progression (COPD)   Priority: High     Long-Range Goal: Disease Progression Minimized or Managed   Start Date: 05/25/2020  Expected End Date: 05/25/2021  Recent Progress: On track  Priority: High  Note:   Current Barriers:  Knowledge deficits related to basic understanding of COPD disease process Knowledge deficits related to basic COPD self care/management Case Manager Clinical Goal(s): patient will verbalize  understanding of COPD action plan and when to seek appropriate levels of medical care patient will verbalize basic understanding of COPD disease process and self care activities patient will not be hospitalized for COPD exacerbation as evidenced  Interventions:  09/27/20 completed successful outbound call with patient  Collaboration with [name], [place] regarding development and update of comprehensive plan of care as evidenced by provider attestation and co-signature Inter-disciplinary care team collaboration (see longitudinal plan of care) Determined patient completed her follow with Dr. Elsworth Soho Pulmonologist on 09/04/20 with the following Assessment/Plan reviewed:  Pulmonary function testing-does not show any significant airflow obstruction or restriction. This may be more underlying asthma than COPD. We discussed de-escalating care as she is on triple therapy and maintenance inhaler.  However she feels that it is helping.  For now we will continue on Breztri.  Activity as tolerated.  Pulmonary rehab to help with deconditioning.   Plan  Patient Instructions  Continue on BREZTRI 2 puffs Twice daily , rinse after use.  Add saline nasal rinses Twice daily  As needed   Continue on Xyzal daily  Astelin nasal 2 puffs daily  Albuterol inhaler As needed   Go  to Pulmonary rehab when they are available  Activity as tolerated.  Follow up with Dr. Elsworth Soho in 4 months and As needed   Please contact office for sooner follow up if symptoms do not improve or worsen or seek emergency care  Determined patient will attend new patient orientation for Pulmonary Rehab on 10/20/20  Determined patient verbalizes understanding of her prescribed treatment plan Reviewed medications with patient and discussed importance of medication adherence; Assessed for barriers Sent in basket message to embedded Pharm D Orlando Penner requesting follow up on a previous referral sent in June to assist patient with drug cost of her  inhalers Discussed plans with patient for ongoing care management follow up and provided patient with direct contact information for care management team Self-Care Activities:  Patient verbalizes understanding of plan to contact [facility] for evaluation and treatment of COPD Self administers medications as prescribed Attends all scheduled provider appointments Calls provider office for new concerns or questions Patient Goals: - do exercises in a comfortable position that makes breathing as easy as possible - keep follow-up appointments - avoid second hand smoke - identify and remove indoor air pollutants - limit outdoor activity during cold weather  Follow Up Plan: Telephone follow up appointment with care management team member scheduled for: 12/28/20    Plan:Telephone follow up appointment with care management team member scheduled for:  12/28/20  Barb Merino, RN, BSN, CCM Care Management Coordinator [facility]  Direct Phone: [phone]

## 2020-10-05 NOTE — Chronic Care Management (AMB) (Signed)
Chronic Care Management   CCM RN Visit Note  10/04/2020 Name: Donna Gilmore MRN: [id number] DOB: 04-Apr-1940  Subjective: Donna Gilmore is a 80 y.o. year old female who is a primary care patient of [name], [place]. The care management team was consulted for assistance with disease management and care coordination needs.    Engaged with patient by telephone for follow up visit in response to provider referral for case management and/or care coordination services.   Consent to Services:  The patient was given information about Chronic Care Management services, agreed to services, and gave verbal consent prior to initiation of services.  Please see initial visit note for detailed documentation.   Patient agreed to services and verbal consent obtained.   Assessment: Review of patient past medical history, allergies, medications, health status, including review of consultants reports, laboratory and other test data, was performed as part of comprehensive evaluation and provision of chronic care management services.   SDOH (Social Determinants of Health) assessments and interventions performed:  Yes, no acute challenges   CCM Care Plan  No Known Allergies  Outpatient Encounter Medications as of 10/04/2020  Medication Sig   acetaminophen (TYLENOL) 500 MG tablet Take 1 tablet (500 mg total) by mouth every 6 (six) hours as needed.   albuterol (PROAIR HFA) 108 (90 Base) MCG/ACT inhaler Inhale 2 puffs into the lungs every 6 (six) hours as needed for wheezing or shortness of breath.   albuterol (VENTOLIN HFA) 108 (90 Base) MCG/ACT inhaler Inhale 2 puffs into the lungs every 6 (six) hours as needed for wheezing or shortness of breath.   atorvastatin (LIPITOR) 80 MG tablet TAKE 1 TABLET BY MOUTH  DAILY   Azelastine HCl 137 MCG/SPRAY SOLN Place 2 sprays into the nose daily.   Budeson-Glycopyrrol-Formoterol (BREZTRI AEROSPHERE) 160-9-4.8 MCG/ACT AERO USE 2 INHALATIONS BY MOUTH  DAILY    cholecalciferol (VITAMIN D3) 25 MCG (1000 UNIT) tablet Take 1,000 Units by mouth daily.   clopidogrel (PLAVIX) 75 MG tablet TAKE 1 TABLET BY MOUTH  DAILY   diazepam (VALIUM) 5 MG tablet Take 5 mg by mouth as needed for anxiety.   diclofenac Sodium (VOLTAREN) 1 % GEL Apply topically 4 (four) times daily.   donepezil (ARICEPT) 5 MG tablet TAKE 1 TABLET BY MOUTH  DAILY   gabapentin (NEURONTIN) 100 MG capsule TAKE 1 CAPSULE BY MOUTH 3  TIMES DAILY AS NEEDED   Ginkgo Biloba 40 MG TABS Take by mouth.   glucose blood (ONETOUCH ULTRA) test strip USE WITH METER TO CHECK  BLOOD SUGAR BEFORE  BREAKFAST AND BEFORE DINNER dx: e11.65   glucose blood (ONETOUCH ULTRA) test strip USE WITH METER TO CHECK  BLOOD SUGAR BEFORE  BREAKFAST AND BEFORE DINNER   levocetirizine (XYZAL) 5 MG tablet Take 1 tablet (5 mg total) by mouth every evening.   magic mouthwash w/lidocaine SOLN Take 5 mLs by mouth 3 (three) times daily. 1 part benadryl, 1 part nystatin and 1 part lidocaine   meclizine (ANTIVERT) 25 MG tablet TAKE 1 TABLET BY MOUTH 2  TIMES EVERY DAY AS NEEDED   olmesartan-hydrochlorothiazide (BENICAR HCT) 40-12.5 MG tablet Take 1 tablet by mouth daily.   OVER THE COUNTER MEDICATION    Probiotic Product (PROBIOTIC-10 PO) Take by mouth. Take one tablet daily   RYBELSUS 7 MG TABS TAKE 1 TABLET BY MOUTH  DAILY   Spacer/Aero-Holding Chambers DEVI 1 each by Does not apply route. Please dispense spacer. DX: J44.9   vitamin B-12 (CYANOCOBALAMIN) 100 MCG  tablet Take 100 mcg by mouth daily.   vitamin C (ASCORBIC ACID) 500 MG tablet Take 500 mg by mouth daily.   VITAMIN D PO Take 1 tablet by mouth daily.   Facility-Administered Encounter Medications as of 10/04/2020  Medication   0.9 %  sodium chloride infusion    Patient Active Problem List   Diagnosis Date Noted   Chronic rhinitis 09/04/2020   COPD with asthma ([place]) 02/25/2019   Peripheral vascular disease ([place]) 02/25/2019   Chronic bilateral low back pain without  sciatica 06/01/2018   Class 1 obesity due to excess calories without serious comorbidity with body mass index (BMI) of 32.0 to 32.9 in adult 06/01/2018   Acute left-sided low back pain with left-sided sciatica 05/04/2018   Shortness of breath 05/04/2018   Meralgia paraesthetica, left 01/12/2018   Mild cognitive impairment 10/14/2016   History of stroke 10/14/2016   History of syphilis 10/14/2016   Mixed hyperlipidemia 10/14/2016   Insomnia 10/14/2016   Cerebellar stroke syndrome 10/23/2013   Smoker 02/16/2013   Chest pain 09/21/2012   Tobacco abuse disorder 09/21/2012   Benign paroxysmal positional vertigo 08/26/2012   CVA (cerebral infarction) 08/25/2012   Essential hypertension 08/25/2012   Dyslipidemia 08/25/2012    Conditions to be addressed/monitored: Essential hypertension, Type 2 diabetes mellitus, history of COPD, Stage 3 a Chronic Kidney disease, Mixed Hyperlipidemia   Care Plan : Chronic Kidney disease  Updates made by Lynne Logan, RN since 10/04/2020 12:00 AM     Problem: Chronic Kidney disease   Priority: High     Long-Range Goal: Chronic Kidney disease - disease progression minimized or prevented   Start Date: 05/25/2020  Expected End Date: 05/25/2021  Recent Progress: On track  Priority: High  Note:   Current Barriers:  Ineffective Self Health Maintenance  Clinical Goal(s):  Collaboration with [name], FNP regarding development and update of comprehensive plan of care as evidenced by provider attestation and co-signature Inter-disciplinary care team collaboration (see longitudinal plan of care) patient will work with care management team to address care coordination and chronic disease management needs related to Disease Management Educational Needs Care Coordination Medication Management and Education Psychosocial Support   Interventions:  09/27/20 completed successful outbound call with patient  Evaluation of current treatment plan related to CKD  Stage III , self-management and patient's adherence to plan as established by provider. Collaboration with [name], FNP regarding development and update of comprehensive plan of care as evidenced by provider attestation       and co-signature Inter-disciplinary care team collaboration (see longitudinal plan of care) Provided education to patient about basic disease process related to Chronic Kidney disease  Review of patient status, including review of consultants reports, relevant laboratory and other test results, and medications completed. Reviewed medications with patient and discussed importance of medication adherence Educated patient on the importance of increasing water to 64 oz daily unless otherwise directed  Mailed printed educational material related to Eating Right with Chronic Kidney disease  Discussed plans with patient for ongoing care management follow up and provided patient with direct contact information for care management team 10/04/20 completed inbound call with patient Determined patient was advised by PCP at last visit she would be referred to a kidney specialist Discussed Ms. Furnari is unsure if she should be calling the kidney doctor to schedule her appointment and would like more information about who she will be referred to  Reviewed patient encounter from last PCP OV, determined PCP will send  a referral for Nephrology to further evaluate CKD Advised patient of the referral process, sent in basket message to [name] FNP advising the Nephrology referral has not been placed and patient is calling to inquire Received reply from PCP advising she will enter the referral as previously discussed, patient made aware and verbalizes understanding of next steps  Discussed plans with patient for ongoing care management follow up and provided patient with direct contact information for care management team  Self Care Activities:  Continue to adhere to MD recommendations  for CKD  Continue to keep all scheduled follow up appointments Take medications as directed  Let your healthcare team know if you are unable to take your medications Call your pharmacy for refills at least 7 days prior to running out of medication Patient Goals: - increase water intake to 64 oz daily - follow dietary recommendations - keep BP and diabetes under good control   Follow Up Plan: Telephone follow up appointment with care management team member scheduled for: 12/28/20     Next Scheduled RN CM follow up call: 12/28/20  Barb Merino, RN, BSN, CCM Care Management Coordinator [facility]  Direct Phone: [phone]

## 2020-10-05 NOTE — Patient Instructions (Signed)
Goals Addressed      Chronic Kidney disease - disease progression minimized or prevented   On track    Timeframe:  Long-Range Goal Priority:  High Start Date: 05/25/20                            Expected End Date: 05/25/21  Next Follow up date: 12/28/20      Continue to adhere to MD recommendations for CKD  Continue to keep all scheduled follow up appointments Take medications as directed  Let your healthcare team know if you are unable to take your medications Call your pharmacy for refills at least 7 days prior to running out of medication Increase your water intake unless otherwise directed Keep BP and diabetes under good control           COPD - disease progression minimized or prevented   On track    Timeframe:  Long-Range Goal Priority:  High Start Date:  05/25/20                           Expected End Date: 05/25/21     Next Follow up date: 12/28/20      Self-Care Activities:  Patient verbalizes understanding of plan to contact [facility] for evaluation and treatment of COPD Self administers medications as prescribed Attends all scheduled provider appointments Calls provider office for new concerns or questions Patient Goals: - do exercises in a comfortable position that makes breathing as easy as possible - keep follow-up appointments - avoid second hand smoke - identify and remove indoor air pollutants - limit outdoor activity during cold weather                  Diabetes - Glycemic Management Optimized   On track    Timeframe:  Long-Range Goal Priority:  High Start Date: 05/25/20                             Expected End Date: 05/25/21  Next Follow up date: 12/28/20     Self-Care Activities Self administers oral medications as prescribed Attends all scheduled provider appointments Checks blood sugars as prescribed and utilize hyper and hypoglycemia protocol as needed Adheres to prescribed ADA/carb modified Patient Goals: - check blood sugar at prescribed  times - check blood sugar if I feel it is too high or too low - enter blood sugar readings and medication or insulin into daily log - take the blood sugar log to all doctor visits - take the blood sugar meter to all doctor visits - manage portion size      Diabetes - Obtain Eye Exam       Timeframe:  Long-Range Goal Priority:  Medium Start Date:  05/25/20                          Expected End Date: 05/25/21        Next Follow up date: 12/28/20     Self-Care Activities - Self administers oral medications as prescribed Attends all scheduled provider appointments Checks blood sugars as prescribed and utilize hyper and hypoglycemia protocol as needed Adheres to prescribed ADA/carb modified Patient Goals: - keep appointment with eye doctor - schedule appointment with eye doctor     Diabetes - Obtain Foot Exam       Timeframe:  Long-Range Goal Priority:  Medium Start Date:  05/25/20                           Expected End Date: 05/25/21  Next Follow up date: 12/28/20      Self-Care Activities - Self administers oral medications as prescribed Attends all scheduled provider appointments Checks blood sugars as prescribed and utilize hyper and hypoglycemia protocol as needed Adheres to prescribed ADA/carb modified Patient Goals: - check feet daily for cuts, sores or redness - keep feet up while sitting - trim toenails straight across - wash and dry feet carefully every day - wear comfortable, cotton socks - wear comfortable, well-fitting shoes                       Mixed Hyperlipidemia - disease progression minimized or prevented   On track    Timeframe:  Long-Range Goal Priority:  High Start Date:  05/25/20                           Expected End Date: 05/25/21  Next Follow up date: 12/28/20     Self Care Activities:   Continue to keep all scheduled follow up appointments Take medications as directed  Let your healthcare team know if you are unable to take your medications Call  your pharmacy for refills at least 7 days prior to running out of medication Patient Goals: -  Adhere to dietary and exercise recommendations

## 2020-10-06 ENCOUNTER — Telehealth: Payer: Self-pay

## 2020-10-06 NOTE — Chronic Care Management (AMB) (Addendum)
Patient was listed on gap adherence report for Rybelsus and Metformin. After searching in her chart I found that Metformin was discontinued and Rybelsus was filled on 07-17-2020. Updated gap report.  [place] Pharmacist Assistant [phone]   Confirmed patients medication is being filled through patient assistance, informed patient.   Orlando Penner, PharmD Clinical Pharmacist [facility] [phone]

## 2020-10-10 ENCOUNTER — Telehealth: Payer: Self-pay

## 2020-10-10 NOTE — Chronic Care Management (AMB) (Signed)
Was told to call and follow up on patient's breztri. [employer] automated system stated patient was approved through October 2022. Called patient and she stated she hasn't received medication then called [employer] back. Was on hold for a while so I requested a call back.  [place] Pharmacist Assistant [phone]

## 2020-10-12 ENCOUNTER — Other Ambulatory Visit: Payer: Self-pay

## 2020-10-12 ENCOUNTER — Encounter: Payer: Medicare Other | Attending: Nurse Practitioner | Admitting: Dietician

## 2020-10-12 ENCOUNTER — Encounter: Payer: Self-pay | Admitting: Dietician

## 2020-10-12 DIAGNOSIS — E119 Type 2 diabetes mellitus without complications: Secondary | ICD-10-CM | POA: Insufficient documentation

## 2020-10-12 NOTE — Patient Instructions (Addendum)
Be sure to eat 30 minutes after taking the Rybelsus.             Take the Rybelsus with only 4 oz of water each morning.   Be sure that you are eating enough to keep your strength. Reduce your salt intake.  Season with onions, onion powder, garlic or garlic powder, vinegar or lemon juice.  If you use salt.  Keep this to a sprinkle. Consider leftovers or homemade vegetable soup for lunch.   Continue to drink plenty of water. Continue to be be mindful about the portion size of your treats.  Continue the leg lifts and walking.

## 2020-10-12 NOTE — Progress Notes (Signed)
Patient is here today alone.  She was last seen by this RD on 06/29/14/2022.  Patient noted to have mild swelling in her ankles.   Weight is stable per her records and has decreased 16 lbs in the past 3 months.   She reports decreased appetite and not feeling like eating. She drinks 1 Boost daily. Patient states that overall her energy is better but still has a hard time in the morning due to "flem".   Diabetes Self-Management Education  Visit Type: Follow-up  Appt. Start Time: 1140 Appt. End Time: 1210  10/12/2020  [name], identified by name and date of birth, is a 80 y.o. female with a diagnosis of Diabetes:  .   ASSESSMENT Patient keeps excellent BG and other vial records which shows that fasting glucose is 100-11 and post dinner glucose is 100-118. She reports finding it difficult to drink the 64 oz of water that she reports she was instructed to do. She has started drinking 8 oz of Gatorade per day and feels better doing this. She uses some salt to season. She eats sweets daily but has reduced her portion size and frequency. A1C has reduced to 6.9% Renal function decreased and patient verbalized needing to see a nephrologist. She has started to do a small amount of walking and leg lifts.  History includes Type 2 Diabetes, HTN, HLD, CVA virtigo (Meclazine stopped working well).  Patient is hard of hearing (despite hearing aids) Labs noted to include BUN 11, Creatinine 1.58, Potassium 4.6, eGFR 33 (decreased), A1C 6.9%, cholesterol 174, HDL 45, LDL 88, Triglycerides 247 (decreased) 09/25/2020  eGFR 56 10/19/19, cholesterol 328, HDL 51, LDL 197, Triglycerides 391 10/19/19, A1C 7% 10/19/19 Medications include:  Rybelsus   Weight hx:  191 lbs 07/13/2020 Weight 191 lbs 04/21/2020 5'7" Goal:  165 lbs   Patient's oldest son lives with her.  She is retired from [employer].  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 She has an Environmental consultant that helps  occasionally.  Her son does a lot of the shopping and she cooks. She states that she cannot exercise due to poor energy and shortness of breath. Silver Sneakers on line most days in the past. Has problems chewing. She enjoys paint by number. She makes wine (likes it sweet) and drinks 4 ounces at times.  Weight 175 lb (79.4 kg). Body mass index is 28.42 kg/m.   Diabetes Self-Management Education - 10/12/20 1501       Visit Information   Visit Type Follow-up      Pre-Education Assessment   Patient understands the diabetes disease and treatment process. Demonstrates understanding / competency    Patient understands incorporating nutritional management into lifestyle. Needs Review    Patient undertands incorporating physical activity into lifestyle. Needs Review    Patient understands using medications safely. Demonstrates understanding / competency    Patient understands monitoring blood glucose, interpreting and using results Demonstrates understanding / competency    Patient understands prevention, detection, and treatment of acute complications. Demonstrates understanding / competency    Patient understands prevention, detection, and treatment of chronic complications. Demonstrates understanding / competency    Patient understands how to develop strategies to address psychosocial issues. Demonstrates understanding / competency    Patient understands how to develop strategies to promote health/change behavior. Needs Review      Complications   Last HgB A1C per patient/outside source 6.9 %   09/25/20   How often do you check your  blood sugar? 1-2 times/day    Fasting Blood glucose range (mg/dL) 70-129    Postprandial Blood glucose range (mg/dL) 70-129    Number of hypoglycemic episodes per month 0      Dietary Intake   Breakfast instant oatemal or cheerios, fruit, boost    Lunch vegetable smoothie (kale, spinach, cabbage, apple or other fruit, almond milk sea moss) OR cheese and  crackers and celery and carrot juice    Snack (afternoon) 1/2 cup ice cream or cookie or popsickle    Dinner baked or occasionally fried fish or chicken, greens, french fries   stops eating at 6 pm   Beverage(s) water, coffee with regular french vanilla creamer, dandelion lemon ginger tea with sea moss or 1 T tea      Exercise   Exercise Type Light (walking / raking leaves)   walking or leg lifts     Patient Education   Previous Diabetes Education Yes (please comment)   07/12/2020   Nutrition management  Other (comment);Meal options for control of blood glucose level and chronic complications.   reducing sodium   Physical activity and exercise  Helped patient identify appropriate exercises in relation to his/her diabetes, diabetes complications and other health issue.    Monitoring Taught/evaluated SMBG meter.      Individualized Goals (developed by patient)   Nutrition General guidelines for healthy choices and portions discussed    Physical Activity Exercise 3-5 times per week;15 minutes per day    Medications take my medication as prescribed    Monitoring  test my blood glucose as discussed    Reducing Risk examine blood glucose patterns      Patient Self-Evaluation of Goals - Patient rates self as meeting previously set goals (% of time)   Nutrition >75%    Physical Activity >75%    Medications >75%    Monitoring >75%    Problem Solving >75%    Reducing Risk >75%    Health Coping >75%      Post-Education Assessment   Patient understands the diabetes disease and treatment process. Demonstrates understanding / competency    Patient understands incorporating nutritional management into lifestyle. Needs Review    Patient undertands incorporating physical activity into lifestyle. Demonstrates understanding / competency    Patient understands using medications safely. Demonstrates understanding / competency    Patient understands monitoring blood glucose, interpreting and using  results Demonstrates understanding / competency    Patient understands prevention, detection, and treatment of acute complications. Demonstrates understanding / competency    Patient understands prevention, detection, and treatment of chronic complications. Demonstrates understanding / competency    Patient understands how to develop strategies to address psychosocial issues. Demonstrates understanding / competency    Patient understands how to develop strategies to promote health/change behavior. Needs Review      Outcomes   Expected Outcomes Demonstrated interest in learning. Expect positive outcomes    Future DMSE PRN    Program Status Completed      Subsequent Visit   Since your last visit have you experienced any weight changes? Loss    Weight Loss (lbs) 16             Individualized Plan for Diabetes Self-Management Training:   Learning Objective:  Patient will have a greater understanding of diabetes self-management. Patient education plan is to attend individual and/or group sessions per assessed needs and concerns.   Plan:   Patient Instructions  Be sure to eat 30 minutes after taking  the Rybelsus.             Take the Rybelsus with only 4 oz of water each morning.   Be sure that you are eating enough to keep your strength. Reduce your salt intake.  Season with onions, onion powder, garlic or garlic powder, vinegar or lemon juice.  If you use salt.  Keep this to a sprinkle. Consider leftovers or homemade vegetable soup for lunch.   Continue to drink plenty of water. Continue to be be mindful about the portion size of your treats.  Continue the leg lifts and walking. Expected Outcomes:  Demonstrated interest in learning. Expect positive outcomes  Education material provided:   If problems or questions, patient to contact team via:  Phone  Future DSME appointment: PRN

## 2020-10-18 ENCOUNTER — Telehealth: Payer: Medicare Other

## 2020-10-18 ENCOUNTER — Telehealth: Payer: Self-pay

## 2020-10-18 NOTE — Telephone Encounter (Signed)
  Care Management   Follow Up Note   10/18/2020 Name: Donna Gilmore MRN: [id number] DOB: 16-Mar-1940   Referred by: [name], FNP Reason for referral : Chronic Care Management (Unsuccessful call)   An unsuccessful telephone outreach was attempted today. The patient was referred to the case management team for assistance with care management and care coordination.   Follow Up Plan: The care management team will reach out to the patient again over the next 21 days.   Daneen Schick, BSW, CDP Social Worker, Certified Dementia Practitioner [place] [facility] [phone]

## 2020-10-19 DIAGNOSIS — H2512 Age-related nuclear cataract, left eye: Secondary | ICD-10-CM | POA: Diagnosis not present

## 2020-10-19 LAB — HM DIABETES EYE EXAM

## 2020-10-20 ENCOUNTER — Ambulatory Visit (HOSPITAL_COMMUNITY): Payer: Medicare Other

## 2020-10-24 ENCOUNTER — Ambulatory Visit (HOSPITAL_COMMUNITY): Payer: Medicare Other

## 2020-10-25 ENCOUNTER — Telehealth: Payer: Self-pay

## 2020-10-25 NOTE — Telephone Encounter (Signed)
The pt was notified that Laurance Flatten, DNP, FNP-BC said to take 1/2 tablet of Benicar / olmesartan to help protect her kidneys. That it was a recommendation form Dr. Wilford Sports.

## 2020-10-26 ENCOUNTER — Telehealth: Payer: Self-pay

## 2020-10-26 ENCOUNTER — Ambulatory Visit (HOSPITAL_COMMUNITY): Payer: Medicare Other

## 2020-10-26 NOTE — Chronic Care Management (AMB) (Signed)
Chronic Care Management Pharmacy Assistant   Name: Donna Gilmore  MRN: [id number] DOB: Nov 04, 1940   Reason for Encounter: Disease State/ Diabetes  Recent office visits:  09-25-2020 Donna Gilmore, Donna Gilmore. Referral placed to Nephrology. A1C= 6.9, Creatinine= 1.58, eGFR= 33, BUN/Creatinine= 7. Trig= 247, VLDL= 41.  09-27-2020 [name], Donna Stapler, [name]  10-04-2020 [name], Donna Stapler, [name]  Recent consult visits:  10-12-2020 Donna Gilmore, Donna (Nutrition and diabetes)  Hospital visits:  None in previous 6 months  Medications: Outpatient Encounter Medications as of 10/26/2020  Medication Sig   acetaminophen (TYLENOL) 500 MG tablet Take 1 tablet (500 mg total) by mouth every 6 (six) hours as needed.   albuterol (PROAIR HFA) 108 (90 Base) MCG/ACT inhaler Inhale 2 puffs into the lungs every 6 (six) hours as needed for wheezing or shortness of breath.   albuterol (VENTOLIN HFA) 108 (90 Base) MCG/ACT inhaler Inhale 2 puffs into the lungs every 6 (six) hours as needed for wheezing or shortness of breath.   atorvastatin (LIPITOR) 80 MG tablet TAKE 1 TABLET BY MOUTH  DAILY   Azelastine HCl 137 MCG/SPRAY SOLN Place 2 sprays into the nose daily.   Budeson-Glycopyrrol-Formoterol (BREZTRI AEROSPHERE) 160-9-4.8 MCG/ACT AERO USE 2 INHALATIONS BY MOUTH  DAILY   cholecalciferol (VITAMIN D3) 25 MCG (1000 UNIT) tablet Take 1,000 Units by mouth daily.   clopidogrel (PLAVIX) 75 MG tablet TAKE 1 TABLET BY MOUTH  DAILY   diazepam (VALIUM) 5 MG tablet Take 5 mg by mouth as needed for anxiety.   diclofenac Sodium (VOLTAREN) 1 % GEL Apply topically 4 (four) times daily.   donepezil (ARICEPT) 5 MG tablet TAKE 1 TABLET BY MOUTH  DAILY   gabapentin (NEURONTIN) 100 MG capsule TAKE 1 CAPSULE BY MOUTH 3  TIMES DAILY AS NEEDED   Ginkgo Biloba 40 MG TABS Take by mouth.   glucose blood (ONETOUCH ULTRA) test strip USE WITH METER TO CHECK  BLOOD SUGAR BEFORE  BREAKFAST AND BEFORE DINNER dx: e11.65   glucose blood  (ONETOUCH ULTRA) test strip USE WITH METER TO CHECK  BLOOD SUGAR BEFORE  BREAKFAST AND BEFORE DINNER   levocetirizine (XYZAL) 5 MG tablet Take 1 tablet (5 mg total) by mouth every evening.   magic mouthwash w/lidocaine SOLN Take 5 mLs by mouth 3 (three) times daily. 1 part benadryl, 1 part nystatin and 1 part lidocaine   meclizine (ANTIVERT) 25 MG tablet TAKE 1 TABLET BY MOUTH 2  TIMES EVERY DAY AS NEEDED   olmesartan-hydrochlorothiazide (BENICAR HCT) 40-12.5 MG tablet Take 1 tablet by mouth daily.   OVER THE COUNTER MEDICATION    Probiotic Product (PROBIOTIC-10 PO) Take by mouth. Take one tablet daily   RYBELSUS 7 MG TABS TAKE 1 TABLET BY MOUTH  DAILY   Spacer/Aero-Holding Chambers DEVI 1 each by Does not apply route. Please dispense spacer. DX: J44.9   vitamin B-12 (CYANOCOBALAMIN) 100 MCG tablet Take 100 mcg by mouth daily.   vitamin C (ASCORBIC ACID) 500 MG tablet Take 500 mg by mouth daily.   VITAMIN D PO Take 1 tablet by mouth daily.   Facility-Administered Encounter Medications as of 10/26/2020  Medication   0.9 %  sodium chloride infusion   Recent Relevant Labs: Lab Results  Component Value Date/Time   HGBA1C 6.9 (H) 09/25/2020 11:33 AM   HGBA1C 7.5 (H) 06/27/2020 04:33 PM   MICROALBUR 30 07/26/2020 04:28 PM   MICROALBUR 10 06/30/2019 11:47 AM    Kidney Function Lab Results  Component  Value Date/Time   CREATININE 1.58 (H) 09/25/2020 11:33 AM   CREATININE 1.13 (H) 04/18/2020 04:38 PM   GFRNONAA 48 (L) 10/19/2019 04:19 PM   GFRAA 56 (L) 10/19/2019 04:19 PM    Current antihyperglycemic regimen:  Rybelsus 7 mg daily  What recent interventions/DTPs have been made to improve glycemic control:  Educated on A1c and blood sugar goals Exercise goal of 150 minutes per week Prevention and management of hypoglycemic episodes Benefits of routine self-monitoring of blood sugar Counseled to check feet daily and get yearly eye exams  Have there been any recent hospitalizations or ED  visits since last visit with CPP? No  Patient denies hypoglycemic symptoms  Patient denies hyperglycemic symptoms  How often are you checking your blood sugar? twice daily  What are your blood sugars ranging?  Fasting: 94, 101, 96 Before meals: None After meals: 114, 93 Bedtime: None  During the week, how often does your blood glucose drop below 70? Never  Are you checking your feet daily/regularly? Patient states daily  Adherence Review: Is the patient currently on a STATIN medication? Yes Is the patient currently on ACE/ARB medication? Yes Does the patient have >5 day gap between last estimated fill dates? Yes  NOTES: Patient's Breztri inhaler was approved and shipped on 10-24-2020. Tried getting a representative on the phone to ask if this was the first fill of medication. Patient was instructed by [name] on 10-25-2020 to take 1/2 tablet of Olmesartan. Patient isn't sure if its the combination medication. Messaged [name] about Olmesartan.   Care Gaps: Shingrix overdue Covid booster overdue last completed 11-23-2019 Flu vaccine overdue last completed 10-19-2019 10-19-2020 cataract surgery on left eye  Star Rating Drugs: Atorvastatin 80 MG- Last filled 09-28-2020 90 DS [place] 40-12.5 MG- Last filled 05-24-2020 90 DS [name] 7 MG- PAP   [name] Clinical Pharmacist Assistant [phone]

## 2020-10-27 DIAGNOSIS — E782 Mixed hyperlipidemia: Secondary | ICD-10-CM

## 2020-10-27 DIAGNOSIS — E1169 Type 2 diabetes mellitus with other specified complication: Secondary | ICD-10-CM

## 2020-10-27 DIAGNOSIS — N1831 Chronic kidney disease, stage 3a: Secondary | ICD-10-CM

## 2020-10-27 DIAGNOSIS — J449 Chronic obstructive pulmonary disease, unspecified: Secondary | ICD-10-CM | POA: Diagnosis not present

## 2020-10-27 DIAGNOSIS — I10 Essential (primary) hypertension: Secondary | ICD-10-CM | POA: Diagnosis not present

## 2020-10-30 ENCOUNTER — Telehealth: Payer: Self-pay

## 2020-10-30 NOTE — Chronic Care Management (AMB) (Signed)
10/30/2020- Called patient to reschedule 10/31/2020 visit due to Pharm D being out of the office on vacation. Spoke with patient, rescheduled appointment to 12/05/2020 at 10:00 AM.  Pattricia Boss, [place] Pharmacist Assistant [phone]

## 2020-10-31 ENCOUNTER — Ambulatory Visit (HOSPITAL_COMMUNITY): Payer: Medicare Other

## 2020-10-31 ENCOUNTER — Telehealth: Payer: Medicare Other

## 2020-11-02 ENCOUNTER — Ambulatory Visit (HOSPITAL_COMMUNITY): Payer: Medicare Other

## 2020-11-07 ENCOUNTER — Ambulatory Visit (HOSPITAL_COMMUNITY): Payer: Medicare Other

## 2020-11-08 ENCOUNTER — Telehealth: Payer: Self-pay

## 2020-11-08 ENCOUNTER — Telehealth: Payer: Medicare Other

## 2020-11-08 NOTE — Telephone Encounter (Signed)
  Care Management   Follow Up Note   11/08/2020 Name: Donna Gilmore MRN: [id number] DOB: [birth year]   Referred by: [name], FNP Reason for referral : Chronic Care Management (Unsuccessful call)   A second unsuccessful telephone outreach was attempted today. The patient was referred to the case management team for assistance with care management and care coordination. SW left a HIPAA compliant voice message requesting a return call.  Follow Up Plan: The care management team will reach out to the patient again over the next 21 days.   Daneen Schick, BSW, CDP Social Worker, Certified Dementia Practitioner [place] [facility] [place] [facility] [phone]

## 2020-11-09 ENCOUNTER — Ambulatory Visit (HOSPITAL_COMMUNITY): Payer: Medicare Other

## 2020-11-13 ENCOUNTER — Encounter: Payer: Self-pay | Admitting: Nurse Practitioner

## 2020-11-13 ENCOUNTER — Ambulatory Visit (INDEPENDENT_AMBULATORY_CARE_PROVIDER_SITE_OTHER): Payer: Medicare Other | Admitting: Nurse Practitioner

## 2020-11-13 ENCOUNTER — Other Ambulatory Visit: Payer: Self-pay

## 2020-11-13 VITALS — BP 130/72 | HR 73 | Temp 98.2°F | Ht 65.8 in | Wt 171.6 lb

## 2020-11-13 DIAGNOSIS — I10 Essential (primary) hypertension: Secondary | ICD-10-CM

## 2020-11-13 DIAGNOSIS — E1159 Type 2 diabetes mellitus with other circulatory complications: Secondary | ICD-10-CM

## 2020-11-13 DIAGNOSIS — E782 Mixed hyperlipidemia: Secondary | ICD-10-CM | POA: Diagnosis not present

## 2020-11-13 DIAGNOSIS — E1169 Type 2 diabetes mellitus with other specified complication: Secondary | ICD-10-CM

## 2020-11-13 DIAGNOSIS — R0981 Nasal congestion: Secondary | ICD-10-CM

## 2020-11-13 NOTE — Patient Instructions (Signed)

## 2020-11-13 NOTE — Progress Notes (Signed)
I,Tianna Badgett,acting as a Education administrator for [employer], FNP.,have documented all relevant documentation on the behalf of [name], FNP,as directed by  [name], FNP while in the presence of [name], [place].  This visit occurred during the SARS-CoV-2 public health emergency.  Safety protocols were in place, including screening questions prior to the visit, additional usage of staff PPE, and extensive cleaning of exam room while observing appropriate contact time as indicated for disinfecting solutions.  Subjective:     Patient ID: Donna Gilmore , female    DOB: 1940-04-30 , 80 y.o.   MRN: [id number]   No chief complaint on file.   HPI  She is here to follow up on HTN and DM.  She is blending all of her foods due to her teeth she is to see the dentist.   Wt Readings from Last 3 Encounters: 11/13/20 : 171 lb 9.6 oz (77.8 kg) 10/12/20 : 175 lb (79.4 kg) 09/25/20 : 175 lb (79.4 kg)    Diabetes She presents for her follow-up diabetic visit. She has type 2 diabetes mellitus. Pertinent negatives for hypoglycemia include no dizziness or headaches. Associated symptoms include fatigue. Pertinent negatives for diabetes include no chest pain. Risk factors for coronary artery disease include sedentary lifestyle and obesity. Current diabetic treatment includes oral agent (monotherapy). There is no change in her home blood glucose trend.  Hypertension This is a chronic problem. The current episode started more than 1 year ago. The problem is unchanged. The problem is controlled. Pertinent negatives include no chest pain, headaches or palpitations. Risk factors for coronary artery disease include sedentary lifestyle and obesity. Past treatments include angiotensin blockers and diuretics. The current treatment provides significant improvement. There are no compliance problems.  There is no history of angina or kidney disease. Identifiable causes of hypertension include chronic renal disease (stage  3).    Past Medical History:  Diagnosis Date   Benign paroxysmal positional vertigo 08/26/2012   CVA (cerebral infarction) 08/25/2012   Diabetes mellitus without complication ([place])    Dyslipidemia 08/25/2012   Essential hypertension, benign 08/25/2012   Hyperlipidemia    Hypertension    Meralgia paraesthetica, left 01/12/2018   Stroke [facility])      Family History  Problem Relation Age of Onset   Kidney failure Mother    Diabetes Mother    Alzheimer's disease Mother    Alzheimer's disease Father    Prostate cancer Father    Diabetes Maternal Grandmother    Diabetes Maternal Grandfather    Alzheimer's disease Paternal Grandmother    Alzheimer's disease Paternal Grandfather    Prostate cancer Paternal Uncle      Current Outpatient Medications:    acetaminophen (TYLENOL) 500 MG tablet, Take 1 tablet (500 mg total) by mouth every 6 (six) hours as needed., Disp: 30 tablet, Rfl: 0   albuterol (VENTOLIN HFA) 108 (90 Base) MCG/ACT inhaler, Inhale 2 puffs into the lungs every 6 (six) hours as needed for wheezing or shortness of breath., Disp: 8 g, Rfl: 6   atorvastatin (LIPITOR) 80 MG tablet, TAKE 1 TABLET BY MOUTH  DAILY, Disp: 90 tablet, Rfl: 3   Azelastine HCl 137 MCG/SPRAY SOLN, Place 2 sprays into the nose daily., Disp: 30 mL, Rfl: 2   Budeson-Glycopyrrol-Formoterol (BREZTRI AEROSPHERE) 160-9-4.8 MCG/ACT AERO, USE 2 INHALATIONS BY MOUTH  DAILY, Disp: 32.1 g, Rfl: 2   cholecalciferol (VITAMIN D3) 25 MCG (1000 UNIT) tablet, Take 1,000 Units by mouth daily., Disp: , Rfl:    clopidogrel (  PLAVIX) 75 MG tablet, TAKE 1 TABLET BY MOUTH  DAILY, Disp: 90 tablet, Rfl: 3   diazepam (VALIUM) 5 MG tablet, Take 5 mg by mouth as needed for anxiety., Disp: , Rfl:    diclofenac Sodium (VOLTAREN) 1 % GEL, Apply topically 4 (four) times daily., Disp: , Rfl:    donepezil (ARICEPT) 5 MG tablet, TAKE 1 TABLET BY MOUTH  DAILY, Disp: 90 tablet, Rfl: 3   gabapentin (NEURONTIN) 100 MG capsule, TAKE 1 CAPSULE BY  MOUTH 3  TIMES DAILY AS NEEDED, Disp: 270 capsule, Rfl: 3   Ginkgo Biloba 40 MG TABS, Take by mouth., Disp: , Rfl:    glucose blood (ONETOUCH ULTRA) test strip, USE WITH METER TO CHECK  BLOOD SUGAR BEFORE  BREAKFAST AND BEFORE DINNER dx: e11.65, Disp: 300 strip, Rfl: 3   glucose blood (ONETOUCH ULTRA) test strip, USE WITH METER TO CHECK  BLOOD SUGAR BEFORE  BREAKFAST AND BEFORE DINNER, Disp: 200 strip, Rfl: 3   levocetirizine (XYZAL) 5 MG tablet, Take 1 tablet (5 mg total) by mouth every evening., Disp: 90 tablet, Rfl: 1   magic mouthwash w/lidocaine SOLN, Take 5 mLs by mouth 3 (three) times daily. 1 part benadryl, 1 part nystatin and 1 part lidocaine, Disp: 120 mL, Rfl: 0   meclizine (ANTIVERT) 25 MG tablet, TAKE 1 TABLET BY MOUTH 2  TIMES EVERY DAY AS NEEDED, Disp: 90 tablet, Rfl: 1   olmesartan-hydrochlorothiazide (BENICAR HCT) 40-12.5 MG tablet, Take 1 tablet by mouth daily. (Patient taking differently: Take 1 tablet by mouth daily. Take 1/2), Disp: 90 tablet, Rfl: 3   OVER THE COUNTER MEDICATION, , Disp: , Rfl:    Probiotic Product (PROBIOTIC-10 PO), Take by mouth. Take one tablet daily, Disp: , Rfl:    RYBELSUS 7 MG TABS, TAKE 1 TABLET BY MOUTH  DAILY, Disp: 90 tablet, Rfl: 3   Spacer/Aero-Holding Chambers DEVI, 1 each by Does not apply route. Please dispense spacer. DX: J44.9, Disp: , Rfl:    vitamin B-12 (CYANOCOBALAMIN) 100 MCG tablet, Take 100 mcg by mouth daily., Disp: , Rfl:    vitamin C (ASCORBIC ACID) 500 MG tablet, Take 500 mg by mouth daily., Disp: , Rfl:    VITAMIN D PO, Take 1 tablet by mouth daily., Disp: , Rfl:   Current Facility-Administered Medications:    0.9 %  sodium chloride infusion, 500 mL, Intravenous, Once, [name], MD   No Known Allergies   Review of Systems  Constitutional:  Positive for fatigue.  HENT:  Positive for congestion.   Respiratory: Negative.    Cardiovascular:  Negative for chest pain, palpitations and leg swelling.  Neurological:  Negative  for dizziness and headaches.  Psychiatric/Behavioral: Negative.      Today's Vitals   11/13/20 1133  BP: 130/72  Pulse: 73  Temp: 98.2 F (36.8 C)  TempSrc: Oral  Weight: 171 lb 9.6 oz (77.8 kg)  Height: 5' 5.8" (1.671 m)   Body mass index is 27.87 kg/m.   Objective:  Physical Exam Vitals reviewed.  Constitutional:      General: She is not in acute distress.    Appearance: Normal appearance.  Cardiovascular:     Rate and Rhythm: Normal rate and regular rhythm.     Pulses: Normal pulses.     Heart sounds: Normal heart sounds. No murmur heard. Pulmonary:     Effort: Pulmonary effort is normal. No respiratory distress.     Breath sounds: Normal breath sounds. No wheezing.  Skin:  General: Skin is warm and dry.     Capillary Refill: Capillary refill takes less than 2 seconds.  Neurological:     General: No focal deficit present.     Mental Status: She is alert and oriented to person, place, and time.     Cranial Nerves: No cranial nerve deficit.     Motor: No weakness.  Psychiatric:        Mood and Affect: Mood normal.        Behavior: Behavior normal.        Thought Content: Thought content normal.        Judgment: Judgment normal.        Assessment And Plan:     1. Essential hypertension Comments: Blood pressure is better controlled. Continue current medications  2. Type 2 diabetes mellitus with other circulatory complication, without long-term current use of insulin (HCC) Comments: HgbA1c is improving and she is doing well with Rybelsus  3. Mixed hyperlipidemia  4. Nasal congestion Comments: Discussed with her the symptoms of asthma and encouraged to use inhaler regularly and to use her nasal spray     Patient was given opportunity to ask questions. Patient verbalized understanding of the plan and was able to repeat key elements of the plan. All questions were answered to their satisfaction.  [name], FNP   I, [name], FNP, have reviewed all  documentation for this visit. The documentation on 11/14/20 for the exam, diagnosis, procedures, and orders are all accurate and complete.   IF YOU HAVE BEEN REFERRED TO A SPECIALIST, IT MAY TAKE 1-2 WEEKS TO SCHEDULE/PROCESS THE REFERRAL. IF YOU HAVE NOT HEARD FROM US/SPECIALIST IN TWO WEEKS, PLEASE GIVE [place] A CALL AT [phone].   THE PATIENT IS ENCOURAGED TO PRACTICE SOCIAL DISTANCING DUE TO THE COVID-19 PANDEMIC.

## 2020-11-14 ENCOUNTER — Ambulatory Visit (HOSPITAL_COMMUNITY): Payer: Medicare Other

## 2020-11-15 ENCOUNTER — Telehealth (HOSPITAL_COMMUNITY): Payer: Self-pay

## 2020-11-15 NOTE — Telephone Encounter (Signed)
Pt is not interested at the moment in pulmonary rehab, she stated that she does not have enough energy. I advised pt that if anything changes to call us back. Closed referral.

## 2020-11-16 ENCOUNTER — Ambulatory Visit (HOSPITAL_COMMUNITY): Payer: Medicare Other

## 2020-11-20 ENCOUNTER — Telehealth: Payer: Medicare Other

## 2020-11-20 ENCOUNTER — Telehealth: Payer: Self-pay

## 2020-11-20 NOTE — Telephone Encounter (Signed)
  Care Management   Follow Up Note   11/20/2020 Name: Donna Gilmore MRN: [id number] DOB: 12/17/1940   Referred by: [name], FNP Reason for referral : Chronic Care Management (Unsuccessful call)   Third unsuccessful telephone outreach was attempted today. The patient was referred to the case management team for assistance with care management and care coordination. The patient's primary care provider has been notified of our unsuccessful attempts to make or maintain contact with the patient. The care management team is pleased to engage with this patient at any time in the future should he/she be interested in assistance from the care management team.   Follow Up Plan: A HIPPA compliant phone message was left for the patient providing contact information and requesting a return call.  SW will plan a case closure in the next 14 days if a return call is not received.  Daneen Schick, BSW, CDP Social Worker, Certified Dementia Practitioner [place] [facility] [place] [facility] [phone]

## 2020-11-21 ENCOUNTER — Telehealth: Payer: Self-pay

## 2020-11-21 ENCOUNTER — Ambulatory Visit (HOSPITAL_COMMUNITY): Payer: Medicare Other

## 2020-11-21 NOTE — Chronic Care Management (AMB) (Signed)
Chronic Care Management Pharmacy Assistant   Name: Donna Gilmore  MRN: [id number] DOB: [birth year]   Reason for Encounter: Disease State/ Hypertension  Recent office visits:  11-13-2020 [name], [place]. Follow up  Recent consult visits:  None  Hospital visits:  None in previous 6 months  Medications: Outpatient Encounter Medications as of 11/21/2020  Medication Sig   acetaminophen (TYLENOL) 500 MG tablet Take 1 tablet (500 mg total) by mouth every 6 (six) hours as needed.   albuterol (VENTOLIN HFA) 108 (90 Base) MCG/ACT inhaler Inhale 2 puffs into the lungs every 6 (six) hours as needed for wheezing or shortness of breath.   atorvastatin (LIPITOR) 80 MG tablet TAKE 1 TABLET BY MOUTH  DAILY   Azelastine HCl 137 MCG/SPRAY SOLN Place 2 sprays into the nose daily.   Budeson-Glycopyrrol-Formoterol (BREZTRI AEROSPHERE) 160-9-4.8 MCG/ACT AERO USE 2 INHALATIONS BY MOUTH  DAILY   cholecalciferol (VITAMIN D3) 25 MCG (1000 UNIT) tablet Take 1,000 Units by mouth daily.   clopidogrel (PLAVIX) 75 MG tablet TAKE 1 TABLET BY MOUTH  DAILY   diazepam (VALIUM) 5 MG tablet Take 5 mg by mouth as needed for anxiety.   diclofenac Sodium (VOLTAREN) 1 % GEL Apply topically 4 (four) times daily.   donepezil (ARICEPT) 5 MG tablet TAKE 1 TABLET BY MOUTH  DAILY   gabapentin (NEURONTIN) 100 MG capsule TAKE 1 CAPSULE BY MOUTH 3  TIMES DAILY AS NEEDED   Ginkgo Biloba 40 MG TABS Take by mouth.   glucose blood (ONETOUCH ULTRA) test strip USE WITH METER TO CHECK  BLOOD SUGAR BEFORE  BREAKFAST AND BEFORE DINNER dx: e11.65   glucose blood (ONETOUCH ULTRA) test strip USE WITH METER TO CHECK  BLOOD SUGAR BEFORE  BREAKFAST AND BEFORE DINNER   levocetirizine (XYZAL) 5 MG tablet Take 1 tablet (5 mg total) by mouth every evening.   magic mouthwash w/lidocaine SOLN Take 5 mLs by mouth 3 (three) times daily. 1 part benadryl, 1 part nystatin and 1 part lidocaine   meclizine (ANTIVERT) 25 MG tablet TAKE 1 TABLET BY  MOUTH 2  TIMES EVERY DAY AS NEEDED   olmesartan-hydrochlorothiazide (BENICAR HCT) 40-12.5 MG tablet Take 1 tablet by mouth daily. (Patient taking differently: Take 1 tablet by mouth daily. Take 1/2)   OVER THE COUNTER MEDICATION    Probiotic Product (PROBIOTIC-10 PO) Take by mouth. Take one tablet daily   RYBELSUS 7 MG TABS TAKE 1 TABLET BY MOUTH  DAILY   Spacer/Aero-Holding Chambers DEVI 1 each by Does not apply route. Please dispense spacer. DX: J44.9   vitamin B-12 (CYANOCOBALAMIN) 100 MCG tablet Take 100 mcg by mouth daily.   vitamin C (ASCORBIC ACID) 500 MG tablet Take 500 mg by mouth daily.   VITAMIN D PO Take 1 tablet by mouth daily.   Facility-Administered Encounter Medications as of 11/21/2020  Medication   0.9 %  sodium chloride infusion  Reviewed chart prior to disease state call. Spoke with patient regarding BP  Recent Office Vitals: BP Readings from Last 3 Encounters:  11/13/20 130/72  09/25/20 130/72  09/04/20 126/72   Pulse Readings from Last 3 Encounters:  11/13/20 73  09/25/20 70  09/04/20 76    Wt Readings from Last 3 Encounters:  11/13/20 171 lb 9.6 oz (77.8 kg)  10/12/20 175 lb (79.4 kg)  09/25/20 175 lb (79.4 kg)     Kidney Function Lab Results  Component Value Date/Time   CREATININE 1.58 (H) 09/25/2020 11:33 AM   CREATININE 1.13 (  H) 04/18/2020 04:38 PM   GFRNONAA 48 (L) 10/19/2019 04:19 PM   GFRAA 56 (L) 10/19/2019 04:19 PM    BMP Latest Ref Rng & Units 09/25/2020 04/18/2020 10/19/2019  Glucose 65 - 99 mg/dL 98 76 82  BUN 8 - 27 mg/dL 11 12 10   Creatinine 0.57 - 1.00 mg/dL 1.58(H) 1.13(H) 1.09(H)  BUN/Creat Ratio 12 - 28 7(L) 11(L) 9(L)  Sodium 134 - 144 mmol/L 141 138 143  Potassium 3.5 - 5.2 mmol/L 4.6 4.2 4.2  Chloride 96 - 106 mmol/L 103 99 102  CO2 20 - 29 mmol/L 22 23 23   Calcium 8.7 - 10.3 mg/dL 9.9 9.8 10.0    Current antihypertensive regimen:  Olmesartan-HCTZ 40-12.5 MG half tablet daily  How often are you checking your Blood  Pressure? daily  Current home BP readings: 129/79, 128/73, 127/74  What recent interventions/DTPs have been made by any provider to improve Blood Pressure control since last CPP Visit:  Educated on BP goals and benefits of medications for prevention of heart attack, stroke and kidney damage; Daily salt intake goal < 2300 mg; Importance of home blood pressure monitoring;  Any recent hospitalizations or ED visits since last visit with CPP? No  What diet changes have been made to improve Blood Pressure Control?  Patient states she works with a nutritionist that's been helping. Patient states she doesn't have much of an appetite but eats vegetables/fruits and air fries everything. Patient states she has cut back on sweets. Patient states she is working on getting dentures to help her eat better.  What exercise is being done to improve your Blood Pressure Control?  Patient states she doesn't exercise do to leg pain but she tries to walk some.  Adherence Review: Is the patient currently on ACE/ARB medication? Yes Does the patient have >5 day gap between last estimated fill dates? Yes  NOTES: Patient states she is having some dizziness and diarrhea which started when she started mucinex. Patient inquired about getting pull ups to wear at night through her insurance and if she should continue with medication. Informed patient that I will send a message to [name] about her concerns and will follow up Monday. Patient states blood sugars have been normal (99,111,100)  Care Gaps: Shingrix overdue Covid booster overdue last completed 11-23-2019 Flu vaccine overdue last completed 10-19-2019 10-19-2020 cataract surgery on left eye  Star Rating Drugs: Rybelsus 7 MG- PAP  Atorvastatin 80 MG- Last filled 09-28-2020 90 DS [place] 40-12.5 MG- Last filled 05-24-2020 90 DS [place] Clinical Pharmacist Assistant [phone]

## 2020-11-23 ENCOUNTER — Ambulatory Visit (HOSPITAL_COMMUNITY): Payer: Medicare Other

## 2020-11-28 ENCOUNTER — Ambulatory Visit (HOSPITAL_COMMUNITY): Payer: Medicare Other

## 2020-11-30 ENCOUNTER — Ambulatory Visit (HOSPITAL_COMMUNITY): Payer: Medicare Other

## 2020-12-04 ENCOUNTER — Telehealth: Payer: Medicare Other

## 2020-12-04 ENCOUNTER — Ambulatory Visit (INDEPENDENT_AMBULATORY_CARE_PROVIDER_SITE_OTHER): Payer: Medicare Other

## 2020-12-04 DIAGNOSIS — E1122 Type 2 diabetes mellitus with diabetic chronic kidney disease: Secondary | ICD-10-CM | POA: Diagnosis not present

## 2020-12-04 DIAGNOSIS — N390 Urinary tract infection, site not specified: Secondary | ICD-10-CM | POA: Diagnosis not present

## 2020-12-04 DIAGNOSIS — N1832 Chronic kidney disease, stage 3b: Secondary | ICD-10-CM | POA: Diagnosis not present

## 2020-12-04 DIAGNOSIS — I10 Essential (primary) hypertension: Secondary | ICD-10-CM

## 2020-12-04 DIAGNOSIS — N1831 Chronic kidney disease, stage 3a: Secondary | ICD-10-CM

## 2020-12-04 DIAGNOSIS — Z8673 Personal history of transient ischemic attack (TIA), and cerebral infarction without residual deficits: Secondary | ICD-10-CM | POA: Diagnosis not present

## 2020-12-04 DIAGNOSIS — E1159 Type 2 diabetes mellitus with other circulatory complications: Secondary | ICD-10-CM

## 2020-12-04 DIAGNOSIS — I129 Hypertensive chronic kidney disease with stage 1 through stage 4 chronic kidney disease, or unspecified chronic kidney disease: Secondary | ICD-10-CM | POA: Diagnosis not present

## 2020-12-04 DIAGNOSIS — J449 Chronic obstructive pulmonary disease, unspecified: Secondary | ICD-10-CM

## 2020-12-04 NOTE — Patient Instructions (Signed)
Social Worker Visit Information  Goals we discussed today:   Goals Addressed             This Visit's Progress    COMPLETED: Healthy Nutrition Achieved       Timeframe:  Long-Range Goal Priority:  High Start Date:   5.25.22                          Expected End Date:  8.23.22                     Goal closed due to inability to maintain patient contact  Patient Goals/Self-Care Activities patient will:   - Review mailed resource information     COMPLETED: Quality of Life Maintained       Timeframe:  Long-Range Goal Priority:  Medium Start Date:  5.25.22                           Expected End Date: 9.22.22                       Goal closed due to inability to maintain patient contact   Patient Goals/Self-Care Activities patient will:   - Attend Pulmonary rehab when scheduling is available -Contact SW as needed prior to next scheduled call         Materials Provided: No. Patient not reached.  Follow Up Plan:  No SW follow up planned at this time.   Daneen Schick, BSW, CDP Social Worker, Certified Dementia Practitioner [place] [facility] [place] [facility] [phone]

## 2020-12-04 NOTE — Chronic Care Management (AMB) (Signed)
Care Management   Follow Up Note   12/04/2020 Name: Donna Gilmore MRN: [id number] DOB: 05/21/1940   Referred by: Moore, Janece, FNP Reason for referral : Chronic Care Management (SW closure)   Donna Gilmore is a 80 y.o. year old female who is a primary care patient of Moore, Janece, FNP. The CCM team was consulted for assistance with chronic disease management and care coordination needs related to HTN, COPD, DM, and CKD Stage III.  SW performed a case closure performed due to placing three unsuccessful calls to the patient without receiving a return call.  Review of patient status, including review of consultants reports, relevant laboratory and other test results, and collaboration with appropriate care team members and the patient's provider was performed as part of comprehensive patient evaluation and provision of chronic care management services.      Medications: Outpatient Encounter Medications as of 12/04/2020  Medication Sig   acetaminophen (TYLENOL) 500 MG tablet Take 1 tablet (500 mg total) by mouth every 6 (six) hours as needed.   albuterol (VENTOLIN HFA) 108 (90 Base) MCG/ACT inhaler Inhale 2 puffs into the lungs every 6 (six) hours as needed for wheezing or shortness of breath.   atorvastatin (LIPITOR) 80 MG tablet TAKE 1 TABLET BY MOUTH  DAILY   Azelastine HCl 137 MCG/SPRAY SOLN Place 2 sprays into the nose daily.   Budeson-Glycopyrrol-Formoterol (BREZTRI AEROSPHERE) 160-9-4.8 MCG/ACT AERO USE 2 INHALATIONS BY MOUTH  DAILY   cholecalciferol (VITAMIN D3) 25 MCG (1000 UNIT) tablet Take 1,000 Units by mouth daily.   clopidogrel (PLAVIX) 75 MG tablet TAKE 1 TABLET BY MOUTH  DAILY   diazepam (VALIUM) 5 MG tablet Take 5 mg by mouth as needed for anxiety.   diclofenac Sodium (VOLTAREN) 1 % GEL Apply topically 4 (four) times daily.   donepezil (ARICEPT) 5 MG tablet TAKE 1 TABLET BY MOUTH  DAILY   gabapentin (NEURONTIN) 100 MG capsule TAKE 1 CAPSULE BY MOUTH 3  TIMES DAILY AS  NEEDED   Ginkgo Biloba 40 MG TABS Take by mouth.   glucose blood (ONETOUCH ULTRA) test strip USE WITH METER TO CHECK  BLOOD SUGAR BEFORE  BREAKFAST AND BEFORE DINNER dx: e11.65   glucose blood (ONETOUCH ULTRA) test strip USE WITH METER TO CHECK  BLOOD SUGAR BEFORE  BREAKFAST AND BEFORE DINNER   levocetirizine (XYZAL) 5 MG tablet Take 1 tablet (5 mg total) by mouth every evening.   magic mouthwash w/lidocaine SOLN Take 5 mLs by mouth 3 (three) times daily. 1 part benadryl, 1 part nystatin and 1 part lidocaine   meclizine (ANTIVERT) 25 MG tablet TAKE 1 TABLET BY MOUTH 2  TIMES EVERY DAY AS NEEDED   olmesartan-hydrochlorothiazide (BENICAR HCT) 40-12.5 MG tablet Take 1 tablet by mouth daily. (Patient taking differently: Take 1 tablet by mouth daily. Take 1/2)   OVER THE COUNTER MEDICATION    Probiotic Product (PROBIOTIC-10 PO) Take by mouth. Take one tablet daily   RYBELSUS 7 MG TABS TAKE 1 TABLET BY MOUTH  DAILY   Spacer/Aero-Holding Chambers DEVI 1 each by Does not apply route. Please dispense spacer. DX: J44.9   vitamin B-12 (CYANOCOBALAMIN) 100 MCG tablet Take 100 mcg by mouth daily.   vitamin C (ASCORBIC ACID) 500 MG tablet Take 500 mg by mouth daily.   VITAMIN D PO Take 1 tablet by mouth daily.   Facility-Administered Encounter Medications as of 12/04/2020  Medication   0.9 %  sodium chloride infusion     Objective:     Patient Care Plan: Social Work [facility] Plan of Care  Completed 12/04/2020   Problem Identified: Healthy Nutrition (Wellness) Resolved 12/04/2020     Long-Range Goal: Healthy Nutrition Achieved Completed 12/04/2020  Start Date: 06/21/2020  Expected End Date: 09/19/2020  Priority: High  Note:   Current Barriers:  Chronic disease management support and education needs related to HTN, COPD, DM, and CKD Stage III   Limited access to food - patient reports concern over ability to afford food. Her son lives with her and assists with some costs  Social Worker Clinical Goal(s):   patient will work with SW to identify food resources  SW Interventions:  Inter-disciplinary care team collaboration (see longitudinal plan of care) Collaboration with [name], [place] regarding development and update of comprehensive plan of care as evidenced by provider attestation and co-signature Three unsuccessful calls placed to the patient to assess goal progression without a return call received Performed SW closure due to inability to maintain patient contact  Patient Goals/Self-Care Activities patient will:   -  Review mailed resource information      Problem Identified: Quality of Life (General Plan of Care) Resolved 12/04/2020     Long-Range Goal: Quality of Life Maintained Completed 12/04/2020  Start Date: 06/21/2020  Expected End Date: 10/19/2020  Recent Progress: On track  Priority: Medium  Note:   Current Barriers:  Chronic disease management support and education needs related to HTN, COPD, DM, and CKD Stage III   Limited ability to afford the cost of heat Ongoing concerns of allergic reaction when in her own home Difficulty performing iADL's  Social Worker Clinical Goal(s):  patient will work with SW to identify and address any acute and/or chronic care coordination needs related to the self health management of HTN, COPD, DM, and CKD Stage III Patient will work with SW to become more knowledgeable of resources to assist with utility costs Patient will work with SW to identify resources for home inspection to rule out mold in the home  Patient will work with SW to be placed on the [facility] in home aid wait list for assistance with light housekeeping Goal Met 5.27.22  SW Interventions:  Inter-disciplinary care team collaboration (see longitudinal plan of care) Collaboration with [name], FNP regarding development and update of comprehensive plan of care as evidenced by provider attestation and co-signature Three unsuccessful calls placed to the  patient to assist with care coordination needs without a return call SW goal closure due to inability to maintain patient contact  Patient Goals/Self-Care Activities patient will:   -  Attend Pulmonary rehab when scheduling is available -Contact SW as needed        Follow Up Plan:  No SW follow up planned at this time. The patient will remain engaged with RN Care Manager.  Daneen Schick, BSW, CDP Social Worker, Certified Dementia Practitioner [place] [facility] [place] [facility] [phone]

## 2020-12-05 ENCOUNTER — Ambulatory Visit (HOSPITAL_COMMUNITY): Payer: Medicare Other

## 2020-12-05 ENCOUNTER — Ambulatory Visit: Payer: Medicare Other

## 2020-12-05 DIAGNOSIS — I10 Essential (primary) hypertension: Secondary | ICD-10-CM

## 2020-12-05 DIAGNOSIS — E782 Mixed hyperlipidemia: Secondary | ICD-10-CM

## 2020-12-05 DIAGNOSIS — E1159 Type 2 diabetes mellitus with other circulatory complications: Secondary | ICD-10-CM

## 2020-12-05 NOTE — Progress Notes (Signed)
Chronic Care Management Pharmacy Note  12/12/2020 Name:  Donna Gilmore MRN:  [id number] DOB:  21-Aug-1940  Summary: Patient reports that she is doing well with her Rybelsus medication   Recommendations/Changes made from today's visit: Recommend patient receive COVID-19 booster vaccine   Plan: Patient to receive COVID-19 booster vaccine    Subjective: Donna Gilmore is an 80 y.o. year old female who is a primary patient of [name], [place].  The CCM team was consulted for assistance with disease management and care coordination needs.    Engaged with patient by telephone for follow up visit in response to provider referral for pharmacy case management and/or care coordination services. Patient reports that she is doing well, she is taking care of her [place] grand baby who is 48 years old, and the other one is 41 year old. Her parents have gone to see the gender of the baby.   Consent to Services:  The patient was given information about Chronic Care Management services, agreed to services, and gave verbal consent prior to initiation of services.  Please see initial visit note for detailed documentation.   Patient Care Team: [name], FNP as PCP - General (General Practice) [name], FNP (General Practice) Rex Kras [name], RN as [place] Management  Recent office visits: 11/13/2020 PCP OV 09/25/2020 PCP OV   Recent consult visits: 09/04/2020 Pulmonology [facility] visits: None in previous 6 months   Objective:  Lab Results  Component Value Date   CREATININE 1.58 (H) 09/25/2020   BUN 11 09/25/2020   GFRNONAA 48 (L) 10/19/2019   GFRAA 56 (L) 10/19/2019   NA 141 09/25/2020   K 4.6 09/25/2020   CALCIUM 9.9 09/25/2020   CO2 22 09/25/2020   GLUCOSE 98 09/25/2020    Lab Results  Component Value Date/Time   HGBA1C 6.9 (H) 09/25/2020 11:33 AM   HGBA1C 7.5 (H) 06/27/2020 04:33 PM   MICROALBUR 30 07/26/2020 04:28 PM   MICROALBUR 10  06/30/2019 11:47 AM    Last diabetic Eye exam:  Lab Results  Component Value Date/Time   HMDIABEYEEXA No Retinopathy 09/01/2019 12:00 AM    Last diabetic Foot exam: No results found for: HMDIABFOOTEX   Lab Results  Component Value Date   CHOL 174 09/25/2020   HDL 45 09/25/2020   LDLCALC 88 09/25/2020   TRIG 247 (H) 09/25/2020   CHOLHDL 3.9 09/25/2020    Hepatic Function Latest Ref Rng & Units 09/25/2020 04/18/2020 10/19/2019  Total Protein 6.0 - 8.5 g/dL 7.2 7.4 7.3  Albumin 3.7 - 4.7 g/dL 4.3 4.4 4.6  AST 0 - 40 IU/L 18 26 26   ALT 0 - 32 IU/L 14 18 30   Alk Phosphatase 44 - 121 IU/L 114 105 131(H)  Total Bilirubin 0.0 - 1.2 mg/dL 0.2 0.2 0.3    Lab Results  Component Value Date/Time   TSH 3.600 04/18/2020 04:38 PM   TSH 2.800 10/26/2018 10:50 AM   FREET4 0.95 08/13/2013 08:15 AM    CBC Latest Ref Rng & Units 09/25/2020 04/18/2020 07/21/2019  WBC 3.4 - 10.8 x10E3/uL 7.4 8.0 6.1  Hemoglobin 11.1 - 15.9 g/dL 11.1 11.2 11.2  Hematocrit 34.0 - 46.6 % 34.1 33.6(L) 34.1  Platelets 150 - 450 x10E3/uL 261 258 240    No results found for: VD25OH  Clinical ASCVD: Yes  The ASCVD Risk score (Arnett DK, et al., 2019) failed to calculate for the following reasons:   The 2019 ASCVD risk score  is only valid for ages 52 to 78   The patient has a prior MI or stroke diagnosis    Depression screen [facility] 2/9 07/26/2020 04/21/2020 06/30/2019  Decreased Interest 0 0 0  Down, Depressed, Hopeless 0 0 0  PHQ - 2 Score 0 0 0  Altered sleeping - - -  Tired, decreased energy - - -  Change in appetite - - -  Feeling bad or failure about yourself  - - -  Trouble concentrating - - -  Moving slowly or fidgety/restless - - -  Suicidal thoughts - - -  PHQ-9 Score - - -     Social History   Tobacco Use  Smoking Status Former   Packs/day: 0.30   Years: 30.00   Pack years: 9.00   Types: Cigarettes   Quit date: 2019   Years since quitting: 3.8  Smokeless Tobacco Never   BP Readings from Last 3  Encounters:  11/13/20 130/72  09/25/20 130/72  09/04/20 126/72   Pulse Readings from Last 3 Encounters:  11/13/20 73  09/25/20 70  09/04/20 76   Wt Readings from Last 3 Encounters:  11/13/20 171 lb 9.6 oz (77.8 kg)  10/12/20 175 lb (79.4 kg)  09/25/20 175 lb (79.4 kg)   BMI Readings from Last 3 Encounters:  11/13/20 27.87 kg/m  10/12/20 28.42 kg/m  09/25/20 28.42 kg/m    Assessment/Interventions: Review of patient past medical history, allergies, medications, health status, including review of consultants reports, laboratory and other test data, was performed as part of comprehensive evaluation and provision of chronic care management services.   SDOH:  (Social Determinants of Health) assessments and interventions performed: No  SDOH Screenings   Alcohol Screen: Not on file  Depression (PHQ2-9): Low Risk    PHQ-2 Score: 0  Financial Resource Strain: Low Risk    Difficulty of Paying Living Expenses: Not hard at all  Food Insecurity: No Food Insecurity   Worried About Charity fundraiser in the Last Year: Never true   Ran Out of Food in the Last Year: Never true  Housing: Low Risk    Last Housing Risk Score: 0  Physical Activity: Inactive   Days of Exercise per Week: 0 days   Minutes of Exercise per Session: 0 min  Social Connections: Not on file  Stress: No Stress Concern Present   Feeling of Stress : Not at all  Tobacco Use: Medium Risk   Smoking Tobacco Use: Former   Smokeless Tobacco Use: Never   Passive Exposure: Not on file  Transportation Needs: No Transportation Needs   Lack of Transportation (Medical): No   Lack of Transportation (Non-Medical): No    CCM Care Plan  No Known Allergies  Medications Reviewed Today     Reviewed by Mayford Knife, RPH (Pharmacist) on 12/05/20 at 1106  Med List Status: <None>   Medication Order Taking? Sig Documenting Provider Last Dose Status Informant  0.9 %  sodium chloride infusion [id number]   [name], MD   Active   acetaminophen (TYLENOL) 500 MG tablet [id number] No Take 1 tablet (500 mg total) by mouth every 6 (six) hours as needed. Chase Picket, MD Taking Active   albuterol (VENTOLIN HFA) 108 (90 Base) MCG/ACT inhaler [id number] No Inhale 2 puffs into the lungs every 6 (six) hours as needed for wheezing or shortness of breath. Rigoberto Noel, MD Taking Active   atorvastatin (LIPITOR) 80 MG tablet [id number]  TAKE 1 TABLET BY MOUTH  DAILY [name], FNP  Active   Azelastine HCl 137 MCG/SPRAY SOLN [id number] No Place 2 sprays into the nose daily. [name], FNP Taking Active   Budeson-Glycopyrrol-Formoterol [facility] AEROSPHERE) 160-9-4.8 MCG/ACT Hollie Salk [id number] No USE 2 INHALATIONS BY MOUTH  DAILY [name], FNP Taking Active   cholecalciferol (VITAMIN D3) 25 MCG (1000 UNIT) tablet [id number] No Take 1,000 Units by mouth daily. [provider] Taking Active   clopidogrel (PLAVIX) 75 MG tablet [id number] No TAKE 1 TABLET BY MOUTH  DAILY [name], FNP Taking Active   diazepam (VALIUM) 5 MG tablet [id number] No Take 5 mg by mouth as needed for anxiety. [provider] Taking Active   diclofenac Sodium (VOLTAREN) 1 % GEL [id number] No Apply topically 4 (four) times daily. [provider] Taking Active   donepezil (ARICEPT) 5 MG tablet [id number] No TAKE 1 TABLET BY MOUTH  DAILY [name], FNP Taking Active   gabapentin (NEURONTIN) 100 MG capsule [id number] No TAKE 1 CAPSULE BY MOUTH 3  TIMES DAILY AS NEEDED [name], FNP Taking Active   Ginkgo Biloba 40 MG TABS [id number] No Take by mouth. [provider] Taking Active Self  glucose blood (ONETOUCH ULTRA) test strip [id number] No USE WITH METER TO CHECK  BLOOD SUGAR BEFORE  BREAKFAST AND BEFORE DINNER dx: e11.65 [name], FNP Taking Active   glucose blood (ONETOUCH ULTRA) test strip [id number] No USE WITH METER TO CHECK  BLOOD SUGAR BEFORE  BREAKFAST AND BEFORE Andrey Spearman, Doreene Burke, FNP Taking Active    levocetirizine (XYZAL) 5 MG tablet [id number] No Take 1 tablet (5 mg total) by mouth every evening. [name], FNP Taking Active   magic mouthwash w/lidocaine SOLN [id number] No Take 5 mLs by mouth 3 (three) times daily. 1 part benadryl, 1 part nystatin and 1 part lidocaine [name], FNP Taking Active   meclizine (ANTIVERT) 25 MG tablet [id number] No TAKE 1 TABLET BY MOUTH 2  TIMES EVERY DAY AS NEEDED [name], FNP Taking Active   olmesartan-hydrochlorothiazide (BENICAR HCT) 40-12.5 MG tablet [id number] No Take 1 tablet by mouth daily.  Patient taking differently: Take 1 tablet by mouth daily. Take 1/2   [name], FNP Taking Active   OVER THE COUNTER MEDICATION [id number] No  [provider] Taking Active   Probiotic Product (PROBIOTIC-10 PO) [id number] No Take by mouth. Take one tablet daily [provider] Taking Active   RYBELSUS 7 MG TABS [id number] No TAKE 1 TABLET BY MOUTH  DAILY [name], FNP Taking Active   Spacer/Aero-Holding [facility] [id number] No 1 each by Does not apply route. Please dispense spacer. DX: J44.9 [provider] Taking Active   vitamin B-12 (CYANOCOBALAMIN) 100 MCG tablet [id number] No Take 100 mcg by mouth daily. [provider] Taking Active   vitamin C (ASCORBIC ACID) 500 MG tablet [id number] No Take 500 mg by mouth daily. [provider] Taking Active Self  VITAMIN D PO [id number] No Take 1 tablet by mouth daily. [provider] Taking Active             Patient Active Problem List   Diagnosis Date Noted   Chronic rhinitis 09/04/2020   COPD with asthma ([place]) 02/25/2019   Peripheral vascular disease ([place]) 02/25/2019   Chronic bilateral low back pain without sciatica 06/01/2018   Class 1 obesity due to excess calories without serious comorbidity with body mass index (BMI) of 32.0 to 32.9 in adult 06/01/2018   Acute left-sided low back pain with left-sided  sciatica 05/04/2018   Shortness  of breath 05/04/2018   Meralgia paraesthetica, left 01/12/2018   Mild cognitive impairment 10/14/2016   History of stroke 10/14/2016   History of syphilis 10/14/2016   Mixed hyperlipidemia 10/14/2016   Insomnia 10/14/2016   Cerebellar stroke syndrome 10/23/2013   Smoker 02/16/2013   Chest pain 09/21/2012   Tobacco abuse disorder 09/21/2012   Benign paroxysmal positional vertigo 08/26/2012   CVA (cerebral infarction) 08/25/2012   Essential hypertension 08/25/2012   Dyslipidemia 08/25/2012    Immunization History  Administered Date(s) Administered   Fluad Quad(high Dose 65+) 10/19/2019   Influenza, High Dose Seasonal PF 11/05/2017   Influenza-Unspecified 10/06/2018   Moderna Sars-Covid-2 Vaccination 02/23/2019, 03/23/2019, 11/23/2019   Pneumococcal Conjugate-13 07/07/2018   Pneumococcal Polysaccharide-23 05/16/2020    Conditions to be addressed/monitored:  Hypertension, Hyperlipidemia, and Diabetes  Care Plan : [place]  Updates made by Mayford Knife, [place] since 12/12/2020 12:00 AM     Problem: HTN, HLD, DM II   Priority: High     Goal: Disease Management   Recent Progress: On track  Priority: High  Note:    Current Barriers:  Unable to achieve control of lipids   Pharmacist Clinical Goal(s):  Patient will achieve adherence to monitoring guidelines and medication adherence to achieve therapeutic efficacy through collaboration with PharmD and provider.   Interventions: 1:1 collaboration with [name], FNP regarding development and update of comprehensive plan of care as evidenced by provider attestation and co-signature Inter-disciplinary care team collaboration (see longitudinal plan of care) Comprehensive medication review performed; medication list updated in electronic medical record  Hypertension (BP goal <130/80) -Controlled -Current treatment: Olmesartan-Hydrochlorothiazide 40-12.5 mg tablet one per day Taking a 1/2 tablet by mouth  daily -Current home readings: 120/79 -Current dietary habits: patient is avoiding limiting high salt foods  -Current exercise habits: will discuss further during next office visit  -Denies hypotensive/hypertensive symptoms -Educated on BP goals and benefits of medications for prevention of heart attack, stroke and kidney damage; Importance of home blood pressure monitoring; -Counseled to monitor BP at home at least two times per week, document, and provide log at future appointments -Recommended to continue current medication  Hyperlipidemia: (LDL goal < 70) -Not ideally controlled -Current treatment: Atorvastatin 80 mg tablet once per day  -Current dietary patterns: will discuss further during next office visit -Current exercise habits: she is taking care of her grandchildren frequently  -Educated on Cholesterol goals;  Benefits of statin for ASCVD risk reduction; Importance of limiting foods high in cholesterol; -Recommended to continue current medication  Diabetes (A1c goal <8%) -Controlled -Current medications: Rybelsus 7 mg - taking 1 tablet by mouth daily  -Current home glucose readings fasting glucose: 118  -Denies hypoglycemic/hyperglycemic symptoms -Current meal patterns: follow up on current diet. She has lost over thirty pounds.  -She is scheduled in 4 months to go back to her kidney doctor.  -Educated on A1c and blood sugar goals; -Counseled to check feet daily and get yearly eye exams -Recommended to continue current medication  Health Maintenance -Vaccine gaps: COVID-19 Booster Vaccine - last shots were at [facility] and Influenza vaccine  -Patient is going to schedule COVID-19 booster vaccine at [place] on the importance of COVID-19 booster.   Patient Goals/Self-Care Activities Patient will:  - take medications as prescribed as evidenced by patient report and record review  Follow Up Plan: The patient has been provided with contact  information for the care management team and has  been advised to call with any health related questions or concerns.       Medication Assistance: None required.  Patient affirms current coverage meets needs.  Compliance/Adherence/Medication fill history: Care Gaps: Shingrix Vaccine COVID-19 Vaccine Influenza Vaccine  Ophthalmology Exam  Star-Rating Drugs: Atorvastatin 80 mg tablet Olmesartan-Hydrochlorothiazide 40-12.5 mg tablet  Rybelsus 7 mg tablet    Patient's preferred pharmacy is:  [facility], [address] [place] [address] Phone: [phone] Fax: [phone]  [facility] ([place], [place] [facility] [place] [address] Phone: [phone] Fax: [phone]  [facility] ([facility]) - [place], [address] [place] [address] Phone: [phone] Fax: [phone]  Uses pill box? Yes Pt endorses 95% compliance  We discussed: Benefits of medication synchronization, packaging and delivery as well as enhanced pharmacist oversight with [employer]. Patient decided to: Continue current medication management strategy  Care Plan and Follow Up Patient Decision:  Patient agrees to Care Plan and Follow-up.  Plan: The patient has been provided with contact information for the care management team and has been advised to call with any health related questions or concerns.   Orlando Penner, CPP, PharmD Clinical Pharmacist Practitioner [facility] [phone]

## 2020-12-06 ENCOUNTER — Other Ambulatory Visit: Payer: Self-pay | Admitting: Nephrology

## 2020-12-06 DIAGNOSIS — N1832 Chronic kidney disease, stage 3b: Secondary | ICD-10-CM

## 2020-12-07 ENCOUNTER — Ambulatory Visit (HOSPITAL_COMMUNITY): Payer: Medicare Other

## 2020-12-12 ENCOUNTER — Ambulatory Visit (HOSPITAL_COMMUNITY): Payer: Medicare Other

## 2020-12-12 NOTE — Patient Instructions (Addendum)
Visit Information It was great speaking with you today!  Please let me know if you have any questions about our visit.   Goals Addressed             This Visit's Progress    Manage My Medicine       Timeframe:  Long-Range Goal Priority:  High Start Date:                             Expected End Date:                       Follow Up Date 03/14/2021  In Progress:  - call for medicine refill 2 or 3 days before it runs out - call if I am sick and can't take my medicine - keep a list of all the medicines I take; vitamins and herbals too - use an alarm clock or phone to remind me to take my medicine    Why is this important?   These steps will help you keep on track with your medicines.           Patient Care Plan: CCM Pharmacy Care Plan     Problem Identified: HTN, HLD, DM II   Priority: High     Goal: Disease Management   Recent Progress: On track  Priority: High  Note:    Current Barriers:  Unable to achieve control of lipids   Pharmacist Clinical Goal(s):  Patient will achieve adherence to monitoring guidelines and medication adherence to achieve therapeutic efficacy through collaboration with PharmD and provider.   Interventions: 1:1 collaboration with [name], FNP regarding development and update of comprehensive plan of care as evidenced by provider attestation and co-signature Inter-disciplinary care team collaboration (see longitudinal plan of care) Comprehensive medication review performed; medication list updated in electronic medical record  Hypertension (BP goal <130/80) -Controlled -Current treatment: Olmesartan-Hydrochlorothiazide 40-12.5 mg tablet one per day Taking a 1/2 tablet by mouth daily -Current home readings: 120/79 -Current dietary habits: patient is avoiding limiting high salt foods  -Current exercise habits: will discuss further during next office visit  -Denies hypotensive/hypertensive symptoms -Educated on BP goals and  benefits of medications for prevention of heart attack, stroke and kidney damage; Importance of home blood pressure monitoring; -Counseled to monitor BP at home at least two times per week, document, and provide log at future appointments -Recommended to continue current medication  Hyperlipidemia: (LDL goal < 70) -Not ideally controlled -Current treatment: Atorvastatin 80 mg tablet once per day  -Current dietary patterns: will discuss further during next office visit -Current exercise habits: she is taking care of her grandchildren frequently  -Educated on Cholesterol goals;  Benefits of statin for ASCVD risk reduction; Importance of limiting foods high in cholesterol; -Recommended to continue current medication  Diabetes (A1c goal <8%) -Controlled -Current medications: Rybelsus 7 mg - taking 1 tablet by mouth daily  -Current home glucose readings fasting glucose: 118  -Denies hypoglycemic/hyperglycemic symptoms -Current meal patterns: follow up on current diet. She has lost over thirty pounds.  -She is scheduled in 4 months to go back to her kidney doctor.  -Educated on A1c and blood sugar goals; -Counseled to check feet daily and get yearly eye exams -Recommended to continue current medication  Health Maintenance -Vaccine gaps: COVID-19 Booster Vaccine - last shots were at [facility] and Influenza vaccine  -Patient is going to schedule COVID-19 booster vaccine at [facility]  pharmacy  -Counseled on the importance of COVID-19 booster.   Patient Goals/Self-Care Activities Patient will:  - take medications as prescribed as evidenced by patient report and record review  Follow Up Plan: The patient has been provided with contact information for the care management team and has been advised to call with any health related questions or concerns.       Patient agreed to services and verbal consent obtained.   The patient verbalized understanding of instructions, educational  materials, and care plan provided today and agreed to receive a mailed copy of patient instructions, educational materials, and care plan.   Orlando Penner, PharmD Clinical Pharmacist [facility] [phone]

## 2020-12-14 ENCOUNTER — Ambulatory Visit (HOSPITAL_COMMUNITY): Payer: Medicare Other

## 2020-12-19 ENCOUNTER — Ambulatory Visit (HOSPITAL_COMMUNITY): Payer: Medicare Other

## 2020-12-25 ENCOUNTER — Ambulatory Visit
Admission: RE | Admit: 2020-12-25 | Discharge: 2020-12-25 | Disposition: A | Discharge status: Home / Self Care | Payer: Medicare Other | Source: Ambulatory Visit | Attending: Nephrology | Admitting: Nephrology

## 2020-12-25 DIAGNOSIS — N281 Cyst of kidney, acquired: Secondary | ICD-10-CM | POA: Diagnosis not present

## 2020-12-25 DIAGNOSIS — N1832 Chronic kidney disease, stage 3b: Secondary | ICD-10-CM

## 2020-12-26 ENCOUNTER — Ambulatory Visit: Payer: Medicare Other | Admitting: Nurse Practitioner

## 2020-12-27 DIAGNOSIS — I10 Essential (primary) hypertension: Secondary | ICD-10-CM

## 2020-12-27 DIAGNOSIS — N1831 Chronic kidney disease, stage 3a: Secondary | ICD-10-CM | POA: Diagnosis not present

## 2020-12-27 DIAGNOSIS — E782 Mixed hyperlipidemia: Secondary | ICD-10-CM | POA: Diagnosis not present

## 2020-12-27 DIAGNOSIS — J449 Chronic obstructive pulmonary disease, unspecified: Secondary | ICD-10-CM

## 2020-12-27 DIAGNOSIS — E1159 Type 2 diabetes mellitus with other circulatory complications: Secondary | ICD-10-CM

## 2020-12-28 ENCOUNTER — Ambulatory Visit (INDEPENDENT_AMBULATORY_CARE_PROVIDER_SITE_OTHER): Payer: Medicare Other

## 2020-12-28 ENCOUNTER — Telehealth: Payer: Medicare Other

## 2020-12-28 DIAGNOSIS — N1831 Chronic kidney disease, stage 3a: Secondary | ICD-10-CM

## 2020-12-28 DIAGNOSIS — I10 Essential (primary) hypertension: Secondary | ICD-10-CM

## 2020-12-28 DIAGNOSIS — E1159 Type 2 diabetes mellitus with other circulatory complications: Secondary | ICD-10-CM

## 2020-12-28 DIAGNOSIS — E782 Mixed hyperlipidemia: Secondary | ICD-10-CM

## 2020-12-28 DIAGNOSIS — J449 Chronic obstructive pulmonary disease, unspecified: Secondary | ICD-10-CM

## 2020-12-28 NOTE — Patient Instructions (Signed)
Visit Information   Thank you for taking time to visit with me today. Please don't hesitate to contact me if I can be of assistance to you before our next scheduled telephone appointment.  Following are the goals we discussed today:  (Copy and paste patient goals from clinical care plan here)  Our next appointment is by telephone on 02/28/21 at 11:05AM  Please call the care guide team at [phone] if you need to cancel or reschedule your appointment.   Following is a copy of your full care plan:   Care Plan : [place] of Care  Updates made by Lynne Logan, RN since 12/28/2020 12:00 AM     Problem: No Plan of Care established for management of chronic disease states (Essential hypertension, Type 2 diabetes mellitus, history of COPD, Stage 3 a Chronic Kidney disease, Mixed Hyperlipidemia)   Priority: High     Long-Range Goal: Development of plan of care for chronic disease management (Essential hypertension, Type 2 diabetes mellitus, history of COPD, Stage 3 a Chronic Kidney disease, Mixed Hyperlipidemia)   This Visit's Progress: On track  Priority: High  Note:   Current Barriers:  Knowledge Deficits related to plan of care for management of Essential hypertension, Type 2 diabetes mellitus, history of COPD, Stage 3 a Chronic Kidney disease, Mixed Hyperlipidemia    Chronic Disease Management support and education needs related to Essential hypertension, Type 2 diabetes mellitus, history of COPD, Stage 3 a Chronic Kidney disease, Mixed Hyperlipidemia    HOH  RNCM Clinical Goal(s):  Patient will verbalize basic understanding of  Essential hypertension, Type 2 diabetes mellitus, history of COPD, Stage 3 a Chronic Kidney disease, Mixed Hyperlipidemia   disease process and self health management plan as evidenced by patient will report having no disease exacerbations related to chronic disease states listed above take all medications exactly as prescribed and will call provider  for medication related questions as evidenced by patient will report having no missed doses of her prescribed medications demonstrate Improved health management independence as evidenced by patient will maintain the ability to perform self care continue to work with RN Care Manager to address care management and care coordination needs related to  Essential hypertension, Type 2 diabetes mellitus, history of COPD, Stage 3 a Chronic Kidney disease, Mixed Hyperlipidemia   as evidenced by adherence to CM Team Scheduled appointments demonstrate ongoing self health care management ability   as evidenced by    through collaboration with RN Care manager, provider, and care team.   Interventions: 1:1 collaboration with primary care provider regarding development and update of comprehensive plan of care as evidenced by provider attestation and co-signature Inter-disciplinary care team collaboration (see longitudinal plan of care) Evaluation of current treatment plan related to  self management and patient's adherence to plan as established by provider   Chronic Kidney Disease Interventions:  (Status:  Goal on track:  Yes.) Long Term Goal Evaluation of current treatment plan related to chronic kidney disease self management and patient's adherence to plan as established by provider      Reviewed prescribed diet increase water as directed  Reviewed medications with patient and discussed importance of compliance    Assessed social determinant of health barriers    Provided education on kidney disease progression    Discussed plans with patient for ongoing care management follow up and provided patient with direct contact information for care management team Last practice recorded BP readings:  BP Readings from Last 3  Encounters:  11/13/20 130/72  09/25/20 130/72  09/04/20 126/72  Most recent eGFR/CrCl:  Lab Results  Component Value Date   EGFR 33 (L) 09/25/2020    No components found for:  CRCL   Diabetes Interventions:  (Status:  Goal on track:  Yes.) Long Term Goal Assessed patient's understanding of A1c goal: <6.5% Provided education to patient about basic DM disease process Reviewed medications with patient and discussed importance of medication adherence Counseled on importance of regular laboratory monitoring as prescribed Discussed plans with patient for ongoing care management follow up and provided patient with direct contact information for care management team Provided patient with written educational materials related to hypo and hyperglycemia and importance of correct treatment Review of patient status, including review of consultants reports, relevant laboratory and other test results, and medications completed Lab Results  Component Value Date   HGBA1C 6.9 (H) 09/25/2020   COPD Interventions:  (Status:  Goal on track:  Yes.)  Long Term Goal Evaluation of current treatment plan related to COPD, self-management and patient's adherence to plan as established by provider Review of patient status, including review of consultant's reports, relevant laboratory and other test results, and medications completed. Reviewed medications with patient and discussed importance of medication adherence Provided education to patient re: avoiding breathing in cold air when possible by covering nose and mouth when entering cold air/wind  Discussed plans with patient for ongoing care management follow up and provided patient with direct contact information for care management team  Hyperlipidemia Interventions:  (Status:  Goal on track:  Yes.) Long Term Goal Medication review performed; medication list updated in electronic medical record.  Provider established cholesterol goals reviewed Counseled on importance of regular laboratory monitoring as prescribed Provided HLD educational materials Reviewed importance of limiting foods high in cholesterol Discussed plans with patient for  ongoing care management follow up and provided patient with direct contact information for care management team Lipid Panel     Component Value Date/Time   CHOL 174 09/25/2020 1133   TRIG 247 (H) 09/25/2020 1133   HDL 45 09/25/2020 1133   CHOLHDL 3.9 09/25/2020 1133   CHOLHDL 8.7 08/26/2012 0538   VLDL 60 (H) 08/26/2012 0538   LDLCALC 88 09/25/2020 1133   LABVLDL 41 (H) 09/25/2020 1133     Patient Goals/Self-Care Activities: Take all medications as prescribed Attend all scheduled provider appointments Call pharmacy for medication refills 3-7 days in advance of running out of medications Attend church or other social activities Perform all self care activities independently  Perform IADL's (shopping, preparing meals, housekeeping, managing finances) independently Call provider office for new concerns or questions  drink 6 to 8 glasses of water each day manage portion size limit outdoor activity during cold weather take medications for blood pressure exactly as prescribed adhere to prescribed diet: low fat  Follow Up Plan:  Telephone follow up appointment with care management team member scheduled for:  02/28/21      Consent to CCM Services: Ms. Dukes was given information about Chronic Care Management services including:  CCM service includes personalized support from designated clinical staff supervised by her physician, including individualized plan of care and coordination with other care providers 24/7 contact phone numbers for assistance for urgent and routine care needs. Service will only be billed when office clinical staff spend 20 minutes or more in a month to coordinate care. Only one practitioner may furnish and bill the service in a calendar month. The patient may stop CCM services at  any time (effective at the end of the month) by phone call to the office staff. The patient will be responsible for cost sharing (co-pay) of up to 20% of the service fee (after annual  deductible is met).  Patient agreed to services and verbal consent obtained.   Patient verbalizes understanding of instructions provided today and agrees to view in [place].   Telephone follow up appointment with care management team member scheduled for: 02/28/21

## 2020-12-28 NOTE — Chronic Care Management (AMB) (Signed)
Chronic Care Management   CCM RN Visit Note  12/28/2020 Name: Donna Gilmore MRN: [id number] DOB: 1940/03/16  Subjective: Donna Gilmore is a 80 y.o. year old female who is a primary care patient of [name], [place]. The care management team was consulted for assistance with disease management and care coordination needs.    Engaged with patient by telephone for follow up visit in response to provider referral for case management and/or care coordination services.   Consent to Services:  The patient was given information about Chronic Care Management services, agreed to services, and gave verbal consent prior to initiation of services.  Please see initial visit note for detailed documentation.   Patient agreed to services and verbal consent obtained.   Assessment: Review of patient past medical history, allergies, medications, health status, including review of consultants reports, laboratory and other test data, was performed as part of comprehensive evaluation and provision of chronic care management services.   SDOH (Social Determinants of Health) assessments and interventions performed:  Yes, no acute challenges   CCM Care Plan  No Known Allergies  Outpatient Encounter Medications as of 12/28/2020  Medication Sig   acetaminophen (TYLENOL) 500 MG tablet Take 1 tablet (500 mg total) by mouth every 6 (six) hours as needed.   albuterol (VENTOLIN HFA) 108 (90 Base) MCG/ACT inhaler Inhale 2 puffs into the lungs every 6 (six) hours as needed for wheezing or shortness of breath.   atorvastatin (LIPITOR) 80 MG tablet TAKE 1 TABLET BY MOUTH  DAILY   Azelastine HCl 137 MCG/SPRAY SOLN Place 2 sprays into the nose daily.   Budeson-Glycopyrrol-Formoterol (BREZTRI AEROSPHERE) 160-9-4.8 MCG/ACT AERO USE 2 INHALATIONS BY MOUTH  DAILY   cholecalciferol (VITAMIN D3) 25 MCG (1000 UNIT) tablet Take 1,000 Units by mouth daily.   clopidogrel (PLAVIX) 75 MG tablet TAKE 1 TABLET BY MOUTH  DAILY    diazepam (VALIUM) 5 MG tablet Take 5 mg by mouth as needed for anxiety.   diclofenac Sodium (VOLTAREN) 1 % GEL Apply topically 4 (four) times daily.   donepezil (ARICEPT) 5 MG tablet TAKE 1 TABLET BY MOUTH  DAILY   gabapentin (NEURONTIN) 100 MG capsule TAKE 1 CAPSULE BY MOUTH 3  TIMES DAILY AS NEEDED   Ginkgo Biloba 40 MG TABS Take by mouth.   glucose blood (ONETOUCH ULTRA) test strip USE WITH METER TO CHECK  BLOOD SUGAR BEFORE  BREAKFAST AND BEFORE DINNER dx: e11.65   glucose blood (ONETOUCH ULTRA) test strip USE WITH METER TO CHECK  BLOOD SUGAR BEFORE  BREAKFAST AND BEFORE DINNER   levocetirizine (XYZAL) 5 MG tablet Take 1 tablet (5 mg total) by mouth every evening.   magic mouthwash w/lidocaine SOLN Take 5 mLs by mouth 3 (three) times daily. 1 part benadryl, 1 part nystatin and 1 part lidocaine   meclizine (ANTIVERT) 25 MG tablet TAKE 1 TABLET BY MOUTH 2  TIMES EVERY DAY AS NEEDED   olmesartan-hydrochlorothiazide (BENICAR HCT) 40-12.5 MG tablet Take 1 tablet by mouth daily. (Patient taking differently: Take 1 tablet by mouth daily. Take 1/2)   OVER THE COUNTER MEDICATION    Probiotic Product (PROBIOTIC-10 PO) Take by mouth. Take one tablet daily   RYBELSUS 7 MG TABS TAKE 1 TABLET BY MOUTH  DAILY   Spacer/Aero-Holding Chambers DEVI 1 each by Does not apply route. Please dispense spacer. DX: J44.9   vitamin B-12 (CYANOCOBALAMIN) 100 MCG tablet Take 100 mcg by mouth daily.   vitamin C (ASCORBIC ACID) 500 MG tablet Take  500 mg by mouth daily.   VITAMIN D PO Take 1 tablet by mouth daily.   Facility-Administered Encounter Medications as of 12/28/2020  Medication   0.9 %  sodium chloride infusion    Patient Active Problem List   Diagnosis Date Noted   Chronic rhinitis 09/04/2020   COPD with asthma ([place]) 02/25/2019   Peripheral vascular disease ([place]) 02/25/2019   Chronic bilateral low back pain without sciatica 06/01/2018   Class 1 obesity due to excess calories without serious comorbidity  with body mass index (BMI) of 32.0 to 32.9 in adult 06/01/2018   Acute left-sided low back pain with left-sided sciatica 05/04/2018   Shortness of breath 05/04/2018   Meralgia paraesthetica, left 01/12/2018   Mild cognitive impairment 10/14/2016   History of stroke 10/14/2016   History of syphilis 10/14/2016   Mixed hyperlipidemia 10/14/2016   Insomnia 10/14/2016   Cerebellar stroke syndrome 10/23/2013   Smoker 02/16/2013   Chest pain 09/21/2012   Tobacco abuse disorder 09/21/2012   Benign paroxysmal positional vertigo 08/26/2012   CVA (cerebral infarction) 08/25/2012   Essential hypertension 08/25/2012   Dyslipidemia 08/25/2012    Conditions to be addressed/monitored: Essential hypertension, Type 2 diabetes mellitus, history of COPD, Stage 3 a Chronic Kidney disease, Mixed Hyperlipidemia    Care Plan : RN Care Manager Plan of Care  Updates made by Lynne Logan, RN since 12/28/2020 12:00 AM     Problem: No Plan of Care established for management of chronic disease states (Essential hypertension, Type 2 diabetes mellitus, history of COPD, Stage 3 a Chronic Kidney disease, Mixed Hyperlipidemia)   Priority: High     Long-Range Goal: Development of plan of care for chronic disease management (Essential hypertension, Type 2 diabetes mellitus, history of COPD, Stage 3 a Chronic Kidney disease, Mixed Hyperlipidemia)   This Visit's Progress: On track  Priority: High  Note:   Current Barriers:  Knowledge Deficits related to plan of care for management of Essential hypertension, Type 2 diabetes mellitus, history of COPD, Stage 3 a Chronic Kidney disease, Mixed Hyperlipidemia    Chronic Disease Management support and education needs related to Essential hypertension, Type 2 diabetes mellitus, history of COPD, Stage 3 a Chronic Kidney disease, Mixed Hyperlipidemia    HOH  RNCM Clinical Goal(s):  Patient will verbalize basic understanding of  Essential hypertension, Type 2 diabetes  mellitus, history of COPD, Stage 3 a Chronic Kidney disease, Mixed Hyperlipidemia   disease process and self health management plan as evidenced by patient will report having no disease exacerbations related to chronic disease states listed above take all medications exactly as prescribed and will call provider for medication related questions as evidenced by patient will report having no missed doses of her prescribed medications demonstrate Improved health management independence as evidenced by patient will maintain the ability to perform self care continue to work with RN Care Manager to address care management and care coordination needs related to  Essential hypertension, Type 2 diabetes mellitus, history of COPD, Stage 3 a Chronic Kidney disease, Mixed Hyperlipidemia   as evidenced by adherence to CM Team Scheduled appointments demonstrate ongoing self health care management ability   as evidenced by    through collaboration with RN Care manager, provider, and care team.   Interventions: 1:1 collaboration with primary care provider regarding development and update of comprehensive plan of care as evidenced by provider attestation and co-signature Inter-disciplinary care team collaboration (see longitudinal plan of care) Evaluation of current treatment plan related to  self management and patient's adherence to plan as established by provider   Chronic Kidney Disease Interventions:  (Status:  Goal on track:  Yes.) Long Term Goal Evaluation of current treatment plan related to chronic kidney disease self management and patient's adherence to plan as established by provider      Reviewed prescribed diet increase water as directed  Reviewed medications with patient and discussed importance of compliance    Assessed social determinant of health barriers    Provided education on kidney disease progression    Discussed plans with patient for ongoing care management follow up and provided patient  with direct contact information for care management team Last practice recorded BP readings:  BP Readings from Last 3 Encounters:  11/13/20 130/72  09/25/20 130/72  09/04/20 126/72  Most recent eGFR/CrCl:  Lab Results  Component Value Date   EGFR 33 (L) 09/25/2020    No components found for: CRCL   Diabetes Interventions:  (Status:  Goal on track:  Yes.) Long Term Goal Assessed patient's understanding of A1c goal: <6.5% Provided education to patient about basic DM disease process Reviewed medications with patient and discussed importance of medication adherence Counseled on importance of regular laboratory monitoring as prescribed Discussed plans with patient for ongoing care management follow up and provided patient with direct contact information for care management team Provided patient with written educational materials related to hypo and hyperglycemia and importance of correct treatment Review of patient status, including review of consultants reports, relevant laboratory and other test results, and medications completed Lab Results  Component Value Date   HGBA1C 6.9 (H) 09/25/2020   COPD Interventions:  (Status:  Goal on track:  Yes.)  Long Term Goal Evaluation of current treatment plan related to COPD, self-management and patient's adherence to plan as established by provider Review of patient status, including review of consultant's reports, relevant laboratory and other test results, and medications completed. Reviewed medications with patient and discussed importance of medication adherence Provided education to patient re: avoiding breathing in cold air when possible by covering nose and mouth when entering cold air/wind  Discussed plans with patient for ongoing care management follow up and provided patient with direct contact information for care management team  Hyperlipidemia Interventions:  (Status:  Goal on track:  Yes.) Long Term Goal Medication review performed;  medication list updated in electronic medical record.  Provider established cholesterol goals reviewed Counseled on importance of regular laboratory monitoring as prescribed Provided HLD educational materials Reviewed importance of limiting foods high in cholesterol Discussed plans with patient for ongoing care management follow up and provided patient with direct contact information for care management team Lipid Panel     Component Value Date/Time   CHOL 174 09/25/2020 1133   TRIG 247 (H) 09/25/2020 1133   HDL 45 09/25/2020 1133   CHOLHDL 3.9 09/25/2020 1133   CHOLHDL 8.7 08/26/2012 0538   VLDL 60 (H) 08/26/2012 0538   LDLCALC 88 09/25/2020 1133   LABVLDL 41 (H) 09/25/2020 1133     Patient Goals/Self-Care Activities: Take all medications as prescribed Attend all scheduled provider appointments Call pharmacy for medication refills 3-7 days in advance of running out of medications Attend church or other social activities Perform all self care activities independently  Perform IADL's (shopping, preparing meals, housekeeping, managing finances) independently Call provider office for new concerns or questions  drink 6 to 8 glasses of water each day manage portion size limit outdoor activity during cold weather take medications for blood pressure  exactly as prescribed adhere to prescribed diet: low fat  Follow Up Plan:  Telephone follow up appointment with care management team member scheduled for:  02/28/21     Plan:Telephone follow up appointment with care management team member scheduled for:  02/28/21  Barb Merino, RN, BSN, CCM Care Management Coordinator [facility]  Direct Phone: [phone]

## 2021-01-03 ENCOUNTER — Ambulatory Visit: Payer: Medicare Other

## 2021-01-03 DIAGNOSIS — J449 Chronic obstructive pulmonary disease, unspecified: Secondary | ICD-10-CM

## 2021-01-03 DIAGNOSIS — E1159 Type 2 diabetes mellitus with other circulatory complications: Secondary | ICD-10-CM

## 2021-01-03 DIAGNOSIS — I10 Essential (primary) hypertension: Secondary | ICD-10-CM

## 2021-01-03 DIAGNOSIS — N1831 Chronic kidney disease, stage 3a: Secondary | ICD-10-CM

## 2021-01-03 NOTE — Patient Instructions (Signed)
Social Worker Visit Information  Goals we discussed today:   Goals Addressed             This Visit's Progress    Quality of Life Maintained       Timeframe:  Short-Term Goal Priority:  High Start Date:    12.7.22                                             Next planned outreach: 12.28.22  Patient Goals/Self-Care Activities patient will:   - Contact SW as needed prior to next scheduled call         Materials Provided: Verbal education about health plan benefits provided by phone  Patient verbalizes understanding of instructions provided today and agrees to view in [place].   Follow Up Plan: Appointment scheduled for SW follow up with client by phone on: 12.28.22  Daneen Schick, [place], CDP Social Worker, Certified Dementia Practitioner [place] [facility] [place] [facility] [phone]

## 2021-01-03 NOTE — Chronic Care Management (AMB) (Signed)
Chronic Care Management    Social Work Note  01/03/2021 Name: Donna Gilmore MRN: [id number] DOB: 02/27/1940  Donna Gilmore is a 80 y.o. year old female who is a primary care patient of [name], [place]. The CCM team was consulted to assist the patient with chronic disease management and/or care coordination needs related to:  HTN, DM II, COPD, CKD III .   Engaged with patient by telephone for follow up visit in response to provider referral for social work chronic care management and care coordination services.   Consent to Services:  The patient was given information about Chronic Care Management services, agreed to services, and gave verbal consent prior to initiation of services.  Please see initial visit note for detailed documentation.   Patient agreed to services and consent obtained.   Assessment: Review of patient past medical history, allergies, medications, and health status, including review of relevant consultants reports was performed today as part of a comprehensive evaluation and provision of chronic care management and care coordination services.     SDOH (Social Determinants of Health) assessments and interventions performed:    Advanced Directives Status: Not addressed in this encounter.  CCM Care Plan  No Known Allergies  Outpatient Encounter Medications as of 01/03/2021  Medication Sig   acetaminophen (TYLENOL) 500 MG tablet Take 1 tablet (500 mg total) by mouth every 6 (six) hours as needed.   albuterol (VENTOLIN HFA) 108 (90 Base) MCG/ACT inhaler Inhale 2 puffs into the lungs every 6 (six) hours as needed for wheezing or shortness of breath.   atorvastatin (LIPITOR) 80 MG tablet TAKE 1 TABLET BY MOUTH  DAILY   Azelastine HCl 137 MCG/SPRAY SOLN Place 2 sprays into the nose daily.   Budeson-Glycopyrrol-Formoterol (BREZTRI AEROSPHERE) 160-9-4.8 MCG/ACT AERO USE 2 INHALATIONS BY MOUTH  DAILY   cholecalciferol (VITAMIN D3) 25 MCG (1000 UNIT) tablet Take 1,000  Units by mouth daily.   clopidogrel (PLAVIX) 75 MG tablet TAKE 1 TABLET BY MOUTH  DAILY   diazepam (VALIUM) 5 MG tablet Take 5 mg by mouth as needed for anxiety.   diclofenac Sodium (VOLTAREN) 1 % GEL Apply topically 4 (four) times daily.   donepezil (ARICEPT) 5 MG tablet TAKE 1 TABLET BY MOUTH  DAILY   gabapentin (NEURONTIN) 100 MG capsule TAKE 1 CAPSULE BY MOUTH 3  TIMES DAILY AS NEEDED   Ginkgo Biloba 40 MG TABS Take by mouth.   glucose blood (ONETOUCH ULTRA) test strip USE WITH METER TO CHECK  BLOOD SUGAR BEFORE  BREAKFAST AND BEFORE DINNER dx: e11.65   glucose blood (ONETOUCH ULTRA) test strip USE WITH METER TO CHECK  BLOOD SUGAR BEFORE  BREAKFAST AND BEFORE DINNER   levocetirizine (XYZAL) 5 MG tablet Take 1 tablet (5 mg total) by mouth every evening.   magic mouthwash w/lidocaine SOLN Take 5 mLs by mouth 3 (three) times daily. 1 part benadryl, 1 part nystatin and 1 part lidocaine   meclizine (ANTIVERT) 25 MG tablet TAKE 1 TABLET BY MOUTH 2  TIMES EVERY DAY AS NEEDED   olmesartan-hydrochlorothiazide (BENICAR HCT) 40-12.5 MG tablet Take 1 tablet by mouth daily. (Patient taking differently: Take 1 tablet by mouth daily. Take 1/2)   OVER THE COUNTER MEDICATION    Probiotic Product (PROBIOTIC-10 PO) Take by mouth. Take one tablet daily   RYBELSUS 7 MG TABS TAKE 1 TABLET BY MOUTH  DAILY   Spacer/Aero-Holding Chambers DEVI 1 each by Does not apply route. Please dispense spacer. DX: J44.9   vitamin  B-12 (CYANOCOBALAMIN) 100 MCG tablet Take 100 mcg by mouth daily.   vitamin C (ASCORBIC ACID) 500 MG tablet Take 500 mg by mouth daily.   VITAMIN D PO Take 1 tablet by mouth daily.   Facility-Administered Encounter Medications as of 01/03/2021  Medication   0.9 %  sodium chloride infusion    Patient Active Problem List   Diagnosis Date Noted   Chronic rhinitis 09/04/2020   COPD with asthma ([place]) 02/25/2019   Peripheral vascular disease ([place]) 02/25/2019   Chronic bilateral low back pain without  sciatica 06/01/2018   Class 1 obesity due to excess calories without serious comorbidity with body mass index (BMI) of 32.0 to 32.9 in adult 06/01/2018   Acute left-sided low back pain with left-sided sciatica 05/04/2018   Shortness of breath 05/04/2018   Meralgia paraesthetica, left 01/12/2018   Mild cognitive impairment 10/14/2016   History of stroke 10/14/2016   History of syphilis 10/14/2016   Mixed hyperlipidemia 10/14/2016   Insomnia 10/14/2016   Cerebellar stroke syndrome 10/23/2013   Smoker 02/16/2013   Chest pain 09/21/2012   Tobacco abuse disorder 09/21/2012   Benign paroxysmal positional vertigo 08/26/2012   CVA (cerebral infarction) 08/25/2012   Essential hypertension 08/25/2012   Dyslipidemia 08/25/2012    Conditions to be addressed/monitored: HTN, COPD, DMII, and CKD Stage III ;  Limited understanding of health plan benefits  Care Plan : Social Work Plan of Care  Updates made by Daneen Schick since 01/03/2021 12:00 AM     Problem: Quality of Life (General Plan of Care)      Goal: Quality of Life Maintained   Start Date: 01/03/2021  This Visit's Progress: On track  Priority: High  Note:   Current Barriers:  Chronic disease management support and education needs related to  HTN, DM II, COPD, CKD III   Limited understanding of health plan benefit Limited access to a caregiver  Social Worker Clinical Goal(s):  patient will work with SW to identify and address any acute and/or chronic care coordination needs related to the self health management of  HTN, DMII, COPD, CKD III   patient will work with SW to address concerns related to care coordination needs  SW Interventions:  Inter-disciplinary care team collaboration (see longitudinal plan of care) Collaboration with [name], FNP regarding development and update of comprehensive plan of care as evidenced by provider attestation and co-signature Collaboration with RN Care Manager who requests SW assistance  with care coordination needs related to a caregiver and food resources Telephonic visit completed with the patient who reports she keeps seeing commercials that older adults may qualify for a food card and she is questioning if this is something her health plan covers Advised the patient that older adults with both Medicare and Medicaid may have access to a health food card, however her health plan does not offer that Patient reports she would like a caregiver in the home to assist with light housekeeping but is unsure how to obtain one Discussed this SW has assisted with this goal in the past and placed the patient on the wait list for the in home aid program in May of 2022 - reviewed this program usually has a wait list consisting of 18 months or longer Advised the patient other options for a caregiver in the home included private pay or PCS under Medicaid benefit - patient reports she does not think she will qualify for Medicaid. SW advised the patient she may apply if she would like,  patient declined application at this time Discussed patient ordered from her health plans over the counter catalog in the past and did not receive order Assisted the patient in placing a call to her health plan to obtain over the counter benefit information - OTC benefit is offered under [place] [phone] Joint call placed to [place] to determine the last order patient placed was in July of 2022 - unfortunately you must report you have not received items within 30 days in order for the company to investigate Determined the patient currently has a $50 credit under this benefit - patient placed an order for desired items (order # [id number]) which should arrive by December 19 Scheduled follow up call to the patient to confirm receipt of order  Patient Goals/Self-Care Activities patient will:   -  Contact SW as needed prior to next scheduled call  Follow Up Plan: Telephone follow up appointment  with care management team member scheduled for:12.28       Follow Up Plan: Appointment scheduled for SW follow up with client by phone on: 12.28.22      Daneen Schick, [place], CDP Social Worker, Certified Dementia Practitioner [place] [facility] [place] [facility] [phone]

## 2021-01-04 ENCOUNTER — Other Ambulatory Visit: Payer: Self-pay | Admitting: Nurse Practitioner

## 2021-01-04 DIAGNOSIS — I10 Essential (primary) hypertension: Secondary | ICD-10-CM

## 2021-01-09 ENCOUNTER — Ambulatory Visit: Payer: Medicare Other | Admitting: Nurse Practitioner

## 2021-01-16 ENCOUNTER — Encounter: Payer: Self-pay | Admitting: Nurse Practitioner

## 2021-01-16 ENCOUNTER — Other Ambulatory Visit: Payer: Self-pay

## 2021-01-16 ENCOUNTER — Ambulatory Visit (INDEPENDENT_AMBULATORY_CARE_PROVIDER_SITE_OTHER): Payer: Medicare Other | Admitting: Nurse Practitioner

## 2021-01-16 VITALS — BP 126/62 | HR 71 | Temp 98.6°F | Ht 62.8 in | Wt 176.2 lb

## 2021-01-16 DIAGNOSIS — Z6831 Body mass index (BMI) 31.0-31.9, adult: Secondary | ICD-10-CM

## 2021-01-16 DIAGNOSIS — Z23 Encounter for immunization: Secondary | ICD-10-CM | POA: Diagnosis not present

## 2021-01-16 DIAGNOSIS — E1159 Type 2 diabetes mellitus with other circulatory complications: Secondary | ICD-10-CM | POA: Diagnosis not present

## 2021-01-16 DIAGNOSIS — I10 Essential (primary) hypertension: Secondary | ICD-10-CM | POA: Diagnosis not present

## 2021-01-16 DIAGNOSIS — E6609 Other obesity due to excess calories: Secondary | ICD-10-CM | POA: Diagnosis not present

## 2021-01-16 DIAGNOSIS — E782 Mixed hyperlipidemia: Secondary | ICD-10-CM

## 2021-01-16 NOTE — Progress Notes (Signed)
I,Tianna Badgett,acting as a Education administrator for [employer], FNP.,have documented all relevant documentation on the behalf of [name], FNP,as directed by  [name], FNP while in the presence of [name], [place].  This visit occurred during the SARS-CoV-2 public health emergency.  Safety protocols were in place, including screening questions prior to the visit, additional usage of staff PPE, and extensive cleaning of exam room while observing appropriate contact time as indicated for disinfecting solutions.  Subjective:     Patient ID: Donna Gilmore , female    DOB: 1940-12-14 , 80 y.o.   MRN: [id number]   Chief Complaint  Patient presents with   Hyperlipidemia    HPI  Patient is here today for DM and HTN follow up. Blood pressure is ranging 121-158/71-90.  She is using eucalyptus tea. She is having her house checked about the air quality. She is dealing with sinus congestion daily  Wt Readings from Last 3 Encounters: 01/16/21 : 176 lb 3.2 oz (79.9 kg) 11/13/20 : 171 lb 9.6 oz (77.8 kg) 10/12/20 : 175 lb (79.4 kg)    Hyperlipidemia She has no history of chronic renal disease.  Hypertension This is a chronic problem. The current episode started more than 1 year ago. The problem is unchanged. Pertinent negatives include no headaches. There are no associated agents to hypertension. Risk factors for coronary artery disease include sedentary lifestyle. Past treatments include diuretics and ACE inhibitors. There are no compliance problems.  Hypertensive end-organ damage includes angina. There is no history of chronic renal disease.  Diabetes She presents for her follow-up diabetic visit. She has type 2 diabetes mellitus. Pertinent negatives for hypoglycemia include no headaches. There are no diabetic associated symptoms. Pertinent negatives for diabetes include no fatigue. There are no hypoglycemic complications. There are no diabetic complications. Risk factors for coronary artery  disease include obesity and sedentary lifestyle. She is compliant with treatment all of the time. She is following a generally healthy diet. When asked about meal planning, she reported none. She has not had a previous visit with a dietitian. She rarely participates in exercise. (103-143 blood sugar range) An ACE inhibitor/angiotensin II receptor blocker is being taken. She does not see a podiatrist.Eye exam is not current.    Past Medical History:  Diagnosis Date   Benign paroxysmal positional vertigo 08/26/2012   CVA (cerebral infarction) 08/25/2012   Diabetes mellitus without complication ([place])    Dyslipidemia 08/25/2012   Essential hypertension, benign 08/25/2012   Hyperlipidemia    Hypertension    Meralgia paraesthetica, left 01/12/2018   Stroke [facility])      Family History  Problem Relation Age of Onset   Kidney failure Mother    Diabetes Mother    Alzheimer's disease Mother    Alzheimer's disease Father    Prostate cancer Father    Diabetes Maternal Grandmother    Diabetes Maternal Grandfather    Alzheimer's disease Paternal Grandmother    Alzheimer's disease Paternal Grandfather    Prostate cancer Paternal Uncle      Current Outpatient Medications:    acetaminophen (TYLENOL) 500 MG tablet, Take 1 tablet (500 mg total) by mouth every 6 (six) hours as needed., Disp: 30 tablet, Rfl: 0   albuterol (VENTOLIN HFA) 108 (90 Base) MCG/ACT inhaler, Inhale 2 puffs into the lungs every 6 (six) hours as needed for wheezing or shortness of breath., Disp: 8 g, Rfl: 6   atorvastatin (LIPITOR) 80 MG tablet, TAKE 1 TABLET BY MOUTH  DAILY, Disp: 90  tablet, Rfl: 3   Azelastine HCl 137 MCG/SPRAY SOLN, Place 2 sprays into the nose daily., Disp: 30 mL, Rfl: 2   Budeson-Glycopyrrol-Formoterol (BREZTRI AEROSPHERE) 160-9-4.8 MCG/ACT AERO, USE 2 INHALATIONS BY MOUTH  DAILY, Disp: 32.1 g, Rfl: 2   cholecalciferol (VITAMIN D3) 25 MCG (1000 UNIT) tablet, Take 1,000 Units by mouth daily., Disp: , Rfl:     clopidogrel (PLAVIX) 75 MG tablet, TAKE 1 TABLET BY MOUTH  DAILY, Disp: 90 tablet, Rfl: 3   diazepam (VALIUM) 5 MG tablet, Take 5 mg by mouth as needed for anxiety., Disp: , Rfl:    diclofenac Sodium (VOLTAREN) 1 % GEL, Apply topically 4 (four) times daily., Disp: , Rfl:    donepezil (ARICEPT) 5 MG tablet, TAKE 1 TABLET BY MOUTH  DAILY, Disp: 90 tablet, Rfl: 3   gabapentin (NEURONTIN) 100 MG capsule, TAKE 1 CAPSULE BY MOUTH 3  TIMES DAILY AS NEEDED, Disp: 270 capsule, Rfl: 3   Ginkgo Biloba 40 MG TABS, Take by mouth., Disp: , Rfl:    glucose blood (ONETOUCH ULTRA) test strip, USE WITH METER TO CHECK  BLOOD SUGAR BEFORE  BREAKFAST AND BEFORE DINNER dx: e11.65, Disp: 300 strip, Rfl: 3   glucose blood (ONETOUCH ULTRA) test strip, USE WITH METER TO CHECK  BLOOD SUGAR BEFORE  BREAKFAST AND BEFORE DINNER, Disp: 200 strip, Rfl: 3   levocetirizine (XYZAL) 5 MG tablet, Take 1 tablet (5 mg total) by mouth every evening., Disp: 90 tablet, Rfl: 1   magic mouthwash w/lidocaine SOLN, Take 5 mLs by mouth 3 (three) times daily. 1 part benadryl, 1 part nystatin and 1 part lidocaine, Disp: 120 mL, Rfl: 0   meclizine (ANTIVERT) 25 MG tablet, TAKE 1 TABLET BY MOUTH 2  TIMES EVERY DAY AS NEEDED, Disp: 90 tablet, Rfl: 1   olmesartan-hydrochlorothiazide (BENICAR HCT) 40-12.5 MG tablet, TAKE 1 TABLET BY MOUTH  DAILY, Disp: 90 tablet, Rfl: 3   OVER THE COUNTER MEDICATION, , Disp: , Rfl:    Probiotic Product (PROBIOTIC-10 PO), Take by mouth. Take one tablet daily, Disp: , Rfl:    RYBELSUS 7 MG TABS, TAKE 1 TABLET BY MOUTH  DAILY, Disp: 90 tablet, Rfl: 3   Spacer/Aero-Holding Chambers DEVI, 1 each by Does not apply route. Please dispense spacer. DX: J44.9, Disp: , Rfl:    vitamin B-12 (CYANOCOBALAMIN) 100 MCG tablet, Take 100 mcg by mouth daily., Disp: , Rfl:    vitamin C (ASCORBIC ACID) 500 MG tablet, Take 500 mg by mouth daily., Disp: , Rfl:    VITAMIN D PO, Take 1 tablet by mouth daily., Disp: , Rfl:   Current  Facility-Administered Medications:    0.9 %  sodium chloride infusion, 500 mL, Intravenous, Once, [name], MD   No Known Allergies   Review of Systems  Constitutional: Negative.  Negative for fatigue.  HENT:  Positive for congestion and postnasal drip.   Respiratory: Negative.    Cardiovascular: Negative.   Gastrointestinal: Negative.   Neurological: Negative.  Negative for headaches.  Psychiatric/Behavioral: Negative.      Today's Vitals   01/16/21 1147  BP: 126/62  Pulse: 71  Temp: 98.6 F (37 C)  TempSrc: Oral  Weight: 176 lb 3.2 oz (79.9 kg)  Height: 5' 2.8" (1.595 m)   Body mass index is 31.41 kg/m.  Wt Readings from Last 3 Encounters:  01/16/21 176 lb 3.2 oz (79.9 kg)  11/13/20 171 lb 9.6 oz (77.8 kg)  10/12/20 175 lb (79.4 kg)    Objective:  Physical Exam Vitals reviewed.  Constitutional:      General: She is not in acute distress.    Appearance: Normal appearance.  Cardiovascular:     Rate and Rhythm: Normal rate and regular rhythm.     Pulses: Normal pulses.     Heart sounds: Normal heart sounds. No murmur heard. Pulmonary:     Effort: Pulmonary effort is normal. No respiratory distress.     Breath sounds: Normal breath sounds. No wheezing.  Skin:    General: Skin is warm and dry.     Capillary Refill: Capillary refill takes less than 2 seconds.  Neurological:     General: No focal deficit present.     Mental Status: She is alert and oriented to person, place, and time.     Cranial Nerves: No cranial nerve deficit.     Motor: No weakness.  Psychiatric:        Mood and Affect: Mood normal.        Behavior: Behavior normal.        Thought Content: Thought content normal.        Judgment: Judgment normal.        Assessment And Plan:     1. Essential hypertension Comments: Blood pressure is well controlled, continue current medications - BMP8+EGFR  2. Type 2 diabetes mellitus with other circulatory complication, without long-term current  use of insulin (HCC) Comments: Her HgbA1c is slowly improving and she is tolerating Rybelsus well.  - Hemoglobin A1c  3. Mixed hyperlipidemia Comments: Cholesterol levels had improved at last visit. Continue statin.  - Lipid panel  4. Class 1 obesity due to excess calories with serious comorbidity and body mass index (BMI) of 31.0 to 31.9 in adult She is encouraged to strive for BMI less than 30 to decrease cardiac risk. Advised to aim for at least 150 minutes of exercise per week.  5. Immunization due Influenza vaccine administered Encouraged to take Tylenol as needed for fever or muscle aches. - Flu Vaccine QUAD High Dose(Fluad)     Patient was given opportunity to ask questions. Patient verbalized understanding of the plan and was able to repeat key elements of the plan. All questions were answered to their satisfaction.  [name], FNP   I, [name], FNP, have reviewed all documentation for this visit. The documentation on 01/16/21 for the exam, diagnosis, procedures, and orders are all accurate and complete.   IF YOU HAVE BEEN REFERRED TO A SPECIALIST, IT MAY TAKE 1-2 WEEKS TO SCHEDULE/PROCESS THE REFERRAL. IF YOU HAVE NOT HEARD FROM US/SPECIALIST IN TWO WEEKS, PLEASE GIVE [place] A CALL AT [phone] X [phone].   THE PATIENT IS ENCOURAGED TO PRACTICE SOCIAL DISTANCING DUE TO THE COVID-19 PANDEMIC.

## 2021-01-16 NOTE — Patient Instructions (Signed)

## 2021-01-17 LAB — BMP8+EGFR
BUN/Creatinine Ratio: 11 — ABNORMAL LOW (ref 12–28)
BUN: 13 mg/dL (ref 8–27)
CO2: 24 mmol/L (ref 20–29)
Calcium: 9.7 mg/dL (ref 8.7–10.3)
Chloride: 102 mmol/L (ref 96–106)
Creatinine, Ser: 1.16 mg/dL — ABNORMAL HIGH (ref 0.57–1.00)
Glucose: 96 mg/dL (ref 70–99)
Potassium: 4.3 mmol/L (ref 3.5–5.2)
Sodium: 141 mmol/L (ref 134–144)
eGFR: 48 mL/min/{1.73_m2} — ABNORMAL LOW (ref >59)

## 2021-01-17 LAB — LIPID PANEL
Chol/HDL Ratio: 4.6 ratio — ABNORMAL HIGH (ref 0.0–4.4)
Cholesterol, Total: 232 mg/dL — ABNORMAL HIGH (ref 100–199)
HDL: 50 mg/dL (ref >39)
LDL Chol Calc (NIH): 116 mg/dL — ABNORMAL HIGH (ref 0–99)
Triglycerides: 379 mg/dL — ABNORMAL HIGH (ref 0–149)
VLDL Cholesterol Cal: 66 mg/dL — ABNORMAL HIGH (ref 5–40)

## 2021-01-17 LAB — HEMOGLOBIN A1C
Est. average glucose Bld gHb Est-mCnc: 148 mg/dL
Hgb A1c MFr Bld: 6.8 % — ABNORMAL HIGH (ref 4.8–5.6)

## 2021-01-23 ENCOUNTER — Telehealth: Payer: Self-pay

## 2021-01-23 NOTE — Progress Notes (Signed)
Chronic Care Management Pharmacy Assistant   Name: Donna Gilmore  MRN: [id number] DOB: 05-31-1940   Reason for Encounter: Disease State/ Diabetes    Recent office visits:   01/16/2021 [name] FNP ( PCP)  01/03/2021 Daneen Schick BSW (CCM)   12/28/2020 Barb Merino [name]   Recent consult visits: None  Hospital visits:  None in previous 6 months  Medications: Outpatient Encounter Medications as of 01/23/2021  Medication Sig   acetaminophen (TYLENOL) 500 MG tablet Take 1 tablet (500 mg total) by mouth every 6 (six) hours as needed.   albuterol (VENTOLIN HFA) 108 (90 Base) MCG/ACT inhaler Inhale 2 puffs into the lungs every 6 (six) hours as needed for wheezing or shortness of breath.   atorvastatin (LIPITOR) 80 MG tablet TAKE 1 TABLET BY MOUTH  DAILY   Azelastine HCl 137 MCG/SPRAY SOLN Place 2 sprays into the nose daily.   Budeson-Glycopyrrol-Formoterol (BREZTRI AEROSPHERE) 160-9-4.8 MCG/ACT AERO USE 2 INHALATIONS BY MOUTH  DAILY   cholecalciferol (VITAMIN D3) 25 MCG (1000 UNIT) tablet Take 1,000 Units by mouth daily.   clopidogrel (PLAVIX) 75 MG tablet TAKE 1 TABLET BY MOUTH  DAILY   diazepam (VALIUM) 5 MG tablet Take 5 mg by mouth as needed for anxiety.   diclofenac Sodium (VOLTAREN) 1 % GEL Apply topically 4 (four) times daily.   donepezil (ARICEPT) 5 MG tablet TAKE 1 TABLET BY MOUTH  DAILY   gabapentin (NEURONTIN) 100 MG capsule TAKE 1 CAPSULE BY MOUTH 3  TIMES DAILY AS NEEDED   Ginkgo Biloba 40 MG TABS Take by mouth.   glucose blood (ONETOUCH ULTRA) test strip USE WITH METER TO CHECK  BLOOD SUGAR BEFORE  BREAKFAST AND BEFORE DINNER dx: e11.65   glucose blood (ONETOUCH ULTRA) test strip USE WITH METER TO CHECK  BLOOD SUGAR BEFORE  BREAKFAST AND BEFORE DINNER   levocetirizine (XYZAL) 5 MG tablet Take 1 tablet (5 mg total) by mouth every evening.   magic mouthwash w/lidocaine SOLN Take 5 mLs by mouth 3 (three) times daily. 1 part benadryl, 1 part nystatin and 1  part lidocaine   meclizine (ANTIVERT) 25 MG tablet TAKE 1 TABLET BY MOUTH 2  TIMES EVERY DAY AS NEEDED   olmesartan-hydrochlorothiazide (BENICAR HCT) 40-12.5 MG tablet TAKE 1 TABLET BY MOUTH  DAILY   OVER THE COUNTER MEDICATION    Probiotic Product (PROBIOTIC-10 PO) Take by mouth. Take one tablet daily   RYBELSUS 7 MG TABS TAKE 1 TABLET BY MOUTH  DAILY   Spacer/Aero-Holding Chambers DEVI 1 each by Does not apply route. Please dispense spacer. DX: J44.9   vitamin B-12 (CYANOCOBALAMIN) 100 MCG tablet Take 100 mcg by mouth daily.   vitamin C (ASCORBIC ACID) 500 MG tablet Take 500 mg by mouth daily.   VITAMIN D PO Take 1 tablet by mouth daily.   Facility-Administered Encounter Medications as of 01/23/2021  Medication   0.9 %  sodium chloride infusion    Recent Relevant Labs: Lab Results  Component Value Date/Time   HGBA1C 6.8 (H) 01/16/2021 12:47 PM   HGBA1C 6.9 (H) 09/25/2020 11:33 AM   MICROALBUR 30 07/26/2020 04:28 PM   MICROALBUR 10 06/30/2019 11:47 AM    Kidney Function Lab Results  Component Value Date/Time   CREATININE 1.16 (H) 01/16/2021 12:47 PM   CREATININE 1.58 (H) 09/25/2020 11:33 AM   GFRNONAA 48 (L) 10/19/2019 04:19 PM   GFRAA 56 (L) 10/19/2019 04:19 PM     Current antihyperglycemic regimen:   Rybelsus 7  mg 1 tablet daily   Patient verbally confirms she is taking the above medications as directed. Yes  What recent interventions/DTPs have been made to improve glycemic control:  Patient stated she watching her sweet and carb intake  Have there been any recent hospitalizations or ED visits since last visit with CPP? No  Patient denies hypoglycemic symptoms,   Patient denies hyperglycemic symptoms, including none  How often are you checking your blood sugar? 3-4 times daily  What are your blood sugars ranging?  Fasting: 129 Before meals: 129 After meals: 136 Bedtime: 112  On insulin? No   During the week, how often does your blood glucose drop below 70?  Never  Are you checking your feet daily/regularly? Yes  Adherence Review: Is the patient currently on a STATIN medication? Yes Is the patient currently on ACE/ARB medication? Yes Does the patient have >5 day gap between last estimated fill dates? No   Care Gaps: Last eye exam- 09/01/2019 Last Annual Wellness Visit-09/25/2020 Last Diabetic Foot Exam- 05/16/2020    Star Rating Drugs:  Medication:  Last Fill: Day Supply  Atorvastatin 80 mg        9/30, 12/23        90 DS Rybelsus 7 mg            Patient Assistance Program Olmesartan-Hydrochlorothiazide 40-12.5 mg       10/14, 12/23  90 DS   Cherlyn Labella Clinical Pharmacist Assistant [phone]

## 2021-01-24 ENCOUNTER — Ambulatory Visit: Payer: Medicare Other

## 2021-01-24 ENCOUNTER — Encounter: Payer: Self-pay | Admitting: Nurse Practitioner

## 2021-01-24 DIAGNOSIS — E1159 Type 2 diabetes mellitus with other circulatory complications: Secondary | ICD-10-CM

## 2021-01-24 DIAGNOSIS — N1831 Chronic kidney disease, stage 3a: Secondary | ICD-10-CM

## 2021-01-24 DIAGNOSIS — I10 Essential (primary) hypertension: Secondary | ICD-10-CM

## 2021-01-24 NOTE — Patient Instructions (Signed)
Social Worker Visit Information  Goals we discussed today:   Goals Addressed             This Visit's Progress    COMPLETED: Quality of Life Maintained       Timeframe:  Short-Term Goal Priority:  High Start Date:    12.7.22                                             Patient Goals/Self-Care Activities patient will:  -Access OTC benefit as needed  - Contact SW as needed          Materials Provided: No: Patient declined  Patient verbalizes understanding of instructions provided today and agrees to view in [place].   Follow Up Plan:  No follow up planned at this time. Please contact me as needed   Daneen Schick, BSW, CDP Social Worker, Certified Dementia Practitioner [place] [facility] [place] [facility] [phone]

## 2021-01-24 NOTE — Chronic Care Management (AMB) (Signed)
Chronic Care Management    Social Work Note  01/24/2021 Name: Donna Gilmore MRN: [id number] DOB: 09-08-1940  Donna Gilmore is a 80 y.o. year old female who is a primary care patient of [name], [place]. The CCM team was consulted to assist the patient with chronic disease management and/or care coordination needs related to:  HTN, DM II, COPD, CKD III .   Engaged with patient by telephone for follow up visit in response to provider referral for social work chronic care management and care coordination services.   Consent to Services:  The patient was given information about Chronic Care Management services, agreed to services, and gave verbal consent prior to initiation of services.  Please see initial visit note for detailed documentation.   Patient agreed to services and consent obtained.   Assessment: Review of patient past medical history, allergies, medications, and health status, including review of relevant consultants reports was performed today as part of a comprehensive evaluation and provision of chronic care management and care coordination services.     SDOH (Social Determinants of Health) assessments and interventions performed:    Advanced Directives Status: Not addressed in this encounter.  CCM Care Plan  No Known Allergies  Outpatient Encounter Medications as of 01/24/2021  Medication Sig   acetaminophen (TYLENOL) 500 MG tablet Take 1 tablet (500 mg total) by mouth every 6 (six) hours as needed.   albuterol (VENTOLIN HFA) 108 (90 Base) MCG/ACT inhaler Inhale 2 puffs into the lungs every 6 (six) hours as needed for wheezing or shortness of breath.   atorvastatin (LIPITOR) 80 MG tablet TAKE 1 TABLET BY MOUTH  DAILY   Azelastine HCl 137 MCG/SPRAY SOLN Place 2 sprays into the nose daily.   Budeson-Glycopyrrol-Formoterol (BREZTRI AEROSPHERE) 160-9-4.8 MCG/ACT AERO USE 2 INHALATIONS BY MOUTH  DAILY   cholecalciferol (VITAMIN D3) 25 MCG (1000 UNIT) tablet Take 1,000  Units by mouth daily.   clopidogrel (PLAVIX) 75 MG tablet TAKE 1 TABLET BY MOUTH  DAILY   diazepam (VALIUM) 5 MG tablet Take 5 mg by mouth as needed for anxiety.   diclofenac Sodium (VOLTAREN) 1 % GEL Apply topically 4 (four) times daily.   donepezil (ARICEPT) 5 MG tablet TAKE 1 TABLET BY MOUTH  DAILY   gabapentin (NEURONTIN) 100 MG capsule TAKE 1 CAPSULE BY MOUTH 3  TIMES DAILY AS NEEDED   Ginkgo Biloba 40 MG TABS Take by mouth.   glucose blood (ONETOUCH ULTRA) test strip USE WITH METER TO CHECK  BLOOD SUGAR BEFORE  BREAKFAST AND BEFORE DINNER dx: e11.65   glucose blood (ONETOUCH ULTRA) test strip USE WITH METER TO CHECK  BLOOD SUGAR BEFORE  BREAKFAST AND BEFORE DINNER   levocetirizine (XYZAL) 5 MG tablet Take 1 tablet (5 mg total) by mouth every evening.   magic mouthwash w/lidocaine SOLN Take 5 mLs by mouth 3 (three) times daily. 1 part benadryl, 1 part nystatin and 1 part lidocaine   meclizine (ANTIVERT) 25 MG tablet TAKE 1 TABLET BY MOUTH 2  TIMES EVERY DAY AS NEEDED   olmesartan-hydrochlorothiazide (BENICAR HCT) 40-12.5 MG tablet TAKE 1 TABLET BY MOUTH  DAILY   OVER THE COUNTER MEDICATION    Probiotic Product (PROBIOTIC-10 PO) Take by mouth. Take one tablet daily   RYBELSUS 7 MG TABS TAKE 1 TABLET BY MOUTH  DAILY   Spacer/Aero-Holding Chambers DEVI 1 each by Does not apply route. Please dispense spacer. DX: J44.9   vitamin B-12 (CYANOCOBALAMIN) 100 MCG tablet Take 100 mcg by mouth  daily.   vitamin C (ASCORBIC ACID) 500 MG tablet Take 500 mg by mouth daily.   VITAMIN D PO Take 1 tablet by mouth daily.   Facility-Administered Encounter Medications as of 01/24/2021  Medication   0.9 %  sodium chloride infusion    Patient Active Problem List   Diagnosis Date Noted   Chronic rhinitis 09/04/2020   COPD with asthma ([place]) 02/25/2019   Peripheral vascular disease ([place]) 02/25/2019   Chronic bilateral low back pain without sciatica 06/01/2018   Class 1 obesity due to excess calories  without serious comorbidity with body mass index (BMI) of 32.0 to 32.9 in adult 06/01/2018   Acute left-sided low back pain with left-sided sciatica 05/04/2018   Shortness of breath 05/04/2018   Meralgia paraesthetica, left 01/12/2018   Mild cognitive impairment 10/14/2016   History of stroke 10/14/2016   History of syphilis 10/14/2016   Mixed hyperlipidemia 10/14/2016   Insomnia 10/14/2016   Cerebellar stroke syndrome 10/23/2013   Smoker 02/16/2013   Chest pain 09/21/2012   Tobacco abuse disorder 09/21/2012   Benign paroxysmal positional vertigo 08/26/2012   CVA (cerebral infarction) 08/25/2012   Essential hypertension 08/25/2012   Dyslipidemia 08/25/2012    Conditions to be addressed/monitored: HTN, COPD, DMII, and CKD Stage III  Care Plan : Social Work Plan of Care  Updates made by Daneen Schick since 01/24/2021 12:00 AM  Completed 01/24/2021   Problem: Quality of Life (General Plan of Care) Resolved 01/24/2021     Goal: Quality of Life Maintained Completed 01/24/2021  Start Date: 01/03/2021  Recent Progress: On track  Priority: High  Note:   Current Barriers:  Chronic disease management support and education needs related to  HTN, DM II, COPD, CKD III   Limited understanding of health plan benefit Limited access to a caregiver  Social Worker Clinical Goal(s):  patient will work with SW to identify and address any acute and/or chronic care coordination needs related to the self health management of  HTN, DMII, COPD, CKD III   patient will work with SW to address concerns related to care coordination needs  SW Interventions:  Inter-disciplinary care team collaboration (see longitudinal plan of care) Collaboration with [name], [place] regarding development and update of comprehensive plan of care as evidenced by provider attestation and co-signature Telephonic visit completed with this patient Confirmed receipt of OTC items previously ordered with this writers  assistance Discussed the patient also received her new catalog for the 2023 plan year - patient is aware of how to access this benefit Assessed for care coordination needs - none identified at this time  Patient Goals/Self-Care Activities patient will:  -Access OTC benefit as needed  -  Contact SW as needed       Follow Up Plan:  No SW follow up planned at this time. The patient will remain engaged with RN Care Manager to address care management needs. SW is available as needed for future care coordination needs.      Daneen Schick, BSW, CDP Social Worker, Certified Dementia Practitioner [place] [facility] [phone]

## 2021-01-27 ENCOUNTER — Encounter: Payer: Self-pay | Admitting: Nurse Practitioner

## 2021-01-27 DIAGNOSIS — E1159 Type 2 diabetes mellitus with other circulatory complications: Secondary | ICD-10-CM

## 2021-01-27 DIAGNOSIS — I10 Essential (primary) hypertension: Secondary | ICD-10-CM

## 2021-01-27 DIAGNOSIS — J449 Chronic obstructive pulmonary disease, unspecified: Secondary | ICD-10-CM

## 2021-01-27 DIAGNOSIS — E782 Mixed hyperlipidemia: Secondary | ICD-10-CM | POA: Diagnosis not present

## 2021-01-27 DIAGNOSIS — N1831 Chronic kidney disease, stage 3a: Secondary | ICD-10-CM

## 2021-02-07 ENCOUNTER — Encounter: Payer: Self-pay | Admitting: Nurse Practitioner

## 2021-02-14 ENCOUNTER — Telehealth: Payer: Self-pay | Admitting: Nurse Practitioner

## 2021-02-14 NOTE — Telephone Encounter (Signed)
Left message for patient to call back and schedule Medicare Annual Wellness Visit (AWV) either virtually or in office.  Left both my jabber number [phone] and office number    Last AWV 07/26/20  please schedule at anytime with [place] for [facility] can be schedule calendar year   This should be a 45 minute visit.

## 2021-02-20 DIAGNOSIS — H6120 Impacted cerumen, unspecified ear: Secondary | ICD-10-CM | POA: Diagnosis not present

## 2021-02-22 ENCOUNTER — Other Ambulatory Visit: Payer: Self-pay | Admitting: Nurse Practitioner

## 2021-02-28 ENCOUNTER — Ambulatory Visit (INDEPENDENT_AMBULATORY_CARE_PROVIDER_SITE_OTHER): Payer: Medicare Other

## 2021-02-28 ENCOUNTER — Telehealth: Payer: Medicare Other

## 2021-02-28 ENCOUNTER — Other Ambulatory Visit: Payer: Self-pay | Admitting: Nurse Practitioner

## 2021-02-28 DIAGNOSIS — T7840XS Allergy, unspecified, sequela: Secondary | ICD-10-CM

## 2021-02-28 DIAGNOSIS — J449 Chronic obstructive pulmonary disease, unspecified: Secondary | ICD-10-CM

## 2021-02-28 DIAGNOSIS — E1159 Type 2 diabetes mellitus with other circulatory complications: Secondary | ICD-10-CM

## 2021-02-28 DIAGNOSIS — R0981 Nasal congestion: Secondary | ICD-10-CM

## 2021-02-28 DIAGNOSIS — E782 Mixed hyperlipidemia: Secondary | ICD-10-CM

## 2021-02-28 DIAGNOSIS — E785 Hyperlipidemia, unspecified: Secondary | ICD-10-CM

## 2021-02-28 DIAGNOSIS — I10 Essential (primary) hypertension: Secondary | ICD-10-CM

## 2021-02-28 DIAGNOSIS — N1831 Chronic kidney disease, stage 3a: Secondary | ICD-10-CM

## 2021-03-01 NOTE — Patient Instructions (Signed)
Visit Information  Thank you for taking time to visit with me today. Please don't hesitate to contact me if I can be of assistance to you before our next scheduled telephone appointment.  Following are the goals we discussed today:  (Copy and paste patient goals from clinical care plan here)  Our next appointment is by telephone on 05/24/21 at 12 noon   Please call the care guide team at [phone] if you need to cancel or reschedule your appointment.   If you are experiencing a Mental Health or [place] or need someone to talk to, please call [phone]-TALK (toll free, 24 hour hotline)   Patient verbalizes understanding of instructions and care plan provided today and agrees to view in [place]. Active MyChart status confirmed with patient.    Barb Merino, RN, BSN, CCM Care Management Coordinator [place] [facility]  Direct Phone: [phone]

## 2021-03-01 NOTE — Chronic Care Management (AMB) (Signed)
Chronic Care Management   CCM RN Visit Note  02/28/2021 Name: Donna Gilmore MRN: [id number] DOB: [birth year]  Subjective: Donna Gilmore is a 81 y.o. year old female who is a primary care patient of [name], [place]. The care management team was consulted for assistance with disease management and care coordination needs.    Engaged with patient by telephone for follow up visit in response to provider referral for case management and/or care coordination services.   Consent to Services:  The patient was given information about Chronic Care Management services, agreed to services, and gave verbal consent prior to initiation of services.  Please see initial visit note for detailed documentation.   Patient agreed to services and verbal consent obtained.   Assessment: Review of patient past medical history, allergies, medications, health status, including review of consultants reports, laboratory and other test data, was performed as part of comprehensive evaluation and provision of chronic care management services.   SDOH (Social Determinants of Health) assessments and interventions performed:  Yes, no acute changes   CCM Care Plan  No Known Allergies  Outpatient Encounter Medications as of 02/28/2021  Medication Sig   acetaminophen (TYLENOL) 500 MG tablet Take 1 tablet (500 mg total) by mouth every 6 (six) hours as needed.   albuterol (VENTOLIN HFA) 108 (90 Base) MCG/ACT inhaler Inhale 2 puffs into the lungs every 6 (six) hours as needed for wheezing or shortness of breath.   atorvastatin (LIPITOR) 80 MG tablet TAKE 1 TABLET BY MOUTH  DAILY   Azelastine HCl 137 MCG/SPRAY SOLN Place 2 sprays into the nose daily.   Budeson-Glycopyrrol-Formoterol (BREZTRI AEROSPHERE) 160-9-4.8 MCG/ACT AERO USE 2 INHALATIONS BY MOUTH  DAILY   cholecalciferol (VITAMIN D3) 25 MCG (1000 UNIT) tablet Take 1,000 Units by mouth daily.   clopidogrel (PLAVIX) 75 MG tablet TAKE 1 TABLET BY MOUTH  DAILY    diazepam (VALIUM) 5 MG tablet Take 5 mg by mouth as needed for anxiety.   diclofenac Sodium (VOLTAREN) 1 % GEL Apply topically 4 (four) times daily.   donepezil (ARICEPT) 5 MG tablet TAKE 1 TABLET BY MOUTH  DAILY   gabapentin (NEURONTIN) 100 MG capsule TAKE 1 CAPSULE BY MOUTH 3  TIMES DAILY AS NEEDED   Ginkgo Biloba 40 MG TABS Take by mouth.   glucose blood (ONETOUCH ULTRA) test strip USE WITH METER TO CHECK  BLOOD SUGAR BEFORE  BREAKFAST AND BEFORE DINNER dx: e11.65   glucose blood (ONETOUCH ULTRA) test strip USE WITH METER TO CHECK  BLOOD SUGAR BEFORE  BREAKFAST AND BEFORE DINNER   levocetirizine (XYZAL) 5 MG tablet Take 1 tablet (5 mg total) by mouth every evening.   magic mouthwash w/lidocaine SOLN Take 5 mLs by mouth 3 (three) times daily. 1 part benadryl, 1 part nystatin and 1 part lidocaine   meclizine (ANTIVERT) 25 MG tablet TAKE 1 TABLET BY MOUTH 2  TIMES EVERY DAY AS NEEDED   olmesartan-hydrochlorothiazide (BENICAR HCT) 40-12.5 MG tablet TAKE 1 TABLET BY MOUTH  DAILY   OVER THE COUNTER MEDICATION    Probiotic Product (PROBIOTIC-10 PO) Take by mouth. Take one tablet daily   RYBELSUS 7 MG TABS TAKE 1 TABLET BY MOUTH  DAILY   Spacer/Aero-Holding Chambers DEVI 1 each by Does not apply route. Please dispense spacer. DX: J44.9   vitamin B-12 (CYANOCOBALAMIN) 100 MCG tablet Take 100 mcg by mouth daily.   vitamin C (ASCORBIC ACID) 500 MG tablet Take 500 mg by mouth daily.   VITAMIN D PO  Take 1 tablet by mouth daily.   Facility-Administered Encounter Medications as of 02/28/2021  Medication   0.9 %  sodium chloride infusion    Patient Active Problem List   Diagnosis Date Noted   Chronic rhinitis 09/04/2020   COPD with asthma ([place]) 02/25/2019   Peripheral vascular disease ([place]) 02/25/2019   Chronic bilateral low back pain without sciatica 06/01/2018   Class 1 obesity due to excess calories without serious comorbidity with body mass index (BMI) of 32.0 to 32.9 in adult 06/01/2018   Acute  left-sided low back pain with left-sided sciatica 05/04/2018   Shortness of breath 05/04/2018   Meralgia paraesthetica, left 01/12/2018   Mild cognitive impairment 10/14/2016   History of stroke 10/14/2016   History of syphilis 10/14/2016   Mixed hyperlipidemia 10/14/2016   Insomnia 10/14/2016   Cerebellar stroke syndrome 10/23/2013   Smoker 02/16/2013   Chest pain 09/21/2012   Tobacco abuse disorder 09/21/2012   Benign paroxysmal positional vertigo 08/26/2012   CVA (cerebral infarction) 08/25/2012   Essential hypertension 08/25/2012   Dyslipidemia 08/25/2012    Conditions to be addressed/monitored: HTN, DM II, COPD, CKD III  Care Plan : RN Care Manager Plan of Care  Updates made by Lynne Logan, RN since 02/28/2021 12:00 AM     Problem: No Plan of Care established for management of chronic disease states (Essential hypertension, Type 2 diabetes mellitus, history of COPD, Stage 3 a Chronic Kidney disease, Mixed Hyperlipidemia)   Priority: High     Long-Range Goal: Development of plan of care for chronic disease management (Essential hypertension, Type 2 diabetes mellitus, history of COPD, Stage 3 a Chronic Kidney disease, Mixed Hyperlipidemia)   Recent Progress: On track  Priority: High  Note:   Current Barriers:  Knowledge Deficits related to plan of care for management of Essential hypertension, Type 2 diabetes mellitus, history of COPD, Stage 3 a Chronic Kidney disease, Mixed Hyperlipidemia    Chronic Disease Management support and education needs related to Essential hypertension, Type 2 diabetes mellitus, history of COPD, Stage 3 a Chronic Kidney disease, Mixed Hyperlipidemia    HOH  RNCM Clinical Goal(s):  Patient will verbalize basic understanding of  Essential hypertension, Type 2 diabetes mellitus, history of COPD, Stage 3 a Chronic Kidney disease, Mixed Hyperlipidemia   disease process and self health management plan as evidenced by patient will report having no  disease exacerbations related to chronic disease states listed above take all medications exactly as prescribed and will call provider for medication related questions as evidenced by patient will report having no missed doses of her prescribed medications demonstrate Improved health management independence as evidenced by patient will maintain the ability to perform self care continue to work with RN Care Manager to address care management and care coordination needs related to  Essential hypertension, Type 2 diabetes mellitus, history of COPD, Stage 3 a Chronic Kidney disease, Mixed Hyperlipidemia   as evidenced by adherence to CM Team Scheduled appointments demonstrate ongoing self health care management ability   as evidenced by    through collaboration with RN Care manager, provider, and care team.   Interventions: 1:1 collaboration with primary care provider regarding development and update of comprehensive plan of care as evidenced by provider attestation and co-signature Inter-disciplinary care team collaboration (see longitudinal plan of care) Evaluation of current treatment plan related to  self management and patient's adherence to plan as established by provider   Chronic Kidney Disease Interventions:  (Status:  Goal on track:  Yes.) Long Term Goal Assessed the Patient understanding of chronic kidney disease    Evaluation of current treatment plan related to chronic kidney disease self management and patient's adherence to plan as established by provider      Reviewed prescribed diet increase water to 48-64 oz daily unless otherwise directed  Provided education on kidney disease progression    Review of patient status, including review of consultant's reports, relevant laboratory and other test results, and medications completed. Reviewed medications with patient and discussed importance of medication adherence Discussed plans with patient for ongoing care management follow up and  provided patient with direct contact information for care management team Last practice recorded BP readings:  BP Readings from Last 3 Encounters:  01/16/21 126/62  11/13/20 130/72  09/25/20 130/72  Most recent eGFR/CrCl:  Lab Results  Component Value Date   EGFR 48 (L) 01/16/2021    No components found for: CRCL   Diabetes Interventions:  (Status:  Goal on track:  Yes.) Long Term Goal Assessed patient's understanding of A1c goal: <6.5% Review of patient status, including review of consultant's reports, relevant laboratory and other test results, and medications completed. Reviewed medications with patient and discussed importance of medication adherence Educated patient on dietary and exercise recommendations; daily glycemic control FBS 80-130, <180 after meals;15'15' rule Advised patient, providing education and rationale, to check cbg daily before meals and at bedtime and record, calling the CCM team and or PCP for findings outside established parameters Discussed plans with patient for ongoing care management follow up and provided patient with direct contact information for care management team Lab Results  Component Value Date   HGBA1C 6.8 (H) 01/16/2021    COPD Interventions:  (Status:  Condition stable.  Not addressed this visit.)  Long Term Goal Evaluation of current treatment plan related to COPD, self-management and patient's adherence to plan as established by provider Review of patient status, including review of consultant's reports, relevant laboratory and other test results, and medications completed. Reviewed medications with patient and discussed importance of medication adherence Provided education to patient re: avoiding breathing in cold air when possible by covering nose and mouth when entering cold air/wind  Discussed plans with patient for ongoing care management follow up and provided patient with direct contact information for care management  team  Hyperlipidemia Interventions:  (Status:  Goal on track:  Yes.) Long Term Goal Evaluation of current treatment plan related to Hyperlipidemia, self-management and patient's adherence to plan as established by provider Reviewed medications with patient and discussed importance of medication adherence, educated on additional pharmacological treatment options Educated patient regarding lipid clinic, patient is agreeable to referral Collaborated with PCP [name] FNP regarding referral for lipid clinic, referral approved and patient made aware, educated patient on referral process  Provider established cholesterol goals reviewed Counseled on importance of regular laboratory monitoring as prescribed Provided printed educational materials related to Cholesterol management  Reviewed importance of limiting foods high in cholesterol, educated on exercise recommendations (150 minutes weekly) Discussed plans with patient for ongoing care management follow up and provided patient with direct contact information for care management team Lipid Panel     Component Value Date/Time   CHOL 232 (H) 01/16/2021 1247   TRIG 379 (H) 01/16/2021 1247   HDL 50 01/16/2021 1247   CHOLHDL 4.6 (H) 01/16/2021 1247   CHOLHDL 8.7 08/26/2012 0538   VLDL 60 (H) 08/26/2012 0538   LDLCALC 116 (H) 01/16/2021 1247   LABVLDL 66 (H) 01/16/2021 1247  Patient Goals/Self-Care Activities: Take all medications as prescribed Attend all scheduled provider appointments Call pharmacy for medication refills 3-7 days in advance of running out of medications Attend church or other social activities Perform all self care activities independently  Perform IADL's (shopping, preparing meals, housekeeping, managing finances) independently Call provider office for new concerns or questions  drink 6 to 8 glasses of water each day manage portion size limit outdoor activity during cold weather take medications for blood pressure  exactly as prescribed adhere to prescribed diet: low trans/low saturated fat  Follow Up Plan:  Telephone follow up appointment with care management team member scheduled for:  05/24/21      Plan:Telephone follow up appointment with care management team member scheduled for:  05/24/21  Barb Merino, RN, BSN, CCM Care Management Coordinator [place] [facility]  Direct Phone: [phone]

## 2021-03-05 ENCOUNTER — Telehealth: Payer: Self-pay | Admitting: Nurse Practitioner

## 2021-03-05 NOTE — Telephone Encounter (Signed)
Left message for patient to call back and schedule Medicare Annual Wellness Visit (AWV) either virtually or in office.  Left both my jabber number [phone] and office number    Last AWV 2/29/22 ; please schedule at anytime with [facility]  Can schedule AWV calendar year [facility]   This should be a 45 minute visit.

## 2021-03-07 ENCOUNTER — Telehealth: Payer: Self-pay

## 2021-03-07 NOTE — Chronic Care Management (AMB) (Signed)
°  Called Donna Gilmore, No answer, left message of appointment on 03-07-2021 at 12:00 via telephone visit with Donna Gilmore, Pharm D. Notified to have all medications, supplements, blood pressure and/or blood sugar logs available during appointment and to return call if need to reschedule.  Care Gaps: Shingrix overdue   Covid booster overdue  Star Rating Drug: Atorvastatin 80 mg- Last filled 01-19-2021 90 DS [facility] 7 mg- Patient assistance Olmesartan-HCTZ 40-12.5 mg- Last filled 01-19-2021 90 DS [facility]  Any gaps in medications fill history? No  [place] Pharmacist Assistant [phone]

## 2021-03-14 ENCOUNTER — Ambulatory Visit: Payer: Medicare Other

## 2021-03-14 DIAGNOSIS — I10 Essential (primary) hypertension: Secondary | ICD-10-CM

## 2021-03-14 DIAGNOSIS — E785 Hyperlipidemia, unspecified: Secondary | ICD-10-CM

## 2021-03-14 NOTE — Progress Notes (Signed)
Chronic Care Management Pharmacy Note  03/27/2021 Name:  Donna Gilmore MRN:  [id number] DOB:  1940/08/05  Summary: Patient reports that she wants to make sure she is taking her medication properly   Recommendations/Changes made from today's visit: Recommend that Zetia 10 mg tablet be started by patient.   Plan: Collaborate with PCP team to start patient on Zetia 10 mg tablet.    Subjective: Donna Gilmore is an 81 y.o. year old female who is a primary patient of [name], [place].  The CCM team was consulted for assistance with disease management and care coordination needs.  Patient reports that her daughter Donna Gilmore helps her with healthcare. 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 Patient reports she got a letter from social services that she is number 55 on the list to provide assistance for her. She is uncertain of what those service are now, but she recalls the services that her mother received. Her used to come home around 6 pm but she gets worried when he does not come in until 9pm and 10 pm.  Engaged with patient by telephone for follow up visit in response to provider referral for pharmacy case management and/or care coordination services.   Consent to Services:  The patient was given information about Chronic Care Management services, agreed to services, and gave verbal consent prior to initiation of services.  Please see initial visit note for detailed documentation.   Patient Care Team: [name], FNP as PCP - General (General Practice) [name], FNP (General Practice) Rex Kras [name], RN as [place] Management  Recent office visits: 01/16/2021 PCP OV  11/13/2020 PCP OV  Recent consult visits: 10/19/2020 Opthalmology Visit  Hospital visits: None in previous 6 months   Objective:  Lab Results  Component Value Date   CREATININE 1.16 (H) 01/16/2021   BUN 13 01/16/2021   EGFR  48 (L) 01/16/2021   GFRNONAA 48 (L) 10/19/2019   GFRAA 56 (L) 10/19/2019   NA 141 01/16/2021   K 4.3 01/16/2021   CALCIUM 9.7 01/16/2021   CO2 24 01/16/2021   GLUCOSE 96 01/16/2021    Lab Results  Component Value Date/Time   HGBA1C 6.8 (H) 01/16/2021 12:47 PM   HGBA1C 6.9 (H) 09/25/2020 11:33 AM   MICROALBUR 30 07/26/2020 04:28 PM   MICROALBUR 10 06/30/2019 11:47 AM    Last diabetic Eye exam:  Lab Results  Component Value Date/Time   HMDIABEYEEXA No Retinopathy 10/19/2020 12:00 AM    Last diabetic Foot exam: No results found for: HMDIABFOOTEX   Lab Results  Component Value Date   CHOL 232 (H) 01/16/2021   HDL 50 01/16/2021   LDLCALC 116 (H) 01/16/2021   TRIG 379 (H) 01/16/2021   CHOLHDL 4.6 (H) 01/16/2021    Hepatic Function Latest Ref Rng & Units 09/25/2020 04/18/2020 10/19/2019  Total Protein 6.0 - 8.5 g/dL 7.2 7.4 7.3  Albumin 3.7 - 4.7 g/dL 4.3 4.4 4.6  AST 0 - 40 IU/L _0 ALT 0 - 32 IU/L _1 Alk Phosphatase 44 - 121 IU/L 114 105 131(H)  Total Bilirubin 0.0 - 1.2 mg/dL 0.2 0.2 0.3    Lab Results  Component Value Date/Time   TSH 3.600 04/18/2020 04:38 PM   TSH 2.800 10/26/2018 10:50 AM   FREET4 0.95 08/13/2013 08:15 AM    CBC Latest Ref Rng & Units 09/25/2020 04/18/2020 07/21/2019  WBC 3.4 - 10.8 x10E3/uL 7.4 8.0 6.1  Hemoglobin 11.1 - 15.9 g/dL 11.1 11.2 11.2  Hematocrit 34.0 - 46.6 % 34.1 33.6(L) 34.1  Platelets 150 - 450 x10E3/uL 261 258 240    No results found for: VD25OH  Clinical ASCVD: Yes  The ASCVD Risk score (Arnett DK, et al., 2019) failed to calculate for the following reasons:   The 2019 ASCVD risk score is only valid for ages 16 to 83   The patient has a prior MI or stroke diagnosis    Depression screen [facility] 2/9 07/26/2020 04/21/2020 06/30/2019  Decreased Interest 0 0 0  Down, Depressed, Hopeless 0 0 0  PHQ - 2 Score 0 0 0  Altered sleeping - - -  Tired, decreased energy - - -  Change in appetite - - -  Feeling bad or failure  about yourself  - - -  Trouble concentrating - - -  Moving slowly or fidgety/restless - - -  Suicidal thoughts - - -  PHQ-9 Score - - -       Social History   Tobacco Use  Smoking Status Former   Packs/day: 0.30   Years: 30.00   Pack years: 9.00   Types: Cigarettes   Quit date: 2019   Years since quitting: 4.1  Smokeless Tobacco Never   BP Readings from Last 3 Encounters:  01/16/21 126/62  11/13/20 130/72  09/25/20 130/72   Pulse Readings from Last 3 Encounters:  01/16/21 71  11/13/20 73  09/25/20 70   Wt Readings from Last 3 Encounters:  01/16/21 176 lb 3.2 oz (79.9 kg)  11/13/20 171 lb 9.6 oz (77.8 kg)  10/12/20 175 lb (79.4 kg)   BMI Readings from Last 3 Encounters:  01/16/21 31.41 kg/m  11/13/20 27.87 kg/m  10/12/20 28.42 kg/m    Assessment/Interventions: Review of patient past medical history, allergies, medications, health status, including review of consultants reports, laboratory and other test data, was performed as part of comprehensive evaluation and provision of chronic care management services.   SDOH:  (Social Determinants of Health) assessments and interventions performed: No  SDOH Screenings   Alcohol Screen: Not on file  Depression (PHQ2-9): Low Risk    PHQ-2 Score: 0  Financial Resource Strain: Low Risk    Difficulty of Paying Living Expenses: Not hard at all  Food Insecurity: No Food Insecurity   Worried About Charity fundraiser in the Last Year: Never true   Ran Out of Food in the Last Year: Never true  Housing: Low Risk    Last Housing Risk Score: 0  Physical Activity: Inactive   Days of Exercise per Week: 0 days   Minutes of Exercise per Session: 0 min  Social Connections: Not on file  Stress: No Stress Concern Present   Feeling of Stress : Not at all  Tobacco Use: Medium Risk   Smoking Tobacco Use: Former   Smokeless Tobacco Use: Never   Passive Exposure: Not on file  Transportation Needs: No Transportation Needs   Lack  of Transportation (Medical): No   Lack of Transportation (Non-Medical): No    CCM Care Plan  No Known Allergies  Medications Reviewed Today     Reviewed by Mayford Knife, RPH (Pharmacist) on 03/14/21 at [place] List Status: <None>   Medication Order Taking? Sig Documenting Provider Last Dose Status Informant  0.9 %  sodium chloride infusion [id number]   [name], MD  Active   acetaminophen (TYLENOL) 500  MG tablet [id number] No Take 1 tablet (500 mg total) by mouth every 6 (six) hours as needed. Chase Picket, MD Taking Active   albuterol (VENTOLIN HFA) 108 (90 Base) MCG/ACT inhaler [id number] No Inhale 2 puffs into the lungs every 6 (six) hours as needed for wheezing or shortness of breath. Rigoberto Noel, MD Taking Active   atorvastatin (LIPITOR) 80 MG tablet [id number]  TAKE 1 TABLET BY MOUTH  DAILY [name], FNP  Active   Azelastine HCl 137 MCG/SPRAY SOLN [id number] No Place 2 sprays into the nose daily. [name], FNP Taking Active   Budeson-Glycopyrrol-Formoterol [facility] AEROSPHERE) 160-9-4.8 MCG/ACT Hollie Salk [id number] No USE 2 INHALATIONS BY MOUTH  DAILY [name], FNP Taking Active   cholecalciferol (VITAMIN D3) 25 MCG (1000 UNIT) tablet [id number] No Take 1,000 Units by mouth daily. [provider] Taking Active   clopidogrel (PLAVIX) 75 MG tablet [id number]  TAKE 1 TABLET BY MOUTH  DAILY [name], FNP  Active   diazepam (VALIUM) 5 MG tablet [id number] No Take 5 mg by mouth as needed for anxiety. [provider] Taking Active   diclofenac Sodium (VOLTAREN) 1 % GEL [id number] No Apply topically 4 (four) times daily. [provider] Taking Active   donepezil (ARICEPT) 5 MG tablet [id number] No TAKE 1 TABLET BY MOUTH  DAILY [name], FNP Taking Active   gabapentin (NEURONTIN) 100 MG capsule [id number] No TAKE 1 CAPSULE BY MOUTH 3  TIMES DAILY AS NEEDED [name], FNP Taking Active   Ginkgo Biloba 40 MG TABS [id number] No Take by  mouth. [provider] Taking Active Self  glucose blood (ONETOUCH ULTRA) test strip [id number] No USE WITH METER TO CHECK  BLOOD SUGAR BEFORE  BREAKFAST AND BEFORE DINNER dx: e11.65 [name], FNP Taking Active   glucose blood (ONETOUCH ULTRA) test strip [id number] No USE WITH METER TO CHECK  BLOOD SUGAR BEFORE  BREAKFAST AND BEFORE Andrey Spearman, Doreene Burke, FNP Taking Active   levocetirizine (XYZAL) 5 MG tablet [id number] No Take 1 tablet (5 mg total) by mouth every evening. [name], FNP Taking Active   magic mouthwash w/lidocaine SOLN [id number] No Take 5 mLs by mouth 3 (three) times daily. 1 part benadryl, 1 part nystatin and 1 part lidocaine [name], FNP Taking Active   meclizine (ANTIVERT) 25 MG tablet [id number] No TAKE 1 TABLET BY MOUTH 2  TIMES EVERY DAY AS NEEDED [name], FNP Taking Active   olmesartan-hydrochlorothiazide [facility] HCT) 40-12.5 MG tablet [id number]  TAKE 1 TABLET BY MOUTH  DAILY [name], FNP  Active   OVER THE COUNTER MEDICATION [id number] No  [provider] Taking Active   Probiotic Product (PROBIOTIC-10 PO) [id number] No Take by mouth. Take one tablet daily [provider] Taking Active   RYBELSUS 7 MG TABS [id number] No TAKE 1 TABLET BY MOUTH  DAILY [name], FNP Taking Active   Spacer/Aero-Holding [facility] [id number] No 1 each by Does not apply route. Please dispense spacer. DX: J44.9 [provider] Taking Active   vitamin B-12 (CYANOCOBALAMIN) 100 MCG tablet [id number] No Take 100 mcg by mouth daily. [provider] Taking Active   vitamin C (ASCORBIC ACID) 500 MG tablet [id number] No Take 500 mg by mouth daily. [provider] Taking Active Self  VITAMIN D PO [id number] No Take 1 tablet by mouth daily. [provider] Taking Active             Patient Active Problem List   Diagnosis Date Noted  Chronic rhinitis 09/04/2020   COPD with asthma ([place]) 02/25/2019   Peripheral  vascular disease ([place]) 02/25/2019   Chronic bilateral low back pain without sciatica 06/01/2018   Class 1 obesity due to excess calories without serious comorbidity with body mass index (BMI) of 32.0 to 32.9 in adult 06/01/2018   Acute left-sided low back pain with left-sided sciatica 05/04/2018   Shortness of breath 05/04/2018   Meralgia paraesthetica, left 01/12/2018   Mild cognitive impairment 10/14/2016   History of stroke 10/14/2016   History of syphilis 10/14/2016   Mixed hyperlipidemia 10/14/2016   Insomnia 10/14/2016   Cerebellar stroke syndrome 10/23/2013   Smoker 02/16/2013   Chest pain 09/21/2012   Tobacco abuse disorder 09/21/2012   Benign paroxysmal positional vertigo 08/26/2012   CVA (cerebral infarction) 08/25/2012   Essential hypertension 08/25/2012   Dyslipidemia 08/25/2012    Immunization History  Administered Date(s) Administered   Fluad Quad(high Dose 65+) 10/19/2019, 01/16/2021   Influenza, High Dose Seasonal PF 11/05/2017   Influenza-Unspecified 10/06/2018   Moderna Sars-Covid-2 Vaccination 02/23/2019, 03/23/2019, 11/23/2019   Pneumococcal Conjugate-13 07/07/2018   Pneumococcal Polysaccharide-23 05/16/2020    Conditions to be addressed/monitored:  Hypertension and Hyperlipidemia  Care Plan : [place]  Updates made by Mayford Knife, [place] since 03/27/2021 12:00 AM     Problem: HTN, HLD   Priority: High     Goal: Disease Management   Recent Progress: On track  Priority: High  Note:   Current Barriers:  Unable to independently monitor therapeutic efficacy  Pharmacist Clinical Goal(s):  Patient will achieve adherence to monitoring guidelines and medication adherence to achieve therapeutic efficacy through collaboration with PharmD and provider.   Interventions: 1:1 collaboration with [name], FNP regarding development and update of comprehensive plan of care as evidenced by provider attestation and  co-signature Inter-disciplinary care team collaboration (see longitudinal plan of care) Comprehensive medication review performed; medication list updated in electronic medical record  Hypertension (BP goal <140/90) -Controlled -Current treatment: Olmesartan-hydrochlorothiazide 40-12.5 mg tablet once per day Appropriate, Effective, Safe, Accessible -Current home readings: she is checking her BP at home twice per day, her current readings: 128/83 Pulse: 69, BP: 129/65, 12th at 9:30 128/75 pulse: 84  -Current dietary habits: She does not use salt, and she uses a lot of garlic.  -Current exercise habits: she is not currently exercising as much due to [place].  -Denies hypotensive/hypertensive symptoms -Educated on Daily salt intake goal < 2300 mg; Proper BP monitoring technique; Symptoms of hypotension and importance of maintaining adequate hydration; -Counseled to monitor BP at home maybe once per week, document, and provide log at future appointments -Recommended to continue current medication  Hyperlipidemia: (LDL goal < 70) -Uncontrolled -Current treatment: Atorvastatin 80 mg tablet once per day Appropriate, Effective, Safe, Accessible -Current dietary patterns: she is eating plenty of vegetables, she does not eat a lot of meat. She eats a lot of chicken and fish -Current exercise habits: please see hypertension  -Educated on Cholesterol goals;  Importance of limiting foods high in cholesterol; -Recommended to continue current medication Collaborated with PCP team to add Zetia to patients current medicat  Patient Goals/Self-Care Activities Patient will:  - take medications as prescribed as evidenced by patient report and record review  Follow Up Plan: The patient has been provided with contact information for the care management team and has been advised to call with any health related questions or concerns.       Medication Assistance: None required.  Patient  affirms current  coverage meets needs.  Compliance/Adherence/Medication fill history: Care Gaps: Shingrix Vaccine COVID-19 Vaccine boost  Star-Rating Drugs: Atorvastatin 80 mg tablet Olmesartan 40 mg tablet  Rybelsus 7 mg tablet   Patient's preferred pharmacy is:  [facility], [place] [address] Phone: [phone] Fax: [phone]  [facility] ([place], [place] [address] [place] [address] Phone: [phone] Fax: [phone]  [facility] ([facility] ) - [place], [place] [address] Phone: [phone] Fax: [phone]  Uses pill box? Yes Pt endorses 95% compliance  We discussed: Current pharmacy is preferred with insurance plan and patient is satisfied with pharmacy services Patient decided to: Continue current medication management strategy  Care Plan and Follow Up Patient Decision:  Patient agrees to Care Plan and Follow-up.  Plan: The patient has been provided with contact information for the care management team and has been advised to call with any health related questions or concerns.   Orlando Penner, CPP, PharmD Clinical Pharmacist Practitioner [facility] [phone]

## 2021-03-26 DIAGNOSIS — N1832 Chronic kidney disease, stage 3b: Secondary | ICD-10-CM | POA: Diagnosis not present

## 2021-03-27 DIAGNOSIS — E785 Hyperlipidemia, unspecified: Secondary | ICD-10-CM

## 2021-03-27 DIAGNOSIS — J449 Chronic obstructive pulmonary disease, unspecified: Secondary | ICD-10-CM

## 2021-03-27 DIAGNOSIS — N1831 Chronic kidney disease, stage 3a: Secondary | ICD-10-CM | POA: Diagnosis not present

## 2021-03-27 DIAGNOSIS — I10 Essential (primary) hypertension: Secondary | ICD-10-CM | POA: Diagnosis not present

## 2021-03-27 DIAGNOSIS — E1159 Type 2 diabetes mellitus with other circulatory complications: Secondary | ICD-10-CM

## 2021-03-27 NOTE — Patient Instructions (Signed)
Visit Information It was great speaking with you today!  Please let me know if you have any questions about our visit.   Goals Addressed             This Visit's Progress    Manage My Medicine       Timeframe:  Long-Range Goal Priority:  High Start Date:                             Expected End Date:                       Follow Up Date 09/25/2021  In Progress:  - call for medicine refill 2 or 3 days before it runs out - call if I am sick and can't take my medicine - keep a list of all the medicines I take; vitamins and herbals too - use an alarm clock or phone to remind me to take my medicine    Why is this important?   These steps will help you keep on track with your medicines.           Patient Care Plan: CCM Pharmacy Care Plan     Problem Identified: HTN, HLD   Priority: High     Goal: Disease Management   Recent Progress: On track  Priority: High  Note:   Current Barriers:  Unable to independently monitor therapeutic efficacy  Pharmacist Clinical Goal(s):  Patient will achieve adherence to monitoring guidelines and medication adherence to achieve therapeutic efficacy through collaboration with PharmD and provider.   Interventions: 1:1 collaboration with [name], FNP regarding development and update of comprehensive plan of care as evidenced by provider attestation and co-signature Inter-disciplinary care team collaboration (see longitudinal plan of care) Comprehensive medication review performed; medication list updated in electronic medical record  Hypertension (BP goal <140/90) -Controlled -Current treatment: Olmesartan-hydrochlorothiazide 40-12.5 mg tablet once per day Appropriate, Effective, Safe, Accessible -Current home readings: she is checking her BP at home twice per day, her current readings: 128/83 Pulse: 69, BP: 129/65, 12th at 9:30 128/75 pulse: 84  -Current dietary habits: She does not use salt, and she uses a lot of garlic.   -Current exercise habits: she is not currently exercising as much due to [place].  -Denies hypotensive/hypertensive symptoms -Educated on Daily salt intake goal < 2300 mg; Proper BP monitoring technique; Symptoms of hypotension and importance of maintaining adequate hydration; -Counseled to monitor BP at home maybe once per week, document, and provide log at future appointments -Recommended to continue current medication  Hyperlipidemia: (LDL goal < 70) -Uncontrolled -Current treatment: Atorvastatin 80 mg tablet once per day Appropriate, Effective, Safe, Accessible -Current dietary patterns: she is eating plenty of vegetables, she does not eat a lot of meat. She eats a lot of chicken and fish -Current exercise habits: please see hypertension  -Educated on Cholesterol goals;  Importance of limiting foods high in cholesterol; -Recommended to continue current medication Collaborated with PCP team to add Zetia to patients current medicat  Patient Goals/Self-Care Activities Patient will:  - take medications as prescribed as evidenced by patient report and record review  Follow Up Plan: The patient has been provided with contact information for the care management team and has been advised to call with any health related questions or concerns.      Patient agreed to services and verbal consent obtained.   The patient verbalized understanding of instructions, educational  materials, and care plan provided today and agreed to receive a mailed copy of patient instructions, educational materials, and care plan.   Orlando Penner, PharmD Clinical Pharmacist [facility] [phone]

## 2021-04-04 DIAGNOSIS — E785 Hyperlipidemia, unspecified: Secondary | ICD-10-CM | POA: Diagnosis not present

## 2021-04-04 DIAGNOSIS — E1122 Type 2 diabetes mellitus with diabetic chronic kidney disease: Secondary | ICD-10-CM | POA: Diagnosis not present

## 2021-04-04 DIAGNOSIS — N1832 Chronic kidney disease, stage 3b: Secondary | ICD-10-CM | POA: Diagnosis not present

## 2021-04-04 DIAGNOSIS — I129 Hypertensive chronic kidney disease with stage 1 through stage 4 chronic kidney disease, or unspecified chronic kidney disease: Secondary | ICD-10-CM | POA: Diagnosis not present

## 2021-04-04 DIAGNOSIS — D631 Anemia in chronic kidney disease: Secondary | ICD-10-CM | POA: Diagnosis not present

## 2021-04-05 ENCOUNTER — Ambulatory Visit (INDEPENDENT_AMBULATORY_CARE_PROVIDER_SITE_OTHER): Payer: Medicare Other

## 2021-04-05 ENCOUNTER — Other Ambulatory Visit: Payer: Self-pay

## 2021-04-05 VITALS — BP 118/62 | HR 72 | Temp 98.0°F | Ht 66.0 in | Wt 179.6 lb

## 2021-04-05 DIAGNOSIS — Z0000 Encounter for general adult medical examination without abnormal findings: Secondary | ICD-10-CM | POA: Diagnosis not present

## 2021-04-05 NOTE — Progress Notes (Signed)
This visit occurred during the SARS-CoV-2 public health emergency.  Safety protocols were in place, including screening questions prior to the visit, additional usage of staff PPE, and extensive cleaning of exam room while observing appropriate contact time as indicated for disinfecting solutions.  Subjective:   Donna Gilmore is a 81 y.o. female who presents for Medicare Annual (Subsequent) preventive examination.  Review of Systems     Cardiac Risk Factors include: advanced age (>20mn, >>98women);diabetes mellitus;hypertension;dyslipidemia     Objective:    Today's Vitals   04/05/21 1149  BP: 118/62  Pulse: 72  Temp: 98 F (36.7 C)  TempSrc: Oral  Weight: 179 lb 9.6 oz (81.5 kg)  Height: '5\' 6"'$  (1.676 m)   Body mass index is 28.99 kg/m.  Advanced Directives 04/05/2021 07/26/2020 04/21/2020 06/30/2019 06/24/2018 10/22/2017 03/30/2014  Does Patient Have a Medical Advance Directive? Yes Yes Yes Yes Yes No No  Type of [place]aramed[place]of [place] will [place] - [place] will Living will;Healthcare Power of Attorney - -  Does patient want to make changes to medical advance directive? - - - - - - -  Copy of [place] Chart? No - copy requested No - copy requested - No - copy requested No - copy requested - -  Pre-existing out of facility DNR order (yellow form or pink MOST form) - - - - - - -    Current Medications (verified) Outpatient Encounter Medications as of 04/05/2021  Medication Sig   acetaminophen (TYLENOL) 500 MG tablet Take 1 tablet (500 mg total) by mouth every 6 (six) hours as needed.   albuterol (VENTOLIN HFA) 108 (90 Base) MCG/ACT inhaler Inhale 2 puffs into the lungs every 6 (six) hours as needed for wheezing or shortness of breath.   atorvastatin (LIPITOR) 80 MG tablet TAKE 1 TABLET BY MOUTH  DAILY   Azelastine HCl 137 MCG/SPRAY SOLN Place 2 sprays into the nose  daily.   Budeson-Glycopyrrol-Formoterol (BREZTRI AEROSPHERE) 160-9-4.8 MCG/ACT AERO USE 2 INHALATIONS BY MOUTH  DAILY   cholecalciferol (VITAMIN D3) 25 MCG (1000 UNIT) tablet Take 1,000 Units by mouth daily.   clopidogrel (PLAVIX) 75 MG tablet TAKE 1 TABLET BY MOUTH  DAILY   diazepam (VALIUM) 5 MG tablet Take 5 mg by mouth as needed for anxiety.   diclofenac Sodium (VOLTAREN) 1 % GEL Apply topically 4 (four) times daily.   donepezil (ARICEPT) 5 MG tablet TAKE 1 TABLET BY MOUTH  DAILY   gabapentin (NEURONTIN) 100 MG capsule TAKE 1 CAPSULE BY MOUTH 3  TIMES DAILY AS NEEDED   Ginkgo Biloba 40 MG TABS Take by mouth.   glucose blood (ONETOUCH ULTRA) test strip USE WITH METER TO CHECK  BLOOD SUGAR BEFORE  BREAKFAST AND BEFORE DINNER dx: e11.65   glucose blood (ONETOUCH ULTRA) test strip USE WITH METER TO CHECK  BLOOD SUGAR BEFORE  BREAKFAST AND BEFORE DINNER   levocetirizine (XYZAL) 5 MG tablet Take 1 tablet (5 mg total) by mouth every evening.   meclizine (ANTIVERT) 25 MG tablet TAKE 1 TABLET BY MOUTH 2  TIMES EVERY DAY AS NEEDED   olmesartan-hydrochlorothiazide (BENICAR HCT) 40-12.5 MG tablet TAKE 1 TABLET BY MOUTH  DAILY   Probiotic Product (PROBIOTIC-10 PO) Take by mouth. Take one tablet daily   vitamin B-12 (CYANOCOBALAMIN) 100 MCG tablet Take 100 mcg by mouth daily.   vitamin C (ASCORBIC ACID) 500 MG tablet Take 500 mg by mouth daily.  VITAMIN D PO Take 1 tablet by mouth daily.   magic mouthwash w/lidocaine SOLN Take 5 mLs by mouth 3 (three) times daily. 1 part benadryl, 1 part nystatin and 1 part lidocaine (Patient not taking: Reported on 04/05/2021)   OVER THE COUNTER MEDICATION    RYBELSUS 7 MG TABS TAKE 1 TABLET BY MOUTH  DAILY (Patient not taking: Reported on 04/05/2021)   Spacer/Aero-Holding Chambers DEVI 1 each by Does not apply route. Please dispense spacer. DX: J44.9   Facility-Administered Encounter Medications as of 04/05/2021  Medication   0.9 %  sodium chloride infusion     Allergies (verified) Patient has no known allergies.   History: Past Medical History:  Diagnosis Date   Benign paroxysmal positional vertigo 08/26/2012   CVA (cerebral infarction) 08/25/2012   Diabetes mellitus without complication ([place])    Dyslipidemia 08/25/2012   Essential hypertension, benign 08/25/2012   Hyperlipidemia    Hypertension    Meralgia paraesthetica, left 01/12/2018   Stroke [facility])    History reviewed. No pertinent surgical history. Family History  Problem Relation Age of Onset   Kidney failure Mother    Diabetes Mother    Alzheimer's disease Mother    Alzheimer's disease Father    Prostate cancer Father    Diabetes Maternal Grandmother    Diabetes Maternal Grandfather    Alzheimer's disease Paternal Grandmother    Alzheimer's disease Paternal Grandfather    Prostate cancer Paternal Uncle    Social History   Socioeconomic History   Marital status: Divorced    Spouse name: Not on file   Number of children: 4   Years of education: Not on file   Highest education level: Not on file  Occupational History   Occupation: retired  Tobacco Use   Smoking status: Former    Packs/day: 0.30    Years: 30.00    Pack years: 9.00    Types: Cigarettes    Quit date: 2019    Years since quitting: 4.1   Smokeless tobacco: Never  Vaping Use   Vaping Use: Never used  Substance and Sexual Activity   Alcohol use: Not Currently    Comment: occasional   Drug use: No   Sexual activity: Not Currently  Other Topics Concern   Not on file  Social History Narrative   Not on file   Social Determinants of Health   Financial Resource Strain: Low Risk    Difficulty of Paying Living Expenses: Not hard at all  Food Insecurity: No Food Insecurity   Worried About Charity fundraiser in the Last Year: Never true   [facility] in the Last Year: Never true  Transportation Needs: No Transportation Needs   Lack of Transportation (Medical): No   Lack of Transportation  (Non-Medical): No  Physical Activity: Inactive   Days of Exercise per Week: 0 days   Minutes of Exercise per Session: 0 min  Stress: No Stress Concern Present   Feeling of Stress : Not at all  Social Connections: Not on file    Tobacco Counseling Counseling given: Not Answered   Clinical Intake:  Pre-visit preparation completed: Yes  Pain : No/denies pain     Nutritional Status: BMI 25 -29 Overweight Nutritional Risks: None Diabetes: Yes  How often do you need to have someone help you when you read instructions, pamphlets, or other written materials from your doctor or pharmacy?: 1 - Never  Diabetic? Yes Nutrition Risk Assessment:  Has the patient had any N/V/D  within the last 2 months?  No  Does the patient have any non-healing wounds?  No  Has the patient had any unintentional weight loss or weight gain?  No   Diabetes:  Is the patient diabetic?  Yes  If diabetic, was a CBG obtained today?  No  Did the patient bring in their glucometer from home?  No  How often do you monitor your CBG's? Twice daily.   Financial Strains and Diabetes Management:  Are you having any financial strains with the device, your supplies or your medication? No .  Does the patient want to be seen by Chronic Care Management for management of their diabetes?  No  Would the patient like to be referred to a Nutritionist or for Diabetic Management?  No   Diabetic Exams:  Diabetic Eye Exam: Completed 10/19/2020 Diabetic Foot Exam: Completed 05/16/2020   Interpreter Needed?: No  Information entered by :: NAllen LPN   Activities of Daily Living In your present state of health, do you have any difficulty performing the following activities: 04/05/2021 07/26/2020  Hearing? Tempie Donning  Comment wears hearing aides wears hearing aides  Vision? Y N  Comment some blurriness -  Difficulty concentrating or making decisions? Tempie Donning  Walking or climbing stairs? Y Y  Dressing or bathing? N N  Doing errands,  shopping? N N  Preparing Food and eating ? N N  Using the Toilet? N N  In the past six months, have you accidently leaked urine? N Y  Do you have problems with loss of bowel control? N N  Managing your Medications? N N  Managing your Finances? N N  Housekeeping or managing your Housekeeping? N N  Some recent data might be hidden    Patient Care Team: [name], FNP as PCP - General (General Practice) [name], FNP (General Practice) Little, [name], RN as [place] any recent Medical Services you may have received from other than [facility] providers in the past year (date may be approximate).     Assessment:   This is a routine wellness examination for [place].  Hearing/Vision screen Vision Screening - Comments:: Regular eye exams, Dr. Katy Fitch  Dietary issues and exercise activities discussed: Current Exercise Habits: The patient does not participate in regular exercise at present   Goals Addressed             This Visit's Progress    Patient Stated       04/05/2021, wants to build up energy       Depression Screen PHQ 2/9 Scores 04/05/2021 07/26/2020 04/21/2020 06/30/2019 02/25/2019 10/26/2018 09/15/2018  PHQ - 2 Score 0 0 0 0 0 0 0  PHQ- 9 Score - - - - - - -    Fall Risk Fall Risk  04/05/2021 07/26/2020 04/21/2020 06/30/2019 02/25/2019  Falls in the past year? 0 0 0 0 0  Number falls in past yr: - - - - -  Comment - - - - -  Injury with Fall? - - - - -  Risk for fall due to : Medication side effect Medication side effect - Medication side effect -  Follow up Falls evaluation completed;Education provided;Falls prevention discussed Falls evaluation completed;Education provided;Falls prevention discussed - Falls evaluation completed;Education provided;Falls prevention discussed -    FALL RISK PREVENTION PERTAINING TO THE HOME:  Any stairs in or around the home? No  If so, are there any without handrails? N/a Home free of loose throw  rugs in walkways, pet beds, electrical cords, etc? No  Adequate lighting in your home to reduce risk of falls? No   ASSISTIVE DEVICES UTILIZED TO PREVENT FALLS:  Life alert? No  Use of a cane, walker or w/c? No  Grab bars in the bathroom? No  Shower chair or bench in shower? No  Elevated toilet seat or a handicapped toilet? Yes   TIMED UP AND GO:  Was the test performed? No .    Gait slow and steady without use of assistive device  Cognitive Function:     6CIT Screen 07/26/2020 06/30/2019 06/24/2018  What Year? 0 points 0 points 0 points  What month? 0 points 0 points 0 points  What time? 0 points 0 points 0 points  Count back from 20 0 points 0 points 0 points  Months in reverse 2 points 0 points 0 points  Repeat phrase 4 points 0 points 0 points  Total Score 6 0 0    Immunizations Immunization History  Administered Date(s) Administered   Fluad Quad(high Dose 65+) 10/19/2019, 01/16/2021   Influenza, High Dose Seasonal PF 11/05/2017   Influenza-Unspecified 10/06/2018   Moderna Sars-Covid-2 Vaccination 02/23/2019, 03/23/2019, 11/23/2019, 09/14/2020   Pneumococcal Conjugate-13 07/07/2018   Pneumococcal Polysaccharide-23 05/16/2020    TDAP status: Due, Education has been provided regarding the importance of this vaccine. Advised may receive this vaccine at local pharmacy or Health Dept. Aware to provide a copy of the vaccination record if obtained from local pharmacy or Health Dept. Verbalized acceptance and understanding.  Flu Vaccine status: Up to date  Pneumococcal vaccine status: Up to date  Covid-19 vaccine status: Completed vaccines  Qualifies for Shingles Vaccine? Yes   Zostavax completed No   Shingrix Completed?: No.    Education has been provided regarding the importance of this vaccine. Patient has been advised to call insurance company to determine out of pocket expense if they have not yet received this vaccine. Advised may also receive vaccine at local  pharmacy or Health Dept. Verbalized acceptance and understanding.  Screening Tests Health Maintenance  Topic Date Due   COVID-19 Vaccine (5 - Booster for Moderna series) 11/09/2020   Zoster Vaccines- Shingrix (1 of 2) 07/06/2021 (Originally 04/12/1959)   TETANUS/TDAP  07/26/2021 (Originally 04/12/1959)   FOOT EXAM  05/16/2021   HEMOGLOBIN A1C  07/17/2021   OPHTHALMOLOGY EXAM  10/19/2021   Pneumonia Vaccine 29+ Years old  Completed   INFLUENZA VACCINE  Completed   DEXA SCAN  Completed   HPV VACCINES  Aged Out    Health Maintenance  Health Maintenance Due  Topic Date Due   COVID-19 Vaccine (5 - Booster for Moderna series) 11/09/2020    Colorectal cancer screening: No longer required.   Mammogram status: No longer required due to age.  Bone Density status: Completed 02/14/2015.  Lung Cancer Screening: (Low Dose CT Chest recommended if Age 76-80 years, 30 pack-year currently smoking OR have quit w/in 15years.) does not qualify.   Lung Cancer Screening Referral: no  Additional Screening:  Hepatitis C Screening: does not qualify;   Vision Screening: Recommended annual ophthalmology exams for early detection of glaucoma and other disorders of the eye. Is the patient up to date with their annual eye exam?  Yes  Who is the provider or what is the name of the office in which the patient attends annual eye exams? Dr. Katy Fitch If pt is not established with a provider, would they like to be referred to a provider to establish care?  No .   Dental Screening: Recommended annual dental exams for proper oral hygiene  Community Resource Referral / Chronic Care Management: CRR required this visit?  No   CCM required this visit?  No      Plan:     I have personally reviewed and noted the following in the patients chart:   Medical and social history Use of alcohol, tobacco or illicit drugs  Current medications and supplements including opioid prescriptions.  Functional ability and  status Nutritional status Physical activity Advanced directives List of other physicians Hospitalizations, surgeries, and ER visits in previous 12 months Vitals Screenings to include cognitive, depression, and falls Referrals and appointments  In addition, I have reviewed and discussed with patient certain preventive protocols, quality metrics, and best practice recommendations. A written personalized care plan for preventive services as well as general preventive health recommendations were provided to patient.     [name], LPN   07/01/4032   Nurse Notes: 6 CIT not administered. Diagnosis of mild cognitive impairment

## 2021-04-05 NOTE — Patient Instructions (Signed)
[name] , Thank you for taking time to come for your Medicare Wellness Visit. I appreciate your ongoing commitment to your health goals. Please review the following plan we discussed and let me know if I can assist you in the future.   Screening recommendations/referrals: Colonoscopy: not required Mammogram: not required Bone Density: completed 02/14/2015 Recommended yearly ophthalmology/optometry visit for glaucoma screening and checkup Recommended yearly dental visit for hygiene and checkup  Vaccinations: Influenza vaccine: completed 01/16/2021, due next flu season Pneumococcal vaccine:  completed 05/16/2020 Tdap vaccine: due Shingles vaccine: discussed   Covid-19: 11/23/2019, 03/23/2019, 02/23/2019  Advanced directives: Please bring a copy of your POA (Power of Attorney) and/or Living Will to your next appointment.   Conditions/risks identified: none  Next appointment: Follow up in one year for your annual wellness visit    Preventive Care 65 Years and Older, Female Preventive care refers to lifestyle choices and visits with your health care provider that can promote health and wellness. What does preventive care include? A yearly physical exam. This is also called an annual well check. Dental exams once or twice a year. Routine eye exams. Ask your health care provider how often you should have your eyes checked. Personal lifestyle choices, including: Daily care of your teeth and gums. Regular physical activity. Eating a healthy diet. Avoiding tobacco and drug use. Limiting alcohol use. Practicing safe sex. Taking low-dose aspirin every day. Taking vitamin and mineral supplements as recommended by your health care provider. What happens during an annual well check? The services and screenings done by your health care provider during your annual well check will depend on your age, overall health, lifestyle risk factors, and family history of disease. Counseling  Your  health care provider may ask you questions about your: Alcohol use. Tobacco use. Drug use. Emotional well-being. Home and relationship well-being. Sexual activity. Eating habits. History of falls. Memory and ability to understand (cognition). Work and work Statistician. Reproductive health. Screening  You may have the following tests or measurements: Height, weight, and BMI. Blood pressure. Lipid and cholesterol levels. These may be checked every 5 years, or more frequently if you are over 39 years old. Skin check. Lung cancer screening. You may have this screening every year starting at age 35 if you have a 30-pack-year history of smoking and currently smoke or have quit within the past 15 years. Fecal occult blood test (FOBT) of the stool. You may have this test every year starting at age 82. Flexible sigmoidoscopy or colonoscopy. You may have a sigmoidoscopy every 5 years or a colonoscopy every 10 years starting at age 96. Hepatitis C blood test. Hepatitis B blood test. Sexually transmitted disease (STD) testing. Diabetes screening. This is done by checking your blood sugar (glucose) after you have not eaten for a while (fasting). You may have this done every 1-3 years. Bone density scan. This is done to screen for osteoporosis. You may have this done starting at age 54. Mammogram. This may be done every 1-2 years. Talk to your health care provider about how often you should have regular mammograms. Talk with your health care provider about your test results, treatment options, and if necessary, the need for more tests. Vaccines  Your health care provider may recommend certain vaccines, such as: Influenza vaccine. This is recommended every year. Tetanus, diphtheria, and acellular pertussis (Tdap, Td) vaccine. You may need a Td booster every 10 years. Zoster vaccine. You may need this after age 5. Pneumococcal 13-valent conjugate (PCV13) vaccine.  One dose is recommended after age  39. Pneumococcal polysaccharide (PPSV23) vaccine. One dose is recommended after age 67. Talk to your health care provider about which screenings and vaccines you need and how often you need them. This information is not intended to replace advice given to you by your health care provider. Make sure you discuss any questions you have with your health care provider. Document Released: 02/10/2015 Document Revised: 10/04/2015 Document Reviewed: 11/15/2014 Elsevier Interactive Patient Education  2017 [place] Prevention in the Home Falls can cause injuries. They can happen to people of all ages. There are many things you can do to make your home safe and to help prevent falls. What can I do on the outside of my home? Regularly fix the edges of walkways and driveways and fix any cracks. Remove anything that might make you trip as you walk through a door, such as a raised step or threshold. Trim any bushes or trees on the path to your home. Use bright outdoor lighting. Clear any walking paths of anything that might make someone trip, such as rocks or tools. Regularly check to see if handrails are loose or broken. Make sure that both sides of any steps have handrails. Any raised decks and porches should have guardrails on the edges. Have any leaves, snow, or ice cleared regularly. Use sand or salt on walking paths during winter. Clean up any spills in your garage right away. This includes oil or grease spills. What can I do in the bathroom? Use night lights. Install grab bars by the toilet and in the tub and shower. Do not use towel bars as grab bars. Use non-skid mats or decals in the tub or shower. If you need to sit down in the shower, use a plastic, non-slip stool. Keep the floor dry. Clean up any water that spills on the floor as soon as it happens. Remove soap buildup in the tub or shower regularly. Attach bath mats securely with double-sided non-slip rug tape. Do not have throw  rugs and other things on the floor that can make you trip. What can I do in the bedroom? Use night lights. Make sure that you have a light by your bed that is easy to reach. Do not use any sheets or blankets that are too big for your bed. They should not hang down onto the floor. Have a firm chair that has side arms. You can use this for support while you get dressed. Do not have throw rugs and other things on the floor that can make you trip. What can I do in the kitchen? Clean up any spills right away. Avoid walking on wet floors. Keep items that you use a lot in easy-to-reach places. If you need to reach something above you, use a strong step stool that has a grab bar. Keep electrical cords out of the way. Do not use floor polish or wax that makes floors slippery. If you must use wax, use non-skid floor wax. Do not have throw rugs and other things on the floor that can make you trip. What can I do with my stairs? Do not leave any items on the stairs. Make sure that there are handrails on both sides of the stairs and use them. Fix handrails that are broken or loose. Make sure that handrails are as long as the stairways. Check any carpeting to make sure that it is firmly attached to the stairs. Fix any carpet that is loose or  worn. Avoid having throw rugs at the top or bottom of the stairs. If you do have throw rugs, attach them to the floor with carpet tape. Make sure that you have a light switch at the top of the stairs and the bottom of the stairs. If you do not have them, ask someone to add them for you. What else can I do to help prevent falls? Wear shoes that: Do not have high heels. Have rubber bottoms. Are comfortable and fit you well. Are closed at the toe. Do not wear sandals. If you use a stepladder: Make sure that it is fully opened. Do not climb a closed stepladder. Make sure that both sides of the stepladder are locked into place. Ask someone to hold it for you, if  possible. Clearly mark and make sure that you can see: Any grab bars or handrails. First and last steps. Where the edge of each step is. Use tools that help you move around (mobility aids) if they are needed. These include: Canes. Walkers. Scooters. Crutches. Turn on the lights when you go into a dark area. Replace any light bulbs as soon as they burn out. Set up your furniture so you have a clear path. Avoid moving your furniture around. If any of your floors are uneven, fix them. If there are any pets around you, be aware of where they are. Review your medicines with your doctor. Some medicines can make you feel dizzy. This can increase your chance of falling. Ask your doctor what other things that you can do to help prevent falls. This information is not intended to replace advice given to you by your health care provider. Make sure you discuss any questions you have with your health care provider. Document Released: 11/10/2008 Document Revised: 06/22/2015 Document Reviewed: 02/18/2014 Elsevier Interactive Patient Education  2017 [employer].

## 2021-04-10 ENCOUNTER — Telehealth: Payer: Self-pay

## 2021-04-10 NOTE — Chronic Care Management (AMB) (Signed)
? ? ?  Chronic Care Management ?Pharmacy Assistant  ? ?Name: Donna Gilmore  MRN: [id number] DOB: 1940/07/07 ? ? ?Reason for Encounter: Onboard ?  ? ?Medications: ?Outpatient Encounter Medications as of 04/10/2021  ?Medication Sig  ? acetaminophen (TYLENOL) 500 MG tablet Take 1 tablet (500 mg total) by mouth every 6 (six) hours as needed.  ? albuterol (VENTOLIN HFA) 108 (90 Base) MCG/ACT inhaler Inhale 2 puffs into the lungs every 6 (six) hours as needed for wheezing or shortness of breath.  ? atorvastatin (LIPITOR) 80 MG tablet TAKE 1 TABLET BY MOUTH  DAILY  ? Azelastine HCl 137 MCG/SPRAY SOLN Place 2 sprays into the nose daily.  ? Budeson-Glycopyrrol-Formoterol (BREZTRI AEROSPHERE) 160-9-4.8 MCG/ACT AERO USE 2 INHALATIONS BY MOUTH  DAILY  ? cholecalciferol (VITAMIN D3) 25 MCG (1000 UNIT) tablet Take 1,000 Units by mouth daily.  ? clopidogrel (PLAVIX) 75 MG tablet TAKE 1 TABLET BY MOUTH  DAILY  ? diazepam (VALIUM) 5 MG tablet Take 5 mg by mouth as needed for anxiety.  ? diclofenac Sodium (VOLTAREN) 1 % GEL Apply topically 4 (four) times daily.  ? donepezil (ARICEPT) 5 MG tablet TAKE 1 TABLET BY MOUTH  DAILY  ? gabapentin (NEURONTIN) 100 MG capsule TAKE 1 CAPSULE BY MOUTH 3  TIMES DAILY AS NEEDED  ? Ginkgo Biloba 40 MG TABS Take by mouth.  ? glucose blood (ONETOUCH ULTRA) test strip USE WITH METER TO CHECK  BLOOD SUGAR BEFORE  BREAKFAST AND BEFORE DINNER dx: e11.65  ? glucose blood (ONETOUCH ULTRA) test strip USE WITH METER TO CHECK  BLOOD SUGAR BEFORE  BREAKFAST AND BEFORE DINNER  ? levocetirizine (XYZAL) 5 MG tablet Take 1 tablet (5 mg total) by mouth every evening.  ? magic mouthwash w/lidocaine SOLN Take 5 mLs by mouth 3 (three) times daily. 1 part benadryl, 1 part nystatin and 1 part lidocaine (Patient not taking: Reported on 04/05/2021)  ? meclizine (ANTIVERT) 25 MG tablet TAKE 1 TABLET BY MOUTH 2  TIMES EVERY DAY AS NEEDED  ? olmesartan-hydrochlorothiazide (BENICAR HCT) 40-12.5 MG tablet TAKE 1 TABLET BY MOUTH   DAILY  ? OVER THE COUNTER MEDICATION   ? Probiotic Product (PROBIOTIC-10 PO) Take by mouth. Take one tablet daily  ? RYBELSUS 7 MG TABS TAKE 1 TABLET BY MOUTH  DAILY (Patient not taking: Reported on 04/05/2021)  ? Spacer/Aero-Holding Chambers DEVI 1 each by Does not apply route. Please dispense spacer. DX: J44.9  ? vitamin B-12 (CYANOCOBALAMIN) 100 MCG tablet Take 100 mcg by mouth daily.  ? vitamin C (ASCORBIC ACID) 500 MG tablet Take 500 mg by mouth daily.  ? VITAMIN D PO Take 1 tablet by mouth daily.  ? ?Facility-Administered Encounter Medications as of 04/10/2021  ?Medication  ? 0.9 %  sodium chloride infusion  ? ?NOTES: ?Initiated onboarding to upstream process for patient. Patient's current mail order is preferred and medications are cheaper. Informed patient and she will like to stay with [facility] mail delivery. ? ? ? ?Jeannette How CMA ?Clinical Pharmacist Assistant ?[phone] ? ?

## 2021-04-16 ENCOUNTER — Encounter: Payer: Self-pay | Admitting: Nurse Practitioner

## 2021-04-18 ENCOUNTER — Ambulatory Visit (INDEPENDENT_AMBULATORY_CARE_PROVIDER_SITE_OTHER): Payer: Medicare Other | Admitting: Allergy

## 2021-04-18 ENCOUNTER — Encounter: Payer: Self-pay | Admitting: Allergy

## 2021-04-18 ENCOUNTER — Other Ambulatory Visit: Payer: Self-pay

## 2021-04-18 VITALS — BP 116/70 | HR 80 | Temp 96.8°F | Resp 16 | Ht 64.0 in | Wt 184.0 lb

## 2021-04-18 DIAGNOSIS — J310 Chronic rhinitis: Secondary | ICD-10-CM

## 2021-04-18 MED ORDER — AZELASTINE-FLUTICASONE 137-50 MCG/ACT NA SUSP: 1.0000 | Freq: Two times a day (BID) | NASAL | 5 refills | Status: DC

## 2021-04-18 NOTE — Progress Notes (Signed)
? ? ?New Patient Note ? ?RE: ARDITH TEST MRN: [id number] DOB: Sep 02, 1940 ?Date of Office Visit: 04/18/2021 ? ?Primary care provider: [name], FNP ? ?Chief Complaint: allergies ? ?History of present illness: ?Donna Gilmore is a 81 y.o. female presenting today for evaluation of allergic rhinitis.  She presents today with her daughter.  She is a former pt of practice last seen by Dr. Neldon Mc on 12/03/16.  She had environmental allergy testing via labwork at that time that was positive to dust mite, grass pollen however not enough blood was drawn to complete the entire panel.   ? ?She returns today as she has been having nasal congestion with clear drainage for past 6 months or longer.  Drainage is clear and it is worse in the morning and has a hard time clearing it.  Also having nasal itch,  itchy/watery eyes and itchy ears.  No sneezing.  ?In the morning she drinks hot eucalyptus tea and within 30 minutes she has improvement in nasal congestion however states she is blowing her nose and runny nose more.  She uses nasal spray Flonase or Nasacort.  She states she is not taking any antihistamines at this time.  ? ?Review of systems: ?Review of Systems  ?Constitutional: Negative.   ?HENT:    ?     See HPI  ?Eyes: Negative.   ?Respiratory: Negative.    ?Cardiovascular: Negative.   ?Gastrointestinal: Negative.   ?Musculoskeletal: Negative.   ?Skin: Negative.   ?Allergic/Immunologic: Negative.   ?Neurological: Negative.   ? ?All other systems negative unless noted above in HPI ? ?Past medical history: ?Past Medical History:  ?Diagnosis Date  ? Benign paroxysmal positional vertigo 08/26/2012  ? CVA (cerebral infarction) 08/25/2012  ? Diabetes mellitus without complication ([place])   ? Dyslipidemia 08/25/2012  ? Essential hypertension, benign 08/25/2012  ? Hyperlipidemia   ? Hypertension   ? Meralgia paraesthetica, left 01/12/2018  ? Stroke [facility])   ? ? ?Past surgical history: ?History reviewed. No pertinent surgical  history. ? ?Family history:  ?Family History  ?Problem Relation Age of Onset  ? Kidney failure Mother   ? Diabetes Mother   ? Alzheimer's disease Mother   ? Alzheimer's disease Father   ? Prostate cancer Father   ? Diabetes Maternal Grandmother   ? Diabetes Maternal Grandfather   ? Alzheimer's disease Paternal Grandmother   ? Alzheimer's disease Paternal Grandfather   ? Prostate cancer Paternal Uncle   ? ? ?Social history: ?Lives in a home with carpeting with gas heating and central cooling.  No pets in the home since 2020.  There is no concern for water damage, mildew or roaches in the home.  She quit smoking in December 2019 1 pack of menthol per day. ? ? ?Medication List: ?Current Outpatient Medications  ?Medication Sig Dispense Refill  ? acetaminophen (TYLENOL) 500 MG tablet Take 1 tablet (500 mg total) by mouth every 6 (six) hours as needed. 30 tablet 0  ? albuterol (VENTOLIN HFA) 108 (90 Base) MCG/ACT inhaler Inhale 2 puffs into the lungs every 6 (six) hours as needed for wheezing or shortness of breath. 8 g 6  ? atorvastatin (LIPITOR) 80 MG tablet TAKE 1 TABLET BY MOUTH  DAILY 90 tablet 3  ? Azelastine HCl 137 MCG/SPRAY SOLN Place 2 sprays into the nose daily. 30 mL 2  ? Budeson-Glycopyrrol-Formoterol (BREZTRI AEROSPHERE) 160-9-4.8 MCG/ACT AERO USE 2 INHALATIONS BY MOUTH  DAILY 32.1 g 2  ? cholecalciferol (VITAMIN D3) 25 MCG (1000  UNIT) tablet Take 1,000 Units by mouth daily.    ? clopidogrel (PLAVIX) 75 MG tablet TAKE 1 TABLET BY MOUTH  DAILY 90 tablet 3  ? diazepam (VALIUM) 5 MG tablet Take 5 mg by mouth as needed for anxiety.    ? diclofenac Sodium (VOLTAREN) 1 % GEL Apply topically 4 (four) times daily.    ? donepezil (ARICEPT) 5 MG tablet TAKE 1 TABLET BY MOUTH  DAILY 90 tablet 3  ? gabapentin (NEURONTIN) 100 MG capsule TAKE 1 CAPSULE BY MOUTH 3  TIMES DAILY AS NEEDED 270 capsule 3  ? Ginkgo Biloba 40 MG TABS Take by mouth.    ? glucose blood (ONETOUCH ULTRA) test strip USE WITH METER TO CHECK  BLOOD  SUGAR BEFORE  BREAKFAST AND BEFORE DINNER dx: e11.65 300 strip 3  ? glucose blood (ONETOUCH ULTRA) test strip USE WITH METER TO CHECK  BLOOD SUGAR BEFORE  BREAKFAST AND BEFORE DINNER 200 strip 3  ? levocetirizine (XYZAL) 5 MG tablet Take 1 tablet (5 mg total) by mouth every evening. 90 tablet 1  ? meclizine (ANTIVERT) 25 MG tablet TAKE 1 TABLET BY MOUTH 2  TIMES EVERY DAY AS NEEDED 90 tablet 1  ? olmesartan-hydrochlorothiazide (BENICAR HCT) 40-12.5 MG tablet TAKE 1 TABLET BY MOUTH  DAILY 90 tablet 3  ? Probiotic Product (PROBIOTIC-10 PO) Take by mouth. Take one tablet daily    ? Spacer/Aero-Holding Chambers DEVI 1 each by Does not apply route. Please dispense spacer. DX: J44.9    ? vitamin B-12 (CYANOCOBALAMIN) 100 MCG tablet Take 100 mcg by mouth daily.    ? vitamin C (ASCORBIC ACID) 500 MG tablet Take 500 mg by mouth daily.    ? VITAMIN D PO Take 1 tablet by mouth daily.    ? ?Current Facility-Administered Medications  ?Medication Dose Route Frequency Provider Last Rate Last Admin  ? 0.9 %  sodium chloride infusion  500 mL Intravenous Once [name], MD      ? ? ?Known medication allergies: ?No Known Allergies ? ? ?Physical examination: ?Blood pressure 116/70, pulse 80, temperature (!) 96.8 ?F (36 ?C), resp. rate 16, height '5\' 4"'$  (1.626 m), weight 184 lb (83.5 kg), SpO2 99 %. ? ?General: Alert, interactive, in no acute distress. ?HEENT: PERRLA, hearing aids in place, turbinates non-edematous with clear discharge, post-pharynx non erythematous. ?Neck: Supple without lymphadenopathy. ?Lungs: Clear to auscultation without wheezing, rhonchi or rales. {no increased work of breathing. ?CV: Normal S1, S2 without murmurs. ?Abdomen: Nondistended, nontender. ?Skin: Warm and dry, without lesions or rashes. ?Extremities:  No clubbing, cyanosis or edema. ?Neuro:   Grossly intact. ? ?Diagnositics/Labs: ?Allergy testing: Discussed skin prick testing and serum IgE testing and daughter would be best to proceed with serum IgE  testing ? ?Assessment and plan: ?Allergic rhinitis no significant postnasal drip  ?- Previous allergy testing was positive to dust mite and grass pollen however not enough blood was collected to complete the whole panel ?- I have ordered new environmental allergy panels we can see if she has more than dust mite and grass pollen allergy to help with avoidance measures ?- for nasal congestion and drainage would recommend a combination nasal spray like Dymista or Ryaltris.  These sprays have a nasal steroid for congestion and nasal antihistamine for drainage control.  Will send one of these in and see if insurance covers.  If not then we will prescribe Astelin separately for nasal drainage control and you can continue use of Nasacort for nasal congestion  control.   ?- for itching would use a long-acting antihistamine like Xyzal or Allegra daily as needed ? ?Follow-up in 4-6 months or sooner if needed ? ?I appreciate the opportunity to take part in Blimi's care. Please do not hesitate to contact me with questions. ? ?Sincerely, ? ? ?Prudy Feeler, MD ?Allergy/Immunology ?[facility] ?

## 2021-04-18 NOTE — Patient Instructions (Addendum)
-   Previous allergy testing was positive to dust mite and grass pollen however not enough blood was collected to complete the whole panel ?- I have ordered new environmental allergy panels we can see if she has more than dust mite and grass pollen allergy to help with avoidance measures ?- for nasal congestion and drainage would recommend a combination nasal spray like Dymista or Ryaltris.  These sprays have a nasal steroid for congestion and nasal antihistamine for drainage control.  Will send one of these in and see if insurance covers.  If not then we will prescribe Astelin separately for nasal drainage control and you can continue use of Nasacort for nasal congestion control.   ?- for itching would use a long-acting antihistamine like Xyzal or Allegra daily as needed ? ?Follow-up in 4-6 months or sooner if needed ? ?

## 2021-04-20 ENCOUNTER — Encounter: Payer: Self-pay | Admitting: Allergy

## 2021-04-20 LAB — ALLERGENS W/TOTAL IGE AREA 2
Alternaria Alternata IgE: <0.1 kU/L
Aspergillus Fumigatus IgE: <0.1 kU/L
Bermuda Grass IgE: <0.1 kU/L
Cat Dander IgE: <0.1 kU/L
Cedar, Mountain IgE: <0.1 kU/L
Cladosporium Herbarum IgE: <0.1 kU/L
Cockroach, German IgE: 1.03 kU/L — AB
Common Silver Birch IgE: <0.1 kU/L
Cottonwood IgE: <0.1 kU/L
D Farinae IgE: 0.35 kU/L — AB
D Pteronyssinus IgE: 0.32 kU/L — AB
Dog Dander IgE: <0.1 kU/L
Elm, American IgE: <0.1 kU/L
IgE (Immunoglobulin E), Serum: 440 IU/mL (ref 6–495)
Johnson Grass IgE: <0.1 kU/L
Maple/Box Elder IgE: <0.1 kU/L
Mouse Urine IgE: <0.1 kU/L
Oak, White IgE: <0.1 kU/L
Pecan, Hickory IgE: <0.1 kU/L
Penicillium Chrysogen IgE: <0.1 kU/L
Pigweed, Rough IgE: <0.1 kU/L
Ragweed, Short IgE: <0.1 kU/L
Sheep Sorrel IgE Qn: <0.1 kU/L
Timothy Grass IgE: <0.1 kU/L
White Mulberry IgE: <0.1 kU/L

## 2021-04-24 ENCOUNTER — Other Ambulatory Visit: Payer: Self-pay

## 2021-04-24 ENCOUNTER — Ambulatory Visit (HOSPITAL_BASED_OUTPATIENT_CLINIC_OR_DEPARTMENT_OTHER): Payer: Medicare Other | Admitting: Internal Medicine

## 2021-04-24 ENCOUNTER — Encounter (HOSPITAL_BASED_OUTPATIENT_CLINIC_OR_DEPARTMENT_OTHER): Payer: Self-pay | Admitting: Internal Medicine

## 2021-04-24 ENCOUNTER — Encounter: Payer: Self-pay | Admitting: Nurse Practitioner

## 2021-04-24 VITALS — BP 124/74 | HR 78 | Ht 65.0 in | Wt 181.4 lb

## 2021-04-24 DIAGNOSIS — E785 Hyperlipidemia, unspecified: Secondary | ICD-10-CM

## 2021-04-24 DIAGNOSIS — E119 Type 2 diabetes mellitus without complications: Secondary | ICD-10-CM | POA: Diagnosis not present

## 2021-04-24 DIAGNOSIS — I10 Essential (primary) hypertension: Secondary | ICD-10-CM | POA: Diagnosis not present

## 2021-04-24 DIAGNOSIS — Z8673 Personal history of transient ischemic attack (TIA), and cerebral infarction without residual deficits: Secondary | ICD-10-CM | POA: Diagnosis not present

## 2021-04-24 MED ORDER — EZETIMIBE 10 MG PO TABS: 10.0000 mg | ORAL_TABLET | Freq: Every day | ORAL | 3 refills | Status: DC

## 2021-04-24 MED ORDER — MECLIZINE HCL 25 MG PO TABS: ORAL_TABLET | ORAL | 1 refills | Status: DC

## 2021-04-24 NOTE — Progress Notes (Signed)
? ? ?LIPID CLINIC CONSULT NOTE ? ?Chief Complaint:  ?Manage dyslipidemia ? ?Primary Care Physician: ?Donna Brine, FNP ? ?Primary Cardiologist:  ?None ? ?HPI:  ?Donna Gilmore is a 81 y.o. female who is being seen today for the evaluation of dyslipidemia at the request of Donna Gilmore, [place].  This is a pleasant 81 year old female kindly referred for evaluation management of dyslipidemia.  Unfortunately she has type 2 diabetes, hypertension and dyslipidemia and has had a prior stroke.  She is referred for evaluation management of her cholesterol.  She has been compliant with atorvastatin 80 mg daily, but despite this her labs in December 2022 showed total cholesterol 232, triglycerides 379, HDL 50 and LDL 116.  Target LDL is less than 70 or possibly 55 given very high risk.  She will certainly need additional therapies.  We discussed diet at length today 2.  She does eat a lot of saut?ed vegetables such as collard greens and other meats that may be fried including fried fish at least once a week and ice cream on a regular basis.  All of these may be contributing to high triglycerides and elevated cholesterol. ? ?PMHx:  ?Past Medical History:  ?Diagnosis Date  ? Benign paroxysmal positional vertigo 08/26/2012  ? CVA (cerebral infarction) 08/25/2012  ? Diabetes mellitus without complication ([place])   ? Dyslipidemia 08/25/2012  ? Essential hypertension, benign 08/25/2012  ? Hyperlipidemia   ? Hypertension   ? Meralgia paraesthetica, left 01/12/2018  ? Stroke [facility])   ? ? ?No past surgical history on file. ? ?FAMHx:  ?Family History  ?Problem Relation Age of Onset  ? Kidney failure Mother   ? Diabetes Mother   ? Alzheimer's disease Mother   ? Alzheimer's disease Father   ? Prostate cancer Father   ? Diabetes Maternal Grandmother   ? Diabetes Maternal Grandfather   ? Alzheimer's disease Paternal Grandmother   ? Alzheimer's disease Paternal Grandfather   ? Prostate cancer Paternal Uncle   ? ? ?SOCHx:  ? reports that she quit  smoking about 4 years ago. Her smoking use included cigarettes. She has a 9.00 pack-year smoking history. She has never used smokeless tobacco. She reports that she does not currently use alcohol. She reports that she does not use drugs. ? ?ALLERGIES:  ?No Known Allergies ? ?ROS: ?Pertinent items noted in HPI and remainder of comprehensive ROS otherwise negative. ? ?HOME MEDS: ?Current Outpatient Medications on File Prior to Visit  ?Medication Sig Dispense Refill  ? acetaminophen (TYLENOL) 500 MG tablet Take 1 tablet (500 mg total) by mouth every 6 (six) hours as needed. 30 tablet 0  ? albuterol (VENTOLIN HFA) 108 (90 Base) MCG/ACT inhaler Inhale 2 puffs into the lungs every 6 (six) hours as needed for wheezing or shortness of breath. 8 g 6  ? atorvastatin (LIPITOR) 80 MG tablet TAKE 1 TABLET BY MOUTH  DAILY 90 tablet 3  ? Azelastine-Fluticasone (DYMISTA) 137-50 MCG/ACT SUSP Place 1 spray into the nose in the morning and at bedtime. 23 g 5  ? Budeson-Glycopyrrol-Formoterol (BREZTRI AEROSPHERE) 160-9-4.8 MCG/ACT AERO USE 2 INHALATIONS BY MOUTH  DAILY 32.1 g 2  ? cholecalciferol (VITAMIN D3) 25 MCG (1000 UNIT) tablet Take 1,000 Units by mouth daily.    ? clopidogrel (PLAVIX) 75 MG tablet TAKE 1 TABLET BY MOUTH  DAILY 90 tablet 3  ? diazepam (VALIUM) 5 MG tablet Take 5 mg by mouth as needed for anxiety.    ? diclofenac Sodium (VOLTAREN) 1 % GEL Apply  topically 4 (four) times daily.    ? donepezil (ARICEPT) 5 MG tablet TAKE 1 TABLET BY MOUTH  DAILY 90 tablet 3  ? gabapentin (NEURONTIN) 100 MG capsule TAKE 1 CAPSULE BY MOUTH 3  TIMES DAILY AS NEEDED 270 capsule 3  ? Ginkgo Biloba 40 MG TABS Take by mouth.    ? glucose blood (ONETOUCH ULTRA) test strip USE WITH METER TO CHECK  BLOOD SUGAR BEFORE  BREAKFAST AND BEFORE DINNER dx: e11.65 300 strip 3  ? glucose blood (ONETOUCH ULTRA) test strip USE WITH METER TO CHECK  BLOOD SUGAR BEFORE  BREAKFAST AND BEFORE DINNER 200 strip 3  ? levocetirizine (XYZAL) 5 MG tablet Take 1  tablet (5 mg total) by mouth every evening. 90 tablet 1  ? meclizine (ANTIVERT) 25 MG tablet TAKE 1 TABLET BY MOUTH 2  TIMES EVERY DAY AS NEEDED 90 tablet 1  ? olmesartan-hydrochlorothiazide (BENICAR HCT) 40-12.5 MG tablet TAKE 1 TABLET BY MOUTH  DAILY 90 tablet 3  ? Probiotic Product (PROBIOTIC-10 PO) Take by mouth. Take one tablet daily    ? Semaglutide (RYBELSUS) 7 MG TABS Take by mouth.    ? Spacer/Aero-Holding Chambers DEVI 1 each by Does not apply route. Please dispense spacer. DX: J44.9    ? vitamin B-12 (CYANOCOBALAMIN) 100 MCG tablet Take 100 mcg by mouth daily.    ? vitamin C (ASCORBIC ACID) 500 MG tablet Take 500 mg by mouth daily.    ? VITAMIN D PO Take 1 tablet by mouth daily.    ? ?Current Facility-Administered Medications on File Prior to Visit  ?Medication Dose Route Frequency Provider Last Rate Last Admin  ? 0.9 %  sodium chloride infusion  500 mL Intravenous Once Donna Shipper, MD      ? ? ?LABS/IMAGING: ?No results found for this or any previous visit (from the past 48 hour(s)). ?No results found. ? ?LIPID PANEL: ?   ?Component Value Date/Time  ? CHOL 232 (H) 01/16/2021 1247  ? TRIG 379 (H) 01/16/2021 1247  ? HDL 50 01/16/2021 1247  ? CHOLHDL 4.6 (H) 01/16/2021 1247  ? CHOLHDL 8.7 08/26/2012 0538  ? VLDL 60 (H) 08/26/2012 0538  ? LDLCALC 116 (H) 01/16/2021 1247  ? ? ?WEIGHTS: ?Wt Readings from Last 3 Encounters:  ?04/24/21 181 lb 6.4 oz (82.3 kg)  ?04/18/21 184 lb (83.5 kg)  ?04/05/21 179 lb 9.6 oz (81.5 kg)  ? ? ?VITALS: ?BP 124/74   Pulse 78   Ht '5\' 5"'$  (1.651 m)   Wt 181 lb 6.4 oz (82.3 kg)   SpO2 95%   BMI 30.19 kg/m?  ? ?EXAM: ?Deferred ? ?EKG: ?Deferred ? ?ASSESSMENT: ?Mixed dyslipidemia, goal LDL less than 55 ?History of stroke ?Type 2 diabetes ?Hypertension ? ?PLAN: ?1.   Donna Gilmore still has uncontrolled dyslipidemia.  There is likely dietary component to this, however despite being on high potency atorvastatin she is not at target.  Would advise adding ezetimibe 10 mg daily to her  regimen which hopefully will give an additional 20 to 25% reduction in her lipids.  We will plan to repeat lipid profile in about 3 to 4 months.  She may need additional therapies.  We have discussed ways that we can modify her diet as well to try to help with her cholesterol. ? ?Thanks again for the kind referral. ? ?[name], MD, [facility], FACP  ?[place]  ?Medical Director of the Advanced Lipid Disorders &  ?Cardiovascular Risk Reduction Clinic ?  Diplomate of the [employer] of Clinical Lipidology ?Attending Cardiologist  ?Direct Dial: [phone]  Fax: [phone]  ?Website:  [email or web address] ? ?Nadean Corwin Zackari Ruane ?04/24/2021, 1:08 PM  ?

## 2021-04-24 NOTE — Patient Instructions (Signed)
Medication Instructions:  ?START zetia '10mg'$  daily ? ?*If you need a refill on your cardiac medications before your next appointment, please call your pharmacy* ? ? ?Lab Work: ?FASTING lipid panel in about 4 months ? ?If you have labs (blood work) drawn today and your tests are completely normal, you will receive your results only by: ?MyChart Message (if you have MyChart) OR ?A paper copy in the mail ?If you have any lab test that is abnormal or we need to change your treatment, we will call you to review the results. ? ? ? ?Follow-Up: ?At [facility], you and your health needs are our priority.  As part of our continuing mission to provide you with exceptional heart care, we have created designated Provider Care Teams.  These Care Teams include your primary Cardiologist (physician) and Advanced Practice Providers (APPs -  Physician Assistants and Nurse Practitioners) who all work together to provide you with the care you need, when you need it. ? ?We recommend signing up for the patient portal called "MyChart".  Sign up information is provided on this After Visit Summary.  MyChart is used to connect with patients for Virtual Visits (Telemedicine).  Patients are able to view lab/test results, encounter notes, upcoming appointments, etc.  Non-urgent messages can be sent to your provider as well.   ?To learn more about what you can do with MyChart, go to [email or web address].   ? ?Your next appointment:   ?4 month(s) ? ?The format for your next appointment:   ?In Person ? ?Provider:   ?Dr. Lyman Bishop  ? ?

## 2021-05-22 ENCOUNTER — Encounter: Payer: Medicare Other | Admitting: Nurse Practitioner

## 2021-05-22 NOTE — Progress Notes (Signed)
Chmg-error.  

## 2021-05-24 ENCOUNTER — Telehealth: Payer: Medicare Other

## 2021-05-24 ENCOUNTER — Ambulatory Visit (INDEPENDENT_AMBULATORY_CARE_PROVIDER_SITE_OTHER): Payer: Medicare Other

## 2021-05-24 DIAGNOSIS — N1831 Chronic kidney disease, stage 3a: Secondary | ICD-10-CM

## 2021-05-24 DIAGNOSIS — I10 Essential (primary) hypertension: Secondary | ICD-10-CM

## 2021-05-24 DIAGNOSIS — J449 Chronic obstructive pulmonary disease, unspecified: Secondary | ICD-10-CM

## 2021-05-24 DIAGNOSIS — E1159 Type 2 diabetes mellitus with other circulatory complications: Secondary | ICD-10-CM

## 2021-05-25 NOTE — Chronic Care Management (AMB) (Signed)
?Chronic Care Management  ? ?CCM RN Visit Note ? ?05/24/2021 ?Name: Donna Gilmore MRN: [id number] DOB: [birth year] ? ?Subjective: ?Donna Gilmore is a 81 y.o. year old female who is a primary care patient of [name], [place]. The care management team was consulted for assistance with disease management and care coordination needs.   ? ?Engaged with patient by telephone for follow up visit in response to provider referral for case management and/or care coordination services.  ? ?Consent to Services:  ?The patient was given information about Chronic Care Management services, agreed to services, and gave verbal consent prior to initiation of services.  Please see initial visit note for detailed documentation.  ? ?Patient agreed to services and verbal consent obtained.  ? ?Assessment: Review of patient past medical history, allergies, medications, health status, including review of consultants reports, laboratory and other test data, was performed as part of comprehensive evaluation and provision of chronic care management services.  ? ?SDOH (Social Determinants of Health) assessments and interventions performed:  Yes, no acute changes  ? ?CCM Care Plan ? ?No Known Allergies ? ?Outpatient Encounter Medications as of 05/24/2021  ?Medication Sig  ? acetaminophen (TYLENOL) 500 MG tablet Take 1 tablet (500 mg total) by mouth every 6 (six) hours as needed.  ? albuterol (VENTOLIN HFA) 108 (90 Base) MCG/ACT inhaler Inhale 2 puffs into the lungs every 6 (six) hours as needed for wheezing or shortness of breath.  ? atorvastatin (LIPITOR) 80 MG tablet TAKE 1 TABLET BY MOUTH  DAILY  ? Azelastine-Fluticasone (DYMISTA) 137-50 MCG/ACT SUSP Place 1 spray into the nose in the morning and at bedtime.  ? Budeson-Glycopyrrol-Formoterol (BREZTRI AEROSPHERE) 160-9-4.8 MCG/ACT AERO USE 2 INHALATIONS BY MOUTH  DAILY  ? cholecalciferol (VITAMIN D3) 25 MCG (1000 UNIT) tablet Take 1,000 Units by mouth daily.  ? clopidogrel (PLAVIX) 75 MG  tablet TAKE 1 TABLET BY MOUTH  DAILY  ? diazepam (VALIUM) 5 MG tablet Take 5 mg by mouth as needed for anxiety.  ? diclofenac Sodium (VOLTAREN) 1 % GEL Apply topically 4 (four) times daily.  ? donepezil (ARICEPT) 5 MG tablet TAKE 1 TABLET BY MOUTH  DAILY  ? ezetimibe (ZETIA) 10 MG tablet Take 1 tablet (10 mg total) by mouth daily.  ? gabapentin (NEURONTIN) 100 MG capsule TAKE 1 CAPSULE BY MOUTH 3  TIMES DAILY AS NEEDED  ? Ginkgo Biloba 40 MG TABS Take by mouth.  ? glucose blood (ONETOUCH ULTRA) test strip USE WITH METER TO CHECK  BLOOD SUGAR BEFORE  BREAKFAST AND BEFORE DINNER dx: e11.65  ? glucose blood (ONETOUCH ULTRA) test strip USE WITH METER TO CHECK  BLOOD SUGAR BEFORE  BREAKFAST AND BEFORE DINNER  ? levocetirizine (XYZAL) 5 MG tablet Take 1 tablet (5 mg total) by mouth every evening.  ? meclizine (ANTIVERT) 25 MG tablet TAKE 1 TABLET BY MOUTH 2  TIMES EVERY DAY AS NEEDED  ? olmesartan-hydrochlorothiazide (BENICAR HCT) 40-12.5 MG tablet TAKE 1 TABLET BY MOUTH  DAILY  ? Probiotic Product (PROBIOTIC-10 PO) Take by mouth. Take one tablet daily  ? Semaglutide (RYBELSUS) 7 MG TABS Take by mouth.  ? Spacer/Aero-Holding Chambers DEVI 1 each by Does not apply route. Please dispense spacer. DX: J44.9  ? vitamin B-12 (CYANOCOBALAMIN) 100 MCG tablet Take 100 mcg by mouth daily.  ? vitamin C (ASCORBIC ACID) 500 MG tablet Take 500 mg by mouth daily.  ? VITAMIN D PO Take 1 tablet by mouth daily.  ? ?Facility-Administered Encounter Medications as of 05/24/2021  ?  Medication  ? 0.9 %  sodium chloride infusion  ? ? ?Patient Active Problem List  ? Diagnosis Date Noted  ? Chronic rhinitis 09/04/2020  ? COPD with asthma ([place]) 02/25/2019  ? Peripheral vascular disease ([place]) 02/25/2019  ? Chronic bilateral low back pain without sciatica 06/01/2018  ? Class 1 obesity due to excess calories without serious comorbidity with body mass index (BMI) of 32.0 to 32.9 in adult 06/01/2018  ? Acute left-sided low back pain with left-sided  sciatica 05/04/2018  ? Shortness of breath 05/04/2018  ? Meralgia paraesthetica, left 01/12/2018  ? Mild cognitive impairment 10/14/2016  ? History of stroke 10/14/2016  ? History of syphilis 10/14/2016  ? Mixed hyperlipidemia 10/14/2016  ? Insomnia 10/14/2016  ? Cerebellar stroke syndrome 10/23/2013  ? Smoker 02/16/2013  ? Chest pain 09/21/2012  ? Tobacco abuse disorder 09/21/2012  ? Benign paroxysmal positional vertigo 08/26/2012  ? CVA (cerebral infarction) 08/25/2012  ? Essential hypertension 08/25/2012  ? Dyslipidemia 08/25/2012  ? ? ?Conditions to be addressed/monitored: HTN, DM II, COPD, CKD III ? ?Care Plan : RN Care Manager Plan of Care  ?Updates made by Lynne Logan, RN since 05/24/2021 12:00 AM  ?  ? ?Problem: No Plan of Care established for management of chronic disease states (Essential hypertension, Type 2 diabetes mellitus, history of COPD, Stage 3 a Chronic Kidney disease, Mixed Hyperlipidemia)   ?Priority: High  ?  ? ?Long-Range Goal: Development of plan of care for chronic disease management (Essential hypertension, Type 2 diabetes mellitus, history of COPD, Stage 3 a Chronic Kidney disease, Mixed Hyperlipidemia)   ?Recent Progress: On track  ?Priority: High  ?Note:   ?Current Barriers:  ?Knowledge Deficits related to plan of care for management of Essential hypertension, Type 2 diabetes mellitus, history of COPD, Stage 3 a Chronic Kidney disease, Mixed Hyperlipidemia    ?Chronic Disease Management support and education needs related to Essential hypertension, Type 2 diabetes mellitus, history of COPD, Stage 3 a Chronic Kidney disease, Mixed Hyperlipidemia    ?HOH ? ?RNCM Clinical Goal(s):  ?Patient will verbalize basic understanding of  Essential hypertension, Type 2 diabetes mellitus, history of COPD, Stage 3 a Chronic Kidney disease, Mixed Hyperlipidemia   disease process and self health management plan as evidenced by patient will report having no disease exacerbations related to chronic  disease states listed above ?take all medications exactly as prescribed and will call provider for medication related questions as evidenced by patient will report having no missed doses of her prescribed medications ?demonstrate Improved health management independence as evidenced by patient will maintain the ability to perform self care ?continue to work with RN Care Manager to address care management and care coordination needs related to  Essential hypertension, Type 2 diabetes mellitus, history of COPD, Stage 3 a Chronic Kidney disease, Mixed Hyperlipidemia   as evidenced by adherence to CM Team Scheduled appointments ?demonstrate ongoing self health care management ability   as evidenced by    through collaboration with RN Care manager, provider, and care team.  ? ?Interventions: ?1:1 collaboration with primary care provider regarding development and update of comprehensive plan of care as evidenced by provider attestation and co-signature ?Inter-disciplinary care team collaboration (see longitudinal plan of care) ?Evaluation of current treatment plan related to  self management and patient's adherence to plan as established by provider ? ?  ? Chronic Kidney Disease Interventions:  (Status:  Goal on track:  Yes.) Long Term Goal ?Assessed the Patient understanding of chronic kidney disease    ?  Evaluation of current treatment plan related to chronic kidney disease self management and patient's adherence to plan as established by provider      ?Reviewed prescribed diet increase water to 48-64 oz daily  ?Advised patient, providing education and rationale, to monitor blood pressure daily and record, calling PCP for findings outside established parameters    ?Discussed complications of poorly controlled blood pressure such as heart disease, stroke, circulatory complications, vision complications, kidney impairment, sexual dysfunction    ?Provided education on kidney disease progression    ?Last practice recorded BP  readings:  ?BP Readings from Last 3 Encounters:  ?04/24/21 124/74  ?04/18/21 116/70  ?04/05/21 118/62  ?Most recent eGFR/CrCl:  ?Lab Results  ?Component Value Date  ? EGFR 48 (L) 01/16/2021  ?  No components found

## 2021-05-25 NOTE — Patient Instructions (Signed)
Visit Information ? ?Thank you for taking time to visit with me today. Please don't hesitate to contact me if I can be of assistance to you before our next scheduled telephone appointment. ? ?Following are the goals we discussed today:  ?(Copy and paste patient goals from clinical care plan here) ? ?Our next appointment is by telephone on 10/08/21 at 10:30 AM ? ?Please call the care guide team at [phone] if you need to cancel or reschedule your appointment.  ? ?If you are experiencing a Mental Health or [place] or need someone to talk to, please call [phone]-TALK (toll free, 24 hour hotline)  ? ?Patient verbalizes understanding of instructions and care plan provided today and agrees to view in [place]. Active MyChart status confirmed with patient.   ? ?Barb Merino, RN, BSN, CCM ?Care Management Coordinator ?[facility] [facility]  ?Direct Phone: [phone] ? ? ?

## 2021-05-27 DIAGNOSIS — E1159 Type 2 diabetes mellitus with other circulatory complications: Secondary | ICD-10-CM

## 2021-05-27 DIAGNOSIS — J449 Chronic obstructive pulmonary disease, unspecified: Secondary | ICD-10-CM

## 2021-05-27 DIAGNOSIS — I10 Essential (primary) hypertension: Secondary | ICD-10-CM | POA: Diagnosis not present

## 2021-05-27 DIAGNOSIS — N1831 Chronic kidney disease, stage 3a: Secondary | ICD-10-CM | POA: Diagnosis not present

## 2021-05-29 ENCOUNTER — Encounter: Payer: Self-pay | Admitting: Nurse Practitioner

## 2021-05-29 ENCOUNTER — Ambulatory Visit (INDEPENDENT_AMBULATORY_CARE_PROVIDER_SITE_OTHER): Payer: Medicare Other | Admitting: Nurse Practitioner

## 2021-05-29 VITALS — BP 130/70 | HR 82 | Temp 97.8°F | Ht 65.0 in | Wt 184.0 lb

## 2021-05-29 DIAGNOSIS — E1159 Type 2 diabetes mellitus with other circulatory complications: Secondary | ICD-10-CM | POA: Diagnosis not present

## 2021-05-29 DIAGNOSIS — E6609 Other obesity due to excess calories: Secondary | ICD-10-CM

## 2021-05-29 DIAGNOSIS — E1122 Type 2 diabetes mellitus with diabetic chronic kidney disease: Secondary | ICD-10-CM

## 2021-05-29 DIAGNOSIS — I129 Hypertensive chronic kidney disease with stage 1 through stage 4 chronic kidney disease, or unspecified chronic kidney disease: Secondary | ICD-10-CM

## 2021-05-29 DIAGNOSIS — E785 Hyperlipidemia, unspecified: Secondary | ICD-10-CM | POA: Diagnosis not present

## 2021-05-29 DIAGNOSIS — L299 Pruritus, unspecified: Secondary | ICD-10-CM | POA: Diagnosis not present

## 2021-05-29 DIAGNOSIS — N1831 Chronic kidney disease, stage 3a: Secondary | ICD-10-CM

## 2021-05-29 DIAGNOSIS — I10 Essential (primary) hypertension: Secondary | ICD-10-CM

## 2021-05-29 DIAGNOSIS — E66811 Obesity, class 1: Secondary | ICD-10-CM

## 2021-05-29 DIAGNOSIS — Z6830 Body mass index (BMI) 30.0-30.9, adult: Secondary | ICD-10-CM

## 2021-05-29 NOTE — Patient Instructions (Signed)

## 2021-05-29 NOTE — Progress Notes (Addendum)
I,Tianna Badgett,acting as a Education administrator for [employer], FNP.,have documented all relevant documentation on the behalf of [name], FNP,as directed by  [name], FNP while in the presence of [name], [place].  This visit occurred during the SARS-CoV-2 public health emergency.  Safety protocols were in place, including screening questions prior to the visit, additional usage of staff PPE, and extensive cleaning of exam room while observing appropriate contact time as indicated for disinfecting solutions.  Subjective:     Patient ID: Donna Gilmore , female    DOB: 07/18/1940 , 81 y.o.   MRN: [id number]   Chief Complaint  Patient presents with   Diabetes    HPI  Patient is here today for DM and HTN follow up. She is accompanied by caretaker, Delcie Roch. Reports she has been doing well.   Diabetes She presents for her follow-up diabetic visit. She has type 2 diabetes mellitus. Pertinent negatives for hypoglycemia include no headaches. There are no diabetic associated symptoms. Pertinent negatives for diabetes include no fatigue. There are no hypoglycemic complications. There are no diabetic complications. Risk factors for coronary artery disease include obesity and sedentary lifestyle. She is compliant with treatment all of the time. She is following a generally healthy diet. When asked about meal planning, she reported none. She has not had a previous visit with a dietitian. She rarely participates in exercise. (Blood sugars are ranging - 99-136. ) An ACE inhibitor/angiotensin II receptor blocker is being taken. She does not see a podiatrist.Eye exam is not current.  Hyperlipidemia This is a chronic problem. The current episode started more than 1 year ago. The problem is controlled. She has no history of chronic renal disease. There are no compliance problems.  Risk factors for coronary artery disease include a sedentary lifestyle, obesity, hypertension and diabetes mellitus.   Hypertension This is a chronic problem. The current episode started more than 1 year ago. The problem is unchanged. Pertinent negatives include no headaches. There are no associated agents to hypertension. Risk factors for coronary artery disease include sedentary lifestyle. Past treatments include diuretics and ACE inhibitors. There are no compliance problems.  Hypertensive end-organ damage includes angina. There is no history of chronic renal disease.    Past Medical History:  Diagnosis Date   Benign paroxysmal positional vertigo 08/26/2012   CVA (cerebral infarction) 08/25/2012   Diabetes mellitus without complication ([place])    Dyslipidemia 08/25/2012   Essential hypertension, benign 08/25/2012   Hyperlipidemia    Hypertension    Meralgia paraesthetica, left 01/12/2018   Stroke [facility])      Family History  Problem Relation Age of Onset   Kidney failure Mother    Diabetes Mother    Alzheimer's disease Mother    Alzheimer's disease Father    Prostate cancer Father    Diabetes Maternal Grandmother    Diabetes Maternal Grandfather    Alzheimer's disease Paternal Grandmother    Alzheimer's disease Paternal Grandfather    Prostate cancer Paternal Uncle      Current Outpatient Medications:    acetaminophen (TYLENOL) 500 MG tablet, Take 1 tablet (500 mg total) by mouth every 6 (six) hours as needed., Disp: 30 tablet, Rfl: 0   albuterol (VENTOLIN HFA) 108 (90 Base) MCG/ACT inhaler, Inhale 2 puffs into the lungs every 6 (six) hours as needed for wheezing or shortness of breath., Disp: 8 g, Rfl: 6   atorvastatin (LIPITOR) 80 MG tablet, TAKE 1 TABLET BY MOUTH  DAILY, Disp: 90 tablet, Rfl: 3  Azelastine-Fluticasone (DYMISTA) 137-50 MCG/ACT SUSP, Place 1 spray into the nose in the morning and at bedtime., Disp: 23 g, Rfl: 5   Budeson-Glycopyrrol-Formoterol (BREZTRI AEROSPHERE) 160-9-4.8 MCG/ACT AERO, USE 2 INHALATIONS BY MOUTH  DAILY, Disp: 32.1 g, Rfl: 2   cholecalciferol (VITAMIN D3) 25 MCG  (1000 UNIT) tablet, Take 1,000 Units by mouth daily., Disp: , Rfl:    clopidogrel (PLAVIX) 75 MG tablet, TAKE 1 TABLET BY MOUTH  DAILY, Disp: 90 tablet, Rfl: 3   diazepam (VALIUM) 5 MG tablet, Take 5 mg by mouth as needed for anxiety., Disp: , Rfl:    diclofenac Sodium (VOLTAREN) 1 % GEL, Apply topically 4 (four) times daily., Disp: , Rfl:    donepezil (ARICEPT) 5 MG tablet, TAKE 1 TABLET BY MOUTH  DAILY, Disp: 90 tablet, Rfl: 3   ezetimibe (ZETIA) 10 MG tablet, Take 1 tablet (10 mg total) by mouth daily., Disp: 90 tablet, Rfl: 3   gabapentin (NEURONTIN) 100 MG capsule, TAKE 1 CAPSULE BY MOUTH 3  TIMES DAILY AS NEEDED, Disp: 270 capsule, Rfl: 3   Ginkgo Biloba 40 MG TABS, Take by mouth., Disp: , Rfl:    glucose blood (ONETOUCH ULTRA) test strip, USE WITH METER TO CHECK  BLOOD SUGAR BEFORE  BREAKFAST AND BEFORE DINNER dx: e11.65, Disp: 300 strip, Rfl: 3   glucose blood (ONETOUCH ULTRA) test strip, USE WITH METER TO CHECK  BLOOD SUGAR BEFORE  BREAKFAST AND BEFORE DINNER, Disp: 200 strip, Rfl: 3   levocetirizine (XYZAL) 5 MG tablet, Take 1 tablet (5 mg total) by mouth every evening., Disp: 90 tablet, Rfl: 1   meclizine (ANTIVERT) 25 MG tablet, TAKE 1 TABLET BY MOUTH 2  TIMES EVERY DAY AS NEEDED, Disp: 180 tablet, Rfl: 1   olmesartan-hydrochlorothiazide (BENICAR HCT) 40-12.5 MG tablet, TAKE 1 TABLET BY MOUTH  DAILY, Disp: 90 tablet, Rfl: 3   Probiotic Product (PROBIOTIC-10 PO), Take by mouth. Take one tablet daily, Disp: , Rfl:    Semaglutide (RYBELSUS) 7 MG TABS, Take by mouth., Disp: , Rfl:    Spacer/Aero-Holding Chambers DEVI, 1 each by Does not apply route. Please dispense spacer. DX: J44.9, Disp: , Rfl:    vitamin B-12 (CYANOCOBALAMIN) 100 MCG tablet, Take 100 mcg by mouth daily., Disp: , Rfl:    vitamin C (ASCORBIC ACID) 500 MG tablet, Take 500 mg by mouth daily., Disp: , Rfl:    VITAMIN D PO, Take 1 tablet by mouth daily., Disp: , Rfl:   Current Facility-Administered Medications:    0.9 %   sodium chloride infusion, 500 mL, Intravenous, Once, [name], MD   No Known Allergies   Review of Systems  Constitutional: Negative.  Negative for fatigue.  Respiratory: Negative.    Cardiovascular: Negative.   Gastrointestinal: Negative.   Neurological:  Positive for numbness. Negative for headaches.       Numbness and tingling in feet.   Psychiatric/Behavioral: Negative.      Today's Vitals   05/29/21 1157  BP: 130/70  Pulse: 82  Temp: 97.8 F (36.6 C)  TempSrc: Oral  Weight: 184 lb (83.5 kg)  Height: _0  (1.651 m)   Body mass index is 30.62 kg/m.  Wt Readings from Last 3 Encounters:  05/29/21 184 lb (83.5 kg)  04/24/21 181 lb 6.4 oz (82.3 kg)  04/18/21 184 lb (83.5 kg)    Objective:  Physical Exam Vitals reviewed.  Constitutional:      General: She is not in acute distress.    Appearance: Normal  appearance. She is obese.  HENT:     Ears:     Comments: Hard of hearing Cardiovascular:     Rate and Rhythm: Normal rate and regular rhythm.     Pulses: Normal pulses.     Heart sounds: Normal heart sounds. No murmur heard. Pulmonary:     Effort: Pulmonary effort is normal. No respiratory distress.     Breath sounds: Normal breath sounds. No wheezing.  Skin:    General: Skin is warm and dry.     Capillary Refill: Capillary refill takes less than 2 seconds.  Neurological:     General: No focal deficit present.     Mental Status: She is alert and oriented to person, place, and time.     Cranial Nerves: No cranial nerve deficit.     Motor: No weakness.  Psychiatric:        Mood and Affect: Mood normal.        Behavior: Behavior normal.        Thought Content: Thought content normal.        Judgment: Judgment normal.        Assessment And Plan:     1. Hypertensive nephropathy Comments: Blood pressure is controlled, continue current medications - BMP8+EGFR  2. Type 2 diabetes mellitus with chronic kidney disease stage 3a, without long-term current  use of insulin (HCC) Chronic, controlled Continue with current medications Encouraged to limit intake of sugary foods and drinks Encouraged to increase physical activity to 150 minutes per week Diabetic foot exam done, decreased sensation to bilateral feet Encouraged to stay well hydrated and avoid NSAID - Ambulatory referral to Podiatry - Hemoglobin A1c - BMP8+EGFR  3. Dyslipidemia Comments: Stable, continue statin, tolerating well.  - Lipid panel  4. Pr[place] Comments: Will refer to Dermatology at patients request.  - Ambulatory referral to Dermatology  5.Class 1 obesity due to excess calories with serious comorbidity and body mass index (BMI) of 30.0 to 30.9 in adult She is encouraged to strive for BMI less than 30 to decrease cardiac risk. Advised to aim for at least 150 minutes of exercise per week.   Patient was given opportunity to ask questions. Patient verbalized understanding of the plan and was able to repeat key elements of the plan. All questions were answered to their satisfaction.  [name], FNP   I, [name], FNP, have reviewed all documentation for this visit. The documentation on 05/29/21 for the exam, diagnosis, procedures, and orders are all accurate and complete.   IF YOU HAVE BEEN REFERRED TO A SPECIALIST, IT MAY TAKE 1-2 WEEKS TO SCHEDULE/PROCESS THE REFERRAL. IF YOU HAVE NOT HEARD FROM US/SPECIALIST IN TWO WEEKS, PLEASE GIVE [place] A CALL AT [phone] X [phone].   THE PATIENT IS ENCOURAGED TO PRACTICE SOCIAL DISTANCING DUE TO THE COVID-19 PANDEMIC.

## 2021-05-30 LAB — LIPID PANEL
Chol/HDL Ratio: 2.5 ratio (ref 0.0–4.4)
Cholesterol, Total: 142 mg/dL (ref 100–199)
HDL: 56 mg/dL (ref >39)
LDL Chol Calc (NIH): 53 mg/dL (ref 0–99)
Triglycerides: 209 mg/dL — ABNORMAL HIGH (ref 0–149)
VLDL Cholesterol Cal: 33 mg/dL (ref 5–40)

## 2021-05-31 ENCOUNTER — Encounter: Payer: Self-pay | Admitting: Nurse Practitioner

## 2021-05-31 LAB — BMP8+EGFR
BUN/Creatinine Ratio: 8 — ABNORMAL LOW (ref 12–28)
BUN: 12 mg/dL (ref 8–27)
CO2: 23 mmol/L (ref 20–29)
Calcium: 9.7 mg/dL (ref 8.7–10.3)
Chloride: 99 mmol/L (ref 96–106)
Creatinine, Ser: 1.48 mg/dL — ABNORMAL HIGH (ref 0.57–1.00)
Glucose: 120 mg/dL — ABNORMAL HIGH (ref 70–99)
Potassium: 4.1 mmol/L (ref 3.5–5.2)
Sodium: 139 mmol/L (ref 134–144)
eGFR: 35 mL/min/{1.73_m2} — ABNORMAL LOW (ref >59)

## 2021-05-31 LAB — HEMOGLOBIN A1C
Est. average glucose Bld gHb Est-mCnc: 160 mg/dL
Hgb A1c MFr Bld: 7.2 % — ABNORMAL HIGH (ref 4.8–5.6)

## 2021-06-04 ENCOUNTER — Encounter: Payer: Self-pay | Admitting: Nurse Practitioner

## 2021-06-04 ENCOUNTER — Other Ambulatory Visit: Payer: Self-pay

## 2021-06-04 MED ORDER — MECLIZINE HCL 25 MG PO TABS: ORAL_TABLET | ORAL | 1 refills | Status: DC

## 2021-06-11 ENCOUNTER — Encounter: Payer: Self-pay | Admitting: Podiatry

## 2021-06-11 ENCOUNTER — Ambulatory Visit: Payer: Medicare Other | Admitting: Podiatry

## 2021-06-11 DIAGNOSIS — E782 Mixed hyperlipidemia: Secondary | ICD-10-CM | POA: Diagnosis not present

## 2021-06-11 DIAGNOSIS — M79674 Pain in right toe(s): Secondary | ICD-10-CM | POA: Diagnosis not present

## 2021-06-11 DIAGNOSIS — M79675 Pain in left toe(s): Secondary | ICD-10-CM | POA: Diagnosis not present

## 2021-06-11 DIAGNOSIS — R209 Unspecified disturbances of skin sensation: Secondary | ICD-10-CM

## 2021-06-11 DIAGNOSIS — I10 Essential (primary) hypertension: Secondary | ICD-10-CM | POA: Diagnosis not present

## 2021-06-11 DIAGNOSIS — B351 Tinea unguium: Secondary | ICD-10-CM

## 2021-06-11 DIAGNOSIS — E1142 Type 2 diabetes mellitus with diabetic polyneuropathy: Secondary | ICD-10-CM | POA: Diagnosis not present

## 2021-06-11 DIAGNOSIS — Z87891 Personal history of nicotine dependence: Secondary | ICD-10-CM

## 2021-06-11 DIAGNOSIS — E119 Type 2 diabetes mellitus without complications: Secondary | ICD-10-CM

## 2021-06-11 NOTE — Progress Notes (Signed)
Subjective: Donna Gilmore presents today for annual diabetic foot examination.  Patient relates 3 year h/o diabetes.  Patient denies any h/o foot wounds.  Patient endorses numbness, tingling, burning, or pins/needle sensation in feet.  Risk factors: diabetes, PAD, h/o CVA, HTN, COPD, hyperlipidemia, dyslipidemia, h/o tobacco use in remission.  PCP is Donna Brine, FNP , and last visit was May 29, 2021.  Past Medical History:  Diagnosis Date   Benign paroxysmal positional vertigo 08/26/2012   CVA (cerebral infarction) 08/25/2012   Diabetes mellitus without complication ([place])    Dyslipidemia 08/25/2012   Essential hypertension, benign 08/25/2012   Hyperlipidemia    Hypertension    Meralgia paraesthetica, left 01/12/2018   Stroke [facility])     Patient Active Problem List   Diagnosis Date Noted   Chronic rhinitis 09/04/2020   COPD with asthma ([place]) 02/25/2019   Peripheral vascular disease ([place]) 02/25/2019   Pain in left foot 08/21/2018   Chronic bilateral low back pain without sciatica 06/01/2018   Class 1 obesity due to excess calories without serious comorbidity with body mass index (BMI) of 32.0 to 32.9 in adult 06/01/2018   Acute left-sided low back pain with left-sided sciatica 05/04/2018   Shortness of breath 05/04/2018   Tinnitus, bilateral 03/18/2018   Meralgia paraesthetica, left 01/12/2018   Parotid mass 01/10/2017   Sensorineural hearing loss (SNHL) of both ears 01/10/2017   Mild cognitive impairment 10/14/2016   History of stroke 10/14/2016   History of syphilis 10/14/2016   Mixed hyperlipidemia 10/14/2016   Insomnia 10/14/2016   Cerebellar stroke syndrome 10/23/2013   Smoker 02/16/2013   Chest pain 09/21/2012   Tobacco abuse disorder 09/21/2012   Benign paroxysmal positional vertigo 08/26/2012   CVA (cerebral infarction) 08/25/2012   Essential hypertension 08/25/2012   Dyslipidemia 08/25/2012    History reviewed. No pertinent surgical history.  Current  Outpatient Medications on File Prior to Visit  Medication Sig Dispense Refill   acetaminophen (TYLENOL) 500 MG tablet Take 1 tablet (500 mg total) by mouth every 6 (six) hours as needed. 30 tablet 0   albuterol (VENTOLIN HFA) 108 (90 Base) MCG/ACT inhaler Inhale 2 puffs into the lungs every 6 (six) hours as needed for wheezing or shortness of breath. 8 g 6   atorvastatin (LIPITOR) 80 MG tablet TAKE 1 TABLET BY MOUTH  DAILY 90 tablet 3   Azelastine-Fluticasone (DYMISTA) 137-50 MCG/ACT SUSP Place 1 spray into the nose in the morning and at bedtime. 23 g 5   Budeson-Glycopyrrol-Formoterol (BREZTRI AEROSPHERE) 160-9-4.8 MCG/ACT AERO USE 2 INHALATIONS BY MOUTH  DAILY 32.1 g 2   cholecalciferol (VITAMIN D3) 25 MCG (1000 UNIT) tablet Take 1,000 Units by mouth daily.     clopidogrel (PLAVIX) 75 MG tablet TAKE 1 TABLET BY MOUTH  DAILY 90 tablet 3   diazepam (VALIUM) 5 MG tablet Take 5 mg by mouth as needed for anxiety.     diclofenac Sodium (VOLTAREN) 1 % GEL Apply topically 4 (four) times daily.     donepezil (ARICEPT) 5 MG tablet TAKE 1 TABLET BY MOUTH  DAILY 90 tablet 3   ezetimibe (ZETIA) 10 MG tablet Take 1 tablet (10 mg total) by mouth daily. 90 tablet 3   gabapentin (NEURONTIN) 100 MG capsule TAKE 1 CAPSULE BY MOUTH 3  TIMES DAILY AS NEEDED 270 capsule 3   Ginkgo Biloba 40 MG TABS Take by mouth.     glucose blood (ONETOUCH ULTRA) test strip USE WITH METER TO CHECK  BLOOD SUGAR BEFORE  BREAKFAST AND BEFORE DINNER 200 strip 3   levocetirizine (XYZAL) 5 MG tablet Take 1 tablet (5 mg total) by mouth every evening. 90 tablet 1   meclizine (ANTIVERT) 25 MG tablet TAKE 1 TABLET BY MOUTH 2  TIMES EVERY DAY AS NEEDED 180 tablet 1   olmesartan-hydrochlorothiazide (BENICAR HCT) 40-12.5 MG tablet TAKE 1 TABLET BY MOUTH  DAILY 90 tablet 3   Probiotic Product (PROBIOTIC-10 PO) Take by mouth. Take one tablet daily     Spacer/Aero-Holding Chambers DEVI 1 each by Does not apply route. Please dispense spacer. DX:  J44.9     vitamin B-12 (CYANOCOBALAMIN) 100 MCG tablet Take 100 mcg by mouth daily.     vitamin C (ASCORBIC ACID) 500 MG tablet Take 500 mg by mouth daily.     VITAMIN D PO Take 1 tablet by mouth daily.     Current Facility-Administered Medications on File Prior to Visit  Medication Dose Route Frequency Provider Last Rate Last Admin   0.9 %  sodium chloride infusion  500 mL Intravenous Once [name], MD         No Known Allergies  Social History   Occupational History   Occupation: retired  Tobacco Use   Smoking status: Former    Packs/day: 0.30    Years: 30.00    Pack years: 9.00    Types: Cigarettes    Quit date: 2019    Years since quitting: 4.3   Smokeless tobacco: Never  Vaping Use   Vaping Use: Never used  Substance and Sexual Activity   Alcohol use: Not Currently    Comment: occasional   Drug use: No   Sexual activity: Not Currently    Family History  Problem Relation Age of Onset   Kidney failure Mother    Diabetes Mother    Alzheimer's disease Mother    Alzheimer's disease Father    Prostate cancer Father    Diabetes Maternal Grandmother    Diabetes Maternal Grandfather    Alzheimer's disease Paternal Grandmother    Alzheimer's disease Paternal Grandfather    Prostate cancer Paternal Uncle     Immunization History  Administered Date(s) Administered   Fluad Quad(high Dose 65+) 10/19/2019, 01/16/2021   Influenza, High Dose Seasonal PF 11/05/2017   Influenza-Unspecified 10/06/2018   Moderna Sars-Covid-2 Vaccination 02/23/2019, 03/23/2019, 11/23/2019, 09/14/2020   Pfizer Covid-19 Vaccine Bivalent Booster 40yr & up 04/24/2021   Pneumococcal Conjugate-13 07/07/2018   Pneumococcal Polysaccharide-23 05/16/2020    Objective: There were no vitals filed for this visit.  Donna Gilmore is a pleasant 81y.o. female WD, WN in NAD. AAO X 3.  Vascular Examination: CFT <3 seconds b/l LE. Palpable DP pulse(s) b/l LE. Faintly palpable PT pulse(s) b/l LE.  Pedal hair absent. No pain with calf compression b/l. Lower extremity skin temperature gradient within normal limits. Trace edema noted BLE. No cyanosis or clubbing noted b/l LE.  Dermatological Examination: Pedal integument with normal turgor, texture and tone BLE. No open wounds b/l LE. No interdigital macerations noted b/l LE. Toenails 2-5 bilaterally and L hallux elongated, discolored, dystrophic, thickened, and crumbly with subungual debris and tenderness to dorsal palpation. Anonychia noted right great toe. Nailbed(s) epithelialized.   Neurological Examination: Pt has subjective symptoms of neuropathy. Protective sensation intact 5/5 intact bilaterally with 10g monofilament b/l. Vibratory sensation intact b/l.  Musculoskeletal Examination: Normal muscle strength 5/5 to all lower extremity muscle groups bilaterally. No pain, crepitus or joint limitation noted with ROM b/l LE. No gross  bony pedal deformities b/l. Patient ambulates independently without assistive aids.  Footwear Assessment: Does the patient wear appropriate shoes? Yes. Does the patient need inserts/orthotics? No.  A1c:      Latest Ref Rng & Units 05/29/2021    2:23 PM 01/16/2021   12:47 PM 09/25/2020   11:33 AM 06/27/2020    4:33 PM  Hemoglobin A1C  Hemoglobin-A1c 4.8 - 5.6 % 7.2   6.8   6.9   7.5     Assessment: 1. Pain due to onychomycosis of toenails of both feet   2. Mixed hyperlipidemia   3. Essential hypertension   4. Bilateral cold feet   5. Type 2 diabetes mellitus with diabetic polyneuropathy, without long-term current use of insulin ([place])   6. Encounter for diabetic foot exam ([place])     ADA Risk Categorization: Low Risk:  Patient has all of the following: Intact protective sensation No prior foot ulcer  No severe deformity Pedal pulses present  Plan: -Patient was evaluated and treated. All patient's and/or POA's questions/concerns answered on today's visit. -Ordered noninvasive arterial studies ABIs  with and without TBIs for b/l lower extremities. -Dispense information on diabetic neuropathy. -Diabetic foot examination performed today. -Continue diabetic foot care principles: inspect feet daily, monitor glucose as recommended by PCP and/or Endocrinologist, and follow prescribed diet per PCP, Endocrinologist and/or dietician. -Mycotic toenails 1-5 bilaterally were debrided in length and girth with sterile nail nippers and dremel without incident. -Patient/POA to call should there be question/concern in the interim.  Return in about 3 months (around 09/11/2021).  [name], DPM

## 2021-06-11 NOTE — Patient Instructions (Signed)
Diabetic Neuropathy ?Diabetic neuropathy refers to nerve damage that is caused by diabetes. Over time, people with diabetes can develop nerve damage throughout the body. There are several types of diabetic neuropathy: ?Peripheral neuropathy. This is the most common type of diabetic neuropathy. It damages the nerves that carry signals between the spinal cord and other parts of the body (peripheral nerves). This usually affects nerves in the feet, legs, hands, and arms. ?Autonomic neuropathy. This type causes damage to nerves that control involuntary functions (autonomic nerves). Involuntary functions are functions of the body that you do not control. They include heartbeat, body temperature, blood pressure, urination, digestion, sweating, sexual function, or response to changes in blood glucose. ?Focal neuropathy. This type of nerve damage affects one area of the body, such as an arm, a leg, or the face. The injury may involve one nerve or a small group of nerves. Focal neuropathy can be painful and unpredictable. It occurs most often in older adults with diabetes. This often develops suddenly, but usually improves over time and does not cause long-term problems. ?Proximal neuropathy. This type of nerve damage affects the nerves of the thighs, hips, buttocks, or legs. It causes severe pain, weakness, and muscle death (atrophy), usually in the thigh muscles. It is more common among older men and people who have type 2 diabetes. The length of recovery time may vary. ?What are the causes? ?Peripheral, autonomic, and focal neuropathies are caused by diabetes that is not well controlled with treatment. The cause of proximal neuropathy is not known, but it may be caused by inflammation related to uncontrolled blood glucose levels. ?What are the signs or symptoms? ?Peripheral neuropathy ?Peripheral neuropathy develops slowly over time. When the nerves of the feet and legs no longer work, you may experience: ?Burning,  stabbing, or aching pain in the legs or feet. ?Pain or cramping in the legs or feet. ?Loss of feeling (numbness) and inability to feel pressure or pain in the feet. This can lead to: ?Thick calluses or sores on areas of constant pressure. ?Ulcers. ?Reduced ability to feel temperature changes. ?Foot deformities. ?Muscle weakness. ?Loss of balance or coordination. ?Autonomic neuropathy ?The symptoms of autonomic neuropathy vary depending on which nerves are affected. Symptoms may include: ?Problems with digestion, such as: ?Nausea or vomiting. ?Poor appetite. ?Bloating. ?Diarrhea or constipation. ?Trouble swallowing. ?Losing weight without trying to. ?Problems with the heart, blood, and lungs, such as: ?Dizziness, especially when standing up. ?Fainting. ?Shortness of breath. ?Irregular heartbeat. ?Bladder problems, such as: ?Trouble starting or stopping urination. ?Leaking urine. ?Trouble emptying the bladder. ?Urinary tract infections (UTIs). ?Problems with other body functions, such as: ?Sweat. You may sweat too much or too little. ?Temperature. You might get hot easily. Or, you might feel cold more than usual. ?Sexual function. Men may not be able to get or maintain an erection. Women may have vaginal dryness and difficulty with arousal. ?Focal neuropathy ?Symptoms affect only one area of the body. Common symptoms include: ?Numbness. ?Tingling. ?Burning pain. ?Prickling feeling. ?Very sensitive skin. ?Weakness. ?Inability to move (paralysis). ?Muscle twitching. ?Muscles getting smaller (wasting). ?Poor coordination. ?Double or blurred vision. ?Proximal neuropathy ?Sudden, severe pain in the hip, thigh, or buttocks. Pain may spread from the back into the legs (sciatica). ?Pain and numbness in the arms and legs. ?Tingling. ?Loss of bladder control or bowel control. ?Weakness and wasting of thigh muscles. ?Difficulty getting up from a seated position. ?Abdominal swelling. ?Unexplained weight loss. ?How is this  diagnosed? ?Diagnosis varies depending on the type  of neuropathy your health care provider suspects. ?Peripheral neuropathy ?Your health care provider will do a neurologic exam. This exam checks your reflexes, how you move, and what you can feel. You may have other tests, such as: ?Blood tests. ?Tests of the fluid that surrounds the spinal cord (lumbar puncture). ?CT scan. ?MRI. ?Checking the nerves that control muscles (electromyogram, or EMG). ?Checking how quickly signals pass through your nerves (nerve conduction study). ?Checking a small piece of a nerve using a microscope (biopsy). ?Autonomic neuropathy ?You may have tests, such as: ?Tests to measure your blood pressure and heart rate. You may be secured to an exam table that moves you from a lying position to an upright position (table tilt test). ?Breathing tests to check your lungs. ?Tests to check how food moves through the digestive system (gastric emptying tests). ?Blood, sweat, or urine tests. ?Ultrasound of your bladder. ?Spinal fluid tests. ?Focal neuropathy ?This condition may be diagnosed with: ?A neurologic exam. ?CT scan. ?MRI. ?EMG. ?Nerve conduction study. ?Proximal neuropathy ?There is no test to diagnose this type of neuropathy. You may have tests to rule out other possible causes of this type of neuropathy. Tests may include: ?X-rays of your spine and lumbar region. ?Lumbar puncture. ?MRI. ?How is this treated? ?The goal of treatment is to keep nerve damage from getting worse. Treatment may include: ?Following your diabetes management plan. This will help keep your blood glucose level and your A1C level within your target range. This is the most important treatment. ?Using prescription pain medicine. ?Follow these instructions at home: ?Diabetes management ?Follow your diabetes management plan as told by your health care provider. ?Check your blood glucose levels. ?Keep your blood glucose in your target range. ?Have your A1C level checked at  least two times a year, or as often as told. ?Take over the counter and prescription medicines only as told by your health care provider. This includes insulin and diabetes medicine. ? ?Lifestyle ? ?Do not use any products that contain nicotine or tobacco, such as cigarettes, e-cigarettes, and chewing tobacco. If you need help quitting, ask your health care provider. ?Be physically active every day. Include strength training and balance exercises. ?Follow a healthy meal plan. ?Work with your health care provider to manage your blood pressure. ?General instructions ?Ask your health care provider if the medicine prescribed to you requires you to avoid driving or using machinery. ?Check your skin and feet every day for cuts, bruises, redness, blisters, or sores. ?Keep all follow-up visits. This is important. ?Contact a health care provider if: ?You have burning, stabbing, or aching pain in your legs or feet. ?You are unable to feel pressure or pain in your feet. ?You develop problems with digestion, such as: ?Nausea. ?Vomiting. ?Bloating. ?Constipation. ?Diarrhea. ?Abdominal pain. ?You have difficulty with urination, such as: ?Inability to control when you urinate (incontinence). ?Inability to completely empty the bladder (retention). ?You feel as if your heart is racing (palpitations). ?You feel dizzy, weak, or faint when you stand up. ?Get help right away if: ?You cannot urinate. ?You have sudden weakness or loss of coordination. ?You have trouble speaking. ?You have pain or pressure in your chest. ?You have an irregular heartbeat. ?You have sudden inability to move a part of your body. ?These symptoms may represent a serious problem that is an emergency. Do not wait to see if the symptoms will go away. Get medical help right away. Call your local emergency services (911 in the [place] Do not drive yourself to  the hospital. ?Summary ?Diabetic neuropathy is nerve damage that is caused by diabetes. It can cause numbness  and pain in the arms, legs, digestive tract, heart, and other body systems. ?This condition is treated by keeping your blood glucose level and your A1C level within your target range. This can help prevent neurop

## 2021-06-12 ENCOUNTER — Ambulatory Visit (INDEPENDENT_AMBULATORY_CARE_PROVIDER_SITE_OTHER): Payer: Medicare Other | Admitting: Nurse Practitioner

## 2021-06-12 ENCOUNTER — Ambulatory Visit (INDEPENDENT_AMBULATORY_CARE_PROVIDER_SITE_OTHER): Payer: Medicare Other

## 2021-06-12 ENCOUNTER — Encounter: Payer: Self-pay | Admitting: Nurse Practitioner

## 2021-06-12 ENCOUNTER — Telehealth: Payer: Medicare Other

## 2021-06-12 VITALS — BP 118/82 | HR 75 | Temp 98.1°F | Ht 65.0 in | Wt 183.6 lb

## 2021-06-12 DIAGNOSIS — J449 Chronic obstructive pulmonary disease, unspecified: Secondary | ICD-10-CM

## 2021-06-12 DIAGNOSIS — E1159 Type 2 diabetes mellitus with other circulatory complications: Secondary | ICD-10-CM

## 2021-06-12 DIAGNOSIS — R519 Headache, unspecified: Secondary | ICD-10-CM | POA: Diagnosis not present

## 2021-06-12 DIAGNOSIS — Z6830 Body mass index (BMI) 30.0-30.9, adult: Secondary | ICD-10-CM

## 2021-06-12 DIAGNOSIS — J3489 Other specified disorders of nose and nasal sinuses: Secondary | ICD-10-CM

## 2021-06-12 DIAGNOSIS — J0120 Acute ethmoidal sinusitis, unspecified: Secondary | ICD-10-CM

## 2021-06-12 DIAGNOSIS — I10 Essential (primary) hypertension: Secondary | ICD-10-CM

## 2021-06-12 DIAGNOSIS — N1831 Chronic kidney disease, stage 3a: Secondary | ICD-10-CM

## 2021-06-12 DIAGNOSIS — E661 Drug-induced obesity: Secondary | ICD-10-CM

## 2021-06-12 MED ORDER — AMOXICILLIN-POT CLAVULANATE 875-125 MG PO TABS: 1.0000 | ORAL_TABLET | Freq: Two times a day (BID) | ORAL | 0 refills | Status: AC

## 2021-06-12 MED ORDER — RYBELSUS 7 MG PO TABS: 1.0000 | ORAL_TABLET | Freq: Every day | ORAL | 1 refills | Status: DC

## 2021-06-12 NOTE — Patient Instructions (Signed)
Visit Information ? ?Thank you for taking time to visit with me today. Please don't hesitate to contact me if I can be of assistance to you before our next scheduled telephone appointment. ? ?Following are the goals we discussed today:  ?(Copy and paste patient goals from clinical care plan here) ? ?Our next appointment is by telephone on 09/12/21 at 10:30 AM ? ?Please call the care guide team at [phone] if you need to cancel or reschedule your appointment.  ? ?If you are experiencing a Mental Health or [place] or need someone to talk to, please call 1-800-273-TALK (toll free, 24 hour hotline)  ? ?Patient verbalizes understanding of instructions and care plan provided today and agrees to view in [place]. Active MyChart status confirmed with patient.   ? ?Barb Merino, RN, BSN, CCM ?Care Management Coordinator ?[place] [facility]  ?Direct Phone: [phone] ? ? ?

## 2021-06-12 NOTE — Chronic Care Management (AMB) (Signed)
Chronic Care Management   CCM RN Visit Note  06/12/2021 Name: Donna Gilmore MRN: [id number] DOB: 1940/07/23  Subjective: Donna Gilmore is a 81 y.o. year old female who is a primary care patient of [name], [place]. The care management team was consulted for assistance with disease management and care coordination needs.    Engaged with patient by telephone for follow up visit in response to provider referral for case management and/or care coordination services.   Consent to Services:  The patient was given information about Chronic Care Management services, agreed to services, and gave verbal consent prior to initiation of services.  Please see initial visit note for detailed documentation.   Patient agreed to services and verbal consent obtained.   Assessment: Review of patient past medical history, allergies, medications, health status, including review of consultants reports, laboratory and other test data, was performed as part of comprehensive evaluation and provision of chronic care management services.   SDOH (Social Determinants of Health) assessments and interventions performed:  Yes, no acute needs  CCM Care Plan  No Known Allergies  Outpatient Encounter Medications as of 06/12/2021  Medication Sig   acetaminophen (TYLENOL) 500 MG tablet Take 1 tablet (500 mg total) by mouth every 6 (six) hours as needed.   albuterol (VENTOLIN HFA) 108 (90 Base) MCG/ACT inhaler Inhale 2 puffs into the lungs every 6 (six) hours as needed for wheezing or shortness of breath.   atorvastatin (LIPITOR) 80 MG tablet TAKE 1 TABLET BY MOUTH  DAILY   Azelastine-Fluticasone (DYMISTA) 137-50 MCG/ACT SUSP Place 1 spray into the nose in the morning and at bedtime.   Budeson-Glycopyrrol-Formoterol (BREZTRI AEROSPHERE) 160-9-4.8 MCG/ACT AERO USE 2 INHALATIONS BY MOUTH  DAILY   cholecalciferol (VITAMIN D3) 25 MCG (1000 UNIT) tablet Take 1,000 Units by mouth daily.   clopidogrel (PLAVIX) 75 MG tablet  TAKE 1 TABLET BY MOUTH  DAILY   diazepam (VALIUM) 5 MG tablet Take 5 mg by mouth as needed for anxiety.   diclofenac Sodium (VOLTAREN) 1 % GEL Apply topically 4 (four) times daily.   donepezil (ARICEPT) 5 MG tablet TAKE 1 TABLET BY MOUTH  DAILY   ezetimibe (ZETIA) 10 MG tablet Take 1 tablet (10 mg total) by mouth daily.   gabapentin (NEURONTIN) 100 MG capsule TAKE 1 CAPSULE BY MOUTH 3  TIMES DAILY AS NEEDED   Ginkgo Biloba 40 MG TABS Take by mouth.   glucose blood (ONETOUCH ULTRA) test strip USE WITH METER TO CHECK  BLOOD SUGAR BEFORE  BREAKFAST AND BEFORE DINNER dx: e11.65   glucose blood (ONETOUCH ULTRA) test strip USE WITH METER TO CHECK  BLOOD SUGAR BEFORE  BREAKFAST AND BEFORE DINNER   levocetirizine (XYZAL) 5 MG tablet Take 1 tablet (5 mg total) by mouth every evening.   meclizine (ANTIVERT) 25 MG tablet TAKE 1 TABLET BY MOUTH 2  TIMES EVERY DAY AS NEEDED   olmesartan-hydrochlorothiazide (BENICAR HCT) 40-12.5 MG tablet TAKE 1 TABLET BY MOUTH  DAILY   Probiotic Product (PROBIOTIC-10 PO) Take by mouth. Take one tablet daily   Semaglutide (RYBELSUS) 7 MG TABS Take by mouth. (Patient not taking: Reported on 06/12/2021)   Spacer/Aero-Holding Josiah Lobo DEVI 1 each by Does not apply route. Please dispense spacer. DX: J44.9   vitamin B-12 (CYANOCOBALAMIN) 100 MCG tablet Take 100 mcg by mouth daily.   vitamin C (ASCORBIC ACID) 500 MG tablet Take 500 mg by mouth daily.   VITAMIN D PO Take 1 tablet by mouth daily.   Facility-Administered  Encounter Medications as of 06/12/2021  Medication   0.9 %  sodium chloride infusion    Patient Active Problem List   Diagnosis Date Noted   Chronic rhinitis 09/04/2020   COPD with asthma ([place]) 02/25/2019   Peripheral vascular disease ([place]) 02/25/2019   Pain in left foot 08/21/2018   Chronic bilateral low back pain without sciatica 06/01/2018   Class 1 obesity due to excess calories without serious comorbidity with body mass index (BMI) of 32.0 to 32.9 in  adult 06/01/2018   Acute left-sided low back pain with left-sided sciatica 05/04/2018   Shortness of breath 05/04/2018   Tinnitus, bilateral 03/18/2018   Meralgia paraesthetica, left 01/12/2018   Parotid mass 01/10/2017   Sensorineural hearing loss (SNHL) of both ears 01/10/2017   Mild cognitive impairment 10/14/2016   History of stroke 10/14/2016   History of syphilis 10/14/2016   Mixed hyperlipidemia 10/14/2016   Insomnia 10/14/2016   Cerebellar stroke syndrome 10/23/2013   Smoker 02/16/2013   Chest pain 09/21/2012   Tobacco abuse disorder 09/21/2012   Benign paroxysmal positional vertigo 08/26/2012   CVA (cerebral infarction) 08/25/2012   Essential hypertension 08/25/2012   Dyslipidemia 08/25/2012    Conditions to be addressed/monitored: HTN, DM II, COPD, CKD III  Care Plan : RN Care Manager Plan of Care  Updates made by Lynne Logan, RN since 06/12/2021 12:00 AM     Problem: No Plan of Care established for management of chronic disease states (Essential hypertension, Type 2 diabetes mellitus, history of COPD, Stage 3 a Chronic Kidney disease, Mixed Hyperlipidemia)   Priority: High     Long-Range Goal: Development of plan of care for chronic disease management (Essential hypertension, Type 2 diabetes mellitus, history of COPD, Stage 3 a Chronic Kidney disease, Mixed Hyperlipidemia)   Recent Progress: On track  Priority: High  Note:   Current Barriers:  Knowledge Deficits related to plan of care for management of Essential hypertension, Type 2 diabetes mellitus, history of COPD, Stage 3 a Chronic Kidney disease, Mixed Hyperlipidemia    Chronic Disease Management support and education needs related to Essential hypertension, Type 2 diabetes mellitus, history of COPD, Stage 3 a Chronic Kidney disease, Mixed Hyperlipidemia    HOH  RNCM Clinical Goal(s):  Patient will verbalize basic understanding of  Essential hypertension, Type 2 diabetes mellitus, history of COPD, Stage 3  a Chronic Kidney disease, Mixed Hyperlipidemia   disease process and self health management plan as evidenced by patient will report having no disease exacerbations related to chronic disease states listed above take all medications exactly as prescribed and will call provider for medication related questions as evidenced by patient will report having no missed doses of her prescribed medications demonstrate Improved health management independence as evidenced by patient will maintain the ability to perform self care continue to work with RN Care Manager to address care management and care coordination needs related to  Essential hypertension, Type 2 diabetes mellitus, history of COPD, Stage 3 a Chronic Kidney disease, Mixed Hyperlipidemia   as evidenced by adherence to CM Team Scheduled appointments demonstrate ongoing self health care management ability   as evidenced by    through collaboration with RN Care manager, provider, and care team.   Interventions: 1:1 collaboration with primary care provider regarding development and update of comprehensive plan of care as evidenced by provider attestation and co-signature Inter-disciplinary care team collaboration (see longitudinal plan of care) Evaluation of current treatment plan related to  self management and patient's adherence  to plan as established by provider   Chronic Kidney Disease Interventions:  (Status:  Goal on track:  Yes.) Long Term Goal Assessed the Patient understanding of chronic kidney disease    Evaluation of current treatment plan related to chronic kidney disease self management and patient's adherence to plan as established by provider      Reviewed prescribed diet increase water to 48-64 oz daily unless otherwise directed  Provided education on kidney disease progression    Last practice recorded BP readings:  BP Readings from Last 3 Encounters:  05/29/21 130/70  04/24/21 124/74  04/18/21 116/70  Most recent eGFR/CrCl:   Lab Results  Component Value Date   EGFR 35 (L) 05/29/2021    No components found for: CRCL  Diabetes Interventions:  (Status:  Goal on track:  Yes.) Long Term Goal Assessed patient's understanding of A1c goal: <6.5% Provided education to patient about basic DM disease process Reviewed medications with patient and discussed importance of medication adherence Advised patient, providing education and rationale, to check cbg daily before breakfast and at bedtime and record, calling PCP for findings outside established parameters Review of patient status, including review of consultants reports, relevant laboratory and other test results, and medications completed Assessed social determinant of health barriers Educated patient on dietary and exercise recommendations Determined patient plans to resume her water exercises, she has also inquired about participating in [employer] educational materials related Diabetes education Lab Results  Component Value Date   HGBA1C 7.2 (H) 05/29/2021   COPD Interventions:  (Status:  Condition stable.  Not addressed this visit.)  Long Term Goal Evaluation of current treatment plan related to COPD, self-management and patient's adherence to plan as established by provider Review of patient status, including review of consultant's reports, relevant laboratory and other test results, and medications completed. Reviewed medications with patient and discussed importance of medication adherence Provided education to patient re: avoiding breathing in cold air when possible by covering nose and mouth when entering cold air/wind  Discussed plans with patient for ongoing care management follow up and provided patient with direct contact information for care management team  Hyperlipidemia Interventions:  (Status:  Condition stable.  Not addressed this visit.) Long Term Goal Medication review performed; medication list updated in electronic medical  record.  Provider established cholesterol goals reviewed Reviewed importance of limiting foods high in cholesterol Reviewed exercise goals and target of 150 minutes per week Assessed social determinant of health barriers  Mailed printed educational materials related to Chair Exercises; [employer] Lipid Panel     Component Value Date/Time   CHOL 142 05/29/2021 1421   TRIG 209 (H) 05/29/2021 1421   HDL 56 05/29/2021 1421   CHOLHDL 2.5 05/29/2021 1421   CHOLHDL 8.7 08/26/2012 0538   VLDL 60 (H) 08/26/2012 0538   LDLCALC 53 05/29/2021 1421   LABVLDL 33 05/29/2021 1421    Lipid Panel     Component Value Date/Time   CHOL 232 (H) 01/16/2021 1247   TRIG 379 (H) 01/16/2021 1247   HDL 50 01/16/2021 1247   CHOLHDL 4.6 (H) 01/16/2021 1247   CHOLHDL 8.7 08/26/2012 0538   VLDL 60 (H) 08/26/2012 0538   LDLCALC 116 (H) 01/16/2021 1247   LABVLDL 66 (H) 01/16/2021 1247   Nasal congestion unrelieved with Mucinex:  (Status:  New goal.)  Short Term Goal Evaluation of current treatment plan related to  nasal congestion , self-management and patient's adherence to plan as established by provider  Determined patient is experiencing nasal congestion that has been unrelieved with Mucinex, she is also experiencing daily headaches, reports previously reporting symptoms to PCP Assessed for other symptoms suggestive of infection and or sinusitis, patient denies, she is noted to have hyper nasal pitch  Determined patient would like to have symptoms re-evaluated by PCP  Placed patient on PCP scheduled for today, 06/12/21 _0 :40 PM for evaluation of sinus symptoms and headache  Discussed plans with patient for ongoing care management follow up and provided patient with direct contact information for care management team   Patient Goals/Self-Care Activities: Take all medications as prescribed Attend all scheduled provider appointments Call pharmacy for medication refills 3-7 days in advance of running  out of medications Attend church or other social activities Perform all self care activities independently  Perform IADL's (shopping, preparing meals, housekeeping, managing finances) independently Call provider office for new concerns or questions  drink 6 to 8 glasses of water each day manage portion size limit outdoor activity during cold weather take medications for blood pressure exactly as prescribed adhere to prescribed diet: low trans/low saturated fat  Follow Up Plan:  Telephone follow up appointment with care management team member scheduled for:  09/12/21     Barb Merino, RN, BSN, CCM Care Management Coordinator [facility]  Direct Phone: [phone]

## 2021-06-12 NOTE — Progress Notes (Signed)
?I,Donna Gilmore,acting as a scribe for [name], FNP.,have documented all relevant documentation on the behalf of [name], FNP,as directed by  [name], FNP while in the presence of [name], [place].  ? ?This visit occurred during the SARS-CoV-2 public health emergency.  Safety protocols were in place, including screening questions prior to the visit, additional usage of staff PPE, and extensive cleaning of exam room while observing appropriate contact time as indicated for disinfecting solutions. ? ?Subjective:  ?  ? Patient ID: Donna Gilmore , female    DOB: May 07, 1940 , 81 y.o.   MRN: [id number] ? ? ?Chief Complaint  ?Patient presents with  ? Sinus Problem  ? ? ?HPI ? ?Pt presents today for sinus/ allergy & daily headaches. She reports her mose recent headache was this morning, she wakes up with a headache.  ? ?This morning she felt she could not breathe, she had a lot of mucus build up, she only took one mucus relief pill.  ? ?She reports taking a mucous relief  (guaifenasin)tablet last night. She had drainage out of her mouth and nose. She has been having symptoms of sinus pressure for a few weeks. She also has a headache.  ? ?She is not taking rybelsi ?  ? ?Past Medical History:  ?Diagnosis Date  ? Benign paroxysmal positional vertigo 08/26/2012  ? CVA (cerebral infarction) 08/25/2012  ? Diabetes mellitus without complication ([place])   ? Dyslipidemia 08/25/2012  ? Essential hypertension, benign 08/25/2012  ? Hyperlipidemia   ? Hypertension   ? Meralgia paraesthetica, left 01/12/2018  ? Stroke [facility])   ?  ? ?Family History  ?Problem Relation Age of Onset  ? Kidney failure Mother   ? Diabetes Mother   ? Alzheimer's disease Mother   ? Alzheimer's disease Father   ? Prostate cancer Father   ? Diabetes Maternal Grandmother   ? Diabetes Maternal Grandfather   ? Alzheimer's disease Paternal Grandmother   ? Alzheimer's disease Paternal Grandfather   ? Prostate cancer Paternal Uncle   ? ? ? ?Current  Outpatient Medications:  ?  acetaminophen (TYLENOL) 500 MG tablet, Take 1 tablet (500 mg total) by mouth every 6 (six) hours as needed., Disp: 30 tablet, Rfl: 0 ?  albuterol (VENTOLIN HFA) 108 (90 Base) MCG/ACT inhaler, Inhale 2 puffs into the lungs every 6 (six) hours as needed for wheezing or shortness of breath., Disp: 8 g, Rfl: 6 ?  amoxicillin-clavulanate (AUGMENTIN) 875-125 MG tablet, Take 1 tablet by mouth 2 (two) times daily for 7 days., Disp: 14 tablet, Rfl: 0 ?  atorvastatin (LIPITOR) 80 MG tablet, TAKE 1 TABLET BY MOUTH  DAILY, Disp: 90 tablet, Rfl: 3 ?  Azelastine-Fluticasone (DYMISTA) 137-50 MCG/ACT SUSP, Place 1 spray into the nose in the morning and at bedtime., Disp: 23 g, Rfl: 5 ?  Budeson-Glycopyrrol-Formoterol (BREZTRI AEROSPHERE) 160-9-4.8 MCG/ACT AERO, USE 2 INHALATIONS BY MOUTH  DAILY, Disp: 32.1 g, Rfl: 2 ?  cholecalciferol (VITAMIN D3) 25 MCG (1000 UNIT) tablet, Take 1,000 Units by mouth daily., Disp: , Rfl:  ?  clopidogrel (PLAVIX) 75 MG tablet, TAKE 1 TABLET BY MOUTH  DAILY, Disp: 90 tablet, Rfl: 3 ?  diazepam (VALIUM) 5 MG tablet, Take 5 mg by mouth as needed for anxiety., Disp: , Rfl:  ?  diclofenac Sodium (VOLTAREN) 1 % GEL, Apply topically 4 (four) times daily., Disp: , Rfl:  ?  donepezil (ARICEPT) 5 MG tablet, TAKE 1 TABLET BY MOUTH  DAILY, Disp: 90 tablet, Rfl: 3 ?  ezetimibe (ZETIA) 10 MG tablet, Take 1 tablet (10 mg total) by mouth daily., Disp: 90 tablet, Rfl: 3 ?  gabapentin (NEURONTIN) 100 MG capsule, TAKE 1 CAPSULE BY MOUTH 3  TIMES DAILY AS NEEDED, Disp: 270 capsule, Rfl: 3 ?  Ginkgo Biloba 40 MG TABS, Take by mouth., Disp: , Rfl:  ?  glucose blood (ONETOUCH ULTRA) test strip, USE WITH METER TO CHECK  BLOOD SUGAR BEFORE  BREAKFAST AND BEFORE DINNER, Disp: 200 strip, Rfl: 3 ?  levocetirizine (XYZAL) 5 MG tablet, Take 1 tablet (5 mg total) by mouth every evening., Disp: 90 tablet, Rfl: 1 ?  meclizine (ANTIVERT) 25 MG tablet, TAKE 1 TABLET BY MOUTH 2  TIMES EVERY DAY AS NEEDED,  Disp: 180 tablet, Rfl: 1 ?  olmesartan-hydrochlorothiazide (BENICAR HCT) 40-12.5 MG tablet, TAKE 1 TABLET BY MOUTH  DAILY, Disp: 90 tablet, Rfl: 3 ?  Probiotic Product (PROBIOTIC-10 PO), Take by mouth. Take one tablet daily, Disp: , Rfl:  ?  Spacer/Aero-Holding Chambers DEVI, 1 each by Does not apply route. Please dispense spacer. DX: J44.9, Disp: , Rfl:  ?  vitamin B-12 (CYANOCOBALAMIN) 100 MCG tablet, Take 100 mcg by mouth daily., Disp: , Rfl:  ?  vitamin C (ASCORBIC ACID) 500 MG tablet, Take 500 mg by mouth daily., Disp: , Rfl:  ?  VITAMIN D PO, Take 1 tablet by mouth daily., Disp: , Rfl:  ?  Semaglutide (RYBELSUS) 7 MG TABS, Take 1 tablet by mouth daily., Disp: 90 tablet, Rfl: 1 ? ?Current Facility-Administered Medications:  ?  0.9 %  sodium chloride infusion, 500 mL, Intravenous, Once, [name], MD  ? ?No Known Allergies  ? ?Review of Systems  ?Constitutional: Negative.   ?Respiratory: Negative.  Negative for cough and wheezing.   ?Cardiovascular: Negative.  Negative for chest pain, palpitations and leg swelling.  ?Neurological:  Positive for headaches. Negative for dizziness.  ?Psychiatric/Behavioral: Negative.     ? ?Today's Vitals  ? 06/12/21 1620  ?BP: 118/82  ?Pulse: 75  ?Temp: 98.1 ?F (36.7 ?C)  ?SpO2: 98%  ?Weight: 183 lb 9.6 oz (83.3 kg)  ?Height: '5\' 5"'$  (1.651 m)  ? ?Body mass index is 30.55 kg/m?.  ?Wt Readings from Last 3 Encounters:  ?06/12/21 183 lb 9.6 oz (83.3 kg)  ?05/29/21 184 lb (83.5 kg)  ?04/24/21 181 lb 6.4 oz (82.3 kg)  ?  ?Objective:  ?Physical Exam ?Constitutional:   ?   General: She is not in acute distress. ?   Appearance: Normal appearance. She is obese.  ?HENT:  ?   Head: Normocephalic.  ?   Nose: Congestion and rhinorrhea present. No nasal tenderness.  ?   Right Sinus: Frontal sinus tenderness present.  ?   Left Sinus: Frontal sinus tenderness present.  ?   Comments: Ethmoid sinus tenderness ?Neck:  ?   Comments: Bilateral submandibular lymphadenopathy ?Cardiovascular:  ?   Rate  and Rhythm: Normal rate and regular rhythm.  ?   Pulses: Normal pulses.  ?   Heart sounds: Normal heart sounds. No murmur heard. ?Pulmonary:  ?   Effort: Pulmonary effort is normal. No respiratory distress.  ?   Breath sounds: No wheezing.  ?Lymphadenopathy:  ?   Cervical: Cervical adenopathy present.  ?Skin: ?   General: Skin is warm and dry.  ?Neurological:  ?   General: No focal deficit present.  ?   Mental Status: She is alert and oriented to person, place, and time.  ?   Cranial Nerves: No cranial  nerve deficit.  ?   Motor: No weakness.  ?Psychiatric:     ?   Mood and Affect: Mood normal.     ?   Behavior: Behavior normal.     ?   Thought Content: Thought content normal.     ?   Judgment: Judgment normal.  ?  ? ?   ?Assessment And Plan:  ?   ?1. Acute non-recurrent ethmoidal sinusitis ?Comments: Tenderness to ethmoid sinuses as well as frontal area. Will treat with antibiotics. Continue current medication for allergies ?- amoxicillin-clavulanate (AUGMENTIN) 875-125 MG tablet; Take 1 tablet by mouth 2 (two) times daily for 7 days.  Dispense: 14 tablet; Refill: 0 ? ?2. Sinus headache ?Comments: Continue allergy medications ? ?3. Drainage from nose ?Comments: Likely related to taking Guaifenasin and seasonal allergies ? ?4. Type 2 diabetes mellitus with other circulatory complication, without long-term current use of insulin ([place]) ?Comments: She has not been taking her Rybelsus, will restart at 3 mg daily. Sent Rx for 7 mg to mail order ?- Semaglutide (RYBELSUS) 7 MG TABS; Take 1 tablet by mouth daily.  Dispense: 90 tablet; Refill: 1 ? ?5. Class 1 drug-induced obesity without serious comorbidity with body mass index (BMI) of 30.0 to 30.9 in adult ?She is encouraged to strive for BMI less than 30 to decrease cardiac risk. Advised to aim for at least 150 minutes of exercise per week.   ? ? ?Patient was given opportunity to ask questions. Patient verbalized understanding of the plan and was able to repeat key  elements of the plan. All questions were answered to their satisfaction.  ?[name], FNP  ? ?I, [name], FNP, have reviewed all documentation for this visit. The documentation on 06/12/21 for the exam,

## 2021-06-12 NOTE — Patient Instructions (Signed)

## 2021-06-27 DIAGNOSIS — N183 Chronic kidney disease, stage 3 unspecified: Secondary | ICD-10-CM | POA: Diagnosis not present

## 2021-06-27 DIAGNOSIS — E1122 Type 2 diabetes mellitus with diabetic chronic kidney disease: Secondary | ICD-10-CM | POA: Diagnosis not present

## 2021-06-27 DIAGNOSIS — Z87891 Personal history of nicotine dependence: Secondary | ICD-10-CM | POA: Diagnosis not present

## 2021-06-27 DIAGNOSIS — J449 Chronic obstructive pulmonary disease, unspecified: Secondary | ICD-10-CM | POA: Diagnosis not present

## 2021-06-27 DIAGNOSIS — E785 Hyperlipidemia, unspecified: Secondary | ICD-10-CM | POA: Diagnosis not present

## 2021-07-04 ENCOUNTER — Encounter: Payer: Self-pay | Admitting: Nurse Practitioner

## 2021-07-09 ENCOUNTER — Other Ambulatory Visit: Payer: Self-pay | Admitting: Podiatry

## 2021-07-09 ENCOUNTER — Ambulatory Visit (HOSPITAL_COMMUNITY)
Admission: RE | Admit: 2021-07-09 | Discharge: 2021-07-09 | Disposition: A | Discharge status: Home / Self Care | Payer: Medicare Other | Source: Ambulatory Visit | Attending: Podiatry | Admitting: Podiatry

## 2021-07-09 DIAGNOSIS — R209 Unspecified disturbances of skin sensation: Secondary | ICD-10-CM

## 2021-07-09 DIAGNOSIS — E1142 Type 2 diabetes mellitus with diabetic polyneuropathy: Secondary | ICD-10-CM | POA: Diagnosis not present

## 2021-07-09 DIAGNOSIS — I10 Essential (primary) hypertension: Secondary | ICD-10-CM

## 2021-07-09 DIAGNOSIS — I739 Peripheral vascular disease, unspecified: Secondary | ICD-10-CM | POA: Diagnosis not present

## 2021-07-16 ENCOUNTER — Other Ambulatory Visit: Payer: Self-pay | Admitting: Nurse Practitioner

## 2021-07-16 DIAGNOSIS — R202 Paresthesia of skin: Secondary | ICD-10-CM

## 2021-07-17 ENCOUNTER — Ambulatory Visit (HOSPITAL_BASED_OUTPATIENT_CLINIC_OR_DEPARTMENT_OTHER): Payer: Medicare Other | Admitting: Internal Medicine

## 2021-07-23 ENCOUNTER — Telehealth: Payer: Self-pay

## 2021-07-23 NOTE — Chronic Care Management (AMB) (Signed)
Chronic Care Management Pharmacy Assistant   Name: Donna Gilmore  MRN: [id number] DOB: 1940-09-02  Reason for Encounter: Disease State/ Diabetes  Recent office visits:  06-12-2021 Donna Gilmore, [place]. START augmentin 875-125 mg twice daily for 7 days.  06-12-2021 Gilmore, Donna Haring, [name].  05-24-2021 Gilmore, Donna Haring, [name].  04-05-2021 Donna Simmering, LPN. Medicare annual visit.  05-29-2021 Donna Gilmore, [place]. Glucose= 120, Creatinine= 1.48, eGFR= 35, BUN/Creatinine Ratio= 8. A1C= 7.2. Trig= 209. Referral placed to podiatry and dermatology.  Recent consult visits:  06-11-2021 Donna Gilmore, DPM (Podiatry). Mycotic toenails 1-5 bilaterally were debrided in length and girth with sterile nail nippers and dremel without incident.  04-24-2021 Donna Casino, MD (Cardiology). START zetia 10 mg daily.  04-18-2021 Donna Gain, MD (Allergy). D Pteronyssinus IgE= 0.32, D Farinae IgE= 0.35, Cockroach, [place] IgE= 1.03. STOP magic mouthwash and rybelsus.  04-04-2021 Donna Fire, MD (Nephrology). Unable to view encounter.  Hospital visits:  None in previous 6 months  Medications: Outpatient Encounter Medications as of 07/23/2021  Medication Sig   acetaminophen (TYLENOL) 500 MG tablet Take 1 tablet (500 mg total) by mouth every 6 (six) hours as needed.   albuterol (VENTOLIN HFA) 108 (90 Base) MCG/ACT inhaler Inhale 2 puffs into the lungs every 6 (six) hours as needed for wheezing or shortness of breath.   atorvastatin (LIPITOR) 80 MG tablet TAKE 1 TABLET BY MOUTH  DAILY   Azelastine-Fluticasone (DYMISTA) 137-50 MCG/ACT SUSP Place 1 spray into the nose in the morning and at bedtime.   Budeson-Glycopyrrol-Formoterol (BREZTRI AEROSPHERE) 160-9-4.8 MCG/ACT AERO USE 2 INHALATIONS BY MOUTH  DAILY   cholecalciferol (VITAMIN D3) 25 MCG (1000 UNIT) tablet Take 1,000 Units by mouth daily.   clopidogrel (PLAVIX) 75 MG tablet TAKE 1 TABLET BY MOUTH  DAILY    diazepam (VALIUM) 5 MG tablet Take 5 mg by mouth as needed for anxiety.   diclofenac Sodium (VOLTAREN) 1 % GEL Apply topically 4 (four) times daily.   donepezil (ARICEPT) 5 MG tablet TAKE 1 TABLET BY MOUTH  DAILY   ezetimibe (ZETIA) 10 MG tablet Take 1 tablet (10 mg total) by mouth daily.   gabapentin (NEURONTIN) 100 MG capsule TAKE 1 CAPSULE BY MOUTH 3  TIMES DAILY AS NEEDED   Ginkgo Biloba 40 MG TABS Take by mouth.   levocetirizine (XYZAL) 5 MG tablet Take 1 tablet (5 mg total) by mouth every evening.   meclizine (ANTIVERT) 25 MG tablet TAKE 1 TABLET BY MOUTH 2  TIMES EVERY DAY AS NEEDED   olmesartan-hydrochlorothiazide (BENICAR HCT) 40-12.5 MG tablet TAKE 1 TABLET BY MOUTH  DAILY   ONETOUCH ULTRA test strip USE WITH METER TO CHECK  BLOOD SUGAR BEFORE  BREAKFAST AND BEFORE DINNER   Probiotic Product (PROBIOTIC-10 PO) Take by mouth. Take one tablet daily   Semaglutide (RYBELSUS) 7 MG TABS Take 1 tablet by mouth daily.   Spacer/Aero-Holding Chambers DEVI 1 each by Does not apply route. Please dispense spacer. DX: J44.9   vitamin B-12 (CYANOCOBALAMIN) 100 MCG tablet Take 100 mcg by mouth daily.   vitamin C (ASCORBIC ACID) 500 MG tablet Take 500 mg by mouth daily.   VITAMIN D PO Take 1 tablet by mouth daily.   Facility-Administered Encounter Medications as of 07/23/2021  Medication   0.9 %  sodium chloride infusion  Recent Relevant Labs: Lab Results  Component Value Date/Time   HGBA1C 7.2 (H) 05/29/2021 02:23 PM   HGBA1C 6.8 (H) 01/16/2021 12:47 PM  MICROALBUR 30 07/26/2020 04:28 PM   MICROALBUR 10 06/30/2019 11:47 AM    Kidney Function Lab Results  Component Value Date/Time   CREATININE 1.48 (H) 05/29/2021 02:23 PM   CREATININE 1.16 (H) 01/16/2021 12:47 PM   GFRNONAA 48 (L) 10/19/2019 04:19 PM   GFRAA 56 (L) 10/19/2019 04:19 PM    Current antihyperglycemic regimen:  Rybelsus 7 mg daily  What recent interventions/DTPs have been made to improve glycemic control:  Educated on A1c  and blood sugar goals; -Counseled to check feet daily and get yearly eye exams -Recommended to continue current medication  Have there been any recent hospitalizations or ED visits since last visit with CPP? No  Patient denies hypoglycemic symptoms  Patient denies hyperglycemic symptoms  How often are you checking your blood sugar? twice daily  What are your blood sugars ranging?  Fasting: 06-26 91 Before meals: None After meals: 06-24 80 Bedtime: 06-25 92  During the week, how often does your blood glucose drop below 70? Never  Are you checking your feet daily/regularly?   Adherence Review: Is the patient currently on a STATIN medication? Yes Is the patient currently on ACE/ARB medication? Yes Does the patient have >5 day gap between last estimated fill dates? No   Care Gaps: Shingrix overdue  Star Rating Drugs: Olmesartan-HCTZ 40-12.5 mg- Last filled 04-14-2021 90 [facility] ([facility] confirmed last refill was 07-08-2021 90 DS) Rybelsus 7 mg- Last filled 05-16-2021 90 [facility] Atorvastatin 80 mg- Last filled 04-14-2021 90 [facility] ([facility] confirmed last refill was 07-08-2021 90 DS)  [place] Clinical Pharmacist Assistant [phone]

## 2021-07-25 ENCOUNTER — Telehealth: Payer: Self-pay

## 2021-07-25 ENCOUNTER — Ambulatory Visit (INDEPENDENT_AMBULATORY_CARE_PROVIDER_SITE_OTHER): Payer: Medicare Other

## 2021-07-25 DIAGNOSIS — I10 Essential (primary) hypertension: Secondary | ICD-10-CM

## 2021-07-25 DIAGNOSIS — J4489 Other specified chronic obstructive pulmonary disease: Secondary | ICD-10-CM

## 2021-07-25 DIAGNOSIS — E1159 Type 2 diabetes mellitus with other circulatory complications: Secondary | ICD-10-CM

## 2021-07-25 DIAGNOSIS — J449 Chronic obstructive pulmonary disease, unspecified: Secondary | ICD-10-CM

## 2021-07-25 DIAGNOSIS — N1831 Chronic kidney disease, stage 3a: Secondary | ICD-10-CM

## 2021-07-25 NOTE — Telephone Encounter (Signed)
Spoke to patient to notify her of Dr. Heber [place] comments as it related to her lower extremity ABI results. Patient verbalized understanding. All questions were addressed during the call.    Dr. Heber  comments: Please call and inform patient the results of her lower extremity ABIs were normal. I will see her at her next scheduled appt. Thanks!

## 2021-07-25 NOTE — Patient Instructions (Signed)
Social Worker Visit Information  Goals we discussed today:  Patient Goals/Self-Care Activities patient will:   - Engage with SW to obtain a better understanding of available resources  Materials Provided: No. Patient not reached.   Follow Up Plan:  SW will follow up with the patient over the next two weeks upon hearing back from the in home aid program re: wait list status   Daneen Schick, Arita Miss, CDP Social Worker, Certified Dementia Practitioner [facility] Care Management [phone]

## 2021-07-25 NOTE — Telephone Encounter (Signed)
-----   Message from [name], DPM sent at 07/10/2021  5:00 AM EDT ----- Please call and inform patient the results of her lower extremity ABIs were normal. I will see her at her next scheduled appt. Thanks!

## 2021-07-25 NOTE — Chronic Care Management (AMB) (Signed)
Chronic Care Management    Social Work Note  07/25/2021 Name: Donna Gilmore MRN: [id number] DOB: 1940-02-04  Donna Gilmore is a 81 y.o. year old female who is a primary care patient of [name], [place]. The CCM team was consulted to assist the patient with chronic disease management and/or care coordination needs related to:  HTN, COPD, CKD III, DM II .   Collaboration with RN Care Manager  for  case follow up  in response to provider referral for social work chronic care management and care coordination services.   Consent to Services:  The patient was given information about Chronic Care Management services, agreed to services, and gave verbal consent prior to initiation of services.  Please see initial visit note for detailed documentation.   Patient agreed to services and consent obtained.   Assessment: Review of patient past medical history, allergies, medications, and health status, including review of relevant consultants reports was performed today as part of a comprehensive evaluation and provision of chronic care management and care coordination services.     SDOH (Social Determinants of Health) assessments and interventions performed:    Advanced Directives Status: Not addressed in this encounter.  CCM Care Plan  No Known Allergies  Outpatient Encounter Medications as of 07/25/2021  Medication Sig   acetaminophen (TYLENOL) 500 MG tablet Take 1 tablet (500 mg total) by mouth every 6 (six) hours as needed.   albuterol (VENTOLIN HFA) 108 (90 Base) MCG/ACT inhaler Inhale 2 puffs into the lungs every 6 (six) hours as needed for wheezing or shortness of breath.   atorvastatin (LIPITOR) 80 MG tablet TAKE 1 TABLET BY MOUTH  DAILY   Azelastine-Fluticasone (DYMISTA) 137-50 MCG/ACT SUSP Place 1 spray into the nose in the morning and at bedtime.   Budeson-Glycopyrrol-Formoterol (BREZTRI AEROSPHERE) 160-9-4.8 MCG/ACT AERO USE 2 INHALATIONS BY MOUTH  DAILY   cholecalciferol  (VITAMIN D3) 25 MCG (1000 UNIT) tablet Take 1,000 Units by mouth daily.   clopidogrel (PLAVIX) 75 MG tablet TAKE 1 TABLET BY MOUTH  DAILY   diazepam (VALIUM) 5 MG tablet Take 5 mg by mouth as needed for anxiety.   diclofenac Sodium (VOLTAREN) 1 % GEL Apply topically 4 (four) times daily.   donepezil (ARICEPT) 5 MG tablet TAKE 1 TABLET BY MOUTH  DAILY   ezetimibe (ZETIA) 10 MG tablet Take 1 tablet (10 mg total) by mouth daily.   gabapentin (NEURONTIN) 100 MG capsule TAKE 1 CAPSULE BY MOUTH 3  TIMES DAILY AS NEEDED   Ginkgo Biloba 40 MG TABS Take by mouth.   levocetirizine (XYZAL) 5 MG tablet Take 1 tablet (5 mg total) by mouth every evening.   meclizine (ANTIVERT) 25 MG tablet TAKE 1 TABLET BY MOUTH 2  TIMES EVERY DAY AS NEEDED   olmesartan-hydrochlorothiazide (BENICAR HCT) 40-12.5 MG tablet TAKE 1 TABLET BY MOUTH  DAILY   ONETOUCH ULTRA test strip USE WITH METER TO CHECK  BLOOD SUGAR BEFORE  BREAKFAST AND BEFORE DINNER   Probiotic Product (PROBIOTIC-10 PO) Take by mouth. Take one tablet daily   Semaglutide (RYBELSUS) 7 MG TABS Take 1 tablet by mouth daily.   Spacer/Aero-Holding Chambers DEVI 1 each by Does not apply route. Please dispense spacer. DX: J44.9   vitamin B-12 (CYANOCOBALAMIN) 100 MCG tablet Take 100 mcg by mouth daily.   vitamin C (ASCORBIC ACID) 500 MG tablet Take 500 mg by mouth daily.   VITAMIN D PO Take 1 tablet by mouth daily.   Facility-Administered Encounter Medications as of  07/25/2021  Medication   0.9 %  sodium chloride infusion    Patient Active Problem List   Diagnosis Date Noted   Chronic rhinitis 09/04/2020   COPD with asthma ([place]) 02/25/2019   Peripheral vascular disease ([place]) 02/25/2019   Pain in left foot 08/21/2018   Chronic bilateral low back pain without sciatica 06/01/2018   Class 1 obesity due to excess calories without serious comorbidity with body mass index (BMI) of 32.0 to 32.9 in adult 06/01/2018   Acute left-sided low back pain with left-sided  sciatica 05/04/2018   Shortness of breath 05/04/2018   Tinnitus, bilateral 03/18/2018   Meralgia paraesthetica, left 01/12/2018   Parotid mass 01/10/2017   Sensorineural hearing loss (SNHL) of both ears 01/10/2017   Mild cognitive impairment 10/14/2016   History of stroke 10/14/2016   History of syphilis 10/14/2016   Mixed hyperlipidemia 10/14/2016   Insomnia 10/14/2016   Cerebellar stroke syndrome 10/23/2013   Smoker 02/16/2013   Chest pain 09/21/2012   Tobacco abuse disorder 09/21/2012   Benign paroxysmal positional vertigo 08/26/2012   CVA (cerebral infarction) 08/25/2012   Essential hypertension 08/25/2012   Dyslipidemia 08/25/2012    Conditions to be addressed/monitored: HTN, COPD, DMII, and CKD Stage III ; Inability to perform IADL's independently  Care Plan : Social Work Plan of Care  Updates made by Daneen Schick since 07/25/2021 12:00 AM     Problem: Quality of Life (General Plan of Care)      Goal: Quality of Life Maintained - Obtain in home assistance   Start Date: 07/25/2021  Priority: High  Note:   Current Barriers:  Chronic disease management support and education needs related to HTN, COPD, DM, and CKD Stage III   Inability to perform IADL's independently  Social Worker Clinical Goal(s):  patient will work with SW to identify and address any acute and/or chronic care coordination needs related to the self health management of HTN, COPD, DM, and CKD Stage III   Patient will work with SW to develop a better understanding of resources to assist with light housekeeping SW Interventions:  Inter-disciplinary care team collaboration (see longitudinal plan of care) Collaboration with [name], [place] regarding development and update of comprehensive plan of care as evidenced by provider attestation and co-signature Communication received from [place] indicating the patient is interested in resources to assist with light housekeeping Performed  chart review to note this writer placed the patient on the in home aid wait list Jun 23 2020 Collaboration with In [place] requesting feedback on patients wait list position - awaiting response Unsuccessful outbound call placed to the patient, voice message left requesting a return call  Patient Goals/Self-Care Activities patient will:   -  Engage with SW to obtain a better understanding of available resources  Follow Up Plan:  SW will follow up with the patient upon determining wait list status       Follow Up Plan: SW will follow up with patient by phone over the next two weeks.      Daneen Schick, BSW, CDP Social Worker, Certified Dementia Practitioner [place] Management [phone]

## 2021-07-27 DIAGNOSIS — I10 Essential (primary) hypertension: Secondary | ICD-10-CM

## 2021-07-27 DIAGNOSIS — J449 Chronic obstructive pulmonary disease, unspecified: Secondary | ICD-10-CM

## 2021-07-27 DIAGNOSIS — E1159 Type 2 diabetes mellitus with other circulatory complications: Secondary | ICD-10-CM

## 2021-07-27 DIAGNOSIS — N1831 Chronic kidney disease, stage 3a: Secondary | ICD-10-CM

## 2021-07-30 ENCOUNTER — Ambulatory Visit: Payer: Self-pay

## 2021-07-30 DIAGNOSIS — N1831 Chronic kidney disease, stage 3a: Secondary | ICD-10-CM

## 2021-07-30 DIAGNOSIS — I10 Essential (primary) hypertension: Secondary | ICD-10-CM

## 2021-07-30 DIAGNOSIS — J449 Chronic obstructive pulmonary disease, unspecified: Secondary | ICD-10-CM

## 2021-07-30 DIAGNOSIS — E1159 Type 2 diabetes mellitus with other circulatory complications: Secondary | ICD-10-CM

## 2021-07-30 NOTE — Patient Instructions (Addendum)
Social Worker Visit Information  Goals we discussed today:  Patient Goals/Self-Care Activities patient will:   - Engage with SW to obtain a better understanding of available resources   Materials Provided: No. Patient not reached.  Follow Up Plan:  SW will attempt a third outreach to the patient over the next 10 days.  Daneen Schick, BSW, CDP Social Worker, Certified Dementia Practitioner [place] Management [phone]

## 2021-07-30 NOTE — Chronic Care Management (AMB) (Signed)
Chronic Care Management    Social Work Note  07/30/2021 Name: Donna Gilmore MRN: [id number] DOB: 09/10/1940  Donna Gilmore is a 81 y.o. year old female who is a primary care patient of [name], [place]. The CCM team was consulted to assist the patient with chronic disease management and/or care coordination needs related to:  HTN, DM II, COPD, CKD III .   Collaboration with [place]  for  case collaboration  in response to provider referral for social work chronic care management and care coordination services.   Consent to Services:  The patient was given information about Chronic Care Management services, agreed to services, and gave verbal consent prior to initiation of services.  Please see initial visit note for detailed documentation.   Patient agreed to services and consent obtained.   Assessment: Review of patient past medical history, allergies, medications, and health status, including review of relevant consultants reports was performed today as part of a comprehensive evaluation and provision of chronic care management and care coordination services.     SDOH (Social Determinants of Health) assessments and interventions performed:    Advanced Directives Status: Not addressed in this encounter.  CCM Care Plan  No Known Allergies  Outpatient Encounter Medications as of 07/30/2021  Medication Sig   acetaminophen (TYLENOL) 500 MG tablet Take 1 tablet (500 mg total) by mouth every 6 (six) hours as needed.   albuterol (VENTOLIN HFA) 108 (90 Base) MCG/ACT inhaler Inhale 2 puffs into the lungs every 6 (six) hours as needed for wheezing or shortness of breath.   atorvastatin (LIPITOR) 80 MG tablet TAKE 1 TABLET BY MOUTH  DAILY   Azelastine-Fluticasone (DYMISTA) 137-50 MCG/ACT SUSP Place 1 spray into the nose in the morning and at bedtime.   Budeson-Glycopyrrol-Formoterol (BREZTRI AEROSPHERE) 160-9-4.8 MCG/ACT AERO USE 2 INHALATIONS BY MOUTH  DAILY    cholecalciferol (VITAMIN D3) 25 MCG (1000 UNIT) tablet Take 1,000 Units by mouth daily.   clopidogrel (PLAVIX) 75 MG tablet TAKE 1 TABLET BY MOUTH  DAILY   diazepam (VALIUM) 5 MG tablet Take 5 mg by mouth as needed for anxiety.   diclofenac Sodium (VOLTAREN) 1 % GEL Apply topically 4 (four) times daily.   donepezil (ARICEPT) 5 MG tablet TAKE 1 TABLET BY MOUTH  DAILY   ezetimibe (ZETIA) 10 MG tablet Take 1 tablet (10 mg total) by mouth daily.   gabapentin (NEURONTIN) 100 MG capsule TAKE 1 CAPSULE BY MOUTH 3  TIMES DAILY AS NEEDED   Ginkgo Biloba 40 MG TABS Take by mouth.   levocetirizine (XYZAL) 5 MG tablet Take 1 tablet (5 mg total) by mouth every evening.   meclizine (ANTIVERT) 25 MG tablet TAKE 1 TABLET BY MOUTH 2  TIMES EVERY DAY AS NEEDED   olmesartan-hydrochlorothiazide (BENICAR HCT) 40-12.5 MG tablet TAKE 1 TABLET BY MOUTH  DAILY   ONETOUCH ULTRA test strip USE WITH METER TO CHECK  BLOOD SUGAR BEFORE  BREAKFAST AND BEFORE DINNER   Probiotic Product (PROBIOTIC-10 PO) Take by mouth. Take one tablet daily   Semaglutide (RYBELSUS) 7 MG TABS Take 1 tablet by mouth daily.   Spacer/Aero-Holding Chambers DEVI 1 each by Does not apply route. Please dispense spacer. DX: J44.9   vitamin B-12 (CYANOCOBALAMIN) 100 MCG tablet Take 100 mcg by mouth daily.   vitamin C (ASCORBIC ACID) 500 MG tablet Take 500 mg by mouth daily.   VITAMIN D PO Take 1 tablet by mouth daily.   Facility-Administered Encounter Medications  as of 07/30/2021  Medication   0.9 %  sodium chloride infusion    Patient Active Problem List   Diagnosis Date Noted   Chronic rhinitis 09/04/2020   COPD with asthma ([place]) 02/25/2019   Peripheral vascular disease ([place]) 02/25/2019   Pain in left foot 08/21/2018   Chronic bilateral low back pain without sciatica 06/01/2018   Class 1 obesity due to excess calories without serious comorbidity with body mass index (BMI) of 32.0 to 32.9 in adult 06/01/2018   Acute left-sided low back pain  with left-sided sciatica 05/04/2018   Shortness of breath 05/04/2018   Tinnitus, bilateral 03/18/2018   Meralgia paraesthetica, left 01/12/2018   Parotid mass 01/10/2017   Sensorineural hearing loss (SNHL) of both ears 01/10/2017   Mild cognitive impairment 10/14/2016   History of stroke 10/14/2016   History of syphilis 10/14/2016   Mixed hyperlipidemia 10/14/2016   Insomnia 10/14/2016   Cerebellar stroke syndrome 10/23/2013   Smoker 02/16/2013   Chest pain 09/21/2012   Tobacco abuse disorder 09/21/2012   Benign paroxysmal positional vertigo 08/26/2012   CVA (cerebral infarction) 08/25/2012   Essential hypertension 08/25/2012   Dyslipidemia 08/25/2012    Conditions to be addressed/monitored: HTN, COPD, DMII, and CKD Stage III ;  Difficulty performing household duties  Care Plan : Social Work Plan of Care  Updates made by Daneen Schick since 07/30/2021 12:00 AM     Problem: Quality of Life (General Plan of Care)      Goal: Quality of Life Maintained - Obtain in home assistance   Start Date: 07/25/2021  Priority: High  Note:   Current Barriers:  Chronic disease management support and education needs related to HTN, COPD, DM, and CKD Stage III   Inability to perform IADL's independently  Social Worker Clinical Goal(s):  patient will work with SW to identify and address any acute and/or chronic care coordination needs related to the self health management of HTN, COPD, DM, and CKD Stage III   Patient will work with SW to develop a better understanding of resources to assist with light housekeeping SW Interventions:  Inter-disciplinary care team collaboration (see longitudinal plan of care) Collaboration with [name], [place] regarding development and update of comprehensive plan of care as evidenced by provider attestation and co-signature Communication received from [employer] with the in home aid program. She reports the patient is number 28 on the wait list Second  unsuccessful outbound call placed to the patient to assist with care coordination needs related to in home assistance as requested by [place] - voice message left requesting a return call  Patient Goals/Self-Care Activities patient will:   -  Engage with SW to obtain a better understanding of available resources  Follow Up Plan:  SW will attempt a third outreach to the patient over the next 10 days prior to closing       Follow Up Plan:  SW will attempt to contact the patient over the next 10 days.      Daneen Schick, BSW, CDP Social Worker, Certified Dementia Practitioner [place] [facility] [phone]

## 2021-08-01 DIAGNOSIS — H0102A Squamous blepharitis right eye, upper and lower eyelids: Secondary | ICD-10-CM | POA: Diagnosis not present

## 2021-08-01 DIAGNOSIS — H0288A Meibomian gland dysfunction right eye, upper and lower eyelids: Secondary | ICD-10-CM | POA: Diagnosis not present

## 2021-08-01 DIAGNOSIS — H0102B Squamous blepharitis left eye, upper and lower eyelids: Secondary | ICD-10-CM | POA: Diagnosis not present

## 2021-08-01 DIAGNOSIS — H0288B Meibomian gland dysfunction left eye, upper and lower eyelids: Secondary | ICD-10-CM | POA: Diagnosis not present

## 2021-08-01 DIAGNOSIS — H04123 Dry eye syndrome of bilateral lacrimal glands: Secondary | ICD-10-CM | POA: Diagnosis not present

## 2021-08-07 ENCOUNTER — Ambulatory Visit (INDEPENDENT_AMBULATORY_CARE_PROVIDER_SITE_OTHER): Payer: Medicare Other

## 2021-08-07 DIAGNOSIS — J449 Chronic obstructive pulmonary disease, unspecified: Secondary | ICD-10-CM

## 2021-08-07 DIAGNOSIS — E1169 Type 2 diabetes mellitus with other specified complication: Secondary | ICD-10-CM

## 2021-08-07 DIAGNOSIS — N1831 Chronic kidney disease, stage 3a: Secondary | ICD-10-CM

## 2021-08-07 DIAGNOSIS — I10 Essential (primary) hypertension: Secondary | ICD-10-CM

## 2021-08-07 NOTE — Patient Instructions (Signed)
Social Worker Visit Information  Goals we discussed today:  Patient Goals/Self-Care Activities patient will:   - Engage with [facility] in home aid program regarding caregiver assistance when necessary  Materials Provided: Verbal education about community resource provided by phone  Patient verbalizes understanding of instructions and care plan provided today and agrees to view in [place]. Active MyChart status and patient understanding of how to access instructions and care plan via MyChart confirmed with patient.     Follow Up Plan:  No SW follow up planned at this time. Please contact your primary care provider as needed.   Daneen Schick, BSW, CDP Social Worker, Certified Dementia Practitioner [place] Management [phone]

## 2021-08-07 NOTE — Chronic Care Management (AMB) (Signed)
Chronic Care Management    Social Work Note  08/07/2021 Name: Donna Gilmore MRN: [id number] DOB: 08/26/1940  Donna Gilmore is a 81 y.o. year old female who is a primary care patient of [name], [place]. The CCM team was consulted to assist the patient with chronic disease management and/or care coordination needs related to:  HTN, DM II, COPD, CKD III .   Engaged with patient by telephone for follow up visit in response to provider referral for social work chronic care management and care coordination services.   Consent to Services:  The patient was given information about Chronic Care Management services, agreed to services, and gave verbal consent prior to initiation of services.  Please see initial visit note for detailed documentation.   Patient agreed to services and consent obtained.   Assessment: Review of patient past medical history, allergies, medications, and health status, including review of relevant consultants reports was performed today as part of a comprehensive evaluation and provision of chronic care management and care coordination services.     SDOH (Social Determinants of Health) assessments and interventions performed:    Advanced Directives Status: Not addressed in this encounter.  CCM Care Plan  No Known Allergies  Outpatient Encounter Medications as of 08/07/2021  Medication Sig   acetaminophen (TYLENOL) 500 MG tablet Take 1 tablet (500 mg total) by mouth every 6 (six) hours as needed.   albuterol (VENTOLIN HFA) 108 (90 Base) MCG/ACT inhaler Inhale 2 puffs into the lungs every 6 (six) hours as needed for wheezing or shortness of breath.   atorvastatin (LIPITOR) 80 MG tablet TAKE 1 TABLET BY MOUTH  DAILY   Azelastine-Fluticasone (DYMISTA) 137-50 MCG/ACT SUSP Place 1 spray into the nose in the morning and at bedtime.   Budeson-Glycopyrrol-Formoterol (BREZTRI AEROSPHERE) 160-9-4.8 MCG/ACT AERO USE 2 INHALATIONS BY MOUTH  DAILY   cholecalciferol (VITAMIN  D3) 25 MCG (1000 UNIT) tablet Take 1,000 Units by mouth daily.   clopidogrel (PLAVIX) 75 MG tablet TAKE 1 TABLET BY MOUTH  DAILY   diazepam (VALIUM) 5 MG tablet Take 5 mg by mouth as needed for anxiety.   diclofenac Sodium (VOLTAREN) 1 % GEL Apply topically 4 (four) times daily.   donepezil (ARICEPT) 5 MG tablet TAKE 1 TABLET BY MOUTH  DAILY   ezetimibe (ZETIA) 10 MG tablet Take 1 tablet (10 mg total) by mouth daily.   gabapentin (NEURONTIN) 100 MG capsule TAKE 1 CAPSULE BY MOUTH 3  TIMES DAILY AS NEEDED   Ginkgo Biloba 40 MG TABS Take by mouth.   levocetirizine (XYZAL) 5 MG tablet Take 1 tablet (5 mg total) by mouth every evening.   meclizine (ANTIVERT) 25 MG tablet TAKE 1 TABLET BY MOUTH 2  TIMES EVERY DAY AS NEEDED   olmesartan-hydrochlorothiazide (BENICAR HCT) 40-12.5 MG tablet TAKE 1 TABLET BY MOUTH  DAILY   ONETOUCH ULTRA test strip USE WITH METER TO CHECK  BLOOD SUGAR BEFORE  BREAKFAST AND BEFORE DINNER   Probiotic Product (PROBIOTIC-10 PO) Take by mouth. Take one tablet daily   Semaglutide (RYBELSUS) 7 MG TABS Take 1 tablet by mouth daily.   Spacer/Aero-Holding Chambers DEVI 1 each by Does not apply route. Please dispense spacer. DX: J44.9   vitamin B-12 (CYANOCOBALAMIN) 100 MCG tablet Take 100 mcg by mouth daily.   vitamin C (ASCORBIC ACID) 500 MG tablet Take 500 mg by mouth daily.   VITAMIN D PO Take 1 tablet by mouth daily.   Facility-Administered Encounter Medications as of 08/07/2021  Medication  0.9 %  sodium chloride infusion    Patient Active Problem List   Diagnosis Date Noted   Chronic rhinitis 09/04/2020   COPD with asthma ([place]) 02/25/2019   Peripheral vascular disease ([place]) 02/25/2019   Pain in left foot 08/21/2018   Chronic bilateral low back pain without sciatica 06/01/2018   Class 1 obesity due to excess calories without serious comorbidity with body mass index (BMI) of 32.0 to 32.9 in adult 06/01/2018   Acute left-sided low back pain with left-sided sciatica  05/04/2018   Shortness of breath 05/04/2018   Tinnitus, bilateral 03/18/2018   Meralgia paraesthetica, left 01/12/2018   Parotid mass 01/10/2017   Sensorineural hearing loss (SNHL) of both ears 01/10/2017   Mild cognitive impairment 10/14/2016   History of stroke 10/14/2016   History of syphilis 10/14/2016   Mixed hyperlipidemia 10/14/2016   Insomnia 10/14/2016   Cerebellar stroke syndrome 10/23/2013   Smoker 02/16/2013   Chest pain 09/21/2012   Tobacco abuse disorder 09/21/2012   Benign paroxysmal positional vertigo 08/26/2012   CVA (cerebral infarction) 08/25/2012   Essential hypertension 08/25/2012   Dyslipidemia 08/25/2012    Conditions to be addressed/monitored: HTN, COPD, DMII, and CKD Stage III ;  Difficulty performing iADL's  Care Plan : Social Work Plan of Care  Updates made by Daneen Schick since 08/07/2021 12:00 AM  Completed 08/07/2021   Problem: Quality of Life (General Plan of Care) Resolved 08/07/2021     Goal: Quality of Life Maintained - Obtain in home assistance Completed 08/07/2021  Start Date: 07/25/2021  This Visit's Progress: On track  Priority: High  Note:   Current Barriers:  Chronic disease management support and education needs related to HTN, COPD, DM, and CKD Stage III   Inability to perform IADL's independently  Social Worker Clinical Goal(s):  patient will work with SW to identify and address any acute and/or chronic care coordination needs related to the self health management of HTN, COPD, DM, and CKD Stage III   Patient will work with SW to develop a better understanding of resources to assist with light housekeeping SW Interventions:  Inter-disciplinary care team collaboration (see longitudinal plan of care) Collaboration with [name], [place] regarding development and update of comprehensive plan of care as evidenced by provider attestation and co-signature Telephonic visit completed with the patient to discuss desire for patient to  receive assistance with housekeeping Reviewed patient remains on the wait list for the [facility] in home aid program Advised the patient she is currently #28 on the wait list; patient is ware this program will contact her directly once she is able to receive assistance Discussed other opportunity for assistance now would involve patient privately hiring assistance Determined patient will review finances and hire someone if she is able Goal closed  Patient Goals/Self-Care Activities patient will:   -  Engage with [facility] in home aid program regarding caregiver assistance when necessary        Follow Up Plan:  No SW follow up planned at this time. The patient will remain engaged with RN Care Manager to address care management needs.      Daneen Schick, BSW, CDP Social Worker, Certified Dementia Practitioner [place] [facility] [phone]

## 2021-08-14 DIAGNOSIS — H0102A Squamous blepharitis right eye, upper and lower eyelids: Secondary | ICD-10-CM | POA: Diagnosis not present

## 2021-08-14 DIAGNOSIS — H04123 Dry eye syndrome of bilateral lacrimal glands: Secondary | ICD-10-CM | POA: Diagnosis not present

## 2021-08-14 DIAGNOSIS — H0288A Meibomian gland dysfunction right eye, upper and lower eyelids: Secondary | ICD-10-CM | POA: Diagnosis not present

## 2021-08-14 DIAGNOSIS — H0102B Squamous blepharitis left eye, upper and lower eyelids: Secondary | ICD-10-CM | POA: Diagnosis not present

## 2021-08-14 DIAGNOSIS — H0288B Meibomian gland dysfunction left eye, upper and lower eyelids: Secondary | ICD-10-CM | POA: Diagnosis not present

## 2021-08-23 ENCOUNTER — Other Ambulatory Visit: Payer: Self-pay | Admitting: Nurse Practitioner

## 2021-08-23 DIAGNOSIS — G3184 Mild cognitive impairment, so stated: Secondary | ICD-10-CM

## 2021-08-27 ENCOUNTER — Encounter: Payer: Self-pay | Admitting: Nurse Practitioner

## 2021-08-27 DIAGNOSIS — I10 Essential (primary) hypertension: Secondary | ICD-10-CM

## 2021-08-27 DIAGNOSIS — J449 Chronic obstructive pulmonary disease, unspecified: Secondary | ICD-10-CM

## 2021-08-27 DIAGNOSIS — E1169 Type 2 diabetes mellitus with other specified complication: Secondary | ICD-10-CM

## 2021-08-27 DIAGNOSIS — N1831 Chronic kidney disease, stage 3a: Secondary | ICD-10-CM

## 2021-08-27 DIAGNOSIS — E1159 Type 2 diabetes mellitus with other circulatory complications: Secondary | ICD-10-CM | POA: Diagnosis not present

## 2021-09-01 ENCOUNTER — Other Ambulatory Visit: Payer: Self-pay | Admitting: Nurse Practitioner

## 2021-09-04 ENCOUNTER — Ambulatory Visit (INDEPENDENT_AMBULATORY_CARE_PROVIDER_SITE_OTHER): Payer: Medicare Other | Admitting: Nurse Practitioner

## 2021-09-04 ENCOUNTER — Encounter: Payer: Self-pay | Admitting: Nurse Practitioner

## 2021-09-04 VITALS — BP 124/80 | HR 64 | Temp 98.1°F | Ht 65.0 in | Wt 179.4 lb

## 2021-09-04 DIAGNOSIS — H6123 Impacted cerumen, bilateral: Secondary | ICD-10-CM | POA: Diagnosis not present

## 2021-09-04 NOTE — Progress Notes (Signed)
I,[name],acting as a Education administrator for [name], FNP.,have documented all relevant documentation on the behalf of [name], FNP,as directed by  [name], FNP while in the presence of [name], [place].   Subjective:     Patient ID: Donna Gilmore , female    DOB: 02/27/1940 , 81 y.o.   MRN: [id number]   Chief Complaint  Patient presents with   Ear Fullness    HPI  Pt presents today for ear cleaning. She would like her left ear checked. Her insurance company sent her to get a hearing test done & they advised she comes to PCP for left ear to be cleaned. Patient does not remember name of facility.  She is supposed to get better hearing aids.     Past Medical History:  Diagnosis Date   Benign paroxysmal positional vertigo 08/26/2012   CVA (cerebral infarction) 08/25/2012   Diabetes mellitus without complication ([place])    Dyslipidemia 08/25/2012   Essential hypertension, benign 08/25/2012   Hyperlipidemia    Hypertension    Meralgia paraesthetica, left 01/12/2018   Stroke [facility])      Family History  Problem Relation Age of Onset   Kidney failure Mother    Diabetes Mother    Alzheimer's disease Mother    Alzheimer's disease Father    Prostate cancer Father    Diabetes Maternal Grandmother    Diabetes Maternal Grandfather    Alzheimer's disease Paternal Grandmother    Alzheimer's disease Paternal Grandfather    Prostate cancer Paternal Uncle      Current Outpatient Medications:    acetaminophen (TYLENOL) 500 MG tablet, Take 1 tablet (500 mg total) by mouth every 6 (six) hours as needed., Disp: 30 tablet, Rfl: 0   albuterol (VENTOLIN HFA) 108 (90 Base) MCG/ACT inhaler, Inhale 2 puffs into the lungs every 6 (six) hours as needed for wheezing or shortness of breath., Disp: 8 g, Rfl: 6   atorvastatin (LIPITOR) 80 MG tablet, TAKE 1 TABLET BY MOUTH ONCE DAILY, Disp: 90 tablet, Rfl: 3   Azelastine-Fluticasone (DYMISTA) 137-50 MCG/ACT SUSP, Place 1 spray into the  nose in the morning and at bedtime., Disp: 23 g, Rfl: 5   Budeson-Glycopyrrol-Formoterol (BREZTRI AEROSPHERE) 160-9-4.8 MCG/ACT AERO, USE 2 INHALATIONS BY MOUTH  DAILY, Disp: 32.1 g, Rfl: 2   cholecalciferol (VITAMIN D3) 25 MCG (1000 UNIT) tablet, Take 1,000 Units by mouth daily., Disp: , Rfl:    clopidogrel (PLAVIX) 75 MG tablet, TAKE 1 TABLET BY MOUTH  DAILY, Disp: 90 tablet, Rfl: 3   diazepam (VALIUM) 5 MG tablet, Take 5 mg by mouth as needed for anxiety., Disp: , Rfl:    diclofenac Sodium (VOLTAREN) 1 % GEL, Apply topically 4 (four) times daily., Disp: , Rfl:    donepezil (ARICEPT) 5 MG tablet, TAKE 1 TABLET BY MOUTH  DAILY, Disp: 90 tablet, Rfl: 3   gabapentin (NEURONTIN) 100 MG capsule, TAKE 1 CAPSULE BY MOUTH 3  TIMES DAILY AS NEEDED, Disp: 270 capsule, Rfl: 3   Ginkgo Biloba 40 MG TABS, Take by mouth., Disp: , Rfl:    levocetirizine (XYZAL) 5 MG tablet, Take 1 tablet (5 mg total) by mouth every evening., Disp: 90 tablet, Rfl: 1   meclizine (ANTIVERT) 25 MG tablet, TAKE 1 TABLET BY MOUTH 2  TIMES EVERY DAY AS NEEDED, Disp: 180 tablet, Rfl: 1   olmesartan-hydrochlorothiazide (BENICAR HCT) 40-12.5 MG tablet, TAKE 1 TABLET BY MOUTH  DAILY, Disp: 90 tablet, Rfl: 3   ONETOUCH ULTRA  test strip, USE WITH METER TO CHECK  BLOOD SUGAR BEFORE  BREAKFAST AND BEFORE DINNER, Disp: 200 strip, Rfl: 3   Probiotic Product (PROBIOTIC-10 PO), Take by mouth. Take one tablet daily, Disp: , Rfl:    Semaglutide (RYBELSUS) 7 MG TABS, Take 1 tablet by mouth daily., Disp: 90 tablet, Rfl: 1   Spacer/Aero-Holding Chambers DEVI, 1 each by Does not apply route. Please dispense spacer. DX: J44.9, Disp: , Rfl:    vitamin B-12 (CYANOCOBALAMIN) 100 MCG tablet, Take 100 mcg by mouth daily., Disp: , Rfl:    vitamin C (ASCORBIC ACID) 500 MG tablet, Take 500 mg by mouth daily., Disp: , Rfl:    VITAMIN D PO, Take 1 tablet by mouth daily., Disp: , Rfl:    ezetimibe (ZETIA) 10 MG tablet, Take 1 tablet (10 mg total) by mouth daily.,  Disp: 90 tablet, Rfl: 3  Current Facility-Administered Medications:    0.9 %  sodium chloride infusion, 500 mL, Intravenous, Once, [name], MD   No Known Allergies   Review of Systems  Constitutional: Negative.   HENT:  Positive for hearing loss (wears hearing aids).   Respiratory: Negative.    Cardiovascular: Negative.   Neurological: Negative.   Psychiatric/Behavioral: Negative.       Today's Vitals   09/04/21 0940  BP: 124/80  Pulse: 64  Temp: 98.1 F (36.7 C)  SpO2: 98%  Weight: 179 lb 6.4 oz (81.4 kg)  Height: '5\' 5"'$  (1.651 m)  PainSc: 0-No pain   Body mass index is 29.85 kg/m.  Wt Readings from Last 3 Encounters:  09/04/21 179 lb 6.4 oz (81.4 kg)  06/12/21 183 lb 9.6 oz (83.3 kg)  05/29/21 184 lb (83.5 kg)    Objective:  Physical Exam Vitals reviewed.  Constitutional:      General: She is not in acute distress.    Appearance: Normal appearance.  HENT:     Right Ear: Tympanic membrane, ear canal and external ear normal. There is no impacted cerumen.     Left Ear: External ear normal. There is impacted cerumen.     Ears:     Comments: Hard of hearing Cardiovascular:     Rate and Rhythm: Normal rate and regular rhythm.     Pulses: Normal pulses.     Heart sounds: Normal heart sounds. No murmur heard. Pulmonary:     Effort: Pulmonary effort is normal. No respiratory distress.     Breath sounds: Normal breath sounds. No wheezing.  Skin:    General: Skin is warm and dry.     Capillary Refill: Capillary refill takes less than 2 seconds.  Neurological:     General: No focal deficit present.     Mental Status: She is alert and oriented to person, place, and time.     Cranial Nerves: No cranial nerve deficit.     Motor: No weakness.  Psychiatric:        Mood and Affect: Mood normal.        Behavior: Behavior normal.        Thought Content: Thought content normal.        Judgment: Judgment normal.         Assessment And Plan:     1. Bilateral  impacted cerumen Comments: Left is worse than right, will flush with water lavage to both ears.     Patient was given opportunity to ask questions. Patient verbalized understanding of the plan and was able to repeat key elements of  the plan. All questions were answered to their satisfaction.  [name], FNP   I, [name], FNP, have reviewed all documentation for this visit. The documentation on 09/04/21 for the exam, diagnosis, procedures, and orders are all accurate and complete.   IF YOU HAVE BEEN REFERRED TO A SPECIALIST, IT MAY TAKE 1-2 WEEKS TO SCHEDULE/PROCESS THE REFERRAL. IF YOU HAVE NOT HEARD FROM US/SPECIALIST IN TWO WEEKS, PLEASE GIVE [place] A CALL AT [phone].   THE PATIENT IS ENCOURAGED TO PRACTICE SOCIAL DISTANCING DUE TO THE COVID-19 PANDEMIC.

## 2021-09-07 ENCOUNTER — Ambulatory Visit (INDEPENDENT_AMBULATORY_CARE_PROVIDER_SITE_OTHER): Payer: Medicare Other | Admitting: Internal Medicine

## 2021-09-07 ENCOUNTER — Encounter (HOSPITAL_BASED_OUTPATIENT_CLINIC_OR_DEPARTMENT_OTHER): Payer: Self-pay | Admitting: Internal Medicine

## 2021-09-07 VITALS — BP 124/72 | HR 56 | Ht 65.0 in | Wt 183.1 lb

## 2021-09-07 DIAGNOSIS — E785 Hyperlipidemia, unspecified: Secondary | ICD-10-CM

## 2021-09-07 DIAGNOSIS — Z8673 Personal history of transient ischemic attack (TIA), and cerebral infarction without residual deficits: Secondary | ICD-10-CM

## 2021-09-07 DIAGNOSIS — E1142 Type 2 diabetes mellitus with diabetic polyneuropathy: Secondary | ICD-10-CM

## 2021-09-07 MED ORDER — EZETIMIBE 10 MG PO TABS: 10.0000 mg | ORAL_TABLET | Freq: Every day | ORAL | 3 refills | Status: DC

## 2021-09-07 NOTE — Patient Instructions (Signed)

## 2021-09-07 NOTE — Progress Notes (Unsigned)
LIPID CLINIC CONSULT NOTE  Chief Complaint:  Manage dyslipidemia  Primary Care Physician: [name], FNP  Primary Cardiologist:  None  HPI:  Donna Gilmore is a 81 y.o. female who is being seen today for the evaluation of dyslipidemia at the request of [name], [place].  This is a pleasant 80 year old female kindly referred for evaluation management of dyslipidemia.  Unfortunately she has type 2 diabetes, hypertension and dyslipidemia and has had a prior stroke.  She is referred for evaluation management of her cholesterol.  She has been compliant with atorvastatin 80 mg daily, but despite this her labs in December 2022 showed total cholesterol 232, triglycerides 379, HDL 50 and LDL 116.  Target LDL is less than 70 or possibly 55 given very high risk.  She will certainly need additional therapies.  We discussed diet at length today 2.  She does eat a lot of sauted vegetables such as collard greens and other meats that may be fried including fried fish at least once a week and ice cream on a regular basis.  All of these may be contributing to high triglycerides and elevated cholesterol.  PMHx:  Past Medical History:  Diagnosis Date   Benign paroxysmal positional vertigo 08/26/2012   CVA (cerebral infarction) 08/25/2012   Diabetes mellitus without complication ([place])    Dyslipidemia 08/25/2012   Essential hypertension, benign 08/25/2012   Hyperlipidemia    Hypertension    Meralgia paraesthetica, left 01/12/2018   Stroke ([place])     No past surgical history on file.  FAMHx:  Family History  Problem Relation Age of Onset   Kidney failure Mother    Diabetes Mother    Alzheimer's disease Mother    Alzheimer's disease Father    Prostate cancer Father    Diabetes Maternal Grandmother    Diabetes Maternal Grandfather    Alzheimer's disease Paternal Grandmother    Alzheimer's disease Paternal Grandfather    Prostate cancer Paternal Uncle     SOCHx:   reports that she quit  smoking about 4 years ago. Her smoking use included cigarettes. She has a 9.00 pack-year smoking history. She has never used smokeless tobacco. She reports that she does not currently use alcohol. She reports that she does not use drugs.  ALLERGIES:  No Known Allergies  ROS: Pertinent items noted in HPI and remainder of comprehensive ROS otherwise negative.  HOME MEDS: Current Outpatient Medications on File Prior to Visit  Medication Sig Dispense Refill   acetaminophen (TYLENOL) 500 MG tablet Take 1 tablet (500 mg total) by mouth every 6 (six) hours as needed. 30 tablet 0   albuterol (VENTOLIN HFA) 108 (90 Base) MCG/ACT inhaler Inhale 2 puffs into the lungs every 6 (six) hours as needed for wheezing or shortness of breath. 8 g 6   atorvastatin (LIPITOR) 80 MG tablet TAKE 1 TABLET BY MOUTH ONCE DAILY 90 tablet 3   Azelastine-Fluticasone (DYMISTA) 137-50 MCG/ACT SUSP Place 1 spray into the nose in the morning and at bedtime. 23 g 5   Budeson-Glycopyrrol-Formoterol (BREZTRI AEROSPHERE) 160-9-4.8 MCG/ACT AERO USE 2 INHALATIONS BY MOUTH  DAILY 32.1 g 2   cholecalciferol (VITAMIN D3) 25 MCG (1000 UNIT) tablet Take 1,000 Units by mouth daily.     clopidogrel (PLAVIX) 75 MG tablet TAKE 1 TABLET BY MOUTH  DAILY 90 tablet 3   diazepam (VALIUM) 5 MG tablet Take 5 mg by mouth as needed for anxiety.     diclofenac Sodium (VOLTAREN) 1 % GEL Apply  topically 4 (four) times daily.     donepezil (ARICEPT) 5 MG tablet TAKE 1 TABLET BY MOUTH  DAILY 90 tablet 3   ezetimibe (ZETIA) 10 MG tablet Take 1 tablet (10 mg total) by mouth daily. 90 tablet 3   gabapentin (NEURONTIN) 100 MG capsule TAKE 1 CAPSULE BY MOUTH 3  TIMES DAILY AS NEEDED 270 capsule 3   levocetirizine (XYZAL) 5 MG tablet Take 1 tablet (5 mg total) by mouth every evening. 90 tablet 1   meclizine (ANTIVERT) 25 MG tablet TAKE 1 TABLET BY MOUTH 2  TIMES EVERY DAY AS NEEDED 180 tablet 1   olmesartan-hydrochlorothiazide (BENICAR HCT) 40-12.5 MG tablet  TAKE 1 TABLET BY MOUTH  DAILY 90 tablet 3   ONETOUCH ULTRA test strip USE WITH METER TO CHECK  BLOOD SUGAR BEFORE  BREAKFAST AND BEFORE DINNER 200 strip 3   Probiotic Product (PROBIOTIC-10 PO) Take by mouth. Take one tablet daily     Semaglutide (RYBELSUS) 7 MG TABS Take 1 tablet by mouth daily. 90 tablet 1   vitamin C (ASCORBIC ACID) 500 MG tablet Take 500 mg by mouth daily.     VITAMIN D PO Take 1 tablet by mouth daily.     Current Facility-Administered Medications on File Prior to Visit  Medication Dose Route Frequency Provider Last Rate Last Admin   0.9 %  sodium chloride infusion  500 mL Intravenous Once [name], MD        LABS/IMAGING: No results found for this or any previous visit (from the past 48 hour(s)). No results found.  LIPID PANEL:    Component Value Date/Time   CHOL 142 05/29/2021 1421   TRIG 209 (H) 05/29/2021 1421   HDL 56 05/29/2021 1421   CHOLHDL 2.5 05/29/2021 1421   CHOLHDL 8.7 08/26/2012 0538   VLDL 60 (H) 08/26/2012 0538   LDLCALC 53 05/29/2021 1421    WEIGHTS: Wt Readings from Last 3 Encounters:  09/07/21 183 lb 1.6 oz (83.1 kg)  09/04/21 179 lb 6.4 oz (81.4 kg)  06/12/21 183 lb 9.6 oz (83.3 kg)    VITALS: BP 124/72   Pulse (!) 56   Ht '5\' 5"'$  (1.651 m)   Wt 183 lb 1.6 oz (83.1 kg)   SpO2 99%   BMI 30.47 kg/m   EXAM: Deferred  EKG: Deferred  ASSESSMENT: Mixed dyslipidemia, goal LDL less than 55 History of stroke Type 2 diabetes Hypertension  PLAN: 1.   Donna Gilmore still has uncontrolled dyslipidemia.  There is likely dietary component to this, however despite being on high potency atorvastatin she is not at target.  Would advise adding ezetimibe 10 mg daily to her regimen which hopefully will give an additional 20 to 25% reduction in her lipids.  We will plan to repeat lipid profile in about 3 to 4 months.  She may need additional therapies.  We have discussed ways that we can modify her diet as well to try to help with her  cholesterol.  Thanks again for the kind referral.  Donna Casino, MD, FACC, [place] Director of the [facility]  Card[facility]ovascular Risk Reduction [facility] Diplomate of the American Board of Clinical Lipidology Attending Cardiologist  Direct Dial: [phone]  Fax: [phone]  Website:  [email or web address] [email or web address] 09/07/2021, 2:20 PM

## 2021-09-12 ENCOUNTER — Ambulatory Visit: Payer: Self-pay

## 2021-09-12 ENCOUNTER — Other Ambulatory Visit: Payer: Self-pay | Admitting: Nurse Practitioner

## 2021-09-12 DIAGNOSIS — I10 Essential (primary) hypertension: Secondary | ICD-10-CM

## 2021-09-12 NOTE — Progress Notes (Signed)
This encounter was created in error - please disregard.

## 2021-09-12 NOTE — Patient Instructions (Signed)
Visit Information  Thank you for taking time to visit with me today. Please don't hesitate to contact me if I can be of assistance to you.   Following are the goals we discussed today:   Goals Addressed      I would like for my allergy symptoms to improve       Care Coordination Interventions: Evaluation of current treatment plan related to sinusitis and allergy exacerbation and patient's adherence to plan as established by provider Provided education to patient re: allergy triggers can worsen symptoms Reviewed medications with patient and discussed patient's current regimen is not relieving her of her symptoms  Advised patient to discuss worsening symptoms with provider Encouraged patient to ask her doctor about allergy testing Reviewed and discussed upcoming follow up with Dr. Nelva Bush scheduled for 09/19/21 '@3'$  PM Assessed social determinant of health barriers        I would like to know why I am getting so fatigued more easily       Care Coordination Interventions: Evaluation of current treatment plan related to worsening fatigue and patient's adherence to plan as established by provider Advised patient to discuss her symptoms with her PCP provider at next scheduled visit Determined patient is experiencing nightmares and vivid dreams all throughout the night that she believes is contributing to her fatigue Reviewed medications with patient and determined patient is not currently prescribed a sleep aid Reviewed and discussed upcoming follow up with PCP [name] FNP scheduled for 10/04/21 '@10'$ :40 AM  Collaborated with PCP [name] FNP regarding patient's c/o worsening fatigue that is interfering with her ability to complete her ADL's     Our next appointment is by telephone on 09/26/21 at 2:30 PM  Please call the care guide team at [phone] if you need to cancel or reschedule your appointment.   If you are experiencing a Mental Health or [place] or need  someone to talk to, please call 1-800-273-TALK (toll free, 24 hour hotline)  Patient verbalizes understanding of instructions and care plan provided today and agrees to view in [place]. Active MyChart status and patient understanding of how to access instructions and care plan via MyChart confirmed with patient.     Barb Merino, RN, BSN, CCM Care Management Coordinator [facility] Care Management Direct Phone: [phone]

## 2021-09-12 NOTE — Patient Outreach (Signed)
  Care Coordination   Follow Up Visit Note   09/12/2021 Name: Donna Gilmore MRN: [id number] DOB: [birth year]  Donna Gilmore is a 81 y.o. year old female who sees [name], [place] for primary care. I spoke with  Donna Gilmore by phone today  What matters to the patients health and wellness today?  Patient would like have her allergies and fatigue evaluated and treated.     Goals Addressed      I would like for my allergy symptoms to improve       Care Coordination Interventions: Evaluation of current treatment plan related to sinusitis and allergy exacerbation and patient's adherence to plan as established by provider Provided education to patient re: allergy triggers can worsen symptoms Reviewed medications with patient and discussed patient's current regimen is not relieving her of her symptoms  Advised patient to discuss worsening symptoms with provider Encouraged patient to ask her doctor about allergy testing Reviewed and discussed upcoming follow up with Dr. Nelva Bush scheduled for 09/19/21 '@3'$  PM Assessed social determinant of health barriers        I would like to know why I am getting so fatigued more easily       Care Coordination Interventions: Evaluation of current treatment plan related to worsening fatigue and patient's adherence to plan as established by provider Advised patient to discuss her symptoms with her PCP provider at next scheduled visit Determined patient is experiencing nightmares and vivid dreams all throughout the night that she believes is contributing to her fatigue Reviewed medications with patient and determined patient is not currently prescribed a sleep aid Reviewed and discussed upcoming follow up with PCP [name] FNP scheduled for 10/04/21 '@10'$ :21 AM  Collaborated with PCP [name] FNP regarding patient's c/o worsening fatigue that is interfering with her ability to complete her ADL's     SDOH assessments and interventions completed:   Yes     Care Coordination Interventions Activated:  Yes  Care Coordination Interventions:  Yes, provided   Follow up plan: Follow up call scheduled for 09/26/21 '@2'$ :30 PM     Encounter Outcome:  Pt. Visit Completed

## 2021-09-17 ENCOUNTER — Ambulatory Visit (INDEPENDENT_AMBULATORY_CARE_PROVIDER_SITE_OTHER): Payer: Medicare Other | Admitting: Podiatry

## 2021-09-17 ENCOUNTER — Encounter: Payer: Self-pay | Admitting: Podiatry

## 2021-09-17 DIAGNOSIS — M79674 Pain in right toe(s): Secondary | ICD-10-CM | POA: Diagnosis not present

## 2021-09-17 DIAGNOSIS — B351 Tinea unguium: Secondary | ICD-10-CM | POA: Diagnosis not present

## 2021-09-17 DIAGNOSIS — E1142 Type 2 diabetes mellitus with diabetic polyneuropathy: Secondary | ICD-10-CM

## 2021-09-17 DIAGNOSIS — M79675 Pain in left toe(s): Secondary | ICD-10-CM | POA: Diagnosis not present

## 2021-09-17 DIAGNOSIS — M792 Neuralgia and neuritis, unspecified: Secondary | ICD-10-CM | POA: Diagnosis not present

## 2021-09-18 DIAGNOSIS — H905 Unspecified sensorineural hearing loss: Secondary | ICD-10-CM | POA: Diagnosis not present

## 2021-09-19 ENCOUNTER — Ambulatory Visit: Payer: Medicare Other | Admitting: Allergy

## 2021-09-20 ENCOUNTER — Ambulatory Visit: Payer: Medicare Other | Admitting: Allergy

## 2021-09-20 ENCOUNTER — Encounter: Payer: Self-pay | Admitting: Allergy

## 2021-09-20 VITALS — BP 120/76 | HR 66 | Temp 97.8°F | Resp 18 | Ht 65.0 in | Wt 185.2 lb

## 2021-09-20 DIAGNOSIS — H01134 Eczematous dermatitis of left upper eyelid: Secondary | ICD-10-CM | POA: Diagnosis not present

## 2021-09-20 DIAGNOSIS — J3089 Other allergic rhinitis: Secondary | ICD-10-CM | POA: Diagnosis not present

## 2021-09-20 DIAGNOSIS — H01131 Eczematous dermatitis of right upper eyelid: Secondary | ICD-10-CM | POA: Diagnosis not present

## 2021-09-20 DIAGNOSIS — H01132 Eczematous dermatitis of right lower eyelid: Secondary | ICD-10-CM

## 2021-09-20 DIAGNOSIS — H01135 Eczematous dermatitis of left lower eyelid: Secondary | ICD-10-CM

## 2021-09-20 MED ORDER — PIMECROLIMUS 1 % EX CREA: TOPICAL_CREAM | CUTANEOUS | 5 refills | Status: DC

## 2021-09-20 NOTE — Patient Instructions (Signed)
-   allergy testing is positive to dust mites and cockroach.  Allergen avoidance measures provided.   - you can look into air purifiers for room or whole-home.  They help to purify the air you breathe in and reduce your allergen load in the home.  - continue Dymista 1 spray each nostril twice a day for nasal drainage and congestion control - can use small amount of vaseline on qtip to apply to the nose to help decrease dryness - for itching would use a long-acting antihistamine like Xyzal or Allegra daily as needed - for itching/rash of the eyelid and face use Elidel ointment apply thin layer to the skin as needed twice a day for itching or rash  Follow-up in 4-6 months or sooner if needed

## 2021-09-20 NOTE — Progress Notes (Signed)
This encounter was created in error - please disregard.

## 2021-09-20 NOTE — Progress Notes (Signed)
Follow-up Note  RE: Donna Gilmore MRN: [id number] DOB: 26-Jan-1941 Date of Office Visit: 09/20/2021   History of present illness: Donna Gilmore is a 81 y.o. female presenting today for follow-up of allergic rhinitis.  She was last seen in the office on 04/18/2021 by myself.  She has states that things have been going okay since her last visit. One of her main concerns today is that she has been having itching and burning of the eyelids.  This sensation also seems to travel down around the nasal bridge area and to her ear.  She has been rubbing her eyelids due to the itching and she has noted some discoloration of the lid.  She also states she is having some congestion still but notes that the Dymista is helpful.  She is using the Dymista in the mornings at this time.  It does help decrease drainage and congestion control. Since her last visit it appears she was started on Breztri inhaler which she says she has been using but she is not sure why she is using this medication.  Upon questioning she denies having any coughing episodes, wheezing, shortness of breath or any chest tightness issues.  She does not report a previous asthma history.  States she has a visit with her PCP next week and plans to discuss this medication again and see if it is something that she needs to be on.  Review of systems: Review of Systems  Constitutional: Negative.   HENT:         See HPI  Eyes: Negative.   Respiratory: Negative.    Cardiovascular: Negative.   Gastrointestinal: Negative.   Musculoskeletal: Negative.   Skin:        See HPI  Allergic/Immunologic: Negative.   Neurological: Negative.      All other systems negative unless noted above in HPI  Past medical/social/surgical/family history have been reviewed and are unchanged unless specifically indicated below.  No changes  Medication List: Current Outpatient Medications  Medication Sig Dispense Refill   acetaminophen (TYLENOL) 500 MG  tablet Take 1 tablet (500 mg total) by mouth every 6 (six) hours as needed. 30 tablet 0   albuterol (VENTOLIN HFA) 108 (90 Base) MCG/ACT inhaler Inhale 2 puffs into the lungs every 6 (six) hours as needed for wheezing or shortness of breath. 8 g 6   atorvastatin (LIPITOR) 80 MG tablet TAKE 1 TABLET BY MOUTH ONCE DAILY 90 tablet 3   Azelastine-Fluticasone (DYMISTA) 137-50 MCG/ACT SUSP Place 1 spray into the nose in the morning and at bedtime. 23 g 5   Budeson-Glycopyrrol-Formoterol (BREZTRI AEROSPHERE) 160-9-4.8 MCG/ACT AERO USE 2 INHALATIONS BY MOUTH  DAILY 32.1 g 2   cholecalciferol (VITAMIN D3) 25 MCG (1000 UNIT) tablet Take 1,000 Units by mouth daily.     clopidogrel (PLAVIX) 75 MG tablet TAKE 1 TABLET BY MOUTH  DAILY 90 tablet 3   cyclobenzaprine (FLEXERIL) 5 MG tablet      diazepam (VALIUM) 5 MG tablet Take 5 mg by mouth as needed for anxiety.     diclofenac Sodium (VOLTAREN) 1 % GEL Apply topically 4 (four) times daily.     donepezil (ARICEPT) 5 MG tablet TAKE 1 TABLET BY MOUTH  DAILY 90 tablet 3   ezetimibe (ZETIA) 10 MG tablet Take 1 tablet (10 mg total) by mouth daily. 90 tablet 3   fluorometholone (FML) 0.1 % ophthalmic suspension 1 drop 4 (four) times daily.     gabapentin (NEURONTIN) 100 MG capsule  TAKE 1 CAPSULE BY MOUTH 3  TIMES DAILY AS NEEDED 270 capsule 3   hydrochlorothiazide (HYDRODIURIL) 12.5 MG tablet      levocetirizine (XYZAL) 5 MG tablet Take 1 tablet (5 mg total) by mouth every evening. 90 tablet 1   meclizine (ANTIVERT) 25 MG tablet TAKE 1 TABLET BY MOUTH 2  TIMES EVERY DAY AS NEEDED 180 tablet 1   olmesartan (BENICAR) 40 MG tablet      olmesartan-hydrochlorothiazide (BENICAR HCT) 40-12.5 MG tablet TAKE 1 TABLET BY MOUTH DAILY 100 tablet 2   ONETOUCH ULTRA test strip USE WITH METER TO CHECK  BLOOD SUGAR BEFORE  BREAKFAST AND BEFORE DINNER 200 strip 3   pimecrolimus (ELIDEL) 1 % cream Apply thin layer to the skin as needed twice a day for itching or rash 30 g 5    Probiotic Product (PROBIOTIC-10 PO) Take by mouth. Take one tablet daily     Semaglutide (RYBELSUS) 7 MG TABS Take 1 tablet by mouth daily. 90 tablet 1   vitamin C (ASCORBIC ACID) 500 MG tablet Take 500 mg by mouth daily.     VITAMIN D PO Take 1 tablet by mouth daily.     Current Facility-Administered Medications  Medication Dose Route Frequency Provider Last Rate Last Admin   0.9 %  sodium chloride infusion  500 mL Intravenous Once [name], MD         Known medication allergies: No Known Allergies   Physical examination: Blood pressure 120/76, pulse 66, temperature 97.8 F (36.6 C), resp. rate 18, height '5\' 5"'$  (1.651 m), weight 185 lb 4 oz (84 kg), SpO2 99 %.  General: Alert, interactive, in no acute distress. HEENT: PERRLA, TMs pearly gray, turbinates minimally edematous without discharge, post-pharynx non erythematous. Neck: Supple without lymphadenopathy. Lungs: Clear to auscultation without wheezing, rhonchi or rales. {no increased work of breathing. CV: Normal S1, S2 without murmurs. Abdomen: Nondistended, nontender. Skin: Upper eyelid b/l with hyperpigmented patch and mild scale . Extremities:  No clubbing, cyanosis or edema. Neuro:   Grossly intact.  Diagnositics/Labs: Labs:  Component     Latest Ref Rng 04/18/2021  IgE (Immunoglobulin E), Serum     6 - 495 IU/mL 440   D Pteronyssinus IgE     Class I kU/L 0.32 !   D Farinae IgE     Class I kU/L 0.35 !   Cat Dander IgE     Class 0 kU/L <0.10   Dog Dander IgE     Class 0 kU/L <0.10   [place] Grass IgE     Class 0 kU/L <0.10   Timothy Grass IgE     Class 0 kU/L <0.10   Johnson Grass IgE     Class 0 kU/L <0.10   Cockroach, German IgE     Class II kU/L 1.03 !   Penicillium Chrysogen IgE     Class 0 kU/L <0.10   Cladosporium Herbarum IgE     Class 0 kU/L <0.10   Aspergillus Fumigatus IgE     Class 0 kU/L <0.10   Alternaria Alternata IgE     Class 0 kU/L <0.10   Maple/Box Elder IgE     Class 0 kU/L  <0.10   Common Silver Wendee Copp IgE     Class 0 kU/L <0.10   Cedar, [place] IgE     Class 0 kU/L <0.10   Oak, White IgE     Class 0 kU/L <0.10   Elm, American IgE  Class 0 kU/L <0.10   C[place]tonwood IgE     Class 0 kU/L <0.10   Pecan, Hickory IgE     Class 0 kU/L <0.10   White Mulberry IgE     Class 0 kU/L <0.10   Ragweed, Short IgE     Class 0 kU/L <0.10   Pigweed, Rough IgE     Class 0 kU/L <0.10   Sheep Sorrel IgE Qn     Class 0 kU/L <0.10   Mouse Urine IgE     Class 0 kU/L <0.10     Assessment and plan: Allergic rhinitis Eyelid dermatitis   - allergy testing is positive to dust mites and cockroach.  Allergen avoidance measures provided.   - you can look into air purifiers for room or whole-home.  They help to purify the air you breathe in and reduce your allergen load in the home.  - continue Dymista 1 spray each nostril twice a day for nasal drainage and congestion control - can use small amount of vaseline on qtip to apply to the nose to help decrease dryness - for itching would use a long-acting antihistamine like Xyzal or Allegra daily as needed - for itching/rash of the eyelid and face use Elidel ointment apply thin layer to the skin as needed twice a day for itching or rash  Follow-up in 4-6 months or sooner if needed  I appreciate the opportunity to take part in Donna Gilmore's care. Please do not hesitate to contact me with questions.  Sincerely,   Prudy Feeler, MD Allergy/Immunology [facility]

## 2021-09-21 ENCOUNTER — Telehealth: Payer: Self-pay

## 2021-09-21 NOTE — Chronic Care Management (AMB) (Signed)
   Donna Gilmore was reminded to have all medications, supplements and any blood glucose and blood pressure readings available for review with Orlando Penner, Pharm. D, at her telephone visit on 09-25-2021 at 12:00   Questions: Have you had any recent office visit or specialist visit outside of [place]? No  Are there any concerns you would like to discuss during your office visit? Patient stated no   Are you having any problems obtaining your medications? (Whether it pharmacy issues or cost) Patient stated no   If patient has any PAP medications ask if they are having any problems getting their PAP medication or refill? No PAP medication  Care Gaps: Covid booster overdue Flu vaccine   Star Rating Drug: Olmesartan-HCTZ 40-12.5 mg- Last filled 04-14-2021 90 [facility] ([facility] confirmed last refill was 07-08-2021 90 DS) Rybelsus 7 mg- Last filled 06-12-2021 90 [facility] ([facility] confirmed last refill was 06-12-2021 90 DS, Processing refill today.) Atorvastatin 80 mg- Last filled 04-14-2021 90 [facility] ([facility] confirmed last refill was 07-08-2021 90 DS)  Any gaps in medications fill history? Yes  [place] Pharmacist Assistant [phone]

## 2021-09-23 NOTE — Progress Notes (Signed)
  Subjective:  Patient ID: Donna Gilmore, female    DOB: [birth year],  MRN: [id number]  Donna Gilmore presents to clinic today for at risk foot care with history of diabetic neuropathy and painful thick toenails that are difficult to trim. Pain interferes with ambulation. Aggravating factors include wearing enclosed shoe gear. Pain is relieved with periodic professional debridement.  Patient states blood glucose was 111 mg/dl today. Last known HgA1c was unknown.  PCP is [name], FNP , and last visit was May 29, 2021.  Patient states she continues to have neuropathic pain. She is currently taking gabapentin.  No Known Allergies  Review of Systems: Negative except as noted in the HPI.  Objective: No changes noted in today's physical examination.  Vascular Examination: CFT <3 seconds b/l LE. Palpable DP pulse(s) b/l LE. Faintly palpable PT pulse(s) b/l LE. Pedal hair absent. No pain with calf compression b/l. Lower extremity skin temperature gradient within normal limits. Trace edema noted BLE. No ischemia or gangrene noted b/l LE. No cyanosis or clubbing noted b/l LE.Marland Kitchen  Dermatological Examination: Pedal skin with normal turgor, texture and tone b/l. No open wounds. No interdigital macerations b/l. Toenails 1-5  left, 2-5 right thickened, discolored, dystrophic with subungual debris. There is pain on palpation to dorsal aspect of nailplates. Anonychia noted R hallux. Nailbed(s) epithelialized. .  Neurological Examination: Protective sensation intact with 10 gram monofilament b/l LE. Vibratory sensation intact b/l LE. Pt has subjective symptoms of neuropathy.  Musculoskeletal Examination: Muscle strength 5/5 to all LE muscle groups b/l. No pain, crepitus or joint limitation noted with ROM bilateral LE. No gross bony deformities bilaterally.     Latest Ref Rng & Units 05/29/2021    2:23 PM 01/16/2021   12:47 PM 09/25/2020   11:33 AM  Hemoglobin A1C  Hemoglobin-A1c 4.8 - 5.6 % 7.2   6.8  6.9    Assessment/Plan: 1. Pain due to onychomycosis of toenails of both feet   2. Neuropathic pain   3. Type 2 diabetes mellitus with diabetic polyneuropathy, without long-term current use of insulin ([place])   -Examined patient. -Discussed neuropathy. She is taking gabapentin for her symptoms and will discuss with her PCP on her next visit. I also discussed OTC Nervive Cream which she can apply to her feet before bedtime. She will discuss with her PCP as well. -Toenails 2-5 bilaterally and L hallux debrided in length and girth without iatrogenic bleeding with sterile nail nipper and dremel.  -Patient/POA to call should there be question/concern in the interim.   Return in about 3 months (around 12/18/2021).  [name], DPM

## 2021-09-25 ENCOUNTER — Telehealth: Payer: Self-pay

## 2021-09-25 ENCOUNTER — Telehealth: Payer: Medicare Other

## 2021-09-25 NOTE — Progress Notes (Signed)
This encounter was created in error - please disregard.

## 2021-09-25 NOTE — Telephone Encounter (Signed)
  Care Management   Follow Up Note   09/25/2021 Name: Donna Gilmore MRN: [id number] DOB: 1940/04/30   Referred by: [name], FNP Reason for referral : No chief complaint on file.   Successful contact was made with patient to discuss care management and care coordination services. Patient reports doing well and no changes to need to be made and no concerns to be addressed,   Follow Up Plan: The patient has been provided with contact information for the care management team and has been advised to call with any health related questions or concerns.   Orlando Penner, CPP, PharmD Clinical Pharmacist Practitioner [facility] [phone]

## 2021-09-26 ENCOUNTER — Ambulatory Visit: Payer: Self-pay

## 2021-09-26 NOTE — Progress Notes (Signed)
This encounter was created in error - please disregard.

## 2021-09-26 NOTE — Patient Outreach (Signed)
  Care Coordination   Follow Up Visit Note   09/26/2021 Name: Donna Gilmore MRN: [id number] DOB: [birth year]  Donna Gilmore is a 81 y.o. year old female who sees [name], [place] for primary care. I spoke with  Candiss Norse Bobier by phone today.  What matters to the patients health and wellness today?  Patient would like for her allergies to improve.     Goals Addressed      I would like for my allergy symptoms to improve       Care Coordination Interventions: Determined patient completed her allergist follow up Review of patient status, including review of consultant's reports, relevant laboratory and other test results, and medications completed Reviewed medications with patient and discussed importance of medication adherence Determined patient tested positive for dust mites, she is concerned her carpet may need to be replaced to improve her health Placed SW referral requesting resources that may assist patient with needed home modification with carpet replacement      SDOH assessments and interventions completed:  No     Care Coordination Interventions Activated:  Yes  Care Coordination Interventions:  Yes, provided   Follow up plan: Referral made to [place] Follow up call scheduled for 10/08/21 '@10'$ :30 AM    Encounter Outcome:  Pt. Visit Completed

## 2021-09-26 NOTE — Patient Instructions (Signed)
Visit Information  Thank you for taking time to visit with me today. Please don't hesitate to contact me if I can be of assistance to you.   Following are the goals we discussed today:   Goals Addressed      I would like for my allergy symptoms to improve       Care Coordination Interventions: Determined patient completed her allergist follow up Review of patient status, including review of consultant's reports, relevant laboratory and other test results, and medications completed Reviewed medications with patient and discussed importance of medication adherence Determined patient tested positive for dust mites, she is concerned her carpet may need to be replaced to improve her health Placed SW referral requesting resources that may assist patient with needed home modification with carpet replacement       Our next appointment is by telephone on 10/08/21 at 10:30 AM  Please call the care guide team at [phone] if you need to cancel or reschedule your appointment.   If you are experiencing a Mental Health or [place] or need someone to talk to, please call 1-800-273-TALK (toll free, 24 hour hotline)  Patient verbalizes understanding of instructions and care plan provided today and agrees to view in [place]. Active MyChart status and patient understanding of how to access instructions and care plan via MyChart confirmed with patient.     Barb Merino, RN, BSN, CCM Care Management Coordinator [facility] Care Management  Direct Phone: [phone]

## 2021-10-02 ENCOUNTER — Ambulatory Visit: Payer: Self-pay
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
## 2021-10-04 ENCOUNTER — Encounter: Payer: Self-pay | Admitting: Nurse Practitioner

## 2021-10-04 ENCOUNTER — Ambulatory Visit (INDEPENDENT_AMBULATORY_CARE_PROVIDER_SITE_OTHER): Payer: Medicare Other | Admitting: Nurse Practitioner

## 2021-10-04 VITALS — BP 132/72 | HR 74 | Temp 97.5°F | Ht 65.0 in | Wt 184.0 lb

## 2021-10-04 DIAGNOSIS — Z79899 Other long term (current) drug therapy: Secondary | ICD-10-CM

## 2021-10-04 DIAGNOSIS — J449 Chronic obstructive pulmonary disease, unspecified: Secondary | ICD-10-CM

## 2021-10-04 DIAGNOSIS — Z0000 Encounter for general adult medical examination without abnormal findings: Secondary | ICD-10-CM

## 2021-10-04 DIAGNOSIS — I739 Peripheral vascular disease, unspecified: Secondary | ICD-10-CM

## 2021-10-04 DIAGNOSIS — R6889 Other general symptoms and signs: Secondary | ICD-10-CM

## 2021-10-04 DIAGNOSIS — I129 Hypertensive chronic kidney disease with stage 1 through stage 4 chronic kidney disease, or unspecified chronic kidney disease: Secondary | ICD-10-CM

## 2021-10-04 DIAGNOSIS — E1122 Type 2 diabetes mellitus with diabetic chronic kidney disease: Secondary | ICD-10-CM | POA: Diagnosis not present

## 2021-10-04 DIAGNOSIS — G629 Polyneuropathy, unspecified: Secondary | ICD-10-CM | POA: Diagnosis not present

## 2021-10-04 DIAGNOSIS — E1151 Type 2 diabetes mellitus with diabetic peripheral angiopathy without gangrene: Secondary | ICD-10-CM

## 2021-10-04 DIAGNOSIS — Z6830 Body mass index (BMI) 30.0-30.9, adult: Secondary | ICD-10-CM

## 2021-10-04 DIAGNOSIS — Z23 Encounter for immunization: Secondary | ICD-10-CM | POA: Diagnosis not present

## 2021-10-04 DIAGNOSIS — N1831 Chronic kidney disease, stage 3a: Secondary | ICD-10-CM

## 2021-10-04 DIAGNOSIS — R5382 Chronic fatigue, unspecified: Secondary | ICD-10-CM

## 2021-10-04 DIAGNOSIS — Z8669 Personal history of other diseases of the nervous system and sense organs: Secondary | ICD-10-CM

## 2021-10-04 DIAGNOSIS — E6609 Other obesity due to excess calories: Secondary | ICD-10-CM

## 2021-10-04 LAB — POCT URINALYSIS DIPSTICK
Bilirubin, UA: NEGATIVE
Blood, UA: NEGATIVE
Glucose, UA: NEGATIVE
Ketones, UA: NEGATIVE
Nitrite, UA: NEGATIVE
Protein, UA: NEGATIVE
Spec Grav, UA: 1.02 (ref 1.010–1.025)
Urobilinogen, UA: 0.2 E.U./dL
pH, UA: 5.5 (ref 5.0–8.0)

## 2021-10-04 NOTE — Progress Notes (Signed)
I,Donna Gilmore,acting as a Education administrator for [employer], FNP.,have documented all relevant documentation on the behalf of Donna Brine, FNP,as directed by  Donna Brine, FNP while in the presence of Donna Gilmore, Donna Gilmore.  Subjective:     Patient ID: Donna Gilmore , female    DOB: 1940/08/13 , 81 y.o.   MRN: [id number]   Chief Complaint  Patient presents with   Annual Exam    HPI  Here for HM. She has been suggested to get an air purifier to help with her allergies.  She has been to Podiatry and advised to use Nervine pain cream for her feet and had her nails clipped. She states "I don't know why I have to keep going to her" .  Wt Readings from Last 3 Encounters: 10/04/21 : 184 lb (83.5 kg) 09/20/21 : 185 lb 4 oz (84 kg) 09/07/21 : 183 lb 1.6 oz (83.1 kg)    Hypertension This is a chronic problem. The current episode started more than 1 year ago. The problem is unchanged. Pertinent negatives include no headaches. There are no associated agents to hypertension. Risk factors for coronary artery disease include sedentary lifestyle. Past treatments include diuretics and ACE inhibitors. There are no compliance problems.  Hypertensive end-organ damage includes angina. There is no history of chronic renal disease.  Diabetes She presents for her follow-up diabetic visit. She has type 2 diabetes mellitus. Pertinent negatives for hypoglycemia include no headaches. Associated symptoms include fatigue. There are no hypoglycemic complications. Risk factors for coronary artery disease include obesity and sedentary lifestyle. She is compliant with treatment all of the time. She is following a generally healthy diet. She has not had a previous visit with a dietitian. She rarely participates in exercise. An ACE inhibitor/angiotensin II receptor blocker is being taken. She does not see a podiatrist.Eye exam is not current.     Past Medical History:  Diagnosis Date   Benign paroxysmal positional vertigo  08/26/2012   CVA (cerebral infarction) 08/25/2012   Diabetes mellitus without complication ([name])    Dyslipidemia 08/25/2012   Essential hypertension, benign 08/25/2012   Hyperlipidemia    Hypertension    Meralgia paraesthetica, left 01/12/2018   Stroke [facility])      Family History  Problem Relation Age of Onset   Kidney failure Mother    Diabetes Mother    Alzheimer's disease Mother    Alzheimer's disease Father    Prostate cancer Father    Diabetes Maternal Grandmother    Diabetes Maternal Grandfather    Alzheimer's disease Paternal Grandmother    Alzheimer's disease Paternal Grandfather    Prostate cancer Paternal Uncle      Current Outpatient Medications:    acetaminophen (TYLENOL) 500 MG tablet, Take 1 tablet (500 mg total) by mouth every 6 (six) hours as needed., Disp: 30 tablet, Rfl: 0   albuterol (VENTOLIN HFA) 108 (90 Base) MCG/ACT inhaler, Inhale 2 puffs into the lungs every 6 (six) hours as needed for wheezing or shortness of breath., Disp: 8 g, Rfl: 6   atorvastatin (LIPITOR) 80 MG tablet, TAKE 1 TABLET BY MOUTH ONCE DAILY, Disp: 90 tablet, Rfl: 3   Azelastine-Fluticasone (DYMISTA) 137-50 MCG/ACT SUSP, Place 1 spray into the nose in the morning and at bedtime., Disp: 23 g, Rfl: 5   Budeson-Glycopyrrol-Formoterol (BREZTRI AEROSPHERE) 160-9-4.8 MCG/ACT AERO, USE 2 INHALATIONS BY MOUTH  DAILY, Disp: 32.1 g, Rfl: 2   cholecalciferol (VITAMIN D3) 25 MCG (1000 UNIT) tablet, Take 1,000 Units by mouth daily., Disp: ,  Rfl:    clopidogrel (PLAVIX) 75 MG tablet, TAKE 1 TABLET BY MOUTH  DAILY, Disp: 90 tablet, Rfl: 3   cyclobenzaprine (FLEXERIL) 5 MG tablet, , Disp: , Rfl:    diazepam (VALIUM) 5 MG tablet, Take 5 mg by mouth as needed for anxiety., Disp: , Rfl:    diclofenac Sodium (VOLTAREN) 1 % GEL, Apply topically 4 (four) times daily., Disp: , Rfl:    donepezil (ARICEPT) 5 MG tablet, TAKE 1 TABLET BY MOUTH  DAILY, Disp: 90 tablet, Rfl: 3   ezetimibe (ZETIA) 10 MG tablet, Take 1  tablet (10 mg total) by mouth daily., Disp: 90 tablet, Rfl: 3   fluorometholone (FML) 0.1 % ophthalmic suspension, 1 drop 4 (four) times daily., Disp: , Rfl:    gabapentin (NEURONTIN) 100 MG capsule, TAKE 1 CAPSULE BY MOUTH 3  TIMES DAILY AS NEEDED, Disp: 270 capsule, Rfl: 3   hydrochlorothiazide (HYDRODIURIL) 12.5 MG tablet, , Disp: , Rfl:    levocetirizine (XYZAL) 5 MG tablet, Take 1 tablet (5 mg total) by mouth every evening., Disp: 90 tablet, Rfl: 1   meclizine (ANTIVERT) 25 MG tablet, TAKE 1 TABLET BY MOUTH 2  TIMES EVERY DAY AS NEEDED, Disp: 180 tablet, Rfl: 1   olmesartan (BENICAR) 40 MG tablet, , Disp: , Rfl:    olmesartan-hydrochlorothiazide (BENICAR HCT) 40-12.5 MG tablet, TAKE 1 TABLET BY MOUTH DAILY, Disp: 100 tablet, Rfl: 2   ONETOUCH ULTRA test strip, USE WITH METER TO CHECK  BLOOD SUGAR BEFORE  BREAKFAST AND BEFORE DINNER, Disp: 200 strip, Rfl: 3   pimecrolimus (ELIDEL) 1 % cream, Apply thin layer to the skin as needed twice a day for itching or rash, Disp: 30 g, Rfl: 5   Probiotic Product (PROBIOTIC-10 PO), Take by mouth. Take one tablet daily, Disp: , Rfl:    Semaglutide (RYBELSUS) 7 MG TABS, Take 1 tablet by mouth daily., Disp: 90 tablet, Rfl: 1   vitamin C (ASCORBIC ACID) 500 MG tablet, Take 500 mg by mouth daily., Disp: , Rfl:    VITAMIN D PO, Take 1 tablet by mouth daily., Disp: , Rfl:   Current Facility-Administered Medications:    0.9 %  sodium chloride infusion, 500 mL, Intravenous, Once, [name], MD   No Known Allergies    The patient states she is post menopausal status. No LMP recorded. Patient is postmenopausal.. Negative for Dysmenorrhea and Negative for Menorrhagia. Negative for: breast discharge, breast lump(s), breast pain and breast self exam. Associated symptoms include abnormal vaginal bleeding. Pertinent negatives include abnormal bleeding (hematology), anxiety, decreased libido, depression, difficulty falling sleep, dyspareunia, history of infertility,  nocturia, sexual dysfunction, sleep disturbances, urinary incontinence, urinary urgency, vaginal discharge and vaginal itching. Diet regular.  The patient states her exercise level is minimal - she feels like when she walks her body gives out.  The patient's tobacco use is:  Social History   Tobacco Use  Smoking Status Former   Packs/day: 0.30   Years: 30.00   Total pack years: 9.00   Types: Cigarettes   Quit date: 2019   Years since quitting: 4.6  Smokeless Tobacco Never  . She has been exposed to passive smoke. The patient's alcohol use is:  Social History   Substance and Sexual Activity  Alcohol Use Not Currently   Comment: occasional     Review of Systems  Constitutional:  Positive for appetite change (decreased) and fatigue.  HENT:  Negative for rhinorrhea.        Nasal itching  Eyes: Negative.   Respiratory: Negative.    Cardiovascular: Negative.   Gastrointestinal: Negative.   Endocrine: Negative.   Genitourinary: Negative.   Musculoskeletal: Negative.   Skin: Negative.   Allergic/Immunologic: Negative.   Neurological: Negative.  Negative for headaches.  Hematological: Negative.   Psychiatric/Behavioral: Negative.         Vivid dreams     Today's Vitals   10/04/21 1038  BP: 132/72  Pulse: 74  Temp: (!) 97.5 F (36.4 C)  TempSrc: Oral  Weight: 184 lb (83.5 kg)  Height: 5' 5"  (1.651 m)   Body mass index is 30.62 kg/m.  Wt Readings from Last 3 Encounters:  10/04/21 184 lb (83.5 kg)  09/20/21 185 lb 4 oz (84 kg)  09/07/21 183 lb 1.6 oz (83.1 kg)    Objective:  Physical Exam Vitals reviewed.  Constitutional:      General: She is not in acute distress.    Appearance: Normal appearance. She is well-developed.  HENT:     Head: Atraumatic.     Right Ear: Hearing, tympanic membrane, ear canal and external ear normal. There is no impacted cerumen.     Left Ear: Hearing, tympanic membrane, ear canal and external ear normal. There is no impacted cerumen.      Nose:     Comments: Deferred - masked    Mouth/Throat:     Comments: Deferred - masked Eyes:     General: Lids are normal.     Conjunctiva/sclera: Conjunctivae normal.     Pupils: Pupils are equal, round, and reactive to light.     Funduscopic exam:    Right eye: No papilledema.        Left eye: No papilledema.  Neck:     Thyroid: No thyroid mass.     Vascular: No carotid bruit.  Cardiovascular:     Rate and Rhythm: Normal rate and regular rhythm.     Pulses: Normal pulses.     Heart sounds: Normal heart sounds. No murmur heard. Pulmonary:     Effort: Pulmonary effort is normal. No respiratory distress.     Breath sounds: Normal breath sounds. No wheezing.  Abdominal:     General: Abdomen is flat. Bowel sounds are normal.     Palpations: Abdomen is soft.  Musculoskeletal:        General: No swelling. Normal range of motion.     Cervical back: Full passive range of motion without pain, normal range of motion and neck supple.     Right lower leg: No edema.     Left lower leg: No edema.  Skin:    General: Skin is warm and dry.     Capillary Refill: Capillary refill takes less than 2 seconds.  Neurological:     General: No focal deficit present.     Mental Status: She is alert and oriented to person, place, and time.     Cranial Nerves: No cranial nerve deficit.     Sensory: No sensory deficit.     Motor: No weakness.  Psychiatric:        Mood and Affect: Mood normal.        Behavior: Behavior normal.        Thought Content: Thought content normal.        Judgment: Judgment normal.         Assessment And Plan:     1. Encounter for annual physical exam Behavior modifications discussed and diet history reviewed.   Pt will continue  to exercise regularly and modify diet with low GI, plant based foods and decrease intake of processed foods.  Recommend intake of daily multivitamin, Vitamin D, and calcium.  Recommend mammogram for preventive screenings, as well as  recommend immunizations that include influenza, TDAP, and Shingles  2. Hypertensive nephropathy Comments: Blood pressure is well controlled, continue current medications - POCT Urinalysis Dipstick (81002) - Microalbumin / Creatinine Urine Ratio - EKG 12-Lead - Comprehensive metabolic panel with eGFR (no eGFR if sent to [facility]) - Amb Referral To Provider Referral Exercise Program (P.R.E.P)  3. Stage 3a chronic kidney disease ([place]) Comments: Kidney functions slightly declined, encouraged to stay well hydrated with water. Will check microalbumin. - Comprehensive metabolic panel with eGFR (no eGFR if sent to [facility])  4. Type 2 diabetes mellitus with diabetic peripheral angiopathy without gangrene, without long-term current use of insulin ([facility]) Comments: HgbA1c was slightly elevated at last visit, she is now back on Rybelsus and tolerating well. Continue current medications  - Hemoglobin A1c - Lipid panel - Comprehensive metabolic panel with eGFR (no eGFR if sent to [facility]) - Amb Referral To Provider Referral Exercise Program (P.R.E.P)  5. Peripheral vascular disease ([place]) Comments: Stable, pulses are good bilateral lower extremities. No wounds present or complaints of calf cramping  6. COPD with asthma ([place]) Comments: Stable, no current issues  7. Chronic fatigue Comments: Will check metabolic cause and refer for sleep study as she had a hisotry of Sleep apnea and is not currently on CPAP - Vitamin B12 - VITAMIN D 25 Hydroxy (Vit-D Deficiency, Fractures) - Amb Referral To Provider Referral Exercise Program (P.R.E.P) - Ambulatory referral to Sleep Studies - TSH  8. Vivid dream Comments: Will refer for sleep study and encouraged to not eat late at night.  - Ambulatory referral to Sleep Studies  9. Peripheral polyneuropathy Comments: Encouraged to use Nervine cream recommended by Podiatry  10. History of sleep apnea Comments: Had sleep study in 2015 indicated having sleep apnea but  she does not recall what was to be done next and is not using a CPAP at this time - Ambulatory referral to Sleep Studies  11. Class 1 obesity due to excess calories with serious comorbidity and body mass index (BMI) of 30.0 to 30.9 in adult Comments: Will refer to PREP this may help motivate her to exercise more regularly.  She is encouraged to strive for BMI less than 30 to decrease cardiac risk. Advised to aim for at least 150 minutes of exercise per week.  12. Other long term (current) drug therapy - CBC  13. Need for influenza vaccination Influenza vaccine administered Encouraged to take Tylenol as needed for fever or muscle aches. - Flu Vaccine QUAD High Dose(Fluad)    Patient was given opportunity to ask questions. Patient verbalized understanding of the plan and was able to repeat key elements of the plan. All questions were answered to their satisfaction.   Donna Brine, FNP   I, Donna Brine, FNP, have reviewed all documentation for this visit. The documentation on 10/04/21 for the exam, diagnosis, procedures, and orders are all accurate and complete.   THE PATIENT IS ENCOURAGED TO PRACTICE SOCIAL DISTANCING DUE TO THE COVID-19 PANDEMIC.

## 2021-10-04 NOTE — Patient Instructions (Addendum)

## 2021-10-05 LAB — CBC
Hematocrit: 36.4 % (ref 34.0–46.6)
Hemoglobin: 11.5 g/dL (ref 11.1–15.9)
MCH: 28 pg (ref 26.6–33.0)
MCHC: 31.6 g/dL (ref 31.5–35.7)
MCV: 89 fL (ref 79–97)
Platelets: 255 10*3/uL (ref 150–450)
RBC: 4.11 x10E6/uL (ref 3.77–5.28)
RDW: 14.5 % (ref 11.7–15.4)
WBC: 6.6 10*3/uL (ref 3.4–10.8)

## 2021-10-05 LAB — VITAMIN B12: Vitamin B-12: 995 pg/mL (ref 232–1245)

## 2021-10-05 LAB — COMPREHENSIVE METABOLIC PANEL
ALT: 13 IU/L (ref 0–32)
AST: 21 IU/L (ref 0–40)
Albumin/Globulin Ratio: 1.7 (ref 1.2–2.2)
Albumin: 4.3 g/dL (ref 3.7–4.7)
Alkaline Phosphatase: 106 IU/L (ref 44–121)
BUN/Creatinine Ratio: 7 — ABNORMAL LOW (ref 12–28)
BUN: 10 mg/dL (ref 8–27)
Bilirubin Total: 0.3 mg/dL (ref 0.0–1.2)
CO2: 23 mmol/L (ref 20–29)
Calcium: 9.6 mg/dL (ref 8.7–10.3)
Chloride: 104 mmol/L (ref 96–106)
Creatinine, Ser: 1.4 mg/dL — ABNORMAL HIGH (ref 0.57–1.00)
Globulin, Total: 2.5 g/dL (ref 1.5–4.5)
Glucose: 97 mg/dL (ref 70–99)
Potassium: 4.4 mmol/L (ref 3.5–5.2)
Sodium: 143 mmol/L (ref 134–144)
Total Protein: 6.8 g/dL (ref 6.0–8.5)
eGFR: 38 mL/min/{1.73_m2} — ABNORMAL LOW (ref >59)

## 2021-10-05 LAB — HEMOGLOBIN A1C
Est. average glucose Bld gHb Est-mCnc: 163 mg/dL
Hgb A1c MFr Bld: 7.3 % — ABNORMAL HIGH (ref 4.8–5.6)

## 2021-10-05 LAB — VITAMIN D 25 HYDROXY (VIT D DEFICIENCY, FRACTURES): Vit D, 25-Hydroxy: 47.5 ng/mL (ref 30.0–100.0)

## 2021-10-05 LAB — LIPID PANEL
Chol/HDL Ratio: 2.7 ratio (ref 0.0–4.4)
Cholesterol, Total: 140 mg/dL (ref 100–199)
HDL: 52 mg/dL (ref >39)
LDL Chol Calc (NIH): 58 mg/dL (ref 0–99)
Triglycerides: 184 mg/dL — ABNORMAL HIGH (ref 0–149)
VLDL Cholesterol Cal: 30 mg/dL (ref 5–40)

## 2021-10-05 LAB — MICROALBUMIN / CREATININE URINE RATIO
Creatinine, Urine: 112.6 mg/dL
Microalb/Creat Ratio: 21 mg/g creat (ref 0–29)
Microalbumin, Urine: 23.4 ug/mL

## 2021-10-05 LAB — TSH: TSH: 3.91 u[IU]/mL (ref 0.450–4.500)

## 2021-10-05 NOTE — Progress Notes (Signed)
Call to pt today reference PREP referral Explained program to pt and location class offered Interested in participating. Can do afternoon and can start in Oct MW 230p-345p Will call her closer to start of class to do intake.

## 2021-10-15 ENCOUNTER — Ambulatory Visit: Payer: Self-pay

## 2021-10-15 NOTE — Patient Outreach (Signed)
  Care Coordination   Follow Up Visit Note   10/15/2021 Name: Donna Gilmore MRN: [id number] DOB: 23-Oct-1940  Donna Gilmore is a 81 y.o. year old female who sees [name], [place] for primary care. I spoke with  Candiss Norse Nylen by phone today.  What matters to the patients health and wellness today?  Patient is scheduled to start the provider exercise program in October. Patient feels her allergy symptoms have improved.     Goals Addressed             This Visit's Progress    I would like for my allergy symptoms to improve (GOAL COMPLETED)       Care Coordination Interventions: Determined patient completed her allergist follow up Determined patient invested in several home purifiers and feels her symptoms have improved Discussed patient continues to take her allergy medications as prescribed Instructed patient to keep her doctor informed of new symptoms or concerns      I would like to know why I am getting so fatigued more easily       Care Coordination Interventions: Evaluation of current treatment plan related to worsening fatigue and patient's adherence to plan as established by provider Determined patient will participate in the PREP program, she is scheduled to start on 10/29/21 Positive reinforcement given to patient for making efforts to improve her health         SDOH assessments and interventions completed:  No     Care Coordination Interventions Activated:  Yes  Care Coordination Interventions:  Yes, provided   Follow up plan: Follow up call scheduled for 12/14/21 '@09'$ :15 AM     Encounter Outcome:  Pt. Visit Completed

## 2021-10-15 NOTE — Patient Instructions (Addendum)
Visit Information  Thank you for taking time to visit with me today. Please don't hesitate to contact me if I can be of assistance to you.   Following are the goals we discussed today:   Goals Addressed             This Visit's Progress    I would like for my allergy symptoms to improve (GOAL COMPLETED)       Care Coordination Interventions: Determined patient completed her allergist follow up Determined patient invested in several home purifiers and feels her symptoms have improved Discussed patient continues to take her allergy medications as prescribed Instructed patient to keep her doctor informed of new symptoms or concerns      I would like to know why I am getting so fatigued more easily       Care Coordination Interventions: Evaluation of current treatment plan related to worsening fatigue and patient's adherence to plan as established by provider Determined patient will participate in the PREP program, she is scheduled to start on 10/29/21 Positive reinforcement given to patient for making efforts to improve her health         Our next appointment is by telephone on 12/14/21 at 09:15 AM  Please call the care guide team at [phone] if you need to cancel or reschedule your appointment.   If you are experiencing a Mental Health or [place] or need someone to talk to, please call 1-800-273-TALK (toll free, 24 hour hotline)  Patient verbalizes understanding of instructions and care plan provided today and agrees to view in [place]. Active MyChart status and patient understanding of how to access instructions and care plan via MyChart confirmed with patient.     Barb Merino, RN, BSN, CCM Care Management Coordinator [facility] Direct Phone: [phone]

## 2021-10-19 ENCOUNTER — Other Ambulatory Visit: Payer: Self-pay

## 2021-10-19 ENCOUNTER — Other Ambulatory Visit: Payer: Self-pay | Admitting: Nurse Practitioner

## 2021-10-19 MED ORDER — ONETOUCH ULTRA VI STRP: ORAL_STRIP | 3 refills | Status: DC

## 2021-10-25 ENCOUNTER — Encounter: Payer: Self-pay | Admitting: Nurse Practitioner

## 2021-10-30 ENCOUNTER — Ambulatory Visit: Payer: Self-pay

## 2021-10-30 NOTE — Patient Outreach (Signed)
  Care Coordination   Follow Up Visit Note   10/30/2021 Name: Donna Gilmore MRN: [id number] DOB: [birth year]  Donna Gilmore is a 81 y.o. year old female who sees [name], [place] for primary care. I spoke with  Donna Gilmore by phone today.  What matters to the patients health and wellness today?  I would like more help at home    Goals Addressed             This Visit's Progress    COMPLETED: I need more help with my home - BSW care plan       Care Coordination Interventions: Spoke with patient to confirm she received a packet in the mail from [employer] requesting she complete a form regarding home needs and send back in Education provided on the Aging Gracefully program to the patient Donna Gilmore participates in this program Discussed plans for patient to complete form and return Determined the patient did complete a visit with DSS [facility] [employer] which she was previously referred to; patient will begin receiving assistance with light housekeeping once weekly Instructed the patient to contact SW as needed        SDOH assessments and interventions completed:  No     Care Coordination Interventions Activated:  Yes  Care Coordination Interventions:  Yes, provided   Follow up plan: No further intervention required.   Encounter Outcome:  Pt. Visit Completed   Donna Gilmore, BSW, CDP Social Worker, Certified Dementia Practitioner [place] [facility]  Care Coordination [phone]

## 2021-10-30 NOTE — Patient Instructions (Signed)
Visit Information  Thank you for taking time to visit with me today. Please don't hesitate to contact me if I can be of assistance to you.   Following are the goals we discussed today:   Goals Addressed             This Visit's Progress    COMPLETED: I need more help with my home - BSW care plan       Care Coordination Interventions: Spoke with patient to confirm she received a packet in the mail from [employer] requesting she complete a form regarding home needs and send back in Education provided on the Aging Gracefully program to the patient [place] participates in this program Discussed plans for patient to complete form and return Determined the patient did complete a visit with DSS [facility] [employer] which she was previously referred to; patient will begin receiving assistance with light housekeeping once weekly Instructed the patient to contact SW as needed        If you are experiencing a Mental Health or [place] or need someone to talk to, please call 1-800-273-TALK (toll free, 24 hour hotline)  Patient verbalizes understanding of instructions and care plan provided today and agrees to view in [place]. Active MyChart status and patient understanding of how to access instructions and care plan via MyChart confirmed with patient.     No further follow up required: Please contact me as needed  Daneen Schick, BSW, CDP Social Worker, Certified Dementia Practitioner [place] [phone]

## 2021-10-31 ENCOUNTER — Encounter: Payer: Self-pay | Admitting: Nurse Practitioner

## 2021-11-19 ENCOUNTER — Telehealth: Payer: Self-pay

## 2021-11-19 NOTE — Telephone Encounter (Signed)
Recalled pt reference starting PREP on Oct 30th M/W 230p-345p Pt would miss every Wednesdays due to having caregiver those days.  Could do T/TH same time Offered next class starting on Nov 28th.  Pt doesn't want to wait however that's the 1st available T/Th  Will call her closer to start of class

## 2021-11-20 ENCOUNTER — Ambulatory Visit (INDEPENDENT_AMBULATORY_CARE_PROVIDER_SITE_OTHER): Payer: Medicare Other | Admitting: Neurology

## 2021-11-20 ENCOUNTER — Institutional Professional Consult (permissible substitution): Payer: Medicare Other | Admitting: Neurology

## 2021-11-20 VITALS — BP 129/70 | HR 60 | Ht 65.0 in | Wt 187.8 lb

## 2021-11-20 DIAGNOSIS — R351 Nocturia: Secondary | ICD-10-CM

## 2021-11-20 DIAGNOSIS — G4752 REM sleep behavior disorder: Secondary | ICD-10-CM

## 2021-11-20 DIAGNOSIS — G4721 Circadian rhythm sleep disorder, delayed sleep phase type: Secondary | ICD-10-CM

## 2021-11-20 DIAGNOSIS — J449 Chronic obstructive pulmonary disease, unspecified: Secondary | ICD-10-CM | POA: Diagnosis not present

## 2021-11-20 DIAGNOSIS — G4733 Obstructive sleep apnea (adult) (pediatric): Secondary | ICD-10-CM

## 2021-11-20 MED ORDER — MELATONIN 5 MG PO CAPS: 5.0000 mg | ORAL_CAPSULE | Freq: Every day | ORAL | 0 refills | Status: DC

## 2021-11-20 NOTE — Patient Instructions (Signed)
After spending a total time of  60  minutes face to face and additional time for physical and neurologic examination, review of laboratory studies,  personal review of imaging studies, reports and results of other testing and review of referral information / records as far as provided in visit, I have established the following assessments:  1) OSA ? this patient who is new to me has a history of COPD with asthma, a 30-year history of smoking, history of moderate sleep apnea in form of hypopnea documented at [facility] long sleep center in October 2015.  What I did not find is if she ever was started on CPAP, what kind of recommendations followed the diagnosis in 2015.  The patient had 1 stroke in 2014 for which she had been followed last by Dr. Erlinda Hong and Charlott Holler, she has not been an active patient here for sleep related medical problems ever.  2) What she describes today indicates the presence of REM sleep behavior disorder related to dream enactment, the character of the dreams which is mostly unpleasant threatening and causes a fearful reaction of defense, her son's description that she is yelling or saying things in her sleep but she is not sleepwalking.   3) She denies any visual hallucinations or misperceptions, and she describes also that the dreams she suffers are often repeatedly present through the night.  Her physical exam showed no evidence of parkinsonism.    She has received previously a diagnosis of mild cognitive impairment but since she did not bring her functioning hearing aids with her today I do not think we can do a valid memory test here.  However a memory disorder has not been differentiated in the past such as vascular dementia, memory loss leading to an Alzheimer type memory loss or lewy body dementia.   4) she laments Insomnia, too-  poor sleep hygiene, floating rise time, staying many more hours in bed than needed for sleep- this may have been a decade long problems. The covid  pandemic affected all her social network activities and she has missed those badly-  DEPRESSION>    My Plan is to proceed with:  1) screening by HST for apnea, but we would need a PSG for parasomnia montage.I ordered a REM BD montage PSG. She may have apnea , but the diagnosis can be made by the same test.  2) Please refer for audiology work up and hearing aids- this will allow MOCA/ MMSE testing to be valid in the future.  3) RV in 2-4 months  Start melatonin at 5 mg at 10 PM.  we will consider dementia work up if indicated after MOCA/ MMSE

## 2021-11-20 NOTE — Progress Notes (Signed)
SLEEP MEDICINE CLINIC    Provider:  Larey Seat, MD  Primary Care Physician:  [name], [place] [address]     Referring Provider: [name], [place] [place],  [place] [address]          Chief Complaint according to patient   Patient presents with:     New Patient (Initial Visit)           HISTORY OF PRESENT ILLNESS:  Donna Gilmore is a 81 y.o. year old 37 or [place] American female patient seen here as a referral on 11/20/2021 from PCP. This referral is for a sleep evaluation this patient has been followed by dr Erlinda Hong until 2018 with vascular dementia, mild cognitive impairment. Dx in 2018. She lives with her oldest son of 3 sons, has one daughter.   Chief concern according to patient : " History of sleep apnea, vivid dreams,  chronic fatigue".   I have the pleasure of seeing Donna Gilmore today, a right-handed [place] or [place] American female with a possible sleep disorder.  Donna Gilmore was accompanied by no one. she is very hard of hearing and the interview process is prolonged and difficult.   Donna Gilmore is a meanwhile 81 year old lady who reports that she has frequent terrible dreams.  These wake her up she feels threatened and very upset by the content of her dreams in her dreams she has to fight off intruders or people that follow her it is a more night marriage content.  She wakes up but normally not out of dreams.  Her son stated to her that she yells, and kicks and boxes. She has not left the bed, has not fallen out of bed.  She described that the dreams are all night long, yet this is not verified.         Here are previous visits for stroke follow up ( last stroke in 2014) Dr Erlinda Hong:    History Summary Donna Gilmore is an 81 y.o. female with know HTN and hyperlipidemia was admitted to [facility] in 07/2012 due to "dizzy, weak and felt light headed. This continued for 2 weeks". She  denies vertiginous sensation or room spinning. On admission her BP was 147/69 and most recent was 151/67. She was found to have bilaterally cerebellar small infarct but also high grade stenosis of right VA and BA, occluded left VA. She was put on plavix and high dose lipitor.     11/09/12 (LL): Patient comes in for hospital stroke follow up. She has been doing well, but has had continued intermittent lightheadedness, which she has experienced for the past 15 years or more. She states she is worried that she will have another stroke and she is under a lot of stress right now. Her 65 year old mother is living with her and is on Hospice care, and there is discontent between her and her siblings. She has been tolerating Plavix without side effects and is taking all of her medications as prescribed. She states her BP is normally in the 130s but is elevated in office today, 158/91.   02/16/13 (LL): Ms. Gilmore returns to the office for stroke revisit. Her lightheadedness is much better, she takes Meclizine twice daily. She continues to be under a lot of stress with her mothers illness and her siblings. She has been tolerating Plavix without side effects and is taking all of her  medications as prescribed. She does not monitor her BP at home. It is elevated in office today, 154/81.    08/12/13 follow up - Patient has been doing the same. She still stated that she has intermittent lightheadedness but not vertigo. The lightheadedness can be associated with neck movement but also can happen even she is still. This is no change as before. She is doing better stress wise as her mom passed away. She continues to smoke and she said she tried nicotine patch before but does not work and she does not want try else anymore.  She has a  has a past medical history of Benign paroxysmal positional vertigo (08/26/2012), CVA (cerebral infarction) (08/25/2012), Diabetes mellitus without complication ([place]), Dyslipidemia (08/25/2012),  Essential hypertension, benign (08/25/2012), Hyperlipidemia, Hypertension, Meralgia paraesthetica, left (01/12/2018), and Stroke ([place]).   The patient had the first sleep study in the year 2015  with a result of an AHI ( Apnea Hypopnea index)  of 20, these were all hypopneic events, there were few frank apnea hours and of those centrals outnumber obstructive events. patient in pulmonology and had her last documented pulmonary function test in 2022- the study was interpreted by Dr. Baird Lyons and was provided by [facility] pulmonology. Her sleep architecture was horrendously fragmented.   She is an active  patient in pulmonology and had her last documented pulmonary function test in 2022 the study was interpreted by Dr. Baird Lyons and was provided by [facility] pulmonology.   Family medical /sleep history: No  other family member on CPAP with OSA, insomnia, sleep walkers.    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  Tobacco use: quit in 2019 (!) 30 pack years, [place]- .  ETOH use ; none ,  Caffeine intake in form of Coffee( 2 cups in AM ) Soda( colas/ 1-2 cans a day) Tea ( /) or energy drinks. Regular exercise: none. Hobbies : reading,       Sleep habits are as follows: The patient's dinner time is between 6-7 PM. The patient goes to bed at 11 PM and She watches the 11 o clock news and goes to the bathroom and then 3 more times during the night.  She continues to sleep for a total time of 5-6 hours.   The preferred sleep position is lateral and supine, not prone, with the support of 2 pillows. Dreams are reportedly  frequent/vivid.  10  AM is the usual rise time(!) that's after 11 hours in bed.  The patient wakes up spontaneously.  She reports not feeling refreshed or restored in AM, with symptoms such as dry mouth, morning headaches, and residual fatigue.   Naps are not taken . Review of Systems: Out of a complete 14 system review, the patient complains of only the following symptoms, and all other reviewed systems are negative.:  Fatigue, sleepiness , snoring, fragmented sleep.   How likely are you to doze in the following situations:  0 = not likely, 1 = slight chance, 2 = moderate chance, 3 = high chance   Sitting and Reading? Watching Television? Sitting inactive in a public place (theater or meeting)? As a passenger in a car for an hour without a break? Lying down in the afternoon when circumstances permit? Sitting and talking to someone? Sitting quietly after lunch without alcohol?  In a car, while stopped for a few minutes in traffic? - no longer driving.    Total = 7/ 24 points   FSS endorsed at 63/ 63 points.  GDS 8/ 15 , clinically depressed- " I am in my house all the time, I need company and I need to move" .  Social History   Socioeconomic History   Marital status: Divorced    Spouse name: Not on file   Number of children: 4   Years of education: Not on file   Highest education level: Not on file  Occupational History   Occupation: retired  Tobacco Use   Smoking status: Former    Packs/day: 0.30    Years: 30.00    Total pack years: 9.00    Types: Cigarettes    Quit date: 2019    Years since quitting: 4.8   Smokeless tobacco: Never  Vaping Use   Vaping Use: Never used  Substance and Sexual Activity   Alcohol use: Not Currently    Comment: occasional   Drug use: No   Sexual activity: Not Currently  Other Topics Concern   Not on file  Social History Narrative   Not on file   Social Determinants of Health   Financial Resource Strain: Low Risk  (04/05/2021)   Overall Financial Resource Strain (CARDIA)    Difficulty of Paying Living Expenses: Not hard at all  Food Insecurity: No Food Insecurity (04/05/2021)   Hunger Vital Sign    Worried About Running Out of Food in the Last Year: Never true    [place] in the Last Year: Never true  Transportation Needs: No Transportation Needs (04/05/2021)   PRAPARE - Hydrologist (Medical): No    Lack of Transportation (Non-Medical): No  Physical Activity: Inactive (04/05/2021)   Exercise Vital Sign    Days of Exercise per Week: 0 days    Minutes of Exercise per Session: 0 min  Stress: No Stress Concern Present (04/05/2021)   [place]    Feeling of Stress : Not at all  Social Connections: Not on file    Family History  Problem Relation Age of Onset   Kidney failure Mother    Diabetes Mother    Alzheimer's disease Mother    Alzheimer's disease Father    Prostate cancer Father    Diabetes Maternal Grandmother    Diabetes Maternal Grandfather    Alzheimer's disease Paternal Grandmother    Alzheimer's disease Paternal Grandfather    Prostate cancer Paternal Uncle     Past Medical History:  Diagnosis Date   Benign paroxysmal positional vertigo 08/26/2012   CVA (cerebral infarction) 08/25/2012   Diabetes mellitus without complication ([place])    Dyslipidemia 08/25/2012   Essential hypertension, benign 08/25/2012   Hyperlipidemia    Hypertension    Meralgia paraesthetica, left 01/12/2018   Stroke ([place])     No past surgical history on file.   Current Outpatient Medications on File Prior to Visit  Medication Sig Dispense Refill   acetaminophen (TYLENOL) 500 MG tablet Take 1 tablet (500 mg total) by mouth every 6 (six) hours as needed. 30 tablet 0   albuterol (VENTOLIN HFA) 108 (90 Base) MCG/ACT inhaler Inhale 2 puffs into the lungs every 6 (six) hours as needed for wheezing or shortness of breath. 8 g 6   atorvastatin (LIPITOR) 80 MG tablet TAKE 1 TABLET BY MOUTH ONCE DAILY 90  tablet 3   Azelastine-Fluticasone (DYMISTA) 137-50 MCG/ACT SUSP Place 1 spray into the nose in the morning and at bedtime. 23 g 5   Budeson-Glycopyrrol-Formoterol (BREZTRI  AEROSPHERE) 160-9-4.8 MCG/ACT AERO USE 2 INHALATIONS BY MOUTH  DAILY 32.1 g 2   cholecalciferol (VITAMIN D3) 25 MCG (1000 UNIT) tablet Take 1,000 Units by mouth daily.     clopidogrel (PLAVIX) 75 MG tablet TAKE 1 TABLET BY MOUTH  DAILY 90 tablet 3   cyclobenzaprine (FLEXERIL) 5 MG tablet      diazepam (VALIUM) 5 MG tablet Take 5 mg by mouth as needed for anxiety.     diclofenac Sodium (VOLTAREN) 1 % GEL Apply topically 4 (four) times daily.     donepezil (ARICEPT) 5 MG tablet TAKE 1 TABLET BY MOUTH  DAILY 90 tablet 3   ezetimibe (ZETIA) 10 MG tablet Take 1 tablet (10 mg total) by mouth daily. 90 tablet 3   fluorometholone (FML) 0.1 % ophthalmic suspension 1 drop 4 (four) times daily.     gabapentin (NEURONTIN) 100 MG capsule TAKE 1 CAPSULE BY MOUTH 3  TIMES DAILY AS NEEDED 270 capsule 3   glucose blood (ONETOUCH ULTRA) test strip USE WITH METER TO CHECK BLOOD SUGAR BEFORE BREAKFAST AND BEFORE DINNER 100 each 0   hydrochlorothiazide (HYDRODIURIL) 12.5 MG tablet      levocetirizine (XYZAL) 5 MG tablet Take 1 tablet (5 mg total) by mouth every evening. 90 tablet 1   meclizine (ANTIVERT) 25 MG tablet TAKE 1 TABLET BY MOUTH 2  TIMES EVERY DAY AS NEEDED 180 tablet 1   olmesartan (BENICAR) 40 MG tablet      olmesartan-hydrochlorothiazide (BENICAR HCT) 40-12.5 MG tablet TAKE 1 TABLET BY MOUTH DAILY 100 tablet 2   pimecrolimus (ELIDEL) 1 % cream Apply thin layer to the skin as needed twice a day for itching or rash 30 g 5   Probiotic Product (PROBIOTIC-10 PO) Take by mouth. Take one tablet daily     Semaglutide (RYBELSUS) 7 MG TABS Take 1 tablet by mouth daily. 90 tablet 1   vitamin C (ASCORBIC ACID) 500 MG tablet Take 500 mg by mouth daily.     VITAMIN D PO Take 1 tablet by mouth daily.     Current Facility-Administered Medications on File Prior to Visit  Medication Dose Route Frequency Provider Last Rate Last Admin   0.9 %  sodium chloride infusion  500 mL Intravenous Once [name], MD         No Known Allergies  Physical exam:  Today's Vitals   11/20/21 1056  BP: 129/70  Pulse: 60  Weight: 187 lb 12.8 oz (85.2 kg)  Height: '5\' 5"'$  (1.651 m)   Body mass index is 31.25 kg/m.   Wt Readings from Last 3 Encounters:  11/20/21 187 lb 12.8 oz (85.2 kg)  10/04/21 184 lb (83.5 kg)  09/20/21 185 lb 4 oz (84 kg)     Ht Readings from Last 3 Encounters:  11/20/21 '5\' 5"'$  (1.651 m)  10/04/21 '5\' 5"'$  (1.651 m)  09/20/21 '5\' 5"'$  (1.651 m)      General: The patient is awake, alert and appears not in acute distress. The patient is well groomed. Head: Normocephalic, atraumatic. Neck is supple.  Mallampati ,  neck circumference:16. 125 inches . Nasal airflow not fully patent.  Retrognathia is  seen.  Dental status: full set of dentures.  Cardiovascular:  Regular rate and cardiac rhythm by pulse,  without distended neck veins. Respiratory: Lungs are clear  to auscultation.  Skin:  Without evidence of ankle edema, or rash. Trunk: The patient's posture is erect.   Neurologic exam : The patient is awake and alert, oriented to place and time.   Memory subjective described as impaired,      No data to display           Attention span & concentration ability appears reduced.  Speech is not  fluent,  with dysphonia or aphasia.  Mood and affect are appropriate.   Cranial nerves: no loss of smell or taste reported  Pupils are equal and briskly reactive to light. Funduscopic exam deferred.  Extraocular movements in vertical and horizontal planes were intact and without nystagmus. No Diplopia. Visual fields by finger perimetry are intact. Hearing was severely impaired.   Facial sensation intact to fine touch.  Facial motor strength is symmetric and tongue and uvula move midline.  Neck ROM : rotation, tilt and flexion extension were normal for age and shoulder shrug was symmetrical.    Motor exam:  Symmetric bulk, tone and ROM.   Normal tone without cog - wheeling, symmetric grip  strength .   Sensory:  deferred-    Coordination: Rapid alternating movements in the fingers/hands were of reduced speed.  The Finger-to-nose maneuver was performed excruciatingly slow without evidence of ataxia, dysmetria or tremor.   Gait and station: Patient could rise slowly and unassisted from a seated position, walked without assistive device. Stooped. Sciatica on the left.  Deep tendon reflexes: in the  upper and lower extremities are symmetric and intact.  Babinski response was deferred .    After spending a total time of  60  minutes face to face and additional time for physical and neurologic examination, review of laboratory studies,  personal review of imaging studies, reports and results of other testing and review of referral information / records as far as provided in visit, I have established the following assessments:  1) OSA ? this patient who is new to me has a history of COPD with asthma, a 30-year history of smoking, history of moderate sleep apnea in form of hypopnea documented at [facility] long sleep center in October 2015.  What I did not find is if she ever was started on CPAP, what kind of recommendations followed the diagnosis in 2015.  The patient had 1 stroke in 2014 for which she had been followed last by Dr. Rigoberto Noel and Charlott Holler, she has not been an active patient here for sleep ever.  2) What she describes today indicates the presence of REM sleep behavior disorder related to dream enactment, the character of the dreams which is mostly unpleasant threatening and causes a fearful reaction of defense, her son's description that she is yelling or saying things in her sleep but she is not sleepwalking.   3) She denies any visual hallucinations or misperceptions, and she describes also that the dreams she suffers are often repeatedly present through the night.  Her physical exam showed no evidence of parkinsonism.    She has received previously a diagnosis of mild cognitive  impairment but since she did not bring her functioning hearing aids with her today I do not think we can do a valid memory test here.  However a memory disorder has not been differentiated in the past such as vascular dementia, memory loss leading to an Alzheimer type memory loss or lewy body dementia.   4) she laments Insomnia, too-  poor sleep hygiene, floating rise time, staying  many more hours in bed than needed for sleep- this may have been a decade long problems. The covid pandemic affected all her social network activities and she has missed those badly-  DEPRESSION>    My Plan is to proceed with:  1) screening by HST for apnea, but we would need a PSG for parasomnia montage.I ordered a REM BD montage PSG. She may have apnea , but the diagnosis can be made by the same test.  2) Please refer for audiology work up and hearing aids- this will allow MOCA/ MMSE testing to be valid in the future.  3) RV in 2-4 months  Start melatonin at 5 mg at 10 PM.  we will consider dementia work up if indicated after MOCA/ MMSE   I would like to thank [name], [place] and [name], [place],  [place] [id number] for allowing me to meet with and to take care of this pleasant patient.   In short, Donna Gilmore is presenting with insomnia, fragmented sleep , untreated sleep hypopnea and CPD. REM BD, MCI.  With NP within 2-4 months for sleep only .   CC: I will share my notes with PCP/ .  Electronically signed by: Larey Seat, MD 11/20/2021 11:28 AM  [facility] Board certified by The [employer] of Sleep Medicine and Diplomate of the [employer] of Sleep Medicine. Board certified In Neurology through the [place], Fellow of the [employer] of Neurology. Medical Director of [facility].

## 2021-11-22 ENCOUNTER — Other Ambulatory Visit: Payer: Self-pay | Admitting: Nurse Practitioner

## 2021-11-26 DIAGNOSIS — N1832 Chronic kidney disease, stage 3b: Secondary | ICD-10-CM | POA: Diagnosis not present

## 2021-12-03 DIAGNOSIS — E1122 Type 2 diabetes mellitus with diabetic chronic kidney disease: Secondary | ICD-10-CM | POA: Diagnosis not present

## 2021-12-03 DIAGNOSIS — I129 Hypertensive chronic kidney disease with stage 1 through stage 4 chronic kidney disease, or unspecified chronic kidney disease: Secondary | ICD-10-CM | POA: Diagnosis not present

## 2021-12-03 DIAGNOSIS — N1832 Chronic kidney disease, stage 3b: Secondary | ICD-10-CM | POA: Diagnosis not present

## 2021-12-03 DIAGNOSIS — D631 Anemia in chronic kidney disease: Secondary | ICD-10-CM | POA: Diagnosis not present

## 2021-12-04 ENCOUNTER — Telehealth: Payer: Self-pay | Admitting: Neurology

## 2021-12-04 NOTE — Telephone Encounter (Signed)
NPSG- [employer] medicare no auth req.  Patient is scheduled at [facility] for 12/11/21 at 8 9pm.  Emailed patient the information.

## 2021-12-11 ENCOUNTER — Ambulatory Visit (INDEPENDENT_AMBULATORY_CARE_PROVIDER_SITE_OTHER): Payer: Medicare Other | Admitting: Neurology

## 2021-12-11 DIAGNOSIS — G4752 REM sleep behavior disorder: Secondary | ICD-10-CM

## 2021-12-11 DIAGNOSIS — G4721 Circadian rhythm sleep disorder, delayed sleep phase type: Secondary | ICD-10-CM

## 2021-12-11 DIAGNOSIS — G4733 Obstructive sleep apnea (adult) (pediatric): Secondary | ICD-10-CM | POA: Diagnosis not present

## 2021-12-11 DIAGNOSIS — R351 Nocturia: Secondary | ICD-10-CM

## 2021-12-11 DIAGNOSIS — J449 Chronic obstructive pulmonary disease, unspecified: Secondary | ICD-10-CM

## 2021-12-12 ENCOUNTER — Telehealth: Payer: Self-pay

## 2021-12-12 NOTE — Progress Notes (Addendum)
12-12-2021: Was informed that patient is out of [place]. Contacted patient and she doesn't know where she gets medication from. Contacted [facility] and was told medication hasn't been filled since 10-21-2019. Contacted [employer] and was told patient was approved but hasn't filled since 10-2020. A prescription is needed to fill medication and 7915 application is needed. Sent message to [place] to send prescription to [facility] and if samples are available. Patient has been out of medication for about a week. 2024 Re enrollment application completed and uploaded to mail.  12-19-2021: patient aware to pick up breztri sample and rx is needed fromoffice to recived more breztri from [employer]. Patient also is aware to fill out 2024 re enrollment paperwork and drop back off once Social security statements are mailed out.  [place] Pharmacist Assistant [phone]

## 2021-12-13 ENCOUNTER — Encounter: Payer: Self-pay | Admitting: Nurse Practitioner

## 2021-12-14 ENCOUNTER — Telehealth: Payer: Self-pay

## 2021-12-14 ENCOUNTER — Other Ambulatory Visit: Payer: Self-pay

## 2021-12-14 DIAGNOSIS — Z8709 Personal history of other diseases of the respiratory system: Secondary | ICD-10-CM

## 2021-12-14 MED ORDER — BREZTRI AEROSPHERE 160-9-4.8 MCG/ACT IN AERO: INHALATION_SPRAY | RESPIRATORY_TRACT | 2 refills | Status: DC

## 2021-12-14 NOTE — Telephone Encounter (Signed)
Call to pt reference starting PREP on 12/25/21 T/TH 230p-345p.  Can do that schedule at [place] scheduled for 12/18/21 at 4pm

## 2021-12-18 NOTE — Progress Notes (Signed)
[facility] PREP Evaluation  Patient Details  Name: Donna Gilmore MRN: [id number] Date of Birth: 1940/10/10 Age: 81 y.o. PCP: [name], FNP  Vitals:   12/18/21 1643  BP: 132/80  Pulse: 67  SpO2: 93%  Weight: 188 lb 12.8 oz (85.6 kg)     [facility] Eval - 12/18/21 1600       Referral    Referring Provider Laurance Flatten    Reason for referral Inactivity;Hypertension    Program Start Date 12/25/21   T/TH 230p-345p x 12wks     Measurement   Waist Circumference 42.5 inches    Hip Circumference 45 inches    Body fat --   not calculated     Information for Trainer   Goals get back to exercise, be able to walk a mile and get energy    Current Exercise none    Orthopedic Concerns LBP    Pertinent Medical History HTN, OSA, BPPPV, stroke hx, arthritis    Current Barriers none    Medications that affect exercise Medication causing dizziness/drowsiness      Timed Up and Go (TUGS)   Timed Up and Go Moderate risk 10-12 seconds      Mobility and Daily Activities   I find it easy to walk up or down two or more flights of stairs. 1    I have no trouble taking out the trash. 4    I do housework such as vacuuming and dusting on my own without difficulty. 2    I can easily lift a gallon of milk (8lbs). 4    I can easily walk a mile. 1    I have no trouble reaching into high cupboards or reaching down to pick up something from the floor. 1    I do not have trouble doing out-door work such as Armed forces logistics/support/administrative officer, raking leaves, or gardening. 1      Mobility and Daily Activities   I feel younger than my age. 1    I feel independent. 3    I feel energetic. 1    I live an active life.  1    I feel strong. 4    I feel healthy. 1    I feel active as other people my age. 4      How fit and strong are you.   Fit and Strong Total Score 29            Past Medical History:  Diagnosis Date   Benign paroxysmal positional vertigo 08/26/2012   CVA (cerebral infarction) 08/25/2012   Diabetes mellitus  without complication ([place])    Dyslipidemia 08/25/2012   Essential hypertension, benign 08/25/2012   Hyperlipidemia    Hypertension    Meralgia paraesthetica, left 01/12/2018   Stroke ([place])    No past surgical history on file. Social History   Tobacco Use  Smoking Status Former   Packs/day: 0.30   Years: 30.00   Total pack years: 9.00   Types: Cigarettes   Quit date: 2019   Years since quitting: 4.8  Smokeless Tobacco Never    Barnett Hatter 12/18/2021, 4:48 PM

## 2021-12-19 ENCOUNTER — Telehealth: Payer: Self-pay

## 2021-12-19 NOTE — Telephone Encounter (Signed)
Spoke patient, she will come by the office on Tuesday and I will help her with paperwork.   Pattricia Boss, [place] Pharmacist Assistant [phone]

## 2021-12-19 NOTE — Chronic Care Management (AMB) (Signed)
Called patient to assist with patient assistance paperwork. Patient wanted to make sure this paperwork was legit, she was a little skeptical about fill out and attaching income information. Assured patient that this application if from her PCP office and should go back to her PCP office so we can assist her getting her medication at no cost. Patient will come to the office Tuesday afternoon so I can help her with application.   Pattricia Boss, [place] Pharmacist Assistant [phone]

## 2021-12-22 NOTE — Procedures (Signed)
[facility] POLYSOMNOGRAPHY  INTERPRETATION REPORT   STUDY DATE:  12/11/2021     PATIENT NAME:  Donna Gilmore         DATE OF BIRTH:  1940/12/09  PATIENT ID:  [id number]    TYPE OF STUDY:  RBD  READING PHYSICIAN: Larey Seat, MD REFERRED BY: [name], NP  SCORING TECHNICIAN: Richard Miu, RPSGT   HISTORY: This 81 year-old female patient was a stroke patient of Dr Clydene Fake and Dr Erlinda Hong in 2014, is a pulmonary patient as recent as 2022 with Dr Annamaria Boots. She presented alone on 11-20-2021, is almost deaf and was not able to give much sleep history time frame. Vivid dreams, daytime sleepiness and excessive fatigue were reported.  Dreaming all night long, often terrible dreams.  ADDITIONAL INFORMATION:  The Epworth Sleepiness Scale was endorsed at 7 /24 points (scores above or equal to 10 are suggestive of hypersomnolence) and the FSS endorsed at  63 /63 points, and a geriatric depression score endorsed at 8/ 15  points ( POSITIVE) .  Height: 65 in Weight: 187 lb (BMI 31) Neck Size: 16 in  MEDICATIONS: Tylenol, Ventolin, Lipitor, Dymista, Breztri Aerosphere, Vitamin D3, Plavix, Flexeril, Valium, Voltaren, Aricept, Zetia, Fluorometholone, Neurontin, Hydrodiuril, Xyzal, Antivert, Benicar, Elidel, Probiotic, Rybelsus, Vitamin C, Vitamin D TECHNICAL DESCRIPTION: A registered sleep technologist ( RPSGT)  was in attendance for the duration of the recording.  Data collection, scoring, video monitoring, and reporting were performed in compliance with the AASM Manual for the Scoring of Sleep and Associated Events; (Hypopnea is scored based on the criteria listed in Section VIII D. 1b in the AASM Manual V2.6 using a 4% oxygen desaturation rule or Hypopnea is scored based on the criteria listed in Section VIII D. 1a in the AASM Manual V2.6 using 3% oxygen desaturation and /or arousal rule).   SLEEP CONTINUITY AND SLEEP ARCHITECTURE:  Lights-out was at 20:50: and lights-on  at  05:00:, with  8.2 minutes of recording time . Total sleep time (TST) was 339.0 minutes with a decreased sleep efficiency at 69.1%.  Sleep latency was increased at 47.5 minutes.  REM sleep latency was increased at 92.5 minutes. Of the total sleep time, the percentage of stage N1 sleep was 9.3%, stage N2 sleep was 64%, stage N3 sleep was 4.9%, and REM sleep was 21.7%. Wake after sleep onset (WASO) time accounted for 103 minutes.  BODY POSITION:  TST was divided between the following sleep positions: Sleep time in supine 144 minutes (43%), non-supine 195 minutes (57%); right 194 minutes (57%), left 00 minutes (0%), and prone 00 minutes (0%). Total supine REM sleep time was 34 minutes (46% of total REM sleep). There were 3 Stage R periods observed on this study night, 51 awakenings (i.e. transitions to Stage W from any sleep stage), and 139 total stage transitions.   RESPIRATORY MONITORING:   Based on CMS criteria (using a 4% oxygen desaturation rule for scoring hypopneas), there were 1 apnea (1 obstructive; 0 central; 0 mixed), and 47 hypopneas.  Apnea index was 0.2/h. Hypopnea index was 8.3/h.  The AHI (apnea-hypopnea index) was 8.5/h overall (AHI was 10.8/h supine, 11/h non-supine; 11.4 REM, 12.4 supine REM).There were 0 respiratory effort-related arousals (RERAs).    OXIMETRY: Oxyhemoglobin Saturation Nadir during sleep was at 69%, down from a mean of 98% saturation.  Of the Total sleep time (TST) hypoxemia (<89%) was present for 0.4 minutes, or 0.1% of total sleep time.  LIMB MOVEMENTS: There were  0 periodic limb movements of sleep (0.0/h), of which 0 (0.0/h) were associated with an arousal. There were many non-periodic movements. AROUSAL: There were 59 arousals in total, for an arousal index of 10 arousals/hour.  Of these, 21 were identified as respiratory-related arousals (4 /h), 0 were PLM-related arousals (0 /h), and 69 were non-specific arousals (12 /h).There were 0 occurrences of Cheyne Stokes  breathing.  Snoring was classified as mild during REM sleep. EEG:  PSG EEG showed many microarousals, unrelated to limb movements, snoring, bruxism or apnea.  EKG: The electrocardiogram documented NSR.  The average heart rate during sleep was 65 bpm.  The heart rate during sleep varied between a minimum of  52 bpm and  a maximum of  82 bpm. The fasted heart rate in wakefulness was 161 bpm.  AUDIO and VIDEO: Some unspecific phonation - grunting?- were captured in NREM sleep, but there was sleep talking /somniloquy following arousals out of REM sleep- Most arousals were micro arousals. No complex movements, no PLMs in REM sleep were recorded.    IMPRESSION:  Sleep disordered breathing was present to a mild degree, consistent of hypopneas, with an AHI of 8.5/h and associated with very brief oxygen desaturation.  Sleep talking was captured at 1 AM. A high degree of Sleep fragmentation was noted- The majority of sleep arousals were unrelated to physiological factors and qualified as spontaneous or unspecific. Total sleep time was reduced at 339.0 minutes.  Sleep efficiency was decreased at 69.1%.      RECOMMENDATIONS: 1) I would offer this patient a PAP therapy trial but foresee compliance difficulties in a patient living alone at her age with hearing loss. This can be offered for mild apnea treatment.  I doubt that use of an auto- CPAP device would improve sleep quality significantly.  3)The second recommendation is in relation to her reported vivid dreams and would include use of an SSRI to delay and decrease REM sleep. I also recommend melatonin at 5 mg or less nightly. I do not recommend benzodiazepines in this patient with an underlying pulmonary disorder.  Larey Seat, MD               Name:  Donna Gilmore, Donna Gilmore BMI:  31.12 Physician:  ,   ID:  [id number] Height:  65.0 in Technician:  Richard Miu  Sex:  Female Weight:  187.0 lb Record:  [id number]  Age:  75  [[birth year]] Date:  12/11/2021 Scorer:  Donna Gilmore & Medication History      Donna Gilmore is a 81 y.o. year old 46 or [place] American female patient seen here as a referral on 11/20/2021 from PCP. This referral is for a sleep evaluation this patient has been followed by dr Erlinda Hong until 2018 with vascular dementia, mild cognitive impairment. Dx in 2018. She lives with her oldest son of 3 sons, has one daughter. Chief concern according to patient : " History of sleep apnea, vivid dreams, chronic fatigue". I have the pleasure of seeing Donna Gilmore today, a right-handed [place] or [place] American female with a possible sleep disorder. Donna Gilmore was accompanied by no one. she is very hard of hearing and the interview process is prolonged and difficult. Mrs. Donna Gilmore is a meanwhile 81 year old lady who reports that she has frequent terrible dreams. These wake her up she feels threatened and very upset by the content of her dreams in her dreams she has to fight off intruders or people that follow her  it is a more night marriage content. She wakes up but normally not out of dreams. Her son stated to her that she yells, and kicks and boxes. She has not left the bed, has not fallen out of bed. She described that the dreams are all night long, yet this is not verified. The patient had the first sleep study in the year 2015 with a result of an AHI ( Apnea Hypopnea index) of 20, these were all hypopneic events, there were few frank apnea hours and of those centrals outnumber obstructive events Tylenol, Ventolin, Lipitor, Dymista, Breztri Aerosphere, Vitamin D3, Plavix, Flexeril, Valium, Voltaren, Aricept, Zetia, Fluorometholone, Neurontin, Hydrodiuril, Xyzal, Antivert, Benicar, Elidel, Probiotic, Rybelsus, Vitamin C, Vitamin D  Sleep Disorder     Comments   The patient came into the sleep lab for a PSG/RBD. Per patient she took all medication prior to arrival at the sleep lab. She  had one restroom trip. EKG kept in NSR with occasional PVC's. No movement during REM noted. The patient would arousal out of REM and then start mumbling. The mumbling never happened while the patient was actually asleep. All sleep stages witnessed. Mild snoring when in REM. She slept supine and lateral. Leg movements noted. All respiratory events scored with a 4% desat. Bio-Cals were not completed due to the patient having a very hard time hearing even with hearing aids in.    Lights out:  08:50:02 PM Lights on:  05:00:14 AM   Time  Total  Supine  Side  Prone  Upright   Recording (TRT)  8h 10.32m 2h 49.574m5h 21.52m752mh 0.52m 31m 0.52m  74meep (TST)  5h 39.52m  22m4.67m  3h52m.67m  0h 18mm  0h 033m    Lat4my  N1  N2  N3  REM  Onset  Per. Slp.  Eff.   Actual  0h 47.67m  1h 11.28m 3h 3.67m32mh 32.67m38mh 47.67m 67m 56.52m  667m11%     39m Dur  Wake  N1  N2  N3  REM   Total 102.0  31.5  217.5  16.5  73.5   Supine 23.0  7.5  103.0  0.0  34.0   Side 79.0  24.0  114.5  16.5  39.5   Prone 0.0  0.0  0.0  0.0  0.0   Upright 0.0  0.0  0.0  0.0  0.0    Stg %  Wake  N1  N2  N3  REM   Total 23.1  9.3  64.2  4.9  21.7   Supine 5.2  2.2  30.4  0.0  10.0   Side 17.9  7.1  33.8  4.9  11.7   Prone 0.0  0.0  0.0  0.0  0.0   Upright 0.0  0.0  0.0  0.0  0.0      Apnea Summary Sub  Supine  Side  Prone  Upright   Total  1  Total 1  1  0  0  0   REM 0  0  0  0  0   NREM 1  1  0  0  0   Obs  1  REM 0  0  0  0  0   NREM 1  1  0  0  0   Mix  0  REM 0  0  0  0  0   NREM 0  0  0  0  0   Cen  0  REM 0  0  0  0  0   NREM 0  0  0  0  0    Rera Summary Sub  Supine  Side  Prone  Upright   Total  0  Total 0  0  0  0  0   REM 0  0  0  0  0   NREM 0  0  0  0  0     Hypopnea Summary Sub  Supine  Side  Prone  Upright   Total  47  Total 47   25  22  0  0   REM '14  7  7  '$ 0  0   NREM 33  18  15  0  0   4% Hypopnea Summary Sub  Supine  Side  Prone  Upright   Total (4%)  47  Total 47  25  22  0  0   REM '14  7  7  '$ 0  0   NREM 33  18  15  0  0        --------------------------------------------------------------------------------  AHI  Total  Obs  Mix  Cen   8.50  Apnea 0.18  0.18  0.00  0.00   Hypopnea 8.32  --  --  --   8.50  Hypopnea (4%) 8.32  --  --  --      Total  Supine  Side  Prone  Upright   Position AHI  8.50  10.80  6.79  0.00  0.00   REM AHI 11.43    NREM AHI 7.68   Position RDI  8.50  10.80  6.79  0.00  0.00   REM RDI 11.43    NREM RDI 7.68     4% Hypopnea  Total  Supine  Side  Prone  Upright   Position AHI (4%)  8.50  10.80  6.79  0.00  0.00   REM AHI (4%) 11.43    NREM AHI (4%) 7.68   Position RDI (4%)  8.50  10.80  6.79  0.00  0.00   REM RDI (4%) 11.43    NREM RDI (4%) 7.68     Desaturation Information  Threshold: 2%  <100%  <90%  <80%  <70%  <60%  <50%  <40%   Supine  97.0  0.0  0.0  0.0  0.0  0.0  0.0   Side  128.0  6.0  2.0  0.0  0.0  0.0  0.0   Prone  0.0  0.0  0.0  0.0  0.0  0.0  0.0   Upright  0.0  0.0  0.0  0.0  0.0  0.0  0.0   Total  225.0  6.0  2.0  0.0  0.0  0.0  0.0   Index 30.6  0.8  0.3  0.0  0.0  0.0  0.0     Threshold: 3%  <100%  <90%  <80%  <70%  <60%  <50%  <40%   Supine  29.0  0.0  0.0  0.0  0.0  0.0  0.0   Side  36.0  6.0  2.0  0.0  0.0  0.0  0.0   Prone  0.0  0.0  0.0  0.0  0.0  0.0  0.0   Upright  0.0  0.0  0.0  0.0  0.0  0.0  0.0   Total  65.0  6.0  2.0  0.0  0.0  0.0  0.0   Index 8.8  0.8  0.3  0.0  0.0  0.0  0.0     Threshold: 4%  <100%  <90%  <80%  <70%  <60%  <50%  <40%   Supine  27.0  0.0  0.0  0.0  0.0  0.0  0.0   Side  31.0  6.0   2.0  0.0  0.0  0.0  0.0   Prone  0.0  0.0  0.0  0.0  0.0  0.0  0.0   Upright  0.0  0.0  0.0  0.0  0.0  0.0  0.0   Total  58.0  6.0  2.0  0.0  0.0  0.0  0.0   Index 7.9  0.8  0.3  0.0  0.0  0.0  0.0     Threshold: 4% <100%  <90%  <80%  <70%  <60%  <50%  <40%   Supine 27  0  0  0  0  0  0   Side '31  6  2  '$ 0  0  0  0   Prone 0  0  0  0  0  0  0   Upright 0  0  0  0  0  0  0   Total 58  6  2  0  0  0  0     Awakening/Arousal Information  # of Awakenings 51   Wake after sleep onset 103.5m  Wake after persistent sleep 72.526m Arousal Assoc. Arousals  Index   Apneas 0  0.0   Hypopneas 21  3.7   Leg Movements 0  0.0   Snore 0  0.0   PTT Arousals 0  0.0   Spontaneous 69  12.2   Total 90  15.9    Leg Movement Information  PLMS LMs  Index   Total LMs during PLMS 0  0.0   LMs w/ Microarousals 0  0.0    LM LMs  Index   w/ Microarousal 0  0.0   w/ Awakening 1  0.2   w/ Resp Event 0  0.0   Spontaneous 15  2.7   Total 15  2.7         --------------------------------------------------------------------------------   Desaturation threshold setting: 4% Minimum desaturation setting: 10 seconds SaO2 nadir: 59%  The longest event was a 32 sec obstructive Hypopnea with a minimum SaO2 of 93%.  The lowest SaO2 was 85% associated with a 19 sec obstructive Hypopnea.  EKG Rates  EKG Avg  Max  Min   Awake 71  95  55   Asleep 65  82  52    EKG Events: Tachycardia

## 2021-12-22 NOTE — Progress Notes (Signed)
Recording time was 8.2 hours, not minutes.   Vivid dreams reported, sleep talking in REM sleep confirmed, but no REM PLMs.  Very fragmented sleep, and mild sleep hypopnea.   I have been reluctant to recommend CPAP for reasons I have described in my report.  I rather prefer for PCP to address the vivid dream issue with recommendation for an SSRI ( lexapro/ zoloft) and melatonin less than 5 mg nightly to start.   There is a lot of sleep hygiene improvement to be addressed. For cognitive behavior therapy to be successful , it would require treating patient's hearing loss.    COPD is treated by Dr Annamaria Boots.

## 2021-12-23 ENCOUNTER — Encounter (HOSPITAL_COMMUNITY): Payer: Self-pay | Admitting: Emergency Medicine

## 2021-12-23 ENCOUNTER — Ambulatory Visit (HOSPITAL_COMMUNITY)
Admission: EM | Admit: 2021-12-23 | Discharge: 2021-12-23 | Disposition: A | Discharge status: Home / Self Care | Payer: Medicare Other | Attending: Internal Medicine | Admitting: Internal Medicine

## 2021-12-23 DIAGNOSIS — J3089 Other allergic rhinitis: Secondary | ICD-10-CM

## 2021-12-23 MED ORDER — OXYMETAZOLINE HCL 0.05 % NA SOLN: 1.0000 | Freq: Two times a day (BID) | NASAL | 0 refills | Status: AC

## 2021-12-23 NOTE — ED Triage Notes (Signed)
Pt presents with family.   Family reports head congestion, nasal congestion and nose pain from continuously blowing her nose. Symptoms started this morning.

## 2021-12-23 NOTE — Discharge Instructions (Signed)
You were seen today for sinus pressure, runny nose and nasal congestion.  I want you to hold your Dymista nasal spray and use Afrin twice daily for the next 3 days.  You are also taking Xyzal 5 mg daily.  I want you to take this 2 times daily for the next week.  Please follow-up with your PCP if symptoms persist or worsen.

## 2021-12-23 NOTE — ED Provider Notes (Signed)
[facility]    CSN: [id number] Arrival date & time: 12/23/21  1639      History   Chief Complaint Chief Complaint  Patient presents with   Nasal Congestion   Sinus Pressure    HPI Donna Gilmore is a 81 y.o. female with history of COPD, DM 2, HTN, HLD status post stroke presents to UC today with complaint of runny nose, nasal congestion and sinus pressure.  She reports her symptoms started this morning.  She denies headache, ear pain, sore throat, cough,, shortness of breath, nausea, vomiting or diarrhea.  She denies fever, chills.  She has taken her Dymista and Xyzal OTC with minimal relief of symptoms.  She has had sick contacts with similar symptoms but not been around anybody has been diagnosed with COVID or the flu.  HPI  Past Medical History:  Diagnosis Date   Benign paroxysmal positional vertigo 08/26/2012   CVA (cerebral infarction) 08/25/2012   Diabetes mellitus without complication ([place])    Dyslipidemia 08/25/2012   Essential hypertension, benign 08/25/2012   Hyperlipidemia    Hypertension    Meralgia paraesthetica, left 01/12/2018   Stroke [facility])     Patient Active Problem List   Diagnosis Date Noted   Nocturia more than twice per night 11/20/2021   Night-waking disorder, delayed sleep phase type 11/20/2021   REM sleep behavior disorder 11/20/2021   Chronic obstructive pulmonary disease ([place]) 11/20/2021   Moderate obstructive sleep apnea-hypopnea syndrome 11/20/2021   Chronic rhinitis 09/04/2020   COPD with asthma 02/25/2019   Peripheral vascular disease ([place]) 02/25/2019   Pain in left foot 08/21/2018   Chronic bilateral low back pain without sciatica 06/01/2018   Class 1 obesity due to excess calories without serious comorbidity with body mass index (BMI) of 32.0 to 32.9 in adult 06/01/2018   Acute left-sided low back pain with left-sided sciatica 05/04/2018   Shortness of breath 05/04/2018   Tinnitus, bilateral 03/18/2018   Meralgia  paraesthetica, left 01/12/2018   Parotid mass 01/10/2017   Sensorineural hearing loss (SNHL) of both ears 01/10/2017   Mild cognitive impairment 10/14/2016   History of stroke 10/14/2016   History of syphilis 10/14/2016   Mixed hyperlipidemia 10/14/2016   Insomnia 10/14/2016   Cerebellar stroke syndrome 10/23/2013   Smoker 02/16/2013   Chest pain 09/21/2012   Tobacco abuse disorder 09/21/2012   Benign paroxysmal positional vertigo 08/26/2012   CVA (cerebral infarction) 08/25/2012   Essential hypertension 08/25/2012   Dyslipidemia 08/25/2012    History reviewed. No pertinent surgical history.  OB History   No obstetric history on file.      Home Medications    Prior to Admission medications   Medication Sig Start Date End Date Taking? Authorizing Provider  oxymetazoline (AFRIN NASAL SPRAY) 0.05 % nasal spray Place 1 spray into both nostrils 2 (two) times daily for 3 days. 12/23/21 12/26/21 Yes Deryl Giroux, Coralie Keens, NP  acetaminophen (TYLENOL) 500 MG tablet Take 1 tablet (500 mg total) by mouth every 6 (six) hours as needed. 08/13/18   Lamptey, Myrene Galas, MD  albuterol (VENTOLIN HFA) 108 (90 Base) MCG/ACT inhaler Inhale 2 puffs into the lungs every 6 (six) hours as needed for wheezing or shortness of breath. 07/04/20   Rigoberto Noel, MD  atorvastatin (LIPITOR) 80 MG tablet TAKE 1 TABLET BY MOUTH ONCE DAILY 09/03/21   [name], FNP  Azelastine-Fluticasone [facility] 137-50 MCG/ACT SUSP Place 1 spray into the nose in the morning and at bedtime. 04/18/21  [name], MD  Budeson-Glycopyrrol-Formoterol (BREZTRI AEROSPHERE) 160-9-4.8 MCG/ACT AERO USE 2 INHALATIONS BY MOUTH  DAILY 12/14/21   [name], FNP  cholecalciferol (VITAMIN D3) 25 MCG (1000 UNIT) tablet Take 1,000 Units by mouth daily.    [provider]  clopidogrel (PLAVIX) 75 MG tablet TAKE 1 TABLET BY MOUTH DAILY 11/23/21   [name], FNP  cyclobenzaprine (FLEXERIL) 5 MG tablet     [provider]  diazepam (VALIUM) 5 MG tablet Take 5 mg by mouth as needed for anxiety.    [provider]  diclofenac Sodium (VOLTAREN) 1 % GEL Apply topically 4 (four) times daily.    [provider]  donepezil (ARICEPT) 5 MG tablet TAKE 1 TABLET BY MOUTH  DAILY 08/24/21   [name], FNP  ezetimibe (ZETIA) 10 MG tablet Take 1 tablet (10 mg total) by mouth daily. 09/07/21 09/02/22  [name], MD  fluorometholone (FML) 0.1 % ophthalmic suspension 1 drop 4 (four) times daily. 08/02/21   [provider]  gabapentin (NEURONTIN) 100 MG capsule TAKE 1 CAPSULE BY MOUTH 3  TIMES DAILY AS NEEDED 07/16/21   [name], FNP  glucose blood (ONETOUCH ULTRA) test strip USE WITH METER TO CHECK BLOOD SUGAR BEFORE BREAKFAST AND BEFORE DINNER 10/22/21   [name], FNP  hydrochlorothiazide (HYDRODIURIL) 12.5 MG tablet     [provider]  levocetirizine (XYZAL) 5 MG tablet Take 1 tablet (5 mg total) by mouth every evening. 12/28/19   [name], FNP  meclizine (ANTIVERT) 25 MG tablet TAKE 1 TABLET BY MOUTH 2  TIMES EVERY DAY AS NEEDED 06/04/21   [name], FNP  Melatonin 5 MG CAPS Take 1 capsule (5 mg total) by mouth daily after supper. 11/20/21   Dohmeier, Asencion Partridge, MD  olmesartan (BENICAR) 40 MG tablet     [provider]  olmesartan-hydrochlorothiazide (BENICAR HCT) 40-12.5 MG tablet TAKE 1 TABLET BY MOUTH DAILY 09/12/21   [name], FNP  pimecrolimus (ELIDEL) 1 % cream Apply thin layer to the skin as needed twice a day for itching or rash 09/20/21   [name], MD  Probiotic Product (PROBIOTIC-10 PO) Take by mouth. Take one tablet daily    [provider]  Semaglutide (RYBELSUS) 7 MG TABS Take 1 tablet by mouth daily. 06/12/21   [name], FNP  vitamin C (ASCORBIC ACID) 500 MG tablet Take 500 mg by mouth daily.    [provider]  VITAMIN D PO Take 1 tablet by mouth daily.    [provider]    Family  History Family History  Problem Relation Age of Onset   Kidney failure Mother    Diabetes Mother    Alzheimer's disease Mother    Alzheimer's disease Father    Prostate cancer Father    Diabetes Maternal Grandmother    Diabetes Maternal Grandfather    Alzheimer's disease Paternal Grandmother    Alzheimer's disease Paternal Grandfather    Prostate cancer Paternal Uncle     Social History Social History   Tobacco Use   Smoking status: Former    Packs/day: 0.30    Years: 30.00    Total pack years: 9.00    Types: Cigarettes    Quit date: 2019    Years since quitting: 4.9   Smokeless tobacco: Never  Vaping Use   Vaping Use: Never used  Substance Use Topics   Alcohol use: Not Currently    Comment: occasional   Drug use: No  Allergies   Patient has no known allergies.   Review of Systems Review of Systems  Constitutional:  Negative for appetite change, chills and fever.  HENT:  Positive for congestion, rhinorrhea, sinus pressure, sinus pain and sneezing. Negative for ear pain and sore throat.   Eyes:  Negative for pain, discharge, redness and itching.  Respiratory:  Negative for cough, chest tightness and shortness of breath.   Cardiovascular:  Negative for chest pain.  Gastrointestinal:  Negative for diarrhea, nausea and vomiting.  Skin:  Negative for rash.     Physical Exam Triage Vital Signs ED Triage Vitals  Enc Vitals Group     BP 12/23/21 1721 (!) 138/91     Pulse Rate 12/23/21 1721 72     Resp 12/23/21 1721 18     Temp 12/23/21 1721 97.6 F (36.4 C)     Temp Source 12/23/21 1721 Oral     SpO2 12/23/21 1721 100 %     Weight --      Height --      Head Circumference --      Peak Flow --      Pain Score 12/23/21 1719 10     Pain Loc --      Pain Edu? --      Excl. in Paint? --    No data found.  Updated Vital Signs BP (!) 138/91 (BP Location: Left Arm)   Pulse 72   Temp 97.6 F (36.4 C) (Oral)   Resp 18   SpO2 100%      Physical  Exam Constitutional:      Appearance: She is obese.  HENT:     Head: Normocephalic.     Nose: Rhinorrhea present. No congestion.     Mouth/Throat:     Mouth: Mucous membranes are moist.     Pharynx: No oropharyngeal exudate or posterior oropharyngeal erythema.  Eyes:     Conjunctiva/sclera: Conjunctivae normal.  Cardiovascular:     Rate and Rhythm: Normal rate and regular rhythm.  Pulmonary:     Effort: Pulmonary effort is normal.     Breath sounds: Normal breath sounds. No wheezing, rhonchi or rales.  Skin:    Findings: No rash.  Neurological:     Mental Status: She is alert. Mental status is at baseline.      UC Treatments / Results   Medications Ordered in UC Medications - No data to display  Initial Impression / Assessment and Plan / UC Course  I have reviewed the triage vital signs and the nursing notes.  Pertinent labs & imaging results that were available during my care of the patient were reviewed by me and considered in my medical decision making (see chart for details).     80 year old female with 1 day history of sinus pressure, runny nose and nasal congestion.  DDx include allergic rhinitis, viral sinusitis, COVID, influenza and RSV.  Given her lack of other symptoms, will not test for COVID or flu at this time.  Rx for Afrin nasal spray-use as directed for 3 days.  I will also have her increase her Xyzal to 5 mg twice daily .  She will follow-up with her PCP if symptoms persist or worsen.  Final Clinical Impressions(s) / UC Diagnoses   Final diagnoses:  Non-seasonal allergic rhinitis, unspecified trigger     Discharge Instructions      You were seen today for sinus pressure, runny nose and nasal congestion.  I want you to hold your  Dymista nasal spray and use Afrin twice daily for the next 3 days.  You are also taking Xyzal 5 mg daily.  I want you to take this 2 times daily for the next week.  Please follow-up with your PCP if symptoms persist or  worsen.     ED Prescriptions     Medication Sig Dispense Auth. Provider   oxymetazoline (AFRIN NASAL SPRAY) 0.05 % nasal spray Place 1 spray into both nostrils 2 (two) times daily for 3 days. 15 mL Jearld Fenton, NP      PDMP not reviewed this encounter.   Jearld Fenton, NP 12/23/21 1737

## 2021-12-24 ENCOUNTER — Telehealth: Payer: Self-pay

## 2021-12-24 NOTE — Telephone Encounter (Signed)
-----   Message from Larey Seat, MD sent at 12/22/2021  3:36 PM EST ----- Recording time was 8.2 hours, not minutes.   Vivid dreams reported, sleep talking in REM sleep confirmed, but no REM PLMs.  Very fragmented sleep, and mild sleep hypopnea.   I have been reluctant to recommend CPAP for reasons I have described in my report.  I rather prefer for PCP to address the vivid dream issue with recommendation for an SSRI ( lexapro/ zoloft) and melatonin less than 5 mg nightly to start.   There is a lot of sleep hygiene improvement to be addressed. For cognitive behavior therapy to be successful , it would require treating patient's hearing loss.    COPD is treated by Dr Annamaria Boots.

## 2021-12-24 NOTE — Telephone Encounter (Signed)
Patient returned my call.  I discussed her sleep study results and recommendations.  I advised her that Dr. Brett Fairy is reluctant to recommend CPAP.  However, she recommends that patient address her vivid dreams with her PCP.  There are medications that PCP may want to try with patient.  I discussed sleep hygiene recommendations with patient.  Patient will call her PCP to address her vivid dreams further.  Patient will follow-up with [place] as needed.  Patient verbalized understanding of results and recommendations.  Patient had no further questions or concerns at this time

## 2021-12-24 NOTE — Telephone Encounter (Signed)
I called patient to discuss.  No answer, no voicemail set up.  Will try again later.

## 2022-01-01 ENCOUNTER — Ambulatory Visit (INDEPENDENT_AMBULATORY_CARE_PROVIDER_SITE_OTHER): Payer: Medicare Other | Admitting: Podiatry

## 2022-01-01 ENCOUNTER — Telehealth: Payer: Self-pay

## 2022-01-01 VITALS — BP 117/62

## 2022-01-01 DIAGNOSIS — E1142 Type 2 diabetes mellitus with diabetic polyneuropathy: Secondary | ICD-10-CM | POA: Diagnosis not present

## 2022-01-01 DIAGNOSIS — M79675 Pain in left toe(s): Secondary | ICD-10-CM | POA: Diagnosis not present

## 2022-01-01 DIAGNOSIS — M79674 Pain in right toe(s): Secondary | ICD-10-CM | POA: Diagnosis not present

## 2022-01-01 DIAGNOSIS — B351 Tinea unguium: Secondary | ICD-10-CM

## 2022-01-01 NOTE — Chronic Care Management (AMB) (Signed)
Faxed 2024 re-enrollment application to [place] for [facility].   Pattricia Boss, [place] Pharmacist Assistant [phone]

## 2022-01-01 NOTE — Progress Notes (Signed)
  Subjective:  Patient ID: Donna Gilmore, female    DOB: 1940-09-18,  MRN: [id number]  Donna Gilmore presents to clinic today for at risk foot care with history of diabetic neuropathy and painful thick toenails that are difficult to trim. Pain interferes with ambulation. Aggravating factors include wearing enclosed shoe gear. Pain is relieved with periodic professional debridement.. Chief Complaint  Patient presents with   Nail Problem    DFC BS-[id number] A1C-6 Months ago Bard Herbert PCP VST-  PCP is [name], FNP.  No Known Allergies  Review of Systems: Negative except as noted in the HPI. Objective:   Vitals:   01/01/22 1052  BP: 117/62    Vascular Examination: CFT <3 seconds b/l LE. Palpable DP pulse(s) b/l LE. Faintly palpable PT pulse(s) b/l LE. Pedal hair absent. No pain with calf compression b/l. Lower extremity skin temperature gradient within normal limits. Trace edema noted BLE. No ischemia or gangrene noted b/l LE. No cyanosis or clubbing noted b/l LE.Marland Kitchen  Dermatological Examination: Pedal skin with normal turgor, texture and tone b/l. No open wounds. No interdigital macerations b/l.   Toenails 1-5  left, 2-5 right thickened, discolored, dystrophic with subungual debris. There is pain on palpation to dorsal aspect of nailplates.   Anonychia noted R hallux. Nailbed(s) epithelialized.   Neurological Examination: Protective sensation intact with 10 gram monofilament b/l LE. Vibratory sensation intact b/l LE. Pt has subjective symptoms of neuropathy.  Musculoskeletal Examination: Muscle strength 5/5 to all LE muscle groups b/l. No pain, crepitus or joint limitation noted with ROM bilateral LE. No gross bony deformities bilaterally. Radiographs: None  Last A1c:     Latest Ref Rng & Units 10/04/2021   11:59 AM 05/29/2021    2:23 PM 01/16/2021   12:47 PM  Hemoglobin A1C  Hemoglobin-A1c 4.8 - 5.6 % 7.3  7.2  6.8     Assessment:   1. Pain due to onychomycosis  of toenails of both feet   2. Type 2 diabetes mellitus with diabetic polyneuropathy, without long-term current use of insulin ([place])    Plan:  Patient was evaluated and treated and all questions answered. Consent given for treatment as described below: -Patient was evaluated and treated. All patient's and/or POA's questions/concerns answered on today's visit. -Continue diabetic foot care principles: inspect feet daily, monitor glucose as recommended by PCP and/or Endocrinologist, and follow prescribed diet per PCP, Endocrinologist and/or dietician. -Continue supportive shoe gear daily. -Mycotic toenails 2-5 bilaterally and L hallux were debrided in length and girth with sterile nail nippers and dremel without iatrogenic bleeding. -Patient/POA to call should there be question/concern in the interim.  Return in about 3 months (around 04/02/2022).  [name], DPM

## 2022-01-02 ENCOUNTER — Ambulatory Visit: Payer: Self-pay

## 2022-01-02 NOTE — Patient Outreach (Signed)
  Care Coordination   01/02/2022 Name: Donna Gilmore MRN: [id number] DOB: 03-18-1940   Care Coordination Outreach Attempts:  An unsuccessful telephone outreach was attempted today to offer the patient information about available care coordination services as a benefit of their health plan.   Follow Up Plan:  Additional outreach attempts will be made to offer the patient care coordination information and services.   Encounter Outcome:  Pt. Request to Call Back   Care Coordination Interventions:  No, not indicated    Barb Merino, RN, BSN, CCM Care Management Coordinator [place] [facility] Direct Phone: [phone]

## 2022-01-06 ENCOUNTER — Encounter: Payer: Self-pay | Admitting: Podiatry

## 2022-01-07 NOTE — Progress Notes (Signed)
[facility] PREP Weekly Session  Patient Details  Name: Donna Gilmore MRN: [id number] Date of Birth: 1940/02/27 Age: 81 y.o. PCP: [name], FNP  Vitals:   01/01/22 1430  Weight: 189 lb (85.7 kg)     [facility] Weekly seesion - 01/07/22 0900       [facility] "PREP" Location   [facility] "PREP" Location [facility]      Weekly Session   Topic Discussed Importance of resistance training;Other ways to be active    Minutes exercised this week --   not reported   Classes attended to date [place] 01/07/2022, 9:46 AM

## 2022-01-08 NOTE — Progress Notes (Signed)
[facility] PREP Weekly Session  Patient Details  Name: Donna Gilmore MRN: [id number] Date of Birth: 14-Nov-1940 Age: 81 y.o. PCP: [name], FNP  Vitals:   01/08/22 1430  Weight: 188 lb 3.2 oz (85.4 kg)     [facility] Weekly seesion - 01/08/22 1600       [facility] "PREP" Location   [facility] "PREP" Location [facility]      Weekly Session   Topic Discussed Healthy eating tips    Minutes exercised this week 30 minutes    Classes attended to date [place] 01/08/2022, 4:40 PM

## 2022-01-16 NOTE — Progress Notes (Signed)
[facility] PREP Weekly Session  Patient Details  Name: Donna Gilmore MRN: [id number] Date of Birth: 1940-11-01 Age: 81 y.o. PCP: [name], FNP  Vitals:   01/15/22 1430  Weight: 188 lb 3.2 oz (85.4 kg)     [facility] Weekly seesion - 01/16/22 1100       [facility] "PREP" Location   [facility] "PREP" Location [facility]      Weekly Session   Topic Discussed Health habits   sugar demo   Classes attended to date [place] 01/16/2022, 11:47 AM

## 2022-02-01 ENCOUNTER — Telehealth: Payer: Self-pay | Admitting: *Deleted

## 2022-02-01 NOTE — Progress Notes (Signed)
  Care Coordination Note  02/01/2022 Name: [name] MRN: [id number] DOB: 02-Oct-1940  Donna Gilmore is a 82 y.o. year old female who is a primary care patient of [name], [place] and is actively engaged with the care management team. I reached out to [employer] by phone today to assist with re-scheduling a follow up visit with the RN Case Manager  Follow up plan: Unsuccessful telephone outreach attempt made.   [place]  Direct Dial: [phone]

## 2022-02-01 NOTE — Progress Notes (Signed)
  Care Coordination Note  02/01/2022 Name: Donna Gilmore MRN: [id number] DOB: May 24, 1940  [name] is a 82 y.o. year old female who is a primary care patient of [name], [place] and is actively engaged with the care management team. I reached out to Alda Lea by phone today to assist with re-scheduling a follow up visit with the RN Case Manager  Follow up plan: Telephone appointment with care management team member scheduled for:02/14/22  [place]  Direct Dial: [phone]

## 2022-02-06 ENCOUNTER — Ambulatory Visit (INDEPENDENT_AMBULATORY_CARE_PROVIDER_SITE_OTHER): Payer: Medicare Other | Admitting: Allergy

## 2022-02-06 ENCOUNTER — Encounter: Payer: Self-pay | Admitting: Allergy

## 2022-02-06 VITALS — BP 130/78 | HR 60 | Temp 98.4°F | Resp 12

## 2022-02-06 DIAGNOSIS — L299 Pruritus, unspecified: Secondary | ICD-10-CM | POA: Diagnosis not present

## 2022-02-06 DIAGNOSIS — H01131 Eczematous dermatitis of right upper eyelid: Secondary | ICD-10-CM

## 2022-02-06 DIAGNOSIS — H01134 Eczematous dermatitis of left upper eyelid: Secondary | ICD-10-CM

## 2022-02-06 DIAGNOSIS — H01135 Eczematous dermatitis of left lower eyelid: Secondary | ICD-10-CM

## 2022-02-06 DIAGNOSIS — J3089 Other allergic rhinitis: Secondary | ICD-10-CM

## 2022-02-06 DIAGNOSIS — H01132 Eczematous dermatitis of right lower eyelid: Secondary | ICD-10-CM

## 2022-02-06 DIAGNOSIS — H1013 Acute atopic conjunctivitis, bilateral: Secondary | ICD-10-CM | POA: Diagnosis not present

## 2022-02-06 MED ORDER — AZELASTINE HCL 0.05 % OP SOLN: 1.0000 [drp] | Freq: Two times a day (BID) | OPHTHALMIC | 5 refills | Status: DC

## 2022-02-06 NOTE — Patient Instructions (Addendum)
-   Continue avoidance measures for dust mites and cockroach.  - Air purifiers help to purify the air you breathe in and reduce your allergen load in the home.  - stop Dymista nasal spray - try Atrovent nasal spray 2 sprays each nostril 2-3 times a day for nasal drainage or congestion - use Xyzal 1-2 tabs a day to help control allergy symptoms and itch - try Optivar eye drop 1 drop each eye twice a day as needed for itchy/watery eyes - can use small amount of vaseline on qtip to apply to the nose to help decrease dryness - for itching/rash of the eyelid and face use Elidel ointment apply thin layer to the skin as needed twice a day for itching or rash  Follow-up in 4-6 months or sooner if needed

## 2022-02-06 NOTE — Progress Notes (Signed)
Follow-up Note  RE: Donna Gilmore MRN: [id number] DOB: December 29, 1940 Date of Office Visit: 02/06/2022   History of present illness: Donna Gilmore is a 82 y.o. female presenting today for follow-up of allergic rhinitis and eyelid dermatitis.  She was last seen in the office on 09/20/2021 by myself.  She did present to UC on 12/23/21 for allergy symptoms of runny nose, nasal congestion and sinus pressure. She was recommended to use afrin nasal spray for 3 days and to take her xyzal twice a day for a short duration.    She has both runny nose and stuffy nose, itchy eyes and states can have terrible ear itch as well as scalp itch.  She does states her nose spray helps which is the Dymista and does in the morning and at night.  I do not get the sense that she has been using the Elidel ointment for itchy or rashes areas of the face.  She is taking Xyzal daily but does not feel like it is doing much for her symptoms.  She states she breathes hard and her family ask her why she breathes so heavy.  She has been prescribed Breztri inhaler previously although she was not sure what she was prescribed this.  She states she got a refill of Breztri 2 days ago when she is taking it 2 puffs twice a day and does not feel like her breathing is any different over the past 1.5 days of resumed use.    Review of systems: Review of Systems  Constitutional: Negative.   HENT:         See HPI  Eyes: Negative.   Respiratory:         See HPI  Cardiovascular: Negative.   Gastrointestinal: Negative.   Musculoskeletal: Negative.   Skin:        See HPI  Allergic/Immunologic: Negative.   Neurological: Negative.      All other systems negative unless noted above in HPI  Past medical/social/surgical/family history have been reviewed and are unchanged unless specifically indicated below.  No changes  Medication List: Current Outpatient Medications  Medication Sig Dispense Refill   acetaminophen (TYLENOL)  500 MG tablet Take 1 tablet (500 mg total) by mouth every 6 (six) hours as needed. 30 tablet 0   albuterol (VENTOLIN HFA) 108 (90 Base) MCG/ACT inhaler Inhale 2 puffs into the lungs every 6 (six) hours as needed for wheezing or shortness of breath. 8 g 6   atorvastatin (LIPITOR) 80 MG tablet TAKE 1 TABLET BY MOUTH ONCE DAILY 90 tablet 3   azelastine (OPTIVAR) 0.05 % ophthalmic solution Place 1 drop into both eyes 2 (two) times daily. 6 mL 5   Azelastine-Fluticasone (DYMISTA) 137-50 MCG/ACT SUSP Place 1 spray into the nose in the morning and at bedtime. 23 g 5   Budeson-Glycopyrrol-Formoterol (BREZTRI AEROSPHERE) 160-9-4.8 MCG/ACT AERO USE 2 INHALATIONS BY MOUTH  DAILY 32.1 g 2   cholecalciferol (VITAMIN D3) 25 MCG (1000 UNIT) tablet Take 1,000 Units by mouth daily.     clopidogrel (PLAVIX) 75 MG tablet TAKE 1 TABLET BY MOUTH DAILY 100 tablet 2   cyclobenzaprine (FLEXERIL) 5 MG tablet      diazepam (VALIUM) 5 MG tablet Take 5 mg by mouth as needed for anxiety.     diclofenac Sodium (VOLTAREN) 1 % GEL Apply topically 4 (four) times daily.     donepezil (ARICEPT) 5 MG tablet TAKE 1 TABLET BY MOUTH  DAILY 90 tablet 3  ezetimibe (ZETIA) 10 MG tablet Take 1 tablet (10 mg total) by mouth daily. 90 tablet 3   fluorometholone (FML) 0.1 % ophthalmic suspension 1 drop 4 (four) times daily.     gabapentin (NEURONTIN) 100 MG capsule TAKE 1 CAPSULE BY MOUTH 3  TIMES DAILY AS NEEDED 270 capsule 3   glucose blood (ONETOUCH ULTRA) test strip USE WITH METER TO CHECK BLOOD SUGAR BEFORE BREAKFAST AND BEFORE DINNER 100 each 0   hydrochlorothiazide (HYDRODIURIL) 12.5 MG tablet      levocetirizine (XYZAL) 5 MG tablet Take 1 tablet (5 mg total) by mouth every evening. 90 tablet 1   meclizine (ANTIVERT) 25 MG tablet TAKE 1 TABLET BY MOUTH 2  TIMES EVERY DAY AS NEEDED 180 tablet 1   Melatonin 5 MG CAPS Take 1 capsule (5 mg total) by mouth daily after supper. 30 capsule 0   olmesartan (BENICAR) 40 MG tablet       olmesartan-hydrochlorothiazide (BENICAR HCT) 40-12.5 MG tablet TAKE 1 TABLET BY MOUTH DAILY 100 tablet 2   pimecrolimus (ELIDEL) 1 % cream Apply thin layer to the skin as needed twice a day for itching or rash 30 g 5   Probiotic Product (PROBIOTIC-10 PO) Take by mouth. Take one tablet daily     Semaglutide (RYBELSUS) 7 MG TABS Take 1 tablet by mouth daily. 90 tablet 1   vitamin C (ASCORBIC ACID) 500 MG tablet Take 500 mg by mouth daily.     VITAMIN D PO Take 1 tablet by mouth daily.     Current Facility-Administered Medications  Medication Dose Route Frequency Provider Last Rate Last Admin   0.9 %  sodium chloride infusion  500 mL Intravenous Once [name], MD         Known medication allergies: No Known Allergies   Physical examination: Blood pressure 130/78, pulse 60, temperature 98.4 F (36.9 C), temperature source Temporal, resp. rate 12, SpO2 98 %.  General: Alert, interactive, in no acute distress. HEENT: PERRLA, TMs pearly gray, turbinates minimally edematous without discharge, post-pharynx non erythematous. Neck: Supple without lymphadenopathy. Lungs: Clear to auscultation without wheezing, rhonchi or rales. {no increased work of breathing. CV: Normal S1, S2 without murmurs. Abdomen: Nondistended, nontender. Skin: Warm and dry, without lesions or rashes. Extremities:  No clubbing, cyanosis or edema. Neuro:   Grossly intact.  Diagnositics/Labs: None today  Assessment and plan: Allergic rhinitis with conjunctivitis Eyelid dermatitis - Continue avoidance measures for dust mites and cockroach.  - Air purifiers help to purify the air you breathe in and reduce your allergen load in the home.  - stop Dymista nasal spray as this does not seem to be very effective for you - try Atrovent nasal spray 2 sprays each nostril 2-3 times a day for nasal drainage or congestion - use Xyzal 1-2 tabs a day to help control allergy symptoms and itch - try Optivar eye drop 1 drop each  eye twice a day as needed for itchy/watery eyes - can use small amount of vaseline on qtip to apply to the nose to help decrease dryness - for itching/rash of the eyelid and face use Elidel ointment apply thin layer to the skin as needed twice a day for itching or rash  Follow-up in 4-6 months or sooner if needed  I appreciate the opportunity to take part in Louie's care. Please do not hesitate to contact me with questions.  Sincerely,   Prudy Feeler, MD Allergy/Immunology [facility]

## 2022-02-11 ENCOUNTER — Other Ambulatory Visit: Payer: Self-pay

## 2022-02-11 ENCOUNTER — Encounter: Payer: Self-pay | Admitting: Nurse Practitioner

## 2022-02-11 ENCOUNTER — Other Ambulatory Visit: Payer: Self-pay | Admitting: Nurse Practitioner

## 2022-02-11 ENCOUNTER — Ambulatory Visit (INDEPENDENT_AMBULATORY_CARE_PROVIDER_SITE_OTHER): Payer: Medicare Other | Admitting: Nurse Practitioner

## 2022-02-11 VITALS — BP 130/72 | HR 70 | Temp 98.3°F | Ht 65.0 in | Wt 186.0 lb

## 2022-02-11 DIAGNOSIS — J449 Chronic obstructive pulmonary disease, unspecified: Secondary | ICD-10-CM | POA: Diagnosis not present

## 2022-02-11 DIAGNOSIS — R140 Abdominal distension (gaseous): Secondary | ICD-10-CM | POA: Diagnosis not present

## 2022-02-11 DIAGNOSIS — E785 Hyperlipidemia, unspecified: Secondary | ICD-10-CM | POA: Diagnosis not present

## 2022-02-11 DIAGNOSIS — R42 Dizziness and giddiness: Secondary | ICD-10-CM

## 2022-02-11 DIAGNOSIS — E1151 Type 2 diabetes mellitus with diabetic peripheral angiopathy without gangrene: Secondary | ICD-10-CM | POA: Diagnosis not present

## 2022-02-11 DIAGNOSIS — I129 Hypertensive chronic kidney disease with stage 1 through stage 4 chronic kidney disease, or unspecified chronic kidney disease: Secondary | ICD-10-CM | POA: Diagnosis not present

## 2022-02-11 DIAGNOSIS — Z1231 Encounter for screening mammogram for malignant neoplasm of breast: Secondary | ICD-10-CM | POA: Diagnosis not present

## 2022-02-11 DIAGNOSIS — G3184 Mild cognitive impairment, so stated: Secondary | ICD-10-CM

## 2022-02-11 DIAGNOSIS — N1831 Chronic kidney disease, stage 3a: Secondary | ICD-10-CM | POA: Diagnosis not present

## 2022-02-11 MED ORDER — DONEPEZIL HCL 10 MG PO TABS: 10.0000 mg | ORAL_TABLET | Freq: Every day | ORAL | 1 refills | Status: DC

## 2022-02-11 MED ORDER — MECLIZINE HCL 25 MG PO TABS: ORAL_TABLET | ORAL | 1 refills | Status: DC

## 2022-02-11 NOTE — Progress Notes (Signed)
I,Tianna Badgett,acting as a Education administrator for [employer], FNP.,have documented all relevant documentation on the behalf of [name], FNP,as directed by  [name], FNP while in the presence of [name], [place].  Subjective:     Patient ID: Donna Gilmore , female    DOB: [birth year] , 82 y.o.   MRN: [id number]   Chief Complaint  Patient presents with   Hypertension    HPI  Patient is here today for DM and HTN follow up. She is accompanied by caretaker, Delcie Roch. Reports she has been doing well. She has her left hearing aid today but not the right one. She has been to see her allergist and has some medication changes. She   She is going to the gym 2 times a week, she is riding a stationary bike.   Wt Readings from Last 3 Encounters: 02/11/22 : 186 lb (84.4 kg) 01/15/22 : 188 lb 3.2 oz (85.4 kg) 01/08/22 : 188 lb 3.2 oz (85.4 kg)  She reports having regular bowel movements. She is concerned about her stomach being "big". Denies pain. She has a decreased appetite.   Hypertension This is a chronic problem. The current episode started more than 1 year ago. The problem is unchanged. Pertinent negatives include no headaches. There are no associated agents to hypertension. Risk factors for coronary artery disease include sedentary lifestyle. Past treatments include diuretics and ACE inhibitors. There are no compliance problems.  Hypertensive end-organ damage includes angina. There is no history of chronic renal disease.  Diabetes She presents for her follow-up diabetic visit. She has type 2 diabetes mellitus. Pertinent negatives for hypoglycemia include no dizziness or headaches. There are no diabetic associated symptoms. Pertinent negatives for diabetes include no fatigue. There are no hypoglycemic complications. There are no diabetic complications. Risk factors for coronary artery disease include obesity and sedentary lifestyle. She is compliant with treatment all of the time. She is  following a generally healthy diet. When asked about meal planning, she reported none. She has not had a previous visit with a dietitian. She rarely participates in exercise. (Blood sugars are ranging - 99-136. ) An ACE inhibitor/angiotensin II receptor blocker is being taken. She does not see a podiatrist.Eye exam is not current.  Hyperlipidemia This is a chronic problem. The current episode started more than 1 year ago. The problem is controlled. She has no history of chronic renal disease. There are no compliance problems.  Risk factors for coronary artery disease include a sedentary lifestyle, obesity, hypertension and diabetes mellitus.     Past Medical History:  Diagnosis Date   Benign paroxysmal positional vertigo 08/26/2012   CVA (cerebral infarction) 08/25/2012   Diabetes mellitus without complication ([place])    Dyslipidemia 08/25/2012   Essential hypertension, benign 08/25/2012   Hyperlipidemia    Hypertension    Meralgia paraesthetica, left 01/12/2018   Stroke [facility])      Family History  Problem Relation Age of Onset   Kidney failure Mother    Diabetes Mother    Alzheimer's disease Mother    Alzheimer's disease Father    Prostate cancer Father    Diabetes Maternal Grandmother    Diabetes Maternal Grandfather    Alzheimer's disease Paternal Grandmother    Alzheimer's disease Paternal Grandfather    Prostate cancer Paternal Uncle      Current Outpatient Medications:    acetaminophen (TYLENOL) 500 MG tablet, Take 1 tablet (500 mg total) by mouth every 6 (six) hours as needed., Disp: 30 tablet, Rfl:  0   albuterol (VENTOLIN HFA) 108 (90 Base) MCG/ACT inhaler, Inhale 2 puffs into the lungs every 6 (six) hours as needed for wheezing or shortness of breath., Disp: 8 g, Rfl: 6   atorvastatin (LIPITOR) 80 MG tablet, TAKE 1 TABLET BY MOUTH ONCE DAILY, Disp: 90 tablet, Rfl: 3   azelastine (OPTIVAR) 0.05 % ophthalmic solution, Place 1 drop into both eyes 2 (two) times daily., Disp: 6 mL,  Rfl: 5   Azelastine-Fluticasone (DYMISTA) 137-50 MCG/ACT SUSP, Place 1 spray into the nose in the morning and at bedtime., Disp: 23 g, Rfl: 5   Budeson-Glycopyrrol-Formoterol (BREZTRI AEROSPHERE) 160-9-4.8 MCG/ACT AERO, USE 2 INHALATIONS BY MOUTH  DAILY, Disp: 32.1 g, Rfl: 2   cholecalciferol (VITAMIN D3) 25 MCG (1000 UNIT) tablet, Take 1,000 Units by mouth daily., Disp: , Rfl:    citalopram (CELEXA) 10 MG tablet, Take 1 tablet (10 mg total) by mouth daily., Disp: 30 tablet, Rfl: 2   clopidogrel (PLAVIX) 75 MG tablet, TAKE 1 TABLET BY MOUTH DAILY, Disp: 100 tablet, Rfl: 2   cyclobenzaprine (FLEXERIL) 5 MG tablet, , Disp: , Rfl:    diazepam (VALIUM) 5 MG tablet, Take 5 mg by mouth as needed for anxiety., Disp: , Rfl:    diclofenac Sodium (VOLTAREN) 1 % GEL, Apply topically 4 (four) times daily., Disp: , Rfl:    donepezil (ARICEPT) 10 MG tablet, Take 1 tablet (10 mg total) by mouth at bedtime., Disp: 90 tablet, Rfl: 1   ezetimibe (ZETIA) 10 MG tablet, Take 1 tablet (10 mg total) by mouth daily., Disp: 90 tablet, Rfl: 3   fluorometholone (FML) 0.1 % ophthalmic suspension, 1 drop 4 (four) times daily., Disp: , Rfl:    gabapentin (NEURONTIN) 100 MG capsule, TAKE 1 CAPSULE BY MOUTH 3  TIMES DAILY AS NEEDED, Disp: 270 capsule, Rfl: 3   glucose blood (ONETOUCH ULTRA) test strip, USE WITH METER TO CHECK BLOOD SUGAR BEFORE BREAKFAST AND BEFORE DINNER, Disp: 100 each, Rfl: 0   hydrochlorothiazide (HYDRODIURIL) 12.5 MG tablet, , Disp: , Rfl:    levocetirizine (XYZAL) 5 MG tablet, Take 1 tablet (5 mg total) by mouth every evening., Disp: 90 tablet, Rfl: 1   meclizine (ANTIVERT) 25 MG tablet, TAKE 1 TABLET BY MOUTH 2  TIMES EVERY DAY AS NEEDED, Disp: 180 tablet, Rfl: 1   Melatonin 5 MG CAPS, Take 1 capsule (5 mg total) by mouth daily after supper., Disp: 30 capsule, Rfl: 0   olmesartan (BENICAR) 40 MG tablet, , Disp: , Rfl:    olmesartan-hydrochlorothiazide (BENICAR HCT) 40-12.5 MG tablet, TAKE 1 TABLET BY  MOUTH DAILY, Disp: 100 tablet, Rfl: 2   pimecrolimus (ELIDEL) 1 % cream, Apply thin layer to the skin as needed twice a day for itching or rash, Disp: 30 g, Rfl: 5   Probiotic Product (PROBIOTIC-10 PO), Take by mouth. Take one tablet daily, Disp: , Rfl:    Semaglutide (RYBELSUS) 7 MG TABS, Take 1 tablet by mouth daily., Disp: 90 tablet, Rfl: 1   vitamin C (ASCORBIC ACID) 500 MG tablet, Take 500 mg by mouth daily., Disp: , Rfl:    VITAMIN D PO, Take 1 tablet by mouth daily., Disp: , Rfl:   Current Facility-Administered Medications:    0.9 %  sodium chloride infusion, 500 mL, Intravenous, Once, [name], MD   No Known Allergies   Review of Systems  Constitutional: Negative.  Negative for fatigue.  Respiratory: Negative.    Cardiovascular: Negative.   Gastrointestinal: Negative.   Neurological:  Negative.  Negative for dizziness and headaches.  Psychiatric/Behavioral: Negative.       Today's Vitals   02/11/22 1046  BP: 130/72  Pulse: 70  Temp: 98.3 F (36.8 C)  TempSrc: Oral  Weight: 186 lb (84.4 kg)  Height: '5\' 5"'$  (1.651 m)   Body mass index is 30.95 kg/m.   Objective:  Physical Exam Vitals reviewed.  Constitutional:      General: She is not in acute distress.    Appearance: Normal appearance. She is obese.  HENT:     Ears:     Comments: Hard of hearing Cardiovascular:     Rate and Rhythm: Normal rate and regular rhythm.     Pulses: Normal pulses.     Heart sounds: Normal heart sounds. No murmur heard. Pulmonary:     Effort: Pulmonary effort is normal. No respiratory distress.     Breath sounds: Normal breath sounds. No wheezing.  Skin:    General: Skin is warm and dry.     Capillary Refill: Capillary refill takes less than 2 seconds.  Neurological:     General: No focal deficit present.     Mental Status: She is alert and oriented to person, place, and time.     Cranial Nerves: No cranial nerve deficit.     Motor: No weakness.  Psychiatric:        Mood  and Affect: Mood normal.        Behavior: Behavior normal.        Thought Content: Thought content normal.        Judgment: Judgment normal.         Assessment And Plan:     1. Hypertensive nephropathy Comments: Blood pressure is fairly controlled.  Encouraged to focus on lifestyle changes. - BMP8+EGFR  2. Type 2 diabetes mellitus with diabetic peripheral angiopathy without gangrene, without long-term current use of insulin (HCC) Comments: Hemoglobin A1c levels are stable.  Continue current medications - Hemoglobin A1c - BMP8+EGFR  3. Stage 3a chronic kidney disease (HCC) Comments: Continue follow-up with nephrology.  4. Dyslipidemia Comments: Continue statin, tolerating well. - Lipid panel - BMP8+EGFR  5. Vertigo Comments: She is advised to take meclizine as needed.  6. Abdominal distension Comments: No abnormal findings on physical exam.  Will order abdominal KUB to evaluate for possible constipation.  Also encouraged to limit intake of sugary foods - DG Abd 1 View; Future  7. Mild cognitive impairment Comments: Continue current medications. - donepezil (ARICEPT) 10 MG tablet; Take 1 tablet (10 mg total) by mouth at bedtime.  Dispense: 90 tablet; Refill: 1  8. Chronic obstructive pulmonary disease, unspecified COPD type ([place]) Comments: Stable  9. Encounter for screening mammogram for breast cancer Pt instructed on Self Breast Exam.According to ACOG guidelines Women aged 85 and older are recommended to get an annual mammogram. Form completed and given to patient contact the [facility] for appointment scheduing.  Pt encouraged to get annual mammogram - MM Digital Screening; Future     Patient was given opportunity to ask questions. Patient verbalized understanding of the plan and was able to repeat key elements of the plan. All questions were answered to their satisfaction.  [name], FNP   I, [name], FNP, have reviewed all documentation for this  visit. The documentation on 02/14/22 for the exam, diagnosis, procedures, and orders are all accurate and complete.   IF YOU HAVE BEEN REFERRED TO A SPECIALIST, IT MAY TAKE 1-2 WEEKS TO SCHEDULE/PROCESS THE  REFERRAL. IF YOU HAVE NOT HEARD FROM US/SPECIALIST IN TWO WEEKS, PLEASE GIVE [place] A CALL AT [phone].   THE PATIENT IS ENCOURAGED TO PRACTICE SOCIAL DISTANCING DUE TO THE COVID-19 PANDEMIC.

## 2022-02-11 NOTE — Patient Instructions (Signed)
Hypertension, Adult High blood pressure (hypertension) is when the force of blood pumping through the arteries is too strong. The arteries are the blood vessels that carry blood from the heart throughout the body. Hypertension forces the heart to work harder to pump blood and may cause arteries to become narrow or stiff. Untreated or uncontrolled hypertension can lead to a heart attack, heart failure, a stroke, kidney disease, and other problems. A blood pressure reading consists of a higher number over a lower number. Ideally, your blood pressure should be below 120/80. The first ("top") number is called the systolic pressure. It is a measure of the pressure in your arteries as your heart beats. The second ("bottom") number is called the diastolic pressure. It is a measure of the pressure in your arteries as the heart relaxes. What are the causes? The exact cause of this condition is not known. There are some conditions that result in high blood pressure. What increases the risk? Certain factors may make you more likely to develop high blood pressure. Some of these risk factors are under your control, including: Smoking. Not getting enough exercise or physical activity. Being overweight. Having too much fat, sugar, calories, or salt (sodium) in your diet. Drinking too much alcohol. Other risk factors include: Having a personal history of heart disease, diabetes, high cholesterol, or kidney disease. Stress. Having a family history of high blood pressure and high cholesterol. Having obstructive sleep apnea. Age. The risk increases with age. What are the signs or symptoms? High blood pressure may not cause symptoms. Very high blood pressure (hypertensive crisis) may cause: Headache. Fast or irregular heartbeats (palpitations). Shortness of breath. Nosebleed. Nausea and vomiting. Vision changes. Severe chest pain, dizziness, and seizures. How is this diagnosed? This condition is diagnosed by  measuring your blood pressure while you are seated, with your arm resting on a flat surface, your legs uncrossed, and your feet flat on the floor. The cuff of the blood pressure monitor will be placed directly against the skin of your upper arm at the level of your heart. Blood pressure should be measured at least twice using the same arm. Certain conditions can cause a difference in blood pressure between your right and left arms. If you have a high blood pressure reading during one visit or you have normal blood pressure with other risk factors, you may be asked to: Return on a different day to have your blood pressure checked again. Monitor your blood pressure at home for 1 week or longer. If you are diagnosed with hypertension, you may have other blood or imaging tests to help your health care provider understand your overall risk for other conditions. How is this treated? This condition is treated by making healthy lifestyle changes, such as eating healthy foods, exercising more, and reducing your alcohol intake. You may be referred for counseling on a healthy diet and physical activity. Your health care provider may prescribe medicine if lifestyle changes are not enough to get your blood pressure under control and if: Your systolic blood pressure is above 130. Your diastolic blood pressure is above 80. Your personal target blood pressure may vary depending on your medical conditions, your age, and other factors. Follow these instructions at home: Eating and drinking  Eat a diet that is high in fiber and potassium, and low in sodium, added sugar, and fat. An example of this eating plan is called the DASH diet. DASH stands for Dietary Approaches to Stop Hypertension. To eat this way: Eat   plenty of fresh fruits and vegetables. Try to fill one half of your plate at each meal with fruits and vegetables. Eat whole grains, such as whole-wheat pasta, brown rice, or whole-grain bread. Fill about one  fourth of your plate with whole grains. Eat or drink low-fat dairy products, such as skim milk or low-fat yogurt. Avoid fatty cuts of meat, processed or cured meats, and poultry with skin. Fill about one fourth of your plate with lean proteins, such as fish, chicken without skin, beans, eggs, or tofu. Avoid pre-made and processed foods. These tend to be higher in sodium, added sugar, and fat. Reduce your daily sodium intake. Many people with hypertension should eat less than 1,500 mg of sodium a day. Do not drink alcohol if: Your health care provider tells you not to drink. You are pregnant, may be pregnant, or are planning to become pregnant. If you drink alcohol: Limit how much you have to: 0-1 drink a day for women. 0-2 drinks a day for men. Know how much alcohol is in your drink. In the [place] one drink equals one 12 oz bottle of beer (355 mL), one 5 oz glass of wine (148 mL), or one 1 oz glass of hard liquor (44 mL). Lifestyle  Work with your health care provider to maintain a healthy body weight or to lose weight. Ask what an ideal weight is for you. Get at least 30 minutes of exercise that causes your heart to beat faster (aerobic exercise) most days of the week. Activities may include walking, swimming, or biking. Include exercise to strengthen your muscles (resistance exercise), such as Pilates or lifting weights, as part of your weekly exercise routine. Try to do these types of exercises for 30 minutes at least 3 days a week. Do not use any products that contain nicotine or tobacco. These products include cigarettes, chewing tobacco, and vaping devices, such as e-cigarettes. If you need help quitting, ask your health care provider. Monitor your blood pressure at home as told by your health care provider. Keep all follow-up visits. This is important. Medicines Take over-the-counter and prescription medicines only as told by your health care provider. Follow directions carefully. Blood  pressure medicines must be taken as prescribed. Do not skip doses of blood pressure medicine. Doing this puts you at risk for problems and can make the medicine less effective. Ask your health care provider about side effects or reactions to medicines that you should watch for. Contact a health care provider if you: Think you are having a reaction to a medicine you are taking. Have headaches that keep coming back (recurring). Feel dizzy. Have swelling in your ankles. Have trouble with your vision. Get help right away if you: Develop a severe headache or confusion. Have unusual weakness or numbness. Feel faint. Have severe pain in your chest or abdomen. Vomit repeatedly. Have trouble breathing. These symptoms may be an emergency. Get help right away. Call 911. Do not wait to see if the symptoms will go away. Do not drive yourself to the hospital. Summary Hypertension is when the force of blood pumping through your arteries is too strong. If this condition is not controlled, it may put you at risk for serious complications. Your personal target blood pressure may vary depending on your medical conditions, your age, and other factors. For most people, a normal blood pressure is less than 120/80. Hypertension is treated with lifestyle changes, medicines, or a combination of both. Lifestyle changes include losing weight, eating a healthy,   low-sodium diet, exercising more, and limiting alcohol. This information is not intended to replace advice given to you by your health care provider. Make sure you discuss any questions you have with your health care provider. Document Revised: 11/21/2020 Document Reviewed: 11/21/2020 Elsevier Patient Education  2023 [employer]vier Inc.  

## 2022-02-12 ENCOUNTER — Other Ambulatory Visit: Payer: Self-pay | Admitting: Nurse Practitioner

## 2022-02-12 DIAGNOSIS — F331 Major depressive disorder, recurrent, moderate: Secondary | ICD-10-CM

## 2022-02-12 DIAGNOSIS — E1159 Type 2 diabetes mellitus with other circulatory complications: Secondary | ICD-10-CM

## 2022-02-12 LAB — BMP8+EGFR
BUN/Creatinine Ratio: 8 — ABNORMAL LOW (ref 12–28)
BUN: 10 mg/dL (ref 8–27)
CO2: 23 mmol/L (ref 20–29)
Calcium: 10.1 mg/dL (ref 8.7–10.3)
Chloride: 101 mmol/L (ref 96–106)
Creatinine, Ser: 1.28 mg/dL — ABNORMAL HIGH (ref 0.57–1.00)
Glucose: 105 mg/dL — ABNORMAL HIGH (ref 70–99)
Potassium: 4.1 mmol/L (ref 3.5–5.2)
Sodium: 141 mmol/L (ref 134–144)
eGFR: 42 mL/min/{1.73_m2} — ABNORMAL LOW (ref >59)

## 2022-02-12 LAB — HEMOGLOBIN A1C
Est. average glucose Bld gHb Est-mCnc: 169 mg/dL
Hgb A1c MFr Bld: 7.5 % — ABNORMAL HIGH (ref 4.8–5.6)

## 2022-02-12 LAB — LIPID PANEL
Chol/HDL Ratio: 3.1 ratio (ref 0.0–4.4)
Cholesterol, Total: 173 mg/dL (ref 100–199)
HDL: 56 mg/dL (ref >39)
LDL Chol Calc (NIH): 78 mg/dL (ref 0–99)
Triglycerides: 239 mg/dL — ABNORMAL HIGH (ref 0–149)
VLDL Cholesterol Cal: 39 mg/dL (ref 5–40)

## 2022-02-12 MED ORDER — CITALOPRAM HYDROBROMIDE 10 MG PO TABS: 10.0000 mg | ORAL_TABLET | Freq: Every day | ORAL | 2 refills | Status: DC

## 2022-02-12 MED ORDER — RYBELSUS 7 MG PO TABS: 1.0000 | ORAL_TABLET | Freq: Every day | ORAL | 1 refills | Status: DC

## 2022-02-14 ENCOUNTER — Ambulatory Visit: Payer: Self-pay

## 2022-02-14 NOTE — Patient Outreach (Signed)
  Care Coordination   02/14/2022 Name: Donna Gilmore MRN: [id number] DOB: 1940/07/19   Care Coordination Outreach Attempts:  An unsuccessful telephone outreach was attempted for a scheduled appointment today.  Follow Up Plan:  Additional outreach attempts will be made to offer the patient care coordination information and services.   Encounter Outcome:  No Answer   Care Coordination Interventions:  No, not indicated    Barb Merino, RN, BSN, CCM Care Management Coordinator [facility] Care Management  Direct Phone: [phone]

## 2022-02-20 ENCOUNTER — Telehealth: Payer: Self-pay

## 2022-02-20 NOTE — Telephone Encounter (Signed)
Received call back from pt. Calling to followup as pt has missed a few days of PREP.  Pt sts she has too much going on right now to continue.  Asked her to call me if she wants to redo program however will need another referral.  Attended 9 classes total.

## 2022-02-20 NOTE — Telephone Encounter (Signed)
Call to pt reference missed days of PREP classes.  Unable to leave message on number for pt.  LVMT pt's daughter [phone] Olivia Mackie requesting call back to see how pt is.

## 2022-02-27 NOTE — Progress Notes (Signed)
Rescheduled 2/15  [place]  Direct Dial: [phone]

## 2022-03-11 ENCOUNTER — Telehealth: Payer: Self-pay

## 2022-03-11 ENCOUNTER — Ambulatory Visit (INDEPENDENT_AMBULATORY_CARE_PROVIDER_SITE_OTHER): Payer: Medicare Other | Admitting: Nurse Practitioner

## 2022-03-11 VITALS — BP 128/70 | HR 78 | Temp 97.8°F | Ht 65.0 in | Wt 184.8 lb

## 2022-03-11 DIAGNOSIS — J3489 Other specified disorders of nose and nasal sinuses: Secondary | ICD-10-CM

## 2022-03-11 DIAGNOSIS — F419 Anxiety disorder, unspecified: Secondary | ICD-10-CM | POA: Diagnosis not present

## 2022-03-11 DIAGNOSIS — R0602 Shortness of breath: Secondary | ICD-10-CM

## 2022-03-11 DIAGNOSIS — Z8709 Personal history of other diseases of the respiratory system: Secondary | ICD-10-CM | POA: Diagnosis not present

## 2022-03-11 DIAGNOSIS — R5383 Other fatigue: Secondary | ICD-10-CM | POA: Diagnosis not present

## 2022-03-11 DIAGNOSIS — R531 Weakness: Secondary | ICD-10-CM

## 2022-03-11 DIAGNOSIS — F331 Major depressive disorder, recurrent, moderate: Secondary | ICD-10-CM | POA: Diagnosis not present

## 2022-03-11 DIAGNOSIS — R519 Headache, unspecified: Secondary | ICD-10-CM

## 2022-03-11 MED ORDER — CITALOPRAM HYDROBROMIDE 20 MG PO TABS: 10.0000 mg | ORAL_TABLET | Freq: Every day | ORAL | 2 refills | Status: DC

## 2022-03-11 MED ORDER — BREZTRI AEROSPHERE 160-9-4.8 MCG/ACT IN AERO: INHALATION_SPRAY | RESPIRATORY_TRACT | 2 refills | Status: DC

## 2022-03-11 MED ORDER — DIAZEPAM 5 MG PO TABS: 5.0000 mg | ORAL_TABLET | Freq: Three times a day (TID) | ORAL | 0 refills | Status: DC | PRN

## 2022-03-11 MED ORDER — DIAZEPAM 5 MG PO TABS: 5.0000 mg | ORAL_TABLET | ORAL | 0 refills | Status: DC | PRN

## 2022-03-11 NOTE — Telephone Encounter (Signed)
Spoke with patient's son and he indicates patient seems to be having increased anxiety. He states the caregiver also agrees. He is requesting refill on Diazepam. Per chart patient has not had this medication since 2019. Son is asking for this rx to be sent in ASAP as patient "is in a bad way".  Please advise, thank you!

## 2022-03-11 NOTE — Progress Notes (Unsigned)
Barnet [place] Martin,acting as a Education administrator for [name], FNP.,have documented all relevant documentation on the behalf of [name], FNP,as directed by  [name], FNP while in the presence of [name], [place].    Subjective:     Patient ID: Donna Gilmore , female    DOB: Aug 28, 1940 , 82 y.o.   MRN: [id number]   Chief Complaint  Patient presents with   Panic Attack   URI    HPI  Patient presents today for having having panic attacks, her care provider and son states she has been having more than usual panic attacks, she states it is worse when she starts to sundown,  Patient states she had shortness of breath, heaviness. They state her allergies are worsening with congestion, post nasal drip, ear pain.   Her son is concerned about her being nervous and scared - she was taking valium last given in 2019. Her son feels she needs to start back on her valium. She is reporting having shortness of breath trying to breath, states "I feel like my head is full". She is constantly checking her blood sugar, blood pressure. She has had these symptoms for several months.   Naomi (care giver) in the last week has had 3 panic attacks with screaming. She helped to calm her down with breathing. Then went into depression. She is unable to do what she used to be able to do. She lives in a big house and her son lives with her and comes home in the evening. After 5pm she becomes panic and anxiety. Everyday living with her. A big issue with her breathing. She has a hard time walking from bedroom to the kitchen. The last time was last year.  BP Readings from Last 3 Encounters: 03/11/22 : 128/70 02/11/22 : 130/72 02/06/22 : 130/78       Past Medical History:  Diagnosis Date   Benign paroxysmal positional vertigo 08/26/2012   CVA (cerebral infarction) 08/25/2012   Diabetes mellitus without complication ([place])    Dyslipidemia 08/25/2012   Essential hypertension, benign 08/25/2012   Hyperlipidemia     Hypertension    Meralgia paraesthetica, left 01/12/2018   Stroke [facility])      Family History  Problem Relation Age of Onset   Kidney failure Mother    Diabetes Mother    Alzheimer's disease Mother    Alzheimer's disease Father    Prostate cancer Father    Diabetes Maternal Grandmother    Diabetes Maternal Grandfather    Alzheimer's disease Paternal Grandmother    Alzheimer's disease Paternal Grandfather    Prostate cancer Paternal Uncle      Current Outpatient Medications:    acetaminophen (TYLENOL) 500 MG tablet, Take 1 tablet (500 mg total) by mouth every 6 (six) hours as needed., Disp: 30 tablet, Rfl: 0   albuterol (VENTOLIN HFA) 108 (90 Base) MCG/ACT inhaler, Inhale 2 puffs into the lungs every 6 (six) hours as needed for wheezing or shortness of breath., Disp: 8 g, Rfl: 6   atorvastatin (LIPITOR) 80 MG tablet, TAKE 1 TABLET BY MOUTH ONCE DAILY, Disp: 90 tablet, Rfl: 3   azelastine (OPTIVAR) 0.05 % ophthalmic solution, Place 1 drop into both eyes 2 (two) times daily., Disp: 6 mL, Rfl: 5   Azelastine-Fluticasone (DYMISTA) 137-50 MCG/ACT SUSP, Place 1 spray into the nose in the morning and at bedtime., Disp: 23 g, Rfl: 5   cholecalciferol (VITAMIN D3) 25 MCG (1000 UNIT) tablet, Take 1,000 Units by mouth daily., Disp: ,  Rfl:    clopidogrel (PLAVIX) 75 MG tablet, TAKE 1 TABLET BY MOUTH DAILY, Disp: 100 tablet, Rfl: 2   cyclobenzaprine (FLEXERIL) 5 MG tablet, , Disp: , Rfl:    diclofenac Sodium (VOLTAREN) 1 % GEL, Apply topically 4 (four) times daily., Disp: , Rfl:    donepezil (ARICEPT) 10 MG tablet, Take 1 tablet (10 mg total) by mouth at bedtime., Disp: 90 tablet, Rfl: 1   ezetimibe (ZETIA) 10 MG tablet, Take 1 tablet (10 mg total) by mouth daily., Disp: 90 tablet, Rfl: 3   fluorometholone (FML) 0.1 % ophthalmic suspension, 1 drop 4 (four) times daily., Disp: , Rfl:    gabapentin (NEURONTIN) 100 MG capsule, TAKE 1 CAPSULE BY MOUTH 3  TIMES DAILY AS NEEDED, Disp: 270 capsule, Rfl: 3    glucose blood (ONETOUCH ULTRA) test strip, USE WITH METER TO CHECK BLOOD SUGAR BEFORE BREAKFAST AND BEFORE DINNER, Disp: 100 each, Rfl: 0   hydrochlorothiazide (HYDRODIURIL) 12.5 MG tablet, , Disp: , Rfl:    levocetirizine (XYZAL) 5 MG tablet, Take 1 tablet (5 mg total) by mouth every evening., Disp: 90 tablet, Rfl: 1   meclizine (ANTIVERT) 25 MG tablet, TAKE 1 TABLET BY MOUTH 2  TIMES EVERY DAY AS NEEDED, Disp: 180 tablet, Rfl: 1   Melatonin 5 MG CAPS, Take 1 capsule (5 mg total) by mouth daily after supper., Disp: 30 capsule, Rfl: 0   olmesartan-hydrochlorothiazide (BENICAR HCT) 40-12.5 MG tablet, TAKE 1 TABLET BY MOUTH DAILY, Disp: 100 tablet, Rfl: 2   pimecrolimus (ELIDEL) 1 % cream, Apply thin layer to the skin as needed twice a day for itching or rash, Disp: 30 g, Rfl: 5   Probiotic Product (PROBIOTIC-10 PO), Take by mouth. Take one tablet daily, Disp: , Rfl:    Semaglutide (RYBELSUS) 7 MG TABS, Take 1 tablet by mouth daily., Disp: 90 tablet, Rfl: 1   vitamin C (ASCORBIC ACID) 500 MG tablet, Take 500 mg by mouth daily., Disp: , Rfl:    VITAMIN D PO, Take 1 tablet by mouth daily., Disp: , Rfl:    Budeson-Glycopyrrol-Formoterol (BREZTRI AEROSPHERE) 160-9-4.8 MCG/ACT AERO, USE 2 INHALATIONS BY MOUTH  DAILY, Disp: 32.1 g, Rfl: 2   citalopram (CELEXA) 20 MG tablet, Take 0.5 tablets (10 mg total) by mouth daily., Disp: 30 tablet, Rfl: 2   diazepam (VALIUM) 5 MG tablet, Take 1 tablet (5 mg total) by mouth every 8 (eight) hours as needed for anxiety., Disp: 20 tablet, Rfl: 0  Current Facility-Administered Medications:    0.9 %  sodium chloride infusion, 500 mL, Intravenous, Once, [name], MD   No Known Allergies   Review of Systems  Constitutional: Negative.   HENT: Negative.    Eyes: Negative.   Respiratory: Negative.    Cardiovascular: Negative.   Gastrointestinal: Negative.   Musculoskeletal: Negative.   Psychiatric/Behavioral:  The patient is nervous/anxious.      Today's  Vitals   03/11/22 1105  BP: 128/70  Pulse: 78  Temp: 97.8 F (36.6 C)  TempSrc: Oral  Weight: 184 lb 12.8 oz (83.8 kg)  Height: 5' 5"$  (1.651 m)  PainSc: 0-No pain   Body mass index is 30.75 kg/m.  Wt Readings from Last 3 Encounters:  03/11/22 184 lb 12.8 oz (83.8 kg)  02/11/22 186 lb (84.4 kg)  01/15/22 188 lb 3.2 oz (85.4 kg)    Objective:  Physical Exam Vitals reviewed.  Constitutional:      General: She is not in acute distress.  Appearance: Normal appearance. She is obese.  HENT:     Ears:     Comments: Hard of hearing Cardiovascular:     Rate and Rhythm: Normal rate and regular rhythm.     Pulses: Normal pulses.     Heart sounds: Normal heart sounds. No murmur heard. Pulmonary:     Effort: Pulmonary effort is normal. No respiratory distress.     Breath sounds: Normal breath sounds. No wheezing.  Skin:    General: Skin is warm and dry.     Capillary Refill: Capillary refill takes less than 2 seconds.  Neurological:     General: No focal deficit present.     Mental Status: She is alert and oriented to person, place, and time.     Cranial Nerves: No cranial nerve deficit.     Motor: No weakness.  Psychiatric:        Mood and Affect: Mood normal.        Behavior: Behavior normal.        Thought Content: Thought content normal.        Judgment: Judgment normal.        07/26/2020    3:59 PM 06/30/2019   10:01 AM 06/24/2018    2:33 PM  6CIT Screen  What Year? 0 points 0 points 0 points  What month? 0 points 0 points 0 points  What time? 0 points 0 points 0 points  Count back from 20 0 points 0 points 0 points  Months in reverse 2 points 0 points 0 points  Repeat phrase 4 points 0 points 0 points  Total Score 6 points 0 points 0 points         Assessment And Plan:     1. Moderate episode of recurrent major depressive disorder (HCC) Comments: Depression screen score of 21 will increase her citalopram to 20 mg. F/u in 6 weeks. - citalopram (CELEXA) 20  MG tablet; Take 0.5 tablets (10 mg total) by mouth daily.  Dispense: 30 tablet; Refill: 2  2. Anxiety Comments: Son concerned about increasing anxiety, long discussion about trying not to focus on what she can not do. Will provide limited amount of Valium, PDMP - citalopram (CELEXA) 20 MG tablet; Take 0.5 tablets (10 mg total) by mouth daily.  Dispense: 30 tablet; Refill: 2 - diazepam (VALIUM) 5 MG tablet; Take 1 tablet (5 mg total) by mouth every 8 (eight) hours as needed for anxiety.  Dispense: 20 tablet; Refill: 0  3. Shortness of breath Comments: No dyspnea noted, discussed this may be related to deconditioning, I will order home health PT to assist with energy conservation and strengthening. - DG Chest 2 View; Future - Brain natriuretic peptide - BMP8+eGFR - CBC no Diff - Iron, TIBC and Ferritin Panel - TSH - Ambulatory referral to Home Health  4. Other fatigue Comments: Will check for metabolic causes.  Also discussed may be related to deconditioning versus depression. - TSH - Ambulatory referral to Home Health  5. Generalized weakness Comments: May be related to deconditioning.  Will order home health PT to assist with strengthening - Ambulatory referral to [place]  6. Sinus headache - POC Influenza A&B(BINAX/QUICKVUE)  7. Drainage from nose Comments: She has a history of seasonal allergies.  Advised to continue taking her allergy medication - POC COVID-19 BinaxNow  8. History of COPD Comments: She is advised to follow up with pulmonology.  Reports taking her maintenance inhalers as directed.  No abnormal findings on physical exam. -  Budeson-Glycopyrrol-Formoterol (BREZTRI AEROSPHERE) 160-9-4.8 MCG/ACT AERO; USE 2 INHALATIONS BY MOUTH  DAILY  Dispense: 32.1 g; Refill: 2   Had a long discussion with her son and caregiver in addition to her brother explaining her disease processes and the need for her medications.  Will reach out to pharmacy to do medication  reconciliation to determine if she needs or does not need to start her medications.  Patient was given opportunity to ask questions. Patient verbalized understanding of the plan and was able to repeat key elements of the plan. All questions were answered to their satisfaction.  [name], FNP   I, [name], FNP, have reviewed all documentation for this visit. The documentation on 03/11/22 for the exam, diagnosis, procedures, and orders are all accurate and complete.   IF YOU HAVE BEEN REFERRED TO A SPECIALIST, IT MAY TAKE 1-2 WEEKS TO SCHEDULE/PROCESS THE REFERRAL. IF YOU HAVE NOT HEARD FROM US/SPECIALIST IN TWO WEEKS, PLEASE GIVE [place] A CALL AT [phone].   THE PATIENT IS ENCOURAGED TO PRACTICE SOCIAL DISTANCING DUE TO THE COVID-19 PANDEMIC.

## 2022-03-11 NOTE — Patient Instructions (Addendum)
Panic Attack A panic attack is when you suddenly feel very afraid, uncomfortable, or nervous (anxious). A panic attack can happen when you are scared, or it may happen for no reason. A panic attack can feel like a heart attack or stroke. See your doctor when you have a panic attack to make sure you are not having a heart attack or stroke. What are the causes? Experiencing things that threaten your life, such as a war. Feeling worried or nervous for a long time (anxiety disorder). Being sad (depressed). Panic disorder. Certain medical conditions. Other causes may include: Certain medicines. Taking certain supplements. Illegal drugs. What increases the risk? Having another mental health condition. Using alcohol or drugs. Being under a lot of stress. Having events in your life that cause worry and sadness. What are the signs or symptoms? A panic attack: Starts suddenly. May last 5-10 minutes. Symptoms include one or more of these: A pounding heart. A feeling that your heart is beating in an unusual way or faster than normal (palpitations). Sweating or shaking. Feeling short of breath. Chest pain. Feeling like you may vomit (nauseous). Feeling dizzy or like you might faint. Other symptoms may include: Chills or hot flashes. Numbness or tingling in your lips, hands, or feet. Feeling confused. Fear of losing control. Fear of dying. How is this treated? A panic attack is a symptom of another condition. Treatment depends on the cause of the panic attack. If the cause is a medical problem, your doctor will treat that problem or refer you to a specialist. If the cause is emotional, you may be given medicines or referred to a counselor. If the cause is a medicine, your doctor may tell you to stop the medicine, change your dose, or take a different medicine. If the cause is an illegal drug, treatment may involve letting the drug wear off and taking medicine to help the drug leave your  body or to stop its effects. Attacks caused by heavy drug use may continue even if you stop using the drug. Most panic attacks go away after the cause is treated. Follow these instructions at home: Alcohol use Do not drink alcohol if: Your doctor tells you not to drink. You are pregnant, may be pregnant, or are planning to become pregnant. If you drink alcohol: Limit how much you have to: 0-1 drink a day for women. 0-2 drinks a day for men. Know how much alcohol is in your drink. In the [place] one drink equals one 12 oz bottle of beer (355 mL), one 5 oz glass of wine (148 mL), or one 1 oz glass of hard liquor (44 mL). General instructions  Take over-the-counter and prescription medicines only as told by your doctor. If you feel worried or nervous, try not to have caffeine. Take good care of your health. To do this: Eat healthy. Make sure to eat fresh fruits and vegetables, whole grains, lean meats, and low-fat dairy. Get enough sleep. Try to sleep for 7-8 hours each night. Exercise. Try to be active for 30 minutes 5 or more days a week. Do not smoke or use any products that contain nicotine or tobacco. If you need help quitting, ask your doctor. Keep all follow-up visits. Where to find more information Substance Abuse and [facility]): [employer].com.[facility]): https://carter.com/ Contact a doctor if: Your symptoms do not get better. Your symptoms get worse. You are not able to take your medicines as told. Get help  right away if: You have thoughts of hurting yourself or others. Get help right away if you feel like you may hurt yourself or others, or have thoughts about taking your own life. Go to your nearest emergency room or: Call 911. Call the [place] at [phone] or [phone]. This is open 24 hours a day. Text the Crisis Text Line at 510-696-1009. Summary A panic attack is when you suddenly feel  very afraid, uncomfortable, or nervous (anxious). See your doctor when you have a panic attack to make sure that you do not have another serious problem. If you feel like you may hurt yourself or others, get help right away. Call 911. This information is not intended to replace advice given to you by your health care provider. Make sure you discuss any questions you have with your health care provider. Document Revised: 08/24/2020 Document Reviewed: 08/24/2020 Elsevier Patient Education  [place].

## 2022-03-12 LAB — CBC
Hematocrit: 35.9 % (ref 34.0–46.6)
Hemoglobin: 11.7 g/dL (ref 11.1–15.9)
MCH: 28.4 pg (ref 26.6–33.0)
MCHC: 32.6 g/dL (ref 31.5–35.7)
MCV: 87 fL (ref 79–97)
Platelets: 262 10*3/uL (ref 150–450)
RBC: 4.12 x10E6/uL (ref 3.77–5.28)
RDW: 14.1 % (ref 11.7–15.4)
WBC: 8.5 10*3/uL (ref 3.4–10.8)

## 2022-03-12 LAB — BMP8+EGFR
BUN/Creatinine Ratio: 9 — ABNORMAL LOW (ref 12–28)
BUN: 14 mg/dL (ref 8–27)
CO2: 20 mmol/L (ref 20–29)
Calcium: 10 mg/dL (ref 8.7–10.3)
Chloride: 101 mmol/L (ref 96–106)
Creatinine, Ser: 1.48 mg/dL — ABNORMAL HIGH (ref 0.57–1.00)
Glucose: 96 mg/dL (ref 70–99)
Potassium: 4.6 mmol/L (ref 3.5–5.2)
Sodium: 139 mmol/L (ref 134–144)
eGFR: 35 mL/min/{1.73_m2} — ABNORMAL LOW (ref >59)

## 2022-03-12 LAB — TSH: TSH: 3.22 u[IU]/mL (ref 0.450–4.500)

## 2022-03-12 LAB — IRON,TIBC AND FERRITIN PANEL
Ferritin: 105 ng/mL (ref 15–150)
Iron Saturation: 36 % (ref 15–55)
Iron: 112 ug/dL (ref 27–139)
Total Iron Binding Capacity: 314 ug/dL (ref 250–450)
UIBC: 202 ug/dL (ref 118–369)

## 2022-03-12 LAB — BRAIN NATRIURETIC PEPTIDE: BNP: 24.6 pg/mL (ref 0.0–100.0)

## 2022-03-13 ENCOUNTER — Encounter: Payer: Self-pay | Admitting: Nurse Practitioner

## 2022-03-14 ENCOUNTER — Ambulatory Visit: Payer: Self-pay

## 2022-03-14 ENCOUNTER — Encounter: Payer: Self-pay | Admitting: Nurse Practitioner

## 2022-03-14 DIAGNOSIS — F331 Major depressive disorder, recurrent, moderate: Secondary | ICD-10-CM | POA: Insufficient documentation

## 2022-03-14 DIAGNOSIS — F32A Depression, unspecified: Secondary | ICD-10-CM | POA: Insufficient documentation

## 2022-03-14 DIAGNOSIS — F419 Anxiety disorder, unspecified: Secondary | ICD-10-CM | POA: Insufficient documentation

## 2022-03-14 DIAGNOSIS — R531 Weakness: Secondary | ICD-10-CM | POA: Insufficient documentation

## 2022-03-14 LAB — POC INFLUENZA A&B (BINAX/QUICKVUE)
Influenza A, POC: NEGATIVE
Influenza B, POC: NEGATIVE

## 2022-03-14 LAB — POC COVID19 BINAXNOW: SARS Coronavirus 2 Ag: NEGATIVE

## 2022-03-14 NOTE — Patient Outreach (Signed)
  Care Coordination   Follow Up Visit Note   03/14/2022 Name: Donna Gilmore MRN: [id number] DOB: Aug 10, 1940  Donna Gilmore is a 82 y.o. year old female who sees [name], [place] for primary care. I spoke with patient's son Donna Gilmore by phone today.  What matters to the patients health and wellness today?  Son Donna Gilmore would like to speak with me regarding his mother's health.     Goals Addressed             This Visit's Progress    RN Care Coordination Activities: further follow up needed       Care Coordination Interventions: Spoke with son Donna Gilmore who lives with and cares for patient Determined he would like to speak with me about his mother, however the call was inadvertently disconnected, unable to reconnect with son Sent message to care guide Donna Gilmore requesting she contact son Donna Gilmore to reschedule nurse call           SDOH assessments and interventions completed:  No     Care Coordination Interventions:  Yes, provided   Follow up plan:  pending contact with son to reschedule nurse call     Encounter Outcome:  Pt. Visit Completed

## 2022-03-19 ENCOUNTER — Telehealth: Payer: Self-pay | Admitting: *Deleted

## 2022-03-19 NOTE — Telephone Encounter (Signed)
Patient seen in office

## 2022-03-19 NOTE — Progress Notes (Signed)
  Care Coordination Note  03/19/2022 Name: Donna Gilmore MRN: [id number] DOB: 08/12/1940  Donna Gilmore is a 82 y.o. year old female who is a primary care patient of [name], [place] and is actively engaged with the care management team. I reached out to [employer] by phone today to assist with re-scheduling a follow up visit with the RN Case Manager  Follow up plan: Unsuccessful telephone outreach attempt made.   [place]  Direct Dial: [phone]

## 2022-03-25 NOTE — Progress Notes (Signed)
  Care Coordination Note  03/25/2022 Name: AMADEA VIK MRN: [id number] DOB: October 10, 1940  Glendoria Servais Crombie is a 82 y.o. year old female who is a primary care patient of [name], [place] and is actively engaged with the care management team. I reached out to Alda Lea by phone today to assist with re-scheduling a follow up visit with the RN Case Manager  Follow up plan: Telephone appointment with care management team member scheduled for:03/26/22  [place]  Direct Dial: [phone]

## 2022-03-25 NOTE — Progress Notes (Signed)
  Care Coordination Note  03/25/2022 Name: Donna Gilmore MRN: [id number] DOB: 1941/01/02  Donna Gilmore is a 82 y.o. year old female who is a primary care patient of [name], [place] and is actively engaged with the care management team. I reached out to [employer] by phone today to assist with re-scheduling a follow up visit with the RN Case Manager  Follow up plan: Unsuccessful telephone outreach attempt made.   [place]  Direct Dial: [phone]

## 2022-03-26 ENCOUNTER — Ambulatory Visit: Payer: Self-pay

## 2022-03-27 NOTE — Patient Instructions (Signed)
Visit Information  Thank you for taking time to visit with me today. Please don't hesitate to contact me if I can be of assistance to you.   Following are the goals we discussed today:   Goals Addressed             This Visit's Progress    COMPLETED: RN Care Coordination Activities: further follow up needed       Care Coordination Interventions: Spoke with son Rae Halsted who lives with and cares for patient Determined he would like to speak with me about his mother, however the call was inadvertently disconnected, unable to reconnect with son Sent message to care guide Laverda Sorenson requesting she contact son Evette Doffing to reschedule nurse call        To improve fatigue and strengthening       Care Coordination Interventions: Evaluation of current treatment plan related to worsening fatigue and patient's adherence to plan as established by provider Reviewed and discussed with patient PCP referral placed on 03/11/22 for in home PT to help patient with strengthening and endurance Determined patient has yet to be contacted by a home health agency Determined the referral is still in pending status Collaborated with Andre Lefort, referral coordinator regarding referral status, discussed having referral sent to [facility] as patient states this provider is listed on her U card as an in-network provider Educated patient about the importance of staying well hydrated with water and balancing her activity with rest     To work on lowering A1c <7.0 %       Care Coordination Interventions: Provided education to patient about basic DM disease process Reviewed medications with patient and discussed importance of medication adherence Provided patient with written educational materials related to hypo and hyperglycemia and importance of correct treatment Advised patient, providing education and rationale, to check cbg daily before breakfast and at bedtime and record, calling PCP for findings outside  established parameters Referral made to pharmacy team for assistance with medication review of CBD Spectrum plus; review diabetes regimen and CKD Review of patient status, including review of consultants reports, relevant laboratory and other test results, and medications completed Counseled on Diabetic diet, my plate method, [id number] minutes of moderate intensity exercise/week Mailed printed educational materials related to Diabetes management  Lab Results  Component Value Date   HGBA1C 7.5 (H) 02/11/2022              Our next appointment is by telephone on 04/24/22 at 1:00 PM  Please call the care guide team at [phone] if you need to cancel or reschedule your appointment.   If you are experiencing a Mental Health or [place] or need someone to talk to, please call 1-800-273-TALK (toll free, 24 hour hotline) go to [facility] [address], [place] [phone])  Patient verbalizes understanding of instructions and care plan provided today and agrees to view in [place]. Active MyChart status and patient understanding of how to access instructions and care plan via MyChart confirmed with patient.     Barb Merino, RN, BSN, CCM Care Management Coordinator [facility] Management  Direct Phone: [phone]

## 2022-03-27 NOTE — Patient Outreach (Signed)
Care Coordination   Follow Up Visit Note   03/26/2022 Name: Donna Gilmore MRN: [id number] DOB: [birth year]  Donna Gilmore is a 82 y.o. year old female who sees [name], [place] for primary care. I spoke with  Donna Gilmore and son Donna Gilmore by phone today.  What matters to the patients health and wellness today?  Patient will work on better adherence to her diabetes friendly diet. She will work with in home PT to help with strengthening and endurance.     Goals Addressed             This Visit's Progress    COMPLETED: RN Care Coordination Activities: further follow up needed       Care Coordination Interventions: Spoke with son Donna Gilmore who lives with and cares for patient Determined he would like to speak with me about his mother, however the call was inadvertently disconnected, unable to reconnect with son Sent message to care guide Donna Gilmore requesting she contact son Donna Gilmore to reschedule nurse call        To improve fatigue and strengthening       Care Coordination Interventions: Evaluation of current treatment plan related to worsening fatigue and patient's adherence to plan as established by provider Reviewed and discussed with patient PCP referral placed on 03/11/22 for in home PT to help patient with strengthening and endurance Determined patient has yet to be contacted by a home health agency Determined the referral is still in pending status Collaborated with Donna Gilmore, referral coordinator regarding referral status, discussed having referral sent to [facility] as patient states this provider is listed on her U card as an in-network provider Educated patient about the importance of staying well hydrated with water and balancing her activity with rest     To work on lowering A1c <7.0 %       Care Coordination Interventions: Provided education to patient about basic DM disease process Reviewed medications with patient and discussed importance of  medication adherence Provided patient with written educational materials related to hypo and hyperglycemia and importance of correct treatment Advised patient, providing education and rationale, to check cbg daily before breakfast and at bedtime and record, calling PCP for findings outside established parameters Referral made to pharmacy team for assistance with medication review of CBD Spectrum plus; review diabetes regimen and CKD Review of patient status, including review of consultants reports, relevant laboratory and other test results, and medications completed Counseled on Diabetic diet, my plate method, [id number] minutes of moderate intensity exercise/week Mailed printed educational materials related to Diabetes management  Lab Results  Component Value Date   HGBA1C 7.5 (H) 02/11/2022          Interventions Today    Flowsheet Row Most Recent Value  Chronic Disease   Chronic disease during today's visit Diabetes, Chronic Kidney Disease/End Stage Renal Disease (ESRD), Other  [vascular dementia]  General Interventions   General Interventions Discussed/Reviewed General Interventions Discussed, General Interventions Reviewed  Exercise Interventions   Exercise Discussed/Reviewed Physical Activity  Physical Activity Discussed/Reviewed Physical Activity Discussed  Education Interventions   Education Provided Provided Printed Education  Mental Health Interventions   Mental Health Discussed/Reviewed Mental Health Discussed, Mental Health Reviewed, Anxiety, Other  [change in memory]  Nutrition Interventions   Nutrition Discussed/Reviewed Fluid intake, Decreasing sugar intake  Pharmacy Interventions   Pharmacy Dicussed/Reviewed Pharmacy Topics Discussed, Pharmacy Topics Reviewed, Medications and their functions, Referral to Pharmacist  Referral to Pharmacist Drug interaction/side effects  SDOH assessments and interventions completed:  No     Care Coordination  Interventions:  Yes, provided   Follow up plan: Follow up call scheduled for 04/24/22 '@1'$ :00 PM    Encounter Outcome:  Pt. Visit Completed

## 2022-03-27 NOTE — Patient Outreach (Signed)
Care Coordination   Follow Up Visit Note   03/26/2022 Name: Donna Gilmore MRN: [id number] DOB: [birth year]  Donna Gilmore is a 82 y.o. year old female who sees [name], [place] for primary care. I spoke with  Alda Lea and son Evette Doffing by phone today.  What matters to the patients health and wellness today?  Patient will improve her eating habits, adhering to her diabetic diet. Patient will work with in home PT for strengthening and endurance.     Goals Addressed             This Visit's Progress    COMPLETED: RN Care Coordination Activities: further follow up needed       Care Coordination Interventions: Spoke with son Rae Halsted who lives with and cares for patient Determined he would like to speak with me about his mother, however the call was inadvertently disconnected, unable to reconnect with son Sent message to care guide Laverda Sorenson requesting she contact son Evette Doffing to reschedule nurse call        To improve fatigue and strengthening       Care Coordination Interventions: Evaluation of current treatment plan related to worsening fatigue and patient's adherence to plan as established by provider Reviewed and discussed with patient PCP referral placed on 03/11/22 for in home PT to help patient with strengthening and endurance Determined patient has yet to be contacted by a home health agency Determined the referral is still in pending status Collaborated with Andre Lefort, referral coordinator regarding referral status, discussed having referral sent to [facility] as patient states this provider is listed on her U card as an in-network provider Educated patient about the importance of staying well hydrated with water and balancing her activity with rest     To work on lowering A1c <7.0 %       Care Coordination Interventions: Provided education to patient about basic DM disease process Reviewed medications with patient and discussed importance of  medication adherence Provided patient with written educational materials related to hypo and hyperglycemia and importance of correct treatment Advised patient, providing education and rationale, to check cbg daily before breakfast and at bedtime and record, calling PCP for findings outside established parameters Referral made to pharmacy team for assistance with medication review of CBD Spectrum plus; review diabetes regimen and CKD Review of patient status, including review of consultants reports, relevant laboratory and other test results, and medications completed Counseled on Diabetic diet, my plate method, [id number] minutes of moderate intensity exercise/week Mailed printed educational materials related to Diabetes management  Lab Results  Component Value Date   HGBA1C 7.5 (H) 02/11/2022          Interventions Today    Flowsheet Row Most Recent Value  Chronic Disease   Chronic disease during today's visit Diabetes, Chronic Kidney Disease/End Stage Renal Disease (ESRD), Other  [vascular dementia]  General Interventions   General Interventions Discussed/Reviewed General Interventions Discussed, General Interventions Reviewed  Exercise Interventions   Exercise Discussed/Reviewed Physical Activity  Physical Activity Discussed/Reviewed Physical Activity Discussed  Education Interventions   Education Provided Provided Printed Education  Mental Health Interventions   Mental Health Discussed/Reviewed Mental Health Discussed, Mental Health Reviewed, Anxiety, Other  [change in memory]  Nutrition Interventions   Nutrition Discussed/Reviewed Fluid intake, Decreasing sugar intake  Pharmacy Interventions   Pharmacy Dicussed/Reviewed Pharmacy Topics Discussed, Pharmacy Topics Reviewed, Medications and their functions, Referral to Pharmacist  Referral to Pharmacist Drug interaction/side effects  SDOH assessments and interventions completed:  No     Care Coordination  Interventions:  Yes, provided   Follow up plan: Referral made to [facility] to assist with medication review for diabetes and OTC CBD Follow up call scheduled for 04/24/22 '@1'$ :00 PM    Encounter Outcome:  Pt. Visit Completed

## 2022-04-03 ENCOUNTER — Ambulatory Visit: Payer: Medicare Other

## 2022-04-03 NOTE — Progress Notes (Signed)
Care Management & Coordination Services Pharmacy Note  04/03/2022 Name:  Donna Gilmore MRN:  [id number] DOB:  Mar 05, 1940  Summary: RN care coordinator asked about the following "Are you able to  discuss whether or not it is safe for her to use a CBD spectrum plus in liquid form to help with her anxiety? "  Patient reports that she is waiting for care coordinating nurse to follow up with her on physical therapy   Recommendations/Changes made from today's visit: Recommend patient not take CBD products.  Recommend increasing current Rybelsus to 14 mg tablet daily   Follow up plan: HC to complete patient assistance application portion with patient for Rybelsus medication  Collaborate with PCP to increase patients Rybelsus to 14 mg tablet once per day  Recommend patient is seen by PCP prior to her current visit due to daily use of CBD oil with her other medications.    Subjective: Donna Gilmore is an 82 y.o. year old female who is a primary patient of Donna Gilmore, [place].  The care coordination team was consulted for assistance with disease management and care coordination needs.    Engaged with patient by telephone for follow up visit.  Recent office visits: 03/11/2022 PCP OV   Recent consult visits: 02/06/2022   Hospital visits: 12/23/2021 ED visit     Objective:  Lab Results  Component Value Date   CREATININE 1.48 (H) 03/11/2022   BUN 14 03/11/2022   EGFR 35 (L) 03/11/2022   GFRNONAA 48 (L) 10/19/2019   GFRAA 56 (L) 10/19/2019   NA 139 03/11/2022   K 4.6 03/11/2022   CALCIUM 10.0 03/11/2022   CO2 20 03/11/2022   GLUCOSE 96 03/11/2022    Lab Results  Component Value Date/Time   HGBA1C 7.5 (H) 02/11/2022 12:03 PM   HGBA1C 7.3 (H) 10/04/2021 11:59 AM   MICROALBUR 30 07/26/2020 04:28 PM   MICROALBUR 10 06/30/2019 11:47 AM    Last diabetic Eye exam:  Lab Results  Component Value Date/Time   HMDIABEYEEXA No Retinopathy 10/19/2020 12:00 AM    Last diabetic Foot  exam: No results found for: "HMDIABFOOTEX"   Lab Results  Component Value Date   CHOL 173 02/11/2022   HDL 56 02/11/2022   LDLCALC 78 02/11/2022   TRIG 239 (H) 02/11/2022   CHOLHDL 3.1 02/11/2022       Latest Ref Rng & Units 10/04/2021   11:59 AM 09/25/2020   11:33 AM 04/18/2020    4:38 PM  Hepatic Function  Total Protein 6.0 - 8.5 g/dL 6.8  7.2  7.4   Albumin 3.7 - 4.7 g/dL 4.3  4.3  4.4   AST 0 - 40 IU/L '21  18  26   '$ ALT 0 - 32 IU/L '13  14  18   '$ Alk Phosphatase 44 - 121 IU/L 106  114  105   Total Bilirubin 0.0 - 1.2 mg/dL 0.3  0.2  0.2     Lab Results  Component Value Date/Time   TSH 3.220 03/11/2022 12:19 PM   TSH 3.910 10/04/2021 11:59 AM   FREET4 0.95 08/13/2013 08:15 AM       Latest Ref Rng & Units 03/11/2022   12:19 PM 10/04/2021   11:59 AM 09/25/2020   11:33 AM  CBC  WBC 3.4 - 10.8 x10E3/uL 8.5  6.6  7.4   Hemoglobin 11.1 - 15.9 g/dL 11.7  11.5  11.1   Hematocrit 34.0 - 46.6 % 35.9  36.4  34.1  Platelets 150 - 450 x10E3/uL 262  255  261     Lab Results  Component Value Date/Time   VD25OH 47.5 10/04/2021 11:59 AM   VITAMINB12 995 10/04/2021 11:59 AM   VITAMINB12 983 06/27/2020 04:33 PM    Clinical ASCVD: No  The ASCVD Risk score (Arnett DK, et al., 2019) failed to calculate for the following reasons:   The 2019 ASCVD risk score is only valid for ages 25 to 12   The patient has a prior MI or stroke diagnosis        03/11/2022   11:02 AM 02/11/2022   11:33 AM 02/11/2022   10:36 AM  Depression screen PHQ 2/9  Decreased Interest 3 3 0  Down, Depressed, Hopeless 3 3 0  PHQ - 2 Score 6 6 0  Altered sleeping 3 3   Tired, decreased energy 3 3   Change in appetite 3 3   Feeling bad or failure about yourself  3 0   Trouble concentrating 0 3   Moving slowly or fidgety/restless 3 0   Suicidal thoughts 0 0   PHQ-9 Score 21 18   Difficult doing work/chores Very difficult       Social History   Tobacco Use  Smoking Status Former   Packs/day: 0.30    Years: 30.00   Total pack years: 9.00   Types: Cigarettes   Quit date: 2019   Years since quitting: 5.1  Smokeless Tobacco Never   BP Readings from Last 3 Encounters:  03/11/22 128/70  02/11/22 130/72  02/06/22 130/78   Pulse Readings from Last 3 Encounters:  03/11/22 78  02/11/22 70  02/06/22 60   Wt Readings from Last 3 Encounters:  03/11/22 184 lb 12.8 oz (83.8 kg)  02/11/22 186 lb (84.4 kg)  01/15/22 188 lb 3.2 oz (85.4 kg)   BMI Readings from Last 3 Encounters:  03/11/22 30.75 kg/m  02/11/22 30.95 kg/m  01/15/22 31.32 kg/m    No Known Allergies  Medications Reviewed Today     Reviewed by Donna Brine, FNP (Family Nurse Practitioner) on 03/14/22 at 2138  Med List Status: <None>   Medication Order Taking? Sig Documenting Provider Last Dose Status Informant  0.9 %  sodium chloride infusion [id number]   [name], MD  Active   acetaminophen (TYLENOL) 500 MG tablet [id number] Yes Take 1 tablet (500 mg total) by mouth every 6 (six) hours as needed. Chase Picket, MD Taking Active   albuterol (VENTOLIN HFA) 108 (90 Base) MCG/ACT inhaler [id number] Yes Inhale 2 puffs into the lungs every 6 (six) hours as needed for wheezing or shortness of breath. Rigoberto Noel, MD Taking Active   atorvastatin (LIPITOR) 80 MG tablet [id number] Yes TAKE 1 TABLET BY MOUTH ONCE DAILY Donna Brine, FNP Taking Active   azelastine (OPTIVAR) 0.05 % ophthalmic solution [id number] Yes Place 1 drop into both eyes 2 (two) times daily. [name], MD Taking Active   Azelastine-Fluticasone [facility]) 137-50 MCG/ACT Elizbeth Squires [id number] Yes Place 1 spray into the nose in the morning and at bedtime. [name], MD Taking Active   Budeson-Glycopyrrol-Formoterol [facility] AEROSPHERE) 160-9-4.8 MCG/ACT Hollie Salk [id number]  USE 2 INHALATIONS BY MOUTH  DAILY Donna Brine, FNP  Active   cholecalciferol (VITAMIN D3) 25 MCG (1000 UNIT) tablet [id number] Yes Take 1,000 Units by mouth daily.  [provider] Taking Active   citalopram (CELEXA) 20 MG tablet [id number]  Take 0.5 tablets (10 mg total) by mouth daily. Donna Brine, FNP  Active   clopidogrel (PLAVIX) 75 MG tablet [id number] Yes TAKE 1 TABLET BY MOUTH DAILY Donna Brine, FNP Taking Active   cyclobenzaprine (FLEXERIL) 5 MG tablet [id number] Yes  [provider] Taking Active   diazepam (VALIUM) 5 MG tablet [id number]  Take 1 tablet (5 mg total) by mouth every 8 (eight) hours as needed for anxiety. Donna Brine, FNP  Active   diclofenac Sodium (VOLTAREN) 1 % GEL [id number] Yes Apply topically 4 (four) times daily. [provider] Taking Active   donepezil (ARICEPT) 10 MG tablet [id number] Yes Take 1 tablet (10 mg total) by mouth at bedtime. Donna Brine, FNP Taking Active   ezetimibe (ZETIA) 10 MG tablet [id number] Yes Take 1 tablet (10 mg total) by mouth daily. [name], MD Taking Active   fluorometholone (FML) 0.1 % ophthalmic suspension [id number] Yes 1 drop 4 (four) times daily. [provider] Taking Active   gabapentin (NEURONTIN) 100 MG capsule [id number] Yes TAKE 1 CAPSULE BY MOUTH 3  TIMES DAILY AS NEEDED Donna Brine, FNP Taking Active   glucose blood (ONETOUCH ULTRA) test strip [id number] Yes USE WITH METER TO CHECK BLOOD SUGAR BEFORE BREAKFAST AND BEFORE Andrey Spearman, Doreene Burke, FNP Taking Active   hydrochlorothiazide (HYDRODIURIL) 12.5 MG tablet [id number] Yes  [provider] Taking Active   levocetirizine (XYZAL) 5 MG tablet [id number] Yes Take 1 tablet (5 mg total) by mouth every evening. Donna Brine, FNP Taking Active   meclizine (ANTIVERT) 25 MG tablet [id number] Yes TAKE 1 TABLET BY MOUTH 2  TIMES EVERY DAY AS NEEDED Donna Brine, FNP Taking Active   Melatonin 5 MG CAPS [id number] Yes Take 1 capsule (5 mg total) by mouth daily after supper. Dohmeier, Asencion Partridge, MD Taking Active   olmesartan-hydrochlorothiazide [facility] HCT) 40-12.5 MG tablet [id number] Yes TAKE 1 TABLET BY  MOUTH DAILY Donna Brine, FNP Taking Active   pimecrolimus (ELIDEL) 1 % cream [id number] Yes Apply thin layer to the skin as needed twice a day for itching or rash [name], MD Taking Active   Probiotic Product (PROBIOTIC-10 PO) [id number] Yes Take by mouth. Take one tablet daily [provider] Taking Active   Semaglutide (RYBELSUS) 7 MG TABS [id number] Yes Take 1 tablet by mouth daily. Donna Brine, FNP Taking Active   vitamin C (ASCORBIC ACID) 500 MG tablet [id number] Yes Take 500 mg by mouth daily. [provider] Taking Active Self  VITAMIN D PO [id number] Yes Take 1 tablet by mouth daily. [provider] Taking Active             SDOH:  (Social Determinants of Health) assessments and interventions performed: No SDOH Interventions    Flowsheet Row Office Visit from 02/11/2022 in [facility] Office Visit from 10/04/2021 in [facility] Office Visit from 09/04/2021 in [facility] Office Visit from 05/29/2021 in [facility] Chronic Care Management from 01/24/2021 in [facility] Chronic Care Management from 06/28/2020 in [facility]  SDOH Interventions        Food Insecurity Interventions -- -- -- -- Intervention Not Indicated --  Housing Interventions -- -- -- -- Intervention Not Indicated --  Transportation Interventions -- -- -- -- Intervention Not Indicated --  Depression Interventions/Treatment  Medication PHQ2-9 Score <4 Follow-up Not Indicated PHQ2-9 Score <4 Follow-up Not Indicated PHQ2-9 Score <4 Follow-up Not Indicated -- --  Financial Strain Interventions -- -- -- -- -- --  [  Patients medication is expensive]       Medication Assistance: None required.  Patient affirms current coverage meets needs.  Medication Access: Within the past 30  days, how often has patient missed a dose of medication? 0 Is a pillbox or other method used to improve adherence? Yes  Factors that may affect medication adherence? lack of understanding of disease management Are meds synced by current pharmacy? No  Are meds delivered by current pharmacy? No  Does patient experience delays in picking up medications due to transportation concerns? No   [facility] Services Reviewed: Is patient disadvantaged to use [facility]?: No  Current Rx insurance plan: [employer]  Name and location of Current pharmacy:  [place], [address] [place] [address] Phone: [phone] Fax: [phone]  [facility] ([place], [place] [facility] [address] [place] [address] Phone: [phone] Fax: [phone]  [place], [address] [place] [address] Phone: [phone] Fax: [place], [place] [place] [address] Phone: [phone] Fax: [phone]  [facility] services reviewed with patient today?: No  Patient requests to transfer care to [facility]?: No  Reason patient declined to change pharmacies: Disadvantaged due to insurance/mail order  Compliance/Adherence/Medication fill history: Care Gaps: COVID-19 Vaccine  Ophthalmology Exam  Medicare Annual Wellness vist   Star-Rating Drugs: Atorvastatin 80 mg tablet  Olmesartan-Hydrochlorothiazide 40-12.5 mg tablet     Assessment/Plan   Diabetes (A1c goal <8%) -Controlled -Current medications: Rybelsus 7 mg tablet once per day Appropriate, Effective, Safe, Accessible -Current home glucose readings fasting glucose: 99, 107, 3/4-140 -Denies hypoglycemic/hyperglycemic symptoms -Current meal  patterns:  breakfast: oatmeal with fruit and booster   lunch: fruit: apples, grapes and a couple of cookies  dinner: fish, baked potato and kale  snacks: ice cream drinks: drinking water at least 64 ounces per day, she has a jug labeled that she fills everyday -Current exercise: patient is not currently exercising as often  -Educated on A1c and blood sugar goals; Prevention and management of hypoglycemic episodes; -Patient reports that at this time she is okay with increasing the Rybelsus to 14 mg tablet per day and  -Counseled to check feet daily and get yearly eye exams -Recommended to continue current medication  Anxiety (Goal: Reduce signs and symptoms of Anxiety) -Uncontrolled -Current treatment: Citalopram 20 mg tablet taking 1/2 tablet daily Appropriate, Query effective Diazepam 5 mg tablet - take 1 tablet by mouth every 8 hours as needed for anxiety Appropriate, Query effective CBD spectrum plus oil - two drops a bedtime Query Appropriate -GAD7: unable to complete with the patient today  -At this time the patient has  -Educated on Benefits of medication for symptom control Benefits of cognitive-behavioral therapy with or without medication  -Patient reports that she has done her research and she believes that CBD drops have a positive impact on her  anxiety, BS and BP.  -Discussed in great detail the harm that these OTC products can cause and concern about possible interactions and my recommendation for patient to be seen by PCP prior to May visit  -Counseled on how OTC supplements and vitamins are not regulated by the FDA and how these OTC medications can also interact with her prescription  medications.  Collaborated with PCP to discuss patients use of CBD oil OTC product, made PCP aware that at this time the patient is not going to stop using the OTC product.   Recommended patient be seen by PCP prior to visit in May due to her use of the CBD oil daily and possibly drug  interactions.    Orlando Penner, CPP, PharmD Clinical Pharmacist Practitioner [facility] [phone]

## 2022-04-09 ENCOUNTER — Ambulatory Visit (INDEPENDENT_AMBULATORY_CARE_PROVIDER_SITE_OTHER): Payer: Medicare Other | Admitting: Podiatry

## 2022-04-09 ENCOUNTER — Encounter: Payer: Self-pay | Admitting: Podiatry

## 2022-04-09 DIAGNOSIS — B351 Tinea unguium: Secondary | ICD-10-CM

## 2022-04-09 DIAGNOSIS — M79675 Pain in left toe(s): Secondary | ICD-10-CM

## 2022-04-09 DIAGNOSIS — M2141 Flat foot [pes planus] (acquired), right foot: Secondary | ICD-10-CM | POA: Diagnosis not present

## 2022-04-09 DIAGNOSIS — M79674 Pain in right toe(s): Secondary | ICD-10-CM | POA: Diagnosis not present

## 2022-04-09 DIAGNOSIS — M2142 Flat foot [pes planus] (acquired), left foot: Secondary | ICD-10-CM

## 2022-04-09 DIAGNOSIS — E1142 Type 2 diabetes mellitus with diabetic polyneuropathy: Secondary | ICD-10-CM | POA: Diagnosis not present

## 2022-04-09 NOTE — Progress Notes (Signed)
  Subjective:  Patient ID: Donna Gilmore, female    DOB: December 03, 1940,  MRN: [id number]  Donna Gilmore presents to clinic today for at risk foot care with history of diabetic neuropathy and painful thick toenails that are difficult to trim. Pain interferes with ambulation. Aggravating factors include wearing enclosed shoe gear. Pain is relieved with periodic professional debridement.   She is requesting diabetic shoes on today's visit. Chief Complaint  Patient presents with   Nail Problem    DFC BS-102 A1C-do not remember PCP-Moore, Janece PCP VST-3 months ago   New problem(s): None.   PCP is [name], FNP.  No Known Allergies  Review of Systems: Negative except as noted in the HPI.  Objective: No changes noted in today's physical examination. There were no vitals filed for this visit. Donna Gilmore is a pleasant 82 y.o. female in NAD. AAO x 3.  Vascular Examination: CFT <3 seconds b/l LE. Palpable DP pulse(s) b/l LE. Faintly palpable PT pulse(s) b/l LE. Pedal hair absent. No pain with calf compression b/l. Lower extremity skin temperature gradient within normal limits. Trace edema noted BLE. No ischemia or gangrene noted b/l LE. No cyanosis or clubbing noted b/l LE.Marland Kitchen  Dermatological Examination: Pedal skin with normal turgor, texture and tone b/l. No open wounds. No interdigital macerations b/l.   Toenails 1-5  left, 2-5 right thickened, discolored, dystrophic with subungual debris. There is pain on palpation to dorsal aspect of nailplates.   Anonychia noted R hallux. Nailbed(s) epithelialized.   Neurological Examination: Protective sensation intact with 10 gram monofilament b/l LE. Vibratory sensation intact b/l LE. Pt has subjective symptoms of neuropathy.  Musculoskeletal Examination: Muscle strength 5/5 to all LE muscle groups b/l. No pain, crepitus or joint limitation noted with ROM bilateral LE. Pes planus b/l.  Radiographs: None  Assessment/Plan: 1.  Pain due to onychomycosis of toenails of both feet   2. Pes planus of both feet   3. Type 2 diabetes mellitus with diabetic polyneuropathy, without long-term current use of insulin ([place])     -Patient was evaluated and treated. All patient's and/or POA's questions/concerns answered on today's visit. -Discussed diabetic shoe benefit available based on patient's diagnoses. Patient/POA would like to proceed. Order entered for one pair extra depth shoes and 3 pair total contact insoles. Patient qualifies based on diagnoses. -Toenails were debrided in length and girth 2-5 bilaterally with sterile nail nippers and dremel without iatrogenic bleeding.  -Patient/POA to call should there be question/concern in the interim.   Return in about 3 months (around 07/10/2022).  [name], DPM

## 2022-04-10 ENCOUNTER — Ambulatory Visit (INDEPENDENT_AMBULATORY_CARE_PROVIDER_SITE_OTHER): Payer: Medicare Other

## 2022-04-10 VITALS — Ht 63.0 in | Wt 188.0 lb

## 2022-04-10 DIAGNOSIS — Z0000 Encounter for general adult medical examination without abnormal findings: Secondary | ICD-10-CM | POA: Diagnosis not present

## 2022-04-10 NOTE — Patient Instructions (Signed)
Donna Gilmore , Thank you for taking time to come for your Medicare Wellness Visit. I appreciate your ongoing commitment to your health goals. Please review the following plan we discussed and let me know if I can assist you in the future.   These are the goals we discussed:  Goals      Manage My Medicine     Timeframe:  Long-Range Goal Priority:  High Start Date:                             Expected End Date:                       Follow Up Date 09/25/2021  In Progress:  - call for medicine refill 2 or 3 days before it runs out - call if I am sick and can't take my medicine - keep a list of all the medicines I take; vitamins and herbals too - use an alarm clock or phone to remind me to take my medicine    Why is this important?   These steps will help you keep on track with your medicines.        Patient Stated     To lose weight     Patient Stated     06/30/2019, wants to lose stomach by decreasing sweet intake     Patient Stated     07/26/2020, lose belly fat and wants to go back to exercising     Patient Stated     04/05/2021, wants to build up energy     Patient Stated     04/10/2022, wants to lose 20 pounds     To improve fatigue and strengthening     Care Coordination Interventions: Evaluation of current treatment plan related to worsening fatigue and patient's adherence to plan as established by provider Reviewed and discussed with patient PCP referral placed on 03/11/22 for in home PT to help patient with strengthening and endurance Determined patient has yet to be contacted by a home health agency Determined the referral is still in pending status Collaborated with Andre Lefort, referral coordinator regarding referral status, discussed having referral sent to [facility] as patient states this provider is listed on her U card as an in-network provider Educated patient about the importance of staying well hydrated with water and balancing her activity with rest     To  work on lowering A1c <7.0 %     Care Coordination Interventions: Provided education to patient about basic DM disease process Reviewed medications with patient and discussed importance of medication adherence Provided patient with written educational materials related to hypo and hyperglycemia and importance of correct treatment Advised patient, providing education and rationale, to check cbg daily before breakfast and at bedtime and record, calling PCP for findings outside established parameters Referral made to pharmacy team for assistance with medication review of CBD Spectrum plus; review diabetes regimen and CKD Review of patient status, including review of consultants reports, relevant laboratory and other test results, and medications completed Counseled on Diabetic diet, my plate method, [id number] minutes of moderate intensity exercise/week Mailed printed educational materials related to Diabetes management  Lab Results  Component Value Date   HGBA1C 7.5 (H) 02/11/2022              This is a list of the screening recommended for you and due dates:  Health Maintenance  Topic Date Due  Mammogram  07/06/2019   COVID-19 Vaccine (6 - 2023-24 season) 09/28/2021   Eye exam for diabetics  10/19/2021   Complete foot exam   06/12/2022   Hemoglobin A1C  08/12/2022   Yearly kidney health urinalysis for diabetes  10/05/2022   Yearly kidney function blood test for diabetes  03/12/2023   Medicare Annual Wellness Visit  04/10/2023   DTaP/Tdap/Td vaccine (2 - Td or Tdap) 06/24/2028   Pneumonia Vaccine  Completed   Flu Shot  Completed   DEXA scan (bone density measurement)  Completed   Zoster (Shingles) Vaccine  Completed   HPV Vaccine  Aged Out    Advanced directives: Please bring a copy of your POA (Power of [place]) and/or Living Will to your next appointment.   Conditions/risks identified: none  Next appointment: Follow up in one year for your annual wellness visit    Preventive  Care 65 Years and Older, Female Preventive care refers to lifestyle choices and visits with your health care provider that can promote health and wellness. What does preventive care include? A yearly physical exam. This is also called an annual well check. Dental exams once or twice a year. Routine eye exams. Ask your health care provider how often you should have your eyes checked. Personal lifestyle choices, including: Daily care of your teeth and gums. Regular physical activity. Eating a healthy diet. Avoiding tobacco and drug use. Limiting alcohol use. Practicing safe sex. Taking low-dose aspirin every day. Taking vitamin and mineral supplements as recommended by your health care provider. What happens during an annual well check? The services and screenings done by your health care provider during your annual well check will depend on your age, overall health, lifestyle risk factors, and family history of disease. Counseling  Your health care provider may ask you questions about your: Alcohol use. Tobacco use. Drug use. Emotional well-being. Home and relationship well-being. Sexual activity. Eating habits. History of falls. Memory and ability to understand (cognition). Work and work Statistician. Reproductive health. Screening  You may have the following tests or measurements: Height, weight, and BMI. Blood pressure. Lipid and cholesterol levels. These may be checked every 5 years, or more frequently if you are over 67 years old. Skin check. Lung cancer screening. You may have this screening every year starting at age 83 if you have a 30-pack-year history of smoking and currently smoke or have quit within the past 15 years. Fecal occult blood test (FOBT) of the stool. You may have this test every year starting at age 70. Flexible sigmoidoscopy or colonoscopy. You may have a sigmoidoscopy every 5 years or a colonoscopy every 10 years starting at age 39. Hepatitis C blood  test. Hepatitis B blood test. Sexually transmitted disease (STD) testing. Diabetes screening. This is done by checking your blood sugar (glucose) after you have not eaten for a while (fasting). You may have this done every 1-3 years. Bone density scan. This is done to screen for osteoporosis. You may have this done starting at age 76. Mammogram. This may be done every 1-2 years. Talk to your health care provider about how often you should have regular mammograms. Talk with your health care provider about your test results, treatment options, and if necessary, the need for more tests. Vaccines  Your health care provider may recommend certain vaccines, such as: Influenza vaccine. This is recommended every year. Tetanus, diphtheria, and acellular pertussis (Tdap, Td) vaccine. You may need a Td booster every 10 years. Zoster vaccine. You may  need this after age 74. Pneumococcal 13-valent conjugate (PCV13) vaccine. One dose is recommended after age 47. Pneumococcal polysaccharide (PPSV23) vaccine. One dose is recommended after age 61. Talk to your health care provider about which screenings and vaccines you need and how often you need them. This information is not intended to replace advice given to you by your health care provider. Make sure you discuss any questions you have with your health care provider. Document Released: 02/10/2015 Document Revised: 10/04/2015 Document Reviewed: 11/15/2014 Elsevier Interactive Patient Education  2017 [place] Prevention in the Home Falls can cause injuries. They can happen to people of all ages. There are many things you can do to make your home safe and to help prevent falls. What can I do on the outside of my home? Regularly fix the edges of walkways and driveways and fix any cracks. Remove anything that might make you trip as you walk through a door, such as a raised step or threshold. Trim any bushes or trees on the path to your home. Use  bright outdoor lighting. Clear any walking paths of anything that might make someone trip, such as rocks or tools. Regularly check to see if handrails are loose or broken. Make sure that both sides of any steps have handrails. Any raised decks and porches should have guardrails on the edges. Have any leaves, snow, or ice cleared regularly. Use sand or salt on walking paths during winter. Clean up any spills in your garage right away. This includes oil or grease spills. What can I do in the bathroom? Use night lights. Install grab bars by the toilet and in the tub and shower. Do not use towel bars as grab bars. Use non-skid mats or decals in the tub or shower. If you need to sit down in the shower, use a plastic, non-slip stool. Keep the floor dry. Clean up any water that spills on the floor as soon as it happens. Remove soap buildup in the tub or shower regularly. Attach bath mats securely with double-sided non-slip rug tape. Do not have throw rugs and other things on the floor that can make you trip. What can I do in the bedroom? Use night lights. Make sure that you have a light by your bed that is easy to reach. Do not use any sheets or blankets that are too big for your bed. They should not hang down onto the floor. Have a firm chair that has side arms. You can use this for support while you get dressed. Do not have throw rugs and other things on the floor that can make you trip. What can I do in the kitchen? Clean up any spills right away. Avoid walking on wet floors. Keep items that you use a lot in easy-to-reach places. If you need to reach something above you, use a strong step stool that has a grab bar. Keep electrical cords out of the way. Do not use floor polish or wax that makes floors slippery. If you must use wax, use non-skid floor wax. Do not have throw rugs and other things on the floor that can make you trip. What can I do with my stairs? Do not leave any items on the  stairs. Make sure that there are handrails on both sides of the stairs and use them. Fix handrails that are broken or loose. Make sure that handrails are as long as the stairways. Check any carpeting to make sure that it is firmly attached  to the stairs. Fix any carpet that is loose or worn. Avoid having throw rugs at the top or bottom of the stairs. If you do have throw rugs, attach them to the floor with carpet tape. Make sure that you have a light switch at the top of the stairs and the bottom of the stairs. If you do not have them, ask someone to add them for you. What else can I do to help prevent falls? Wear shoes that: Do not have high heels. Have rubber bottoms. Are comfortable and fit you well. Are closed at the toe. Do not wear sandals. If you use a stepladder: Make sure that it is fully opened. Do not climb a closed stepladder. Make sure that both sides of the stepladder are locked into place. Ask someone to hold it for you, if possible. Clearly mark and make sure that you can see: Any grab bars or handrails. First and last steps. Where the edge of each step is. Use tools that help you move around (mobility aids) if they are needed. These include: Canes. Walkers. Scooters. Crutches. Turn on the lights when you go into a dark area. Replace any light bulbs as soon as they burn out. Set up your furniture so you have a clear path. Avoid moving your furniture around. If any of your floors are uneven, fix them. If there are any pets around you, be aware of where they are. Review your medicines with your doctor. Some medicines can make you feel dizzy. This can increase your chance of falling. Ask your doctor what other things that you can do to help prevent falls. This information is not intended to replace advice given to you by your health care provider. Make sure you discuss any questions you have with your health care provider. Document Released: 11/10/2008 Document Revised:  06/22/2015 Document Reviewed: 02/18/2014 Elsevier Interactive Patient Education  2017 [employer].

## 2022-04-10 NOTE — Progress Notes (Signed)
I connected with  Donna Gilmore on 04/10/22 by a audio enabled telemedicine application and verified that I am speaking with the correct person using two identifiers.  Patient Location: Home  Provider Location: Home Office  I discussed the limitations of evaluation and management by telemedicine. The patient expressed understanding and agreed to proceed.  Subjective:   Donna Gilmore is a 82 y.o. female who presents for Medicare Annual (Subsequent) preventive examination.  Review of Systems     Cardiac Risk Factors include: advanced age (>26mn, >>18women);diabetes mellitus;dyslipidemia;hypertension;obesity (BMI >30kg/m2)     Objective:    Today's Vitals   04/10/22 1145  Weight: 188 lb (85.3 kg)  Height: '5\' 3"'$  (1.6 m)   Body mass index is 33.3 kg/m.     04/10/2022   12:00 PM 04/05/2021   12:15 PM 07/26/2020    3:55 PM 04/21/2020   10:52 AM 06/30/2019    9:53 AM 06/24/2018    2:21 PM 10/22/2017    7:59 PM  Advanced Directives  Does Patient Have a Medical Advance Directive? Yes Yes Yes Yes Yes Yes No  Type of Advance Directive Living will [place]leep[place] EyeLiving will [place] will  [place] will Living will;Healthcare Power of [place] Chart?  No - copy requested No - copy requested  No - copy requested No - copy requested     Current Medications (verified) Outpatient Encounter Medications as of 04/10/2022  Medication Sig   acetaminophen (TYLENOL) 500 MG tablet Take 1 tablet (500 mg total) by mouth every 6 (six) hours as needed.   albuterol (VENTOLIN HFA) 108 (90 Base) MCG/ACT inhaler Inhale 2 puffs into the lungs every 6 (six) hours as needed for wheezing or shortness of breath.   atorvastatin (LIPITOR) 80 MG tablet TAKE 1 TABLET BY MOUTH ONCE DAILY   Azelastine-Fluticasone (DYMISTA) 137-50 MCG/ACT SUSP Place 1 spray into the nose in the morning and at bedtime.    Budeson-Glycopyrrol-Formoterol (BREZTRI AEROSPHERE) 160-9-4.8 MCG/ACT AERO USE 2 INHALATIONS BY MOUTH  DAILY   cholecalciferol (VITAMIN D3) 25 MCG (1000 UNIT) tablet Take 1,000 Units by mouth daily.   citalopram (CELEXA) 20 MG tablet Take 0.5 tablets (10 mg total) by mouth daily.   clopidogrel (PLAVIX) 75 MG tablet TAKE 1 TABLET BY MOUTH DAILY   cyclobenzaprine (FLEXERIL) 5 MG tablet    diazepam (VALIUM) 5 MG tablet Take 1 tablet (5 mg total) by mouth every 8 (eight) hours as needed for anxiety.   diclofenac Sodium (VOLTAREN) 1 % GEL Apply topically 4 (four) times daily.   donepezil (ARICEPT) 10 MG tablet Take 1 tablet (10 mg total) by mouth at bedtime.   ezetimibe (ZETIA) 10 MG tablet Take 1 tablet (10 mg total) by mouth daily.   gabapentin (NEURONTIN) 100 MG capsule TAKE 1 CAPSULE BY MOUTH 3  TIMES DAILY AS NEEDED   glucose blood (ONETOUCH ULTRA) test strip USE WITH METER TO CHECK BLOOD SUGAR BEFORE BREAKFAST AND BEFORE DINNER   hydrochlorothiazide (HYDRODIURIL) 12.5 MG tablet    levocetirizine (XYZAL) 5 MG tablet Take 1 tablet (5 mg total) by mouth every evening.   meclizine (ANTIVERT) 25 MG tablet TAKE 1 TABLET BY MOUTH 2  TIMES EVERY DAY AS NEEDED   Melatonin 5 MG CAPS Take 1 capsule (5 mg total) by mouth daily after supper.   olmesartan-hydrochlorothiazide (BENICAR HCT) 40-12.5 MG tablet TAKE 1 TABLET BY MOUTH DAILY   pimecrolimus (  ELIDEL) 1 % cream Apply thin layer to the skin as needed twice a day for itching or rash   Probiotic Product (PROBIOTIC-10 PO) Take by mouth. Take one tablet daily   Semaglutide (RYBELSUS) 7 MG TABS Take 1 tablet by mouth daily.   vitamin C (ASCORBIC ACID) 500 MG tablet Take 500 mg by mouth daily.   VITAMIN D PO Take 1 tablet by mouth daily.   azelastine (OPTIVAR) 0.05 % ophthalmic solution Place 1 drop into both eyes 2 (two) times daily. (Patient not taking: Reported on 03/26/2022)   fluorometholone (FML) 0.1 % ophthalmic suspension 1 drop 4 (four) times daily.  (Patient not taking: Reported on 03/26/2022)   Facility-Administered Encounter Medications as of 04/10/2022  Medication   0.9 %  sodium chloride infusion    Allergies (verified) Patient has no known allergies.   History: Past Medical History:  Diagnosis Date   Benign paroxysmal positional vertigo 08/26/2012   CVA (cerebral infarction) 08/25/2012   Diabetes mellitus without complication ([place])    Dyslipidemia 08/25/2012   Essential hypertension, benign 08/25/2012   Hyperlipidemia    Hypertension    Meralgia paraesthetica, left 01/12/2018   Stroke [facility])    History reviewed. No pertinent surgical history. Family History  Problem Relation Age of Onset   Kidney failure Mother    Diabetes Mother    Alzheimer's disease Mother    Alzheimer's disease Father    Prostate cancer Father    Diabetes Maternal Grandmother    Diabetes Maternal Grandfather    Alzheimer's disease Paternal Grandmother    Alzheimer's disease Paternal Grandfather    Prostate cancer Paternal Uncle    Social History   Socioeconomic History   Marital status: Divorced    Spouse name: Not on file   Number of children: 4   Years of education: Not on file   Highest education level: Not on file  Occupational History   Occupation: retired  Tobacco Use   Smoking status: Former    Packs/day: 0.30    Years: 30.00    Total pack years: 9.00    Types: Cigarettes    Quit date: 2019    Years since quitting: 5.2   Smokeless tobacco: Never  Vaping Use   Vaping Use: Never used  Substance and Sexual Activity   Alcohol use: Not Currently    Comment: occasional   Drug use: No   Sexual activity: Not Currently  Other Topics Concern   Not on file  Social History Narrative   Not on file   Social Determinants of Health   Financial Resource Strain: Low Risk  (04/10/2022)   Overall Financial Resource Strain (CARDIA)    Difficulty of Paying Living Expenses: Not hard at all  Food Insecurity: No Food Insecurity (04/10/2022)    Hunger Vital Sign    Worried About Running Out of Food in the Last Year: Never true    Ran Out of Food in the Last Year: Never true  Transportation Needs: No Transportation Needs (04/10/2022)   PRAPARE - Hydrologist (Medical): No    Lack of Transportation (Non-Medical): No  Physical Activity: Inactive (04/10/2022)   Exercise Vital Sign    Days of Exercise per Week: 0 days    Minutes of Exercise per Session: 0 min  Stress: No Stress Concern Present (04/10/2022)   [place]    Feeling of Stress : Not at all  Social Connections: Not on file  Tobacco Counseling Counseling given: Not Answered   Clinical Intake:  Pre-visit preparation completed: Yes  Pain : No/denies pain     Nutritional Status: BMI > 30  Obese Nutritional Risks: Nausea/ vomitting/ diarrhea (diarrhea for week) Diabetes: Yes  How often do you need to have someone help you when you read instructions, pamphlets, or other written materials from your doctor or pharmacy?: 1 - Never  Diabetic? Yes Nutrition Risk Assessment:  Has the patient had any N/V/D within the last 2 months?  Yes  Does the patient have any non-healing wounds?  No  Has the patient had any unintentional weight loss or weight gain?  No   Diabetes:  Is the patient diabetic?  Yes  If diabetic, was a CBG obtained today?  No  Did the patient bring in their glucometer from home?  No  How often do you monitor your CBG's? twice.   Financial Strains and Diabetes Management:  Are you having any financial strains with the device, your supplies or your medication? No .  Does the patient want to be seen by Chronic Care Management for management of their diabetes?  No  Would the patient like to be referred to a Nutritionist or for Diabetic Management?  No   Diabetic Exams:  Diabetic Eye Exam: Overdue for diabetic eye exam. Pt has been advised about the  importance in completing this exam. Patient advised to call and schedule an eye exam. Diabetic Foot Exam: Completed 04/09/2022   Interpreter Needed?: No  Information entered by :: NAllen LPN   Activities of Daily Living    04/10/2022   12:02 PM  In your present state of health, do you have any difficulty performing the following activities:  Hearing? 1  Vision? 1  Comment sometimes  Difficulty concentrating or making decisions? 1  Walking or climbing stairs? 1  Dressing or bathing? 0  Doing errands, shopping? 1  Preparing Food and eating ? N  Using the Toilet? N  In the past six months, have you accidently leaked urine? N  Do you have problems with loss of bowel control? N  Managing your Medications? Y  Comment family sets up meds  Managing your Finances? N  Housekeeping or managing your Housekeeping? N    Patient Care Team: [name], FNP as PCP - General (General Practice) [name], FNP (General Practice) Little, [name], RN as [place] as [place] any recent [place] you may have received from other than [facility] providers in the past year (date may be approximate).     Assessment:   This is a routine wellness examination for Jay.  Hearing/Vision screen Vision Screening - Comments:: No regular eye exams  Dietary issues and exercise activities discussed: Current Exercise Habits: The patient does not participate in regular exercise at present   Goals Addressed             This Visit's Progress    Patient Stated       04/10/2022, wants to lose 20 pounds       Depression Screen    04/10/2022   12:02 PM 03/11/2022   11:02 AM 02/11/2022   11:33 AM 02/11/2022   10:36 AM 10/04/2021   10:41 AM 04/05/2021   12:18 PM 07/26/2020    3:56 PM  PHQ 2/9 Scores  PHQ - 2 Score 0 6 6 0 0 0 0  PHQ- 9 Score  21 18  Fall Risk    04/10/2022   12:01 PM 03/11/2022    11:02 AM 02/11/2022   10:35 AM 10/04/2021   10:41 AM 04/05/2021   12:17 PM  Fall Risk   Falls in the past year? 0 0 0 0 0  Number falls in past yr: 0 0 0 0   Injury with Fall? 0 0 0 0   Risk for fall due to : Medication side effect No Fall Risks No Fall Risks No Fall Risks Medication side effect  Follow up Falls prevention discussed;Education provided;Falls evaluation completed Falls evaluation completed Falls evaluation completed  Falls evaluation completed;Education provided;Falls prevention discussed    FALL RISK PREVENTION PERTAINING TO THE HOME:  Any stairs in or around the home? No  If so, are there any without handrails? N/a Home free of loose throw rugs in walkways, pet beds, electrical cords, etc? Yes  Adequate lighting in your home to reduce risk of falls? Yes   ASSISTIVE DEVICES UTILIZED TO PREVENT FALLS:  Life alert? No  Use of a cane, walker or w/c? Yes  Grab bars in the bathroom? Yes  Shower chair or bench in shower? Yes  Elevated toilet seat or a handicapped toilet? Yes   TIMED UP AND GO:  Was the test performed? No .       Cognitive Function:  6 CIT not administered. Patient has diagnosis of cognitive impairment.        07/26/2020    3:59 PM 06/30/2019   10:01 AM 06/24/2018    2:33 PM  6CIT Screen  What Year? 0 points 0 points 0 points  What month? 0 points 0 points 0 points  What time? 0 points 0 points 0 points  Count back from 20 0 points 0 points 0 points  Months in reverse 2 points 0 points 0 points  Repeat phrase 4 points 0 points 0 points  Total Score 6 points 0 points 0 points    Immunizations Immunization History  Administered Date(s) Administered   Fluad Quad(high Dose 65+) 10/19/2019, 01/16/2021, 10/04/2021   Influenza, High Dose Seasonal PF 11/05/2017   Influenza-Unspecified 10/06/2018   Moderna Sars-Covid-2 Vaccination 02/23/2019, 03/23/2019, 11/23/2019, 09/14/2020   Pfizer Covid-19 Vaccine Bivalent Booster 49yr & up 04/24/2021    Pneumococcal Conjugate-13 07/07/2018   Pneumococcal Polysaccharide-23 05/16/2020   Tdap 06/25/2018   Zoster Recombinat (Shingrix) 04/24/2021, 08/13/2021    TDAP status: Up to date  Flu Vaccine status: Up to date  Pneumococcal vaccine status: Up to date  Covid-19 vaccine status: Completed vaccines  Qualifies for Shingles Vaccine? Yes   Zostavax completed Yes   Shingrix Completed?: Yes  Screening Tests Health Maintenance  Topic Date Due   MAMMOGRAM  07/06/2019   COVID-19 Vaccine (6 - 2023-24 season) 09/28/2021   OPHTHALMOLOGY EXAM  10/19/2021   Medicare Annual Wellness (AWV)  04/06/2022   FOOT EXAM  06/12/2022   HEMOGLOBIN A1C  08/12/2022   Diabetic kidney evaluation - Urine ACR  10/05/2022   Diabetic kidney evaluation - eGFR measurement  03/12/2023   DTaP/Tdap/Td (2 - Td or Tdap) 06/24/2028   Pneumonia Vaccine 82 Years old  Completed   INFLUENZA VACCINE  Completed   DEXA SCAN  Completed   Zoster Vaccines- Shingrix  Completed   HPV VACCINES  Aged Out    Health Maintenance  Health Maintenance Due  Topic Date Due   MAMMOGRAM  07/06/2019   COVID-19 Vaccine (6 - 2023-24 season) 09/28/2021   OPHTHALMOLOGY EXAM  10/19/2021   Medicare  Annual Wellness (AWV)  04/06/2022    Colorectal cancer screening: No longer required.   Mammogram status: scheduled for 04/22/2022  Bone Density status: Completed 02/14/2015.   Lung Cancer Screening: (Low Dose CT Chest recommended if Age 29-80 years, 30 pack-year currently smoking OR have quit w/in 15years.) does not qualify.   Lung Cancer Screening Referral: no  Additional Screening:  Hepatitis C Screening: does not qualify;   Vision Screening: Recommended annual ophthalmology exams for early detection of glaucoma and other disorders of the eye. Is the patient up to date with their annual eye exam?  No  Who is the provider or what is the name of the office in which the patient attends annual eye exams? Can't remember name If pt is  not established with a provider, would they like to be referred to a provider to establish care? No .   Dental Screening: Recommended annual dental exams for proper oral hygiene  Community Resource Referral / Chronic Care Management: CRR required this visit?  No   CCM required this visit?  No      Plan:     I have personally reviewed and noted the following in the patient's chart:   Medical and social history Use of alcohol, tobacco or illicit drugs  Current medications and supplements including opioid prescriptions. Patient is not currently taking opioid prescriptions. Functional ability and status Nutritional status Physical activity Advanced directives List of other physicians Hospitalizations, surgeries, and ER visits in previous 12 months Vitals Screenings to include cognitive, depression, and falls Referrals and appointments  In addition, I have reviewed and discussed with patient certain preventive protocols, quality metrics, and best practice recommendations. A written personalized care plan for preventive services as well as general preventive health recommendations were provided to patient.     [name], LPN   [id number]   Nurse Notes: none  Due to this being a virtual visit, the after visit summary with patients personalized plan was offered to patient via mail or my-chart. Patient would like to access on my-chart

## 2022-04-12 NOTE — Progress Notes (Incomplete)
  Subjective:  Patient ID: Donna Gilmore, female    DOB: 07/22/1940,  MRN: [id number]  Donna Gilmore presents to clinic today for at risk foot care with history of diabetic neuropathy and painful thick toenails that are difficult to trim. Pain interferes with ambulation. Aggravating factors include wearing enclosed shoe gear. Pain is relieved with periodic professional debridement.  Chief Complaint  Patient presents with  . Nail Problem    DFC BS-102 A1C-do not remember PCP-Moore, Janece PCP VST-3 months ago   New problem(s): None.   PCP is [name], FNP.  No Known Allergies  Review of Systems: Negative except as noted in the HPI.  Objective: No changes noted in today's physical examination. There were no vitals filed for this visit. Donna Gilmore is a pleasant 82 y.o. female in NAD. AAO x 3.  Vascular Examination: CFT <3 seconds b/l LE. Palpable DP pulse(s) b/l LE. Faintly palpable PT pulse(s) b/l LE. Pedal hair absent. No pain with calf compression b/l. Lower extremity skin temperature gradient within normal limits. Trace edema noted BLE. No ischemia or gangrene noted b/l LE. No cyanosis or clubbing noted b/l LE.Marland Kitchen  Dermatological Examination: Pedal skin with normal turgor, texture and tone b/l. No open wounds. No interdigital macerations b/l.   Toenails 1-5  left, 2-5 right thickened, discolored, dystrophic with subungual debris. There is pain on palpation to dorsal aspect of nailplates.   Anonychia noted R hallux. Nailbed(s) epithelialized.   Neurological Examination: Protective sensation intact with 10 gram monofilament b/l LE. Vibratory sensation intact b/l LE. Pt has subjective symptoms of neuropathy.  Musculoskeletal Examination: Muscle strength 5/5 to all LE muscle groups b/l. No pain, crepitus or joint limitation noted with ROM bilateral LE. Pes planus b/l.  Radiographs: None  Assessment/Plan: 1. Pain due to onychomycosis of toenails of both feet    2. Pes planus of both feet   3. Type 2 diabetes mellitus with diabetic polyneuropathy, without long-term current use of insulin (HCC)     {Jgplan:[id number]::"-Patient/POA to call should there be question/concern in the interim."}   Return in about 3 months (around 07/10/2022).  [name], DPM

## 2022-04-17 ENCOUNTER — Encounter: Payer: Self-pay | Admitting: Nurse Practitioner

## 2022-04-17 ENCOUNTER — Other Ambulatory Visit: Payer: Self-pay | Admitting: Nurse Practitioner

## 2022-04-17 ENCOUNTER — Telehealth: Payer: Self-pay

## 2022-04-17 DIAGNOSIS — G3184 Mild cognitive impairment, so stated: Secondary | ICD-10-CM

## 2022-04-17 NOTE — Progress Notes (Signed)
Care Management & Coordination Services Pharmacy Team  Reason for Encounter: Appointment Reminder  Contacted patient to confirm telephone appointment with Donna Gilmore, PharmD on 04-19-2022 at 9:00. Unsuccessful outreach. Left voicemail for patient to return call. Completed and uploaded Rybelsus 14 mg application to mail.  Chart review: Recent office visits:  04-10-2022 [name], LPN. Medicare wellness visit.  Recent consult visits:  04-09-2022 [name], DPM (Podiatry). Order placed for diabetic shoes. Toenails were debrided in length and girth 2-5 bilaterally   Hospital visits:  Medication Reconciliation was completed by comparing discharge summary, patient's EMR and Pharmacy list, and upon discussion with patient.  Admitted to the hospital on 12-23-2021 due to Non-seasonal allergic rhinitis. Discharge date was 12-23-2021. Discharged from [facility] health urgent care.   New?Medications Started at [facility] Discharge:?? Afrin nasal spray 1 spray into both nostrils twice daily for 3 days  Medication Changes at Hospital Discharge: None  Medications Discontinued at Hospital Discharge: None  Medications that remain the same after Hospital Discharge:??  -All other medications will remain the same.     Star Rating Drugs: Rybelsus 7 mg- Last filled 02-13-2022 90 DS [facility]. Previous 06-12-2021 90 DS [facility] Atorvastatin 80 mg- Last filled 04-08-2022 100 [facility]. Previous 01-03-2022 100 DS. Olmesartan/HCTZ 40-12.5 mg- Last filled 04-08-2022 100 [facility]. Previous 01-03-2022 100 DS.  Care Gaps: Annual wellness visit in last year? Yes Covid booster overdue Eye exam overdue Mammogram overdue  If Diabetic: Last eye exam / retinopathy screening: None Last diabetic foot exam: 04-09-2022   [place] Clinical Pharmacist Assistant [phone]

## 2022-04-18 ENCOUNTER — Other Ambulatory Visit: Payer: Self-pay | Admitting: Nurse Practitioner

## 2022-04-18 DIAGNOSIS — R202 Paresthesia of skin: Secondary | ICD-10-CM

## 2022-04-19 ENCOUNTER — Telehealth: Payer: Self-pay

## 2022-04-19 NOTE — Telephone Encounter (Signed)
  Care Management   Follow Up Note   04/19/2022 Name: Donna Gilmore MRN: [id number] DOB: 1940/07/20   Referred by: [name], FNP Reason for referral : No chief complaint on file.   An unsuccessful telephone outreach was attempted today. The patient was referred to the case management team for assistance with care management and care coordination.   Follow Up Plan: Telephone follow up appointment with care management team member scheduled for:  04/19/2022 Orlando Penner, CPP, PharmD Clinical Pharmacist Practitioner [facility] [phone]

## 2022-04-19 NOTE — Progress Notes (Unsigned)
Care Management & Coordination Services Pharmacy Note  04/19/2022 Name:  Donna Gilmore MRN:  [id number] DOB:  09-02-1940  Summary: ***  Recommendations/Changes made from today's visit: ***  Follow up plan: ***   Subjective: Donna Gilmore is an 82 y.o. year old female who is a primary patient of [name], [place].  The care coordination team was consulted for assistance with disease management and care coordination needs.    Engaged with patient by telephone for follow up visit.  Recent office visits:  04-10-2022 [name], LPN. Medicare wellness visit.   Recent consult visits:  04-09-2022 [name], DPM (Podiatry). Order placed for diabetic shoes. Toenails were debrided in length and girth 2-5 bilaterally    Hospital visits:  Medication Reconciliation was completed by comparing discharge summary, patient's EMR and Pharmacy list, and upon discussion with patient.   Admitted to the hospital on 12-23-2021 due to Non-seasonal allergic rhinitis. Discharge date was 12-23-2021. Discharged from [facility] health urgent care.    New?Medications Started at [facility] Discharge:?? Afrin nasal spray 1 spray into both nostrils twice daily for 3 days   Medication Changes at Hospital Discharge: None   Medications Discontinued at Hospital Discharge: None   Medications that remain the same after Hospital Discharge:??  -All other medications will remain the same.      Hospital visits: None in previous 6 months   Objective:  Lab Results  Component Value Date   CREATININE 1.48 (H) 03/11/2022   BUN 14 03/11/2022   EGFR 35 (L) 03/11/2022   GFRNONAA 48 (L) 10/19/2019   GFRAA 56 (L) 10/19/2019   NA 139 03/11/2022   K 4.6 03/11/2022   CALCIUM 10.0 03/11/2022   CO2 20 03/11/2022   GLUCOSE 96 03/11/2022    Lab Results  Component Value Date/Time   HGBA1C 7.5 (H) 02/11/2022 12:03 PM   HGBA1C 7.3 (H) 10/04/2021 11:59 AM   MICROALBUR 30 07/26/2020 04:28 PM    MICROALBUR 10 06/30/2019 11:47 AM    Last diabetic Eye exam:  Lab Results  Component Value Date/Time   HMDIABEYEEXA No Retinopathy 10/19/2020 12:00 AM    Last diabetic Foot exam: No results found for: "HMDIABFOOTEX"   Lab Results  Component Value Date   CHOL 173 02/11/2022   HDL 56 02/11/2022   LDLCALC 78 02/11/2022   TRIG 239 (H) 02/11/2022   CHOLHDL 3.1 02/11/2022       Latest Ref Rng & Units 10/04/2021   11:59 AM 09/25/2020   11:33 AM 04/18/2020    4:38 PM  Hepatic Function  Total Protein 6.0 - 8.5 g/dL 6.8  7.2  7.4   Albumin 3.7 - 4.7 g/dL 4.3  4.3  4.4   AST 0 - 40 IU/L 21  18  26    ALT 0 - 32 IU/L 13  14  18    Alk Phosphatase 44 - 121 IU/L 106  114  105   Total Bilirubin 0.0 - 1.2 mg/dL 0.3  0.2  0.2     Lab Results  Component Value Date/Time   TSH 3.220 03/11/2022 12:19 PM   TSH 3.910 10/04/2021 11:59 AM   FREET4 0.95 08/13/2013 08:15 AM       Latest Ref Rng & Units 03/11/2022   12:19 PM 10/04/2021   11:59 AM 09/25/2020   11:33 AM  CBC  WBC 3.4 - 10.8 x10E3/uL 8.5  6.6  7.4   Hemoglobin 11.1 - 15.9 g/dL 11.7  11.5  11.1   Hematocrit 34.0 -  46.6 % 35.9  36.4  34.1   Platelets 150 - 450 x10E3/uL 262  255  261     Lab Results  Component Value Date/Time   VD25OH 47.5 10/04/2021 11:59 AM   VITAMINB12 995 10/04/2021 11:59 AM   VITAMINB12 983 06/27/2020 04:33 PM    Clinical ASCVD: {YES/NO:21197} The ASCVD Risk score (Arnett DK, et al., 2019) failed to calculate for the following reasons:   The 2019 ASCVD risk score is only valid for ages 3 to 6   The patient has a prior MI or stroke diagnosis    ***Other: (CHADS2VASc if Afib, MMRC or CAT for COPD, ACT, DEXA)     04/10/2022   12:02 PM 03/11/2022   11:02 AM 02/11/2022   11:33 AM  Depression screen PHQ 2/9  Decreased Interest 0 3 3  Down, Depressed, Hopeless 0 3 3  PHQ - 2 Score 0 6 6  Altered sleeping  3 3  Tired, decreased energy  3 3  Change in appetite  3 3  Feeling bad or failure about yourself    3 0  Trouble concentrating  0 3  Moving slowly or fidgety/restless  3 0  Suicidal thoughts  0 0  PHQ-9 Score  21 18  Difficult doing work/chores  Very difficult      Social History   Tobacco Use  Smoking Status Former   Packs/day: 0.30   Years: 30.00   Additional pack years: 0.00   Total pack years: 9.00   Types: Cigarettes   Quit date: 2019   Years since quitting: 5.2  Smokeless Tobacco Never   BP Readings from Last 3 Encounters:  03/11/22 128/70  02/11/22 130/72  02/06/22 130/78   Pulse Readings from Last 3 Encounters:  03/11/22 78  02/11/22 70  02/06/22 60   Wt Readings from Last 3 Encounters:  04/10/22 188 lb (85.3 kg)  03/11/22 184 lb 12.8 oz (83.8 kg)  02/11/22 186 lb (84.4 kg)   BMI Readings from Last 3 Encounters:  04/10/22 33.30 kg/m  03/11/22 30.75 kg/m  02/11/22 30.95 kg/m    No Known Allergies  Medications Reviewed Today     Reviewed by [name], LPN (Licensed Practical Nurse) on 04/10/22 at 74  Med List Status: <None>   Medication Order Taking? Sig Documenting Provider Last Dose Status Informant  0.9 %  sodium chloride infusion [id number]   [name], MD  Active   acetaminophen (TYLENOL) 500 MG tablet [id number] Yes Take 1 tablet (500 mg total) by mouth every 6 (six) hours as needed. Chase Picket, MD Taking Active   albuterol (VENTOLIN HFA) 108 (90 Base) MCG/ACT inhaler [id number] Yes Inhale 2 puffs into the lungs every 6 (six) hours as needed for wheezing or shortness of breath. Rigoberto Noel, MD Taking Active   atorvastatin (LIPITOR) 80 MG tablet [id number] Yes TAKE 1 TABLET BY MOUTH ONCE DAILY [name], FNP Taking Active   azelastine (OPTIVAR) 0.05 % ophthalmic solution [id number] No Place 1 drop into both eyes 2 (two) times daily.  Patient not taking: Reported on 03/26/2022   [name], MD Not Taking Active   Azelastine-Fluticasone [facility]) 137-50 MCG/ACT SUSP [id number] Yes Place 1 spray into the nose in  the morning and at bedtime. [name], MD Taking Active   Budeson-Glycopyrrol-Formoterol [facility] AEROSPHERE) 160-9-4.8 MCG/ACT Hollie Salk [id number] Yes USE 2 INHALATIONS BY MOUTH  DAILY [name], FNP Taking Active   cholecalciferol (VITAMIN D3) 25 MCG (1000 UNIT) tablet  [id number] Yes Take 1,000 Units by mouth daily. [provider] Taking Active   citalopram (CELEXA) 20 MG tablet [id number] Yes Take 0.5 tablets (10 mg total) by mouth daily. [name], FNP Taking Active   clopidogrel (PLAVIX) 75 MG tablet [id number] Yes TAKE 1 TABLET BY MOUTH DAILY [name], FNP Taking Active   cyclobenzaprine (FLEXERIL) 5 MG tablet [id number] Yes  [provider] Taking Active   diazepam (VALIUM) 5 MG tablet [id number] Yes Take 1 tablet (5 mg total) by mouth every 8 (eight) hours as needed for anxiety. [name], FNP Taking Active   diclofenac Sodium (VOLTAREN) 1 % GEL [id number] Yes Apply topically 4 (four) times daily. [provider] Taking Active   donepezil (ARICEPT) 10 MG tablet [id number] Yes Take 1 tablet (10 mg total) by mouth at bedtime. [name], FNP Taking Active   ezetimibe (ZETIA) 10 MG tablet [id number] Yes Take 1 tablet (10 mg total) by mouth daily. [name], MD Taking Active   fluorometholone (FML) 0.1 % ophthalmic suspension [id number] No 1 drop 4 (four) times daily.  Patient not taking: Reported on 03/26/2022   [provider] Not Taking Active   gabapentin (NEURONTIN) 100 MG capsule [id number] Yes TAKE 1 CAPSULE BY MOUTH 3  TIMES DAILY AS NEEDED [name], FNP Taking Active   glucose blood (ONETOUCH ULTRA) test strip [id number] Yes USE WITH METER TO CHECK BLOOD SUGAR BEFORE BREAKFAST AND BEFORE Andrey Spearman, Doreene Burke, FNP Taking Active   hydrochlorothiazide (HYDRODIURIL) 12.5 MG tablet [id number] Yes  [provider] Taking Active   levocetirizine (XYZAL) 5 MG tablet [id number] Yes Take 1 tablet (5 mg total) by mouth every  evening. [name], FNP Taking Active   meclizine (ANTIVERT) 25 MG tablet [id number] Yes TAKE 1 TABLET BY MOUTH 2  TIMES EVERY DAY AS NEEDED [name], FNP Taking Active   Melatonin 5 MG CAPS [id number] Yes Take 1 capsule (5 mg total) by mouth daily after supper. Dohmeier, Asencion Partridge, MD Taking Active   olmesartan-hydrochlorothiazide [facility] HCT) 40-12.5 MG tablet [id number] Yes TAKE 1 TABLET BY MOUTH DAILY [name], FNP Taking Active   pimecrolimus (ELIDEL) 1 % cream [id number] Yes Apply thin layer to the skin as needed twice a day for itching or rash [name], MD Taking Active   Probiotic Product (PROBIOTIC-10 PO) [id number] Yes Take by mouth. Take one tablet daily [provider] Taking Active   Semaglutide (RYBELSUS) 7 MG TABS [id number] Yes Take 1 tablet by mouth daily. [name], FNP Taking Active   vitamin C (ASCORBIC ACID) 500 MG tablet [id number] Yes Take 500 mg by mouth daily. [provider] Taking Active Self  VITAMIN D PO [id number] Yes Take 1 tablet by mouth daily. [provider] Taking Active             SDOH:  (Social Determinants of Health) assessments and interventions performed: {yes/no:20286} SDOH Interventions    Flowsheet Row Clinical Support from 04/10/2022 in [facility] Office Visit from 02/11/2022 in [facility] Office Visit from 10/04/2021 in [facility] Office Visit from 09/04/2021 in [facility] Office Visit from 05/29/2021 in [facility] Chronic Care Management from 01/24/2021 in [facility]  SDOH Interventions        Food Insecurity Interventions Intervention Not Indicated -- -- -- -- Intervention Not Indicated  Housing Interventions Intervention Not Indicated -- -- -- --  Intervention Not Indicated  Transportation  Interventions Intervention Not Indicated -- -- -- -- Intervention Not Indicated  Depression Interventions/Treatment  -- Medication PHQ2-9 Score <4 Follow-up Not Indicated PHQ2-9 Score <4 Follow-up Not Indicated PHQ2-9 Score <4 Follow-up Not Indicated --  Financial Strain Interventions Intervention Not Indicated -- -- -- -- --  Physical Activity Interventions Patient Refused, Other (Comments) -- -- -- -- --  Stress Interventions Intervention Not Indicated -- -- -- -- --       Medication Assistance: {MEDASSISTANCEINFO:25044}  Medication Access: Within the past 30 days, how often has patient missed a dose of medication? *** Is a pillbox or other method used to improve adherence? {YES/NO:21197} Factors that may affect medication adherence? {CHL DESC; BARRIERS:21522} Are meds synced by current pharmacy? {YES/NO:21197} Are meds delivered by current pharmacy? {YES/NO:21197} Does patient experience delays in picking up medications due to transportation concerns? {YES/NO:21197}  [facility] Reviewed: Is patient disadvantaged to use [facility]?: {YES/NO:21197} Current Rx insurance plan: *** Name and location of Current pharmacy:  [place], [place] [address] [address] Phone: [phone] Fax: [phone]  [facility] ([place], [place] [facility] [address] [place] [address] Phone: [phone] Fax: [phone]  [place], [address] [place] [address] Phone: [phone] Fax: [place], [place]. [place] [address] Phone: [phone] Fax: [phone]  [facility] services reviewed with patient today?: {YES/NO:21197} Patient requests to transfer care to [facility]?:  {YES/NO:21197} Reason patient declined to change pharmacies: {US patient preference:27474}  Compliance/Adherence/Medication fill history: Care Gaps: ***  Star-Rating Drugs: ***   Assessment/Plan   {CCM PHARMD DISEASE STATES:25130}  ***

## 2022-04-22 ENCOUNTER — Ambulatory Visit
Admission: RE | Admit: 2022-04-22 | Discharge: 2022-04-22 | Disposition: A | Discharge status: Home / Self Care | Payer: Medicare Other | Source: Ambulatory Visit | Attending: Nurse Practitioner | Admitting: Nurse Practitioner

## 2022-04-22 ENCOUNTER — Other Ambulatory Visit: Payer: Self-pay | Admitting: Nurse Practitioner

## 2022-04-22 ENCOUNTER — Ambulatory Visit: Payer: Medicare Other

## 2022-04-22 DIAGNOSIS — J449 Chronic obstructive pulmonary disease, unspecified: Secondary | ICD-10-CM

## 2022-04-22 DIAGNOSIS — E785 Hyperlipidemia, unspecified: Secondary | ICD-10-CM

## 2022-04-22 DIAGNOSIS — I129 Hypertensive chronic kidney disease with stage 1 through stage 4 chronic kidney disease, or unspecified chronic kidney disease: Secondary | ICD-10-CM

## 2022-04-22 DIAGNOSIS — G3184 Mild cognitive impairment, so stated: Secondary | ICD-10-CM

## 2022-04-22 DIAGNOSIS — R42 Dizziness and giddiness: Secondary | ICD-10-CM

## 2022-04-22 DIAGNOSIS — N1831 Chronic kidney disease, stage 3a: Secondary | ICD-10-CM

## 2022-04-22 DIAGNOSIS — R531 Weakness: Secondary | ICD-10-CM

## 2022-04-22 DIAGNOSIS — Z1231 Encounter for screening mammogram for malignant neoplasm of breast: Secondary | ICD-10-CM | POA: Diagnosis not present

## 2022-04-22 DIAGNOSIS — R140 Abdominal distension (gaseous): Secondary | ICD-10-CM

## 2022-04-22 DIAGNOSIS — E1151 Type 2 diabetes mellitus with diabetic peripheral angiopathy without gangrene: Secondary | ICD-10-CM

## 2022-04-24 ENCOUNTER — Other Ambulatory Visit: Payer: Medicare Other

## 2022-04-24 ENCOUNTER — Ambulatory Visit: Payer: Self-pay

## 2022-04-24 NOTE — Patient Outreach (Signed)
  Care Coordination   04/24/2022 Name: Donna Gilmore MRN: [id number] DOB: 04/17/1940   Care Coordination Outreach Attempts:  An unsuccessful telephone outreach was attempted for a scheduled appointment today.  Follow Up Plan:  Additional outreach attempts will be made to offer the patient care coordination information and services.   Encounter Outcome:  No Answer   Care Coordination Interventions:  No, not indicated    Barb Merino, RN, BSN, CCM Care Management Coordinator [facility] Care Management  Direct Phone: [phone]

## 2022-04-29 ENCOUNTER — Encounter (HOSPITAL_COMMUNITY): Payer: Self-pay | Admitting: Emergency Medicine

## 2022-04-29 ENCOUNTER — Ambulatory Visit (HOSPITAL_COMMUNITY)
Admission: EM | Admit: 2022-04-29 | Discharge: 2022-04-29 | Disposition: A | Discharge status: Home / Self Care | Payer: Medicare Other

## 2022-04-29 ENCOUNTER — Emergency Department (HOSPITAL_COMMUNITY)
Admission: EM | Admit: 2022-04-29 | Discharge: 2022-04-30 | Disposition: A | Discharge status: Home / Self Care | Payer: Medicare Other | Attending: Emergency Medicine | Admitting: Emergency Medicine

## 2022-04-29 ENCOUNTER — Other Ambulatory Visit: Payer: Self-pay

## 2022-04-29 DIAGNOSIS — R600 Localized edema: Secondary | ICD-10-CM | POA: Insufficient documentation

## 2022-04-29 DIAGNOSIS — R197 Diarrhea, unspecified: Secondary | ICD-10-CM

## 2022-04-29 DIAGNOSIS — R7402 Elevation of levels of lactic acid dehydrogenase (LDH): Secondary | ICD-10-CM | POA: Insufficient documentation

## 2022-04-29 DIAGNOSIS — I10 Essential (primary) hypertension: Secondary | ICD-10-CM | POA: Diagnosis not present

## 2022-04-29 DIAGNOSIS — R198 Other specified symptoms and signs involving the digestive system and abdomen: Secondary | ICD-10-CM

## 2022-04-29 DIAGNOSIS — Z7902 Long term (current) use of antithrombotics/antiplatelets: Secondary | ICD-10-CM | POA: Insufficient documentation

## 2022-04-29 DIAGNOSIS — R1084 Generalized abdominal pain: Secondary | ICD-10-CM | POA: Insufficient documentation

## 2022-04-29 DIAGNOSIS — I701 Atherosclerosis of renal artery: Secondary | ICD-10-CM | POA: Diagnosis not present

## 2022-04-29 LAB — URINALYSIS, ROUTINE W REFLEX MICROSCOPIC
Bilirubin Urine: NEGATIVE
Glucose, UA: NEGATIVE mg/dL
Hgb urine dipstick: NEGATIVE
Ketones, ur: NEGATIVE mg/dL
Nitrite: NEGATIVE
Protein, ur: NEGATIVE mg/dL
Specific Gravity, Urine: 1.009 (ref 1.005–1.030)
pH: 5 (ref 5.0–8.0)

## 2022-04-29 LAB — COMPREHENSIVE METABOLIC PANEL
ALT: 16 U/L (ref 0–44)
AST: 28 U/L (ref 15–41)
Albumin: 3.9 g/dL (ref 3.5–5.0)
Alkaline Phosphatase: 87 U/L (ref 38–126)
Anion gap: 11 (ref 5–15)
BUN: 10 mg/dL (ref 8–23)
CO2: 24 mmol/L (ref 22–32)
Calcium: 9.5 mg/dL (ref 8.9–10.3)
Chloride: 103 mmol/L (ref 98–111)
Creatinine, Ser: 1.49 mg/dL — ABNORMAL HIGH (ref 0.44–1.00)
GFR, Estimated: 35 mL/min — ABNORMAL LOW (ref >60)
Glucose, Bld: 91 mg/dL (ref 70–99)
Potassium: 3.3 mmol/L — ABNORMAL LOW (ref 3.5–5.1)
Sodium: 138 mmol/L (ref 135–145)
Total Bilirubin: 0.4 mg/dL (ref 0.3–1.2)
Total Protein: 7.1 g/dL (ref 6.5–8.1)

## 2022-04-29 LAB — CBC
HCT: 35.6 % — ABNORMAL LOW (ref 36.0–46.0)
Hemoglobin: 11.7 g/dL — ABNORMAL LOW (ref 12.0–15.0)
MCH: 29.6 pg (ref 26.0–34.0)
MCHC: 32.9 g/dL (ref 30.0–36.0)
MCV: 90.1 fL (ref 80.0–100.0)
Platelets: 237 10*3/uL (ref 150–400)
RBC: 3.95 MIL/uL (ref 3.87–5.11)
RDW: 14.8 % (ref 11.5–15.5)
WBC: 7.7 10*3/uL (ref 4.0–10.5)
nRBC: 0 % (ref 0.0–0.2)

## 2022-04-29 LAB — LIPASE, BLOOD: Lipase: 36 U/L (ref 11–51)

## 2022-04-29 NOTE — ED Notes (Addendum)
Called [facility] to align transport for patient to ED. Was told by dispatch that has no truck available and needs to call [facility].  Leotis Pain PA aware.

## 2022-04-29 NOTE — ED Triage Notes (Signed)
For 2 weeks, has had diarrhea.  Patient reports if she eats or drinks anything, it goes right through her.  Patient has minimal abdominal pain.  . No vomiting.    Patient is not able to report number of episodes of diarrhea. Patient called pcp-and told to get evaluated.

## 2022-04-29 NOTE — ED Notes (Signed)
Patient is being discharged from the Urgent Care and sent to the Emergency Department via POV . Per [employer], PA, patient is in need of higher level of care due to limited resorces. Patient is aware and verbalizes understanding of plan of care.  Vitals:   04/29/22 1741  BP: 118/68  Pulse: 76  Resp: (!) 22  Temp: 98.3 F (36.8 C)  SpO2: 96%

## 2022-04-29 NOTE — ED Provider Notes (Signed)
[place]    CSN: [id number] Arrival date & time: 04/29/22  1619     History   Chief Complaint Chief Complaint  Patient presents with   Diarrhea   Shortness of Breath    HPI Donna Gilmore is a 82 y.o. female.  Patient is HOH, difficult history  About 2 week history of loose stool Cannot state how many episodes daily. No blood or mucous. Feels whatever she eats or drinks "goes right through her" No vomiting or fevers.  She had called PCP on 3/28 and was advised to seek in person eval at urgent care  Denies abdominal pain although very tender on exam. See physical exam.   Past Medical History:  Diagnosis Date   Benign paroxysmal positional vertigo 08/26/2012   CVA (cerebral infarction) 08/25/2012   Diabetes mellitus without complication    Dyslipidemia 08/25/2012   Essential hypertension, benign 08/25/2012   Hyperlipidemia    Hypertension    Meralgia paraesthetica, left 01/12/2018   Stroke     Patient Active Problem List   Diagnosis Date Noted   Anxiety 03/14/2022   Moderate episode of recurrent major depressive disorder 03/14/2022   Generalized weakness 03/14/2022   Nocturia more than twice per night 11/20/2021   Night-waking disorder, delayed sleep phase type 11/20/2021   REM sleep behavior disorder 11/20/2021   Chronic obstructive pulmonary disease 11/20/2021   Moderate obstructive sleep apnea-hypopnea syndrome 11/20/2021   Chronic rhinitis 09/04/2020   COPD with asthma 02/25/2019   Peripheral vascular disease 02/25/2019   Pain in left foot 08/21/2018   Chronic bilateral low back pain without sciatica 06/01/2018   Class 1 obesity due to excess calories without serious comorbidity with body mass index (BMI) of 32.0 to 32.9 in adult 06/01/2018   Acute left-sided low back pain with left-sided sciatica 05/04/2018   Shortness of breath 05/04/2018   Tinnitus, bilateral 03/18/2018   Meralgia paraesthetica, left 01/12/2018   Parotid mass 01/10/2017    Sensorineural hearing loss (SNHL) of both ears 01/10/2017   Mild cognitive impairment 10/14/2016   History of stroke 10/14/2016   History of syphilis 10/14/2016   Mixed hyperlipidemia 10/14/2016   Insomnia 10/14/2016   Cerebellar stroke syndrome 10/23/2013   Smoker 02/16/2013   Chest pain 09/21/2012   Tobacco abuse disorder 09/21/2012   Benign paroxysmal positional vertigo 08/26/2012   CVA (cerebral infarction) 08/25/2012   Essential hypertension 08/25/2012   Dyslipidemia 08/25/2012    History reviewed. No pertinent surgical history.  OB History   No obstetric history on file.      Home Medications    Prior to Admission medications   Medication Sig Start Date End Date Taking? Authorizing Provider  acetaminophen (TYLENOL) 500 MG tablet Take 1 tablet (500 mg total) by mouth every 6 (six) hours as needed. 08/13/18   Lamptey, Myrene Galas, MD  albuterol (VENTOLIN HFA) 108 (90 Base) MCG/ACT inhaler Inhale 2 puffs into the lungs every 6 (six) hours as needed for wheezing or shortness of breath. Patient not taking: Reported on 04/29/2022 07/04/20   Rigoberto Noel, MD  atorvastatin (LIPITOR) 80 MG tablet TAKE 1 TABLET BY MOUTH ONCE DAILY 09/03/21   [name], FNP  azelastine (OPTIVAR) 0.05 % ophthalmic solution Place 1 drop into both eyes 2 (two) times daily. Patient not taking: Reported on 03/26/2022 02/06/22   [name], MD  Azelastine-Fluticasone [facility]) 137-50 MCG/ACT SUSP Place 1 spray into the nose in the morning and at bedtime. 04/18/21   Prudy Feeler  Mardene Celeste, MD  Budeson-Glycopyrrol-Formoterol (BREZTRI AEROSPHERE) 160-9-4.8 MCG/ACT AERO USE 2 INHALATIONS BY MOUTH  DAILY 03/11/22   [name], FNP  cholecalciferol (VITAMIN D3) 25 MCG (1000 UNIT) tablet Take 1,000 Units by mouth daily.    [provider]  citalopram (CELEXA) 20 MG tablet Take 0.5 tablets (10 mg total) by mouth daily. 03/11/22 03/11/23  [name], FNP  clopidogrel (PLAVIX) 75 MG tablet  TAKE 1 TABLET BY MOUTH DAILY 11/23/21   [name], FNP  cyclobenzaprine (FLEXERIL) 5 MG tablet     [provider]  diazepam (VALIUM) 5 MG tablet Take 1 tablet (5 mg total) by mouth every 8 (eight) hours as needed for anxiety. 03/11/22   [name], FNP  diclofenac Sodium (VOLTAREN) 1 % GEL Apply topically 4 (four) times daily.    [provider]  donepezil (ARICEPT) 10 MG tablet TAKE 1 TABLET BY MOUTH AT  BEDTIME 04/18/22   [name], FNP  ezetimibe (ZETIA) 10 MG tablet Take 1 tablet (10 mg total) by mouth daily. 09/07/21 09/02/22  [name], MD  fluorometholone (FML) 0.1 % ophthalmic suspension 1 drop 4 (four) times daily. Patient not taking: Reported on 03/26/2022 08/02/21   [provider]  gabapentin (NEURONTIN) 100 MG capsule TAKE 1 CAPSULE BY MOUTH 3 TIMES  DAILY AS NEEDED 04/19/22   [name], FNP  glucose blood (ONETOUCH ULTRA) test strip USE WITH METER TO CHECK BLOOD SUGAR BEFORE BREAKFAST AND BEFORE DINNER 10/22/21   [name], FNP  hydrochlorothiazide (HYDRODIURIL) 12.5 MG tablet     [provider]  levocetirizine (XYZAL) 5 MG tablet Take 1 tablet (5 mg total) by mouth every evening. 12/28/19   [name], FNP  meclizine (ANTIVERT) 25 MG tablet TAKE 1 TABLET BY MOUTH 2  TIMES EVERY DAY AS NEEDED 02/11/22   [name], FNP  Melatonin 5 MG CAPS Take 1 capsule (5 mg total) by mouth daily after supper. 11/20/21   Dohmeier, Asencion Partridge, MD  olmesartan-hydrochlorothiazide (BENICAR HCT) 40-12.5 MG tablet TAKE 1 TABLET BY MOUTH DAILY 09/12/21   [name], FNP  pimecrolimus (ELIDEL) 1 % cream Apply thin layer to the skin as needed twice a day for itching or rash 09/20/21   [name], MD  Probiotic Product (PROBIOTIC-10 PO) Take by mouth. Take one tablet daily    [provider]  Semaglutide (RYBELSUS) 7 MG TABS Take 1 tablet by mouth daily. 02/12/22   [name], FNP  vitamin C (ASCORBIC ACID) 500 MG tablet Take  500 mg by mouth daily.    [provider]  VITAMIN D PO Take 1 tablet by mouth daily.    [provider]    Family History Family History  Problem Relation Age of Onset   Kidney failure Mother    Diabetes Mother    Alzheimer's disease Mother    Alzheimer's disease Father    Prostate cancer Father    Diabetes Maternal Grandmother    Diabetes Maternal Grandfather    Alzheimer's disease Paternal Grandmother    Alzheimer's disease Paternal Grandfather    Prostate cancer Paternal Uncle     Social History Social History   Tobacco Use   Smoking status: Former    Packs/day: 0.30    Years: 30.00    Additional pack years: 0.00    Total pack years: 9.00    Types: Cigarettes    Quit date: 2019    Years since quitting: 5.2   Smokeless tobacco: Never  Vaping Use  Vaping Use: Never used  Substance Use Topics   Alcohol use: Not Currently    Comment: occasional   Drug use: No     Allergies   Patient has no known allergies.   Review of Systems Review of Systems As per HPI  Physical Exam Triage Vital Signs ED Triage Vitals [04/29/22 1738]  Enc Vitals Group     BP      Pulse      Resp      Temp      Temp src      SpO2      Weight      Height      Head Circumference      Peak Flow      Pain Score 10     Pain Loc      Pain Edu?      Excl. in [place]?    No data found.  Updated Vital Signs BP 118/68 (BP Location: Left Arm)   Pulse 76   Temp 98.3 F (36.8 C) (Oral)   Resp (!) 22   SpO2 96%    Physical Exam Vitals and nursing note reviewed.  Constitutional:      General: She is not in acute distress.    Appearance: She is not ill-appearing.  HENT:     Mouth/Throat:     Pharynx: Oropharynx is clear.  Cardiovascular:     Rate and Rhythm: Normal rate and regular rhythm.     Heart sounds: Normal heart sounds.  Pulmonary:     Effort: Pulmonary effort is normal.     Breath sounds: Normal breath sounds.     Comments: Normal effort. Speaks in  full sentences  Abdominal:     General: Bowel sounds are normal. There is distension.     Palpations: Abdomen is soft.     Tenderness: There is generalized abdominal tenderness. There is guarding.     Comments: Reports her belly does not usually look this "big". She is tender throughout exam with guarding epigastric and periumbilical. Not rigid.   Neurological:     Mental Status: She is alert. Mental status is at baseline.     Comments: Baseline per son who is bedside      UC Treatments / Results  Labs (all labs ordered are listed, but only abnormal results are displayed) Labs Reviewed - No data to display  EKG   Radiology No results found.  Procedures Procedures (including critical care time)  Medications Ordered in UC Medications - No data to display  Initial Impression / Assessment and Plan / UC Course  I have reviewed the triage vital signs and the nursing notes.  Pertinent labs & imaging results that were available during my care of the patient were reviewed by me and considered in my medical decision making (see chart for details).  With abd guarding, 2 weeks of diarrhea, patient age, needs to be evaluated in the ED. Likely needs CT scan of the belly. Son will transport her to ED via POV  Final Clinical Impressions(s) / UC Diagnoses   Final diagnoses:  Abdominal guarding  Diarrhea, unspecified type     Discharge Instructions      Sent to ED via [place]     ED Prescriptions   None    PDMP not reviewed this encounter.   [place], [place] 04/29/22 [id number]

## 2022-04-29 NOTE — ED Provider Triage Note (Signed)
Emergency Medicine Provider Triage Evaluation Note  Donna Gilmore , a 82 y.o. female  was evaluated in triage.  Pt complains of abdominal pain, 2 weeks of diarrhea. Sent over from [facility] for further evaluation and CT. Denies hematochezia. Denies nausea, vomiting, fever, chills. Denies recent abx.  Review of Systems  Positive: Abdominal pain, diarrhea Negative: Fever, chills  Physical Exam  BP (!) 141/87 (BP Location: Right Arm)   Pulse 69   Temp 97.9 F (36.6 C) (Oral)   Resp 18   SpO2 100%  Gen:   Awake, no distress   Resp:  Normal effort  MSK:   Moves extremities without difficulty  Other:  Ttp generally in abdomen, most focally in epigastric region  Medical Decision Making  Medically screening exam initiated at 6:40 PM.  Appropriate orders placed.  Jenine Margrave Steedman was informed that the remainder of the evaluation will be completed by another provider, this initial triage assessment does not replace that evaluation, and the importance of remaining in the ED until their evaluation is complete.  Workup initiated in triage    Anselmo Pickler, [place] 04/29/22 1841

## 2022-04-29 NOTE — Telephone Encounter (Signed)
Spoke with patient and she reports diarrhea for over one week, soreness to her rectal area, denies nausea/emesis/fever/chills, does report some abd pain with bloating. She has been eating smaller meals, twice a day. She states she spoke with someone this morning, does not recall who/where, that advised her to take Immodium. Explained to patient that this is not to be taken for more than a few days at a time. Also recommended she could follow the BRAT diet and add soluble fiber to her intake, as this would help add bulk to her stools. Patient voiced understanding.   Spoke with Doreene Burke regarding patient and her symptoms. She advises patient seek evaluation at [facility] for her symptoms.   Spoke with patient/son and advised. Recommended she go when she is able, either today or tomorrow, as this is not emergent in nature at this time. Explained to patient UC vs ED evaluation, she voiced understanding. She will have her son take her to the UC when able. Nothing further needed at this time.

## 2022-04-29 NOTE — Discharge Instructions (Signed)
Sent to ED via POV ?

## 2022-04-29 NOTE — ED Triage Notes (Signed)
Pt states she is having abdominal pain for the past two weeks, Denies any n/v but is having diarrhea. Was seen at [facility] today and sent over here.

## 2022-04-30 ENCOUNTER — Emergency Department (HOSPITAL_COMMUNITY): Payer: Medicare Other

## 2022-04-30 DIAGNOSIS — I701 Atherosclerosis of renal artery: Secondary | ICD-10-CM | POA: Diagnosis not present

## 2022-04-30 LAB — LACTIC ACID, PLASMA
Lactic Acid, Venous: 2.8 mmol/L (ref 0.5–1.9)
Lactic Acid, Venous: 1.9 mmol/L (ref 0.5–1.9)

## 2022-04-30 MED ORDER — FENTANYL CITRATE PF 50 MCG/ML IJ SOSY
50.0000 ug | PREFILLED_SYRINGE | Freq: Once | INTRAMUSCULAR | Status: AC
  Administered 2022-04-30: 50 ug via INTRAVENOUS
  Filled 2022-04-30: qty 1

## 2022-04-30 MED ORDER — ONDANSETRON HCL 4 MG PO TABS: 4.0000 mg | ORAL_TABLET | Freq: Three times a day (TID) | ORAL | 0 refills | Status: DC | PRN

## 2022-04-30 MED ORDER — LACTATED RINGERS IV BOLUS
500.0000 mL | Freq: Once | INTRAVENOUS | Status: AC
  Administered 2022-04-30: 500 mL via INTRAVENOUS

## 2022-04-30 MED ORDER — IOHEXOL 350 MG/ML SOLN
75.0000 mL | Freq: Once | INTRAVENOUS | Status: AC | PRN
  Administered 2022-04-30: 75 mL via INTRAVENOUS

## 2022-04-30 NOTE — ED Notes (Signed)
Gave pt saltines, applesauce and water

## 2022-04-30 NOTE — Discharge Instructions (Addendum)
You were seen in the emergency department for abdominal pain nausea vomiting.  You had blood work and a CAT scan that did not show a definite cause of your symptoms.  We have put a referral in for you to follow-up with GI.  Please reach out to them for an appointment.  Also follow-up with your primary care doctor and we have sent a prescription for nausea medicine to the pharmacy.  Return to the emergency department if any worsening or concerning symptoms

## 2022-04-30 NOTE — ED Notes (Signed)
Pt assisted to bedside commode to try for stool sample. Pt urinated but unable to have movement at this time. Assisted pt back to stretcher

## 2022-04-30 NOTE — ED Provider Notes (Signed)
Signout from Dr. Ayesha Rumpf.  82 year old female here with abdominal pain diarrhea for 1 to 2 weeks.  Workup concerning for elevated lactate although afebrile and normal white count.  CT does also not show any acute findings.  Plan from Dr. Ayesha Rumpf is follow-up on second lactate and patient's p.o. trial. Physical Exam  BP (!) 142/89 (BP Location: Right Arm)   Pulse 74   Temp 97.7 F (36.5 C) (Oral)   Resp 18   SpO2 98%   Physical Exam  Procedures  Procedures  ED Course / MDM    Medical Decision Making Amount and/or Complexity of Data Reviewed Labs: ordered. Radiology: ordered.  Risk Prescription drug management.   Patient's repeat lactate is cleared and she is tolerating p.o.  She is asking for discharge and family member is here to pick her up.  Recommended close follow-up with PCP and GI.  Return instructions discussed       Donna Rasmussen, MD 04/30/22 1121

## 2022-04-30 NOTE — ED Notes (Signed)
Pt transported to CT ?

## 2022-04-30 NOTE — ED Notes (Signed)
IV attempt x 2 unsuccessful.

## 2022-04-30 NOTE — ED Provider Notes (Signed)
[facility] Provider Note   CSN: [id number] Arrival date & time: 04/29/22  1818     History  Chief Complaint  Patient presents with   Abdominal Pain    Donna Gilmore is a 82 y.o. female.  The history is provided by the patient, a relative and medical records.  Abdominal Pain Donna Gilmore is a 82 y.o. female who presents to the Emergency Department complaining of abdominal pain.  She presents to the emergency department for evaluation of abdominal pain and diarrhea that has been present for 1 to 2 weeks.  She reports numerous episodes of diarrhea that occur about 15 minutes after eating.  She has generalized abdominal pain just prior to these episodes.  Her stool is brown in color.  She is also having episodes of diarrhea that wake her during the night.  No hematochezia or melena.  She does get short of breath with these episodes at times.  No vomiting.  She reports decreased oral intake secondary to the diarrhea.  No recent antibiotics or sick contacts.      Home Medications Prior to Admission medications   Medication Sig Start Date End Date Taking? Authorizing Provider  acetaminophen (TYLENOL) 500 MG tablet Take 1 tablet (500 mg total) by mouth every 6 (six) hours as needed. 08/13/18   Lamptey, Myrene Galas, MD  albuterol (VENTOLIN HFA) 108 (90 Base) MCG/ACT inhaler Inhale 2 puffs into the lungs every 6 (six) hours as needed for wheezing or shortness of breath. Patient not taking: Reported on 04/29/2022 07/04/20   Rigoberto Noel, MD  atorvastatin (LIPITOR) 80 MG tablet TAKE 1 TABLET BY MOUTH ONCE DAILY 09/03/21   [name], FNP  azelastine (OPTIVAR) 0.05 % ophthalmic solution Place 1 drop into both eyes 2 (two) times daily. Patient not taking: Reported on 03/26/2022 02/06/22   [name], MD  Azelastine-Fluticasone [facility]) 137-50 MCG/ACT SUSP Place 1 spray into the nose in the morning and at bedtime. 04/18/21   [name], MD  Budeson-Glycopyrrol-Formoterol (BREZTRI AEROSPHERE) 160-9-4.8 MCG/ACT AERO USE 2 INHALATIONS BY MOUTH  DAILY 03/11/22   [name], FNP  cholecalciferol (VITAMIN D3) 25 MCG (1000 UNIT) tablet Take 1,000 Units by mouth daily.    [provider]  citalopram (CELEXA) 20 MG tablet Take 0.5 tablets (10 mg total) by mouth daily. 03/11/22 03/11/23  [name], FNP  clopidogrel (PLAVIX) 75 MG tablet TAKE 1 TABLET BY MOUTH DAILY 11/23/21   [name], FNP  cyclobenzaprine (FLEXERIL) 5 MG tablet     [provider]  diazepam (VALIUM) 5 MG tablet Take 1 tablet (5 mg total) by mouth every 8 (eight) hours as needed for anxiety. 03/11/22   [name], FNP  diclofenac Sodium (VOLTAREN) 1 % GEL Apply topically 4 (four) times daily.    [provider]  donepezil (ARICEPT) 10 MG tablet TAKE 1 TABLET BY MOUTH AT  BEDTIME 04/18/22   [name], FNP  ezetimibe (ZETIA) 10 MG tablet Take 1 tablet (10 mg total) by mouth daily. 09/07/21 09/02/22  [name], MD  fluorometholone (FML) 0.1 % ophthalmic suspension 1 drop 4 (four) times daily. Patient not taking: Reported on 03/26/2022 08/02/21   [provider]  gabapentin (NEURONTIN) 100 MG capsule TAKE 1 CAPSULE BY MOUTH 3 TIMES  DAILY AS NEEDED 04/19/22   [name], FNP  glucose blood (ONETOUCH ULTRA) test strip USE WITH METER TO CHECK BLOOD SUGAR BEFORE BREAKFAST AND BEFORE  DINNER 10/22/21   [name], FNP  hydrochlorothiazide (HYDRODIURIL) 12.5 MG tablet     [provider]  levocetirizine (XYZAL) 5 MG tablet Take 1 tablet (5 mg total) by mouth every evening. 12/28/19   [name], FNP  meclizine (ANTIVERT) 25 MG tablet TAKE 1 TABLET BY MOUTH 2  TIMES EVERY DAY AS NEEDED 02/11/22   [name], FNP  Melatonin 5 MG CAPS Take 1 capsule (5 mg total) by mouth daily after supper. 11/20/21   Dohmeier, Asencion Partridge, MD  olmesartan-hydrochlorothiazide (BENICAR HCT) 40-12.5 MG tablet TAKE 1 TABLET BY  MOUTH DAILY 09/12/21   [name], FNP  pimecrolimus (ELIDEL) 1 % cream Apply thin layer to the skin as needed twice a day for itching or rash 09/20/21   [name], MD  Probiotic Product (PROBIOTIC-10 PO) Take by mouth. Take one tablet daily    [provider]  Semaglutide (RYBELSUS) 7 MG TABS Take 1 tablet by mouth daily. 02/12/22   [name], FNP  vitamin C (ASCORBIC ACID) 500 MG tablet Take 500 mg by mouth daily.    [provider]  VITAMIN D PO Take 1 tablet by mouth daily.    [provider]      Allergies    Patient has no known allergies.    Review of Systems   Review of Systems  Gastrointestinal:  Positive for abdominal pain.  All other systems reviewed and are negative.   Physical Exam Updated Vital Signs BP (!) 142/89 (BP Location: Right Arm)   Pulse 74   Temp 97.7 F (36.5 C) (Oral)   Resp 18   SpO2 98%  Physical Exam Vitals and nursing note reviewed.  Constitutional:      Appearance: She is well-developed.  HENT:     Head: Normocephalic and atraumatic.  Cardiovascular:     Rate and Rhythm: Normal rate and regular rhythm.     Heart sounds: No murmur heard. Pulmonary:     Effort: Pulmonary effort is normal. No respiratory distress.     Breath sounds: Normal breath sounds.  Abdominal:     Palpations: Abdomen is soft.     Tenderness: There is no guarding or rebound.     Comments: Moderate generalized abdominal tenderness  Musculoskeletal:        General: No tenderness.     Comments: 1+ edema to BLE  Skin:    General: Skin is warm and dry.  Neurological:     Mental Status: She is alert and oriented to person, place, and time.  Psychiatric:        Behavior: Behavior normal.     ED Results / Procedures / Treatments   Labs (all labs ordered are listed, but only abnormal results are displayed) Labs Reviewed  COMPREHENSIVE METABOLIC PANEL - Abnormal; Notable for the following components:      Result Value    Potassium 3.3 (*)    Creatinine, Ser 1.49 (*)    GFR, Estimated 35 (*)    All other components within normal limits  CBC - Abnormal; Notable for the following components:   Hemoglobin 11.7 (*)    HCT 35.6 (*)    All other components within normal limits  URINALYSIS, ROUTINE W REFLEX MICROSCOPIC - Abnormal; Notable for the following components:   APPearance HAZY (*)    Leukocytes,Ua SMALL (*)    Bacteria, UA RARE (*)    All other components within normal limits  LACTIC ACID, PLASMA - Abnormal; Notable for the following  components:   Lactic Acid, Venous 2.8 (*)    All other components within normal limits  GASTROINTESTINAL PANEL BY PCR, STOOL (REPLACES STOOL CULTURE)  C DIFFICILE QUICK SCREEN W PCR REFLEX    LIPASE, BLOOD  LACTIC ACID, PLASMA    EKG None  Radiology CT Angio Abd/Pel W and/or Wo Contrast  Result Date: 04/30/2022 CLINICAL DATA:  82 year old female with history of abdominal pain for the past 2 weeks. Evaluate for potential mesenteric ischemia. EXAM: CTA ABDOMEN AND PELVIS WITHOUT AND WITH CONTRAST TECHNIQUE: Multidetector CT imaging of the abdomen and pelvis was performed using the standard protocol during bolus administration of intravenous contrast. Multiplanar reconstructed images and MIPs were obtained and reviewed to evaluate the vascular anatomy. RADIATION DOSE REDUCTION: This exam was performed according to the departmental dose-optimization program which includes automated exposure control, adjustment of the mA and/or kV according to patient size and/or use of iterative reconstruction technique. CONTRAST:  2mL OMNIPAQUE IOHEXOL 350 MG/ML SOLN COMPARISON:  No priors. FINDINGS: VASCULAR Aorta: Atherosclerosis without evidence of aneurysm or dissection. Mild focal ectasia of the infrarenal abdominal aorta measuring up to 1.7 cm in diameter shortly above the level of the bifurcation. Celiac: Patent without evidence of aneurysm, dissection, vasculitis or significant  stenosis. SMA: Patent without evidence of aneurysm, dissection, vasculitis or significant stenosis. Renals: Mild stenosis in the proximal left renal artery related to eccentric noncalcified atheromatous plaque. Right renal artery is widely patent. IMA: Patent without evidence of aneurysm, dissection, vasculitis or significant stenosis. Inflow: Patent without evidence of aneurysm, dissection, vasculitis or significant stenosis. Proximal Outflow: Bilateral common femoral and visualized portions of the superficial and profunda femoral arteries are patent without evidence of aneurysm, dissection, vasculitis or significant stenosis. Veins: No obvious venous abnormality within the limitations of this arterial phase study. Review of the MIP images confirms the above findings. NON-VASCULAR Lower chest: Mild scarring in the visualize lung bases. Hepatobiliary: No suspicious cystic or solid hepatic lesions. No intra or extrahepatic biliary ductal dilatation. Gallbladder is unremarkable in appearance. Pancreas: No pancreatic mass. No pancreatic ductal dilatation. No pancreatic or peripancreatic fluid collections or inflammatory changes. Spleen: Unremarkable. Adrenals/Urinary Tract: 3.3 cm exophytic low-attenuation lesion in the posterior aspect of the lower pole of the right kidney, compatible with a simple (Bosniak class 1, no imaging follow-up recommended) cyst. Left kidney and bilateral adrenal glands are normal in appearance. No hydroureteronephrosis. Urinary bladder is unremarkable in appearance. Stomach/Bowel: The appearance of the stomach is normal. There is no pathologic dilatation of small bowel or colon. Normal appendix. Lymphatic: No lymphadenopathy noted in the abdomen or pelvis. Reproductive: Uterus and ovaries are unremarkable in appearance. Other: No significant volume of ascites.  No pneumoperitoneum. Musculoskeletal: There are no aggressive appearing lytic or blastic lesions noted in the visualized portions of  the skeleton. IMPRESSION: VASCULAR 1. No acute vascular abnormality noted in the abdomen or pelvis. Specifically, no imaging findings to suggest mesenteric ischemia. 2. Mild stenosis of the proximal left renal artery, unlikely to be hemodynamically significant. 3. Atherosclerosis throughout the abdominal aorta and pelvic vasculature, without evidence of aneurysm or dissection. NON-VASCULAR 1. No acute findings are noted in the abdomen or pelvis to account for the patient's symptoms. 2. Incidental findings, as above. Electronically Signed   By: Vinnie Langton M.D.   On: 04/30/2022 07:18    Procedures Procedures    Medications Ordered in ED Medications  lactated ringers bolus 500 mL (0 mLs Intravenous Stopped 04/30/22 0449)  fentaNYL (SUBLIMAZE) injection 50 mcg (50 mcg  Intravenous Given 04/30/22 0451)  iohexol (OMNIPAQUE) 350 MG/ML injection 75 mL (75 mLs Intravenous Contrast Given 04/30/22 0649)  fentaNYL (SUBLIMAZE) injection 50 mcg (50 mcg Intravenous Given 04/30/22 0720)    ED Course/ Medical Decision Making/ A&P                             Medical Decision Making Amount and/or Complexity of Data Reviewed Labs: ordered. Radiology: ordered.  Risk Prescription drug management.   Patient here for evaluation of abdominal pain, diarrhea for 1 to 2 weeks.  She does have generalized tenderness on examination.  CTA was obtained, which is negative for flow-limiting lesion or additional abnormality.  Labs with stable renal insufficiency, stable hemoglobin.  UA is not consistent with UTI.  Initial lactic acid is elevated 2.8.  Plan to recheck after IV fluid administration.  Patient care transferred pending repeat lactic acid, p.o. challenge.        Final Clinical Impression(s) / ED Diagnoses Final diagnoses:  Generalized abdominal pain    Rx / DC Orders ED Discharge Orders          Ordered    Ambulatory referral to Gastroenterology        04/30/22 0739              Quintella Reichert, MD 04/30/22 [phone]

## 2022-05-01 ENCOUNTER — Encounter: Payer: Self-pay | Admitting: Physician Assistant

## 2022-05-01 ENCOUNTER — Telehealth: Payer: Self-pay

## 2022-05-01 NOTE — Telephone Encounter (Signed)
Transition Care Management Unsuccessful Follow-up Telephone Call  Date of discharge and from where:  04/29/22  Attempts:  1st Attempt  Reason for unsuccessful TCM follow-up call:  Unable to leave message

## 2022-05-03 ENCOUNTER — Telehealth: Payer: Self-pay

## 2022-05-03 NOTE — Telephone Encounter (Signed)
     Patient  visit on 4/2  at [facility]   Have you been able to follow up with your primary care physician?  Yes   The patient was or was not able to obtain any needed medicine or equipment.  Yes   Are there diet recommendations that you are having difficulty following? Na   Patient expresses understanding of discharge instructions and education provided has no other needs at this time.  Yes      Donna Gilmore [facility] Care Guide, [place] [phone] [address], , [place] [address] Phone: [phone] Email: [email or web address]    

## 2022-05-15 ENCOUNTER — Ambulatory Visit: Payer: Self-pay

## 2022-05-15 ENCOUNTER — Encounter: Payer: Self-pay | Admitting: Nurse Practitioner

## 2022-05-15 NOTE — Patient Outreach (Signed)
  Care Coordination   05/15/2022 Name: Donna Gilmore MRN: [id number] DOB: [birth year]   Care Coordination Outreach Attempts:  An unsuccessful telephone outreach was attempted for a scheduled appointment today.  Follow Up Plan:  Additional outreach attempts will be made to offer the patient care coordination information and services.   Encounter Outcome:  No Answer   Care Coordination Interventions:  No, not indicated    Delsa Sale, RN, BSN, CCM Care Management Coordinator [facility] Care Management  Direct Phone: [phone]

## 2022-05-17 NOTE — Progress Notes (Unsigned)
05/21/2022 Donna Gilmore [id number] [birth year]  Referring provider: Arnette Felts, FNP Primary GI doctor: Dr. Marina Goodell  ASSESSMENT AND PLAN:    Abdominal pain more in epigastric area worse after food, several months of loose stools Colonoscopy normal 07/2017 no recall due to age Labs with normocytic anemia, normal iron 2/12 No dysphagia, no weight loss, no red flag symptoms, normal CTA 04/02 Uncertain if this is infectious versus GERD, gastroparesis, constipation/overflow diarrhea. Try to get Diathe[employer] stool test here however we were out, we will get C. difficile/stool culture/pancreatic elastase, patient is unable to complete will call our office and we will try to set up the Diatherix stool test in the office for the patient. Will get labs to evaluate for anemia, inflammation Will get x-ray to evaluate for stool burden Will do trial of Protonix 40 mg once daily Will get upper GI, patient high risk for endoscopic procedures and prefers not to have endoscopic procedures done if not needed.  History of CVA On Plavix  Patient Care Team: Arnette Felts, FNP as PCP - General (General Practice) Arnette Felts, FNP (General Practice) Little, Karma Lew, RN as [facility] Care Management Hum[place], Enrique Sack as [facility] Care Management  HISTORY OF PRESENT ILLNESS: 82 y.o. female with a past medical history of HOH, hypertension, hyperlipidemia, CVA on Plavix therapy, vertigo, and others listed below presents for evaluation of abdominal pain.    08/05/2017 colonoscopy Dr. Marina Goodell Heme positive stool 3 mm TA polyp ascending colon, internal hemorrhoids, no recall due to age.  04/30/2022 CTA abdomen pelvis for abdominal pain 2 weeks no acute vascular abnormality no acute abdominal findings 04/29/2022 labs reviewed show no leukocytosis, Hgb 11.7, MCV 90.1, potassium 3.3, BUN 10, creatinine 1.49, 03/11/2022 normal iron, ferritin, saturation.   Daughter tracy here with her,  give some of the history, patient very hard of hearing and slightly difficult historian. Having issues since Feb  She feels AB cramping with epigastric burning AB pain, worse after food, has been for several months associated with diarrhea/loose stools.  She will eat 2 meals a day. Was having diarrhea/loose stools up to 3 x a day occ fecal incontinence, now having every loose stools every 2-3 days but in between still has feeling of BM.  For about a week or two, she has been having the feeling to have BM but unable to have BM. She is passing less gas or can have liquid stools with BM.  Has harder upper AB with bloating and pain.  Denies GERD, nausea, vomiting, dysphagia.  She denies weight loss, has had weight gain.   She reports blood thinner use, plavix. She denies NSAID use.  She denies ETOH use.   She denies tobacco use.  She denies drug use.    She  reports that she quit smoking about 5 years ago. Her smoking use included cigarettes. She has a 9.00 pack-year smoking history. She has never used smokeless tobacco. She reports that she does not currently use alcohol. She reports that she does not use drugs.  RELEVANT LABS AND IMAGING: CBC    Component Value Date/Time   WBC 7.7 04/29/2022 1848   RBC 3.95 04/29/2022 1848   HGB 11.7 (L) 04/29/2022 1848   HGB 11.7 03/11/2022 1219   HCT 35.6 (L) 04/29/2022 1848   HCT 35.9 03/11/2022 1219   PLT 237 04/29/2022 1848   PLT 262 03/11/2022 1219   MCV 90.1 04/29/2022 1848   MCV 87 03/11/2022 1219   MCH  29.6 04/29/2022 1848   MCHC 32.9 04/29/2022 1848   RDW 14.8 04/29/2022 1848   RDW 14.1 03/11/2022 1219   LYMPHSABS 2.2 09/30/2013 0920   MONOABS 0.5 09/30/2013 0920   EOSABS 0.1 09/30/2013 0920   BASOSABS 0.0 09/30/2013 0920   Recent Labs    10/04/21 1159 03/11/22 1219 04/29/22 1848  HGB 11.5 11.7 11.7*    CMP     Component Value Date/Time   NA 138 04/29/2022 1848   NA 139 03/11/2022 1219   K 3.3 (L) 04/29/2022 1848   CL 103  04/29/2022 1848   CO2 24 04/29/2022 1848   GLUCOSE 91 04/29/2022 1848   BUN 10 04/29/2022 1848   BUN 14 03/11/2022 1219   CREATININE 1.49 (H) 04/29/2022 1848   CALCIUM 9.5 04/29/2022 1848   PROT 7.1 04/29/2022 1848   PROT 6.8 10/04/2021 1159   ALBUMIN 3.9 04/29/2022 1848   ALBUMIN 4.3 10/04/2021 1159   AST 28 04/29/2022 1848   ALT 16 04/29/2022 1848   ALKPHOS 87 04/29/2022 1848   BILITOT 0.4 04/29/2022 1848   BILITOT 0.3 10/04/2021 1159   GFRNONAA 35 (L) 04/29/2022 1848   GFRAA 56 (L) 10/19/2019 1619      Latest Ref Rng & Units 04/29/2022    6:48 PM 10/04/2021   11:59 AM 09/25/2020   11:33 AM  Hepatic Function  Total Protein 6.5 - 8.1 g/dL 7.1  6.8  7.2   Albumin 3.5 - 5.0 g/dL 3.9  4.3  4.3   AST 15 - 41 U/L ALT 0 - 44 U/L Alk Phosphatase 38 - 126 U/L 87  106  114   Total Bilirubin 0.3 - 1.2 mg/dL 0.4  0.3  0.2       Current Medications:   Current Outpatient Medications (Endocrine & Metabolic):    Semaglutide (RYBELSUS) 7 MG TABS, Take 1 tablet by mouth daily.   Current Outpatient Medications (Cardiovascular):    atorvastatin (LIPITOR) 80 MG tablet, TAKE 1 TABLET BY MOUTH ONCE DAILY   ezetimibe (ZETIA) 10 MG tablet, Take 1 tablet (10 mg total) by mouth daily.   hydrochlorothiazide (HYDRODIURIL) 12.5 MG tablet,    olmesartan-hydrochlorothiazide (BENICAR HCT) 40-12.5 MG tablet, TAKE 1 TABLET BY MOUTH DAILY   Current Outpatient Medications (Respiratory):    Azelastine-Fluticasone (DYMISTA) 137-50 MCG/ACT SUSP, Place 1 spray into the nose in the morning and at bedtime.   Budeson-Glycopyrrol-Formoterol (BREZTRI AEROSPHERE) 160-9-4.8 MCG/ACT AERO, USE 2 INHALATIONS BY MOUTH  DAILY   levocetirizine (XYZAL) 5 MG tablet, Take 1 tablet (5 mg total) by mouth every evening.   Current Outpatient Medications (Analgesics):    acetaminophen (TYLENOL) 500 MG tablet, Take 1 tablet (500 mg total) by mouth every 6 (six) hours as needed.   Current Outpatient  Medications (Hematological):    clopidogrel (PLAVIX) 75 MG tablet, TAKE 1 TABLET BY MOUTH DAILY   Current Outpatient Medications (Other):    azelastine (OPTIVAR) 0.05 % ophthalmic solution, Place 1 drop into both eyes 2 (two) times daily.   cholecalciferol (VITAMIN D3) 25 MCG (1000 UNIT) tablet, Take 1,000 Units by mouth daily.   citalopram (CELEXA) 20 MG tablet, Take 0.5 tablets (10 mg total) by mouth daily.   diazepam (VALIUM) 5 MG tablet, Take 1 tablet (5 mg total) by mouth every 8 (eight) hours as needed for anxiety.   donepezil (ARICEPT) 10 MG tablet, TAKE 1 TABLET BY MOUTH AT  BEDTIME  fluorometholone (FML) 0.1 % ophthalmic suspension, 1 drop 4 (four) times daily.   gabapentin (NEURONTIN) 100 MG capsule, TAKE 1 CAPSULE BY MOUTH 3 TIMES  DAILY AS NEEDED   glucose blood (ONETOUCH ULTRA) test strip, USE WITH METER TO CHECK BLOOD SUGAR BEFORE BREAKFAST AND BEFORE DINNER   meclizine (ANTIVERT) 25 MG tablet, TAKE 1 TABLET BY MOUTH 2  TIMES EVERY DAY AS NEEDED   pantoprazole (PROTONIX) 40 MG tablet, Take 1 tablet (40 mg total) by mouth daily.   pimecrolimus (ELIDEL) 1 % cream, Apply thin layer to the skin as needed twice a day for itching or rash   Probiotic Product (PROBIOTIC-10 PO), Take by mouth. Take one tablet daily   vitamin C (ASCORBIC ACID) 500 MG tablet, Take 500 mg by mouth daily.   VITAMIN D PO, Take 1 tablet by mouth daily.   cyclobenzaprine (FLEXERIL) 5 MG tablet,    diclofenac Sodium (VOLTAREN) 1 % GEL, Apply topically 4 (four) times daily. (Patient not taking: Reported on 05/21/2022)   Melatonin 5 MG CAPS, Take 1 capsule (5 mg total) by mouth daily after supper. (Patient not taking: Reported on 05/21/2022)   ondansetron (ZOFRAN) 4 MG tablet, Take 1 tablet (4 mg total) by mouth every 8 (eight) hours as needed for nausea or vomiting. (Patient not taking: Reported on 05/21/2022)  Current Facility-Administered Medications (Other):    0.9 %  sodium chloride infusion  Medical  History:  Past Medical History:  Diagnosis Date   Benign paroxysmal positional vertigo 08/26/2012   CVA (cerebral infarction) 08/25/2012   Diabetes mellitus without complication    Dyslipidemia 08/25/2012   Essential hypertension, benign 08/25/2012   Hyperlipidemia    Hypertension    Meralgia paraesthetica, left 01/12/2018   Stroke    Allergies: No Known Allergies   Surgical History:  She  has no past surgical history on file. Family History:  Her family history includes Alzheimer's disease in her father, mother, paternal grandfather, and paternal grandmother; Diabetes in her maternal grandfather, maternal grandmother, and mother; Kidney failure in her mother; Prostate cancer in her father and paternal uncle.  REVIEW OF SYSTEMS  : All other systems reviewed and negative except where noted in the History of Present Illness.  PHYSICAL EXAM: BP 138/78 (BP Location: Left Arm, Patient Position: Sitting, Cuff Size: Normal)   Pulse 62   Ht 5\' 3"  (1.6 m)   Wt 186 lb 6 oz (84.5 kg)   SpO2 98%   BMI 33.01 kg/m  General Appearance: well nourishing, HOH, in no apparent distress. Head:   Normocephalic and atraumatic. Eyes:  sclerae anicteric,conjunctive pink  Respiratory: Respiratory effort normal, BS equal bilaterally without rales, rhonchi, wheezing. Cardio: RRR with no MRGs. Peripheral pulses intact.  Abdomen: Soft,  Obese ,active bowel sounds. mild tenderness in the entire abdomen. Without guarding and Without rebound. No masses. Rectal: Not evaluated Musculoskeletal: Full ROM, Wide based and Antalgic gait. Without edema. Skin:  Dry and intact without significant lesions or rashes Neuro: Alert and  oriented x4;  No focal deficits. Psych:  Cooperative. Normal mood and affect.    Doree Albee, PA-C 3:16 PM

## 2022-05-20 ENCOUNTER — Encounter (HOSPITAL_COMMUNITY): Payer: Self-pay

## 2022-05-20 ENCOUNTER — Ambulatory Visit (HOSPITAL_COMMUNITY)
Admission: EM | Admit: 2022-05-20 | Discharge: 2022-05-20 | Disposition: A | Discharge status: Home / Self Care | Payer: Medicare Other | Attending: Physician Assistant | Admitting: Physician Assistant

## 2022-05-20 DIAGNOSIS — H6121 Impacted cerumen, right ear: Secondary | ICD-10-CM | POA: Diagnosis not present

## 2022-05-20 NOTE — Discharge Instructions (Addendum)
Recommend Debrox which you can get over the counter. Use this for the next few days as the wax is too hard for [place] to remove today.  If no improvement you can follow up with your primary care physician.

## 2022-05-20 NOTE — ED Triage Notes (Signed)
Patient states she went for a hearing test and was told to have her ears cleaned out so the hearing test could be performed.

## 2022-05-20 NOTE — ED Provider Notes (Signed)
[facility]    CSN: [id number] Arrival date & time: 05/20/22  1610      History   Chief Complaint Chief Complaint  Patient presents with   ear wax removal    HPI Donna Gilmore is a 82 y.o. female.   Patient reports she was seen for hearing test and was advised to have her ears cleaned out.  Reports they could not do the hearing test due to cerumen impaction.  She denies ear pain.  She wears hearing aids.  She has tried nothing at home.    Past Medical History:  Diagnosis Date   Benign paroxysmal positional vertigo 08/26/2012   CVA (cerebral infarction) 08/25/2012   Diabetes mellitus without complication    Dyslipidemia 08/25/2012   Essential hypertension, benign 08/25/2012   Hyperlipidemia    Hypertension    Meralgia paraesthetica, left 01/12/2018   Stroke     Patient Active Problem List   Diagnosis Date Noted   Anxiety 03/14/2022   Moderate episode of recurrent major depressive disorder 03/14/2022   Generalized weakness 03/14/2022   Nocturia more than twice per night 11/20/2021   Night-waking disorder, delayed sleep phase type 11/20/2021   REM sleep behavior disorder 11/20/2021   Chronic obstructive pulmonary disease 11/20/2021   Moderate obstructive sleep apnea-hypopnea syndrome 11/20/2021   Chronic rhinitis 09/04/2020   COPD with asthma 02/25/2019   Peripheral vascular disease 02/25/2019   Pain in left foot 08/21/2018   Chronic bilateral low back pain without sciatica 06/01/2018   Class 1 obesity due to excess calories without serious comorbidity with body mass index (BMI) of 32.0 to 32.9 in adult 06/01/2018   Acute left-sided low back pain with left-sided sciatica 05/04/2018   Shortness of breath 05/04/2018   Tinnitus, bilateral 03/18/2018   Meralgia paraesthetica, left 01/12/2018   Parotid mass 01/10/2017   Sensorineural hearing loss (SNHL) of both ears 01/10/2017   Mild cognitive impairment 10/14/2016   History of stroke 10/14/2016   History  of syphilis 10/14/2016   Mixed hyperlipidemia 10/14/2016   Insomnia 10/14/2016   Cerebellar stroke syndrome 10/23/2013   Smoker 02/16/2013   Chest pain 09/21/2012   Tobacco abuse disorder 09/21/2012   Benign paroxysmal positional vertigo 08/26/2012   CVA (cerebral infarction) 08/25/2012   Essential hypertension 08/25/2012   Dyslipidemia 08/25/2012    History reviewed. No pertinent surgical history.  OB History   No obstetric history on file.      Home Medications    Prior to Admission medications   Medication Sig Start Date End Date Taking? Authorizing Provider  acetaminophen (TYLENOL) 500 MG tablet Take 1 tablet (500 mg total) by mouth every 6 (six) hours as needed. 08/13/18   Lamptey, Britta Mccreedy, MD  albuterol (VENTOLIN HFA) 108 (90 Base) MCG/ACT inhaler Inhale 2 puffs into the lungs every 6 (six) hours as needed for wheezing or shortness of breath. Patient not taking: Reported on 04/29/2022 07/04/20   Oretha Milch, MD  atorvastatin (LIPITOR) 80 MG tablet TAKE 1 TABLET BY MOUTH ONCE DAILY 09/03/21   Arnette Felts, FNP  azelastine (OPTIVAR) 0.05 % ophthalmic solution Place 1 drop into both eyes 2 (two) times daily. Patient not taking: Reported on 03/26/2022 02/06/22   Marcelyn Bruins, MD  Azelastine-Fluticasone [facility]) 137-50 MCG/ACT SUSP Place 1 spray into the nose in the morning and at bedtime. 04/18/21   Marcelyn Bruins, MD  Budeson-Glycopyrrol-Formoterol (BREZTRI AEROSPHERE) 160-9-4.8 MCG/ACT AERO USE 2 INHALATIONS BY MOUTH  DAILY 03/11/22   Christell Constant,  Lolita Cram, FNP  cholecalciferol (VITAMIN D3) 25 MCG (1000 UNIT) tablet Take 1,000 Units by mouth daily.    [provider]  citalopram (CELEXA) 20 MG tablet Take 0.5 tablets (10 mg total) by mouth daily. 03/11/22 03/11/23  Arnette Felts, FNP  clopidogrel (PLAVIX) 75 MG tablet TAKE 1 TABLET BY MOUTH DAILY 11/23/21   Arnette Felts, FNP  cyclobenzaprine (FLEXERIL) 5 MG tablet     [provider]  diazepam  (VALIUM) 5 MG tablet Take 1 tablet (5 mg total) by mouth every 8 (eight) hours as needed for anxiety. 03/11/22   Arnette Felts, FNP  diclofenac Sodium (VOLTAREN) 1 % GEL Apply topically 4 (four) times daily.    [provider]  donepezil (ARICEPT) 10 MG tablet TAKE 1 TABLET BY MOUTH AT  BEDTIME 04/18/22   Arnette Felts, FNP  ezetimibe (ZETIA) 10 MG tablet Take 1 tablet (10 mg total) by mouth daily. 09/07/21 09/02/22  Chrystie Nose, MD  fluorometholone (FML) 0.1 % ophthalmic suspension 1 drop 4 (four) times daily. Patient not taking: Reported on 03/26/2022 08/02/21   [provider]  gabapentin (NEURONTIN) 100 MG capsule TAKE 1 CAPSULE BY MOUTH 3 TIMES  DAILY AS NEEDED 04/19/22   Arnette Felts, FNP  glucose blood (ONETOUCH ULTRA) test strip USE WITH METER TO CHECK BLOOD SUGAR BEFORE BREAKFAST AND BEFORE DINNER 10/22/21   Arnette Felts, FNP  hydrochlorothiazide (HYDRODIURIL) 12.5 MG tablet     [provider]  levocetirizine (XYZAL) 5 MG tablet Take 1 tablet (5 mg total) by mouth every evening. 12/28/19   Arnette Felts, FNP  meclizine (ANTIVERT) 25 MG tablet TAKE 1 TABLET BY MOUTH 2  TIMES EVERY DAY AS NEEDED 02/11/22   Arnette Felts, FNP  Melatonin 5 MG CAPS Take 1 capsule (5 mg total) by mouth daily after supper. 11/20/21   Dohmeier, Porfirio Mylar, MD  olmesartan-hydrochlorothiazide (BENICAR HCT) 40-12.5 MG tablet TAKE 1 TABLET BY MOUTH DAILY 09/12/21   Arnette Felts, FNP  ondansetron (ZOFRAN) 4 MG tablet Take 1 tablet (4 mg total) by mouth every 8 (eight) hours as needed for nausea or vomiting. 04/30/22   Terrilee Files, MD  pimecrolimus (ELIDEL) 1 % cream Apply thin layer to the skin as needed twice a day for itching or rash 09/20/21   Marcelyn Bruins, MD  Probiotic Product (PROBIOTIC-10 PO) Take by mouth. Take one tablet daily    [provider]  Semaglutide (RYBELSUS) 7 MG TABS Take 1 tablet by mouth daily. 02/12/22   Arnette Felts, FNP  vitamin C (ASCORBIC ACID) 500  MG tablet Take 500 mg by mouth daily.    [provider]  VITAMIN D PO Take 1 tablet by mouth daily.    [provider]    Family History Family History  Problem Relation Age of Onset   Kidney failure Mother    Diabetes Mother    Alzheimer's disease Mother    Alzheimer's disease Father    Prostate cancer Father    Diabetes Maternal Grandmother    Diabetes Maternal Grandfather    Alzheimer's disease Paternal Grandmother    Alzheimer's disease Paternal Grandfather    Prostate cancer Paternal Uncle     Social History Social History   Tobacco Use   Smoking status: Former    Packs/day: 0.30    Years: 30.00    Additional pack years: 0.00    Total pack years: 9.00    Types: Cigarettes    Quit date: 2019    Years  since quitting: 5.3   Smokeless tobacco: Never  Vaping Use   Vaping Use: Never used  Substance Use Topics   Alcohol use: Not Currently    Comment: occasional   Drug use: No     Allergies   Patient has no known allergies.   Review of Systems Review of Systems  Constitutional:  Negative for chills and fever.  HENT:  Negative for ear pain and sore throat.   Eyes:  Negative for pain and visual disturbance.  Respiratory:  Negative for cough and shortness of breath.   Cardiovascular:  Negative for chest pain and palpitations.  Gastrointestinal:  Negative for abdominal pain and vomiting.  Genitourinary:  Negative for dysuria and hematuria.  Musculoskeletal:  Negative for arthralgias and back pain.  Skin:  Negative for color change and rash.  Neurological:  Negative for seizures and syncope.  All other systems reviewed and are negative.    Physical Exam Triage Vital Signs ED Triage Vitals [05/20/22 1745]  Enc Vitals Group     BP 133/77     Pulse Rate (!) 57     Resp 16     Temp (!) 97.5 F (36.4 C)     Temp Source Oral     SpO2 93 %     Weight      Height      Head Circumference      Peak Flow      Pain Score      Pain Loc       Pain Edu?      Excl. in GC?    No data found.  Updated Vital Signs BP 133/77 (BP Location: Right Arm)   Pulse (!) 57   Temp (!) 97.5 F (36.4 C) (Oral)   Resp 16   SpO2 93%   Visual Acuity Right Eye Distance:   Left Eye Distance:   Bilateral Distance:    Right Eye Near:   Left Eye Near:    Bilateral Near:     Physical Exam Vitals and nursing note reviewed.  Constitutional:      General: She is not in acute distress.    Appearance: She is well-developed.  HENT:     Head: Normocephalic and atraumatic.     Ears:     Comments: Dried cerumen noted in right ear canal, TM partially visualized. Eyes:     Conjunctiva/sclera: Conjunctivae normal.  Cardiovascular:     Rate and Rhythm: Normal rate and regular rhythm.     Heart sounds: No murmur heard. Pulmonary:     Effort: Pulmonary effort is normal. No respiratory distress.     Breath sounds: Normal breath sounds.  Abdominal:     Palpations: Abdomen is soft.     Tenderness: There is no abdominal tenderness.  Musculoskeletal:        General: No swelling.     Cervical back: Neck supple.  Skin:    General: Skin is warm and dry.     Capillary Refill: Capillary refill takes less than 2 seconds.  Neurological:     Mental Status: She is alert.  Psychiatric:        Mood and Affect: Mood normal.      UC Treatments / Results  Labs (all labs ordered are listed, but only abnormal results are displayed) Labs Reviewed - No data to display  EKG   Radiology No results found.  Procedures Procedures (including critical care time)  Medications Ordered in UC Medications - No data to  display  Initial Impression / Assessment and Plan / UC Course  I have reviewed the triage vital signs and the nursing notes.  Pertinent labs & imaging results that were available during my care of the patient were reviewed by me and considered in my medical decision making (see chart for details).     Right ear lavage in clinic today, I  can visualize the TM.  Some very dry wax that will need to be softened with Debrox before removal.  Discussed with patient.  Final Clinical Impressions(s) / UC Diagnoses   Final diagnoses:  Impacted cerumen of right ear     Discharge Instructions      Recommend Debrox which you can get over the counter. Use this for the next few days as the wax is too hard for [place] to remove today.  If no improvement you can follow up with your primary care physician.      ED Prescriptions   None    PDMP not reviewed this encounter.   Ward, Tylene Fantasia, PA-C 05/20/22 [phone]

## 2022-05-21 ENCOUNTER — Ambulatory Visit: Payer: Medicare Other | Admitting: Physician Assistant

## 2022-05-21 ENCOUNTER — Other Ambulatory Visit (INDEPENDENT_AMBULATORY_CARE_PROVIDER_SITE_OTHER): Payer: Medicare Other

## 2022-05-21 ENCOUNTER — Encounter: Payer: Self-pay | Admitting: Physician Assistant

## 2022-05-21 VITALS — BP 138/78 | HR 62 | Ht 63.0 in | Wt 186.4 lb

## 2022-05-21 DIAGNOSIS — R197 Diarrhea, unspecified: Secondary | ICD-10-CM | POA: Diagnosis not present

## 2022-05-21 DIAGNOSIS — R198 Other specified symptoms and signs involving the digestive system and abdomen: Secondary | ICD-10-CM

## 2022-05-21 DIAGNOSIS — R1013 Epigastric pain: Secondary | ICD-10-CM

## 2022-05-21 LAB — COMPREHENSIVE METABOLIC PANEL
ALT: 12 U/L (ref 0–35)
AST: 16 U/L (ref 0–37)
Albumin: 4.2 g/dL (ref 3.5–5.2)
Alkaline Phosphatase: 106 U/L (ref 39–117)
BUN: 12 mg/dL (ref 6–23)
CO2: 25 mEq/L (ref 19–32)
Calcium: 9.4 mg/dL (ref 8.4–10.5)
Chloride: 104 mEq/L (ref 96–112)
Creatinine, Ser: 1.55 mg/dL — ABNORMAL HIGH (ref 0.40–1.20)
GFR: 31.1 mL/min — ABNORMAL LOW (ref >60.00)
Glucose, Bld: 127 mg/dL — ABNORMAL HIGH (ref 70–99)
Potassium: 3.7 mEq/L (ref 3.5–5.1)
Sodium: 139 mEq/L (ref 135–145)
Total Bilirubin: 0.2 mg/dL (ref 0.2–1.2)
Total Protein: 7.2 g/dL (ref 6.0–8.3)

## 2022-05-21 LAB — CBC WITH DIFFERENTIAL/PLATELET
Basophils Absolute: 0 10*3/uL (ref 0.0–0.1)
Basophils Relative: 0.6 % (ref 0.0–3.0)
Eosinophils Absolute: 0.2 10*3/uL (ref 0.0–0.7)
Eosinophils Relative: 2.8 % (ref 0.0–5.0)
HCT: 34.2 % — ABNORMAL LOW (ref 36.0–46.0)
Hemoglobin: 11.1 g/dL — ABNORMAL LOW (ref 12.0–15.0)
Lymphocytes Relative: 33 % (ref 12.0–46.0)
Lymphs Abs: 2.2 10*3/uL (ref 0.7–4.0)
MCHC: 32.5 g/dL (ref 30.0–36.0)
MCV: 88.6 fl (ref 78.0–100.0)
Monocytes Absolute: 0.5 10*3/uL (ref 0.1–1.0)
Monocytes Relative: 7 % (ref 3.0–12.0)
Neutro Abs: 3.8 10*3/uL (ref 1.4–7.7)
Neutrophils Relative %: 56.6 % (ref 43.0–77.0)
Platelets: 226 10*3/uL (ref 150.0–400.0)
RBC: 3.86 Mil/uL — ABNORMAL LOW (ref 3.87–5.11)
RDW: 14.9 % (ref 11.5–15.5)
WBC: 6.7 10*3/uL (ref 4.0–10.5)

## 2022-05-21 LAB — SEDIMENTATION RATE: Sed Rate: 57 mm/hr — ABNORMAL HIGH (ref 0–30)

## 2022-05-21 MED ORDER — PANTOPRAZOLE SODIUM 40 MG PO TBEC
40.0000 mg | DELAYED_RELEASE_TABLET | Freq: Every day | ORAL | 3 refills | Status: DC
Start: 2022-05-21 — End: 2022-10-14

## 2022-05-21 NOTE — Progress Notes (Signed)
Noted  

## 2022-05-21 NOTE — Patient Instructions (Signed)
_______________________________________________________  If your blood pressure at your visit was 140/90 or greater, please contact your primary care physician to follow up on this.  _______________________________________________________  If you are age 82 or older, your body mass index should be between 23-30. Your Body mass index is 33.01 kg/m. If this is out of the aforementioned range listed, please consider follow up with your Primary Care Provider.  If you are age 68 or younger, your body mass index should be between 19-25. Your Body mass index is 33.01 kg/m. If this is out of the aformentioned range listed, please consider follow up with your Primary Care Provider.   ________________________________________________________  The [facility] providers would like to encourage you to use [facility] to communicate with providers for non-urgent requests or questions.  Due to long hold times on the telephone, sending your provider a message by [facility] may be a faster and more efficient way to get a response.  Please allow 48 business hours for a response.  Please remember that this is for non-urgent requests.  _______________________________________________________  Your provider has requested that you go to the basement level for lab work before leaving today. Press "B" on the elevator. The lab is located at the first door on the left as you exit the elevator.  Your provider has requested that you go to the basement level for lab work and an x ray before leaving today. Press "B" on the elevator. The lab is located at the first door on the left as you exit the elevator.  You have been scheduled for an Upper GI Series at [facility]. Your appointment is on 05/23/2022 at 9:00am. Please arrive 15 minutes prior to your test for registration. Make sure not to eat or drink anything after midnight on the night before your test. If you need to reschedule, please call radiology at  [phone]. ________________________________________________________________ An upper GI series uses x rays to help diagnose problems of the upper GI tract, which includes the esophagus, stomach, and duodenum. The duodenum is the first part of the small intestine. An upper GI series is conducted by a radiology technologist or a radiologist--a doctor who specializes in x-ray imaging--at a hospital or outpatient center. While sitting or standing in front of an x-ray machine, the patient drinks barium liquid, which is often white and has a chalky consistency and taste. The barium liquid coats the lining of the upper GI tract and makes signs of disease show up more clearly on x rays. X-ray video, called fluoroscopy, is used to view the barium liquid moving through the esophagus, stomach, and duodenum. Additional x rays and fluoroscopy are performed while the patient lies on an x-ray table. To fully coat the upper GI tract with barium liquid, the technologist or radiologist may press on the abdomen or ask the patient to change position. Patients hold still in various positions, allowing the technologist or radiologist to take x rays of the upper GI tract at different angles. If a technologist conducts the upper GI series, a radiologist will later examine the images to look for problems.  This test typically takes about 1 hour to complete. __________________________________________________________________

## 2022-05-22 ENCOUNTER — Other Ambulatory Visit: Payer: Self-pay

## 2022-05-22 ENCOUNTER — Other Ambulatory Visit (INDEPENDENT_AMBULATORY_CARE_PROVIDER_SITE_OTHER): Payer: Medicare Other

## 2022-05-22 DIAGNOSIS — D649 Anemia, unspecified: Secondary | ICD-10-CM

## 2022-05-22 LAB — IRON: Iron: 47 ug/dL (ref 42–145)

## 2022-05-22 LAB — VITAMIN B12: Vitamin B-12: 792 pg/mL (ref 211–911)

## 2022-05-22 LAB — FERRITIN: Ferritin: 34.7 ng/mL (ref 10.0–291.0)

## 2022-05-23 ENCOUNTER — Ambulatory Visit: Payer: Medicare Other

## 2022-05-23 ENCOUNTER — Ambulatory Visit (HOSPITAL_COMMUNITY)
Admission: RE | Admit: 2022-05-23 | Discharge: 2022-05-23 | Disposition: A | Discharge status: Home / Self Care | Payer: Medicare Other | Source: Ambulatory Visit | Attending: Physician Assistant | Admitting: Physician Assistant

## 2022-05-23 DIAGNOSIS — R1013 Epigastric pain: Secondary | ICD-10-CM

## 2022-05-23 DIAGNOSIS — R197 Diarrhea, unspecified: Secondary | ICD-10-CM | POA: Diagnosis not present

## 2022-05-24 ENCOUNTER — Telehealth: Payer: Self-pay | Admitting: *Deleted

## 2022-05-24 LAB — CLOSTRIDIUM DIFFICILE TOXIN B, QUALITATIVE, REAL-TIME PCR: Toxigenic C. Difficile by PCR: NOT DETECTED

## 2022-05-24 NOTE — Progress Notes (Signed)
  Care Coordination Note  05/24/2022 Name: Donna Gilmore MRN: [id number] DOB: 1940-05-12  Donna Gilmore is a 82 y.o. year old female who is a primary care patient of Arnette Felts, FNP and is actively engaged with the care management team. I reached out to [employer] by phone today to assist with re-scheduling a follow up visit with the RN Case Manager  Follow up plan: Unsuccessful telephone outreach attempt made.   [facility]  Care Coordination Care Guide  Direct Dial: [phone]

## 2022-05-25 LAB — STOOL CULTURE

## 2022-05-27 LAB — STOOL CULTURE: E coli, Shiga toxin Assay: NEGATIVE

## 2022-05-29 LAB — PANCREATIC ELASTASE, FECAL: Pancreatic Elastase-1, Stool: <15 mcg/g — ABNORMAL LOW

## 2022-05-30 NOTE — Progress Notes (Signed)
  Care Coordination Note  05/30/2022 Name: [name] MRN: [id number] DOB: [birth year]  Donna Gilmore is a 82 y.o. year old female who is a primary care patient of Arnette Felts, FNP and is actively engaged with the care management team. I reached out to [employer] by phone today to assist with re-scheduling a follow up visit with the RN Case Manager  Follow up plan: Unsuccessful telephone outreach attempt made. A HIPAA compliant phone message was left for the patient providing contact information and requesting a return call. Called daughter Lucendia Herrlich gave contact information to give to patient.   [facility]  Care Coordination Care Guide  Direct Dial: [phone]

## 2022-06-06 ENCOUNTER — Other Ambulatory Visit: Payer: Self-pay

## 2022-06-06 ENCOUNTER — Ambulatory Visit: Payer: Medicare Other | Admitting: Allergy

## 2022-06-06 ENCOUNTER — Other Ambulatory Visit: Payer: Self-pay | Admitting: Nurse Practitioner

## 2022-06-06 ENCOUNTER — Encounter: Payer: Self-pay | Admitting: Allergy

## 2022-06-06 ENCOUNTER — Other Ambulatory Visit: Payer: Self-pay | Admitting: Allergy

## 2022-06-06 VITALS — BP 116/70 | HR 50 | Temp 97.8°F | Resp 18

## 2022-06-06 DIAGNOSIS — H1013 Acute atopic conjunctivitis, bilateral: Secondary | ICD-10-CM | POA: Diagnosis not present

## 2022-06-06 DIAGNOSIS — H01135 Eczematous dermatitis of left lower eyelid: Secondary | ICD-10-CM

## 2022-06-06 DIAGNOSIS — H01132 Eczematous dermatitis of right lower eyelid: Secondary | ICD-10-CM

## 2022-06-06 DIAGNOSIS — J302 Other seasonal allergic rhinitis: Secondary | ICD-10-CM

## 2022-06-06 DIAGNOSIS — H01131 Eczematous dermatitis of right upper eyelid: Secondary | ICD-10-CM

## 2022-06-06 DIAGNOSIS — J3089 Other allergic rhinitis: Secondary | ICD-10-CM | POA: Diagnosis not present

## 2022-06-06 DIAGNOSIS — H01134 Eczematous dermatitis of left upper eyelid: Secondary | ICD-10-CM

## 2022-06-06 MED ORDER — PIMECROLIMUS 1 % EX CREA: TOPICAL_CREAM | CUTANEOUS | 5 refills | Status: DC

## 2022-06-06 MED ORDER — OLOPATADINE HCL 0.6 % NA SOLN: NASAL | 5 refills | Status: DC

## 2022-06-06 MED ORDER — LEVOCETIRIZINE DIHYDROCHLORIDE 5 MG PO TABS: ORAL_TABLET | ORAL | 5 refills | Status: DC

## 2022-06-06 MED ORDER — ONETOUCH ULTRA VI STRP: ORAL_STRIP | 0 refills | Status: DC

## 2022-06-06 MED ORDER — AZELASTINE HCL 0.05 % OP SOLN: 1.0000 [drp] | Freq: Two times a day (BID) | OPHTHALMIC | 5 refills | Status: DC

## 2022-06-06 NOTE — Patient Instructions (Addendum)
-   Continue avoidance measures for dust mites and cockroach.  - Air purifiers help to purify the air you breathe in and reduce your allergen load in the home.  - Dymista nasal spray and Atrovent nasal spray have not been effective - try Patanase nasal spray 2 sprays each nostril twice a day as needed for runny/itchy nose - use Xyzal 1-2 tabs a day to help control allergy symptoms and itch - use Optivar eye drop 1 drop each eye twice a day as needed for itchy/watery eyes - can use small amount of vaseline on qtip to apply to the nose to help decrease dryness - for itching/rash of the eyelid and face use Elidel ointment apply thin layer to the skin as needed twice a day for itching or rash  Follow-up in 4-6 months or sooner if needed

## 2022-06-06 NOTE — Progress Notes (Signed)
Follow-up Note  RE: Donna Gilmore MRN: [id number] DOB: 10-25-1940 Date of Office Visit: 06/06/2022   History of present illness: Donna Gilmore is a 82 y.o. female presenting today for follow-up of allergic rhinitis with conjunctivitis and eyelid dermatitis.  She was last seen in the office on 02/06/22 by myself.  She is not noticed any improvement in symptoms.  She states she is still having itchy and runny nose, itchy eyes and dry mouth.  The atrovent nasal spray did not help.  Dymista prior did not help.  She is using Systane eyedrops. She also does not feel the optivar eye drop helps.  She is taking xyzal and maybe helps a bit but not enough.   The elidel ointment helps some but she still notes itch of the nose and eyelids.  She states she has someone come clean her home weekly that included vacuuming.  She has someone wash her bedding every other week.  She states her bed is new within past several months.    Review of systems: Review of Systems  Constitutional: Negative.   HENT:         See HPI  Eyes:        See HPI  Respiratory: Negative.    Cardiovascular: Negative.   Gastrointestinal: Negative.   Musculoskeletal: Negative.   Skin:        See HPI  Allergic/Immunologic: Negative.   Neurological: Negative.      All other systems negative unless noted above in HPI  Past medical/social/surgical/family history have been reviewed and are unchanged unless specifically indicated below.  No changes  Medication List: Current Outpatient Medications  Medication Sig Dispense Refill   acetaminophen (TYLENOL) 500 MG tablet Take 1 tablet (500 mg total) by mouth every 6 (six) hours as needed. 30 tablet 0   atorvastatin (LIPITOR) 80 MG tablet TAKE 1 TABLET BY MOUTH ONCE DAILY 90 tablet 3   Budeson-Glycopyrrol-Formoterol (BREZTRI AEROSPHERE) 160-9-4.8 MCG/ACT AERO USE 2 INHALATIONS BY MOUTH  DAILY 32.1 g 2   cholecalciferol (VITAMIN D3) 25 MCG (1000 UNIT) tablet Take 1,000 Units  by mouth daily.     citalopram (CELEXA) 20 MG tablet Take 0.5 tablets (10 mg total) by mouth daily. 30 tablet 2   clopidogrel (PLAVIX) 75 MG tablet TAKE 1 TABLET BY MOUTH DAILY 100 tablet 2   donepezil (ARICEPT) 10 MG tablet TAKE 1 TABLET BY MOUTH AT  BEDTIME 100 tablet 2   ezetimibe (ZETIA) 10 MG tablet Take 1 tablet (10 mg total) by mouth daily. 90 tablet 3   fluorometholone (FML) 0.1 % ophthalmic suspension 1 drop 4 (four) times daily.     gabapentin (NEURONTIN) 100 MG capsule TAKE 1 CAPSULE BY MOUTH 3 TIMES  DAILY AS NEEDED 300 capsule 2   glucose blood (ONETOUCH ULTRA) test strip USE WITH METER TO CHECK BLOOD SUGAR BEFORE BREAKFAST AND BEFORE DINNER 100 each 0   hydrochlorothiazide (HYDRODIURIL) 12.5 MG tablet      meclizine (ANTIVERT) 25 MG tablet TAKE 1 TABLET BY MOUTH 2  TIMES EVERY DAY AS NEEDED 180 tablet 1   olmesartan-hydrochlorothiazide (BENICAR HCT) 40-12.5 MG tablet TAKE 1 TABLET BY MOUTH DAILY 100 tablet 2   Olopatadine HCl 0.6 % SOLN 2 sprays each nostril 2 times daily as needed for runny nose/ itch. 30.5 g 5   pantoprazole (PROTONIX) 40 MG tablet Take 1 tablet (40 mg total) by mouth daily. 30 tablet 3   Probiotic Product (PROBIOTIC-10 PO) Take by mouth. Take  one tablet daily     Semaglutide (RYBELSUS) 7 MG TABS Take 1 tablet by mouth daily. 90 tablet 1   vitamin C (ASCORBIC ACID) 500 MG tablet Take 500 mg by mouth daily.     VITAMIN D PO Take 1 tablet by mouth daily.     azelastine (OPTIVAR) 0.05 % ophthalmic solution Place 1 drop into both eyes 2 (two) times daily. 6 mL 5   cyclobenzaprine (FLEXERIL) 5 MG tablet  (Patient not taking: Reported on 05/21/2022)     diazepam (VALIUM) 5 MG tablet Take 1 tablet (5 mg total) by mouth every 8 (eight) hours as needed for anxiety. (Patient not taking: Reported on 06/06/2022) 20 tablet 0   diclofenac Sodium (VOLTAREN) 1 % GEL Apply topically 4 (four) times daily. (Patient not taking: Reported on 05/21/2022)     levocetirizine (XYZAL) 5 MG  tablet Take 1-2 tablets daily to help with allergy symptoms and itch. 60 tablet 5   Melatonin 5 MG CAPS Take 1 capsule (5 mg total) by mouth daily after supper. (Patient not taking: Reported on 05/21/2022) 30 capsule 0   ondansetron (ZOFRAN) 4 MG tablet Take 1 tablet (4 mg total) by mouth every 8 (eight) hours as needed for nausea or vomiting. (Patient not taking: Reported on 05/21/2022) 15 tablet 0   pimecrolimus (ELIDEL) 1 % cream Apply thin layer to the skin as needed twice a day for itching or rash 30 g 5   Current Facility-Administered Medications  Medication Dose Route Frequency Provider Last Rate Last Admin   0.9 %  sodium chloride infusion  500 mL Intravenous Once Hilarie Fredrickson, MD         Known medication allergies: No Known Allergies   Physical examination: Blood pressure 116/70, pulse (!) 50, temperature 97.8 F (36.6 C), temperature source Temporal, resp. rate 18, SpO2 99 %.  General: Alert, interactive, in no acute distress. HEENT: PERRLA, turbinates minimally edematous with clear discharge, post-pharynx non erythematous. Neck: Supple without lymphadenopathy. Lungs: Clear to auscultation without wheezing, rhonchi or rales. {no increased work of breathing. CV: Normal S1, S2 without murmurs. Abdomen: Nondistended, nontender. Skin: Warm and dry, without lesions or rashes. Extremities:  No clubbing, cyanosis or edema. Neuro:   Grossly intact.  Diagnositics/Labs: None today  Assessment and plan: Allergic rhinitis with conjunctivitis Eyelid dermatitis   - Continue avoidance measures for dust mites and cockroach.  - Air purifiers help to purify the air you breathe in and reduce your allergen load in the home.  - Dymista nasal spray and Atrovent nasal spray have not been effective - try Patanase nasal spray 2 sprays each nostril twice a day as needed for runny/itchy nose.  Discussed after this nasal spray I do not have any more nasal spray options to help with nasal symptoms  and thus may need to refer to ENT. - use Xyzal 1-2 tabs a day to help control allergy symptoms and itch - use Optivar eye drop 1 drop each eye twice a day as needed for itchy/watery eyes - can use small amount of vaseline on qtip to apply to the nose to help decrease dryness - for itching/rash of the eyelid and face use Elidel ointment apply thin layer to the skin as needed twice a day for itching or rash  Follow-up in 4-6 months or sooner if needed  I appreciate the opportunity to take part in Donna Gilmore's care. Please do not hesitate to contact me with questions.  Sincerely,   Margo Aye, MD  Allergy/Immunology [facility]

## 2022-06-07 ENCOUNTER — Other Ambulatory Visit (HOSPITAL_COMMUNITY): Payer: Self-pay

## 2022-06-07 ENCOUNTER — Telehealth: Payer: Self-pay

## 2022-06-07 ENCOUNTER — Other Ambulatory Visit: Payer: Self-pay | Admitting: *Deleted

## 2022-06-07 MED ORDER — LEVOCETIRIZINE DIHYDROCHLORIDE 5 MG PO TABS: 5.0000 mg | ORAL_TABLET | Freq: Every evening | ORAL | 11 refills | Status: DC

## 2022-06-07 NOTE — Telephone Encounter (Signed)
Patient Advocate Encounter   Received notification from [employer] Medicare Part D that prior authorization is required for Levocetirizine Dihydrochloride 5MG  tablets   Submitted: 06-07-2022 Key  [id number]  Status is pending

## 2022-06-07 NOTE — Telephone Encounter (Signed)
Forwarding below notation to provider for next step. 

## 2022-06-07 NOTE — Telephone Encounter (Signed)
New prescription has been sent in to pharmacy.  

## 2022-06-07 NOTE — Telephone Encounter (Signed)
Patient Advocate Encounter  Received a fax from [employer] Medicare Part D regarding Prior Authorization for Levocetirizine Dihydrochloride 5MG  tablets.  Key: [id number]   Authorization has been DENIED due to

## 2022-06-07 NOTE — Telephone Encounter (Signed)
Patient Advocate Encounter  Received a fax from [employer] Medicare Part D regarding Prior Authorization for Olopatadine HCl 0.6% solution.  Key: [id number]   Authorization has been DENIED due to

## 2022-06-07 NOTE — Telephone Encounter (Signed)
Patient Advocate Encounter   Received notification from [employer] Medicare Part D  that prior authorization is required for Olopatadine HCl 0.6% solution   Submitted: 06-07-2022 Key [id number]  Status is pending

## 2022-06-10 NOTE — Telephone Encounter (Signed)
Per insurance after trying to resubmit with new DX code:

## 2022-06-12 ENCOUNTER — Encounter: Payer: Self-pay | Admitting: Nurse Practitioner

## 2022-06-12 ENCOUNTER — Ambulatory Visit (INDEPENDENT_AMBULATORY_CARE_PROVIDER_SITE_OTHER): Payer: Medicare Other | Admitting: Nurse Practitioner

## 2022-06-12 ENCOUNTER — Other Ambulatory Visit: Payer: Self-pay | Admitting: Nurse Practitioner

## 2022-06-12 VITALS — BP 120/70 | HR 85 | Temp 98.4°F | Ht 63.0 in | Wt 185.6 lb

## 2022-06-12 DIAGNOSIS — I129 Hypertensive chronic kidney disease with stage 1 through stage 4 chronic kidney disease, or unspecified chronic kidney disease: Secondary | ICD-10-CM

## 2022-06-12 DIAGNOSIS — I10 Essential (primary) hypertension: Secondary | ICD-10-CM

## 2022-06-12 DIAGNOSIS — E119 Type 2 diabetes mellitus without complications: Secondary | ICD-10-CM | POA: Insufficient documentation

## 2022-06-12 DIAGNOSIS — R232 Flushing: Secondary | ICD-10-CM | POA: Diagnosis not present

## 2022-06-12 DIAGNOSIS — N183 Hypertensive chronic kidney disease with stage 1 through stage 4 chronic kidney disease, or unspecified chronic kidney disease: Secondary | ICD-10-CM

## 2022-06-12 DIAGNOSIS — E1159 Type 2 diabetes mellitus with other circulatory complications: Secondary | ICD-10-CM | POA: Diagnosis not present

## 2022-06-12 DIAGNOSIS — F331 Major depressive disorder, recurrent, moderate: Secondary | ICD-10-CM

## 2022-06-12 DIAGNOSIS — F419 Anxiety disorder, unspecified: Secondary | ICD-10-CM

## 2022-06-12 DIAGNOSIS — E1122 Type 2 diabetes mellitus with diabetic chronic kidney disease: Secondary | ICD-10-CM | POA: Insufficient documentation

## 2022-06-12 MED ORDER — CITALOPRAM HYDROBROMIDE 20 MG PO TABS: 20.0000 mg | ORAL_TABLET | Freq: Every day | ORAL | 1 refills | Status: DC

## 2022-06-12 NOTE — Telephone Encounter (Signed)
Contact Prior Auth Department at the number listed below in previous note.

## 2022-06-12 NOTE — Telephone Encounter (Addendum)
DENIAL letter for Olopatadine SPR 0.6% received.  Appeal form given to provider to complete.  Forwarding message to provider as update.

## 2022-06-12 NOTE — Telephone Encounter (Signed)
KEY: [id number] PA Case ID#: [id number] - [id number]   Called [phone] - spoke to Shai, Advocate- DOB verified - requested refax of DENIAL letter and appeal information for Olopatadine SPR 0.6% - never received.  Burney Gauze stated I should receive the fax within the hour.

## 2022-06-12 NOTE — Patient Instructions (Addendum)
  You will increase your citalopram to 1 whole tab (20 mg) to take daily this may help your itching and feelings of hot flashes.

## 2022-06-12 NOTE — Progress Notes (Signed)
Hershal Coria Martin,acting as a Neurosurgeon for Arnette Felts, FNP.,have documented all relevant documentation on the behalf of Arnette Felts, FNP,as directed by  Arnette Felts, FNP while in the presence of Arnette Felts, FNP.    Subjective:     Patient ID: Donna Gilmore , female    DOB: 31-Jan-1940 , 82 y.o.   MRN: [id number]   Chief Complaint  Patient presents with   Diabetes   Hypertension    HPI  Patient presents for a BP and DM follow up. Patient reports compliance with medications. Patient reports she had some testing done and she wants them to be discussed. Patient reports she is having hot flashes and body itching. She is having dry eyes. She is having itching to her ears, back and head.   She is having reservations about taking Creon. She is to f/u with GI in 4 months. I have advised her to call [facility] to make them aware of her concerns about taking Creon.   BP Readings from Last 3 Encounters: 06/12/22 : 120/70 06/06/22 : 116/70 05/21/22 : 138/78    Hypertension This is a chronic problem. The current episode started more than 1 year ago. The problem is unchanged. Pertinent negatives include no anxiety or headaches. There are no associated agents to hypertension. Risk factors for coronary artery disease include sedentary lifestyle. Past treatments include diuretics and ACE inhibitors. There are no compliance problems.  Hypertensive end-organ damage includes angina. There is no history of chronic renal disease.  Diabetes She presents for her follow-up diabetic visit. She has type 2 diabetes mellitus. Pertinent negatives for hypoglycemia include no dizziness or headaches. There are no diabetic associated symptoms. Pertinent negatives for diabetes include no fatigue. There are no hypoglycemic complications. There are no diabetic complications. Risk factors for coronary artery disease include obesity and sedentary lifestyle. She is compliant with treatment all of the time. She is  following a generally healthy diet. When asked about meal planning, she reported none. She has not had a previous visit with a dietitian. She rarely participates in exercise. (Blood sugars are ranging - 99-136. ) An ACE inhibitor/angiotensin II receptor blocker is being taken. She does not see a podiatrist.Eye exam is not current.  Hyperlipidemia This is a chronic problem. The current episode started more than 1 year ago. The problem is controlled. She has no history of chronic renal disease. There are no compliance problems.  Risk factors for coronary artery disease include a sedentary lifestyle, obesity, hypertension and diabetes mellitus.     Past Medical History:  Diagnosis Date   Benign paroxysmal positional vertigo 08/26/2012   CVA (cerebral infarction) 08/25/2012   Diabetes mellitus without complication (HCC)    Dyslipidemia 08/25/2012   Essential hypertension, benign 08/25/2012   Hyperlipidemia    Hypertension    Meralgia paraesthetica, left 01/12/2018   Stroke [facility])      Family History  Problem Relation Age of Onset   Kidney failure Mother    Diabetes Mother    Alzheimer's disease Mother    Alzheimer's disease Father    Prostate cancer Father    Diabetes Maternal Grandmother    Diabetes Maternal Grandfather    Alzheimer's disease Paternal Grandmother    Alzheimer's disease Paternal Grandfather    Prostate cancer Paternal Uncle      Current Outpatient Medications:    acetaminophen (TYLENOL) 500 MG tablet, Take 1 tablet (500 mg total) by mouth every 6 (six) hours as needed., Disp: 30 tablet, Rfl: 0  azelastine (OPTIVAR) 0.05 % ophthalmic solution, Place 1 drop into both eyes 2 (two) times daily., Disp: 6 mL, Rfl: 5   Budeson-Glycopyrrol-Formoterol (BREZTRI AEROSPHERE) 160-9-4.8 MCG/ACT AERO, USE 2 INHALATIONS BY MOUTH  DAILY, Disp: 32.1 g, Rfl: 2   cholecalciferol (VITAMIN D3) 25 MCG (1000 UNIT) tablet, Take 1,000 Units by mouth daily., Disp: , Rfl:    clopidogrel (PLAVIX)  75 MG tablet, TAKE 1 TABLET BY MOUTH DAILY, Disp: 100 tablet, Rfl: 2   desonide (DESOWEN) 0.05 % ointment, Apply 1 Application topically 2 (two) times daily. for itchy rash, Disp: 60 g, Rfl: 5   donepezil (ARICEPT) 10 MG tablet, TAKE 1 TABLET BY MOUTH AT  BEDTIME, Disp: 100 tablet, Rfl: 2   ezetimibe (ZETIA) 10 MG tablet, Take 1 tablet (10 mg total) by mouth daily., Disp: 90 tablet, Rfl: 3   fluorometholone (FML) 0.1 % ophthalmic suspension, 1 drop 4 (four) times daily., Disp: , Rfl:    gabapentin (NEURONTIN) 100 MG capsule, TAKE 1 CAPSULE BY MOUTH 3 TIMES  DAILY AS NEEDED, Disp: 300 capsule, Rfl: 2   glucose blood (ONETOUCH ULTRA) test strip, USE WITH METER TO CHECK BLOOD SUGAR BEFORE BREAKFAST AND BEFORE DINNER, Disp: 100 each, Rfl: 0   hydrochlorothiazide (HYDRODIURIL) 12.5 MG tablet, , Disp: , Rfl:    levocetirizine (XYZAL) 5 MG tablet, Take 1 tablet (5 mg total) by mouth every evening., Disp: 30 tablet, Rfl: 11   meclizine (ANTIVERT) 25 MG tablet, TAKE 1 TABLET BY MOUTH 2  TIMES EVERY DAY AS NEEDED, Disp: 180 tablet, Rfl: 1   Olopatadine HCl 0.6 % SOLN, 2 sprays each nostril 2 times daily as needed for runny nose/ itch., Disp: 30.5 g, Rfl: 5   pantoprazole (PROTONIX) 40 MG tablet, Take 1 tablet (40 mg total) by mouth daily., Disp: 30 tablet, Rfl: 3   Probiotic Product (PROBIOTIC-10 PO), Take by mouth. Take one tablet daily, Disp: , Rfl:    Semaglutide (RYBELSUS) 7 MG TABS, Take 1 tablet by mouth daily., Disp: 90 tablet, Rfl: 1   vitamin C (ASCORBIC ACID) 500 MG tablet, Take 500 mg by mouth daily., Disp: , Rfl:    VITAMIN D PO, Take 1 tablet by mouth daily., Disp: , Rfl:    atorvastatin (LIPITOR) 80 MG tablet, TAKE 1 TABLET BY MOUTH ONCE  DAILY, Disp: 100 tablet, Rfl: 2   citalopram (CELEXA) 20 MG tablet, Take 1 tablet (20 mg total) by mouth daily., Disp: 90 tablet, Rfl: 1   cyclobenzaprine (FLEXERIL) 5 MG tablet, , Disp: , Rfl:    diazepam (VALIUM) 5 MG tablet, Take 1 tablet (5 mg total) by  mouth every 8 (eight) hours as needed for anxiety. (Patient not taking: Reported on 06/06/2022), Disp: 20 tablet, Rfl: 0   diclofenac Sodium (VOLTAREN) 1 % GEL, Apply topically 4 (four) times daily. (Patient not taking: Reported on 05/21/2022), Disp: , Rfl:    Melatonin 5 MG CAPS, Take 1 capsule (5 mg total) by mouth daily after supper. (Patient not taking: Reported on 05/21/2022), Disp: 30 capsule, Rfl: 0   olmesartan-hydrochlorothiazide (BENICAR HCT) 40-12.5 MG tablet, TAKE 1 TABLET BY MOUTH DAILY, Disp: 100 tablet, Rfl: 2   ondansetron (ZOFRAN) 4 MG tablet, Take 1 tablet (4 mg total) by mouth every 8 (eight) hours as needed for nausea or vomiting. (Patient not taking: Reported on 05/21/2022), Disp: 15 tablet, Rfl: 0  Current Facility-Administered Medications:    0.9 %  sodium chloride infusion, 500 mL, Intravenous, Once, Hilarie Fredrickson, MD  No Known Allergies   Review of Systems  Constitutional: Negative.  Negative for fatigue.  Respiratory: Negative.    Cardiovascular: Negative.   Gastrointestinal: Negative.   Neurological: Negative.  Negative for dizziness and headaches.  Psychiatric/Behavioral: Negative.    All other systems reviewed and are negative.    Today's Vitals   06/12/22 1045  BP: 120/70  Pulse: 85  Temp: 98.4 F (36.9 C)  TempSrc: Oral  Weight: 185 lb 9.6 oz (84.2 kg)  Height: 5\' 3"  (1.6 m)  PainSc: 0-No pain   Body mass index is 32.88 kg/m.  Wt Readings from Last 3 Encounters:  06/12/22 185 lb 9.6 oz (84.2 kg)  05/21/22 186 lb 6 oz (84.5 kg)  04/10/22 188 lb (85.3 kg)    The ASCVD Risk score (Arnett DK, et al., 2019) failed to calculate for the following reasons:   The 2019 ASCVD risk score is only valid for ages 41 to 21   The patient has a prior MI or stroke diagnosis ++ Objective:  Physical Exam Vitals reviewed.  Constitutional:      General: She is not in acute distress.    Appearance: Normal appearance. She is obese.  HENT:     Ears:     Comments:  Hard of hearing Cardiovascular:     Rate and Rhythm: Normal rate and regular rhythm.     Pulses: Normal pulses.     Heart sounds: Normal heart sounds. No murmur heard. Pulmonary:     Effort: Pulmonary effort is normal. No respiratory distress.     Breath sounds: Normal breath sounds. No wheezing.  Skin:    General: Skin is warm and dry.     Capillary Refill: Capillary refill takes less than 2 seconds.  Neurological:     General: No focal deficit present.     Mental Status: She is alert and oriented to person, place, and time.     Cranial Nerves: No cranial nerve deficit.     Motor: No weakness.  Psychiatric:        Mood and Affect: Mood normal.        Behavior: Behavior normal.        Thought Content: Thought content normal.        Judgment: Judgment normal.         Assessment And Plan:     1. Type 2 diabetes mellitus with other circulatory complication, without long-term current use of insulin (HCC) Comments: Chronic, HgbA1c is slightly up since last visit. Will get eye exam from Dr. Dione Booze. - Basic metabolic panel - Hemoglobin A1c  2. Benign hypertension with CKD (chronic kidney disease) stage III (HCC) Comments: Blood pressure is well controlled, continue current medications - Basic metabolic panel  3. Hot flashes Comments: This is looks likely related to menopause as she is 82 years old.  Advised to limit intake of sugary foods  4. Anxiety Comments: No significant changes since her last visit. - citalopram (CELEXA) 20 MG tablet; Take 1 tablet (20 mg total) by mouth daily.  Dispense: 90 tablet; Refill: 1  5. Moderate episode of recurrent major depressive disorder (HCC) Comments: Depression screen score of 21 will increase her citalopram to 20 mg. F/u in 6 weeks. - citalopram (CELEXA) 20 MG tablet; Take 1 tablet (20 mg total) by mouth daily.  Dispense: 90 tablet; Refill: 1    Return in about 4 weeks (around 07/10/2022) for 6 month bp check.  Patient was given  opportunity to ask questions. Patient  verbalized understanding of the plan and was able to repeat key elements of the plan. All questions were answered to their satisfaction.  Arnette Felts, FNP   I, Arnette Felts, FNP, have reviewed all documentation for this visit. The documentation on 06/12/22 for the exam, diagnosis, procedures, and orders are all accurate and complete.   IF YOU HAVE BEEN REFERRED TO A SPECIALIST, IT MAY TAKE 1-2 WEEKS TO SCHEDULE/PROCESS THE REFERRAL. IF YOU HAVE NOT HEARD FROM US/SPECIALIST IN TWO WEEKS, PLEASE GIVE [place] A CALL AT [phone].   THE PATIENT IS ENCOURAGED TO PRACTICE SOCIAL DISTANCING DUE TO THE COVID-19 PANDEMIC.

## 2022-06-13 LAB — BASIC METABOLIC PANEL
BUN/Creatinine Ratio: 8 — ABNORMAL LOW (ref 12–28)
BUN: 11 mg/dL (ref 8–27)
CO2: 21 mmol/L (ref 20–29)
Calcium: 9.7 mg/dL (ref 8.7–10.3)
Chloride: 103 mmol/L (ref 96–106)
Creatinine, Ser: 1.35 mg/dL — ABNORMAL HIGH (ref 0.57–1.00)
Glucose: 107 mg/dL — ABNORMAL HIGH (ref 70–99)
Potassium: 4.1 mmol/L (ref 3.5–5.2)
Sodium: 142 mmol/L (ref 134–144)
eGFR: 39 mL/min/{1.73_m2} — ABNORMAL LOW (ref >59)

## 2022-06-13 LAB — HEMOGLOBIN A1C
Est. average glucose Bld gHb Est-mCnc: 154 mg/dL
Hgb A1c MFr Bld: 7 % — ABNORMAL HIGH (ref 4.8–5.6)

## 2022-06-14 ENCOUNTER — Encounter: Payer: Self-pay | Admitting: Nurse Practitioner

## 2022-07-08 ENCOUNTER — Telehealth: Payer: Self-pay

## 2022-07-08 NOTE — Progress Notes (Cosign Needed Addendum)
Care Management & Coordination Services Pharmacy Team  Reason for Encounter: Appointment Reminder  Contacted patient to confirm telephone appointment with Cherylin Mylar, PharmD on 07-10-2022 at 2:00. Unsuccessful outreach. Left voicemail for patient to return call.   Chart review: Recent office visits:  06-12-2022 Arnette Felts, FNP. Follow up visit for HTN and DM. Increase celexa 10 mg daily to 20 mg daily.  04-10-2022 Barb Merino, LPN. Medicare wellness visit.   Recent consult visits:  06-06-2022 Marcelyn Bruins, MD (Allergy). Follow up visit for allergic rhinitis with conjunctivitis and eyelid dermatitis. Stop dymista. Start xyzal 5 mg 1-2 tabs daily and olopatadine 2 sprays each nostril twice daily.  05-21-2022 Doree Albee, PA-C Laurette Schimke). Visit for abdominal pain. Patient reported not using albuterol. Start pantoprazole 40 mg daily.  04-09-2022 Freddie Breech, DPM (Podiatry). Order placed for diabetic shoes. Toenails were debrided in length and girth 2-5 bilaterally    Hospital visits:  Medication Reconciliation was completed by comparing discharge summary, patient's EMR and Pharmacy list, and upon discussion with patient.  Admitted to the hospital on 05-20-2022 due to Impacted cerumen of right ear. Discharge date was 05-20-2022. Discharged from [facility] health urgent care.  New?Medications Started at [facility] Discharge:?? None  Medication Changes at Hospital Discharge: None  Medications Discontinued at Hospital Discharge: None  Medications that remain the same after Hospital Discharge:??  -All other medications will remain the same.    Hospital visits:  Medication Reconciliation was completed by comparing discharge summary, patient's EMR and Pharmacy list, and upon discussion with patient.  Admitted to the hospital on 04-29-2022 due to abdominal pain. Discharge date was 04-30-2022. Discharged from [facility].  New?Medications  Started at [facility] Discharge:?? Zofran 4 mg every 8 hours PRN  Medication Changes at Hospital Discharge: None  Medications Discontinued at Hospital Discharge: None  Medications that remain the same after Hospital Discharge:??  -All other medications will remain the same.     Star Rating Drugs:  Rybelsus 7 mg- Last filled Last filled 02-13-2022 90 DS [facility]. Previous 06-12-2021 90 DS [facility]  Atorvastatin 80 mg- Last filled 06-18-2022 100 DS [facility]. Previous 04-08-2022 100 DS Benicar 40-12.5 mg- Last filled 06-18-2022 100 DS [facility]. Previous 04-08-2022 100 DS   Care Gaps: Annual wellness visit in last year? Yes Covid booster overdue   If Diabetic: Last eye exam / retinopathy screening: None Last diabetic foot exam:04-09-2022    Huey Romans [facility] Clinical Pharmacist Assistant [phone]

## 2022-07-22 ENCOUNTER — Encounter: Payer: Self-pay | Admitting: Pharmacist

## 2022-07-22 NOTE — Progress Notes (Unsigned)
Contacted patient regarding upcoming appointment with [facility] pharmacist. Per clinical review, no pharmacist appointment needed at this time. Patient denies any medication needs at this time. Appointment canceled.

## 2022-08-06 ENCOUNTER — Encounter: Payer: Self-pay | Admitting: Nurse Practitioner

## 2022-08-06 ENCOUNTER — Ambulatory Visit (INDEPENDENT_AMBULATORY_CARE_PROVIDER_SITE_OTHER): Payer: Medicare Other | Admitting: Nurse Practitioner

## 2022-08-06 VITALS — BP 120/74 | HR 89 | Temp 97.7°F | Ht 63.0 in | Wt 185.0 lb

## 2022-08-06 DIAGNOSIS — E1151 Type 2 diabetes mellitus with diabetic peripheral angiopathy without gangrene: Secondary | ICD-10-CM

## 2022-08-06 DIAGNOSIS — Z7984 Long term (current) use of oral hypoglycemic drugs: Secondary | ICD-10-CM | POA: Diagnosis not present

## 2022-08-06 DIAGNOSIS — E1159 Type 2 diabetes mellitus with other circulatory complications: Secondary | ICD-10-CM | POA: Diagnosis not present

## 2022-08-06 DIAGNOSIS — I739 Peripheral vascular disease, unspecified: Secondary | ICD-10-CM

## 2022-08-06 DIAGNOSIS — F3342 Major depressive disorder, recurrent, in full remission: Secondary | ICD-10-CM

## 2022-08-06 DIAGNOSIS — J449 Chronic obstructive pulmonary disease, unspecified: Secondary | ICD-10-CM

## 2022-08-06 DIAGNOSIS — E1121 Type 2 diabetes mellitus with diabetic nephropathy: Secondary | ICD-10-CM | POA: Diagnosis not present

## 2022-08-06 DIAGNOSIS — J4489 Other specified chronic obstructive pulmonary disease: Secondary | ICD-10-CM

## 2022-08-06 DIAGNOSIS — F419 Anxiety disorder, unspecified: Secondary | ICD-10-CM

## 2022-08-06 DIAGNOSIS — I129 Hypertensive chronic kidney disease with stage 1 through stage 4 chronic kidney disease, or unspecified chronic kidney disease: Secondary | ICD-10-CM

## 2022-08-06 DIAGNOSIS — F331 Major depressive disorder, recurrent, moderate: Secondary | ICD-10-CM

## 2022-08-06 DIAGNOSIS — N183 Chronic kidney disease, stage 3 unspecified: Secondary | ICD-10-CM

## 2022-08-06 DIAGNOSIS — E6609 Other obesity due to excess calories: Secondary | ICD-10-CM

## 2022-08-06 DIAGNOSIS — Z6832 Body mass index (BMI) 32.0-32.9, adult: Secondary | ICD-10-CM

## 2022-08-06 NOTE — Progress Notes (Signed)
Madelaine Bhat, CMA,acting as a Neurosurgeon for Arnette Felts, FNP.,have documented all relevant documentation on the behalf of Arnette Felts, FNP,as directed by  Arnette Felts, FNP while in the presence of Arnette Felts, FNP.  Subjective:  Patient ID: Donna Gilmore , female    DOB: 1940/07/20 , 82 y.o.   MRN: [id number]  Chief Complaint  Patient presents with   Hypertension    HPI  Patient presents today for an bp and DM follow up, patient reports compliance with  medications and has no other concerns today. Patient denies any chest pain, SOB, and headaches.   BP Readings from Last 3 Encounters: 08/06/22 : 120/74 06/12/22 : 120/70 06/06/22 : 116/70   Wt Readings from Last 3 Encounters: 08/06/22 : 185 lb (83.9 kg) 06/12/22 : 185 lb 9.6 oz (84.2 kg) 05/21/22 : 186 lb 6 oz (84.5 kg)  She was given a medication from GI but caused her diarrhea. She is taking a supplement her granddaughter recommended.   Hypertension This is a chronic problem. The current episode started more than 1 year ago. The problem is unchanged. Pertinent negatives include no anxiety or headaches. There are no associated agents to hypertension. Risk factors for coronary artery disease include sedentary lifestyle. Past treatments include diuretics and ACE inhibitors. There are no compliance problems.  Hypertensive end-organ damage includes angina, kidney disease and PVD. There is no history of chronic renal disease.  Diabetes She presents for her follow-up diabetic visit. She has type 2 diabetes mellitus. Pertinent negatives for hypoglycemia include no dizziness or headaches. There are no diabetic associated symptoms. Pertinent negatives for diabetes include no fatigue. There are no hypoglycemic complications. Diabetic complications include nephropathy and PVD. Risk factors for coronary artery disease include obesity, sedentary lifestyle, diabetes mellitus, dyslipidemia and hypertension. Current diabetic treatment includes  oral agent (monotherapy). She is compliant with treatment all of the time. She is following a generally healthy diet. When asked about meal planning, she reported none. She has not had a previous visit with a dietitian. She rarely participates in exercise. (Blood sugars are ranging - 70 -138) An ACE inhibitor/angiotensin II receptor blocker is being taken. She does not see a podiatrist.Eye exam is not current.  Hyperlipidemia This is a chronic problem. The current episode started more than 1 year ago. The problem is controlled. She has no history of chronic renal disease. Current antihyperlipidemic treatment includes diet change and statins (she had been walking with her granddaughter at the gym for the last 3 weeks.). There are no compliance problems.  Risk factors for coronary artery disease include a sedentary lifestyle, obesity, hypertension and diabetes mellitus.     Past Medical History:  Diagnosis Date   Acute left-sided low back pain with left-sided sciatica 05/04/2018   Benign paroxysmal positional vertigo 08/26/2012   Chest pain 09/21/2012   CVA (cerebral infarction) 08/25/2012   Diabetes mellitus without complication (HCC)    Dyslipidemia 08/25/2012   Essential hypertension, benign 08/25/2012   Hyperlipidemia    Hypertension    Meralgia paraesthetica, left 01/12/2018   Stroke [facility])      Family History  Problem Relation Age of Onset   Kidney failure Mother    Diabetes Mother    Alzheimer's disease Mother    Alzheimer's disease Father    Prostate cancer Father    Diabetes Maternal Grandmother    Diabetes Maternal Grandfather    Alzheimer's disease Paternal Grandmother    Alzheimer's disease Paternal Grandfather    Prostate cancer Paternal  Uncle      Current Outpatient Medications:    acetaminophen (TYLENOL) 500 MG tablet, Take 1 tablet (500 mg total) by mouth every 6 (six) hours as needed., Disp: 30 tablet, Rfl: 0   atorvastatin (LIPITOR) 80 MG tablet, TAKE 1 TABLET BY  MOUTH ONCE  DAILY, Disp: 100 tablet, Rfl: 2   azelastine (OPTIVAR) 0.05 % ophthalmic solution, Place 1 drop into both eyes 2 (two) times daily., Disp: 6 mL, Rfl: 5   Budeson-Glycopyrrol-Formoterol (BREZTRI AEROSPHERE) 160-9-4.8 MCG/ACT AERO, USE 2 INHALATIONS BY MOUTH  DAILY, Disp: 32.1 g, Rfl: 2   cholecalciferol (VITAMIN D3) 25 MCG (1000 UNIT) tablet, Take 1,000 Units by mouth daily., Disp: , Rfl:    citalopram (CELEXA) 20 MG tablet, Take 1 tablet (20 mg total) by mouth daily., Disp: 90 tablet, Rfl: 1   desonide (DESOWEN) 0.05 % ointment, Apply 1 Application topically 2 (two) times daily. for itchy rash, Disp: 60 g, Rfl: 5   diclofenac Sodium (VOLTAREN) 1 % GEL, Apply topically 4 (four) times daily., Disp: , Rfl:    donepezil (ARICEPT) 10 MG tablet, TAKE 1 TABLET BY MOUTH AT  BEDTIME, Disp: 100 tablet, Rfl: 2   ezetimibe (ZETIA) 10 MG tablet, Take 1 tablet (10 mg total) by mouth daily., Disp: 90 tablet, Rfl: 3   fluorometholone (FML) 0.1 % ophthalmic suspension, 1 drop 4 (four) times daily., Disp: , Rfl:    gabapentin (NEURONTIN) 100 MG capsule, TAKE 1 CAPSULE BY MOUTH 3 TIMES  DAILY AS NEEDED, Disp: 300 capsule, Rfl: 2   glucose blood (ONETOUCH ULTRA) test strip, USE WITH METER TO CHECK BLOOD SUGAR BEFORE BREAKFAST AND BEFORE DINNER, Disp: 100 each, Rfl: 0   hydrochlorothiazide (HYDRODIURIL) 12.5 MG tablet, , Disp: , Rfl:    levocetirizine (XYZAL) 5 MG tablet, Take 1 tablet (5 mg total) by mouth every evening., Disp: 30 tablet, Rfl: 11   meclizine (ANTIVERT) 25 MG tablet, TAKE 1 TABLET BY MOUTH 2  TIMES EVERY DAY AS NEEDED, Disp: 180 tablet, Rfl: 1   olmesartan-hydrochlorothiazide (BENICAR HCT) 40-12.5 MG tablet, TAKE 1 TABLET BY MOUTH DAILY, Disp: 100 tablet, Rfl: 2   Olopatadine HCl 0.6 % SOLN, 2 sprays each nostril 2 times daily as needed for runny nose/ itch., Disp: 30.5 g, Rfl: 5   pantoprazole (PROTONIX) 40 MG tablet, Take 1 tablet (40 mg total) by mouth daily., Disp: 30 tablet, Rfl: 3    Probiotic Product (PROBIOTIC-10 PO), Take by mouth. Take one tablet daily, Disp: , Rfl:    Semaglutide (RYBELSUS) 7 MG TABS, Take 1 tablet by mouth daily., Disp: 90 tablet, Rfl: 1   vitamin C (ASCORBIC ACID) 500 MG tablet, Take 500 mg by mouth daily., Disp: , Rfl:    VITAMIN D PO, Take 1 tablet by mouth daily., Disp: , Rfl:    clopidogrel (PLAVIX) 75 MG tablet, TAKE 1 TABLET BY MOUTH DAILY, Disp: 100 tablet, Rfl: 2  Current Facility-Administered Medications:    0.9 %  sodium chloride infusion, 500 mL, Intravenous, Once, Hilarie Fredrickson, MD   No Known Allergies   Review of Systems  Constitutional: Negative.  Negative for fatigue.  Respiratory: Negative.    Cardiovascular: Negative.   Gastrointestinal: Negative.   Neurological: Negative.  Negative for dizziness and headaches.  Psychiatric/Behavioral: Negative.    All other systems reviewed and are negative.    Today's Vitals   08/06/22 1154  BP: 120/74  Pulse: 89  Temp: 97.7 F (36.5 C)  TempSrc: Oral  Weight: 185  lb (83.9 kg)  Height: 5\' 3"  (1.6 m)  PainSc: 0-No pain   Body mass index is 32.77 kg/m.  Wt Readings from Last 3 Encounters:  08/06/22 185 lb (83.9 kg)  06/12/22 185 lb 9.6 oz (84.2 kg)  05/21/22 186 lb 6 oz (84.5 kg)     Objective:  Physical Exam Vitals reviewed.  Constitutional:      General: She is not in acute distress.    Appearance: Normal appearance. She is obese.  HENT:     Ears:     Comments: Hard of hearing Cardiovascular:     Rate and Rhythm: Normal rate and regular rhythm.     Pulses: Normal pulses.     Heart sounds: Normal heart sounds. No murmur heard. Pulmonary:     Effort: Pulmonary effort is normal. No respiratory distress.     Breath sounds: Normal breath sounds. No wheezing.  Musculoskeletal:        General: Normal range of motion.  Skin:    General: Skin is warm and dry.     Capillary Refill: Capillary refill takes less than 2 seconds.  Neurological:     General: No focal deficit  present.     Mental Status: She is alert and oriented to person, place, and time.     Cranial Nerves: No cranial nerve deficit.     Motor: No weakness.  Psychiatric:        Mood and Affect: Mood normal.        Behavior: Behavior normal.        Thought Content: Thought content normal.        Judgment: Judgment normal.         Assessment And Plan:  Type 2 diabetes mellitus with other circulatory complication, without long-term current use of insulin (HCC) Assessment & Plan: Continue current medications, HgbA1c is slightly improved.     Benign hypertension with CKD (chronic kidney disease) stage III (HCC) Assessment & Plan: Blood pressure is well controlled, continue current medications    Anxiety Assessment & Plan: Currently she is doing well.    Class 1 obesity due to excess calories without serious comorbidity with body mass index (BMI) of 32.0 to 32.9 in adult Assessment & Plan: She is encouraged to strive for BMI less than 30 to decrease cardiac risk. Advised to aim for at least 150 minutes of exercise per week.    Recurrent major depressive disorder, in full remission [facility]) Assessment & Plan: Most recent depression score is normal of 0    Peripheral vascular disease (HCC) Assessment & Plan: Continue statin, doing well.    COPD with asthma Assessment & Plan: Continue inhalers as directed, no recent exacerbations     Return for controlled DM check 4 months.  Patient was given opportunity to ask questions. Patient verbalized understanding of the plan and was able to repeat key elements of the plan. All questions were answered to their satisfaction.    Jeanell Sparrow, FNP, have reviewed all documentation for this visit. The documentation on 08/06/22 for the exam, diagnosis, procedures, and orders are all accurate and complete.   IF YOU HAVE BEEN REFERRED TO A SPECIALIST, IT MAY TAKE 1-2 WEEKS TO SCHEDULE/PROCESS THE REFERRAL. IF YOU HAVE NOT HEARD FROM  US/SPECIALIST IN TWO WEEKS, PLEASE GIVE [place] A CALL AT [phone].

## 2022-08-09 ENCOUNTER — Other Ambulatory Visit: Payer: Self-pay | Admitting: Nurse Practitioner

## 2022-08-13 ENCOUNTER — Ambulatory Visit: Payer: Medicare Other | Admitting: Podiatry

## 2022-08-24 DIAGNOSIS — N1831 Chronic kidney disease, stage 3a: Secondary | ICD-10-CM | POA: Insufficient documentation

## 2022-08-24 DIAGNOSIS — I739 Peripheral vascular disease, unspecified: Secondary | ICD-10-CM | POA: Insufficient documentation

## 2022-08-24 DIAGNOSIS — I129 Hypertensive chronic kidney disease with stage 1 through stage 4 chronic kidney disease, or unspecified chronic kidney disease: Secondary | ICD-10-CM | POA: Insufficient documentation

## 2022-08-24 NOTE — Assessment & Plan Note (Signed)
Continue inhalers as directed, no recent exacerbations

## 2022-08-24 NOTE — Assessment & Plan Note (Signed)
She is encouraged to strive for BMI less than 30 to decrease cardiac risk. Advised to aim for at least 150 minutes of exercise per week.  

## 2022-08-24 NOTE — Assessment & Plan Note (Signed)
Continue statin, doing well.

## 2022-08-24 NOTE — Assessment & Plan Note (Signed)
Most recent depression score is normal of 0

## 2022-08-24 NOTE — Assessment & Plan Note (Signed)
Blood pressure is well controlled, continue current medications.

## 2022-08-24 NOTE — Assessment & Plan Note (Signed)
Currently she is doing well.

## 2022-08-24 NOTE — Assessment & Plan Note (Signed)
Continue current medications, HgbA1c is slightly improved.

## 2022-09-14 ENCOUNTER — Other Ambulatory Visit: Payer: Self-pay | Admitting: Nurse Practitioner

## 2022-09-25 ENCOUNTER — Ambulatory Visit (INDEPENDENT_AMBULATORY_CARE_PROVIDER_SITE_OTHER): Payer: Medicare Other | Admitting: Family Medicine

## 2022-09-25 ENCOUNTER — Encounter: Payer: Self-pay | Admitting: Family Medicine

## 2022-09-25 VITALS — BP 130/60 | HR 63 | Temp 98.1°F | Ht 63.0 in | Wt 185.0 lb

## 2022-09-25 DIAGNOSIS — R12 Heartburn: Secondary | ICD-10-CM | POA: Diagnosis not present

## 2022-09-25 DIAGNOSIS — R519 Headache, unspecified: Secondary | ICD-10-CM | POA: Diagnosis not present

## 2022-09-25 DIAGNOSIS — N183 Chronic kidney disease, stage 3 unspecified: Secondary | ICD-10-CM | POA: Diagnosis not present

## 2022-09-25 DIAGNOSIS — R82998 Other abnormal findings in urine: Secondary | ICD-10-CM | POA: Diagnosis not present

## 2022-09-25 DIAGNOSIS — I129 Hypertensive chronic kidney disease with stage 1 through stage 4 chronic kidney disease, or unspecified chronic kidney disease: Secondary | ICD-10-CM | POA: Diagnosis not present

## 2022-09-25 DIAGNOSIS — R410 Disorientation, unspecified: Secondary | ICD-10-CM

## 2022-09-25 MED ORDER — PANTOPRAZOLE SODIUM 40 MG PO TBEC
40.0000 mg | DELAYED_RELEASE_TABLET | Freq: Every day | ORAL | 6 refills | Status: DC
Start: 2022-09-25 — End: 2023-05-12

## 2022-09-25 NOTE — Progress Notes (Signed)
I,[name], CMA,acting as a Neurosurgeon for [employer], NP.,have documented all relevant documentation on the behalf of Ellender Hose, NP,as directed by  Ellender Hose, NP while in the presence of Ellender Hose, NP.  Subjective:  Patient ID: Donna Gilmore , female    DOB: March 13, 1940 , 82 y.o.   MRN: [id number]  Chief Complaint  Patient presents with   Fatigue    HPI  Patient presents today for fatigue she also reports feeling like her head is going to fall apart in the mornings. Her symptoms have been going on for a while but recently it has gotten worse. She was told it was allergies but she takes allergy meds but nothing is getting better. She also complains of constipation. She has tried many things otc. She reports she has been to GI doctor and she was told she had crohns disease.  Son could not collaborate most of what patient was saying, he reports that his mom seem to be experiencing some  increased confusion, will get CT head and also check for Urinary Tract Infection.      Past Medical History:  Diagnosis Date   Acute left-sided low back pain with left-sided sciatica 05/04/2018   Benign paroxysmal positional vertigo 08/26/2012   Chest pain 09/21/2012   CVA (cerebral infarction) 08/25/2012   Diabetes mellitus without complication (HCC)    Dyslipidemia 08/25/2012   Essential hypertension, benign 08/25/2012   Hyperlipidemia    Hypertension    Meralgia paraesthetica, left 01/12/2018   Stroke (HCC)      Family History  Problem Relation Age of Onset   Kidney failure Mother    Diabetes Mother    Alzheimer's disease Mother    Alzheimer's disease Father    Prostate cancer Father    Diabetes Maternal Grandmother    Diabetes Maternal Grandfather    Alzheimer's disease Paternal Grandmother    Alzheimer's disease Paternal Grandfather    Prostate cancer Paternal Uncle      Current Outpatient Medications:    acetaminophen (TYLENOL) 500 MG tablet, Take 1 tablet (500 mg  total) by mouth every 6 (six) hours as needed., Disp: 30 tablet, Rfl: 0   atorvastatin (LIPITOR) 80 MG tablet, TAKE 1 TABLET BY MOUTH ONCE  DAILY, Disp: 100 tablet, Rfl: 2   azelastine (OPTIVAR) 0.05 % ophthalmic solution, Place 1 drop into both eyes 2 (two) times daily., Disp: 6 mL, Rfl: 5   Budeson-Glycopyrrol-Formoterol (BREZTRI AEROSPHERE) 160-9-4.8 MCG/ACT AERO, USE 2 INHALATIONS BY MOUTH  DAILY, Disp: 32.1 g, Rfl: 2   cholecalciferol (VITAMIN D3) 25 MCG (1000 UNIT) tablet, Take 1,000 Units by mouth daily., Disp: , Rfl:    citalopram (CELEXA) 20 MG tablet, Take 1 tablet (20 mg total) by mouth daily., Disp: 90 tablet, Rfl: 1   clopidogrel (PLAVIX) 75 MG tablet, TAKE 1 TABLET BY MOUTH DAILY, Disp: 100 tablet, Rfl: 2   desonide (DESOWEN) 0.05 % ointment, Apply 1 Application topically 2 (two) times daily. for itchy rash, Disp: 60 g, Rfl: 5   diclofenac Sodium (VOLTAREN) 1 % GEL, Apply topically 4 (four) times daily., Disp: , Rfl:    donepezil (ARICEPT) 10 MG tablet, TAKE 1 TABLET BY MOUTH AT  BEDTIME, Disp: 100 tablet, Rfl: 2   fluorometholone (FML) 0.1 % ophthalmic suspension, 1 drop 4 (four) times daily., Disp: , Rfl:    gabapentin (NEURONTIN) 100 MG capsule, TAKE 1 CAPSULE BY MOUTH 3 TIMES  DAILY AS NEEDED, Disp: 300 capsule, Rfl: 2   hydrochlorothiazide (HYDRODIURIL)  12.5 MG tablet, , Disp: , Rfl:    levocetirizine (XYZAL) 5 MG tablet, Take 1 tablet (5 mg total) by mouth every evening., Disp: 30 tablet, Rfl: 11   meclizine (ANTIVERT) 25 MG tablet, TAKE 1 TABLET BY MOUTH 2  TIMES EVERY DAY AS NEEDED, Disp: 180 tablet, Rfl: 1   olmesartan-hydrochlorothiazide (BENICAR HCT) 40-12.5 MG tablet, TAKE 1 TABLET BY MOUTH DAILY, Disp: 100 tablet, Rfl: 2   Olopatadine HCl 0.6 % SOLN, 2 sprays each nostril 2 times daily as needed for runny nose/ itch., Disp: 30.5 g, Rfl: 5   ONETOUCH ULTRA test strip, USE WITH METER TO CHECK BLOOD  SUGAR BEFORE BREAKFAST AND  BEFORE DINNER, Disp: 200 strip, Rfl: 2    pantoprazole (PROTONIX) 40 MG tablet, Take 1 tablet (40 mg total) by mouth daily., Disp: 30 tablet, Rfl: 3   pantoprazole (PROTONIX) 40 MG tablet, Take 1 tablet (40 mg total) by mouth daily., Disp: 30 tablet, Rfl: 6   Probiotic Product (PROBIOTIC-10 PO), Take by mouth. Take one tablet daily, Disp: , Rfl:    Semaglutide (RYBELSUS) 7 MG TABS, Take 1 tablet by mouth daily., Disp: 90 tablet, Rfl: 1   vitamin C (ASCORBIC ACID) 500 MG tablet, Take 500 mg by mouth daily., Disp: , Rfl:    VITAMIN D PO, Take 1 tablet by mouth daily., Disp: , Rfl:    ezetimibe (ZETIA) 10 MG tablet, Take 1 tablet (10 mg total) by mouth daily., Disp: 90 tablet, Rfl: 3   polyethylene glycol (MIRALAX / GLYCOLAX) 17 g packet, Take 17 g by mouth 2 (two) times daily., Disp: 60 packet, Rfl: 3  Current Facility-Administered Medications:    0.9 %  sodium chloride infusion, 500 mL, Intravenous, Once, Hilarie Fredrickson, MD   No Known Allergies   Review of Systems  Constitutional: Negative.   HENT:  Positive for hearing loss.   Eyes: Negative.   Respiratory: Negative.    Gastrointestinal: Negative.   Genitourinary: Negative.   Skin: Negative.   Neurological:  Positive for headaches.  Hematological: Negative.   Psychiatric/Behavioral:  Positive for confusion. The patient is not nervous/anxious.        Mixing information together     Today's Vitals   09/25/22 1606  BP: 130/60  Pulse: 63  Temp: 98.1 F (36.7 C)  Weight: 185 lb (83.9 kg)  Height: 5\' 3"  (1.6 m)  PainSc: 2   PainLoc: Head   Body mass index is 32.77 kg/m.  Wt Readings from Last 3 Encounters:  09/25/22 185 lb (83.9 kg)  08/06/22 185 lb (83.9 kg)  06/12/22 185 lb 9.6 oz (84.2 kg)      Objective:  Physical Exam HENT:     Head: Normocephalic.  Cardiovascular:     Rate and Rhythm: Normal rate.  Pulmonary:     Effort: Pulmonary effort is normal.  Skin:    General: Skin is warm.  Neurological:     Mental Status: She is alert.          Assessment And Plan:  Generalized headaches -     CT HEAD WO CONTRAST ( ); Future  Heartburn -     Pantoprazole Sodium; Take 1 tablet (40 mg total) by mouth daily.  Dispense: 30 tablet; Refill: 6  Confusion and disorientation Assessment & Plan: Had information the son could not confirm, patient confirmed that she feels a little bit more confused lately  Orders: -     POCT urinalysis dipstick  Leukocytes in urine -  Urine Culture  Benign hypertension with CKD (chronic kidney disease) stage III (HCC) Assessment & Plan: Chronic.Encouraged to keep BP well controlled and avoid use of NSAIDs      Return for Keep scheduled appt.  Patient was given opportunity to ask questions. Patient verbalized understanding of the plan and was able to repeat key elements of the plan. All questions were answered to their satisfaction.    I, Ellender Hose, NP, have reviewed all documentation for this visit. The documentation on 10/04/22 for the exam, diagnosis, procedures, and orders are all accurate and complete.   IF YOU HAVE BEEN REFERRED TO A SPECIALIST, IT MAY TAKE 1-2 WEEKS TO SCHEDULE/PROCESS THE REFERRAL. IF YOU HAVE NOT HEARD FROM US/SPECIALIST IN TWO WEEKS, PLEASE GIVE [place] A CALL AT [phone].

## 2022-09-26 DIAGNOSIS — R82998 Other abnormal findings in urine: Secondary | ICD-10-CM | POA: Diagnosis not present

## 2022-09-27 ENCOUNTER — Telehealth: Payer: Self-pay | Admitting: Physician Assistant

## 2022-09-27 DIAGNOSIS — R1013 Epigastric pain: Secondary | ICD-10-CM

## 2022-09-27 DIAGNOSIS — R198 Other specified symptoms and signs involving the digestive system and abdomen: Secondary | ICD-10-CM

## 2022-09-27 LAB — URINE CULTURE

## 2022-09-27 NOTE — Telephone Encounter (Signed)
Patients son called requesting to speak with a nurse regarding the patients last visit. Has questions on date of service and medication.

## 2022-09-28 ENCOUNTER — Encounter: Payer: Self-pay | Admitting: Nurse Practitioner

## 2022-10-01 ENCOUNTER — Ambulatory Visit
Admission: RE | Admit: 2022-10-01 | Discharge: 2022-10-01 | Disposition: A | Discharge status: Home / Self Care | Payer: Medicare Other | Source: Ambulatory Visit | Attending: Family Medicine | Admitting: Family Medicine

## 2022-10-01 DIAGNOSIS — R410 Disorientation, unspecified: Secondary | ICD-10-CM | POA: Diagnosis not present

## 2022-10-01 DIAGNOSIS — R519 Headache, unspecified: Secondary | ICD-10-CM | POA: Diagnosis not present

## 2022-10-01 NOTE — Telephone Encounter (Signed)
Donna Gilmore you saw this lady in April. Her son Donna Gilmore is calling and concerned about Mom's constipation issues. She is using over the counter Traditional Medicinal Organic Smooth Move to relieve her constipation. Her last bowel movement was yesterday. Drinks a cup every day and some days does 2 tea bags. She aims to drink 64 oz's of water a day. Has no vomiting or fever. She is complaining of abdominal pain and told her son some days she has some diarrhea. No blood in stool. Her son Donna Gilmore is seeking advise and is willing to bring Mom in for another visit if that is advised. He request that we call the land line back with advise so Mom can hear what is said. The # is [phone].

## 2022-10-01 NOTE — Assessment & Plan Note (Signed)
Had information the son could not confirm, patient confirmed that she feels a little bit more confused lately

## 2022-10-02 LAB — POCT URINALYSIS DIPSTICK
Bilirubin, UA: NEGATIVE
Blood, UA: NEGATIVE
Glucose, UA: NEGATIVE
Ketones, UA: NEGATIVE
Nitrite, UA: NEGATIVE
Protein, UA: NEGATIVE
Spec Grav, UA: 1.01 (ref 1.010–1.025)
Urobilinogen, UA: 0.2 U/dL
pH, UA: 6 (ref 5.0–8.0)

## 2022-10-02 MED ORDER — POLYETHYLENE GLYCOL 3350 17 G PO PACK: 17.0000 g | PACK | Freq: Two times a day (BID) | ORAL | 3 refills | Status: DC

## 2022-10-02 NOTE — Telephone Encounter (Signed)
Can have 2-3 days without having a bowel movement, when she does have one it is liquids. She is having a lot of upper AB pain. Denies nausea or vomiting. Denies GERD but has been having trouble swallowing.  Denies blood in stools, no black stools.  Uncertain if the protonix helped her symptoms but she ran out. Has been drinking senna tea at night that sometimes helps.  She had normal CT 04/2022 and had normal UGI.   Talked with the son, sent mychart message with Xray information and this information below:  Go to the ER if unable to pass gas, severe AB pain, unable to hold down food, any shortness of breath of chest pain.   Miralax is an osmotic laxative.  It only brings more water into the stool.  This is safe to take daily.  Can take up to 17 gram of miralax twice a day.  Mix with juice or coffee.  Start 1 capful in the morning for 3-4 days and reassess your response in 3-4 days.  You can increase and decrease the dose based on your response.  Remember, it can take up to 3-4 days to take effect OR for the effects to wear off.   I often pair this with benefiber in the morning to help assure the stool is not too loose.   Take this miralax in the morning and the smooth move tea at night.  I can try to send in a prescription to see if this is covered by her insurance.   For the AB pain, we will see if helping her move her bowels will be helpful but she can do the trial of the protonix again, this can cause some loose stools so I'm going to send that in for you to consider taking. See if you have some at home, if not I can send that in for you.

## 2022-10-04 NOTE — Assessment & Plan Note (Signed)
Chronic.Encouraged to keep BP well controlled and avoid use of NSAIDs

## 2022-10-08 ENCOUNTER — Ambulatory Visit: Payer: Medicare Other | Admitting: Nurse Practitioner

## 2022-10-09 DIAGNOSIS — Z961 Presence of intraocular lens: Secondary | ICD-10-CM | POA: Diagnosis not present

## 2022-10-09 DIAGNOSIS — H0102B Squamous blepharitis left eye, upper and lower eyelids: Secondary | ICD-10-CM | POA: Diagnosis not present

## 2022-10-09 DIAGNOSIS — H47322 Drusen of optic disc, left eye: Secondary | ICD-10-CM | POA: Diagnosis not present

## 2022-10-09 DIAGNOSIS — E119 Type 2 diabetes mellitus without complications: Secondary | ICD-10-CM | POA: Diagnosis not present

## 2022-10-09 DIAGNOSIS — H35373 Puckering of macula, bilateral: Secondary | ICD-10-CM | POA: Diagnosis not present

## 2022-10-09 DIAGNOSIS — H0102A Squamous blepharitis right eye, upper and lower eyelids: Secondary | ICD-10-CM | POA: Diagnosis not present

## 2022-10-09 DIAGNOSIS — H25811 Combined forms of age-related cataract, right eye: Secondary | ICD-10-CM | POA: Diagnosis not present

## 2022-10-09 DIAGNOSIS — H0288A Meibomian gland dysfunction right eye, upper and lower eyelids: Secondary | ICD-10-CM | POA: Diagnosis not present

## 2022-10-09 DIAGNOSIS — H10413 Chronic giant papillary conjunctivitis, bilateral: Secondary | ICD-10-CM | POA: Diagnosis not present

## 2022-10-09 DIAGNOSIS — H0288B Meibomian gland dysfunction left eye, upper and lower eyelids: Secondary | ICD-10-CM | POA: Diagnosis not present

## 2022-10-09 DIAGNOSIS — H2511 Age-related nuclear cataract, right eye: Secondary | ICD-10-CM | POA: Diagnosis not present

## 2022-10-09 LAB — HM DIABETES EYE EXAM

## 2022-10-14 ENCOUNTER — Other Ambulatory Visit: Payer: Self-pay | Admitting: Nurse Practitioner

## 2022-10-14 ENCOUNTER — Ambulatory Visit: Payer: Medicare Other | Admitting: Nurse Practitioner

## 2022-10-14 ENCOUNTER — Encounter: Payer: Self-pay | Admitting: Nurse Practitioner

## 2022-10-14 VITALS — BP 116/72 | HR 85 | Temp 97.8°F | Ht 63.0 in | Wt 198.6 lb

## 2022-10-14 DIAGNOSIS — I739 Peripheral vascular disease, unspecified: Secondary | ICD-10-CM | POA: Diagnosis not present

## 2022-10-14 DIAGNOSIS — Z79899 Other long term (current) drug therapy: Secondary | ICD-10-CM | POA: Diagnosis not present

## 2022-10-14 DIAGNOSIS — J4489 Other specified chronic obstructive pulmonary disease: Secondary | ICD-10-CM

## 2022-10-14 DIAGNOSIS — I129 Hypertensive chronic kidney disease with stage 1 through stage 4 chronic kidney disease, or unspecified chronic kidney disease: Secondary | ICD-10-CM

## 2022-10-14 DIAGNOSIS — R5383 Other fatigue: Secondary | ICD-10-CM | POA: Diagnosis not present

## 2022-10-14 DIAGNOSIS — Z23 Encounter for immunization: Secondary | ICD-10-CM | POA: Diagnosis not present

## 2022-10-14 DIAGNOSIS — N1831 Chronic kidney disease, stage 3a: Secondary | ICD-10-CM

## 2022-10-14 DIAGNOSIS — R232 Flushing: Secondary | ICD-10-CM

## 2022-10-14 DIAGNOSIS — E1159 Type 2 diabetes mellitus with other circulatory complications: Secondary | ICD-10-CM | POA: Diagnosis not present

## 2022-10-14 DIAGNOSIS — Z0000 Encounter for general adult medical examination without abnormal findings: Secondary | ICD-10-CM | POA: Diagnosis not present

## 2022-10-14 DIAGNOSIS — E1151 Type 2 diabetes mellitus with diabetic peripheral angiopathy without gangrene: Secondary | ICD-10-CM

## 2022-10-14 DIAGNOSIS — E1122 Type 2 diabetes mellitus with diabetic chronic kidney disease: Secondary | ICD-10-CM | POA: Diagnosis not present

## 2022-10-14 DIAGNOSIS — Z6835 Body mass index (BMI) 35.0-35.9, adult: Secondary | ICD-10-CM

## 2022-10-14 DIAGNOSIS — E6609 Other obesity due to excess calories: Secondary | ICD-10-CM | POA: Diagnosis not present

## 2022-10-14 DIAGNOSIS — N183 Chronic kidney disease, stage 3 unspecified: Secondary | ICD-10-CM | POA: Diagnosis not present

## 2022-10-14 NOTE — Progress Notes (Signed)
Madelaine Bhat, CMA,acting as a Neurosurgeon for Arnette Felts, FNP.,have documented all relevant documentation on the behalf of Arnette Felts, FNP,as directed by  Arnette Felts, FNP while in the presence of Arnette Felts, FNP.  Subjective:    Patient ID: Donna Gilmore , female    DOB: [birth year] , 82 y.o.   MRN: [id number]  Chief Complaint  Patient presents with   Annual Exam    HPI  Patient presents today for HM, patient reports compliance with medications. Patient denies any chest pain, SOB, or headaches. Patient has no concerns today. Patient previously had EKG. She tries not to eat her sweets after 6pm.   BP Readings from Last 3 Encounters: 10/14/22 : 116/72 09/25/22 : 130/60 08/06/22 : 120/74   Wt Readings from Last 3 Encounters: 10/14/22 : 198 lb 9.6 oz (90.1 kg) 09/25/22 : 185 lb (83.9 kg) 08/06/22 : 185 lb (83.9 kg)    Headache  This is a chronic problem. The current episode started more than 1 month ago. The problem occurs constantly (reports almost daily upon awakening). The problem has been gradually worsening. The pain is located in the Bilateral region. The pain does not radiate. The pain quality is not similar to prior headaches. The quality of the pain is described as aching. Pertinent negatives include no abdominal pain or dizziness.     Past Medical History:  Diagnosis Date   Acute left-sided low back pain with left-sided sciatica 05/04/2018   Benign paroxysmal positional vertigo 08/26/2012   Chest pain 09/21/2012   CVA (cerebral infarction) 08/25/2012   Diabetes mellitus without complication (HCC)    Dyslipidemia 08/25/2012   Essential hypertension, benign 08/25/2012   Hyperlipidemia    Hypertension    Meralgia paraesthetica, left 01/12/2018   Stroke (HCC)      Family History  Problem Relation Age of Onset   Kidney failure Mother    Diabetes Mother    Alzheimer's disease Mother    Alzheimer's disease Father    Prostate cancer Father    Diabetes  Maternal Grandmother    Diabetes Maternal Grandfather    Alzheimer's disease Paternal Grandmother    Alzheimer's disease Paternal Grandfather    Prostate cancer Paternal Uncle      Current Outpatient Medications:    acetaminophen (TYLENOL) 500 MG tablet, Take 1 tablet (500 mg total) by mouth every 6 (six) hours as needed., Disp: 30 tablet, Rfl: 0   atorvastatin (LIPITOR) 80 MG tablet, TAKE 1 TABLET BY MOUTH ONCE  DAILY, Disp: 100 tablet, Rfl: 2   azelastine (OPTIVAR) 0.05 % ophthalmic solution, Place 1 drop into both eyes 2 (two) times daily., Disp: 6 mL, Rfl: 5   Budeson-Glycopyrrol-Formoterol (BREZTRI AEROSPHERE) 160-9-4.8 MCG/ACT AERO, USE 2 INHALATIONS BY MOUTH  DAILY, Disp: 32.1 g, Rfl: 2   cholecalciferol (VITAMIN D3) 25 MCG (1000 UNIT) tablet, Take 1,000 Units by mouth daily., Disp: , Rfl:    citalopram (CELEXA) 20 MG tablet, Take 1 tablet (20 mg total) by mouth daily., Disp: 90 tablet, Rfl: 1   clopidogrel (PLAVIX) 75 MG tablet, TAKE 1 TABLET BY MOUTH DAILY, Disp: 100 tablet, Rfl: 2   desonide (DESOWEN) 0.05 % ointment, Apply 1 Application topically 2 (two) times daily. for itchy rash, Disp: 60 g, Rfl: 5   diclofenac Sodium (VOLTAREN) 1 % GEL, Apply topically 4 (four) times daily., Disp: , Rfl:    donepezil (ARICEPT) 10 MG tablet, TAKE 1 TABLET BY MOUTH AT  BEDTIME, Disp: 100 tablet, Rfl: 2  fluorometholone (FML) 0.1 % ophthalmic suspension, 1 drop 4 (four) times daily., Disp: , Rfl:    gabapentin (NEURONTIN) 100 MG capsule, TAKE 1 CAPSULE BY MOUTH 3 TIMES  DAILY AS NEEDED, Disp: 300 capsule, Rfl: 2   hydrochlorothiazide (HYDRODIURIL) 12.5 MG tablet, , Disp: , Rfl:    levocetirizine (XYZAL) 5 MG tablet, Take 1 tablet (5 mg total) by mouth every evening., Disp: 30 tablet, Rfl: 11   meclizine (ANTIVERT) 25 MG tablet, TAKE 1 TABLET BY MOUTH 2  TIMES EVERY DAY AS NEEDED, Disp: 180 tablet, Rfl: 1   olmesartan-hydrochlorothiazide (BENICAR HCT) 40-12.5 MG tablet, TAKE 1 TABLET BY MOUTH  DAILY, Disp: 100 tablet, Rfl: 2   Olopatadine HCl 0.6 % SOLN, 2 sprays each nostril 2 times daily as needed for runny nose/ itch., Disp: 30.5 g, Rfl: 5   ONETOUCH ULTRA test strip, USE WITH METER TO CHECK BLOOD  SUGAR BEFORE BREAKFAST AND  BEFORE DINNER, Disp: 200 strip, Rfl: 2   pantoprazole (PROTONIX) 40 MG tablet, Take 1 tablet (40 mg total) by mouth daily., Disp: 30 tablet, Rfl: 6   polyethylene glycol (MIRALAX / GLYCOLAX) 17 g packet, Take 17 g by mouth 2 (two) times daily., Disp: 60 packet, Rfl: 3   Probiotic Product (PROBIOTIC-10 PO), Take by mouth. Take one tablet daily, Disp: , Rfl:    Semaglutide (RYBELSUS) 7 MG TABS, Take 1 tablet by mouth daily., Disp: 90 tablet, Rfl: 1   vitamin C (ASCORBIC ACID) 500 MG tablet, Take 500 mg by mouth daily., Disp: , Rfl:    VITAMIN D PO, Take 1 tablet by mouth daily., Disp: , Rfl:    ezetimibe (ZETIA) 10 MG tablet, Take 1 tablet (10 mg total) by mouth daily., Disp: 90 tablet, Rfl: 3  Current Facility-Administered Medications:    0.9 %  sodium chloride infusion, 500 mL, Intravenous, Once, Hilarie Fredrickson, MD   No Known Allergies    The patient states she is post menopausal status.  No LMP recorded. Patient is postmenopausal.   Negative for Dysmenorrhea and Negative for Menorrhagia. Negative for: breast discharge, breast lump(s), breast pain and breast self exam. Associated symptoms include abnormal vaginal bleeding. Pertinent negatives include abnormal bleeding (hematology), anxiety, decreased libido, depression, difficulty falling sleep, dyspareunia, history of infertility, nocturia, sexual dysfunction, sleep disturbances, urinary incontinence, urinary urgency, vaginal discharge and vaginal itching. Diet regular; she will cook oatmeal with fruit and will use her ensure in her oatmeal. She will wait until later in the day to eat. The patient states her exercise level is minimal - she is going to water aerobics two days a week.   The patient's tobacco use  is:  Social History   Tobacco Use  Smoking Status Former   Current packs/day: 0.00   Average packs/day: 0.3 packs/day for 30.0 years (9.0 ttl pk-yrs)   Types: Cigarettes   Start date: 63   Quit date: 2019   Years since quitting: 5.7  Smokeless Tobacco Never   She has been exposed to passive smoke. The patient's alcohol use is:  Social History   Substance and Sexual Activity  Alcohol Use Not Currently   Comment: occasional    Review of Systems  Constitutional: Negative.   HENT: Negative.    Eyes: Negative.   Respiratory: Negative.    Cardiovascular: Negative.   Gastrointestinal: Negative.  Negative for abdominal pain.  Endocrine: Negative.   Genitourinary: Negative.   Musculoskeletal: Negative.   Skin: Negative.   Allergic/Immunologic: Negative.   Neurological:  Positive for headaches. Negative for dizziness.  Hematological: Negative.   Psychiatric/Behavioral: Negative.       Today's Vitals   10/14/22 1009  BP: 116/72  Pulse: 85  Temp: 97.8 F (36.6 C)  TempSrc: Oral  Weight: 198 lb 9.6 oz (90.1 kg)  Height: 5\' 3"  (1.6 m)  PainSc: 0-No pain   Body mass index is 35.18 kg/m.  Wt Readings from Last 3 Encounters:  10/14/22 198 lb 9.6 oz (90.1 kg)  09/25/22 185 lb (83.9 kg)  08/06/22 185 lb (83.9 kg)     Objective:  Physical Exam Vitals reviewed.  Constitutional:      General: She is not in acute distress.    Appearance: Normal appearance. She is well-developed. She is obese.  HENT:     Head: Atraumatic.     Right Ear: Hearing, tympanic membrane, ear canal and external ear normal. There is no impacted cerumen.     Left Ear: Hearing, tympanic membrane, ear canal and external ear normal. There is no impacted cerumen.     Nose: Nose normal.     Mouth/Throat:     Mouth: Mucous membranes are moist.  Eyes:     General: Lids are normal.     Conjunctiva/sclera: Conjunctivae normal.     Pupils: Pupils are equal, round, and reactive to light.      Funduscopic exam:    Right eye: No papilledema.        Left eye: No papilledema.  Neck:     Thyroid: No thyroid mass.     Vascular: No carotid bruit.  Cardiovascular:     Rate and Rhythm: Normal rate and regular rhythm.     Pulses: Normal pulses.     Heart sounds: Normal heart sounds. No murmur heard. Pulmonary:     Effort: Pulmonary effort is normal. No respiratory distress.     Breath sounds: Normal breath sounds. No wheezing.  Abdominal:     General: Abdomen is flat. Bowel sounds are normal.     Palpations: Abdomen is soft.  Musculoskeletal:        General: No swelling. Normal range of motion.     Cervical back: Full passive range of motion without pain, normal range of motion and neck supple.     Thoracic back: Scoliosis present.     Right lower leg: No edema.     Left lower leg: No edema.  Skin:    General: Skin is warm and dry.     Capillary Refill: Capillary refill takes less than 2 seconds.  Neurological:     General: No focal deficit present.     Mental Status: She is alert and oriented to person, place, and time.     Cranial Nerves: No cranial nerve deficit.     Sensory: No sensory deficit.     Motor: No weakness.  Psychiatric:        Mood and Affect: Mood normal.        Behavior: Behavior normal.        Thought Content: Thought content normal.        Judgment: Judgment normal.         Assessment And Plan:     Encounter for annual health examination Assessment & Plan: Behavior modifications discussed and diet history reviewed.   Pt will continue to exercise regularly and modify diet with low GI, plant based foods and decrease intake of processed foods.  Recommend intake of daily multivitamin, Vitamin D, and calcium.  Recommend mammogram  and colonoscopy for preventive screenings, as well as recommend immunizations that include influenza, TDAP, and Shingles    Class 2 obesity due to excess calories with body mass index (BMI) of 35.0 to 35.9 in adult,  unspecified whether serious comorbidity present Assessment & Plan: She is encouraged to strive for BMI less than 30 to decrease cardiac risk. Goal to exercise 150 minutes per week with at least 2 days of strength training Encouraged to park further when at the store, take stairs instead of elevators and to walk in place during commercials. Increase water intake to at least one gallon of water daily.   Orders: -     Amb Referral To Provider Referral Exercise Program (P.R.E.P)  Need for influenza vaccination Assessment & Plan: Influenza vaccine administered Encouraged to take Tylenol as needed for fever or muscle aches.   Orders: -     Flu Vaccine Trivalent High Dose (Fluad)  Benign hypertension with CKD (chronic kidney disease) stage III (HCC) -     CMP14+EGFR  Type 2 diabetes mellitus with other circulatory complication, without long-term current use of insulin (HCC) Assessment & Plan: HgbA1c is stable. Continue current medications.   Orders: -     Microalbumin / creatinine urine ratio -     CMP14+EGFR -     Hemoglobin A1c  Stage 3a chronic kidney disease (HCC)  Peripheral vascular disease (HCC) Assessment & Plan: Continue statin, tolerating well.   Orders: -     Lipid panel  COPD with asthma Assessment & Plan: Stable at this time. Continue f/u with Pulmonology   Hot flashes Assessment & Plan: Will check for metabolic causes.    Other long term (current) drug therapy -     CBC with Differential/Platelet  Decreased energy Assessment & Plan: Will check for metabolic causes.  Orders: -     Iron, TIBC and Ferritin Panel -     Vitamin B12  Type 2 diabetes mellitus with stage 3a chronic kidney disease, without long-term current use of insulin (HCC) Assessment & Plan: HgbA1c is stable. Continue current medications.    Controlled type 2 diabetes mellitus with stage 3 chronic kidney disease, without long-term current use of insulin (HCC) Assessment &  Plan: HgbA1c is stable. Continue current medications.       Return for controlled DM check 4 months, 1 year physical. Patient was given opportunity to ask questions. Patient verbalized understanding of the plan and was able to repeat key elements of the plan. All questions were answered to their satisfaction.   Arnette Felts, FNP  I, Arnette Felts, FNP, have reviewed all documentation for this visit. The documentation on 10/14/22 for the exam, diagnosis, procedures, and orders are all accurate and complete.

## 2022-10-15 LAB — IRON,TIBC AND FERRITIN PANEL
Ferritin: 89 ng/mL (ref 15–150)
Iron Saturation: 26 % (ref 15–55)
Iron: 82 ug/dL (ref 27–139)
Total Iron Binding Capacity: 320 ug/dL (ref 250–450)
UIBC: 238 ug/dL (ref 118–369)

## 2022-10-15 LAB — CBC WITH DIFFERENTIAL/PLATELET
Basophils Absolute: 0.1 10*3/uL (ref 0.0–0.2)
Basos: 2 %
EOS (ABSOLUTE): 0.2 10*3/uL (ref 0.0–0.4)
Eos: 3 %
Hematocrit: 35.3 % (ref 34.0–46.6)
Hemoglobin: 11.4 g/dL (ref 11.1–15.9)
Immature Grans (Abs): 0 10*3/uL (ref 0.0–0.1)
Immature Granulocytes: 0 %
Lymphocytes Absolute: 2.2 10*3/uL (ref 0.7–3.1)
Lymphs: 34 %
MCH: 27.9 pg (ref 26.6–33.0)
MCHC: 32.3 g/dL (ref 31.5–35.7)
MCV: 86 fL (ref 79–97)
Monocytes Absolute: 0.5 10*3/uL (ref 0.1–0.9)
Monocytes: 8 %
Neutrophils Absolute: 3.4 10*3/uL (ref 1.4–7.0)
Neutrophils: 53 %
Platelets: 219 10*3/uL (ref 150–450)
RBC: 4.09 x10E6/uL (ref 3.77–5.28)
RDW: 13.6 % (ref 11.7–15.4)
WBC: 6.5 10*3/uL (ref 3.4–10.8)

## 2022-10-15 LAB — CMP14+EGFR
ALT: 22 IU/L (ref 0–32)
AST: 23 IU/L (ref 0–40)
Albumin: 4.5 g/dL (ref 3.7–4.7)
Alkaline Phosphatase: 126 IU/L — ABNORMAL HIGH (ref 44–121)
BUN/Creatinine Ratio: 9 — ABNORMAL LOW (ref 12–28)
BUN: 13 mg/dL (ref 8–27)
Bilirubin Total: 0.3 mg/dL (ref 0.0–1.2)
CO2: 21 mmol/L (ref 20–29)
Calcium: 9.8 mg/dL (ref 8.7–10.3)
Chloride: 102 mmol/L (ref 96–106)
Creatinine, Ser: 1.46 mg/dL — ABNORMAL HIGH (ref 0.57–1.00)
Globulin, Total: 2.5 g/dL (ref 1.5–4.5)
Glucose: 90 mg/dL (ref 70–99)
Potassium: 4.2 mmol/L (ref 3.5–5.2)
Sodium: 140 mmol/L (ref 134–144)
Total Protein: 7 g/dL (ref 6.0–8.5)
eGFR: 36 mL/min/{1.73_m2} — ABNORMAL LOW (ref >59)

## 2022-10-15 LAB — LIPID PANEL
Chol/HDL Ratio: 3.8 ratio (ref 0.0–4.4)
Cholesterol, Total: 187 mg/dL (ref 100–199)
HDL: 49 mg/dL (ref >39)
LDL Chol Calc (NIH): 75 mg/dL (ref 0–99)
Triglycerides: 398 mg/dL — ABNORMAL HIGH (ref 0–149)
VLDL Cholesterol Cal: 63 mg/dL — ABNORMAL HIGH (ref 5–40)

## 2022-10-15 LAB — MICROALBUMIN / CREATININE URINE RATIO
Creatinine, Urine: 130.6 mg/dL
Microalb/Creat Ratio: 4 mg/g creat (ref 0–29)
Microalbumin, Urine: 4.7 ug/mL

## 2022-10-15 LAB — HEMOGLOBIN A1C
Est. average glucose Bld gHb Est-mCnc: 154 mg/dL
Hgb A1c MFr Bld: 7 % — ABNORMAL HIGH (ref 4.8–5.6)

## 2022-10-15 LAB — VITAMIN B12: Vitamin B-12: 1222 pg/mL (ref 232–1245)

## 2022-10-21 ENCOUNTER — Telehealth: Payer: Self-pay

## 2022-10-21 NOTE — Telephone Encounter (Signed)
Call to pt reference PREP referral. Can do PREP MW 230p-345p starting Oct at the [facility]. Will call pt closer to start to do intake.  Pt previously attempted PREP but did not finish.

## 2022-10-24 DIAGNOSIS — R5383 Other fatigue: Secondary | ICD-10-CM | POA: Insufficient documentation

## 2022-10-24 NOTE — Assessment & Plan Note (Signed)
Continue statin, tolerating well

## 2022-10-24 NOTE — Assessment & Plan Note (Signed)
Stable at this time. Continue f/u with Pulmonology

## 2022-10-24 NOTE — Assessment & Plan Note (Signed)
Behavior modifications discussed and diet history reviewed.   Pt will continue to exercise regularly and modify diet with low GI, plant based foods and decrease intake of processed foods.  Recommend intake of daily multivitamin, Vitamin D, and calcium.  Recommend mammogram and colonoscopy for preventive screenings, as well as recommend immunizations that include influenza, TDAP, and Shingles  

## 2022-10-24 NOTE — Assessment & Plan Note (Signed)
She is encouraged to strive for BMI less than 30 to decrease cardiac risk. Goal to exercise 150 minutes per week with at least 2 days of strength training Encouraged to park further when at the store, take stairs instead of elevators and to walk in place during commercials. Increase water intake to at least one gallon of water daily.

## 2022-10-24 NOTE — Assessment & Plan Note (Signed)
Will check for metabolic causes.

## 2022-10-24 NOTE — Assessment & Plan Note (Signed)
HgbA1c is stable. Continue current medications

## 2022-10-24 NOTE — Assessment & Plan Note (Signed)
Influenza vaccine administered Encouraged to take Tylenol as needed for fever or muscle aches.

## 2022-11-01 ENCOUNTER — Telehealth: Payer: Self-pay

## 2022-11-01 NOTE — Telephone Encounter (Signed)
VMT pt requesting call back. Wanting to schedule intake for class starting Oct 21st.

## 2022-11-02 ENCOUNTER — Other Ambulatory Visit: Payer: Self-pay

## 2022-11-02 ENCOUNTER — Emergency Department (HOSPITAL_COMMUNITY)
Admission: EM | Admit: 2022-11-02 | Discharge: 2022-11-02 | Disposition: A | Discharge status: Home / Self Care | Payer: Medicare Other | Attending: Emergency Medicine | Admitting: Emergency Medicine

## 2022-11-02 ENCOUNTER — Emergency Department (HOSPITAL_COMMUNITY): Payer: Medicare Other

## 2022-11-02 DIAGNOSIS — R42 Dizziness and giddiness: Secondary | ICD-10-CM | POA: Insufficient documentation

## 2022-11-02 DIAGNOSIS — R531 Weakness: Secondary | ICD-10-CM | POA: Insufficient documentation

## 2022-11-02 DIAGNOSIS — Z79899 Other long term (current) drug therapy: Secondary | ICD-10-CM | POA: Diagnosis not present

## 2022-11-02 DIAGNOSIS — I959 Hypotension, unspecified: Secondary | ICD-10-CM | POA: Diagnosis not present

## 2022-11-02 DIAGNOSIS — Z1152 Encounter for screening for COVID-19: Secondary | ICD-10-CM | POA: Diagnosis not present

## 2022-11-02 DIAGNOSIS — R9082 White matter disease, unspecified: Secondary | ICD-10-CM | POA: Diagnosis not present

## 2022-11-02 DIAGNOSIS — Z7902 Long term (current) use of antithrombotics/antiplatelets: Secondary | ICD-10-CM | POA: Insufficient documentation

## 2022-11-02 DIAGNOSIS — R29818 Other symptoms and signs involving the nervous system: Secondary | ICD-10-CM | POA: Diagnosis not present

## 2022-11-02 LAB — CBC WITH DIFFERENTIAL/PLATELET
Abs Immature Granulocytes: 0.03 10*3/uL (ref 0.00–0.07)
Basophils Absolute: 0.1 10*3/uL (ref 0.0–0.1)
Basophils Relative: 1 %
Eosinophils Absolute: 0.1 10*3/uL (ref 0.0–0.5)
Eosinophils Relative: 1 %
HCT: 33.3 % — ABNORMAL LOW (ref 36.0–46.0)
Hemoglobin: 10.6 g/dL — ABNORMAL LOW (ref 12.0–15.0)
Immature Granulocytes: 0 %
Lymphocytes Relative: 26 %
Lymphs Abs: 1.9 10*3/uL (ref 0.7–4.0)
MCH: 29.3 pg (ref 26.0–34.0)
MCHC: 31.8 g/dL (ref 30.0–36.0)
MCV: 92 fL (ref 80.0–100.0)
Monocytes Absolute: 0.7 10*3/uL (ref 0.1–1.0)
Monocytes Relative: 9 %
Neutro Abs: 4.7 10*3/uL (ref 1.7–7.7)
Neutrophils Relative %: 63 %
Platelets: 196 10*3/uL (ref 150–400)
RBC: 3.62 MIL/uL — ABNORMAL LOW (ref 3.87–5.11)
RDW: 14.7 % (ref 11.5–15.5)
WBC: 7.4 10*3/uL (ref 4.0–10.5)
nRBC: 0 % (ref 0.0–0.2)

## 2022-11-02 LAB — URINALYSIS, ROUTINE W REFLEX MICROSCOPIC
Bilirubin Urine: NEGATIVE
Glucose, UA: NEGATIVE mg/dL
Hgb urine dipstick: NEGATIVE
Ketones, ur: NEGATIVE mg/dL
Leukocytes,Ua: NEGATIVE
Nitrite: NEGATIVE
Protein, ur: NEGATIVE mg/dL
Specific Gravity, Urine: 1.006 (ref 1.005–1.030)
pH: 7 (ref 5.0–8.0)

## 2022-11-02 LAB — TROPONIN I (HIGH SENSITIVITY)
Troponin I (High Sensitivity): 5 ng/L (ref <18)
Troponin I (High Sensitivity): 4 ng/L (ref <18)

## 2022-11-02 LAB — ETHANOL: Alcohol, Ethyl (B): <10 mg/dL (ref <10)

## 2022-11-02 LAB — COMPREHENSIVE METABOLIC PANEL
ALT: 16 U/L (ref 0–44)
AST: 27 U/L (ref 15–41)
Albumin: 3.9 g/dL (ref 3.5–5.0)
Alkaline Phosphatase: 99 U/L (ref 38–126)
Anion gap: 14 (ref 5–15)
BUN: 14 mg/dL (ref 8–23)
CO2: 21 mmol/L — ABNORMAL LOW (ref 22–32)
Calcium: 9.4 mg/dL (ref 8.9–10.3)
Chloride: 103 mmol/L (ref 98–111)
Creatinine, Ser: 1.68 mg/dL — ABNORMAL HIGH (ref 0.44–1.00)
GFR, Estimated: 30 mL/min — ABNORMAL LOW (ref >60)
Glucose, Bld: 117 mg/dL — ABNORMAL HIGH (ref 70–99)
Potassium: 4.7 mmol/L (ref 3.5–5.1)
Sodium: 138 mmol/L (ref 135–145)
Total Bilirubin: 0.5 mg/dL (ref 0.3–1.2)
Total Protein: 7.4 g/dL (ref 6.5–8.1)

## 2022-11-02 LAB — APTT: aPTT: 26 s (ref 24–36)

## 2022-11-02 LAB — SARS CORONAVIRUS 2 BY RT PCR: SARS Coronavirus 2 by RT PCR: NEGATIVE

## 2022-11-02 LAB — RAPID URINE DRUG SCREEN, HOSP PERFORMED
Amphetamines: NOT DETECTED
Barbiturates: NOT DETECTED
Benzodiazepines: NOT DETECTED
Cocaine: NOT DETECTED
Opiates: NOT DETECTED
Tetrahydrocannabinol: POSITIVE — AB

## 2022-11-02 LAB — PROTIME-INR
INR: 1.1 (ref 0.8–1.2)
Prothrombin Time: 14.1 s (ref 11.4–15.2)

## 2022-11-02 MED ORDER — LACTATED RINGERS IV BOLUS
1000.0000 mL | Freq: Once | INTRAVENOUS | Status: AC
  Administered 2022-11-02: 1000 mL via INTRAVENOUS

## 2022-11-02 NOTE — Discharge Instructions (Signed)
Make sure you drink an appropriate amount of fluids. Follow up closely with your doctor.   If you develop new or worsening weakness, fever, headache, or any other new/concerning symptoms, then return to the ER or call 911.

## 2022-11-02 NOTE — ED Notes (Signed)
PT ambulated with assistance, no complications during ambulation, PT had no complaints during ambulation.

## 2022-11-02 NOTE — ED Triage Notes (Signed)
Pt BIBGEMS from home after having an acute onset of dizziness and bilateral lower weakness. Pt unable to get out of chair. LNK 1610.   120/72 60 hr 100 RA 146 CBG  LVO and VAN -.

## 2022-11-02 NOTE — ED Provider Notes (Signed)
[facility] AT [facility] Provider Note   CSN: [id number] Arrival date & time: 11/02/22  1749     History  Chief Complaint  Patient presents with   Weakness    Donna Gilmore is a 82 y.o. female.  HPI 82 year old female presents with acute weakness.  Per her report this started around 1 PM.  Feels like her head is in a fog and she is lightheaded and weak.  Feels like both legs in her head are too heavy.  She denies any headache, acute vision changes, speech changes, vomiting, diarrhea, chest pain or shortness of breath.  No focal weakness or numbness.  Felt lightheaded when she was standing.  Overall is not a great historian.  Home Medications Prior to Admission medications   Medication Sig Start Date End Date Taking? Authorizing Provider  acetaminophen (TYLENOL) 500 MG tablet Take 1 tablet (500 mg total) by mouth every 6 (six) hours as needed. 08/13/18   Merrilee Jansky, MD  atorvastatin (LIPITOR) 80 MG tablet TAKE 1 TABLET BY MOUTH ONCE  DAILY 06/18/22   Arnette Felts, FNP  azelastine (OPTIVAR) 0.05 % ophthalmic solution Place 1 drop into both eyes 2 (two) times daily. 06/06/22   Marcelyn Bruins, MD  Budeson-Glycopyrrol-Formoterol (BREZTRI AEROSPHERE) 160-9-4.8 MCG/ACT AERO USE 2 INHALATIONS BY MOUTH  DAILY 03/11/22   Arnette Felts, FNP  cholecalciferol (VITAMIN D3) 25 MCG (1000 UNIT) tablet Take 1,000 Units by mouth daily.    [provider]  citalopram (CELEXA) 20 MG tablet Take 1 tablet (20 mg total) by mouth daily. 06/12/22   Arnette Felts, FNP  clopidogrel (PLAVIX) 75 MG tablet TAKE 1 TABLET BY MOUTH DAILY 08/12/22   Arnette Felts, FNP  desonide (DESOWEN) 0.05 % ointment Apply 1 Application topically 2 (two) times daily. for itchy rash 06/06/22   Marcelyn Bruins, MD  diclofenac Sodium (VOLTAREN) 1 % GEL Apply topically 4 (four) times daily.    [provider]  donepezil (ARICEPT) 10 MG tablet TAKE 1 TABLET BY MOUTH  AT  BEDTIME 04/18/22   Arnette Felts, FNP  ezetimibe (ZETIA) 10 MG tablet Take 1 tablet (10 mg total) by mouth daily. 09/07/21 09/02/22  Chrystie Nose, MD  fluorometholone (FML) 0.1 % ophthalmic suspension 1 drop 4 (four) times daily. 08/02/21   [provider]  gabapentin (NEURONTIN) 100 MG capsule TAKE 1 CAPSULE BY MOUTH 3 TIMES  DAILY AS NEEDED 04/19/22   Arnette Felts, FNP  hydrochlorothiazide (HYDRODIURIL) 12.5 MG tablet     [provider]  levocetirizine (XYZAL) 5 MG tablet Take 1 tablet (5 mg total) by mouth every evening. 06/07/22   Marcelyn Bruins, MD  meclizine (ANTIVERT) 25 MG tablet TAKE 1 TABLET BY MOUTH 2  TIMES EVERY DAY AS NEEDED 02/11/22   Arnette Felts, FNP  olmesartan-hydrochlorothiazide (BENICAR HCT) 40-12.5 MG tablet TAKE 1 TABLET BY MOUTH DAILY 06/18/22   Arnette Felts, FNP  Olopatadine HCl 0.6 % SOLN 2 sprays each nostril 2 times daily as needed for runny nose/ itch. 06/06/22   Padgett, Pilar Grammes, MD  [facility] ULTRA test strip USE WITH METER TO CHECK BLOOD  SUGAR BEFORE BREAKFAST AND  BEFORE DINNER 09/16/22   Arnette Felts, FNP  pantoprazole (PROTONIX) 40 MG tablet Take 1 tablet (40 mg total) by mouth daily. 09/25/22 09/25/23  Ellender Hose, NP  polyethylene glycol (MIRALAX / GLYCOLAX) 17 g packet Take 17 g by mouth 2 (two) times daily. 10/02/22   Doree Albee,  PA-C  Probiotic Product (PROBIOTIC-10 PO) Take by mouth. Take one tablet daily    [provider]  Semaglutide (RYBELSUS) 7 MG TABS Take 1 tablet by mouth daily. 02/12/22   Arnette Felts, FNP  vitamin C (ASCORBIC ACID) 500 MG tablet Take 500 mg by mouth daily.    [provider]  VITAMIN D PO Take 1 tablet by mouth daily.    [provider]      Allergies    Patient has no known allergies.    Review of Systems   Review of Systems  Constitutional:  Negative for fever.  Respiratory:  Negative for shortness of breath.   Cardiovascular:  Negative for chest pain.   Gastrointestinal:  Negative for abdominal pain, diarrhea and vomiting.  Neurological:  Positive for weakness and light-headedness. Negative for numbness and headaches.    Physical Exam Updated Vital Signs BP (!) 143/69   Pulse (!) 55   Temp 98.5 F (36.9 C) (Oral)   Resp (!) 23   SpO2 100%  Physical Exam Vitals and nursing note reviewed.  Constitutional:      Appearance: She is well-developed. She is not ill-appearing or diaphoretic.  HENT:     Head: Normocephalic and atraumatic.  Eyes:     Extraocular Movements: Extraocular movements intact.     Pupils: Pupils are equal, round, and reactive to light.  Cardiovascular:     Rate and Rhythm: Normal rate and regular rhythm.     Heart sounds: Normal heart sounds.  Pulmonary:     Effort: Pulmonary effort is normal.     Breath sounds: Normal breath sounds.  Abdominal:     General: There is no distension.     Palpations: Abdomen is soft.     Tenderness: There is no abdominal tenderness.  Skin:    General: Skin is warm and dry.  Neurological:     Mental Status: She is alert and oriented to person, place, and time.     Comments: CN 3-12 grossly intact. 5/5 strength in both upper extremities. Can lift both legs a few inches off stretcher (symmetrically weak). Grossly normal sensation. Normal finger to nose.      ED Results / Procedures / Treatments   Labs (all labs ordered are listed, but only abnormal results are displayed) Labs Reviewed  COMPREHENSIVE METABOLIC PANEL - Abnormal; Notable for the following components:      Result Value   CO2 21 (*)    Glucose, Bld 117 (*)    Creatinine, Ser 1.68 (*)    GFR, Estimated 30 (*)    All other components within normal limits  RAPID URINE DRUG SCREEN, HOSP PERFORMED - Abnormal; Notable for the following components:   Tetrahydrocannabinol POSITIVE (*)    All other components within normal limits  URINALYSIS, ROUTINE W REFLEX MICROSCOPIC - Abnormal; Notable for the following  components:   Color, Urine STRAW (*)    All other components within normal limits  CBC WITH DIFFERENTIAL/PLATELET - Abnormal; Notable for the following components:   RBC 3.62 (*)    Hemoglobin 10.6 (*)    HCT 33.3 (*)    All other components within normal limits  SARS CORONAVIRUS 2 BY RT PCR  ETHANOL  PROTIME-INR  APTT  CBC WITH DIFFERENTIAL/PLATELET  CBG MONITORING, ED  TROPONIN I (HIGH SENSITIVITY)  TROPONIN I (HIGH SENSITIVITY)    EKG EKG Interpretation Date/Time:  Saturday November 02 2022 19:39:03 EDT Ventricular Rate:  60 PR Interval:  136 QRS Duration:  88 QT Interval:  434 QTC Calculation: 434 R Axis:   28  Text Interpretation: Sinus rhythm Low voltage, precordial leads nonspecific changes, similar to April 2024 Confirmed by Pricilla Loveless [id number]) on 11/02/2022 7:52:25 PM  Radiology CT HEAD WO CONTRAST  Result Date: 11/02/2022 CLINICAL DATA:  Acute neurologic deficit, acute dizziness EXAM: CT HEAD WITHOUT CONTRAST TECHNIQUE: Contiguous axial images were obtained from the base of the skull through the vertex without intravenous contrast. RADIATION DOSE REDUCTION: This exam was performed according to the departmental dose-optimization program which includes automated exposure control, adjustment of the mA and/or kV according to patient size and/or use of iterative reconstruction technique. COMPARISON:  None Available. FINDINGS: Brain: Normal anatomic configuration. Parenchymal volume loss is commensurate with the patient's age. Mild periventricular white matter changes are present likely reflecting the sequela of small vessel ischemia. No abnormal intra or extra-axial mass lesion or fluid collection. No abnormal mass effect or midline shift. No evidence of acute intracranial hemorrhage or infarct. Ventricular size is normal. Cerebellum unremarkable. Vascular: No asymmetric hyperdense vasculature at the skull base. Skull: Intact Sinuses/Orbits: Paranasal sinuses are clear. Orbits  are unremarkable. Other: Mastoid air cells and middle ear cavities are clear. IMPRESSION: 1. No acute intracranial hemorrhage or infarct. 2. Mild senescent change. Electronically Signed   By: Helyn Numbers M.D.   On: 11/02/2022 20:01   DG Chest 2 View  Result Date: 11/02/2022 CLINICAL DATA:  Generalized weakness EXAM: CHEST - 2 VIEW COMPARISON:  07/04/2020 FINDINGS: Lungs are well expanded, symmetric, and clear. No pneumothorax or pleural effusion. Cardiac size within normal limits. Pulmonary vascularity is normal. Osseous structures are age-appropriate. No acute bone abnormality. IMPRESSION: No active cardiopulmonary disease. Electronically Signed   By: Helyn Numbers M.D.   On: 11/02/2022 19:58    Procedures Procedures    Medications Ordered in ED Medications  lactated ringers bolus 1,000 mL (0 mLs Intravenous Stopped 11/02/22 2306)    ED Course/ Medical Decision Making/ A&P                                 Medical Decision Making Amount and/or Complexity of Data Reviewed Labs: ordered.    Details: Normal troponins x 2.  Mild bump in her creatinine but not quite an AKI.  Negative UA. Radiology: ordered.    Details: No pneumonia.  No head bleed. ECG/medicine tests: ordered and independent interpretation performed.    Details: No ischemia   Patient presents with acute generalized weakness and feeling foggy in her head.  No focal findings on neuroexam.  She was given some fluids.  Workup is pretty unremarkable.  There have been no focal deficits I think stroke is highly unlikely.  After fluids he was able to get up and ambulate at her baseline.  She feels well now for discharge.  Unclear what caused her symptoms though she does have a mild bump in her creatinine was encouraged crease fluid intake.  Will discharge home with return precautions.        Final Clinical Impression(s) / ED Diagnoses Final diagnoses:  Generalized weakness    Rx / DC Orders ED Discharge Orders      None         Pricilla Loveless, MD 11/02/22 2307

## 2022-11-04 ENCOUNTER — Encounter: Payer: Self-pay | Admitting: Nurse Practitioner

## 2022-11-06 ENCOUNTER — Ambulatory Visit: Payer: Medicare Other | Admitting: Nurse Practitioner

## 2022-11-06 ENCOUNTER — Encounter: Payer: Self-pay | Admitting: Nurse Practitioner

## 2022-11-06 VITALS — BP 120/80 | HR 60 | Temp 97.7°F | Ht 63.0 in | Wt 188.2 lb

## 2022-11-06 DIAGNOSIS — E1159 Type 2 diabetes mellitus with other circulatory complications: Secondary | ICD-10-CM | POA: Diagnosis not present

## 2022-11-06 DIAGNOSIS — E6609 Other obesity due to excess calories: Secondary | ICD-10-CM

## 2022-11-06 DIAGNOSIS — E66811 Obesity, class 1: Secondary | ICD-10-CM

## 2022-11-06 DIAGNOSIS — R531 Weakness: Secondary | ICD-10-CM | POA: Diagnosis not present

## 2022-11-06 DIAGNOSIS — G3184 Mild cognitive impairment, so stated: Secondary | ICD-10-CM | POA: Diagnosis not present

## 2022-11-06 DIAGNOSIS — Z6833 Body mass index (BMI) 33.0-33.9, adult: Secondary | ICD-10-CM

## 2022-11-06 DIAGNOSIS — R42 Dizziness and giddiness: Secondary | ICD-10-CM

## 2022-11-06 DIAGNOSIS — E782 Mixed hyperlipidemia: Secondary | ICD-10-CM

## 2022-11-06 MED ORDER — MECLIZINE HCL 25 MG PO TABS: ORAL_TABLET | ORAL | 1 refills | Status: DC

## 2022-11-06 MED ORDER — DONEPEZIL HCL 10 MG PO TABS
10.0000 mg | ORAL_TABLET | Freq: Every day | ORAL | 2 refills | Status: DC
Start: 2022-11-06 — End: 2023-07-03

## 2022-11-06 MED ORDER — RYBELSUS 7 MG PO TABS
1.0000 | ORAL_TABLET | Freq: Every day | ORAL | 1 refills | Status: DC
Start: 2022-11-06 — End: 2022-11-25

## 2022-11-06 NOTE — Progress Notes (Addendum)
Madelaine Bhat, CMA,acting as a Neurosurgeon for Arnette Felts, FNP.,have documented all relevant documentation on the behalf of Arnette Felts, FNP,as directed by  Arnette Felts, FNP while in the presence of Arnette Felts, FNP.  Subjective:  Patient ID: Donna Gilmore , female    DOB: Jun 24, 1940 , 82 y.o.   MRN: [id number]  Chief Complaint  Patient presents with   Fatigue    HPI  Patient presents today for a ER follow up, Patient reports compliance with medication. Patient denies any chest pain, SOB, or headaches. Patient went to ER on 11/02/2022 she was diagnosed with Generalized weakness. She reports it is better now.  Patient reports her blood sugars have been too high recently. She report she takes her first reading before breakfast. On October 7th her blood sugar was up to 181 and 186.   When she went to the ER she had some brain fog. She had IV fluids while at the hospital. Her blood sugar has decreased to 152. Increased to 200 on 10/8 - she ate egg, bread and 2 slices of cheese and slice of tomato.    BP Readings from Last 3 Encounters: 11/06/22 : 120/80 11/02/22 : (!) 143/69 10/14/22 : 116/72       Past Medical History:  Diagnosis Date   Acute left-sided low back pain with left-sided sciatica 05/04/2018   Benign paroxysmal positional vertigo 08/26/2012   Chest pain 09/21/2012   CVA (cerebral infarction) 08/25/2012   Diabetes mellitus without complication (HCC)    Dyslipidemia 08/25/2012   Essential hypertension, benign 08/25/2012   Hyperlipidemia    Hypertension    Meralgia paraesthetica, left 01/12/2018   Stroke [facility])      Family History  Problem Relation Age of Onset   Kidney failure Mother    Diabetes Mother    Alzheimer's disease Mother    Alzheimer's disease Father    Prostate cancer Father    Diabetes Maternal Grandmother    Diabetes Maternal Grandfather    Alzheimer's disease Paternal Grandmother    Alzheimer's disease Paternal Grandfather    Prostate cancer  Paternal Uncle      Current Outpatient Medications:    acetaminophen (TYLENOL) 500 MG tablet, Take 1 tablet (500 mg total) by mouth every 6 (six) hours as needed., Disp: 30 tablet, Rfl: 0   atorvastatin (LIPITOR) 80 MG tablet, TAKE 1 TABLET BY MOUTH ONCE  DAILY, Disp: 100 tablet, Rfl: 2   azelastine (OPTIVAR) 0.05 % ophthalmic solution, Place 1 drop into both eyes 2 (two) times daily., Disp: 6 mL, Rfl: 5   cholecalciferol (VITAMIN D3) 25 MCG (1000 UNIT) tablet, Take 1,000 Units by mouth daily., Disp: , Rfl:    clopidogrel (PLAVIX) 75 MG tablet, TAKE 1 TABLET BY MOUTH DAILY, Disp: 100 tablet, Rfl: 2   desonide (DESOWEN) 0.05 % ointment, Apply 1 Application topically 2 (two) times daily. for itchy rash, Disp: 60 g, Rfl: 5   diclofenac Sodium (VOLTAREN) 1 % GEL, Apply topically 4 (four) times daily. (Patient not taking: Reported on 11/25/2022), Disp: , Rfl:    gabapentin (NEURONTIN) 100 MG capsule, TAKE 1 CAPSULE BY MOUTH 3 TIMES  DAILY AS NEEDED, Disp: 300 capsule, Rfl: 2   levocetirizine (XYZAL) 5 MG tablet, Take 1 tablet (5 mg total) by mouth every evening., Disp: 30 tablet, Rfl: 11   olmesartan-hydrochlorothiazide (BENICAR HCT) 40-12.5 MG tablet, TAKE 1 TABLET BY MOUTH DAILY, Disp: 100 tablet, Rfl: 2   Olopatadine HCl 0.6 % SOLN, 2 sprays each nostril  2 times daily as needed for runny nose/ itch., Disp: 30.5 g, Rfl: 5   ONETOUCH ULTRA test strip, USE WITH METER TO CHECK BLOOD  SUGAR BEFORE BREAKFAST AND  BEFORE DINNER, Disp: 200 strip, Rfl: 2   pantoprazole (PROTONIX) 40 MG tablet, Take 1 tablet (40 mg total) by mouth daily., Disp: 30 tablet, Rfl: 6   polyethylene glycol (MIRALAX / GLYCOLAX) 17 g packet, Take 17 g by mouth 2 (two) times daily. (Patient not taking: Reported on 11/25/2022), Disp: 60 packet, Rfl: 3   vitamin C (ASCORBIC ACID) 500 MG tablet, Take 500 mg by mouth daily., Disp: , Rfl:    VITAMIN D PO, Take 1 tablet by mouth daily., Disp: , Rfl:    Budeson-Glycopyrrol-Formoterol  (BREZTRI AEROSPHERE) 160-9-4.8 MCG/ACT AERO, Inhale 2 puffs into the lungs 2 (two) times daily., Disp: 32.1 g, Rfl: 3   dapagliflozin propanediol (FARXIGA) 10 MG TABS tablet, Take 1 tablet (10 mg total) by mouth daily before breakfast., Disp: , Rfl:    donepezil (ARICEPT) 10 MG tablet, Take 1 tablet (10 mg total) by mouth at bedtime., Disp: 100 tablet, Rfl: 2   empagliflozin (JARDIANCE) 10 MG TABS tablet, Take 1 tablet (10 mg total) by mouth daily before breakfast., Disp: , Rfl:    ezetimibe (ZETIA) 10 MG tablet, Take 1 tablet (10 mg total) by mouth daily., Disp: 100 tablet, Rfl: 3   meclizine (ANTIVERT) 25 MG tablet, TAKE 1 TABLET BY MOUTH 2  TIMES EVERY DAY AS NEEDED (Patient not taking: Reported on 11/25/2022), Disp: 180 tablet, Rfl: 1  Current Facility-Administered Medications:    0.9 %  sodium chloride infusion, 500 mL, Intravenous, Once, Hilarie Fredrickson, MD   No Known Allergies   Review of Systems  Constitutional: Negative.   HENT: Negative.    Eyes: Negative.   Respiratory: Negative.    Cardiovascular: Negative.   Gastrointestinal: Negative.   Neurological: Negative.   Psychiatric/Behavioral: Negative.       Today's Vitals   11/06/22 1533  BP: 120/80  Pulse: 60  Temp: 97.7 F (36.5 C)  TempSrc: Oral  Weight: 188 lb 3.2 oz (85.4 kg)  Height: 5\' 3"  (1.6 m)  PainSc: 0-No pain   Body mass index is 33.34 kg/m.  Wt Readings from Last 3 Encounters:  11/18/22 188 lb 6.4 oz (85.5 kg)  11/06/22 188 lb 3.2 oz (85.4 kg)  10/14/22 198 lb 9.6 oz (90.1 kg)     Objective:  Physical Exam Vitals reviewed.  Constitutional:      General: She is not in acute distress.    Appearance: Normal appearance. She is obese.  HENT:     Ears:     Comments: Hard of hearing Cardiovascular:     Rate and Rhythm: Normal rate and regular rhythm.     Pulses: Normal pulses.     Heart sounds: Normal heart sounds. No murmur heard. Pulmonary:     Effort: Pulmonary effort is normal. No respiratory  distress.     Breath sounds: Normal breath sounds. No wheezing.  Musculoskeletal:        General: Normal range of motion.  Skin:    General: Skin is warm and dry.     Capillary Refill: Capillary refill takes less than 2 seconds.  Neurological:     General: No focal deficit present.     Mental Status: She is alert and oriented to person, place, and time.     Cranial Nerves: No cranial nerve deficit.  Motor: No weakness.  Psychiatric:        Mood and Affect: Mood normal.        Behavior: Behavior normal.        Thought Content: Thought content normal.        Judgment: Judgment normal.         Assessment And Plan:  Generalized weakness Assessment & Plan: Seen at ER for weakness, she is feeling better now after receiving IV fluids.    Type 2 diabetes mellitus with other circulatory complication, without long-term current use of insulin (HCC) Assessment & Plan: She has not been taking her Rybelsus and this is likely why her blood sugar has been elevated.   Orders: -     AMB Referral to Pharmacy Medication Management  Class 1 obesity due to excess calories with body mass index (BMI) of 33.0 to 33.9 in adult, unspecified whether serious comorbidity present Assessment & Plan: She is encouraged to strive for BMI less than 30 to decrease cardiac risk. Advised to aim for at least 150 minutes of exercise per week.    Vertigo Assessment & Plan: Refilled her medications.   Orders: -     Meclizine HCl; TAKE 1 TABLET BY MOUTH 2  TIMES EVERY DAY AS NEEDED (Patient not taking: Reported on 11/25/2022)  Dispense: 180 tablet; Refill: 1  Mild cognitive impairment Assessment & Plan: Refilled her donepezil  Orders: -     Donepezil HCl; Take 1 tablet (10 mg total) by mouth at bedtime.  Dispense: 100 tablet; Refill: 2  Mixed hyperlipidemia Assessment & Plan: Cholesterol levels are stable. Continue current medications.   Orders: -     AMB Referral to Pharmacy Medication  Management    No follow-ups on file.  Patient was given opportunity to ask questions. Patient verbalized understanding of the plan and was able to repeat key elements of the plan. All questions were answered to their satisfaction.   Jeanell Sparrow, FNP, have reviewed all documentation for this visit. The documentation on 11/06/22 for the exam, diagnosis, procedures, and orders are all accurate and complete.    IF YOU HAVE BEEN REFERRED TO A SPECIALIST, IT MAY TAKE 1-2 WEEKS TO SCHEDULE/PROCESS THE REFERRAL. IF YOU HAVE NOT HEARD FROM US/SPECIALIST IN TWO WEEKS, PLEASE GIVE [place] A CALL AT [phone].

## 2022-11-07 ENCOUNTER — Encounter: Payer: Self-pay | Admitting: Nurse Practitioner

## 2022-11-08 ENCOUNTER — Telehealth: Payer: Self-pay

## 2022-11-08 NOTE — Progress Notes (Signed)
Care Guide Note  11/08/2022 Name: Donna Gilmore MRN: [id number] DOB: 04-18-1940  Referred by: Arnette Felts, FNP Reason for referral : Care Coordination (Outreach to schedule with Pharm d )   Donna Gilmore is a 82 y.o. year old female who is a primary care patient of Arnette Felts, FNP. [name] was referred to the pharmacist for assistance related to HTN and DM.    Successful contact was made with the patient to discuss pharmacy services including being ready for the pharmacist to call at least 5 minutes before the scheduled appointment time, to have medication bottles and any blood sugar or blood pressure readings ready for review. The patient agreed to meet with the pharmacist via with the pharmacist via telephone visit on (date/time).  11/25/2022  Penne Lash, RMA Care Guide [facility]  [name], [place] [address] Direct Dial: [phone] [email or web address]

## 2022-11-14 ENCOUNTER — Other Ambulatory Visit: Payer: Self-pay | Admitting: Nurse Practitioner

## 2022-11-17 ENCOUNTER — Encounter: Payer: Self-pay | Admitting: Nurse Practitioner

## 2022-11-17 DIAGNOSIS — R42 Dizziness and giddiness: Secondary | ICD-10-CM | POA: Insufficient documentation

## 2022-11-17 DIAGNOSIS — E119 Type 2 diabetes mellitus without complications: Secondary | ICD-10-CM | POA: Insufficient documentation

## 2022-11-17 DIAGNOSIS — E66811 Obesity, class 1: Secondary | ICD-10-CM | POA: Insufficient documentation

## 2022-11-17 NOTE — Assessment & Plan Note (Signed)
She is encouraged to strive for BMI less than 30 to decrease cardiac risk. Advised to aim for at least 150 minutes of exercise per week.

## 2022-11-17 NOTE — Assessment & Plan Note (Signed)
She has not been taking her Rybelsus and this is likely why her blood sugar has been elevated.

## 2022-11-17 NOTE — Assessment & Plan Note (Signed)
Seen at ER for weakness, she is feeling better now after receiving IV fluids.

## 2022-11-17 NOTE — Assessment & Plan Note (Signed)
Refilled her medications.

## 2022-11-17 NOTE — Assessment & Plan Note (Signed)
Refilled her donepezil

## 2022-11-17 NOTE — Assessment & Plan Note (Signed)
Cholesterol levels are stable. Continue current medications.  

## 2022-11-19 NOTE — Progress Notes (Signed)
[facility] PREP Evaluation  Patient Details  Name: Donna Gilmore MRN: [id number] Date of Birth: 1940-08-12 Age: 82 y.o. PCP: Arnette Felts, FNP  Vitals:   11/18/22 1430  BP: (!) 122/58  Pulse: 67  SpO2: 99%  Weight: 188 lb 6.4 oz (85.5 kg)     [facility] Eval - 11/19/22 1100       [facility] "PREP" Location   [facility] "PREP" Location [facility]      Referral    Referring Provider [place]    Reason for referral Inactivity;Hypertension;Diabetes    Program Start Date 11/18/22   Presented to [facility] after class occurred wanting to join     Measurement   Waist Circumference 43.5 inches    Hip Circumference 46.5 inches    Body fat --   not measured due to age     Information for Trainer   Goals get stronger, gain energy, start exercise    Current Exercise none    Orthopedic Concerns LBP    Pertinent Medical History DM2, HTN, Stroke, PVD, COPD, vertigo,    Current Barriers son will transport    Restrictions/Precautions Diabetic snack before exercise;Fall risk    Medications that affect exercise Medication causing dizziness/drowsiness      Timed Up and Go (TUGS)   Timed Up and Go Moderate risk 10-12 seconds      Mobility and Daily Activities   I find it easy to walk up or down two or more flights of stairs. 1    I have no trouble taking out the trash. 1    I do housework such as vacuuming and dusting on my own without difficulty. 2    I can easily lift a gallon of milk (8lbs). 4    I can easily walk a mile. 1    I have no trouble reaching into high cupboards or reaching down to pick up something from the floor. 1    I do not have trouble doing out-door work such as Loss adjuster, chartered, raking leaves, or gardening. 1      Mobility and Daily Activities   I feel younger than my age. 1    I feel independent. 3    I feel energetic. 1    I live an active life.  1    I feel strong. 1    I feel healthy. 1    I feel active as other people my age. 1      How fit and strong are you.   Fit and  Strong Total Score 20            Past Medical History:  Diagnosis Date   Acute left-sided low back pain with left-sided sciatica 05/04/2018   Benign paroxysmal positional vertigo 08/26/2012   Chest pain 09/21/2012   CVA (cerebral infarction) 08/25/2012   Diabetes mellitus without complication (HCC)    Dyslipidemia 08/25/2012   Essential hypertension, benign 08/25/2012   Hyperlipidemia    Hypertension    Meralgia paraesthetica, left 01/12/2018   Stroke (HCC)    No past surgical history on file. Social History   Tobacco Use  Smoking Status Former   Current packs/day: 0.00   Average packs/day: 0.3 packs/day for 30.0 years (9.0 ttl pk-yrs)   Types: Cigarettes   Start date: 40   Quit date: 2019   Years since quitting: 5.8  Smokeless Tobacco Never    Donna Gilmore 11/19/2022, 11:30 AM

## 2022-11-20 NOTE — Progress Notes (Signed)
[facility] PREP Weekly Session  Patient Details  Name: Donna Gilmore MRN: [id number] Date of Birth: 1940-12-15 Age: 82 y.o. PCP: Arnette Felts, FNP  There were no vitals filed for this visit.   [facility] Weekly seesion - 11/20/22 1500       [facility] "PREP" Location   [facility] "PREP" Location [facility]      Weekly Session   Topic Discussed Goal setting and welcome to the program   Fit testing   Classes attended to date 2             Bonnye Fava 11/20/2022, 3:32 PM

## 2022-11-25 ENCOUNTER — Other Ambulatory Visit: Payer: Medicare Other | Admitting: Pharmacist

## 2022-11-25 DIAGNOSIS — Z8673 Personal history of transient ischemic attack (TIA), and cerebral infarction without residual deficits: Secondary | ICD-10-CM

## 2022-11-25 DIAGNOSIS — E1122 Type 2 diabetes mellitus with diabetic chronic kidney disease: Secondary | ICD-10-CM

## 2022-11-25 DIAGNOSIS — I10 Essential (primary) hypertension: Secondary | ICD-10-CM

## 2022-11-25 DIAGNOSIS — I739 Peripheral vascular disease, unspecified: Secondary | ICD-10-CM

## 2022-11-25 DIAGNOSIS — Z8709 Personal history of other diseases of the respiratory system: Secondary | ICD-10-CM | POA: Diagnosis not present

## 2022-11-25 DIAGNOSIS — I1310 Hypertensive heart and chronic kidney disease without heart failure, with stage 1 through stage 4 chronic kidney disease, or unspecified chronic kidney disease: Secondary | ICD-10-CM | POA: Diagnosis not present

## 2022-11-25 DIAGNOSIS — N1832 Chronic kidney disease, stage 3b: Secondary | ICD-10-CM

## 2022-11-25 MED ORDER — EMPAGLIFLOZIN 10 MG PO TABS: 10.0000 mg | ORAL_TABLET | Freq: Every day | ORAL | Status: DC

## 2022-11-25 MED ORDER — DAPAGLIFLOZIN PROPANEDIOL 10 MG PO TABS: 10.0000 mg | ORAL_TABLET | Freq: Every day | ORAL | Status: DC

## 2022-11-25 MED ORDER — BREZTRI AEROSPHERE 160-9-4.8 MCG/ACT IN AERO
2.0000 | INHALATION_SPRAY | Freq: Two times a day (BID) | RESPIRATORY_TRACT | 3 refills | Status: DC
Start: 2022-11-25 — End: 2023-05-06

## 2022-11-25 MED ORDER — EZETIMIBE 10 MG PO TABS: 10.0000 mg | ORAL_TABLET | Freq: Every day | ORAL | 3 refills | Status: DC

## 2022-11-25 NOTE — Patient Instructions (Addendum)
[place],   It was great talking to you today!  Take Jardiance 10 mg one tablet daily, this REPLACES Rybelsus. We are applying to get a cousin medication, Farxiga 10 mg daily.   Check your blood sugars twice daily:  1) Fasting, first thing in the morning before breakfast and  2) 2 hours after your largest meal.   For a goal A1c of less than 7%, goal fasting readings are less than 130 and goal 2 hour after meal readings are less than 180.    AM:  - Atorvastatin (cholesterol) - Ezetimibe (cholesterol) - Jardiance (blood sugars- will switch to [name]) - Clopidogrel (blood thinner) - Gabapentin (pain) - Olmesartan/hydrochlorothiazide (blood pressure) - Pantoprazole (acid reflux/swallowing) - Vitamin D, Vitamin C  PM:  - Donepezil (memory) - Gabapentin (pain) - Levocetirizine (allergies)  Take Breztri two puffs TWICE daily for breathing. This comes for free from the manufacturer, I sent a refill today. We will get this for you for free next year as well.   Call me with any questions!  Catie Eppie Gibson, PharmD, BCACP, CPP Clinical Pharmacist [facility] [phone]

## 2022-11-25 NOTE — Progress Notes (Signed)
11/25/2022 Name: Donna Gilmore MRN: [id number] DOB: Dec 15, 1940  Chief Complaint  Patient presents with   Medication Management   Hypertension   Diabetes    Donna Gilmore is a 82 y.o. year old female who was referred for medication management by their primary care provider, Arnette Felts, FNP. They presented for a face to face visit today.   They were referred to the pharmacist by their PCP for assistance in managing diabetes and complex medication management    Subjective:  Care Team: Primary Care Provider: Arnette Felts, FNP ; Next Scheduled Visit: 02/12/22  Medication Access/Adherence  Current Pharmacy:  [id number] - [address], [place] - [address] [facility] [address] [place] [address] Phone: [phone] Fax: [phone]  [facility]) - [place], [place] - [address] [facility] [address] [place] [address] [place] [address] Phone: [phone] Fax: [phone]  [facility] - [place], [place] - [address] [place] [address] Phone: [phone] Fax: [phone]  [facility] - [place], [place] - [address] [place] [address] [place] [address] Phone: [phone] Fax: [phone]   Patient reports affordability concerns with their medications: Yes  Patient reports access/transportation concerns to their pharmacy: No  Patient reports adherence concerns with their medications:  No     Diabetes:  Current medications: Rybelsus 7 mg daily  Does report issues with constipation, concern with not having an appetite.   Current glucose readings: fastings and pre-prandial: ~110-150s  Patient denies hypoglycemic s/sx including dizziness, shakiness, sweating.  Previously applied for [employer] assistance for Rybelsus. No documentation that an order arrived from [employer].   Hypertension:  Current medications:  olmesartan/hydrochlorothiazide 40/12.5 mg daily  Hyperlipidemia/ASCVD Risk Reduction  Current lipid lowering medications: atorvastatin 80 mg daily, ezetimibe 10 mg daily - this order is expired  Antiplatelet regimen: clopidogrel 75 mg daily  Memory: Current medications: donepezil 10 mg QPM - notes severe dreams and insomnia. Does note periodic dizziness at baseline. Notes it is not positional and she does not derive benefit from meclizine.   Pain, Back: Current medications: gabapentin 100 mg three times daily - report she is just taking twice daily. Does not feel like this providers significant benefit.   Swallowing Concerns: Current medications: prescribed pantoprazole 40 mg daily, but notes she has not been taking as she does not know what it is for.    Objective:  Lab Results  Component Value Date   HGBA1C 7.0 (H) 10/14/2022    Lab Results  Component Value Date   CREATININE 1.68 (H) 11/02/2022   BUN 14 11/02/2022   NA 138 11/02/2022   K 4.7 11/02/2022   CL 103 11/02/2022   CO2 21 (L) 11/02/2022    Lab Results  Component Value Date   CHOL 187 10/14/2022   HDL 49 10/14/2022   LDLCALC 75 10/14/2022   TRIG 398 (H) 10/14/2022   CHOLHDL 3.8 10/14/2022    Medications Reviewed Today     Reviewed by Alden Hipp, RPH-CPP (Pharmacist) on 11/25/22 at 1539  Med List Status: <None>   Medication Order Taking? Sig Documenting Provider Last Dose Status Informant  0.9 %  sodium chloride infusion [id number]   Hilarie Fredrickson, MD  Active   acetaminophen (TYLENOL) 500 MG tablet [id number] Yes Take 1 tablet (500 mg total) by mouth every 6 (six) hours as needed. Demaris Callander  O, MD Taking Active   atorvastatin (LIPITOR) 80 MG tablet [id number] Yes TAKE 1 TABLET BY MOUTH ONCE  DAILY Arnette Felts, FNP Taking Active   azelastine (OPTIVAR) 0.05 % ophthalmic solution [id number] Yes Place 1 drop into both eyes 2 (two) times daily. Marcelyn Bruins, MD Taking Active    Budeson-Glycopyrrol-Formoterol [facility] AEROSPHERE) 160-9-4.8 MCG/ACT Sandrea Matte [id number] Yes USE 2 INHALATIONS BY MOUTH  DAILY Arnette Felts, FNP Taking Active   cholecalciferol (VITAMIN D3) 25 MCG (1000 UNIT) tablet [id number]  Take 1,000 Units by mouth daily. [provider]  Active   clopidogrel (PLAVIX) 75 MG tablet [id number] Yes TAKE 1 TABLET BY MOUTH DAILY Arnette Felts, FNP Taking Active   desonide (DESOWEN) 0.05 % ointment [id number] Yes Apply 1 Application topically 2 (two) times daily. for itchy rash Marcelyn Bruins, MD Taking Active   diclofenac Sodium (VOLTAREN) 1 % GEL [id number] No Apply topically 4 (four) times daily.  Patient not taking: Reported on 11/25/2022   [provider] Not Taking Active   donepezil (ARICEPT) 10 MG tablet [id number] Yes Take 1 tablet (10 mg total) by mouth at bedtime. Arnette Felts, FNP Taking Active   ezetimibe (ZETIA) 10 MG tablet [id number] No Take 1 tablet (10 mg total) by mouth daily.  Patient not taking: Reported on 11/25/2022   Chrystie Nose, MD Not Taking Active   gabapentin (NEURONTIN) 100 MG capsule [id number] Yes TAKE 1 CAPSULE BY MOUTH 3 TIMES  DAILY AS NEEDED Arnette Felts, FNP Taking Active   levocetirizine (XYZAL) 5 MG tablet [id number] Yes Take 1 tablet (5 mg total) by mouth every evening. Marcelyn Bruins, MD Taking Active   meclizine (ANTIVERT) 25 MG tablet [id number] No TAKE 1 TABLET BY MOUTH 2  TIMES EVERY DAY AS NEEDED  Patient not taking: Reported on 11/25/2022   Arnette Felts, FNP Not Taking Active   olmesartan-hydrochlorothiazide [facility] HCT) 40-12.5 MG tablet [id number] Yes TAKE 1 TABLET BY MOUTH DAILY Arnette Felts, FNP Taking Active   Olopatadine HCl 0.6 % SOLN [id number] Yes 2 sprays each nostril 2 times daily as needed for runny nose/ itch. Marcelyn Bruins, MD Taking Active   [facility] ULTRA test strip [id number] Yes USE WITH METER TO CHECK BLOOD  SUGAR BEFORE BREAKFAST AND  BEFORE Darcel Smalling, Lolita Cram, FNP Taking Active   pantoprazole (PROTONIX) 40 MG tablet [id number]  Take 1 tablet (40 mg total) by mouth daily. Ellender Hose, NP  Active   polyethylene glycol (MIRALAX / GLYCOLAX) 17 g packet [id number] No Take 17 g by mouth 2 (two) times daily.  Patient not taking: Reported on 11/25/2022   Doree Albee, PA-C Not Taking Active   Semaglutide [facility]) 7 MG TABS [id number] Yes Take 1 tablet (7 mg total) by mouth daily. Arnette Felts, FNP Taking Active   vitamin C (ASCORBIC ACID) 500 MG tablet [id number] Yes Take 500 mg by mouth daily. [provider] Taking Active Self  VITAMIN D PO [id number] Yes Take 1 tablet by mouth daily. [provider] Taking Active               Assessment/Plan:   Diabetes: - Currently uncontrolled and reporting some side effects of GLP1 therapy, including constipation and decreased appetite. Also could benefit from SGLT2 therapy due to CKD.  - Recommend to stop Rybelsus, start SGLT2. Samples of Jardiance 10 mg daily given today (4 boxes). Completed application for Farxiga assistance and patient will call me back with income information. Will collaborate with PCP to place  order. Reviewed that she will complete samples of Jardiance 10 mg daily and start Farxiga 10 mg daily when received from patient assistance.  - Reviewed long term cardiovascular and renal outcomes of uncontrolled blood sugar - Reviewed goal A1c, goal fasting, and goal 2 hour post prandial glucose. Patient will adjust glucose check times.  - Also discussed non pharmacologic options for constipation. Patient reported hard stool, also discussed stimulant + stool softener, such as senna+ docusate  Hypertension: - Currently controlled - Recommend to continue current regimen   Hyperlipidemia/ASCVD Risk Reduction: - Currently uncontrolled.  - Recommend to restart ezetimibe, continue atorvastatin. Patient verbalizes understanding.   Memory: - Uncontrolled per  patient report. Insomnia and negative dreams could be related to donepezil. Consider reducing dose to 5 mg and taking in the morning. Will discuss with PCP.   Pain, Back: - Uncontrolled. Will discuss options to augment gabapentin moving forward.   Swallowing Concerns: - Uncontrolled. Encouraged patient to take once daily in the morning to help with swallowing concerns. Patient verbalizes understanding   Follow Up Plan: in person in 4 weeks  Catie TClearance Coots, PharmD, BCACP, CPP Clinical Pharmacist [facility] [phone]

## 2022-11-26 ENCOUNTER — Ambulatory Visit (INDEPENDENT_AMBULATORY_CARE_PROVIDER_SITE_OTHER): Payer: Medicare Other | Admitting: Podiatry

## 2022-11-26 DIAGNOSIS — Z91199 Patient's noncompliance with other medical treatment and regimen due to unspecified reason: Secondary | ICD-10-CM

## 2022-11-26 NOTE — Progress Notes (Signed)
1. No-show for appointment     

## 2022-12-02 ENCOUNTER — Other Ambulatory Visit (INDEPENDENT_AMBULATORY_CARE_PROVIDER_SITE_OTHER): Payer: Medicare Other | Admitting: Pharmacist

## 2022-12-02 DIAGNOSIS — J4489 Other specified chronic obstructive pulmonary disease: Secondary | ICD-10-CM

## 2022-12-02 DIAGNOSIS — E1122 Type 2 diabetes mellitus with diabetic chronic kidney disease: Secondary | ICD-10-CM

## 2022-12-02 DIAGNOSIS — N183 Chronic kidney disease, stage 3 unspecified: Secondary | ICD-10-CM

## 2022-12-02 NOTE — Progress Notes (Signed)
Care Coordination Call  Received patient and provider portions of Breztri and [name] applications. Sent to CPhT team to submit to [employer].   Previously approved for [employer] for 2024. New application for Farxiga for 2024/2025.   Catie Eppie Gibson, PharmD, BCACP, CPP Clinical Pharmacist [facility] [phone]

## 2022-12-04 DIAGNOSIS — D631 Anemia in chronic kidney disease: Secondary | ICD-10-CM | POA: Diagnosis not present

## 2022-12-04 DIAGNOSIS — N1832 Chronic kidney disease, stage 3b: Secondary | ICD-10-CM | POA: Diagnosis not present

## 2022-12-04 DIAGNOSIS — N189 Chronic kidney disease, unspecified: Secondary | ICD-10-CM | POA: Diagnosis not present

## 2022-12-04 DIAGNOSIS — N2881 Hypertrophy of kidney: Secondary | ICD-10-CM | POA: Diagnosis not present

## 2022-12-04 DIAGNOSIS — I129 Hypertensive chronic kidney disease with stage 1 through stage 4 chronic kidney disease, or unspecified chronic kidney disease: Secondary | ICD-10-CM | POA: Diagnosis not present

## 2022-12-04 DIAGNOSIS — N2581 Secondary hyperparathyroidism of renal origin: Secondary | ICD-10-CM | POA: Diagnosis not present

## 2022-12-04 DIAGNOSIS — E1122 Type 2 diabetes mellitus with diabetic chronic kidney disease: Secondary | ICD-10-CM | POA: Diagnosis not present

## 2022-12-04 LAB — LAB REPORT - SCANNED
Creatinine, POC: 86.8 mg/dL
EGFR: 30

## 2022-12-05 ENCOUNTER — Other Ambulatory Visit: Payer: Self-pay

## 2022-12-05 ENCOUNTER — Ambulatory Visit: Payer: Medicare Other | Admitting: Allergy

## 2022-12-05 ENCOUNTER — Encounter: Payer: Self-pay | Admitting: Allergy

## 2022-12-05 VITALS — BP 114/62 | HR 69 | Temp 97.6°F | Resp 20 | Ht 63.5 in | Wt 185.1 lb

## 2022-12-05 DIAGNOSIS — H01134 Eczematous dermatitis of left upper eyelid: Secondary | ICD-10-CM

## 2022-12-05 DIAGNOSIS — H1013 Acute atopic conjunctivitis, bilateral: Secondary | ICD-10-CM | POA: Diagnosis not present

## 2022-12-05 DIAGNOSIS — H01132 Eczematous dermatitis of right lower eyelid: Secondary | ICD-10-CM

## 2022-12-05 DIAGNOSIS — J3089 Other allergic rhinitis: Secondary | ICD-10-CM | POA: Diagnosis not present

## 2022-12-05 DIAGNOSIS — J302 Other seasonal allergic rhinitis: Secondary | ICD-10-CM | POA: Diagnosis not present

## 2022-12-05 DIAGNOSIS — H01135 Eczematous dermatitis of left lower eyelid: Secondary | ICD-10-CM

## 2022-12-05 DIAGNOSIS — H01131 Eczematous dermatitis of right upper eyelid: Secondary | ICD-10-CM

## 2022-12-05 MED ORDER — LORATADINE 10 MG PO TABS: 10.0000 mg | ORAL_TABLET | Freq: Every day | ORAL | 6 refills | Status: DC

## 2022-12-05 MED ORDER — OLOPATADINE HCL 0.6 % NA SOLN: NASAL | 6 refills | Status: DC

## 2022-12-05 MED ORDER — PIMECROLIMUS 1 % EX CREA: TOPICAL_CREAM | Freq: Two times a day (BID) | CUTANEOUS | 6 refills | Status: DC

## 2022-12-05 NOTE — Progress Notes (Signed)
Follow-up Note  RE: Donna Gilmore MRN: [id number] DOB: [birth year] Date of Office Visit: 12/05/2022   History of present illness: Donna Gilmore is a 82 y.o. female presenting today for follow-up of allergic rhinitis with conjunctivitis and eyelid dermatitis.  She was last seen in the office on 06/06/2022 by myself.  She presents today with her son.  She states she continues to have itching nasal bridge especially.  She does not recall having issues with her eyelids which she has reported at previous visits having itching and discoloration of the eyelid.  She reports a lack of energy, which she attributes to allergies. The patient has previously been prescribed Elidel ointment for the eyelid rash, but she does not recall using it. She also reports itchiness and excessive drainage from the nose, which has not been effectively managed with previous nasal sprays, including Dymista and Atrovent.  She does not recall getting the pack today's nasal spray from the pharmacy after it was recommended by myself last visit.  She has been taking an antihistamine, Xyzal, for allergy symptoms but recently switched back to Claritin, which she has been taking twice daily for about a week. She reports some improvement with Claritin.  Review of systems: 10pt ROS negative unless noted above in HPI  All other systems negative unless noted above in HPI  Past medical/social/surgical/family history have been reviewed and are unchanged unless specifically indicated below.  No changes  Medication List: Current Outpatient Medications  Medication Sig Dispense Refill   acetaminophen (TYLENOL) 500 MG tablet Take 1 tablet (500 mg total) by mouth every 6 (six) hours as needed. 30 tablet 0   atorvastatin (LIPITOR) 80 MG tablet TAKE 1 TABLET BY MOUTH ONCE  DAILY 100 tablet 2   Budeson-Glycopyrrol-Formoterol (BREZTRI AEROSPHERE) 160-9-4.8 MCG/ACT AERO Inhale 2 puffs into the lungs 2 (two) times daily. 32.1 g 3    cholecalciferol (VITAMIN D3) 25 MCG (1000 UNIT) tablet Take 1,000 Units by mouth daily.     clopidogrel (PLAVIX) 75 MG tablet TAKE 1 TABLET BY MOUTH DAILY 100 tablet 2   dapagliflozin propanediol (FARXIGA) 10 MG TABS tablet Take 1 tablet (10 mg total) by mouth daily before breakfast.     donepezil (ARICEPT) 10 MG tablet Take 1 tablet (10 mg total) by mouth at bedtime. 100 tablet 2   empagliflozin (JARDIANCE) 10 MG TABS tablet Take 1 tablet (10 mg total) by mouth daily before breakfast.     ezetimibe (ZETIA) 10 MG tablet Take 1 tablet (10 mg total) by mouth daily. 100 tablet 3   gabapentin (NEURONTIN) 100 MG capsule TAKE 1 CAPSULE BY MOUTH 3 TIMES  DAILY AS NEEDED 300 capsule 2   levocetirizine (XYZAL) 5 MG tablet Take 1 tablet (5 mg total) by mouth every evening. 30 tablet 11   loratadine (CLARITIN) 10 MG tablet Take 1 tablet (10 mg total) by mouth daily. 1-2 tabs a day to help control allergy symptoms and itch 30 tablet 6   olmesartan (BENICAR) 20 MG tablet Take 20 mg by mouth daily.     ONETOUCH ULTRA test strip USE WITH METER TO CHECK BLOOD  SUGAR BEFORE BREAKFAST AND  BEFORE DINNER 200 strip 2   pantoprazole (PROTONIX) 40 MG tablet Take 1 tablet (40 mg total) by mouth daily. 30 tablet 6   pimecrolimus (ELIDEL) 1 % cream Apply topically 2 (two) times daily. apply thin layer to the skin as needed twice a day for itching or rash 30 g 6  vitamin C (ASCORBIC ACID) 500 MG tablet Take 500 mg by mouth daily.     VITAMIN D PO Take 1 tablet by mouth daily.     azelastine (OPTIVAR) 0.05 % ophthalmic solution Place 1 drop into both eyes 2 (two) times daily. 6 mL 5   desonide (DESOWEN) 0.05 % ointment Apply 1 Application topically 2 (two) times daily. for itchy rash (Patient not taking: Reported on 12/05/2022) 60 g 5   diclofenac Sodium (VOLTAREN) 1 % GEL Apply topically 4 (four) times daily. (Patient not taking: Reported on 11/25/2022)     meclizine (ANTIVERT) 25 MG tablet TAKE 1 TABLET BY MOUTH 2  TIMES  EVERY DAY AS NEEDED (Patient not taking: Reported on 11/25/2022) 180 tablet 1   olmesartan-hydrochlorothiazide (BENICAR HCT) 40-12.5 MG tablet TAKE 1 TABLET BY MOUTH DAILY (Patient not taking: Reported on 12/05/2022) 100 tablet 2   Olopatadine HCl 0.6 % SOLN 2 sprays each nostril 2 times daily as needed for runny nose/ itch. 30.5 g 6   polyethylene glycol (MIRALAX / GLYCOLAX) 17 g packet Take 17 g by mouth 2 (two) times daily. (Patient not taking: Reported on 11/25/2022) 60 packet 3   Current Facility-Administered Medications  Medication Dose Route Frequency Provider Last Rate Last Admin   0.9 %  sodium chloride infusion  500 mL Intravenous Once Hilarie Fredrickson, MD         Known medication allergies: No Known Allergies   Physical examination: Blood pressure 114/62, pulse 69, temperature 97.6 F (36.4 C), temperature source Temporal, resp. rate 20, height 5' 3.5" (1.613 m), weight 185 lb 1.6 oz (84 kg), SpO2 96%.  General: Alert, interactive, in no acute distress. HEENT: PERRLA, TMs pearly gray, turbinates non-edematous without discharge, post-pharynx non erythematous. Neck: Supple without lymphadenopathy. Lungs: Clear to auscultation without wheezing, rhonchi or rales. {no increased work of breathing. CV: Normal S1, S2 without murmurs. Abdomen: Nondistended, nontender. Skin: Hyperpigmented upper eyelids with mild scaling . Extremities:  No clubbing, cyanosis or edema. Neuro:   Grossly intact.  Diagnositics/Labs: None today  Assessment and plan: Allergic rhinitis with conjunctivitis Eyelid/facial dermatitis   - Continue avoidance measures for dust mites and cockroach.  - Air purifiers help to purify the air you breathe in and reduce your allergen load in the home.  - use Patanase (Olopatadine) nasal spray 2 sprays each nostril twice a day as needed for runny/itchy nose - use Claritin 1-2 tabs a day to help control allergy symptoms and itch.  If using Claritin do not take Xyzal -  for itching/rash of the eyelid, nose or face use Elidel (Pimecrolimus) ointment apply thin layer to the skin as needed twice a day for itching or rash  Follow-up in 6 months or sooner if needed  I appreciate the opportunity to take part in Cristal's care. Please do not hesitate to contact me with questions.  Sincerely,   Margo Aye, MD Allergy/Immunology [facility]

## 2022-12-05 NOTE — Patient Instructions (Addendum)
-   Continue avoidance measures for dust mites and cockroach.  - Air purifiers help to purify the air you breathe in and reduce your allergen load in the home.  - use Patanase (Olopatadine) nasal spray 2 sprays each nostril twice a day as needed for runny/itchy nose - use Claritin 1-2 tabs a day to help control allergy symptoms and itch.  If using Claritin do not take Xyzal - for itching/rash of the eyelid, nose or face use Elidel (Pimecrolimus) ointment apply thin layer to the skin as needed twice a day for itching or rash  Follow-up in 6 months or sooner if needed

## 2022-12-06 ENCOUNTER — Telehealth: Payer: Self-pay

## 2022-12-06 NOTE — Telephone Encounter (Signed)
*  Asthma/Allergy  Pharmacy Patient Advocate Encounter   Received notification from CoverMyMeds that prior authorization for Pimecrolimus 1% cream  is required/requested.   Insurance verification completed.   The patient is insured through [facility] .   Per test claim: PA required; PA submitted to above mentioned insurance via CoverMyMeds Key/confirmation #/EOC [facility] Status is pending

## 2022-12-09 ENCOUNTER — Ambulatory Visit: Payer: Medicare Other | Admitting: Nurse Practitioner

## 2022-12-11 NOTE — Telephone Encounter (Signed)
Pharmacy Patient Advocate Encounter  Received notification from [facility] that Prior Authorization for Pimecrolimus 1% cream has been DENIED.  Full denial letter will be uploaded to the media tab. See denial reason below.  Medication authorization requires the following:  (1) You need to try one (1) of the following covered drugs:  (A) Ala-Cort 2.5% or hydrocortisone 2.5% cream (B) Augmented betamethasone 0.05% (C) Fluocinonide 0.05% (D) Hydrocortisone 2.5% ointment (2) Your doctor needs to give [place] specific medical reasons why you cannot take the covered drug(s)  PA #/Case ID/Reference #: [id number]

## 2022-12-12 ENCOUNTER — Other Ambulatory Visit (INDEPENDENT_AMBULATORY_CARE_PROVIDER_SITE_OTHER): Payer: Medicare Other | Admitting: Pharmacist

## 2022-12-12 DIAGNOSIS — N1832 Chronic kidney disease, stage 3b: Secondary | ICD-10-CM

## 2022-12-12 DIAGNOSIS — E1122 Type 2 diabetes mellitus with diabetic chronic kidney disease: Secondary | ICD-10-CM

## 2022-12-12 NOTE — Progress Notes (Signed)
Care Coordination Call  Spoke with son, Oswaldo Done. They note they are almost out of Jardiance samples provided previously.   Will collaborate with team to provide Farxiga 10 mg samples while waiting on [place] patient assistance.   Catie Eppie Gibson, PharmD, BCACP, CPP Clinical Pharmacist [facility] [phone]

## 2022-12-16 ENCOUNTER — Telehealth: Payer: Self-pay

## 2022-12-16 MED ORDER — OLMESARTAN MEDOXOMIL 20 MG PO TABS: 20.0000 mg | ORAL_TABLET | Freq: Every day | ORAL | 1 refills | Status: DC

## 2022-12-16 NOTE — Telephone Encounter (Signed)
refill 

## 2022-12-20 NOTE — Telephone Encounter (Signed)
Donna Gilmore,  Per Dr. Delorse Lek the alternatives were not sufficient for application to the eyelid. Did the denial provide an option for an appeal?

## 2022-12-23 ENCOUNTER — Ambulatory Visit: Payer: Self-pay

## 2022-12-23 NOTE — Telephone Encounter (Signed)
Information has been sent to clinical pharmacist for appeals review. It may take 5-7 days to prepare the necessary documentation to request the appeal from the insurance.

## 2022-12-24 NOTE — Telephone Encounter (Signed)
Appeal letter and all pertinent documentation has been submitted to the insurance via fax number [phone] at 3:12PM 12/24/2022. Please be advised that most companies may take 30 days to make a decision. Will advise when response is received.  Dellie Burns, PharmD Clinical Pharmacist [facility]  Direct Dial: [phone]

## 2022-12-25 DIAGNOSIS — I129 Hypertensive chronic kidney disease with stage 1 through stage 4 chronic kidney disease, or unspecified chronic kidney disease: Secondary | ICD-10-CM | POA: Diagnosis not present

## 2022-12-30 ENCOUNTER — Other Ambulatory Visit: Payer: Medicare Other | Admitting: Pharmacist

## 2022-12-30 ENCOUNTER — Other Ambulatory Visit: Payer: Self-pay | Admitting: Nurse Practitioner

## 2022-12-30 DIAGNOSIS — E1122 Type 2 diabetes mellitus with diabetic chronic kidney disease: Secondary | ICD-10-CM

## 2022-12-30 DIAGNOSIS — R202 Paresthesia of skin: Secondary | ICD-10-CM

## 2022-12-30 NOTE — Progress Notes (Signed)
Care Coordination Call   Contacted patient's son, Oswaldo Done. He reports she is tolerating [name] well, has ~ 2 weeks left of samples. Per chart review, appears tech team has not submitted Designer, fashion/clothing and [name] application. Submitted today. Will follow up.   Catie Eppie Gibson, PharmD, BCACP, CPP Clinical Pharmacist [facility] [phone]

## 2023-01-01 ENCOUNTER — Other Ambulatory Visit: Payer: Self-pay | Admitting: Pharmacist

## 2023-01-01 ENCOUNTER — Telehealth: Payer: Self-pay

## 2023-01-01 NOTE — Telephone Encounter (Signed)
PAP: Patient assistance application for Markus Daft and [name] has been approved by PAP Companies: [place] from 12/30/2022 to 01/28/2024. Medication should be delivered to PAP Delivery: Home For further shipping updates, please contact [employer] ([place]) at [phone] Pt ID is: [id number] Approval indexed to pt media

## 2023-01-01 NOTE — Progress Notes (Signed)
Pharmacy Medication Assistance Program Note    01/01/2023  Patient ID: Donna Gilmore, female   DOB: [birth year], 82 y.o.   MRN: [id number]     01/01/2023  Outreach Medication One  Initial Outreach Date (Medication One) 12/30/2022  Manufacturer Medication One [employer]  [employer] [facility] Farxiga  Dose of Farxiga 10 mg  Type of Assistance Manufacturer Assistance  Date Application Sent to Patient 12/30/2022  Application Items Requested Application  Date Application Sent to Prescriber 12/30/2022  Name of Prescriber Arnette Felts  Date Application Received From Patient 12/30/2022  Application Items Received From Patient Application  Date Application Received From Provider 12/30/2022  Date Application Submitted to Manufacturer 12/30/2022  Method Application Sent to Manufacturer Fax  Patient Assistance Determination Approved  Approval Start Date 12/30/2022  Approval End Date 01/28/2024  Patient Notification Method Telephone Call          01/01/2023  Outreach Medication Two  Initial Outreach Date (Medication Two) 12/30/2022  Manufacturer Medication Two [employer]  [employer] [facility] [place]  Type of Radiographer, therapeutic Assistance  Date Application Sent to Patient 12/30/2022  Application Items Requested Application  Date Application Sent to Prescriber 12/30/2022  Name of Prescriber Dorothyann Peng  Date Application Received From Provider 12/30/2022  Method Application Sent to Manufacturer Fax  Date Application Submitted to Manufacturer 12/30/2022  Patient Assistance Determination Approved  Approval Start Date 01/29/2023  Patient Notification Method Telephone Call      Approved for [name] and [name] notified. They will likely run out of sample between now and the arrival of the first [name] order, will prepare sample.   Catie Eppie Gibson, PharmD, BCACP, CPP Clinical Pharmacist [facility] [phone]

## 2023-01-16 ENCOUNTER — Other Ambulatory Visit (HOSPITAL_COMMUNITY): Payer: Self-pay

## 2023-01-16 NOTE — Telephone Encounter (Signed)
Received notification from [employer] the appeal for pimecrolimus has been approved.

## 2023-01-20 NOTE — Telephone Encounter (Signed)
Spoke with patient, informed her that the Pimecrolimus has been approved. Patient verbalized understanding. Pharmacy has been made aware.

## 2023-02-05 ENCOUNTER — Other Ambulatory Visit: Payer: Self-pay

## 2023-02-09 ENCOUNTER — Encounter: Payer: Self-pay | Admitting: Nurse Practitioner

## 2023-02-13 ENCOUNTER — Ambulatory Visit: Payer: Medicare Other | Admitting: Nurse Practitioner

## 2023-02-13 ENCOUNTER — Telehealth: Payer: Self-pay | Admitting: *Deleted

## 2023-02-13 ENCOUNTER — Encounter: Payer: Self-pay | Admitting: Nurse Practitioner

## 2023-02-13 ENCOUNTER — Ambulatory Visit: Payer: Self-pay

## 2023-02-13 VITALS — BP 130/80 | HR 68 | Temp 97.8°F | Ht 63.0 in | Wt 190.4 lb

## 2023-02-13 DIAGNOSIS — I129 Hypertensive chronic kidney disease with stage 1 through stage 4 chronic kidney disease, or unspecified chronic kidney disease: Secondary | ICD-10-CM | POA: Diagnosis not present

## 2023-02-13 DIAGNOSIS — I10 Essential (primary) hypertension: Secondary | ICD-10-CM

## 2023-02-13 DIAGNOSIS — Z6833 Body mass index (BMI) 33.0-33.9, adult: Secondary | ICD-10-CM

## 2023-02-13 DIAGNOSIS — E66811 Obesity, class 1: Secondary | ICD-10-CM

## 2023-02-13 DIAGNOSIS — K5909 Other constipation: Secondary | ICD-10-CM | POA: Diagnosis not present

## 2023-02-13 DIAGNOSIS — Z2821 Immunization not carried out because of patient refusal: Secondary | ICD-10-CM | POA: Diagnosis not present

## 2023-02-13 DIAGNOSIS — N1832 Chronic kidney disease, stage 3b: Secondary | ICD-10-CM

## 2023-02-13 DIAGNOSIS — E1122 Type 2 diabetes mellitus with diabetic chronic kidney disease: Secondary | ICD-10-CM

## 2023-02-13 DIAGNOSIS — I739 Peripheral vascular disease, unspecified: Secondary | ICD-10-CM

## 2023-02-13 DIAGNOSIS — Z741 Need for assistance with personal care: Secondary | ICD-10-CM

## 2023-02-13 DIAGNOSIS — E6609 Other obesity due to excess calories: Secondary | ICD-10-CM

## 2023-02-13 DIAGNOSIS — N183 Chronic kidney disease, stage 3 unspecified: Secondary | ICD-10-CM

## 2023-02-13 DIAGNOSIS — R5383 Other fatigue: Secondary | ICD-10-CM | POA: Diagnosis not present

## 2023-02-13 DIAGNOSIS — J4489 Other specified chronic obstructive pulmonary disease: Secondary | ICD-10-CM

## 2023-02-13 NOTE — Patient Outreach (Signed)
  Care Coordination   Follow Up Visit Note   02/13/2023 Name: Donna Gilmore MRN: [id number] DOB: [birth year]  Donna Gilmore is a 83 y.o. year old female who sees Arnette Felts, FNP for primary care. I spoke with  [name] during an in person visit today.  What matters to the patients health and wellness today?  Patient would like to receive caregiver assistance at home for custodial needs.     Goals Addressed             This Visit's Progress    RN Care Coordination Activities: further follow up is needed       Care Coordination Interventions: Completed face to face visit with patient during PCP office visit Determined patient would like to re-engage with nurse care coordinator for help with chronic disease management and care coordination Discussed with PCP and patient's son, patient needs care assistance at home Determined patient was advised by [facility] nurse, reimbursement through [facility] for non-medical care will be provided Discussed patient is working with Donna Gilmore to establish non-medical care 4 hours per day x 3 days weekly Spoke with rep from this agency who advised a PCP referral will need to be faxed, in order to start services and reimburse the patient/son for out of pocket cost, PCP will send the requested referral Collaborated with Remigio Eisenmenger BSW regarding confirmation of reimbursement from [facility] for non-medical care for patient, she will call [employer] to confirm the information provided is correct Sent message to [facility] Guide requesting patient outreach to schedule initial RN CC outreach     Interventions Today    Flowsheet Row Most Recent Value  General Interventions   General Interventions Discussed/Reviewed General Interventions Discussed, General Interventions Reviewed, Level of Care, Communication with  Communication with PCP/Specialists, Social Work  Arnette Felts FNP,  Remigio Eisenmenger BSW]  Level of Care Personal Care Services  Education  Interventions   Education Provided Provided Education          SDOH assessments and interventions completed:  No     Care Coordination Interventions:  Yes, provided   Follow up plan: Patient will be contacted by the care guide team in order to schedule initial nurse care coordination call.   Encounter Outcome:  Patient Visit Completed

## 2023-02-13 NOTE — Progress Notes (Signed)
Complex Care Management Note  Care Guide Note 02/13/2023 Name: Donna Gilmore MRN: [id number] DOB: 1940/11/15  Donna Gilmore is a 83 y.o. year old female who sees Arnette Felts, FNP for primary care. I reached out to Willadean Carol by phone today to offer complex care management services.  [name] was given information about Complex Care Management services today including:   The Complex Care Management services include support from the care team which includes your Nurse Coordinator, Clinical Social Worker, or Pharmacist.  The Complex Care Management team is here to help remove barriers to the health concerns and goals most important to you. Complex Care Management services are voluntary, and the patient may decline or stop services at any time by request to their care team member.   Complex Care Management Consent Status: Patient agreed to services and verbal consent obtained.   Follow up plan:  Telephone appointment with complex care management team member scheduled for:  1/27  Encounter Outcome:  Patient Scheduled  Gwenevere Ghazi  [facility]  [facility], [facility] Guide  Direct Dial: [phone]  Fax [phone]

## 2023-02-13 NOTE — Progress Notes (Signed)
Madelaine Bhat, CMA,acting as a Neurosurgeon for Arnette Felts, FNP.,have documented all relevant documentation on the behalf of Arnette Felts, FNP,as directed by  Arnette Felts, FNP while in the presence of Arnette Felts, FNP.  Subjective:  Patient ID: Donna Gilmore , female    DOB: 1940/07/14 , 83 y.o.   MRN: [id number]  Chief Complaint  Patient presents with   Diabetes    HPI  Patient presents today for a bp and dm follow up, Patient reports compliance with medication. Patient denies any chest pain, SOB, or headaches.   Patient reports she is having fatigue for a while (about 2/3 months), patient reports it is hard for her to get around because she is so tired. Patients son (vincent) reports she has heavy breathing as well starting about 2/3 months ago. Patient reports her blood sugars are up and down.   She had a blood sugar 201 at 10am and had coffee no sugar added. She does not sleep well. She has bad dreams which keeps her up at night. She will snack on apples, grapes, bananas, peanut butter.   She reports she is having more issues with the [name] since having the change  The patient needs assistance with her ADLs. She is having difficulty with cooking, cleaning and bathing. She also needs reminding of her medications.      Past Medical History:  Diagnosis Date   Acute left-sided low back pain with left-sided sciatica 05/04/2018   Benign paroxysmal positional vertigo 08/26/2012   Chest pain 09/21/2012   CVA (cerebral infarction) 08/25/2012   Diabetes mellitus without complication (HCC)    Dyslipidemia 08/25/2012   Essential hypertension, benign 08/25/2012   Hyperlipidemia    Hypertension    Meralgia paraesthetica, left 01/12/2018   Stroke [facility])      Family History  Problem Relation Age of Onset   Kidney failure Mother    Diabetes Mother    Alzheimer's disease Mother    Alzheimer's disease Father    Prostate cancer Father    Diabetes Maternal Grandmother    Diabetes  Maternal Grandfather    Alzheimer's disease Paternal Grandmother    Alzheimer's disease Paternal Grandfather    Prostate cancer Paternal Uncle      Current Outpatient Medications:    atorvastatin (LIPITOR) 80 MG tablet, TAKE 1 TABLET BY MOUTH ONCE  DAILY, Disp: 100 tablet, Rfl: 2   cholecalciferol (VITAMIN D3) 25 MCG (1000 UNIT) tablet, Take 1,000 Units by mouth daily., Disp: , Rfl:    clopidogrel (PLAVIX) 75 MG tablet, TAKE 1 TABLET BY MOUTH DAILY, Disp: 100 tablet, Rfl: 2   dapagliflozin propanediol (FARXIGA) 10 MG TABS tablet, Take 1 tablet (10 mg total) by mouth daily before breakfast., Disp: , Rfl:    donepezil (ARICEPT) 10 MG tablet, Take 1 tablet (10 mg total) by mouth at bedtime., Disp: 100 tablet, Rfl: 2   ezetimibe (ZETIA) 10 MG tablet, Take 1 tablet (10 mg total) by mouth daily., Disp: 100 tablet, Rfl: 3   gabapentin (NEURONTIN) 100 MG capsule, TAKE 1 CAPSULE BY MOUTH 3 TIMES  DAILY AS NEEDED, Disp: 300 capsule, Rfl: 2   levocetirizine (XYZAL) 5 MG tablet, Take 1 tablet (5 mg total) by mouth every evening., Disp: 30 tablet, Rfl: 11   loratadine (CLARITIN) 10 MG tablet, Take 1 tablet (10 mg total) by mouth daily. 1-2 tabs a day to help control allergy symptoms and itch, Disp: 30 tablet, Rfl: 6   olmesartan (BENICAR) 20 MG tablet, Take 1  tablet (20 mg total) by mouth daily., Disp: 90 tablet, Rfl: 1   Olopatadine HCl 0.6 % SOLN, 2 sprays each nostril 2 times daily as needed for runny nose/ itch., Disp: 30.5 g, Rfl: 6   ONETOUCH ULTRA test strip, USE WITH METER TO CHECK BLOOD  SUGAR BEFORE BREAKFAST AND  BEFORE DINNER, Disp: 200 strip, Rfl: 2   pantoprazole (PROTONIX) 40 MG tablet, Take 1 tablet (40 mg total) by mouth daily., Disp: 30 tablet, Rfl: 6   vitamin C (ASCORBIC ACID) 500 MG tablet, Take 500 mg by mouth daily., Disp: , Rfl:    VITAMIN D PO, Take 1 tablet by mouth daily., Disp: , Rfl:    acetaminophen (TYLENOL) 500 MG tablet, Take 1 tablet (500 mg total) by mouth every 6 (six)  hours as needed., Disp: 30 tablet, Rfl: 0   azelastine (OPTIVAR) 0.05 % ophthalmic solution, Place 1 drop into both eyes 2 (two) times daily., Disp: 6 mL, Rfl: 5   Budeson-Glycopyrrol-Formoterol (BREZTRI AEROSPHERE) 160-9-4.8 MCG/ACT AERO, Inhale 2 puffs into the lungs 2 (two) times daily., Disp: 32.1 g, Rfl: 3   desonide (DESOWEN) 0.05 % ointment, Apply 1 Application topically 2 (two) times daily. for itchy rash (Patient not taking: Reported on 12/05/2022), Disp: 60 g, Rfl: 5   diclofenac Sodium (VOLTAREN) 1 % GEL, Apply topically 4 (four) times daily. (Patient not taking: Reported on 11/25/2022), Disp: , Rfl:    meclizine (ANTIVERT) 25 MG tablet, TAKE 1 TABLET BY MOUTH 2  TIMES EVERY DAY AS NEEDED (Patient not taking: Reported on 02/13/2023), Disp: 180 tablet, Rfl: 1   pimecrolimus (ELIDEL) 1 % cream, Apply topically 2 (two) times daily. apply thin layer to the skin as needed twice a day for itching or rash, Disp: 30 g, Rfl: 6   polyethylene glycol (MIRALAX / GLYCOLAX) 17 g packet, Take 17 g by mouth 2 (two) times daily. (Patient not taking: Reported on 02/13/2023), Disp: 60 packet, Rfl: 3  Current Facility-Administered Medications:    0.9 %  sodium chloride infusion, 500 mL, Intravenous, Once, Hilarie Fredrickson, MD   No Known Allergies   Review of Systems  Constitutional: Negative.   HENT:  Positive for hearing loss (wearing hearing aids).   Eyes: Negative.   Respiratory: Negative.    Cardiovascular: Negative.   Gastrointestinal: Negative.   Genitourinary: Negative.   Allergic/Immunologic: Negative.   Neurological: Negative.   Psychiatric/Behavioral: Negative.       Today's Vitals   02/13/23 1036  BP: 130/80  Pulse: 68  Temp: 97.8 F (36.6 C)  TempSrc: Oral  Weight: 190 lb 6.4 oz (86.4 kg)  Height: 5\' 3"  (1.6 m)  PainSc: 0-No pain   Body mass index is 33.73 kg/m.  Wt Readings from Last 3 Encounters:  02/13/23 190 lb 6.4 oz (86.4 kg)  12/05/22 185 lb 1.6 oz (84 kg)  11/18/22 188  lb 6.4 oz (85.5 kg)      Objective:  Physical Exam Vitals reviewed.  Constitutional:      General: She is not in acute distress.    Appearance: Normal appearance. She is obese.  HENT:     Ears:     Comments: Hard of hearing Cardiovascular:     Rate and Rhythm: Normal rate and regular rhythm.     Pulses: Normal pulses.     Heart sounds: Normal heart sounds. No murmur heard. Pulmonary:     Effort: Pulmonary effort is normal. No respiratory distress.     Breath sounds: Normal  breath sounds. No wheezing.  Musculoskeletal:        General: Normal range of motion.  Skin:    General: Skin is warm and dry.     Capillary Refill: Capillary refill takes less than 2 seconds.  Neurological:     General: No focal deficit present.     Mental Status: She is alert and oriented to person, place, and time.     Cranial Nerves: No cranial nerve deficit.     Motor: No weakness.  Psychiatric:        Mood and Affect: Mood normal.        Behavior: Behavior normal.        Thought Content: Thought content normal.        Judgment: Judgment normal.         Assessment And Plan:  Type 2 diabetes mellitus with stage 3b chronic kidney disease, without long-term current use of insulin (HCC) -     Hemoglobin A1c  Essential hypertension Assessment & Plan: Discussed with patient and her son informing that the patient does have type 2 diabetes this is why she is taking the Rybelsus.  Blood pressure is stable at the border.  Advised to focus on lifestyle modifications.  I also had Angel from [facility] to stop in and talk with patient to assist with any needs.   Orders: -     BMP8+eGFR  Other fatigue Assessment & Plan: Unclear on why she has chronic fatigue.  Some of this may be related to deconditioning.  Advised to get up and move around every hour to help with her fatigue.  Orders: -     Iron, TIBC and Ferritin Panel -     Vitamin B12 -     VITAMIN D 25 Hydroxy (Vit-D Deficiency, Fractures) -     CBC  with Differential/Platelet -     Ambulatory referral to Cardiology  Other constipation Assessment & Plan: Discussed the importance of taking a stool softener and using a probiotic daily to help with her constipation.  Advised to stay well-hydrated with water.  Orders: -     DG Abd 2 Views; Future  COVID-19 vaccination declined Assessment & Plan: Declines covid 19 vaccine. Discussed risk of covid 75 and if she changes her mind about the vaccine to call the office. Education has been provided regarding the importance of this vaccine but patient still declined. Advised may receive this vaccine at local pharmacy or Health Dept.or vaccine clinic. Aware to provide a copy of the vaccination record if obtained from local pharmacy or Health Dept.  Encouraged to take multivitamin, vitamin d, vitamin c and zinc to increase immune system. Aware can call office if would like to have vaccine here at office. Verbalized acceptance and understanding.    Class 1 obesity due to excess calories with body mass index (BMI) of 33.0 to 33.9 in adult, unspecified whether serious comorbidity present Assessment & Plan: She is encouraged to strive for BMI less than 30 to decrease cardiac risk. Advised to aim for at least 150 minutes of exercise per week.    Requires daily assistance for activities of daily living (ADL) and comfort needs Assessment & Plan: She is requiring assistance with her ADLs cooking, cleaning, bathing.  Would like to have a nursing assistant to come in to assist. Will fax to the company of their choice.    Benign hypertension with CKD (chronic kidney disease) stage III (HCC)  Type 2 DM with CKD stage 3 and  hypertension (HCC) Assessment & Plan: Discussed with patient and her son informing that the patient does have type 2 diabetes this is why she is taking the Rybelsus.  Blood pressure is stable at the border.  Advised to focus on lifestyle modifications.  I also had Angel from [facility] to stop in  and talk with patient to assist with any needs.      Return for controlled DM check 4 months.  Patient was given opportunity to ask questions. Patient verbalized understanding of the plan and was able to repeat key elements of the plan. All questions were answered to their satisfaction.    Jeanell Sparrow, FNP, have reviewed all documentation for this visit. The documentation on 02/13/23 for the exam, diagnosis, procedures, and orders are all accurate and complete.   IF YOU HAVE BEEN REFERRED TO A SPECIALIST, IT MAY TAKE 1-2 WEEKS TO SCHEDULE/PROCESS THE REFERRAL. IF YOU HAVE NOT HEARD FROM US/SPECIALIST IN TWO WEEKS, PLEASE GIVE [place] A CALL AT [phone].

## 2023-02-13 NOTE — Patient Instructions (Addendum)
Let us know if your blood pressure is above 140/90 or less than 100/60 Check your blood sugar before you eat or at least 2 hours after eating. Do not eat ice cream and cookies especially at bedtime.  The supplement you asked about sugarsix you do not need to take this it can cause kidney damage. I do not recommend taking this supplement.  You can take a holiday off the pantoprazole Gabapentin does not cause fatigue but can cause sleepiness. If you don't have pain in your legs  You can take claritin 1-2 tabs a day and xyzal in the evening per Dr. Delorse Lek

## 2023-02-13 NOTE — Patient Instructions (Signed)
Visit Information  Thank you for taking time to visit with me today. Please don't hesitate to contact me if I can be of assistance to you.   Following are the goals we discussed today:   Goals Addressed             This Visit's Progress    RN Care Coordination Activities: further follow up is needed       Care Coordination Interventions: Completed face to face visit with patient during PCP office visit Determined patient would like to re-engage with nurse care coordinator for help with chronic disease management and care coordination Discussed with PCP and patient's son, patient needs care assistance at home Determined patient was advised by [facility] nurse, reimbursement through [facility] for non-medical care will be provided Discussed patient is working with [employer] to establish non-medical care 4 hours per day x 3 days weekly Spoke with rep from this agency who advised a PCP referral will need to be faxed, in order to start services and reimburse the patient/son for out of pocket cost, PCP will send the requested referral Collaborated with Remigio Eisenmenger BSW regarding confirmation of reimbursement from [facility] for non-medical care for patient, she will call [employer] to confirm the information provided is correct Sent message to [facility] Guide requesting patient outreach to schedule initial RN CC outreach         You will be contacted by the care guide team in order to schedule our next telephone appointment.   Please call the care guide team at [phone] if you need to cancel or reschedule your appointment.   If you are experiencing a Mental Health or Behavioral Health Crisis or need someone to talk to, please call 1-800-273-TALK (toll free, 24 hour hotline)  Patient verbalizes understanding of instructions and care plan provided today and agrees to view in MyChart. Active MyChart status and patient understanding of how to access instructions and care plan via MyChart confirmed with  patient.     Delsa Sale RN BSN CCM [place]  [facility], [facility] Nurse Care Coordinator  Direct Dial: [phone] Website: [email or web address]

## 2023-02-14 LAB — CBC WITH DIFFERENTIAL/PLATELET
Basophils Absolute: 0 10*3/uL (ref 0.0–0.2)
Basos: 1 %
EOS (ABSOLUTE): 0.2 10*3/uL (ref 0.0–0.4)
Eos: 3 %
Hematocrit: 38.3 % (ref 34.0–46.6)
Hemoglobin: 12.6 g/dL (ref 11.1–15.9)
Immature Grans (Abs): 0 10*3/uL (ref 0.0–0.1)
Immature Granulocytes: 0 %
Lymphocytes Absolute: 2.1 10*3/uL (ref 0.7–3.1)
Lymphs: 35 %
MCH: 28.8 pg (ref 26.6–33.0)
MCHC: 32.9 g/dL (ref 31.5–35.7)
MCV: 88 fL (ref 79–97)
Monocytes Absolute: 0.5 10*3/uL (ref 0.1–0.9)
Monocytes: 8 %
Neutrophils Absolute: 3.2 10*3/uL (ref 1.4–7.0)
Neutrophils: 53 %
Platelets: 233 10*3/uL (ref 150–450)
RBC: 4.37 x10E6/uL (ref 3.77–5.28)
RDW: 12.6 % (ref 11.7–15.4)
WBC: 6 10*3/uL (ref 3.4–10.8)

## 2023-02-14 LAB — BMP8+EGFR
BUN/Creatinine Ratio: 8 — ABNORMAL LOW (ref 12–28)
BUN: 15 mg/dL (ref 8–27)
CO2: 21 mmol/L (ref 20–29)
Calcium: 9.8 mg/dL (ref 8.7–10.3)
Chloride: 102 mmol/L (ref 96–106)
Creatinine, Ser: 1.79 mg/dL — ABNORMAL HIGH (ref 0.57–1.00)
Glucose: 97 mg/dL (ref 70–99)
Potassium: 4.4 mmol/L (ref 3.5–5.2)
Sodium: 141 mmol/L (ref 134–144)
eGFR: 28 mL/min/{1.73_m2} — ABNORMAL LOW (ref >59)

## 2023-02-14 LAB — IRON,TIBC AND FERRITIN PANEL
Ferritin: 78 ng/mL (ref 15–150)
Iron Saturation: 21 % (ref 15–55)
Iron: 72 ug/dL (ref 27–139)
Total Iron Binding Capacity: 342 ug/dL (ref 250–450)
UIBC: 270 ug/dL (ref 118–369)

## 2023-02-14 LAB — VITAMIN B12: Vitamin B-12: 917 pg/mL (ref 232–1245)

## 2023-02-14 LAB — HEMOGLOBIN A1C
Est. average glucose Bld gHb Est-mCnc: 166 mg/dL
Hgb A1c MFr Bld: 7.4 % — ABNORMAL HIGH (ref 4.8–5.6)

## 2023-02-14 LAB — VITAMIN D 25 HYDROXY (VIT D DEFICIENCY, FRACTURES): Vit D, 25-Hydroxy: 42.5 ng/mL (ref 30.0–100.0)

## 2023-02-24 ENCOUNTER — Encounter: Payer: Self-pay | Admitting: Nurse Practitioner

## 2023-02-24 ENCOUNTER — Ambulatory Visit: Payer: Self-pay

## 2023-02-24 ENCOUNTER — Other Ambulatory Visit: Payer: Self-pay | Admitting: Nurse Practitioner

## 2023-02-24 DIAGNOSIS — Z2821 Immunization not carried out because of patient refusal: Secondary | ICD-10-CM | POA: Insufficient documentation

## 2023-02-24 DIAGNOSIS — R5383 Other fatigue: Secondary | ICD-10-CM | POA: Insufficient documentation

## 2023-02-24 DIAGNOSIS — Z741 Need for assistance with personal care: Secondary | ICD-10-CM | POA: Insufficient documentation

## 2023-02-24 DIAGNOSIS — I10 Essential (primary) hypertension: Secondary | ICD-10-CM

## 2023-02-24 NOTE — Assessment & Plan Note (Signed)
She is requiring assistance with her ADLs cooking, cleaning, bathing.  Would like to have a nursing assistant to come in to assist. Will fax to the company of their choice.

## 2023-02-24 NOTE — Assessment & Plan Note (Signed)
She is encouraged to strive for BMI less than 30 to decrease cardiac risk. Advised to aim for at least 150 minutes of exercise per week.

## 2023-02-24 NOTE — Assessment & Plan Note (Signed)
Unclear on why she has chronic fatigue.  Some of this may be related to deconditioning.  Advised to get up and move around every hour to help with her fatigue.

## 2023-02-24 NOTE — Assessment & Plan Note (Signed)
Discussed the importance of taking a stool softener and using a probiotic daily to help with her constipation.  Advised to stay well-hydrated with water.

## 2023-02-24 NOTE — Assessment & Plan Note (Addendum)
Discussed with patient and her son informing that the patient does have type 2 diabetes this is why she is taking the Rybelsus.  Blood pressure is stable at the border.  Advised to focus on lifestyle modifications.  I also had Angel from [facility] to stop in and talk with patient to assist with any needs.

## 2023-02-24 NOTE — Patient Instructions (Signed)
Visit Information  Thank you for taking time to visit with me today. Please don't hesitate to contact me if I can be of assistance to you.   Following are the goals we discussed today:   Goals Addressed             This Visit's Progress    COMPLETED: RN Care Coordination Activities: further follow up is needed       Care Coordination Interventions: Completed successful outbound call to patient  See other goals      To improve fatigue and strengthening   On track    Care Coordination Interventions: Completed successful outbound call to patient  Evaluation of current treatment plan related to worsening fatigue and patient's adherence to plan as established by provider Reviewed and discussed recent PCP follow up for evaluation of persistent fatigue Review of patient status, including review of consultant's reports, relevant laboratory and other test results, and medications completed, PCP referral for Cardiology  Educated patient about the referral process and net steps Counseled on the importance of exercise goals with target of 150 minutes per week as tolerated, instructed to balance activity with rest  Mailed printed educational materials related to Chair Exercises  Discussed plans with patient for ongoing care coordination follow up and provided patient with direct contact information for nurse care coordinator     To improve kidney function       Care Coordination Interventions: Assessed the Patient understanding of chronic kidney disease    Evaluation of current treatment plan related to chronic kidney disease self management and patient's adherence to plan as established by provider      Reviewed prescribed diet increase daily water intake to 48-64 oz daily unless otherwise directed  Assessed social determinant of health barriers    Provided education on kidney disease progression    Last practice recorded BP readings:  BP Readings from Last 3 Encounters:  02/13/23 130/80   12/05/22 114/62  11/18/22 (!) 122/58   Most recent eGFR/CrCl:  Lab Results  Component Value Date   EGFR 28 (L) 02/13/2023    No components found for: "CRCL"      To work on lowering A1c <7.0 %       Care Coordination Interventions: Provided education to patient about basic DM disease process Review of patient status, including review of consultants reports, relevant laboratory and other test results, and medications completed Provided patient with written educational materials related to hypo and hyperglycemia and importance of correct treatment Advised patient, providing education and rationale, to check cbg daily before breakfast and at bedtime and record, calling PCP for findings outside established parameters Determined patient needs further assistance with how to use her glucometer  Collaborated with Arnette Felts FNP requesting outreach to patient in order to schedule a nurse visit to demonstrate proper glucometer usage, the office will call patient to schedule a visit Sent my chart message to patient advising of the above stated Counseled on Diabetic diet, my plate method, 811 minutes of moderate intensity exercise/week Mailed printed educational materials related to Diabetes management  Assessed patient's understanding of A1c goal: <7% Lab Results  Component Value Date   HGBA1C 7.4 (H) 02/13/2023          Our next appointment is by telephone on 03/03/23 at 1:00 PM  Please call the care guide team at [phone] if you need to cancel or reschedule your appointment.   If you are experiencing a Mental Health or Behavioral Health Crisis or need  someone to talk to, please call 1-800-273-TALK (toll free, 24 hour hotline)  Patient verbalizes understanding of instructions and care plan provided today and agrees to view in MyChart. Active MyChart status and patient understanding of how to access instructions and care plan via MyChart confirmed with patient.     Delsa Sale RN  BSN CCM [place]  [facility], [facility] Health Nurse Care Coordinator  Direct Dial: [phone] Website: [email or web address]

## 2023-02-24 NOTE — Assessment & Plan Note (Signed)

## 2023-02-24 NOTE — Patient Outreach (Signed)
Care Coordination   Follow Up Visit Note   02/24/2023 Name: Donna Gilmore MRN: [id number] DOB: 04/01/1940  Donna Gilmore is a 83 y.o. year old female who sees Arnette Felts, FNP for primary care. I spoke with  Donna Gilmore by phone today.  What matters to the patients health and wellness today?  Patient would like to work on increasing her daily water intake. Patient would like to improve her fatigue by implementing an exercise regimen.     Goals Addressed             This Visit's Progress    COMPLETED: RN Care Coordination Activities: further follow up is needed       Care Coordination Interventions: Completed successful outbound call to patient  See other goals      To improve fatigue and strengthening   On track    Care Coordination Interventions: Completed successful outbound call to patient  Evaluation of current treatment plan related to worsening fatigue and patient's adherence to plan as established by provider Reviewed and discussed recent PCP follow up for evaluation of persistent fatigue Review of patient status, including review of consultant's reports, relevant laboratory and other test results, and medications completed, PCP referral for Cardiology  Educated patient about the referral process and net steps Counseled on the importance of exercise goals with target of 150 minutes per week as tolerated, instructed to balance activity with rest  Mailed printed educational materials related to Chair Exercises  Discussed plans with patient for ongoing care coordination follow up and provided patient with direct contact information for nurse care coordinator     To improve kidney function       Care Coordination Interventions: Assessed the Patient understanding of chronic kidney disease    Evaluation of current treatment plan related to chronic kidney disease self management and patient's adherence to plan as established by provider      Reviewed prescribed diet  increase daily water intake to 48-64 oz daily unless otherwise directed  Assessed social determinant of health barriers    Provided education on kidney disease progression    Last practice recorded BP readings:  BP Readings from Last 3 Encounters:  02/13/23 130/80  12/05/22 114/62  11/18/22 (!) 122/58   Most recent eGFR/CrCl:  Lab Results  Component Value Date   EGFR 28 (L) 02/13/2023    No components found for: "CRCL"         To work on lowering A1c <7.0 %       Care Coordination Interventions: Provided education to patient about basic DM disease process Review of patient status, including review of consultants reports, relevant laboratory and other test results, and medications completed Provided patient with written educational materials related to hypo and hyperglycemia and importance of correct treatment Advised patient, providing education and rationale, to check cbg daily before breakfast and at bedtime and record, calling PCP for findings outside established parameters Determined patient needs further assistance with how to use her glucometer  Collaborated with Arnette Felts FNP requesting outreach to patient in order to schedule a nurse visit to demonstrate proper glucometer usage, the office will call patient to schedule a visit Sent my chart message to patient advising of the above stated Counseled on Diabetic diet, my plate method, 956 minutes of moderate intensity exercise/week Mailed printed educational materials related to Diabetes management  Assessed patient's understanding of A1c goal: <7% Lab Results  Component Value Date   HGBA1C 7.4 (H) 02/13/2023  Interventions Today    Flowsheet Row Most Recent Value  Chronic Disease   Chronic disease during today's visit Diabetes, Chronic Kidney Disease/End Stage Renal Disease (ESRD)  General Interventions   General Interventions Discussed/Reviewed General Interventions Reviewed, Doctor Visits, Communication with   Doctor Visits Discussed/Reviewed PCP, Doctor Visits Reviewed, Doctor Visits Discussed  Communication with PCP/Specialists  Arnette Felts FNP]  Exercise Interventions   Exercise Discussed/Reviewed Physical Activity, Exercise Reviewed, Exercise Discussed, Assistive device use and maintanence  Physical Activity Discussed/Reviewed Types of exercise, Physical Activity Reviewed, Physical Activity Discussed, PREP  Education Interventions   Education Provided Provided Education, Provided Printed Education  Provided Verbal Education On Exercise, When to see the doctor, Nutrition, Labs, Medication, Eye Care, Foot Care  Labs Reviewed Hgb A1c, Kidney Function  Nutrition Interventions   Nutrition Discussed/Reviewed Nutrition Discussed, Nutrition Reviewed, Portion sizes, Carbohydrate meal planning, Fluid intake  Pharmacy Interventions   Pharmacy Dicussed/Reviewed Pharmacy Topics Discussed, Pharmacy Topics Reviewed, Medications and their functions          SDOH assessments and interventions completed:  Yes  SDOH Interventions Today    Flowsheet Row Most Recent Value  SDOH Interventions   Food Insecurity Interventions AMB Referral  Housing Interventions Intervention Not Indicated  Utilities Interventions AMB Referral        Care Coordination Interventions:  Yes, provided   Follow up plan: Follow up call scheduled for 03/03/23 @1 :00 PM    Encounter Outcome:  Patient Visit Completed

## 2023-02-25 ENCOUNTER — Other Ambulatory Visit: Payer: Self-pay

## 2023-02-26 ENCOUNTER — Other Ambulatory Visit: Payer: Self-pay | Admitting: Nurse Practitioner

## 2023-02-26 DIAGNOSIS — Z741 Need for assistance with personal care: Secondary | ICD-10-CM

## 2023-03-03 ENCOUNTER — Ambulatory Visit: Payer: Self-pay

## 2023-03-03 NOTE — Patient Instructions (Signed)
Visit Information  Thank you for taking time to visit with me today. Please don't hesitate to contact me if I can be of assistance to you.   Following are the goals we discussed today:   Goals Addressed             This Visit's Progress    To improve fatigue and strengthening   On track    Care Coordination Interventions: Completed successful outbound call to patient and son Oswaldo Done Reviewed and discussed upcoming initial follow up with Cardiologist, Dr. Rennis Golden set for 04/11/23 @10 :40 AM, son Oswaldo Done will provide transportation  Discussed plans with patient for ongoing care coordination follow up and provided patient with direct contact information for nurse care coordinator     To work on lowering A1c <7.0 %   On track    Care Coordination Interventions: Reviewed and discussed with patient and son Oswaldo Done, patient's upcoming scheduled nurse visit at PCP office on 03/04/23 @2 :00 PM for demonstration on glucometer usage, son Oswaldo Done will provide transportation   Discussed plans with patient for ongoing care coordination follow up and provided patient with direct contact information for nurse care coordinator        Our next appointment is by telephone on 04/15/23 at 1:00 PM  Please call the care guide team at [phone] if you need to cancel or reschedule your appointment.   If you are experiencing a Mental Health or Behavioral Health Crisis or need someone to talk to, please call 1-800-273-TALK (toll free, 24 hour hotline)  Patient verbalizes understanding of instructions and care plan provided today and agrees to view in MyChart. Active MyChart status and patient understanding of how to access instructions and care plan via MyChart confirmed with patient.     Delsa Sale RN BSN CCM [place]  [facility], [facility] Nurse Care Coordinator  Direct Dial: [phone] Website: [email or web address]

## 2023-03-03 NOTE — Patient Outreach (Signed)
  Care Coordination   Follow Up Visit Note   03/03/2023 Name: Donna Gilmore MRN: [id number] DOB: Jun 29, 1940  Donna Gilmore is a 83 y.o. year old female who sees Arnette Felts, FNP for primary care. I spoke with  Donna Gilmore by phone today.  What matters to the patients health and wellness today?  Patient would like to learn how to use her glucometer. She would like to f/u with Cardiology to evaluate her fatigue.     Goals Addressed             This Visit's Progress    To improve fatigue and strengthening   On track    Care Coordination Interventions: Completed successful outbound call to patient and son Donna Gilmore Done Reviewed and discussed upcoming initial follow up with Cardiologist, Dr. Rennis Golden set for 04/11/23 @10 :40 AM, son Donna Gilmore Done will provide transportation  Discussed plans with patient for ongoing care coordination follow up and provided patient with direct contact information for nurse care coordinator     To work on lowering A1c <7.0 %   On track    Care Coordination Interventions: Reviewed and discussed with patient and son Donna Gilmore Done, patient's upcoming scheduled nurse visit at PCP office on 03/04/23 @2 :00 PM for demonstration on glucometer usage, son Donna Gilmore Done will provide transportation   Discussed plans with patient for ongoing care coordination follow up and provided patient with direct contact information for nurse care coordinator    Interventions Today    Flowsheet Row Most Recent Value  Chronic Disease   Chronic disease during today's visit Other, Diabetes  [fatigue]  General Interventions   General Interventions Discussed/Reviewed General Interventions Discussed, General Interventions Reviewed, Doctor Visits, Durable Medical Equipment (DME)  Doctor Visits Discussed/Reviewed Doctor Visits Discussed, Doctor Visits Reviewed, PCP, Specialist  Durable Medical Equipment (DME) Glucomoter  Education Interventions   Education Provided Provided Education  Provided Verbal  Education On Blood Sugar Monitoring, When to see the doctor, Medication  Pharmacy Interventions   Pharmacy Dicussed/Reviewed Pharmacy Topics Discussed, Pharmacy Topics Reviewed, Medication Adherence  Medication Adherence Unable to refill medication  [Instructed son Donna Gilmore Done to contact mail order pharmacy to request refill]          SDOH assessments and interventions completed:  No     Care Coordination Interventions:  Yes, provided   Follow up plan: Follow up call scheduled for 04/15/23 @1 :00 PM    Encounter Outcome:  Patient Visit Completed

## 2023-03-04 ENCOUNTER — Ambulatory Visit: Payer: Medicare Other

## 2023-03-04 VITALS — BP 130/78 | HR 98 | Temp 98.5°F | Ht 63.0 in | Wt 190.0 lb

## 2023-03-04 DIAGNOSIS — N1832 Chronic kidney disease, stage 3b: Secondary | ICD-10-CM

## 2023-03-04 NOTE — Progress Notes (Signed)
Patient presents today for glucometer teaching. Patient and son verbalized understanding. Patient did use meter on self after being showed how to use it.

## 2023-03-31 ENCOUNTER — Telehealth: Payer: Self-pay

## 2023-03-31 ENCOUNTER — Encounter: Payer: Self-pay | Admitting: Nurse Practitioner

## 2023-03-31 ENCOUNTER — Ambulatory Visit: Admitting: Nurse Practitioner

## 2023-03-31 ENCOUNTER — Other Ambulatory Visit: Payer: Self-pay | Admitting: Nurse Practitioner

## 2023-03-31 ENCOUNTER — Ambulatory Visit: Payer: Self-pay | Admitting: Nurse Practitioner

## 2023-03-31 VITALS — BP 110/70 | HR 65 | Temp 97.3°F | Ht 63.0 in | Wt 191.8 lb

## 2023-03-31 DIAGNOSIS — Z2821 Immunization not carried out because of patient refusal: Secondary | ICD-10-CM | POA: Diagnosis not present

## 2023-03-31 DIAGNOSIS — R0989 Other specified symptoms and signs involving the circulatory and respiratory systems: Secondary | ICD-10-CM | POA: Diagnosis not present

## 2023-03-31 DIAGNOSIS — R0602 Shortness of breath: Secondary | ICD-10-CM

## 2023-03-31 DIAGNOSIS — J3489 Other specified disorders of nose and nasal sinuses: Secondary | ICD-10-CM | POA: Diagnosis not present

## 2023-03-31 LAB — POC SOFIA 2 FLU + SARS ANTIGEN FIA
Influenza A, POC: NEGATIVE
Influenza B, POC: NEGATIVE
SARS Coronavirus 2 Ag: NEGATIVE

## 2023-03-31 MED ORDER — MOMETASONE FUROATE 50 MCG/ACT NA SUSP
2.0000 | Freq: Every day | NASAL | 2 refills | Status: AC
Start: 2023-03-31 — End: 2024-03-30

## 2023-03-31 MED ORDER — ALBUTEROL SULFATE HFA 108 (90 BASE) MCG/ACT IN AERS
2.0000 | INHALATION_SPRAY | Freq: Four times a day (QID) | RESPIRATORY_TRACT | 2 refills | Status: AC | PRN
Start: 2023-03-31 — End: ?

## 2023-03-31 MED ORDER — COVID-19 AT-HOME TEST VI KIT: 1.0000 | PACK | 2 refills | Status: DC | PRN

## 2023-03-31 NOTE — Progress Notes (Signed)
 Madelaine Bhat, CMA,acting as a Neurosurgeon for Arnette Felts, FNP.,have documented all relevant documentation on the behalf of Arnette Felts, FNP,as directed by  Arnette Felts, FNP while in the presence of Arnette Felts, FNP.  Subjective:  Patient ID: Donna Gilmore , female    DOB: Jul 04, 1940 , 83 y.o.   MRN: [id number]  No chief complaint on file.   HPI  Patient presents today for runny nose, sinus pain, and SOB. Patient reports it first started today at 6:30am.      Past Medical History:  Diagnosis Date   Acute left-sided low back pain with left-sided sciatica 05/04/2018   Benign paroxysmal positional vertigo 08/26/2012   Chest pain 09/21/2012   CVA (cerebral infarction) 08/25/2012   Diabetes mellitus without complication (HCC)    Dyslipidemia 08/25/2012   Essential hypertension, benign 08/25/2012   Hyperlipidemia    Hypertension    Meralgia paraesthetica, left 01/12/2018   Stroke [facility])      Family History  Problem Relation Age of Onset   Kidney failure Mother    Diabetes Mother    Alzheimer's disease Mother    Alzheimer's disease Father    Prostate cancer Father    Diabetes Maternal Grandmother    Diabetes Maternal Grandfather    Alzheimer's disease Paternal Grandmother    Alzheimer's disease Paternal Grandfather    Prostate cancer Paternal Uncle      Current Outpatient Medications:    acetaminophen (TYLENOL) 500 MG tablet, Take 1 tablet (500 mg total) by mouth every 6 (six) hours as needed., Disp: 30 tablet, Rfl: 0   albuterol (VENTOLIN HFA) 108 (90 Base) MCG/ACT inhaler, Inhale 2 puffs into the lungs every 6 (six) hours as needed for wheezing or shortness of breath., Disp: 8.5 g, Rfl: 2   atorvastatin (LIPITOR) 80 MG tablet, TAKE 1 TABLET BY MOUTH ONCE  DAILY, Disp: 100 tablet, Rfl: 2   azelastine (OPTIVAR) 0.05 % ophthalmic solution, Place 1 drop into both eyes 2 (two) times daily., Disp: 6 mL, Rfl: 5   Budeson-Glycopyrrol-Formoterol (BREZTRI AEROSPHERE) 160-9-4.8  MCG/ACT AERO, Inhale 2 puffs into the lungs 2 (two) times daily., Disp: 32.1 g, Rfl: 3   cholecalciferol (VITAMIN D3) 25 MCG (1000 UNIT) tablet, Take 1,000 Units by mouth daily., Disp: , Rfl:    clopidogrel (PLAVIX) 75 MG tablet, TAKE 1 TABLET BY MOUTH DAILY, Disp: 100 tablet, Rfl: 2   COVID-19 At-Home Test KIT, 1 each by In Vitro route as needed., Disp: 1 kit, Rfl: 2   dapagliflozin propanediol (FARXIGA) 10 MG TABS tablet, Take 1 tablet (10 mg total) by mouth daily before breakfast., Disp: , Rfl:    donepezil (ARICEPT) 10 MG tablet, Take 1 tablet (10 mg total) by mouth at bedtime., Disp: 100 tablet, Rfl: 2   ezetimibe (ZETIA) 10 MG tablet, Take 1 tablet (10 mg total) by mouth daily., Disp: 100 tablet, Rfl: 3   gabapentin (NEURONTIN) 100 MG capsule, TAKE 1 CAPSULE BY MOUTH 3 TIMES  DAILY AS NEEDED, Disp: 300 capsule, Rfl: 2   levocetirizine (XYZAL) 5 MG tablet, Take 1 tablet (5 mg total) by mouth every evening., Disp: 30 tablet, Rfl: 11   loratadine (CLARITIN) 10 MG tablet, Take 1 tablet (10 mg total) by mouth daily. 1-2 tabs a day to help control allergy symptoms and itch, Disp: 30 tablet, Rfl: 6   mometasone (NASONEX) 50 MCG/ACT nasal spray, Place 2 sprays into the nose daily., Disp: 1 each, Rfl: 2   olmesartan (BENICAR) 20 MG tablet, Take  1 tablet (20 mg total) by mouth daily., Disp: 90 tablet, Rfl: 1   Olopatadine HCl 0.6 % SOLN, 2 sprays each nostril 2 times daily as needed for runny nose/ itch., Disp: 30.5 g, Rfl: 6   pantoprazole (PROTONIX) 40 MG tablet, Take 1 tablet (40 mg total) by mouth daily., Disp: 30 tablet, Rfl: 6   pimecrolimus (ELIDEL) 1 % cream, Apply topically 2 (two) times daily. apply thin layer to the skin as needed twice a day for itching or rash, Disp: 30 g, Rfl: 6   vitamin C (ASCORBIC ACID) 500 MG tablet, Take 500 mg by mouth daily., Disp: , Rfl:    VITAMIN D PO, Take 1 tablet by mouth daily., Disp: , Rfl:    desonide (DESOWEN) 0.05 % ointment, Apply 1 Application topically  2 (two) times daily. for itchy rash (Patient not taking: Reported on 03/31/2023), Disp: 60 g, Rfl: 5   diclofenac Sodium (VOLTAREN) 1 % GEL, Apply topically 4 (four) times daily. (Patient not taking: Reported on 03/31/2023), Disp: , Rfl:    glucose blood (ONETOUCH ULTRA) test strip, USE AS DIRECTED WITH  METER  TO  CHECK  BLOOD  SUGAR  BEFORE  BREAKFAST  AND  DINNER, Disp: 100 each, Rfl: 3   meclizine (ANTIVERT) 25 MG tablet, TAKE 1 TABLET BY MOUTH 2  TIMES EVERY DAY AS NEEDED (Patient not taking: Reported on 11/25/2022), Disp: 180 tablet, Rfl: 1   polyethylene glycol (MIRALAX / GLYCOLAX) 17 g packet, Take 17 g by mouth 2 (two) times daily. (Patient not taking: Reported on 11/25/2022), Disp: 60 packet, Rfl: 3  Current Facility-Administered Medications:    0.9 %  sodium chloride infusion, 500 mL, Intravenous, Once, Hilarie Fredrickson, MD   No Known Allergies   Review of Systems  Constitutional: Negative.  Negative for fatigue.  HENT:  Positive for hearing loss (wearing hearing aids), rhinorrhea and sinus pain. Negative for congestion.   Eyes: Negative.   Respiratory: Negative.  Negative for shortness of breath.   Cardiovascular: Negative.   Gastrointestinal: Negative.   Genitourinary: Negative.   Allergic/Immunologic: Negative.   Neurological: Negative.   Psychiatric/Behavioral: Negative.       Today's Vitals   03/31/23 1526  BP: 110/70  Pulse: 65  Temp: (!) 97.3 F (36.3 C)  TempSrc: Oral  SpO2: 90%  Weight: 191 lb 12.8 oz (87 kg)  Height: 5\' 3"  (1.6 m)  PainSc: 10-Worst pain ever  PainLoc: Nose   Body mass index is 33.98 kg/m.  Wt Readings from Last 3 Encounters:  03/31/23 191 lb 12.8 oz (87 kg)  03/04/23 190 lb (86.2 kg)  02/13/23 190 lb 6.4 oz (86.4 kg)    Objective:  Physical Exam Vitals reviewed.  Constitutional:      General: She is not in acute distress.    Appearance: Normal appearance. She is obese.  HENT:     Head: Normocephalic.     Right Ear: Tympanic membrane,  ear canal and external ear normal. There is no impacted cerumen.     Left Ear: Tympanic membrane, ear canal and external ear normal. There is no impacted cerumen.     Ears:     Comments: Hard of hearing    Nose: No congestion.     Right Turbinates: Enlarged.     Left Turbinates: Enlarged.     Right Sinus: No maxillary sinus tenderness or frontal sinus tenderness.     Left Sinus: No maxillary sinus tenderness or frontal sinus tenderness.  Cardiovascular:  Rate and Rhythm: Normal rate and regular rhythm.     Pulses: Normal pulses.     Heart sounds: Normal heart sounds. No murmur heard. Pulmonary:     Effort: Pulmonary effort is normal. No respiratory distress.     Breath sounds: Normal breath sounds. No wheezing.  Musculoskeletal:        General: Normal range of motion.  Skin:    General: Skin is warm and dry.     Capillary Refill: Capillary refill takes less than 2 seconds.  Neurological:     General: No focal deficit present.     Mental Status: She is alert and oriented to person, place, and time.     Cranial Nerves: No cranial nerve deficit.     Motor: No weakness.  Psychiatric:        Mood and Affect: Mood normal.        Behavior: Behavior normal.        Thought Content: Thought content normal.        Judgment: Judgment normal.     Assessment And Plan:  Runny nose -     POC SOFIA 2 FLU + SARS ANTIGEN FIA -     Mometasone Furoate; Place 2 sprays into the nose daily.  Dispense: 1 each; Refill: 2 -     COVID-19 At-Home Test; 1 each by In Vitro route as needed.  Dispense: 1 kit; Refill: 2  Sinus pain Assessment & Plan: She is to use steroid nasal spray.   Orders: -     POC SOFIA 2 FLU + SARS ANTIGEN FIA -     COVID-19 At-Home Test; 1 each by In Vitro route as needed.  Dispense: 1 kit; Refill: 2  Shortness of breath Assessment & Plan: Breath sounds are equally bilaterally. She is to use an albuterol inhaler as needed for wheezing and cough.   Orders: -     POC  SOFIA 2 FLU + SARS ANTIGEN FIA -     Albuterol Sulfate HFA; Inhale 2 puffs into the lungs every 6 (six) hours as needed for wheezing or shortness of breath.  Dispense: 8.5 g; Refill: 2 -     COVID-19 At-Home Test; 1 each by In Vitro route as needed.  Dispense: 1 kit; Refill: 2  COVID-19 vaccination declined Assessment & Plan: Declines covid 19 vaccine. Discussed risk of covid 52 and if she changes her mind about the vaccine to call the office. Education has been provided regarding the importance of this vaccine but patient still declined. Advised may receive this vaccine at local pharmacy or Health Dept.or vaccine clinic. Aware to provide a copy of the vaccination record if obtained from local pharmacy or Health Dept.  Encouraged to take multivitamin, vitamin d, vitamin c and zinc to increase immune system. Aware can call office if would like to have vaccine here at office. Verbalized acceptance and understanding. She is also sick currently     No follow-ups on file.  Patient was given opportunity to ask questions. Patient verbalized understanding of the plan and was able to repeat key elements of the plan. All questions were answered to their satisfaction.    Jeanell Sparrow, FNP, have reviewed all documentation for this visit. The documentation on 03/31/23 for the exam, diagnosis, procedures, and orders are all accurate and complete.   IF YOU HAVE BEEN REFERRED TO A SPECIALIST, IT MAY TAKE 1-2 WEEKS TO SCHEDULE/PROCESS THE REFERRAL. IF YOU HAVE NOT HEARD FROM US/SPECIALIST IN TWO WEEKS, PLEASE  GIVE [place] A CALL AT [phone].

## 2023-03-31 NOTE — Patient Instructions (Signed)
 You can get HBP [place] brand medication for her cold symptoms this is safe to take with hypertension

## 2023-03-31 NOTE — Telephone Encounter (Signed)
 Copied from CRM [id number]. Topic: Clinical - Red Word Triage >> Mar 31, 2023  2:49 PM Turkey B wrote: Pt has itchy runny nose and pain in her nose    Chief Complaint: Cough Symptoms: Cough, Nose pain and drainage Frequency: Started today Disposition: [] ED /[] Urgent Care (no appt availability in office) / [x] Appointment(In office/virtual)/ []  [place] [facility]/ [] Home Care/ [] Refused Recommended Disposition /[] [place] [facility]/ []  Follow-up with PCP Additional Notes: Patient's son called with patient also present. Patient has been experiencing cough, nasal pain and drainage with some SOB.  Appointment scheduled for this afternoon.  Reason for Disposition  [1] MILD difficulty breathing (e.g., minimal/no SOB at rest, SOB with walking, pulse <100) AND [2] still present when not coughing  Answer Assessment - Initial Assessment Questions 1. ONSET: "When did the cough begin?"      Today  2. SPUTUM: "Describe the color of your sputum" (none, dry cough; clear, white, yellow, green)     Dry cough  3. HEMOPTYSIS: "Are you coughing up any blood?" If so ask: "How much?" (flecks, streaks, tablespoons, etc.)     N/A  4. DIFFICULTY BREATHING: "Are you having difficulty breathing?" If Yes, ask: "How bad is it?" (e.g., mild, moderate, severe)    - MILD: No SOB at rest, mild SOB with walking, speaks normally in sentences, can lie down, no retractions, pulse < 100.    - MODERATE: SOB at rest, SOB with minimal exertion and prefers to sit, cannot lie down flat, speaks in phrases, mild retractions, audible wheezing, pulse 100-120.    - SEVERE: Very SOB at rest, speaks in single words, struggling to breathe, sitting hunched forward, retractions, pulse > 120      Patient stated she is breathing hard, feels SOB at rest  5. FEVER: "Do you have a fever?" If Yes, ask: "What is your temperature, how was it measured, and when did it start?"     Forehead feels warm  6. OTHER SYMPTOMS: "Do you have any  other symptoms?" (e.g., runny nose, wheezing, chest pain)       Runny nose, nose pain  Protocols used: Cough - Acute Non-Productive-A-AH

## 2023-03-31 NOTE — Telephone Encounter (Signed)
 Patients son called regarding a prior auth for home health to be reimbursed - patients son gave number [phone] to [employer]. Reached out to [facility] and they stated that the home health agency should call and expedite. Son was called and giving this information.

## 2023-04-10 ENCOUNTER — Other Ambulatory Visit: Payer: Self-pay | Admitting: Nurse Practitioner

## 2023-04-10 NOTE — Telephone Encounter (Signed)
 Copied from CRM [id number]. Topic: Clinical - Medication Refill >> Apr 10, 2023 12:54 PM Donna Gilmore wrote: Most Recent Primary Care Visit:  Provider: Arnette Felts  Department: [facility]  Visit Type: ACUTE  Date: 03/31/2023  Medication: [place] II test strip  Has the patient contacted their pharmacy? No  Is this the correct pharmacy for this prescription? Yes If no, delete pharmacy and type the correct one.  This is the patient's preferred pharmacy:  [facility] [address] [place], [place] - [address] [place] [address] Phone: [phone] Fax: [phone]   Has the prescription been filled recently? No  Is the patient out of the medication? Yes  Has the patient been seen for an appointment in the last year OR does the patient have an upcoming appointment? Yes  Can we respond through MyChart? No  Pt is completely out of test strips and has no way to check her sugar.  Needs today if possible.  Son Oswaldo Done would like a call back hen done, he needs to go pick up asap >> Apr 10, 2023  3:58 PM Clayton Bibles wrote: She is out of the [facility] ULTRA II test strip. He wants to make sure the order is sent today. Please call him to let him know it is done. If he does not hear anything, he will come by the office before 5 PM

## 2023-04-10 NOTE — Telephone Encounter (Signed)
 Copied from CRM [id number]. Topic: Clinical - Medication Refill >> Apr 10, 2023 12:54 PM [name] wrote: Most Recent Primary Care Visit:  Provider: Arnette Felts  Department: [facility]  Visit Type: ACUTE  Date: 03/31/2023  Medication: [place] Bound II test strip  Has the patient contacted their pharmacy? No  Is this the correct pharmacy for this prescription? Yes If no, delete pharmacy and type the correct one.  This is the patient's preferred pharmacy:  [facility] [address] [place], [place] - [address] [place] [address] Phone: [phone] Fax: [phone]   Has the prescription been filled recently? No  Is the patient out of the medication? Yes  Has the patient been seen for an appointment in the last year OR does the patient have an upcoming appointment? Yes  Can we respond through MyChart? No  Pt is completely out of test strips and has no way to check her sugar.  Needs today if possible.  Son Oswaldo Done would like a call back hen done, he needs to go pick up asap

## 2023-04-11 ENCOUNTER — Ambulatory Visit: Payer: Medicare Other | Attending: Internal Medicine | Admitting: Internal Medicine

## 2023-04-11 ENCOUNTER — Encounter: Payer: Self-pay | Admitting: Internal Medicine

## 2023-04-11 VITALS — BP 118/70 | HR 62 | Ht 64.0 in | Wt 191.0 lb

## 2023-04-11 DIAGNOSIS — E119 Type 2 diabetes mellitus without complications: Secondary | ICD-10-CM | POA: Diagnosis not present

## 2023-04-11 DIAGNOSIS — J3489 Other specified disorders of nose and nasal sinuses: Secondary | ICD-10-CM | POA: Insufficient documentation

## 2023-04-11 DIAGNOSIS — I10 Essential (primary) hypertension: Secondary | ICD-10-CM | POA: Diagnosis not present

## 2023-04-11 DIAGNOSIS — R0789 Other chest pain: Secondary | ICD-10-CM

## 2023-04-11 DIAGNOSIS — R5383 Other fatigue: Secondary | ICD-10-CM | POA: Diagnosis not present

## 2023-04-11 DIAGNOSIS — E785 Hyperlipidemia, unspecified: Secondary | ICD-10-CM

## 2023-04-11 NOTE — Assessment & Plan Note (Signed)
 She is to use steroid nasal spray.

## 2023-04-11 NOTE — Patient Instructions (Signed)
 Medication Instructions:  NO CHANGES  *If you need a refill on your cardiac medications before your next appointment, please call your pharmacy*   Testing/Procedures: Your physician has requested that you have an echocardiogram. Echocardiography is a painless test that uses sound waves to create images of your heart. It provides your doctor with information about the size and shape of your heart and how well your heart's chambers and valves are working. This procedure takes approximately one hour. There are no restrictions for this procedure. Please do NOT wear cologne, perfume, aftershave, or lotions (deodorant is allowed). Please arrive 15 minutes prior to your appointment time.  Please note: We ask at that you not bring children with you during ultrasound (echo/ vascular) testing. Due to room size and safety concerns, children are not allowed in the ultrasound rooms during exams. Our front office staff cannot provide observation of children in our lobby area while testing is being conducted. An adult accompanying a patient to their appointment will only be allowed in the ultrasound room at the discretion of the ultrasound technician under special circumstances. We apologize for any inconvenience.  Dr. Rennis Golden has ordered a Lexiscan Myocardial Perfusion Imaging Study.  Please arrive 15 minutes prior to your appointment time for registration and insurance purposes.   The test will take approximately 3 to 4 hours to complete; you may bring reading material.  If someone comes with you to your appointment, they will need to remain in the main lobby due to limited space in the testing area. **If you are pregnant or breastfeeding, please notify the nuclear lab prior to your appointment**   How to prepare for your Myocardial Perfusion Test: Do not eat or drink 3 hours prior to your test, except you may have water. Do not consume products containing caffeine (regular or decaffeinated) 12 hours prior to  your test. (ex: coffee, chocolate, sodas, tea). Do wear comfortable clothes (no dresses or overalls) and walking shoes, tennis shoes preferred (No heels or open toe shoes are allowed). Do NOT wear cologne, perfume, aftershave, or lotions (deodorant is allowed). If you use an inhaler, use it the AM of your test and bring it with you.  If you use a nebulizer, use it the AM of your test.  If these instructions are not followed, your test will have to be rescheduled.    Follow-Up: At [facility], you and your health needs are our priority.  As part of our continuing mission to provide you with exceptional heart care, we have created designated Provider Care Teams.  These Care Teams include your primary Cardiologist (physician) and Advanced Practice Providers (APPs -  Physician Assistants and Nurse Practitioners) who all work together to provide you with the care you need, when you need it.  We recommend signing up for the patient portal called "MyChart".  Sign up information is provided on this After Visit Summary.  MyChart is used to connect with patients for Virtual Visits (Telemedicine).  Patients are able to view lab/test results, encounter notes, upcoming appointments, etc.  Non-urgent messages can be sent to your provider as well.   To learn more about what you can do with MyChart, go to [email or web address].    Your next appointment:   6 months with Dr. Rennis Golden or PA/NP  Other Instructions   1st Floor: - Lobby - Registration  - Pharmacy  - Lab - Cafe  2nd Floor: - PV Lab - Diagnostic Testing (echo, CT, nuclear med)  3rd Floor: - Vacant  4th Floor: - TCTS (cardiothoracic surgery) - AFib Clinic - Structural Heart Clinic - Vascular Surgery  - Vascular Ultrasound  5th Floor: - HeartCare Cardiology (general and EP) - Clinical Pharmacy for coumadin, hypertension, lipid, weight-loss medications, and med management appointments    Valet parking services will be  available as well.

## 2023-04-11 NOTE — Assessment & Plan Note (Signed)
 Breath sounds are equally bilaterally. She is to use an albuterol inhaler as needed for wheezing and cough.

## 2023-04-11 NOTE — Assessment & Plan Note (Signed)
 Declines covid 19 vaccine. Discussed risk of covid 31 and if she changes her mind about the vaccine to call the office. Education has been provided regarding the importance of this vaccine but patient still declined. Advised may receive this vaccine at local pharmacy or Health Dept.or vaccine clinic. Aware to provide a copy of the vaccination record if obtained from local pharmacy or Health Dept.  Encouraged to take multivitamin, vitamin d, vitamin c and zinc to increase immune system. Aware can call office if would like to have vaccine here at office. Verbalized acceptance and understanding. She is also sick currently

## 2023-04-11 NOTE — Progress Notes (Signed)
 Office visit  Chief Complaint:  Fatigue  Primary Care Physician: Arnette Felts, FNP  Primary Cardiologist:  None  HPI:  Donna Gilmore is a 83 y.o. female who is being seen today for the evaluation of dyslipidemia at the request of Arnette Felts, FNP.  This is a pleasant 83 year old female kindly referred for evaluation management of dyslipidemia.  Unfortunately she has type 2 diabetes, hypertension and dyslipidemia and has had a prior stroke.  She is referred for evaluation management of her cholesterol.  She has been compliant with atorvastatin 80 mg daily, but despite this her labs in December 2022 showed total cholesterol 232, triglycerides 379, HDL 50 and LDL 116.  Target LDL is less than 70 or possibly 55 given very high risk.  She will certainly need additional therapies.  We discussed diet at length today 2.  She does eat a lot of sauted vegetables such as collard greens and other meats that may be fried including fried fish at least once a week and ice cream on a regular basis.  All of these may be contributing to high triglycerides and elevated cholesterol.  09/07/2021  Donna Gilmore returns today for follow-up of dyslipidemia.  She has done well on 80 mg atorvastatin with the addition of 10 mg ezetimibe.  Repeat lipids show significant improvement with total cholesterol now 142 (down from 232), triglycerides 209 (down from 379), HDL 56 and LDL of 53 (down from 116).  This represents marked improvement in her lipids and now puts her at her goal.  04/11/2023  Donna Gilmore is seen today for an acute visit.  Recently she saw her primary care provider and had been having some upper respiratory symptoms.  I seen her in the past for lipid management.  She is referred now for progressive fatigue.  She is reporting some left-sided arm pain and left shoulder pain that goes into the left chest.  She has had chest pain in the past which is felt to be somewhat atypical.  She last had stress  testing back in 2014 which was negative for ischemia.  She denies any worsening chest pain with exertion.  Most of her pain is worse when laying on 1 side.  She does not really report shortness of breath but she says wears out very easily just walking across the room.  She had recently had some anemia however hemoglobin came up to 12 recently by labs in January.  She does have a history of prior stroke, diabetes, hypertension and dyslipidemia which are risk factors for heart disease.  PMHx:  Past Medical History:  Diagnosis Date   Acute left-sided low back pain with left-sided sciatica 05/04/2018   Benign paroxysmal positional vertigo 08/26/2012   Chest pain 09/21/2012   CVA (cerebral infarction) 08/25/2012   Diabetes mellitus without complication (HCC)    Dyslipidemia 08/25/2012   Essential hypertension, benign 08/25/2012   Hyperlipidemia    Hypertension    Meralgia paraesthetica, left 01/12/2018   Stroke (HCC)     No past surgical history on file.  FAMHx:  Family History  Problem Relation Age of Onset   Kidney failure Mother    Diabetes Mother    Alzheimer's disease Mother    Alzheimer's disease Father    Prostate cancer Father    Diabetes Maternal Grandmother    Diabetes Maternal Grandfather    Alzheimer's disease Paternal Grandmother    Alzheimer's disease Paternal Grandfather    Prostate cancer Paternal Uncle  SOCHx:   reports that she quit smoking about 6 years ago. Her smoking use included cigarettes. She started smoking about 36 years ago. She has a 9 pack-year smoking history. She has never used smokeless tobacco. She reports that she does not currently use alcohol. She reports that she does not use drugs.  ALLERGIES:  No Known Allergies  ROS: Pertinent items noted in HPI and remainder of comprehensive ROS otherwise negative.  HOME MEDS: Current Outpatient Medications on File Prior to Visit  Medication Sig Dispense Refill   acetaminophen (TYLENOL) 500 MG  tablet Take 1 tablet (500 mg total) by mouth every 6 (six) hours as needed. 30 tablet 0   albuterol (VENTOLIN HFA) 108 (90 Base) MCG/ACT inhaler Inhale 2 puffs into the lungs every 6 (six) hours as needed for wheezing or shortness of breath. 8.5 g 2   atorvastatin (LIPITOR) 80 MG tablet TAKE 1 TABLET BY MOUTH ONCE  DAILY 100 tablet 2   Budeson-Glycopyrrol-Formoterol (BREZTRI AEROSPHERE) 160-9-4.8 MCG/ACT AERO Inhale 2 puffs into the lungs 2 (two) times daily. 32.1 g 3   cholecalciferol (VITAMIN D3) 25 MCG (1000 UNIT) tablet Take 1,000 Units by mouth daily.     clopidogrel (PLAVIX) 75 MG tablet TAKE 1 TABLET BY MOUTH DAILY 100 tablet 2   COVID-19 At-Home Test KIT 1 each by In Vitro route as needed. 1 kit 2   dapagliflozin propanediol (FARXIGA) 10 MG TABS tablet Take 1 tablet (10 mg total) by mouth daily before breakfast.     desonide (DESOWEN) 0.05 % ointment Apply 1 Application topically 2 (two) times daily. for itchy rash 60 g 5   diclofenac Sodium (VOLTAREN) 1 % GEL Apply topically 4 (four) times daily.     donepezil (ARICEPT) 10 MG tablet Take 1 tablet (10 mg total) by mouth at bedtime. 100 tablet 2   ezetimibe (ZETIA) 10 MG tablet Take 1 tablet (10 mg total) by mouth daily. 100 tablet 3   gabapentin (NEURONTIN) 100 MG capsule TAKE 1 CAPSULE BY MOUTH 3 TIMES  DAILY AS NEEDED 300 capsule 2   glucose blood (ONETOUCH ULTRA) test strip USE AS DIRECTED WITH  METER  TO  CHECK  BLOOD  SUGAR  BEFORE  BREAKFAST  AND  DINNER 100 each 3   levocetirizine (XYZAL) 5 MG tablet Take 1 tablet (5 mg total) by mouth every evening. 30 tablet 11   mometasone (NASONEX) 50 MCG/ACT nasal spray Place 2 sprays into the nose daily. 1 each 2   olmesartan (BENICAR) 20 MG tablet Take 1 tablet (20 mg total) by mouth daily. 90 tablet 1   pantoprazole (PROTONIX) 40 MG tablet Take 1 tablet (40 mg total) by mouth daily. 30 tablet 6   vitamin C (ASCORBIC ACID) 500 MG tablet Take 500 mg by mouth daily.     VITAMIN D PO Take 1  tablet by mouth daily.     Current Facility-Administered Medications on File Prior to Visit  Medication Dose Route Frequency Provider Last Rate Last Admin   0.9 %  sodium chloride infusion  500 mL Intravenous Once Hilarie Fredrickson, MD        LABS/IMAGING: No results found for this or any previous visit (from the past 48 hours). No results found.  LIPID PANEL:    Component Value Date/Time   CHOL 187 10/14/2022 1118   TRIG 398 (H) 10/14/2022 1118   HDL 49 10/14/2022 1118   CHOLHDL 3.8 10/14/2022 1118   CHOLHDL 8.7 08/26/2012 0538   VLDL  60 (H) 08/26/2012 0538   LDLCALC 75 10/14/2022 1118    WEIGHTS: Wt Readings from Last 3 Encounters:  04/11/23 191 lb (86.6 kg)  03/31/23 191 lb 12.8 oz (87 kg)  03/04/23 190 lb (86.2 kg)    VITALS: BP 118/70 (BP Location: Left Arm, Patient Position: Sitting, Cuff Size: Normal)   Pulse 62   Ht 5\' 4"  (1.626 m)   Wt 191 lb (86.6 kg)   SpO2 97%   BMI 32.79 kg/m   EXAM: General appearance: alert and no distress Neck: no carotid bruit, no JVD, and thyroid not enlarged, symmetric, no tenderness/mass/nodules Lungs: clear to auscultation bilaterally Heart: regular rate and rhythm, S1, S2 normal, no murmur, click, rub or gallop Abdomen: soft, non-tender; bowel sounds normal; no masses,  no organomegaly Extremities: extremities normal, atraumatic, no cyanosis or edema Pulses: 2+ and symmetric Skin: Skin color, texture, turgor normal. No rashes or lesions Neurologic: Grossly normal Psych: Pleasant  EKG: EKG Interpretation Date/Time:  Friday April 11 2023 11:14:27 EDT Ventricular Rate:  68 PR Interval:  142 QRS Duration:  74 QT Interval:  406 QTC Calculation: 431 R Axis:   0  Text Interpretation: Normal sinus rhythm Normal ECG When compared with ECG of 02-Nov-2022 19:39, No significant change since last tracing Confirmed by Zoila Shutter [phone]) on 04/11/2023 11:18:01 AM    ASSESSMENT: Progressive fatigue and atypical chest pain Mixed  dyslipidemia, goal LDL less than 55 History of stroke Type 2 diabetes Hypertension  PLAN: 1.   Ms. Winkles is referred today for progressive fatigue with exertion and atypical chest pain.  The pain sounds more musculoskeletal and is in both shoulders and worse when she lays on 1 side.  She does have multiple coronary risk factors and and not had an ischemia evaluation since 2014.  Will devise a Lexiscan Myoview and also obtain echocardiogram to further evaluate for cardiac causes of her symptoms.  I will contact her with those results.  Plan otherwise follow-up with APP in about 6 months or sooner as necessary.  Chrystie Nose, MD, [facility], FACP  [place]  [facility]  Medical Director of the Advanced Lipid Disorders &  Cardiovascular Risk Reduction Clinic Diplomate of the American Board of Clinical Lipidology Attending Cardiologist  Direct Dial: [phone]  Fax: [phone]  Website:  [email or web address] Nicely Lonell Stamos 04/11/2023, 11:18 AM

## 2023-04-14 MED ORDER — ONETOUCH ULTRA VI STRP: ORAL_STRIP | 2 refills | Status: DC

## 2023-04-15 ENCOUNTER — Ambulatory Visit: Payer: Self-pay

## 2023-04-15 ENCOUNTER — Ambulatory Visit (HOSPITAL_COMMUNITY): Attending: Internal Medicine

## 2023-04-15 ENCOUNTER — Telehealth (HOSPITAL_COMMUNITY): Payer: Self-pay | Admitting: *Deleted

## 2023-04-15 DIAGNOSIS — I10 Essential (primary) hypertension: Secondary | ICD-10-CM | POA: Diagnosis not present

## 2023-04-15 DIAGNOSIS — R0789 Other chest pain: Secondary | ICD-10-CM | POA: Insufficient documentation

## 2023-04-15 DIAGNOSIS — E119 Type 2 diabetes mellitus without complications: Secondary | ICD-10-CM | POA: Insufficient documentation

## 2023-04-15 DIAGNOSIS — I340 Nonrheumatic mitral (valve) insufficiency: Secondary | ICD-10-CM

## 2023-04-15 DIAGNOSIS — M542 Cervicalgia: Secondary | ICD-10-CM | POA: Diagnosis not present

## 2023-04-15 DIAGNOSIS — E785 Hyperlipidemia, unspecified: Secondary | ICD-10-CM | POA: Diagnosis not present

## 2023-04-15 DIAGNOSIS — R5383 Other fatigue: Secondary | ICD-10-CM | POA: Diagnosis not present

## 2023-04-15 LAB — ECHOCARDIOGRAM COMPLETE: S' Lateral: 2.12 cm

## 2023-04-15 NOTE — Telephone Encounter (Signed)
 Spoke to pt's daughter , Lucendia Herrlich and gave instructions for MPI study.

## 2023-04-16 ENCOUNTER — Encounter: Payer: Self-pay | Admitting: *Deleted

## 2023-04-16 NOTE — Patient Instructions (Signed)
 Visit Information  Thank you for taking time to visit with me today. Please don't hesitate to contact me if I can be of assistance to you.   Following are the goals we discussed today:   Goals Addressed             This Visit's Progress    To improve fatigue and strengthening   On track    Care Coordination Interventions: Evaluation of current treatment plan related to atypical chest pain/fatigue and patient's adherence to plan as established by provider Determined patient completed a follow up visit with Dr. Rennis Golden, Cardiologist for evaluation of atypical chest pain and fatigue  Review of patient status, including review of consultant's reports, relevant laboratory and other test results, and medications completed Reviewed and discussed with patient her upcoming scheduled Myocardial Perfusion test scheduled for 04/17/23 @10 :15 AM Educated patient about this test and what to expect  Discussed plans with patient for ongoing care coordination follow up and provided patient with direct contact information for nurse care coordinator Scheduled nurse follow up call with patient for 06/18/23 @1 :30 PM        To improve kidney function   On track    Care Coordination Interventions: Assessed the Patient understanding of chronic kidney disease    Evaluation of current treatment plan related to chronic kidney disease self management and patient's adherence to plan as established by provider      Reviewed prescribed diet increase daily water intake to 48-64 oz daily unless otherwise directed     Last practice recorded BP readings:  BP Readings from Last 3 Encounters:  04/11/23 118/70  03/31/23 110/70  03/04/23 130/78   Most recent eGFR/CrCl:  Lab Results  Component Value Date   EGFR 28 (L) 02/13/2023    No components found for: "CRCL"      To work on lowering A1c <7.0 %   On track    Care Coordination Interventions: Provided education to patient about basic DM disease process Reviewed  medications with patient and discussed importance of medication adherence Provided patient with written educational materials related to hypo and hyperglycemia and importance of correct treatment Advised patient, providing education and rationale, to check cbg daily before meals and at bedtime and record, calling PCP for findings outside established parameters Review of patient status, including review of consultants reports, relevant laboratory and other test results, and medications completed Lab Results  Component Value Date   HGBA1C 7.4 (H) 02/13/2023              Our next appointment is by telephone on 06/18/23 at 1:30 PM  Please call the care guide team at [phone] if you need to cancel or reschedule your appointment.   If you are experiencing a Mental Health or Behavioral Health Crisis or need someone to talk to, please call 1-800-273-TALK (toll free, 24 hour hotline)  Patient verbalizes understanding of instructions and care plan provided today and agrees to view in MyChart. Active MyChart status and patient understanding of how to access instructions and care plan via MyChart confirmed with patient.     Delsa Sale RN BSN CCM [place]  [facility], [facility] Nurse Care Coordinator  Direct Dial: [phone] Website: [email or web address]

## 2023-04-16 NOTE — Patient Outreach (Signed)
 Care Coordination   Follow Up Visit Note   04/15/2023 Name: Donna Gilmore MRN: [id number] DOB: 1940/02/06  Donna Gilmore is a 83 y.o. year old female who sees Donna Felts, FNP for primary care. I spoke with  Donna Gilmore by phone today.  What matters to the patients health and wellness today?  Patient would like to complete her cardiac testing and continue to manage her diabetes.     Goals Addressed             This Visit's Progress    To improve fatigue and strengthening   On track    Care Coordination Interventions: Evaluation of current treatment plan related to atypical chest pain/fatigue and patient's adherence to plan as established by provider Determined patient completed a follow up visit with Dr. Rennis Gilmore, Cardiologist for evaluation of atypical chest pain and fatigue  Review of patient status, including review of consultant's reports, relevant laboratory and other test results, and medications completed Reviewed and discussed with patient her upcoming scheduled Myocardial Perfusion test scheduled for 04/17/23 @10 :15 AM Educated patient about this test and what to expect  Discussed plans with patient for ongoing care coordination follow up and provided patient with direct contact information for nurse care coordinator Scheduled nurse follow up call with patient for 06/18/23 @1 :30 PM     To improve kidney function   On track    Care Coordination Interventions: Assessed the Patient understanding of chronic kidney disease    Evaluation of current treatment plan related to chronic kidney disease self management and patient's adherence to plan as established by provider      Reviewed prescribed diet increase daily water intake to 48-64 oz daily unless otherwise directed     Last practice recorded BP readings:  BP Readings from Last 3 Encounters:  04/11/23 118/70  03/31/23 110/70  03/04/23 130/78   Most recent eGFR/CrCl:  Lab Results  Component Value Date   EGFR 28  (L) 02/13/2023    No components found for: "CRCL"      To work on lowering A1c <7.0 %   On track    Care Coordination Interventions: Provided education to patient about basic DM disease process Reviewed medications with patient and discussed importance of medication adherence Provided patient with written educational materials related to hypo and hyperglycemia and importance of correct treatment Advised patient, providing education and rationale, to check cbg daily before meals and at bedtime and record, calling PCP for findings outside established parameters Review of patient status, including review of consultants reports, relevant laboratory and other test results, and medications completed Lab Results  Component Value Date   HGBA1C 7.4 (H) 02/13/2023     Interventions Today    Flowsheet Row Most Recent Value  Chronic Disease   Chronic disease during today's visit Diabetes, Other  [fatigue]  General Interventions   General Interventions Discussed/Reviewed General Interventions Discussed, General Interventions Reviewed, Doctor Visits, Labs, Durable Medical Equipment (DME)  Doctor Visits Discussed/Reviewed Doctor Visits Discussed, Doctor Visits Reviewed, PCP, Specialist  Durable Medical Equipment (DME) Glucomoter  Exercise Interventions   Exercise Discussed/Reviewed Physical Activity  Physical Activity Discussed/Reviewed Physical Activity Reviewed  Education Interventions   Education Provided Provided Education  Provided Verbal Education On When to see the doctor, Blood Sugar Monitoring, Nutrition, Medication  Nutrition Interventions   Nutrition Discussed/Reviewed Nutrition Discussed, Nutrition Reviewed, Fluid intake  Pharmacy Interventions   Pharmacy Dicussed/Reviewed Pharmacy Topics Discussed, Pharmacy Topics Reviewed, Medications and their functions  SDOH assessments and interventions completed:  No     Care Coordination Interventions:  Yes, provided    Follow up plan: Follow up call scheduled for 06/18/23 @1 :30 PM    Encounter Outcome:  Patient Visit Completed

## 2023-04-17 ENCOUNTER — Ambulatory Visit (HOSPITAL_COMMUNITY): Attending: Internal Medicine

## 2023-04-17 DIAGNOSIS — I10 Essential (primary) hypertension: Secondary | ICD-10-CM | POA: Insufficient documentation

## 2023-04-17 DIAGNOSIS — R5383 Other fatigue: Secondary | ICD-10-CM | POA: Diagnosis not present

## 2023-04-17 DIAGNOSIS — R0789 Other chest pain: Secondary | ICD-10-CM | POA: Insufficient documentation

## 2023-04-17 DIAGNOSIS — E785 Hyperlipidemia, unspecified: Secondary | ICD-10-CM | POA: Insufficient documentation

## 2023-04-17 DIAGNOSIS — E119 Type 2 diabetes mellitus without complications: Secondary | ICD-10-CM | POA: Insufficient documentation

## 2023-04-17 LAB — MYOCARDIAL PERFUSION IMAGING
LV dias vol: 35 mL (ref 46–106)
LV sys vol: 8 mL
Nuc Stress EF: 78 %
Peak HR: 72 {beats}/min
Rest HR: 57 {beats}/min
Rest Nuclear Isotope Dose: 8.7 mCi
SDS: 0
SRS: 0
SSS: 0
ST Depression (mm): 0 mm
Stress Nuclear Isotope Dose: 25.6 mCi
TID: 1.06

## 2023-04-17 MED ORDER — TECHNETIUM TC 99M TETROFOSMIN IV KIT
8.7000 | PACK | Freq: Once | INTRAVENOUS | Status: AC | PRN
  Administered 2023-04-17: 8.7 via INTRAVENOUS

## 2023-04-17 MED ORDER — TECHNETIUM TC 99M TETROFOSMIN IV KIT
25.6000 | PACK | Freq: Once | INTRAVENOUS | Status: AC | PRN
  Administered 2023-04-17: 25.6 via INTRAVENOUS

## 2023-04-17 MED ORDER — REGADENOSON 0.4 MG/5ML IV SOLN
0.4000 mg | Freq: Once | INTRAVENOUS | Status: AC
  Administered 2023-04-17: 0.4 mg via INTRAVENOUS

## 2023-04-22 ENCOUNTER — Other Ambulatory Visit: Payer: Self-pay | Admitting: Nurse Practitioner

## 2023-04-24 ENCOUNTER — Telehealth: Payer: Self-pay | Admitting: *Deleted

## 2023-04-24 NOTE — Progress Notes (Signed)
 Complex Care Management Care Guide Note  04/24/2023 Name: Donna Gilmore MRN: [id number] DOB: 1940/02/26  [name] is a 83 y.o. year old female who is a primary care patient of Arnette Felts, FNP and is actively engaged with the care management team. I reached out to Willadean Carol by phone today to assist with scheduling  with the RN Case Manager.  Follow up plan: Unsuccessful telephone outreach attempt made. A HIPAA compliant phone message was left for the patient providing contact information and requesting a return call.  Gwenevere Ghazi  [facility]  Value-Based Care Institute, [facility] Guide  Direct Dial: [phone]  Fax [phone]

## 2023-04-25 NOTE — Progress Notes (Signed)
 Complex Care Management Note Care Guide Note  04/25/2023 Name: Donna Gilmore MRN: [id number] DOB: [birth year]   Complex Care Management Outreach Attempts: A second unsuccessful outreach was attempted today to offer the patient with information about available complex care management services.  Follow Up Plan:  Additional outreach attempts will be made to offer the patient complex care management information and services.   Encounter Outcome:  No Answer  Gwenevere Ghazi  [facility]  [facility], [facility] Guide  Direct Dial: [phone]  Fax [phone]

## 2023-05-03 ENCOUNTER — Other Ambulatory Visit: Payer: Self-pay | Admitting: Nurse Practitioner

## 2023-05-03 DIAGNOSIS — Z8709 Personal history of other diseases of the respiratory system: Secondary | ICD-10-CM

## 2023-05-05 ENCOUNTER — Ambulatory Visit: Payer: Self-pay

## 2023-05-05 ENCOUNTER — Ambulatory Visit: Admitting: Nurse Practitioner

## 2023-05-05 ENCOUNTER — Encounter: Payer: Self-pay | Admitting: Nurse Practitioner

## 2023-05-05 VITALS — BP 130/70 | HR 71 | Temp 98.7°F | Ht 64.0 in | Wt 194.2 lb

## 2023-05-05 DIAGNOSIS — E782 Mixed hyperlipidemia: Secondary | ICD-10-CM | POA: Diagnosis not present

## 2023-05-05 DIAGNOSIS — M5442 Lumbago with sciatica, left side: Secondary | ICD-10-CM | POA: Insufficient documentation

## 2023-05-05 DIAGNOSIS — E1122 Type 2 diabetes mellitus with diabetic chronic kidney disease: Secondary | ICD-10-CM | POA: Diagnosis not present

## 2023-05-05 DIAGNOSIS — N1832 Chronic kidney disease, stage 3b: Secondary | ICD-10-CM

## 2023-05-05 DIAGNOSIS — Z2821 Immunization not carried out because of patient refusal: Secondary | ICD-10-CM

## 2023-05-05 DIAGNOSIS — I129 Hypertensive chronic kidney disease with stage 1 through stage 4 chronic kidney disease, or unspecified chronic kidney disease: Secondary | ICD-10-CM

## 2023-05-05 DIAGNOSIS — E66811 Obesity, class 1: Secondary | ICD-10-CM

## 2023-05-05 DIAGNOSIS — E6609 Other obesity due to excess calories: Secondary | ICD-10-CM

## 2023-05-05 DIAGNOSIS — Z6833 Body mass index (BMI) 33.0-33.9, adult: Secondary | ICD-10-CM

## 2023-05-05 MED ORDER — TRIAMCINOLONE ACETONIDE 40 MG/ML IJ SUSP
40.0000 mg | Freq: Once | INTRAMUSCULAR | Status: AC
  Administered 2023-05-05: 40 mg via INTRAMUSCULAR

## 2023-05-05 NOTE — Progress Notes (Signed)
 Complex Care Management Care Guide Note  05/05/2023 Name: [name] MRN: [id number] DOB: 1940-05-21  Donna Gilmore is a 83 y.o. year old female who is a primary care patient of Arnette Felts, FNP and is actively engaged with the care management team. I reached out to Willadean Carol by phone today to assist with re-scheduling  with the RN Case Manager.  Follow up plan: Unsuccessful telephone outreach attempt made. A HIPAA compliant phone message was left for the patient providing contact information and requesting a return call. No further outreach attempts will be made at this time. We have been unable to contact the patient to reschedule for complex care management services.   Gwenevere Ghazi  [facility]  Value-Based Care Institute, [facility] Guide  Direct Dial: [phone]  Fax [phone]

## 2023-05-05 NOTE — Telephone Encounter (Signed)
  Chief Complaint: worsening lower back pain down leg to foot Symptoms: severe pain, left leg weakness and numbness  Frequency: worsened yesterday  Pertinent Negatives: Patient denies incontinence Disposition: [] ED /[] Urgent Care (no appt availability in office) / [x] Appointment(In office/virtual)/ []  [facility]/ [] Home Care/ [] Refused Recommended Disposition /[] [facility]/ []  Follow-up with PCP Additional Notes: can pt get worked in today? Call son Oswaldo Done on his cell phone. Pt has a heart doc. Appt today at 1:45 FYI Copied from CRM [id number]. Topic: Clinical - Red Word Triage >> May 05, 2023  9:56 AM Bridgette M wrote: [facility] that prompted transfer to Nurse Triage: pain from lower back to leg/foot, left side, sharp, really painful started yesterday afternoon Reason for Disposition  Numbness in a leg or foot (i.e., loss of sensation)  Answer Assessment - Initial Assessment Questions 1. ONSET: "When did the pain begin?"      Chrnic worsened yesterday  2. LOCATION: "Where does it hurt?" (upper, mid or lower back)     Lower back and pain down left leg and foot  3. SEVERITY: "How bad is the pain?"  (e.g., Scale 1-10; mild, moderate, or severe)   - MILD (1-3): Doesn't interfere with normal activities.    - MODERATE (4-7): Interferes with normal activities or awakens from sleep.    - SEVERE (8-10): Excruciating pain, unable to do any normal activities.      severe 4. PATTERN: "Is the pain constant?" (e.g., yes, no; constant, intermittent)      yes 5. RADIATION: "Does the pain shoot into your legs or somewhere else?"     Dwn left leg and to left foot 6. CAUSE:  "What do you think is causing the back pain?"      Chronic   8. MEDICINES: "What have you taken so far for the pain?" (e.g., nothing, acetaminophen, NSAIDS)     Tylenol and heat  9. NEUROLOGIC SYMPTOMS: "Do you have any weakness, numbness, or problems with bowel/bladder control?"     Numbness, weakness  numbness  10. OTHER SYMPTOMS: "Do you have any other symptoms?" (e.g., fever, abdomen pain, burning with urination, blood in urine)       none  Protocols used: Back Pain-A-AH

## 2023-05-05 NOTE — Progress Notes (Signed)
 [place] Favia, CMA,acting as a Neurosurgeon for Susanna Epley, FNP.,have documented all relevant documentation on the behalf of Susanna Epley, FNP,as directed by  Susanna Epley, FNP while in the presence of Susanna Epley, FNP.  Subjective:  Patient ID: Donna Gilmore , female    DOB: 28-Apr-1940 , 83 y.o.   MRN: [id number]  Chief Complaint  Patient presents with   Hypertension    HPI  Patient presents today for lower back pain that shoots down her left leg. Patient reports this first started yesterday she reports it first started in her left leg. Patient reports today her pain has moved to her left arm. She took 3 tylenol and used a heating pad. She tried to change the bed. Her numbness and tingling worsened. She also is having pain left shoulder. Patient would also like to her BP and DM check.     Past Medical History:  Diagnosis Date   Acute left-sided low back pain with left-sided sciatica 05/04/2018   Benign paroxysmal positional vertigo 08/26/2012   Chest pain 09/21/2012   CVA (cerebral infarction) 08/25/2012   Diabetes mellitus without complication (HCC)    Dyslipidemia 08/25/2012   Essential hypertension, benign 08/25/2012   Hyperlipidemia    Hypertension    Meralgia paraesthetica, left 01/12/2018   Stroke (HCC)      Family History  Problem Relation Age of Onset   Kidney failure Mother    Diabetes Mother    Alzheimer's disease Mother    Alzheimer's disease Father    Prostate cancer Father    Diabetes Maternal Grandmother    Diabetes Maternal Grandfather    Alzheimer's disease Paternal Grandmother    Alzheimer's disease Paternal Grandfather    Prostate cancer Paternal Uncle      Current Outpatient Medications:    acetaminophen (TYLENOL) 500 MG tablet, Take 1 tablet (500 mg total) by mouth every 6 (six) hours as needed., Disp: 30 tablet, Rfl: 0   albuterol (VENTOLIN HFA) 108 (90 Base) MCG/ACT inhaler, Inhale 2 puffs into the lungs every 6 (six) hours as needed for wheezing  or shortness of breath., Disp: 8.5 g, Rfl: 2   atorvastatin (LIPITOR) 80 MG tablet, TAKE 1 TABLET BY MOUTH ONCE  DAILY, Disp: 100 tablet, Rfl: 2   cholecalciferol (VITAMIN D3) 25 MCG (1000 UNIT) tablet, Take 1,000 Units by mouth daily., Disp: , Rfl:    clopidogrel (PLAVIX) 75 MG tablet, TAKE 1 TABLET BY MOUTH DAILY, Disp: 100 tablet, Rfl: 2   COVID-19 At-Home Test KIT, 1 each by In Vitro route as needed., Disp: 1 kit, Rfl: 2   dapagliflozin propanediol (FARXIGA) 10 MG TABS tablet, Take 1 tablet (10 mg total) by mouth daily before breakfast., Disp: , Rfl:    desonide (DESOWEN) 0.05 % ointment, Apply 1 Application topically 2 (two) times daily. for itchy rash, Disp: 60 g, Rfl: 5   diclofenac Sodium (VOLTAREN) 1 % GEL, Apply topically 4 (four) times daily., Disp: , Rfl:    donepezil (ARICEPT) 10 MG tablet, Take 1 tablet (10 mg total) by mouth at bedtime., Disp: 100 tablet, Rfl: 2   ezetimibe (ZETIA) 10 MG tablet, Take 1 tablet (10 mg total) by mouth daily., Disp: 100 tablet, Rfl: 3   gabapentin (NEURONTIN) 100 MG capsule, TAKE 1 CAPSULE BY MOUTH 3 TIMES  DAILY AS NEEDED, Disp: 300 capsule, Rfl: 2   glucose blood (ONETOUCH ULTRA) test strip, USE WITH METER TO CHECK BLOOD SUGAR BEFORE BREAKFAST AND BEFORE DINNER, Disp: 200 strip, Rfl: 2  glucose blood (ONETOUCH ULTRA) test strip, USE AS DIRECTED WITH  METER  TO  CHECK  BLOOD  SUGAR  BEFORE  BREAKFAST  AND  DINNER, Disp: 100 each, Rfl: 3   levocetirizine (XYZAL) 5 MG tablet, Take 1 tablet (5 mg total) by mouth every evening., Disp: 30 tablet, Rfl: 11   mometasone (NASONEX) 50 MCG/ACT nasal spray, Place 2 sprays into the nose daily., Disp: 1 each, Rfl: 2   olmesartan (BENICAR) 20 MG tablet, Take 1 tablet (20 mg total) by mouth daily., Disp: 90 tablet, Rfl: 1   vitamin C (ASCORBIC ACID) 500 MG tablet, Take 500 mg by mouth daily., Disp: , Rfl:    VITAMIN D PO, Take 1 tablet by mouth daily., Disp: , Rfl:    BREZTRI AEROSPHERE 160-9-4.8 MCG/ACT AERO inhaler,  USE 2 INHALATIONS BY MOUTH DAILY, Disp: 21.4 g, Rfl: 2   pantoprazole (PROTONIX) 40 MG tablet, TAKE 1 TABLET BY MOUTH DAILY, Disp: 100 tablet, Rfl: 2  Current Facility-Administered Medications:    0.9 %  sodium chloride infusion, 500 mL, Intravenous, Once, Tobin Forts, MD   No Known Allergies   Review of Systems  Constitutional: Negative.   HENT:  Positive for hearing loss (wearing hearing aids).   Eyes: Negative.   Respiratory: Negative.    Cardiovascular: Negative.   Gastrointestinal: Negative.   Genitourinary: Negative.   Allergic/Immunologic: Negative.   Neurological: Negative.   Psychiatric/Behavioral: Negative.       Today's Vitals   05/05/23 1612  BP: 130/70  Pulse: 71  Temp: 98.7 F (37.1 C)  TempSrc: Oral  Weight: 194 lb 3.2 oz (88.1 kg)  Height: 5\' 4"  (1.626 m)  PainSc: 10-Worst pain ever  PainLoc: Leg   Body mass index is 33.33 kg/m.  Wt Readings from Last 3 Encounters:  05/05/23 194 lb 3.2 oz (88.1 kg)  04/17/23 191 lb (86.6 kg)  04/11/23 191 lb (86.6 kg)     Objective:  Physical Exam Vitals and nursing note reviewed.  Constitutional:      General: She is not in acute distress.    Appearance: Normal appearance. She is obese.  HENT:     Ears:     Comments: Hard of hearing Cardiovascular:     Rate and Rhythm: Normal rate and regular rhythm.     Pulses: Normal pulses.     Heart sounds: Normal heart sounds. No murmur heard. Pulmonary:     Effort: Pulmonary effort is normal. No respiratory distress.     Breath sounds: Normal breath sounds. No wheezing.  Musculoskeletal:        General: Normal range of motion.  Skin:    General: Skin is warm and dry.     Capillary Refill: Capillary refill takes less than 2 seconds.  Neurological:     General: No focal deficit present.     Mental Status: She is alert and oriented to person, place, and time.     Cranial Nerves: No cranial nerve deficit.     Motor: No weakness.  Psychiatric:        Mood and  Affect: Mood normal.        Behavior: Behavior normal.        Thought Content: Thought content normal.        Judgment: Judgment normal.         Assessment And Plan:  Acute left-sided low back pain with left-sided sciatica Assessment & Plan: She has pain with leg raise but does not describe it  as sharp pain.  Will treat with Kenalog injection.  Would like to avoid steroid pills due to having diabetes.  I have given her some exercises as well.  Return call to office if not better.  Orders: -     Triamcinolone Acetonide  Type 2 diabetes mellitus with stage 3b chronic kidney disease, without long-term current use of insulin (HCC) Assessment & Plan: She is now on Farxiga and tolerating well.  Will check her hemoglobin A1c.  Orders: -     Hemoglobin A1c  Hypertensive nephropathy -     BMP8+eGFR  Mixed hyperlipidemia Assessment & Plan: Cholesterol levels are stable. Continue current medications.   Orders: -     Lipid panel  COVID-19 vaccination declined Assessment & Plan: Declines covid 19 vaccine. Discussed risk of covid 44 and if she changes her mind about the vaccine to call the office. Education has been provided regarding the importance of this vaccine but patient still declined. Advised may receive this vaccine at local pharmacy or Health Dept.or vaccine clinic. Aware to provide a copy of the vaccination record if obtained from local pharmacy or Health Dept.  Encouraged to take multivitamin, vitamin d, vitamin c and zinc to increase immune system. Aware can call office if would like to have vaccine here at office. Verbalized acceptance and understanding. She is also sick currently   Class 1 obesity due to excess calories with body mass index (BMI) of 33.0 to 33.9 in adult, unspecified whether serious comorbidity present Assessment & Plan: She is encouraged to strive for BMI less than 30 to decrease cardiac risk. Advised to aim for at least 150 minutes of exercise per  week.      No follow-ups on file.  Patient was given opportunity to ask questions. Patient verbalized understanding of the plan and was able to repeat key elements of the plan. All questions were answered to their satisfaction.    Inge Mangle, FNP, have reviewed all documentation for this visit. The documentation on 05/05/23 for the exam, diagnosis, procedures, and orders are all accurate and complete.   IF YOU HAVE BEEN REFERRED TO A SPECIALIST, IT MAY TAKE 1-2 WEEKS TO SCHEDULE/PROCESS THE REFERRAL. IF YOU HAVE NOT HEARD FROM US /SPECIALIST IN TWO WEEKS, PLEASE GIVE US  A CALL AT [phone].

## 2023-05-06 ENCOUNTER — Ambulatory Visit: Payer: Self-pay | Admitting: Internal Medicine

## 2023-05-06 LAB — HEMOGLOBIN A1C
Est. average glucose Bld gHb Est-mCnc: 186 mg/dL
Hgb A1c MFr Bld: 8.1 % — ABNORMAL HIGH (ref 4.8–5.6)

## 2023-05-06 LAB — BMP8+EGFR
BUN/Creatinine Ratio: 14 (ref 12–28)
BUN: 24 mg/dL (ref 8–27)
CO2: 21 mmol/L (ref 20–29)
Calcium: 9.8 mg/dL (ref 8.7–10.3)
Chloride: 106 mmol/L (ref 96–106)
Creatinine, Ser: 1.76 mg/dL — ABNORMAL HIGH (ref 0.57–1.00)
Glucose: 120 mg/dL — ABNORMAL HIGH (ref 70–99)
Potassium: 4.2 mmol/L (ref 3.5–5.2)
Sodium: 145 mmol/L — ABNORMAL HIGH (ref 134–144)
eGFR: 28 mL/min/{1.73_m2} — ABNORMAL LOW (ref >59)

## 2023-05-06 LAB — LIPID PANEL
Chol/HDL Ratio: 3.1 ratio (ref 0.0–4.4)
Cholesterol, Total: 159 mg/dL (ref 100–199)
HDL: 51 mg/dL (ref >39)
LDL Chol Calc (NIH): 72 mg/dL (ref 0–99)
Triglycerides: 217 mg/dL — ABNORMAL HIGH (ref 0–149)
VLDL Cholesterol Cal: 36 mg/dL (ref 5–40)

## 2023-05-07 ENCOUNTER — Other Ambulatory Visit: Payer: Self-pay | Admitting: Chiropractor

## 2023-05-07 ENCOUNTER — Ambulatory Visit
Admission: RE | Admit: 2023-05-07 | Discharge: 2023-05-07 | Disposition: A | Discharge status: Home / Self Care | Source: Ambulatory Visit | Attending: Chiropractor | Admitting: Chiropractor

## 2023-05-07 ENCOUNTER — Encounter: Payer: Self-pay | Admitting: Nurse Practitioner

## 2023-05-07 DIAGNOSIS — G8929 Other chronic pain: Secondary | ICD-10-CM

## 2023-05-07 DIAGNOSIS — M545 Low back pain, unspecified: Secondary | ICD-10-CM

## 2023-05-07 DIAGNOSIS — M5416 Radiculopathy, lumbar region: Secondary | ICD-10-CM | POA: Diagnosis not present

## 2023-05-08 ENCOUNTER — Ambulatory Visit: Payer: Self-pay | Admitting: Nurse Practitioner

## 2023-05-11 ENCOUNTER — Other Ambulatory Visit: Payer: Self-pay | Admitting: Family Medicine

## 2023-05-11 DIAGNOSIS — R12 Heartburn: Secondary | ICD-10-CM

## 2023-05-13 ENCOUNTER — Other Ambulatory Visit: Payer: Self-pay | Admitting: Nurse Practitioner

## 2023-05-13 DIAGNOSIS — M542 Cervicalgia: Secondary | ICD-10-CM | POA: Diagnosis not present

## 2023-05-13 DIAGNOSIS — M5442 Lumbago with sciatica, left side: Secondary | ICD-10-CM

## 2023-05-13 NOTE — Assessment & Plan Note (Signed)
 She has pain with leg raise but does not describe it as sharp pain.  Will treat with Kenalog injection.  Would like to avoid steroid pills due to having diabetes.  I have given her some exercises as well.  Return call to office if not better.

## 2023-05-13 NOTE — Assessment & Plan Note (Signed)
 She is now on [name] and tolerating well.  Will check her hemoglobin A1c.

## 2023-05-13 NOTE — Assessment & Plan Note (Signed)
 Cholesterol levels are stable. Continue current medications

## 2023-05-13 NOTE — Assessment & Plan Note (Signed)
 She is encouraged to strive for BMI less than 30 to decrease cardiac risk. Advised to aim for at least 150 minutes of exercise per week.

## 2023-05-13 NOTE — Assessment & Plan Note (Signed)
 Declines covid 19 vaccine. Discussed risk of covid 31 and if she changes her mind about the vaccine to call the office. Education has been provided regarding the importance of this vaccine but patient still declined. Advised may receive this vaccine at local pharmacy or Health Dept.or vaccine clinic. Aware to provide a copy of the vaccination record if obtained from local pharmacy or Health Dept.  Encouraged to take multivitamin, vitamin d, vitamin c and zinc to increase immune system. Aware can call office if would like to have vaccine here at office. Verbalized acceptance and understanding. She is also sick currently

## 2023-05-14 DIAGNOSIS — M549 Dorsalgia, unspecified: Secondary | ICD-10-CM | POA: Diagnosis not present

## 2023-05-14 DIAGNOSIS — Z8673 Personal history of transient ischemic attack (TIA), and cerebral infarction without residual deficits: Secondary | ICD-10-CM | POA: Diagnosis not present

## 2023-05-14 DIAGNOSIS — E119 Type 2 diabetes mellitus without complications: Secondary | ICD-10-CM | POA: Diagnosis not present

## 2023-05-16 ENCOUNTER — Other Ambulatory Visit: Payer: Self-pay | Admitting: Physical Medicine and Rehabilitation

## 2023-05-16 DIAGNOSIS — M542 Cervicalgia: Secondary | ICD-10-CM

## 2023-05-26 DIAGNOSIS — M9903 Segmental and somatic dysfunction of lumbar region: Secondary | ICD-10-CM | POA: Diagnosis not present

## 2023-05-26 DIAGNOSIS — M9902 Segmental and somatic dysfunction of thoracic region: Secondary | ICD-10-CM | POA: Diagnosis not present

## 2023-05-26 DIAGNOSIS — M9901 Segmental and somatic dysfunction of cervical region: Secondary | ICD-10-CM | POA: Diagnosis not present

## 2023-05-26 DIAGNOSIS — M50122 Cervical disc disorder at C5-C6 level with radiculopathy: Secondary | ICD-10-CM | POA: Diagnosis not present

## 2023-05-30 ENCOUNTER — Other Ambulatory Visit

## 2023-05-30 ENCOUNTER — Ambulatory Visit
Admission: RE | Admit: 2023-05-30 | Discharge: 2023-05-30 | Disposition: A | Discharge status: Home / Self Care | Source: Ambulatory Visit | Attending: Physical Medicine and Rehabilitation | Admitting: Physical Medicine and Rehabilitation

## 2023-05-30 DIAGNOSIS — M542 Cervicalgia: Secondary | ICD-10-CM

## 2023-05-30 DIAGNOSIS — M4802 Spinal stenosis, cervical region: Secondary | ICD-10-CM | POA: Diagnosis not present

## 2023-06-04 ENCOUNTER — Other Ambulatory Visit: Payer: Self-pay

## 2023-06-04 ENCOUNTER — Ambulatory Visit (INDEPENDENT_AMBULATORY_CARE_PROVIDER_SITE_OTHER): Payer: Medicare Other | Admitting: Allergy

## 2023-06-04 ENCOUNTER — Encounter: Payer: Self-pay | Admitting: Allergy

## 2023-06-04 VITALS — BP 130/80 | HR 76 | Temp 98.1°F | Resp 17 | Ht 61.81 in | Wt 188.2 lb

## 2023-06-04 DIAGNOSIS — H01131 Eczematous dermatitis of right upper eyelid: Secondary | ICD-10-CM

## 2023-06-04 DIAGNOSIS — J302 Other seasonal allergic rhinitis: Secondary | ICD-10-CM

## 2023-06-04 DIAGNOSIS — H01135 Eczematous dermatitis of left lower eyelid: Secondary | ICD-10-CM

## 2023-06-04 DIAGNOSIS — J3089 Other allergic rhinitis: Secondary | ICD-10-CM | POA: Diagnosis not present

## 2023-06-04 DIAGNOSIS — H1013 Acute atopic conjunctivitis, bilateral: Secondary | ICD-10-CM

## 2023-06-04 DIAGNOSIS — H01134 Eczematous dermatitis of left upper eyelid: Secondary | ICD-10-CM

## 2023-06-04 DIAGNOSIS — H01132 Eczematous dermatitis of right lower eyelid: Secondary | ICD-10-CM | POA: Diagnosis not present

## 2023-06-04 MED ORDER — LORATADINE 10 MG PO TABS: 10.0000 mg | ORAL_TABLET | Freq: Every day | ORAL | 5 refills | Status: DC

## 2023-06-04 MED ORDER — OLOPATADINE HCL 0.6 % NA SOLN: 2.0000 | Freq: Two times a day (BID) | NASAL | 5 refills | Status: AC | PRN

## 2023-06-04 NOTE — Patient Instructions (Addendum)
-   Continue avoidance measures for dust mites and cockroach.  - Air purifiers help to purify the air you breathe in and reduce your allergen load in the home.  - use Patanase  (Olopatadine ) nasal spray 2 sprays each nostril twice a day as needed for runny/itchy nose - use Claritin  1-2 tabs a day to help control allergy symptoms and itch.  If using Claritin  do not take Xyzal  - for itchy eyes use Pataday  1 drop each eye daily as needed  Follow-up in 6 months or sooner if needed

## 2023-06-04 NOTE — Progress Notes (Signed)
 Follow-up Note  RE: Donna Gilmore MRN: [id number] DOB: 1941/01/03 Date of Office Visit: 06/04/2023   History of present illness: Donna Gilmore is a 83 y.o. female presenting today for follow-up of allergic rhinitis with conjunctivitis and dermatitis.  She was last seen in the office on 12/05/2022 by myself.  She presents today with her son. Discussed the use of AI scribe software for clinical note transcription with the patient, who gave verbal consent to proceed.  She has experienced some improvement in her nasal allergy symptoms, including rhinorrhea and nasal congestion, since November, but they fluctuate and are often worse in the evenings. She uses a nasal spray twice a day, in the morning and at night, but notes that it is not a prescription spray and starts with an 'N'.  Looking at her medication list this appears to be Nasonex .  She states she does not have a nasal spray called olopatadine .  She continues to take Claritin  and feels it helps with her symptoms. However, she still experiences significant rhinorrhea and pruritus, which she attributes to her allergies.   She experiences severe pruritus of her eyes, particularly upon waking in the morning. She has tried various eye drops but finds them ineffective currently using Al[place] . She is concerned about the impact of scratching on her eyesight. She has not used Pataday  before she does not recall.    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  Review of systems: 10pt ROS negative unless noted above in HPI  Past medical/social/surgical/family history have been reviewed and are unchanged unless specifically indicated below.  No changes  Medication List: Current Outpatient Medications  Medication Sig Dispense Refill   acetaminophen  (TYLENOL ) 500 MG tablet Take 1 tablet (500 mg total) by mouth every 6 (six) hours as needed. 30 tablet 0   albuterol  (VENTOLIN  HFA) 108 (90 Base) MCG/ACT inhaler  Inhale 2 puffs into the lungs every 6 (six) hours as needed for wheezing or shortness of breath. 8.5 g 2   BREZTRI  AEROSPHERE 160-9-4.8 MCG/ACT AERO inhaler USE 2 INHALATIONS BY MOUTH DAILY 21.4 g 2   cholecalciferol (VITAMIN D3) 25 MCG (1000 UNIT) tablet Take 1,000 Units by mouth daily.     clopidogrel  (PLAVIX ) 75 MG tablet TAKE 1 TABLET BY MOUTH DAILY 100 tablet 2   dapagliflozin  propanediol (FARXIGA ) 10 MG TABS tablet Take 1 tablet (10 mg total) by mouth daily before breakfast.     donepezil  (ARICEPT ) 10 MG tablet Take 1 tablet (10 mg total) by mouth at bedtime. 100 tablet 2   gabapentin  (NEURONTIN ) 100 MG capsule TAKE 1 CAPSULE BY MOUTH 3 TIMES  DAILY AS NEEDED 300 capsule 2   glucose blood (ONETOUCH ULTRA) test strip USE WITH METER TO CHECK BLOOD SUGAR BEFORE BREAKFAST AND BEFORE DINNER 200 strip 2   glucose blood (ONETOUCH ULTRA) test strip USE AS DIRECTED WITH  METER  TO  CHECK  BLOOD  SUGAR  BEFORE  BREAKFAST  AND  DINNER 100 each 3   mometasone  (NASONEX ) 50 MCG/ACT nasal spray Place 2 sprays into the nose daily. 1 each 2   olmesartan  (BENICAR ) 20 MG tablet Take 1 tablet (20 mg total) by mouth daily. 90 tablet 1   pantoprazole  (PROTONIX ) 40 MG tablet TAKE 1 TABLET BY MOUTH DAILY 100 tablet 2   vitamin C  (ASCORBIC ACID ) 500 MG tablet Take 500 mg by mouth daily.     VITAMIN D  PO Take 1 tablet by mouth daily.  atorvastatin  (LIPITOR) 80 MG tablet TAKE 1 TABLET BY MOUTH ONCE  DAILY (Patient not taking: Reported on 06/04/2023) 100 tablet 2   COVID-19 At-Home Test KIT 1 each by In Vitro route as needed. 1 kit 2   desonide (DESOWEN) 0.05 % ointment Apply 1 Application topically 2 (two) times daily. for itchy rash (Patient not taking: Reported on 06/04/2023) 60 g 5   diclofenac Sodium (VOLTAREN) 1 % GEL Apply topically 4 (four) times daily. (Patient not taking: Reported on 06/04/2023)     ezetimibe  (ZETIA ) 10 MG tablet Take 1 tablet (10 mg total) by mouth daily. (Patient not taking: Reported on  06/04/2023) 100 tablet 3   levocetirizine (XYZAL ) 5 MG tablet Take 1 tablet (5 mg total) by mouth every evening. (Patient not taking: Reported on 06/04/2023) 30 tablet 11   Current Facility-Administered Medications  Medication Dose Route Frequency Provider Last Rate Last Admin   0.9 %  sodium chloride  infusion  500 mL Intravenous Once Tobin Forts, MD         Known medication allergies: No Known Allergies   Physical examination: Blood pressure 130/80, pulse 76, temperature 98.1 F (36.7 C), temperature source Temporal, resp. rate 17, height 5' 1.81" (1.57 m), weight 188 lb 3.2 oz (85.4 kg), SpO2 95%.  General: Alert, interactive, in no acute distress. HEENT: PERRLA, TMs pearly gray, turbinates minimally edematous with clear discharge, post-pharynx non erythematous. Neck: Supple without lymphadenopathy. Lungs: Clear to auscultation without wheezing, rhonchi or rales. {no increased work of breathing. CV: Normal S1, S2 without murmurs. Abdomen: Nondistended, nontender. Skin: Warm and dry, without lesions or rashes. Extremities:  No clubbing, cyanosis or edema. Neuro:   Grossly intact.  Diagnositics/Labs: None today  Assessment and plan: Allergic rhinitis with conjunctivitis Eyelid dermatitis   - Continue avoidance measures for dust mites and cockroach.  - Air purifiers help to purify the air you breathe in and reduce your allergen load in the home.  - use Patanase  (Olopatadine ) nasal spray 2 sprays each nostril twice a day as needed for runny/itchy nose - use Claritin  1-2 tabs a day to help control allergy symptoms and itch.  If using Claritin  do not take Xyzal  - for itchy eyes use Pataday  1 drop each eye daily as needed  Follow-up in 6 months or sooner if needed  I appreciate the opportunity to take part in Donna Gilmore's care. Please do not hesitate to contact me with questions.  Sincerely,   Donna Clink, MD Allergy/Immunology [facility]

## 2023-06-09 ENCOUNTER — Telehealth: Payer: Self-pay

## 2023-06-09 ENCOUNTER — Other Ambulatory Visit: Payer: Self-pay | Admitting: Nurse Practitioner

## 2023-06-09 ENCOUNTER — Ambulatory Visit: Admitting: Physical Medicine and Rehabilitation

## 2023-06-09 MED ORDER — EZETIMIBE 10 MG PO TABS: 10.0000 mg | ORAL_TABLET | Freq: Every day | ORAL | 1 refills | Status: DC

## 2023-06-09 NOTE — Telephone Encounter (Signed)
 Copied from CRM [id number]. Topic: Clinical - Medical Advice >> Jun 06, 2023  1:13 PM Leory Rands wrote: Reason for CRM:  [name] (son) [phone] is calling to ask if the patient would qualify for acupuncture.  LVM for Mr.Smith informed him that he can just call and schedule a acupuncture appointment. Patient was advised to call insurance to see if her insurance will cover it.

## 2023-06-09 NOTE — Telephone Encounter (Signed)
 Last Fill: 11/25/22  Last OV: 02/13/23 Next OV: 10/16/23  Routing to provider for review/authorization.

## 2023-06-09 NOTE — Telephone Encounter (Signed)
 Copied from CRM [id number]. Topic: Clinical - Medication Refill >> Jun 09, 2023  2:03 PM Zipporah Him wrote: Medication: ezetimibe  (ZETIA ) 10 MG tablet  Has the patient contacted their pharmacy? Yes No refills  This is the patient's preferred pharmacy:  [facility] [address] [place], [place] - [address] [place] [address] Phone: [phone] Fax: [phone]   Is this the correct pharmacy for this prescription? No If no, delete pharmacy and type the correct one.   Has the prescription been filled recently? No  Is the patient out of the medication? No, enough to last until Saturday  Has the patient been seen for an appointment in the last year OR does the patient have an upcoming appointment? Yes  Can we respond through MyChart? No  Agent: Please be advised that Rx refills may take up to 3 business days. We ask that you follow-up with your pharmacy.

## 2023-06-10 ENCOUNTER — Telehealth: Payer: Self-pay

## 2023-06-10 ENCOUNTER — Other Ambulatory Visit: Payer: Self-pay

## 2023-06-10 MED ORDER — EZETIMIBE 10 MG PO TABS
10.0000 mg | ORAL_TABLET | Freq: Every day | ORAL | 1 refills | Status: DC
Start: 2023-06-10 — End: 2023-11-12

## 2023-06-10 NOTE — Telephone Encounter (Signed)
 Copied from CRM [id number]. Topic: Clinical - Medication Question >> Jun 10, 2023 11:32 AM Donna Gilmore wrote: Reason for CRM: Pt's son, Donna Gilmore [phone] would like to suggest new medication for his mother-Neurodaway, and Oxy Codone. Able to receive messages via MyChart, but prefers a phone call back.  Patient called back and informed Donna Gilmore does not do precriptions for Oxycodone but she will refer her to pain management. Patient was giving the okay to take Neuropaway.

## 2023-06-12 ENCOUNTER — Telehealth: Payer: Self-pay | Admitting: Physical Medicine and Rehabilitation

## 2023-06-12 NOTE — Telephone Encounter (Signed)
 Patient's son called. He would like to know if the MRI results are in? His cb# [phone]

## 2023-06-16 ENCOUNTER — Encounter: Payer: Self-pay | Admitting: Physical Medicine and Rehabilitation

## 2023-06-16 ENCOUNTER — Telehealth: Payer: Self-pay | Admitting: Nurse Practitioner

## 2023-06-16 ENCOUNTER — Other Ambulatory Visit: Payer: Self-pay | Admitting: Nurse Practitioner

## 2023-06-16 ENCOUNTER — Ambulatory Visit: Admitting: Physical Medicine and Rehabilitation

## 2023-06-16 ENCOUNTER — Telehealth: Payer: Self-pay

## 2023-06-16 ENCOUNTER — Ambulatory Visit: Payer: Medicare Other | Admitting: Nurse Practitioner

## 2023-06-16 VITALS — BP 120/68 | HR 77

## 2023-06-16 DIAGNOSIS — M47816 Spondylosis without myelopathy or radiculopathy, lumbar region: Secondary | ICD-10-CM | POA: Diagnosis not present

## 2023-06-16 DIAGNOSIS — M5416 Radiculopathy, lumbar region: Secondary | ICD-10-CM | POA: Diagnosis not present

## 2023-06-16 DIAGNOSIS — M5442 Lumbago with sciatica, left side: Secondary | ICD-10-CM | POA: Diagnosis not present

## 2023-06-16 DIAGNOSIS — R269 Unspecified abnormalities of gait and mobility: Secondary | ICD-10-CM | POA: Diagnosis not present

## 2023-06-16 DIAGNOSIS — G8929 Other chronic pain: Secondary | ICD-10-CM | POA: Diagnosis not present

## 2023-06-16 NOTE — Progress Notes (Unsigned)
 Pain Scale   Average Pain 10 Back on the left side that radiates down into her left leg and toes.  Numbness/tingling in the left leg. Back pain for about 4- 5 months.  Last month has gotten worse Ambulates with a walker Tylenol , but does not help

## 2023-06-16 NOTE — Telephone Encounter (Signed)
 Patients son came in to the office to discuss the patients pain management for her neck radiating down to her right leg. She has seen Dr. Rexanne Catalina who ordered an MRI of the neck and had written a prescription for Tramadol that the son states "I did not know he sent the tramadol and the pharmacy said it will be ready in 20 minutes" I also seen that he saw Elvan Hamel NP [facility] physiatry) who is seeing her for her low back pain. She has ordered a MRI of her lumbar spine. I will reach out to Megan to see if she will continue to see her or if Dr. Rexanne Catalina needs to take over all the orthocare. Her next appt with Dr. Rexanne Catalina is on Wednesday.

## 2023-06-16 NOTE — Telephone Encounter (Signed)
 Copied from CRM [id number]. Topic: Clinical - Medical Advice >> Jun 16, 2023  3:13 PM Baldomero Bone wrote: Reason for CRM: [name], son, is inquiring why the patient went to the appointment scheduled with Elvan Hamel. He states the appointment was a waste of time because the patient was not given any pain medication, and that is what is needed. She needs to see an ortho doctor. Callback number is [phone]  Called and spoke with patient's son [name] and he wants to know why no one here in office can give  his mother any oxycodone. It was explained to vincent that we do not do chronic pain. Patient became very upset and stated he was coming here to talk with PCP face to face because he doesn't understand why no one can give her oxycodone here. Patient also stated that Elvan Hamel who they saw today was no help and she didn't do anything but do another MRI.

## 2023-06-16 NOTE — Progress Notes (Unsigned)
 Donna Gilmore - 83 y.o. female MRN [id number]  Date of birth: [birth year]  Office Visit Note: Visit Date: 06/16/2023 PCP: Susanna Epley, FNP Referred by: Susanna Epley, FNP  Subjective: Chief Complaint  Patient presents with   Lower Back - Pain   HPI: Donna Gilmore is a 83 y.o. female who comes in today per the request of Susanna Epley, NP for evaluation of chronic, worsening and severe left sided lower back pain radiating to left buttock and down lateral leg to foot. Patient is somewhat of a poor historian. Her son [name] is accompanying her during our visit today.  Pain ongoing for several months, worsens with laying down to sleep. She describes pain as sore and aching sensation, currently rates as 7 out of 10. Some relief of pain of home exercise regimen, rest and use of medications. No dedicated physical therapy for lower back pain. She does attend chiropractic treatments for her neck. Recent lumbar radiographs show scoliosis and multi level degenerative changes. She is a patient of Dr. Adelaide Adjutant with [facility], he is currently treating her for cervical issues and per his last note has a plan to address her chronic lower back pain. He did prescribe short course of Tramadol for her in April, however patient states she never received this medication. Her son is adamant that he needs to find out what is going on and is requesting pain medication for his mother. Patient is currently using rolling walker to assist with ambulation. Patient denies focal weakness. No recent trauma or falls.   Patients course is complicated by history of cerebellar stroke, diabetes mellitus, COPD, anxiety and depression. She is currently taking Plavix .      Review of Systems  Musculoskeletal:  Positive for back pain and myalgias.  Neurological:  Negative for tingling, sensory change, focal weakness and weakness.  All other systems reviewed and are negative.  Otherwise per HPI.  Assessment &  Plan: Visit Diagnoses:    ICD-10-CM   1. Chronic left-sided low back pain with left-sided sciatica  M54.42 MR LUMBAR SPINE WO CONTRAST   G89.29     2. Lumbar radiculopathy  M54.16 MR LUMBAR SPINE WO CONTRAST    3. Facet arthropathy, lumbar  M47.816 MR LUMBAR SPINE WO CONTRAST    4. Gait abnormality  R26.9 MR LUMBAR SPINE WO CONTRAST       Plan: Findings:  Chronic, worsening and severe left sided lower back pain radiating to left buttock and down lateral leg to foot. Patient continues to have severe pain despite good conservative therapies such as chiropractic treatments, home exercise regimen, rest and use of medications. Patients clinical presentation and exam are consistent with lumbar radiculopathy, more of L5 nerve pattern. I discussed treatment plan in detail today and placed order for lumbar MRI imaging. I do feel it would be best for patient to follow up with Dr. Rexanne Catalina as he is already managing her cervical issues and addressing chronic pain. He should be able to see lumbar MRI results after completion. Patient has no questions at this time. No red flag symptoms noted upon exam today.     Meds & Orders: No orders of the defined types were placed in this encounter.   Orders Placed This Encounter  Procedures   MR LUMBAR SPINE WO CONTRAST    Follow-up: Return for Follow up wtih Dr. Rexanne Catalina.   Procedures: No procedures performed      Clinical History: No specialty comments available.   She reports that she  quit smoking about 6 years ago. Her smoking use included cigarettes. She started smoking about 36 years ago. She has a 9 pack-year smoking history. She has never used smokeless tobacco.  Recent Labs    10/14/22 1118 02/13/23 1200 05/05/23 1651  HGBA1C 7.0* 7.4* 8.1*    Objective:  VS:  HT:    WT:   BMI:     BP:120/68  HR:77bpm  TEMP: ( )  RESP:  Physical Exam Vitals and nursing note reviewed.  HENT:     Head: Normocephalic and atraumatic.     Right Ear: External  ear normal.     Left Ear: External ear normal.     Nose: Nose normal.     Mouth/Throat:     Mouth: Mucous membranes are moist.  Eyes:     Extraocular Movements: Extraocular movements intact.  Cardiovascular:     Rate and Rhythm: Normal rate.     Pulses: Normal pulses.  Pulmonary:     Effort: Pulmonary effort is normal.  Abdominal:     General: Abdomen is flat. There is no distension.  Musculoskeletal:        General: Tenderness present.     Cervical back: Normal range of motion.     Comments: Difficulty with exam today due to lack of effort and pain. Patient is slow to rise from seated position to standing. Good lumbar range of motion. No pain noted with facet loading. 5/5 strength noted with bilateral hip flexion, knee flexion/extension, ankle dorsiflexion/plantarflexion and EHL. No clonus noted bilaterally. No pain upon palpation of greater trochanters. No pain with internal/external rotation of bilateral hips. Sensation intact bilaterally. Negative slump test bilaterally. Ambulates with rolling walker, gait slow and unsteady.   Skin:    General: Skin is warm and dry.     Capillary Refill: Capillary refill takes less than 2 seconds.  Neurological:     Mental Status: She is alert.     Motor: Weakness present.     Gait: Gait abnormal.     Ortho Exam  Imaging: No results found.  Past Medical/Family/Surgical/Social History: Medications & Allergies reviewed per EMR, new medications updated. Patient Active Problem List   Diagnosis Date Noted   Acute left-sided low back pain with left-sided sciatica 05/05/2023   Sinus pain 04/11/2023   Other fatigue 02/24/2023   COVID-19 vaccination declined 02/24/2023   Requires daily assistance for activities of daily living (ADL) and comfort needs 02/24/2023   Other constipation 02/13/2023   Vertigo 11/17/2022   Class 1 obesity due to excess calories with body mass index (BMI) of 33.0 to 33.9 in adult 11/17/2022   Diabetes mellitus (HCC)  11/17/2022   Decreased energy 10/24/2022   Encounter for annual health examination 10/14/2022   Need for influenza vaccination 10/14/2022   Leukocytes in urine 09/25/2022   Confusion and disorientation 09/25/2022   Heartburn 09/25/2022   Generalized headaches 09/25/2022   Stage 3a chronic kidney disease (HCC) 08/24/2022   Peripheral vascular disease, unspecified (HCC) 08/24/2022   Controlled type 2 diabetes mellitus with chronic kidney disease (HCC) 06/12/2022   Hot flashes 06/12/2022   Anxiety 03/14/2022   Depression 03/14/2022   Generalized weakness 03/14/2022   Nocturia more than twice per night 11/20/2021   Night-waking disorder, delayed sleep phase type 11/20/2021   REM sleep behavior disorder 11/20/2021   Moderate obstructive sleep apnea-hypopnea syndrome 11/20/2021   Chronic rhinitis 09/04/2020   COPD with asthma (HCC) 02/25/2019   Peripheral vascular disease (HCC) 02/25/2019   Pain  in left foot 08/21/2018   Chronic bilateral low back pain without sciatica 06/01/2018   Class 2 obesity due to excess calories with body mass index (BMI) of 35.0 to 35.9 in adult 06/01/2018   Shortness of breath 05/04/2018   Tinnitus, bilateral 03/18/2018   Meralgia paraesthetica, left 01/12/2018   Parotid mass 01/10/2017   Sensorineural hearing loss (SNHL) of both ears 01/10/2017   Mild cognitive impairment 10/14/2016   History of syphilis 10/14/2016   Mixed hyperlipidemia 10/14/2016   Insomnia 10/14/2016   Cerebellar stroke syndrome 10/23/2013   Smoker 02/16/2013   Tobacco abuse disorder 09/21/2012   Benign paroxysmal positional vertigo 08/26/2012   History of CVA (cerebrovascular accident) 08/25/2012   Type 2 DM with CKD stage 3 and hypertension (HCC) 08/25/2012   Dyslipidemia 08/25/2012   Past Medical History:  Diagnosis Date   Acute left-sided low back pain with left-sided sciatica 05/04/2018   Benign paroxysmal positional vertigo 08/26/2012   Chest pain 09/21/2012   CVA  (cerebral infarction) 08/25/2012   Diabetes mellitus without complication (HCC)    Dyslipidemia 08/25/2012   Essential hypertension, benign 08/25/2012   Hyperlipidemia    Hypertension    Meralgia paraesthetica, left 01/12/2018   Stroke (HCC)    Family History  Problem Relation Age of Onset   Kidney failure Mother    Diabetes Mother    Alzheimer's disease Mother    Alzheimer's disease Father    Prostate cancer Father    Diabetes Maternal Grandmother    Diabetes Maternal Grandfather    Alzheimer's disease Paternal Grandmother    Alzheimer's disease Paternal Grandfather    Prostate cancer Paternal Uncle    History reviewed. No pertinent surgical history. Social History   Occupational History   Occupation: retired  Tobacco Use   Smoking status: Former    Current packs/day: 0.00    Average packs/day: 0.3 packs/day for 30.0 years (9.0 ttl pk-yrs)    Types: Cigarettes    Start date: 52    Quit date: 2019    Years since quitting: 6.4   Smokeless tobacco: Never  Vaping Use   Vaping status: Never Used  Substance and Sexual Activity   Alcohol use: Not Currently    Comment: occasional   Drug use: No   Sexual activity: Not Currently

## 2023-06-18 ENCOUNTER — Ambulatory Visit: Payer: Self-pay

## 2023-06-18 DIAGNOSIS — M542 Cervicalgia: Secondary | ICD-10-CM | POA: Diagnosis not present

## 2023-06-18 DIAGNOSIS — R200 Anesthesia of skin: Secondary | ICD-10-CM | POA: Diagnosis not present

## 2023-06-18 DIAGNOSIS — M4802 Spinal stenosis, cervical region: Secondary | ICD-10-CM | POA: Diagnosis not present

## 2023-06-18 NOTE — Patient Instructions (Signed)
 Visit Information  Thank you for taking time to visit with me today. Please don't hesitate to contact me if I can be of assistance to you before our next scheduled appointment.  Our next appointment is by telephone on Monday, June 16 at 11:00 AM Please call the care guide team at [phone] if you need to cancel or reschedule your appointment.   Following is a copy of your care plan:   Goals Addressed             This Visit's Progress    VBCI RN Care Plan related to Chronic left-sided low back pain with left-sided sciatica; lumber radiculopathy       Problems:  Chronic Disease Management support and education needs related to Chronic left-sided low back pain with left-sided sciatica; lumbar radiculopathy   Goal: Over the next 90 days the Patient will continue to work with Medical illustrator and/or Social Worker to address care management and care coordination needs related to Chronic left-sided low back pain with left-sided sciatica; lumbar radiculopathy as evidenced by adherence to care management team scheduled appointments      Interventions:   Pain Interventions: Pain assessment performed Medications reviewed Reviewed provider established plan for pain management Discussed importance of adherence to all scheduled medical appointments Counseled on the importance of reporting any/all new or changed pain symptoms or management strategies to pain management provider Advised patient to report to care team affect of pain on daily activities Reviewed with patient prescribed pharmacological and nonpharmacological pain relief strategies  Patient Self-Care Activities:  Attend all scheduled provider appointments Call pharmacy for medication refills 3-7 days in advance of running out of medications Call provider office for new concerns or questions  Take medications as prescribed   Work with the social worker to address care coordination needs and will continue to work with the clinical  team to address health care and disease management related needs  Plan:  Follow up with Orthopedics and Pain management re: evaluation/treatment of Chronic left-sided low back pain with left-sided sciatica; lumbar radiculopathy Telephone follow up appointment with SW Ami Kail BSW scheduled for Tuesday, May 27 at 2:15 PM Telephone follow up appointment with care management team member scheduled for:  Monday June 16 at 11:00 AM             Please call 1-800-273-TALK (toll free, 24 hour hotline) if you are experiencing a Mental Health or Behavioral Health Crisis or need someone to talk to.  Patient verbalizes understanding of instructions and care plan provided today and agrees to view in MyChart. Active MyChart status and patient understanding of how to access instructions and care plan via MyChart confirmed with patient.     Louanne Roussel RN BSN CCM [place]  [facility], [facility] Health Nurse Care Coordinator  Direct Dial: [phone] Website: [email or web address]

## 2023-06-18 NOTE — Patient Outreach (Signed)
 Complex Care Management   Visit Note  06/18/2023  Name:  Donna Gilmore MRN: [id number] DOB: [birth year]  Situation: Referral received for Complex Care Management related to Diabetes with Complications, Chronic Kidney Disease, and Chronic left-sided low back pain with left-sided sciatica; lumbar radiculopathy. I obtained verbal consent from Patient.  Visit completed with patient on the phone.  Background:   Past Medical History:  Diagnosis Date   Acute left-sided low back pain with left-sided sciatica 05/04/2018   Benign paroxysmal positional vertigo 08/26/2012   Chest pain 09/21/2012   CVA (cerebral infarction) 08/25/2012   Diabetes mellitus without complication (HCC)    Dyslipidemia 08/25/2012   Essential hypertension, benign 08/25/2012   Hyperlipidemia    Hypertension    Meralgia paraesthetica, left 01/12/2018   Stroke [facility])     Assessment: Patient Reported Symptoms:  Cognitive Cognitive Status: Alert and oriented to person, place, and time Cognitive/Intellectual Conditions Management [RPT]: None reported or documented in medical history or problem list   Health Maintenance Behaviors: Annual physical exam, Immunizations  Neurological Neurological Review of Symptoms: Hearing changes Neurological Management Strategies: Medical device, Routine screening Neurological Self-Management Outcome: 3 (uncertain)  HEENT HEENT Symptoms Reported: Change or loss of hearing HEENT Conditions: Ear problem(s) HEENT Management Strategies: Medical device, Routine screening HEENT Self-Management Outcome: 3 (uncertain) Ear problem(s)  Cardiovascular Cardiovascular Symptoms Reported: Not assessed    Respiratory Respiratory Symptoms Reported: Not assesed    Endocrine Patient reports the following symptoms related to hypoglycemia or hyperglycemia : Not assessed Is patient diabetic?: Yes Endocrine Conditions: Diabetes Endocrine Management Strategies: Routine screening, Medication therapy, Diet  modification  Gastrointestinal Gastrointestinal Symptoms Reported: Not assessed      Genitourinary Genitourinary Symptoms Reported: Not assessed    Integumentary Integumentary Symptoms Reported: Not assessed    Musculoskeletal Musculoskelatal Symptoms Reviewed: Difficulty walking, Muscle pain, Unsteady gait, Weakness Additional Musculoskeletal Details: Chronic left-sided low back pain with left-sided sciatica; lumbar radiculopathy Musculoskeletal Conditions: Unsteady gait, Mobility limited, Joint pain, Back pain Musculoskeletal Management Strategies: Medication therapy, Routine screening Musculoskeletal Self-Management Outcome: 2 (bad)      Psychosocial Psychosocial Symptoms Reported: Not assessed     Quality of Family Relationships: involved, helpful, supportive Do you feel physically threatened by others?: No      02/13/2023   12:01 PM  Depression screen PHQ 2/9  Decreased Interest 3  Down, Depressed, Hopeless 3  PHQ - 2 Score 6  Altered sleeping 3  Tired, decreased energy 3  Change in appetite 2  Feeling bad or failure about yourself  1  Trouble concentrating 2  Moving slowly or fidgety/restless 0  Suicidal thoughts 0  PHQ-9 Score 17  Difficult doing work/chores Very difficult    There were no vitals filed for this visit.  Medications Reviewed Today   Medications were not reviewed in this encounter     Recommendation:   Specialty provider follow-up with Orthopedics as directed Completed your lumbar spinal MRI scheduled for 06/29/23 at 5:40 PM  Follow Up Plan:   Telephone follow up appointment date/time:  Monday, June 16 at 11:00 AM  Donna Roussel RN BSN CCM [place]  [facility], [facility] Health Nurse Care Coordinator  Direct Dial: [phone] Website: [email or web address]

## 2023-06-24 ENCOUNTER — Other Ambulatory Visit: Payer: Self-pay | Admitting: Licensed Clinical Social Worker

## 2023-06-24 NOTE — Patient Instructions (Signed)
 Visit Information  Thank you for taking time to visit with me today. Please don't hesitate to contact me if I can be of assistance to you before our next scheduled appointment.  Our next appointment is by telephone on 07/01/2023 at 1:30 pm Please call the care guide team at [phone] if you need to cancel or reschedule your appointment.   Following is a copy of your care plan:   Goals Addressed             This Visit's Progress    BSW VBCI Social Work Care Plan       Problems:   Patient is in need of applying for Medicaid and then needing more hours in the home for [facility] or HHA care  CSW Clinical Goal(s):   Over the next 2 weeks the Patient And son [name]  will will follow up with The walk in [facility]  as directed by Social Work.  Interventions:  SW will try to call DSS to see who the patients case manger is   Patient Goals/Self-Care Activities:  Follow up on insurance options [facility] walk [facility] clinic.  Plan:   Telephone follow up appointment with care management team member scheduled for:  07/01/2023 at 1:30 pm        Please call the Suicide and Crisis Lifeline: 988 go to [facility] [address] [place] [phone]) call 911 if you are experiencing a Mental Health or Behavioral Health Crisis or need someone to talk to.  Patient verbalizes understanding of instructions and care plan provided today and agrees to view in MyChart. Active MyChart status and patient understanding of how to access instructions and care plan via MyChart confirmed with patient.     Jonda Neighbours, PhD [facility], [facility] Social Worker Direct Dial: [phone]  Fax: [phone]

## 2023-06-24 NOTE — Patient Outreach (Addendum)
 Complex Care Management   Visit Note  06/24/2023  Name:  Donna Gilmore MRN: [id number] DOB: November 15, 1940  Situation: Referral received for Complex Care Management related to Medicaid for more PCS Services I obtained verbal consent from Patient and her son Donna Gilmore.  Visit completed with patient  on the phone  Background:   Past Medical History:  Diagnosis Date   Acute left-sided low back pain with left-sided sciatica 05/04/2018   Benign paroxysmal positional vertigo 08/26/2012   Chest pain 09/21/2012   CVA (cerebral infarction) 08/25/2012   Diabetes mellitus without complication (HCC)    Dyslipidemia 08/25/2012   Essential hypertension, benign 08/25/2012   Hyperlipidemia    Hypertension    Meralgia paraesthetica, left 01/12/2018   Stroke [facility])     Assessment: Patient was assisted by her son today due to her loss of hearing and has to use the Telecommuter machine. Family wants the patient to obtain more hours of PCS services and only has [facility] and needs to apply for Medicaid   .sdoh Recommendation:   none  Follow Up Plan:   Telephone follow up appointment date/time:  07/01/2023 at 1:30 pm  Jonda Neighbours, PhD [facility], [facility] Social Worker Direct Dial: [phone]  Fax: [phone]

## 2023-06-29 ENCOUNTER — Ambulatory Visit
Admission: RE | Admit: 2023-06-29 | Discharge: 2023-06-29 | Disposition: A | Discharge status: Home / Self Care | Source: Ambulatory Visit | Attending: Physical Medicine and Rehabilitation | Admitting: Physical Medicine and Rehabilitation

## 2023-06-29 DIAGNOSIS — M5416 Radiculopathy, lumbar region: Secondary | ICD-10-CM

## 2023-06-29 DIAGNOSIS — M5126 Other intervertebral disc displacement, lumbar region: Secondary | ICD-10-CM | POA: Diagnosis not present

## 2023-06-29 DIAGNOSIS — M47816 Spondylosis without myelopathy or radiculopathy, lumbar region: Secondary | ICD-10-CM | POA: Diagnosis not present

## 2023-06-29 DIAGNOSIS — R269 Unspecified abnormalities of gait and mobility: Secondary | ICD-10-CM

## 2023-06-29 DIAGNOSIS — G8929 Other chronic pain: Secondary | ICD-10-CM

## 2023-06-30 ENCOUNTER — Other Ambulatory Visit: Payer: Self-pay | Admitting: Nurse Practitioner

## 2023-06-30 DIAGNOSIS — G3184 Mild cognitive impairment, so stated: Secondary | ICD-10-CM

## 2023-07-01 ENCOUNTER — Other Ambulatory Visit: Payer: Self-pay | Admitting: Licensed Clinical Social Worker

## 2023-07-01 ENCOUNTER — Telehealth: Payer: Self-pay | Admitting: Pharmacist

## 2023-07-01 DIAGNOSIS — Z8709 Personal history of other diseases of the respiratory system: Secondary | ICD-10-CM

## 2023-07-01 DIAGNOSIS — J4489 Other specified chronic obstructive pulmonary disease: Secondary | ICD-10-CM

## 2023-07-01 MED ORDER — BREZTRI AEROSPHERE 160-9-4.8 MCG/ACT IN AERO: 2.0000 | INHALATION_SPRAY | Freq: Every day | RESPIRATORY_TRACT | 3 refills | Status: AC

## 2023-07-01 NOTE — Progress Notes (Signed)
   07/01/2023  Patient ID: Donna Gilmore, female   DOB: February 20, 1940, 83 y.o.   MRN: [id number]  Received a notification from Palmer Bobo, CPhT that a new prescription needed to be sent to [facility] on the Patient's behalf. Prescription was sent to [facility] with co-signature required.   Geronimo Krabbe, PharmD, BCACP Clinical Pharmacist [phone]

## 2023-07-01 NOTE — Patient Outreach (Signed)
 Complex Care Management   Visit Note  07/01/2023  Name:  Donna Gilmore MRN: [id number] DOB: [birth year]  Situation: Referral received for Complex Care Management related to increased hours or HHA and to move PT rehab to the home I obtained verbal consent from Patient.  Visit completed with patient  on the phone  Background:   Past Medical History:  Diagnosis Date   Acute left-sided low back pain with left-sided sciatica 05/04/2018   Benign paroxysmal positional vertigo 08/26/2012   Chest pain 09/21/2012   CVA (cerebral infarction) 08/25/2012   Diabetes mellitus without complication (HCC)    Dyslipidemia 08/25/2012   Essential hypertension, benign 08/25/2012   Hyperlipidemia    Hypertension    Meralgia paraesthetica, left 01/12/2018   Stroke [facility])     Assessment: Patient wants increased hours and for Rehab to be in the home    SDOH Interventions    Flowsheet Row Patient Outreach from 07/01/2023 in [place] POPULATION HEALTH DEPARTMENT Patient Outreach Telephone from 06/24/2023 in [place] POPULATION HEALTH DEPARTMENT Care Coordination from 06/18/2023 in [place] POPULATION HEALTH DEPARTMENT Care Coordination from 02/24/2023 in [facility] Care Coordination Office Visit from 02/13/2023 in [facility] Office Visit from 10/14/2022 in [facility]  SDOH Interventions        Food Insecurity Interventions Intervention Not Indicated Intervention Not Indicated AMB Referral AMB Referral -- --  Housing Interventions Intervention Not Indicated Intervention Not Indicated Intervention Not Indicated Intervention Not Indicated -- --  Transportation Interventions Intervention Not Indicated Intervention Not Indicated Intervention Not Indicated -- -- --  Utilities Interventions Intervention Not Indicated Intervention Not Indicated AMB Referral AMB Referral -- --  Depression Interventions/Treatment  -- -- --  -- Medication Medication  Financial Strain Interventions Intervention Not Indicated Intervention Not Indicated -- -- -- --  Health Literacy Interventions -- -- Intervention Not Indicated -- -- --         Recommendation:   none  Follow Up Plan:   Telephone follow up appointment date/time:  07/16/2023 at 10:00 am  Jonda Neighbours, PhD [facility], [facility] Social Worker Direct Dial: [phone]  Fax: [phone]

## 2023-07-01 NOTE — Patient Instructions (Signed)
 Visit Information  Thank you for taking time to visit with me today. Please don't hesitate to contact me if I can be of assistance to you before our next scheduled appointment.  Your next care management appointment is by telephone on 07/16/2023 at 10:00 am   Please call the care guide team at [phone] if you need to cancel, schedule, or reschedule an appointment.   Please call the Suicide and Crisis Lifeline: 988 go to [facility] Urgent [facility] [address], [place] [phone]) call 911 if you are experiencing a Mental Health or Behavioral Health Crisis or need someone to talk to.  Jonda Neighbours, PhD [facility], [facility] Social Worker Direct Dial: [phone]  Fax: [phone]

## 2023-07-01 NOTE — Telephone Encounter (Signed)
-----   Message from [place] sent at 07/01/2023  9:45 AM EDT ----- Hello, Got another notice from [facility] about needing a  new active RX for her Breztri  inhaler. Notice indexed to media. Thanks. If a Rx has been sent please disregard.

## 2023-07-02 ENCOUNTER — Other Ambulatory Visit: Payer: Self-pay

## 2023-07-02 ENCOUNTER — Ambulatory Visit: Attending: Nurse Practitioner | Admitting: Physical Therapy

## 2023-07-02 ENCOUNTER — Encounter: Payer: Self-pay | Admitting: Physical Therapy

## 2023-07-02 DIAGNOSIS — M5459 Other low back pain: Secondary | ICD-10-CM | POA: Diagnosis not present

## 2023-07-02 NOTE — Therapy (Signed)
 OUTPATIENT PHYSICAL THERAPY THORACOLUMBAR EVALUATION   Patient Name: Donna Gilmore MRN: [id number] DOB:January 24, 1941, 83 y.o., female Today's Date: 07/02/2023  END OF SESSION:  PT End of Session - 07/02/23 1343     Visit Number 1    Number of Visits 1    PT Start Time 1230    PT Stop Time 1300    PT Time Calculation (min) 30 min             Past Medical History:  Diagnosis Date   Acute left-sided low back pain with left-sided sciatica 05/04/2018   Benign paroxysmal positional vertigo 08/26/2012   Chest pain 09/21/2012   CVA (cerebral infarction) 08/25/2012   Diabetes mellitus without complication (HCC)    Dyslipidemia 08/25/2012   Essential hypertension, benign 08/25/2012   Hyperlipidemia    Hypertension    Meralgia paraesthetica, left 01/12/2018   Stroke [facility])    History reviewed. No pertinent surgical history. Patient Active Problem List   Diagnosis Date Noted   Acute left-sided low back pain with left-sided sciatica 05/05/2023   Sinus pain 04/11/2023   Other fatigue 02/24/2023   COVID-19 vaccination declined 02/24/2023   Requires daily assistance for activities of daily living (ADL) and comfort needs 02/24/2023   Other constipation 02/13/2023   Vertigo 11/17/2022   Class 1 obesity due to excess calories with body mass index (BMI) of 33.0 to 33.9 in adult 11/17/2022   Diabetes mellitus (HCC) 11/17/2022   Decreased energy 10/24/2022   Encounter for annual health examination 10/14/2022   Need for influenza vaccination 10/14/2022   Leukocytes in urine 09/25/2022   Confusion and disorientation 09/25/2022   Heartburn 09/25/2022   Generalized headaches 09/25/2022   Stage 3a chronic kidney disease (HCC) 08/24/2022   Peripheral vascular disease, unspecified (HCC) 08/24/2022   Controlled type 2 diabetes mellitus with chronic kidney disease (HCC) 06/12/2022   Hot flashes 06/12/2022   Anxiety 03/14/2022   Depression 03/14/2022   Generalized weakness 03/14/2022    Nocturia more than twice per night 11/20/2021   Night-waking disorder, delayed sleep phase type 11/20/2021   REM sleep behavior disorder 11/20/2021   Moderate obstructive sleep apnea-hypopnea syndrome 11/20/2021   Chronic rhinitis 09/04/2020   COPD with asthma (HCC) 02/25/2019   Peripheral vascular disease (HCC) 02/25/2019   Pain in left foot 08/21/2018   Chronic bilateral low back pain without sciatica 06/01/2018   Class 2 obesity due to excess calories with body mass index (BMI) of 35.0 to 35.9 in adult 06/01/2018   Shortness of breath 05/04/2018   Tinnitus, bilateral 03/18/2018   Meralgia paraesthetica, left 01/12/2018   Parotid mass 01/10/2017   Sensorineural hearing loss (SNHL) of both ears 01/10/2017   Mild cognitive impairment 10/14/2016   History of syphilis 10/14/2016   Mixed hyperlipidemia 10/14/2016   Insomnia 10/14/2016   Cerebellar stroke syndrome 10/23/2013   Smoker 02/16/2013   Tobacco abuse disorder 09/21/2012   Benign paroxysmal positional vertigo 08/26/2012   History of CVA (cerebrovascular accident) 08/25/2012   Type 2 DM with CKD stage 3 and hypertension (HCC) 08/25/2012   Dyslipidemia 08/25/2012    PCP: Susanna Epley, FNP  REFERRING PROVIDER: Susanna Epley, FNP  REFERRING DIAG: M54.50,G89.29 (ICD-10-CM) - Chronic bilateral low back pain without sciatica   Rationale for Evaluation and Treatment: Rehabilitation  THERAPY DIAG:  Other low back pain  PERTINENT HISTORY: DM2, Cerebellar stroke, PVD, COPD, BPPV, CKD3  WEIGHT BEARING RESTRICTIONS: No  FALLS:  Has patient fallen in last 6 months? No  LIVING  ENVIRONMENT: Lives with: lives with their family Lives in: House/apartment Stairs: No Has following equipment at home: Single point cane, Environmental consultant - 4 wheeled, and Wheelchair (manual)  OCCUPATION: not currently working   PRECAUTIONS: None ---------------------------------------------------------------------------------------------  SUBJECTIVE:                                                                                                                                                                                            SUBJECTIVE STATEMENT: Eval statement 07/02/2023: has pain from the L side of the neck, down to the L foot. Pain has been present for around 2 months. Has been going to the chiropractor because she was told it would help her neck. (Pt is a poor historian)  RED FLAGS: Night pain, whole body L sided pain    PLOF: Needs assistance with ADLs  PATIENT GOALS: help the pain, figure out what's going on  NEXT MD VISIT: pt isnt sure. ---------------------------------------------------------------------------------------------  OBJECTIVE:  Note: Objective measures were completed at Evaluation unless otherwise noted.  DIAGNOSTIC FINDINGS:  IMPRESSION: 1. Positive for single level degenerative Cervical, but also multilevel visible upper thoracic, spinal stenosis with degenerative spinal cord mass effect. No cord signal changes identified at the C5-C6 level of moderate spinal stenosis, but there is evidence of mild nonspecific spinal cord Myelomalacia at the cervicothoracic junction, not related to stenosis there. Regarding lower extremity symptoms, follow-up Thoracic spine MRI might be valuable.   2. Severe bilateral C6 neural foraminal stenosis. Moderate to severe left greater than right C7, left C5 degenerative foraminal stenosis also.     Electronically Signed   By: Marlise Simpers M.D.   On: 06/13/2023 08:30  PATIENT SURVEYS:  Unable to complete d/t cognitive deficits  COGNITION: Overall cognitive status: Impaired   PALPATION: Tenderness to L piriformis (recreated symptoms)  Lumbar contraction pattern  Unable to enter testing position SENSATION: Reported sensation loss, unable to tolerate sitting for testing position  MUSCLE LENGTH: Unable to tolerate testing posting  POSTURE: rounded shoulders,  forward head, and flexed trunk    LUMBAR ROM:  Unsafe performance, unable to tolerate  AROM eval  Flexion   Extension   Right lateral flexion   Left lateral flexion   Right rotation   Left rotation    (Blank rows = not tested)  ! Indicates pain with testing  LOWER EXTREMITY ROM:   unable to tolerate reaching passive end range  Passive  Right eval Left eval  Hip flexion    Hip extension    Hip abduction    Hip adduction    Hip internal rotation    Hip external rotation  Knee flexion    Knee extension    Ankle dorsiflexion    Ankle plantarflexion    Ankle inversion    Ankle eversion     (Blank rows = not tested)  ! Indicates pain with testing  LOWER EXTREMITY MMT:    MMT Right eval Left eval  Hip flexion    Hip extension    Hip abduction    Hip adduction    Hip internal rotation    Hip external rotation    Knee flexion    Knee extension    Ankle dorsiflexion    Ankle plantarflexion    Ankle inversion    Ankle eversion     (Blank rows = not tested)   ! Indicates pain with testing LUMBAR SPECIAL TESTS:  Unable to tolerate testing positions  GAIT: Distance walked: 15ft Assistive device utilized: Walker - 4 wheeled Level of assistance: SBA Comments  [facility] Adult PT Treatment:                                                DATE: 07/02/2023  Self Care: Pt education                                                                                                                                 PATIENT EDUCATION:  Education details: Pt received education regarding HEP performance, ADL performance, functional activity tolerance, impairment education, appropriate performance of therapeutic activities.  Person educated: Patient and Child(ren) Education method: Explanation, Demonstration, Tactile cues, and Verbal cues Education comprehension: verbal cues required, tactile cues required, and needs further education  HOME EXERCISE PROGRAM: Access Code:  [email or web address] URL: [email or web address] Date: 07/02/2023 Prepared by: Albesa Huguenin  Exercises - Piriformis Mobilization with Small Ball  - 1-2 x daily - 7 x weekly - 1 sets - 1 reps - 75m hold - Supine Piriformis Stretch with Foot on Ground  - 1 x daily - 5 x weekly - 2 sets - 1 reps - 52m hold - Supine Figure 4 Piriformis Stretch  - 1 x daily - 7 x weekly - 2 sets - 1 reps - 14m hold - Supine Bridge  - 1 x daily - 7 x weekly - 3 sets - 12 reps - 3s hold ---------------------------------------------------------------------------------------------  ASSESSMENT:  CLINICAL IMPRESSION: Eval impression (07/02/2023): Pt. attended today's physical therapy session 15 minutes late for evaluation of LBP. Pt has complaints of Whole body L sided pain. Pt has notable deficits with functional transfers, ambulation, sitting, standing, activity tolerance, and cardiovascular endurance. Pt could not tolerate most testing positions and demonstrated impaired cognition and auditory processing. From what testing was completed, pt seems to have impairment with the L piriformis likely ilicitting symptoms on LLE. Pt is not currently appropriate for OPPT d/t the highly taxing effort with basic mobility  and inability to leave the house if not for her son driving. Pt would benefit from consideration of home health multidisciplinary therapy including PT and OT. Treatment performed today focused on pt education  Pt demonstrated poor understanding of education provided. required maximal verbal/tactile cues and moderate physical assistance for appropriate performance with today's bed mobility.    OBJECTIVE IMPAIRMENTS: Abnormal gait, cardiopulmonary status limiting activity, decreased activity tolerance, decreased balance, decreased cognition, decreased coordination, decreased endurance, decreased knowledge of condition, decreased knowledge of use of DME, decreased mobility, difficulty walking, decreased ROM, decreased  strength, hypomobility, impaired UE functional use, improper body mechanics, postural dysfunction, and pain.   ACTIVITY LIMITATIONS: carrying, lifting, bending, sitting, standing, squatting, sleeping, stairs, transfers, bed mobility, toileting, dressing, self feeding, reach over head, and locomotion level  PARTICIPATION LIMITATIONS: cleaning, laundry, driving, shopping, and community activity  PERSONAL FACTORS: Age, Behavior pattern, Education, Fitness, Past/current experiences, Time since onset of injury/illness/exacerbation, and 3+ comorbidities: DM2, Cerebellar stroke, PVD, COPD, BPPV, CKD3 are also affecting patient's functional outcome.   REHAB POTENTIAL: Poor not appropriate for OPPT  CLINICAL DECISION MAKING: Unstable/unpredictable  EVALUATION COMPLEXITY: High   PLAN:  PT FREQUENCY: one time visit  PT DURATION: one time visit  PLANNED INTERVENTIONS: one time visit.  PLAN FOR NEXT SESSION: one time visit   Albesa Huguenin, PT, DPT 07/02/2023, 1:45 PM

## 2023-07-03 ENCOUNTER — Ambulatory Visit (INDEPENDENT_AMBULATORY_CARE_PROVIDER_SITE_OTHER): Admitting: Family Medicine

## 2023-07-03 ENCOUNTER — Ambulatory Visit: Payer: Self-pay

## 2023-07-03 VITALS — BP 130/88 | Temp 98.4°F | Ht 61.0 in | Wt 184.4 lb

## 2023-07-03 DIAGNOSIS — B349 Viral infection, unspecified: Secondary | ICD-10-CM

## 2023-07-03 DIAGNOSIS — R051 Acute cough: Secondary | ICD-10-CM | POA: Diagnosis not present

## 2023-07-03 MED ORDER — BENZONATATE 100 MG PO CAPS: 100.0000 mg | ORAL_CAPSULE | Freq: Three times a day (TID) | ORAL | 0 refills | Status: DC | PRN

## 2023-07-03 NOTE — Progress Notes (Signed)
 Donna Gilmore, CMA,acting as a Neurosurgeon for Donna Gilmore.,have documented all relevant documentation on the behalf of Donna Gilmore,as directed by  Donna Gilmore while in the presence of Donna Gilmore.  Subjective:  Patient ID: Donna Gilmore , female    DOB: [birth year] , 83 y.o.   MRN: [id number]  Chief Complaint  Patient presents with   Cough    HPI  Patient is a 83 year old female presents today for a productive cough with clear mucus that started overnight , She denies any chills, fever, headaches, chest pain or body aches. She has seasonal allergies but states that she has not been using her allergy meds. Advised to start taking her antihistamines. She and her daughter agrees to the plan.     Past Medical History:  Diagnosis Date   Acute left-sided low back pain with left-sided sciatica 05/04/2018   Benign paroxysmal positional vertigo 08/26/2012   Chest pain 09/21/2012   CVA (cerebral infarction) 08/25/2012   Diabetes mellitus without complication (HCC)    Dyslipidemia 08/25/2012   Essential hypertension, benign 08/25/2012   Hyperlipidemia    Hypertension    Meralgia paraesthetica, left 01/12/2018   Stroke (HCC)      Family History  Problem Relation Age of Onset   Kidney failure Mother    Diabetes Mother    Alzheimer's disease Mother    Alzheimer's disease Father    Prostate cancer Father    Diabetes Maternal Grandmother    Diabetes Maternal Grandfather    Alzheimer's disease Paternal Grandmother    Alzheimer's disease Paternal Grandfather    Prostate cancer Paternal Uncle      Current Outpatient Medications:    acetaminophen  (TYLENOL ) 500 MG tablet, Take 1 tablet (500 mg total) by mouth every 6 (six) hours as needed., Disp: 30 tablet, Rfl: 0   albuterol  (VENTOLIN  HFA) 108 (90 Base) MCG/ACT inhaler, Inhale 2 puffs into the lungs every 6 (six) hours as needed for wheezing or shortness of breath. (Patient not taking: Reported on 07/14/2023), Disp: 8.5  g, Rfl: 2   benzonatate  (TESSALON  PERLES) 100 MG capsule, Take 1 capsule (100 mg total) by mouth 3 (three) times daily as needed., Disp: 30 capsule, Rfl: 0   budesonide -glycopyrrolate-formoterol  (BREZTRI  AEROSPHERE) 160-9-4.8 MCG/ACT AERO inhaler, Inhale 2 puffs into the lungs daily., Disp: 32.1 g, Rfl: 3   cholecalciferol (VITAMIN D3) 25 MCG (1000 UNIT) tablet, Take 1,000 Units by mouth daily., Disp: , Rfl:    clopidogrel  (PLAVIX ) 75 MG tablet, TAKE 1 TABLET BY MOUTH DAILY, Disp: 100 tablet, Rfl: 2   COVID-19 At-Home Test KIT, 1 each by In Vitro route as needed., Disp: 1 kit, Rfl: 2   dapagliflozin  propanediol (FARXIGA ) 10 MG TABS tablet, Take 1 tablet (10 mg total) by mouth daily before breakfast., Disp: , Rfl:    donepezil  (ARICEPT ) 10 MG tablet, TAKE 1 TABLET BY MOUTH AT  BEDTIME, Disp: 100 tablet, Rfl: 2   ezetimibe  (ZETIA ) 10 MG tablet, Take 1 tablet (10 mg total) by mouth daily., Disp: 90 tablet, Rfl: 1   gabapentin  (NEURONTIN ) 100 MG capsule, TAKE 1 CAPSULE BY MOUTH 3 TIMES  DAILY AS NEEDED, Disp: 300 capsule, Rfl: 2   glucose blood (ONETOUCH ULTRA) test strip, USE WITH METER TO CHECK BLOOD SUGAR BEFORE BREAKFAST AND BEFORE DINNER, Disp: 200 strip, Rfl: 2   glucose blood (ONETOUCH ULTRA) test strip, USE AS DIRECTED WITH  METER  TO  CHECK  BLOOD  SUGAR  BEFORE  BREAKFAST  AND  DINNER, Disp: 100 each, Rfl: 3   loratadine  (CLARITIN ) 10 MG tablet, Take 1 tablet (10 mg total) by mouth daily., Disp: 30 tablet, Rfl: 5   mometasone  (NASONEX ) 50 MCG/ACT nasal spray, Place 2 sprays into the nose daily., Disp: 1 each, Rfl: 2   olmesartan  (BENICAR ) 20 MG tablet, Take 1 tablet (20 mg total) by mouth daily., Disp: 90 tablet, Rfl: 1   Olopatadine  HCl 0.6 % SOLN, Place 2 sprays into the nose 2 (two) times daily as needed., Disp: 30.5 g, Rfl: 5   pantoprazole  (PROTONIX ) 40 MG tablet, TAKE 1 TABLET BY MOUTH DAILY, Disp: 100 tablet, Rfl: 2   vitamin C  (ASCORBIC ACID ) 500 MG tablet, Take 500 mg by mouth daily.,  Disp: , Rfl:    VITAMIN D  PO, Take 1 tablet by mouth daily., Disp: , Rfl:    atorvastatin  (LIPITOR) 80 MG tablet, TAKE 1 TABLET BY MOUTH ONCE  DAILY (Patient not taking: Reported on 07/03/2023), Disp: 100 tablet, Rfl: 2   desonide (DESOWEN) 0.05 % ointment, Apply 1 Application topically 2 (two) times daily. for itchy rash (Patient not taking: Reported on 07/03/2023), Disp: 60 g, Rfl: 5   diclofenac Sodium (VOLTAREN) 1 % GEL, Apply topically 4 (four) times daily. (Patient not taking: Reported on 07/03/2023), Disp: , Rfl:    levocetirizine (XYZAL ) 5 MG tablet, Take 1 tablet (5 mg total) by mouth every evening. (Patient not taking: Reported on 07/03/2023), Disp: 30 tablet, Rfl: 11   meclizine  (ANTIVERT ) 25 MG tablet, Take 1 tablet (25 mg total) by mouth 3 (three) times daily as needed for dizziness., Disp: 30 tablet, Rfl: 0  Current Facility-Administered Medications:    0.9 %  sodium chloride  infusion, 500 mL, Intravenous, Once, Tobin Forts, MD   No Known Allergies   Review of Systems  Constitutional: Negative.  Negative for chills and fever.  HENT:  Negative for ear pain, rhinorrhea, sneezing and sore throat.   Respiratory: Negative.  Negative for cough and wheezing.   Cardiovascular: Negative.   Psychiatric/Behavioral: Negative.       Today's Vitals   07/03/23 1651  BP: 130/88  Temp: 98.4 F (36.9 C)  SpO2: 98%  Weight: 184 lb 6.4 oz (83.6 kg)  Height: 5' 1 (1.549 m)   Body mass index is 34.84 kg/m.  Wt Readings from Last 3 Encounters:  07/03/23 184 lb 6.4 oz (83.6 kg)  06/04/23 188 lb 3.2 oz (85.4 kg)  05/05/23 194 lb 3.2 oz (88.1 kg)    The ASCVD Risk score (Arnett DK, et al., 2019) failed to calculate for the following reasons:   The 2019 ASCVD risk score is only valid for ages 69 to 2   Risk score cannot be calculated because patient has a medical history suggesting prior/existing ASCVD  Objective:  Physical Exam HENT:     Head: Normocephalic.   Cardiovascular:     Rate  and Rhythm: Normal rate.  Pulmonary:     Effort: Pulmonary effort is normal.     Breath sounds: Normal breath sounds.   Skin:    General: Skin is warm and dry.   Neurological:     Mental Status: She is alert. Mental status is at baseline.   Psychiatric:        Mood and Affect: Mood normal.         Assessment And Plan:  Acute cough -     Benzonatate ; Take 1 capsule (100 mg total) by mouth 3 (three)  times daily as needed.  Dispense: 30 capsule; Refill: 0  Viral illness Assessment & Plan: Gave samples of Norel AD.     Return if symptoms worsen or fail to improve, for keep up next appointment.  Patient was given opportunity to ask questions. Patient verbalized understanding of the plan and was able to repeat key elements of the plan. All questions were answered to their satisfaction.  I, Donna Gilmore, have reviewed all documentation for this visit. The documentation on 07/14/2023 for the exam, diagnosis, procedures, and orders are all accurate and complete.    IF YOU HAVE BEEN REFERRED TO A SPECIALIST, IT MAY TAKE 1-2 WEEKS TO SCHEDULE/PROCESS THE REFERRAL. IF YOU HAVE NOT HEARD FROM US /SPECIALIST IN TWO WEEKS, PLEASE GIVE US  A CALL AT [phone].

## 2023-07-03 NOTE — Telephone Encounter (Signed)
 FYI Only or Action Required?: FYI only for provider  Patient was last seen in primary care on 05/05/2023 by Susanna Epley, FNP. Called Nurse Triage reporting Cough. Symptoms began several days ago. Interventions attempted: Nothing. Symptoms are: gradually worsening.  Triage Disposition: See HCP Within 4 Hours (Or PCP Triage)-Scheduled   Patient/caregiver understands and will follow disposition?: Yes  Patient educated on pertinent s/s that would warrant emergent help/call 911/ ED/UC.          Copied from CRM [id number]. Topic: Clinical - Red Word Triage >> Jul 03, 2023 12:46 PM Zipporah Him wrote: Red Word that prompted transfer to Nurse Triage: Patient is having issues with mucus, and coughing it out. Mucus is in her throat and she's use salt water rinse to gargle with which has not helped. Pain currently in her throat, feeling like its getting worse. Reason for Disposition  [1] MILD difficulty breathing (e.g., minimal/no SOB at rest, SOB with walking, pulse <100) AND [2] still present when not coughing  Answer Assessment - Initial Assessment Questions 1. ONSET: "When did the cough begin?"      -----------3-4days . Having trouble coughing up.   2. SEVERITY: "How bad is the cough today?"      ------------- Bothersome    3. SPUTUM: "Describe the color of your sputum" (none, dry cough; clear, white, yellow, green)     ---- Clear    4. HEMOPTYSIS: "Are you coughing up any blood?" If so ask: "How much?" (flecks, streaks, tablespoons, etc.)     -------------Denies    5. DIFFICULTY BREATHING: "Are you having difficulty breathing?" If Yes, ask: "How bad is it?" (e.g., mild, moderate, severe)    - MILD: No SOB at rest, mild SOB with walking, speaks normally in sentences, can lie down, no retractions, pulse < 100.    - MODERATE: SOB at rest, SOB with minimal exertion and prefers to sit, cannot lie down flat, speaks in phrases, mild retractions, audible wheezing, pulse 100-120.    - SEVERE:  Very SOB at rest, speaks in single words, struggling to breathe, sitting hunched forward, retractions, pulse > 120       ------------Mild: Does have a chronic hx of difficulty breathing. Patient able to articulate needs ( in full sentences) during the call.   6. FEVER: "Do you have a fever?" If Yes, ask: "What is your temperature, how was it measured, and when did it start?"     ---------- Denies    7. CARDIAC HISTORY: "Do you have any history of heart disease?" (e.g., heart attack, congestive heart failure)      -----------  8. LUNG HISTORY: "Do you have any history of lung disease?"  (e.g., pulmonary embolus, asthma, emphysema)     ------------Denies   9. PE RISK FACTORS: "Do you have a history of blood clots?" (or: recent major surgery, recent prolonged travel, bedridden) -----------------Denies   10. OTHER SYMPTOMS: "Do you have any other symptoms?" (e.g., runny nose, wheezing, chest pain)       --- Sore throat  Protocols used: Cough - Acute Productive-A-AH

## 2023-07-08 DIAGNOSIS — M5416 Radiculopathy, lumbar region: Secondary | ICD-10-CM | POA: Diagnosis not present

## 2023-07-08 DIAGNOSIS — M25512 Pain in left shoulder: Secondary | ICD-10-CM | POA: Diagnosis not present

## 2023-07-09 ENCOUNTER — Telehealth: Payer: Self-pay

## 2023-07-09 ENCOUNTER — Telehealth: Payer: Self-pay | Admitting: Nurse Practitioner

## 2023-07-09 NOTE — Telephone Encounter (Signed)
 Contacted patient son to inform him patient is not to discontinue blood thinner medication. Unable to reach son, LVM

## 2023-07-09 NOTE — Telephone Encounter (Signed)
 Please see patient request for further information. Please call patient.    Copied from CRM [id number]. Topic: Clinical - Medical Advice >> Jul 09, 2023  9:52 AM Alpha Arts wrote: Reason for CRM: Patient's son, [name] would like to know if patient can stop taking blood thinners.  Callback #: [id number]

## 2023-07-11 ENCOUNTER — Other Ambulatory Visit: Payer: Self-pay | Admitting: Neurosurgery

## 2023-07-11 DIAGNOSIS — M25512 Pain in left shoulder: Secondary | ICD-10-CM

## 2023-07-12 ENCOUNTER — Ambulatory Visit
Admission: RE | Admit: 2023-07-12 | Discharge: 2023-07-12 | Disposition: A | Discharge status: Home / Self Care | Source: Ambulatory Visit | Attending: Neurosurgery | Admitting: Neurosurgery

## 2023-07-12 DIAGNOSIS — M25512 Pain in left shoulder: Secondary | ICD-10-CM

## 2023-07-12 DIAGNOSIS — M75102 Unspecified rotator cuff tear or rupture of left shoulder, not specified as traumatic: Secondary | ICD-10-CM | POA: Diagnosis not present

## 2023-07-13 ENCOUNTER — Emergency Department (HOSPITAL_COMMUNITY)
Admission: EM | Admit: 2023-07-13 | Discharge: 2023-07-14 | Disposition: A | Discharge status: Home / Self Care | Attending: Emergency Medicine | Admitting: Emergency Medicine

## 2023-07-13 ENCOUNTER — Other Ambulatory Visit: Payer: Self-pay

## 2023-07-13 ENCOUNTER — Encounter (HOSPITAL_COMMUNITY): Payer: Self-pay

## 2023-07-13 ENCOUNTER — Emergency Department (HOSPITAL_COMMUNITY)

## 2023-07-13 DIAGNOSIS — R6889 Other general symptoms and signs: Secondary | ICD-10-CM | POA: Diagnosis not present

## 2023-07-13 DIAGNOSIS — N189 Chronic kidney disease, unspecified: Secondary | ICD-10-CM | POA: Diagnosis not present

## 2023-07-13 DIAGNOSIS — H81391 Other peripheral vertigo, right ear: Secondary | ICD-10-CM | POA: Insufficient documentation

## 2023-07-13 DIAGNOSIS — E1122 Type 2 diabetes mellitus with diabetic chronic kidney disease: Secondary | ICD-10-CM | POA: Diagnosis not present

## 2023-07-13 DIAGNOSIS — J449 Chronic obstructive pulmonary disease, unspecified: Secondary | ICD-10-CM | POA: Diagnosis not present

## 2023-07-13 DIAGNOSIS — G319 Degenerative disease of nervous system, unspecified: Secondary | ICD-10-CM | POA: Diagnosis not present

## 2023-07-13 DIAGNOSIS — I6782 Cerebral ischemia: Secondary | ICD-10-CM | POA: Diagnosis not present

## 2023-07-13 DIAGNOSIS — R42 Dizziness and giddiness: Secondary | ICD-10-CM

## 2023-07-13 DIAGNOSIS — I639 Cerebral infarction, unspecified: Secondary | ICD-10-CM | POA: Diagnosis not present

## 2023-07-13 DIAGNOSIS — R519 Headache, unspecified: Secondary | ICD-10-CM | POA: Diagnosis not present

## 2023-07-13 DIAGNOSIS — R404 Transient alteration of awareness: Secondary | ICD-10-CM | POA: Diagnosis not present

## 2023-07-13 LAB — CBC
HCT: 38.9 % (ref 36.0–46.0)
Hemoglobin: 12.6 g/dL (ref 12.0–15.0)
MCH: 28.3 pg (ref 26.0–34.0)
MCHC: 32.4 g/dL (ref 30.0–36.0)
MCV: 87.4 fL (ref 80.0–100.0)
Platelets: 250 10*3/uL (ref 150–400)
RBC: 4.45 MIL/uL (ref 3.87–5.11)
RDW: 16.2 % — ABNORMAL HIGH (ref 11.5–15.5)
WBC: 9.2 10*3/uL (ref 4.0–10.5)
nRBC: 0 % (ref 0.0–0.2)

## 2023-07-13 LAB — CBG MONITORING, ED: Glucose-Capillary: 103 mg/dL — ABNORMAL HIGH (ref 70–99)

## 2023-07-13 LAB — COMPREHENSIVE METABOLIC PANEL WITH GFR
ALT: 18 U/L (ref 0–44)
AST: 26 U/L (ref 15–41)
Albumin: 3.8 g/dL (ref 3.5–5.0)
Alkaline Phosphatase: 70 U/L (ref 38–126)
Anion gap: 13 (ref 5–15)
BUN: 16 mg/dL (ref 8–23)
CO2: 21 mmol/L — ABNORMAL LOW (ref 22–32)
Calcium: 10 mg/dL (ref 8.9–10.3)
Chloride: 104 mmol/L (ref 98–111)
Creatinine, Ser: 1.62 mg/dL — ABNORMAL HIGH (ref 0.44–1.00)
GFR, Estimated: 31 mL/min — ABNORMAL LOW (ref >60)
Glucose, Bld: 94 mg/dL (ref 70–99)
Potassium: 3.8 mmol/L (ref 3.5–5.1)
Sodium: 138 mmol/L (ref 135–145)
Total Bilirubin: 0.8 mg/dL (ref 0.0–1.2)
Total Protein: 7.2 g/dL (ref 6.5–8.1)

## 2023-07-13 MED ORDER — MIDAZOLAM HCL 2 MG/2ML IJ SOLN
2.0000 mg | Freq: Once | INTRAMUSCULAR | Status: AC
  Administered 2023-07-13: 2 mg via INTRAVENOUS
  Filled 2023-07-13: qty 2

## 2023-07-13 MED ORDER — ACETAMINOPHEN 500 MG PO TABS
1000.0000 mg | ORAL_TABLET | Freq: Once | ORAL | Status: AC
  Administered 2023-07-13: 1000 mg via ORAL
  Filled 2023-07-13: qty 2

## 2023-07-13 MED ORDER — LACTATED RINGERS IV BOLUS
1000.0000 mL | Freq: Once | INTRAVENOUS | Status: AC
  Administered 2023-07-13: 1000 mL via INTRAVENOUS

## 2023-07-13 MED ORDER — MECLIZINE HCL 25 MG PO TABS: 25.0000 mg | ORAL_TABLET | Freq: Three times a day (TID) | ORAL | 0 refills | Status: DC | PRN

## 2023-07-13 MED ORDER — MECLIZINE HCL 25 MG PO TABS
25.0000 mg | ORAL_TABLET | Freq: Once | ORAL | Status: AC
  Administered 2023-07-13: 25 mg via ORAL
  Filled 2023-07-13: qty 1

## 2023-07-13 NOTE — ED Triage Notes (Addendum)
 Pt bib [facility] coming from home after waking up with dizziness and weakness. Pt has dizziness when lying and standing. Pt does have hx of vertigo. Pt was prescribed meclizine  but stopped taking bc prt states it wasn't helping. Pt states been having a headache that started last night. SOB x a few months. EMS reports orthostatic changes in blood pressure and heart rate. GCS 15.  EMS: 120/78 70 HR 20RR 97% RA 133cbg

## 2023-07-13 NOTE — ED Notes (Signed)
 Ambulated pt in hallway, pt had steady gait and family stated her walking was at baseline. Before getting back to bed, pt complained of slight dizziness.

## 2023-07-13 NOTE — ED Provider Notes (Cosign Needed Addendum)
 [facility] Provider Note   CSN: [id number] Arrival date & time: 07/13/23  1517     History Chief Complaint  Patient presents with   Dizziness    Donna Gilmore is a 83 y.o. female w/ PMHx BPPV, Hx CVA, DM II, COPD, PVD, CKD, who presents to the ED for evaluation of dizziness.  Patient reports dizziness after walking to the bathroom today.  She describes dizziness as room spinning as well as lightheadedness.  She reports she also had headache.  She reports head is now sore when she touches it.  She denies any headache at this time.  She also states that she felt an abnormal sensation in her right ear.  She denies any fevers chills.  No vision changes.  No chest pain shortness of breath.  No speech changes, numbness weakness tingling.  She states this does feel similar to previous vertigo.  Patient did not take any medication such as meclizine  prior to arrival.  Patient states she has had poor p.o. intake today.    Physical Exam Updated Vital Signs BP 134/64   Pulse 64   Temp (!) 97.5 F (36.4 C) (Oral)   Resp 17   Ht 5' 1 (1.549 m)   SpO2 100%   BMI 34.84 kg/m  Physical Exam Vitals and nursing note reviewed.  Constitutional:      General: She is not in acute distress.    Appearance: She is well-developed.     Comments: elderly  HENT:     Head: Normocephalic and atraumatic.     Ears:     Comments: Significant cerumen in right EAC    Mouth/Throat:     Mouth: Mucous membranes are dry.   Eyes:     Extraocular Movements: Extraocular movements intact.     Conjunctiva/sclera: Conjunctivae normal.     Pupils: Pupils are equal, round, and reactive to light.    Cardiovascular:     Rate and Rhythm: Normal rate and regular rhythm.     Pulses: Normal pulses.     Heart sounds: Normal heart sounds. No murmur heard. Pulmonary:     Effort: Pulmonary effort is normal. No respiratory distress.     Breath sounds: Normal breath  sounds.   Musculoskeletal:        General: Normal range of motion.     Cervical back: Neck supple.   Skin:    General: Skin is warm and dry.     Capillary Refill: Capillary refill takes less than 2 seconds.   Neurological:     General: No focal deficit present.     Mental Status: She is alert.     Cranial Nerves: No cranial nerve deficit.     Sensory: No sensory deficit.     Motor: No weakness.     Coordination: Coordination normal.     ED Results / Procedures / Treatments   Labs (all labs ordered are listed, but only abnormal results are displayed) Labs Reviewed  COMPREHENSIVE METABOLIC PANEL WITH GFR - Abnormal; Notable for the following components:      Result Value   CO2 21 (*)    Creatinine, Ser 1.62 (*)    GFR, Estimated 31 (*)    All other components within normal limits  CBC - Abnormal; Notable for the following components:   RDW 16.2 (*)    All other components within normal limits  CBG MONITORING, ED - Abnormal; Notable for the following components:  Glucose-Capillary 103 (*)    All other components within normal limits  CBG MONITORING, ED    EKG EKG Interpretation Date/Time:  Sunday July 13 2023 16:39:16 EDT Ventricular Rate:  77 PR Interval:  152 QRS Duration:  91 QT Interval:  389 QTC Calculation: 441 R Axis:   26  Text Interpretation: Sinus rhythm Borderline T abnormalities, diffuse leads No significant change since last tracing Confirmed by Albertus Hughs [phone]) on 07/13/2023 4:54:13 PM  Radiology MR BRAIN WO CONTRAST Result Date: 07/13/2023 CLINICAL DATA:  Initial evaluation for acute neuro deficit, stroke suspected. EXAM: MRI HEAD WITHOUT CONTRAST TECHNIQUE: Multiplanar, multiecho pulse sequences of the brain and surrounding structures were obtained without intravenous contrast. COMPARISON:  CT from 11/02/2022. FINDINGS: Brain: Examination degraded by motion artifact. Generalized age-related cerebral atrophy. Patchy T2/FLAIR hyperintensity involving  the periventricular deep white matter both cerebral hemispheres, most characteristic of chronic microvascular ischemic disease, minor for age. Small remote left cerebellar infarct noted. No abnormal foci of restricted diffusion to suggest acute or subacute ischemia. Gray-white matter differentiation maintained. Nor is or chronic cortical infarction. No acute or chronic intracranial blood products. Susceptibility artifact related to scattered dural calcifications noted. No mass lesion, midline shift or mass effect. No hydrocephalus or extra-axial fluid collection. Pituitary gland within normal limits. Vascular: Major intracranial vascular flow voids are grossly maintained on this motion degraded exam. Diminutive vertebrobasilar system noted. Skull and upper cervical spine: Craniocervical junction within normal limits. Bone marrow signal intensity normal. No scalp soft tissue abnormality. Sinuses/Orbits: Prior ocular lens replacement on the left. Globes and orbital soft tissues demonstrate no acute finding. Paranasal sinuses are largely clear. No significant mastoid effusion. Other: None. IMPRESSION: 1. No acute intracranial abnormality. 2. Small remote left cerebellar infarct with underlying mild chronic microvascular ischemic disease for age. Electronically Signed   By: Virgia Griffins M.D.   On: 07/13/2023 22:51    Medications Ordered in ED Medications  meclizine  (ANTIVERT ) tablet 25 mg (has no administration in time range)  lactated ringers  bolus 1,000 mL (0 mLs Intravenous Stopped 07/13/23 1815)  meclizine  (ANTIVERT ) tablet 25 mg (25 mg Oral Given 07/13/23 1702)  midazolam (VERSED) injection 2 mg (2 mg Intravenous Given 07/13/23 2139)    ED Course/ Medical Decision Making/ A&P  Donna Gilmore is a 82 y.o. female presents as detailed above  Differential ddx: Peripheral vertigo, CVA, headache, orthostatic hypotension, dehydration, electrolyte abnormalities, UTI, hypoglycemia  On arrival,  patient afebrile hemodynamically stable no hypoxia or respiratory distress.  No nystagmus.  Patient becomes significantly symptomatic when sitting up to listen to lung sounds.  No focal neurologic deficits.  Unable to complete Dix-Hallpike due to patient's mobility  ED Work-up: Please see details of labs and imaging listed above. Point-of-care glucose 103 on arrival. Administered liter fluid bolus and meclizine  on arrival. EKG without STEMI or arrhythmia.  No significant change when compared to prior. Orthostatic vital signs negative. No significant abnormality on CBC or CMP ( cr at baseline)  Patient had resolution of symptoms after receiving meclizine  and liter fluid bolus. Family states they are tired of waiting.  As patient has had resolution of symptoms they would like to be discharged home and not proceed with MRI.  As patient developed dizziness during ambulation, I advised patient needs to ambulate (using walker for her baseline) before being discharged.  I advised if patient is asymptomatic during ambulation we do not need to proceed with MRI. MRI tech came to transport patient to MRI shortly after.  Family  subsequently agrees to proceed with MRI.  Patient received 2 mg of Versed to tolerate MRI.  MRI brain did not reveal acute CVA or other acute abnormality. Remote infarct noted. Patient ambulatory with walker with minimal dizziness.  Patient will receive additional dose of meclizine  prior to discharge.  Prescription of meclizine  sent to patient's pharmacy.  Patient and daughter feel safe with discharge plan.    Overall impression benign positional peripheral vertigo Patient stable for discharge and outpatient follow-up. Strict return precautions provided. Patient voices understanding and agrees with plan.   Patient seen with supervising physician who agrees with plan.  Final Clinical Impression(s) / ED Diagnoses Final diagnoses:  Dizziness  Peripheral vertigo involving right  ear    Rx / DC Orders ED Discharge Orders          Ordered    meclizine  (ANTIVERT ) 25 MG tablet  3 times daily PRN        07/13/23 2315            Angele Keller, DO PGY-3 Emergency Medicine    Angele Keller, DO 07/13/23 2321    Angele Keller, DO 07/13/23 2359    Albertus Hughs, DO 07/14/23 2328

## 2023-07-13 NOTE — Discharge Instructions (Addendum)
 You may take meclizine  every 6-8 hours as needed for dizziness.  Please ensure you are using walker to ambulate and limit ambulation until vertigo has resolved. Thank you for allowing us  to take care of you today.  We hope you begin feeling better soon.   To-Do:  Please follow-up with your primary doctor within the next 2-3 days. Please return to the Emergency Department or call 911 if you experience chest pain, shortness of breath, severe pain, severe fever, altered mental status, or have any reason to think that you need emergency medical care.  Thank you again.  Hope you feel better soon.  [facility] Department of Emergency Medicine

## 2023-07-14 ENCOUNTER — Ambulatory Visit: Payer: Self-pay

## 2023-07-14 ENCOUNTER — Other Ambulatory Visit: Payer: Self-pay

## 2023-07-14 ENCOUNTER — Encounter: Payer: Self-pay | Admitting: Family Medicine

## 2023-07-14 DIAGNOSIS — R051 Acute cough: Secondary | ICD-10-CM | POA: Insufficient documentation

## 2023-07-14 DIAGNOSIS — B349 Viral infection, unspecified: Secondary | ICD-10-CM | POA: Insufficient documentation

## 2023-07-14 NOTE — Patient Outreach (Signed)
 Complex Care Management   Visit Note  07/14/2023  Name:  Donna Gilmore MRN: [id number] DOB: 28-May-1940  Situation: Referral received for Complex Care Management related to Stroke, Diabetes with Complications, and Sensorineural hearing loss of both ears, chronic back and shoulder pain, mild cognitive impairment, fatigue, Benign paroxysmal positional vertigo, COPD with Asthma, OSA. I obtained verbal consent from Patient.  Visit completed with patient and son [name] on the phone.  Background:   Past Medical History:  Diagnosis Date   Acute left-sided low back pain with left-sided sciatica 05/04/2018   Benign paroxysmal positional vertigo 08/26/2012   Chest pain 09/21/2012   CVA (cerebral infarction) 08/25/2012   Diabetes mellitus without complication (HCC)    Dyslipidemia 08/25/2012   Essential hypertension, benign 08/25/2012   Hyperlipidemia    Hypertension    Meralgia paraesthetica, left 01/12/2018   Stroke [facility])     Assessment: Patient Reported Symptoms:  Cognitive Cognitive Status: Alert and oriented to person, place, and time Cognitive/Intellectual Conditions Management [RPT]: Other Other: Mild Cognitive Impairment   Health Maintenance Behaviors: Annual physical exam Health Facilitated by: Pain control  Neurological Neurological Review of Symptoms: Hearing changes, Other:, Headaches, Dizziness Oher Neurological Symptoms/Conditions [RPT]: bilateral neurosensory hearing loss; Benign paroxysmal positional vertigo Neurological Conditions: Stroke, ischemic (hx of CVA in 2014) Neurological Management Strategies: Routine screening, Medication therapy Neurological Self-Management Outcome: 3 (uncertain)  HEENT HEENT Symptoms Reported: Change or loss of hearing HEENT Conditions: Ear problem(s) HEENT Management Strategies: Medical device, Routine screening HEENT Self-Management Outcome: 3 (uncertain) Ear problem(s)  Cardiovascular Cardiovascular Symptoms Reported: Dizziness,  Fatigue Does patient have uncontrolled Hypertension?: No Cardiovascular Conditions: High blood cholesterol (peripheal vascular disease) Cardiovascular Management Strategies: Medication therapy, Adequate rest, Routine screening Cardiovascular Self-Management Outcome: 3 (uncertain)  Respiratory Respiratory Symptoms Reported: Shortness of breath Respiratory Conditions: COPD, Asthma, Seasonal allergies, Shortness of breath, Sleep disordered breathing Respiratory Self-Management Outcome: 3 (uncertain)  Endocrine Patient reports the following symptoms related to hypoglycemia or hyperglycemia : Not assessed    Gastrointestinal Gastrointestinal Symptoms Reported: Obesity Gastrointestinal Management Strategies: Diet modification Gastrointestinal Self-Management Outcome: 3 (uncertain) Nutrition Risk Screen (CP): No indicators present  Genitourinary Genitourinary Symptoms Reported: No symptoms reported Genitourinary Conditions: Chronic kidney disease Genitourinary Management Strategies: Fluid modification Genitourinary Self-Management Outcome: 3 (uncertain)  Integumentary  Integumentary Symptoms Reported: not assessed     Musculoskeletal Musculoskelatal Symptoms Reviewed: Muscle pain, Weakness, Difficulty walking Musculoskeletal Conditions: Joint pain, Mobility limited, Unsteady gait, Osteoarthritis, Back pain (left shoulder pain) Musculoskeletal Management Strategies: Routine screening, Medication therapy, Medical device Musculoskeletal Self-Management Outcome: 3 (uncertain) Falls in the past year?: No Number of falls in past year: 1 or less Was there an injury with Fall?: No Fall Risk Category Calculator: 0 Patient Fall Risk Level: Low Fall Risk Patient at Risk for Falls Due to: Impaired balance/gait, Impaired mobility, Orthopedic patient Fall risk Follow up: Education provided, Falls evaluation completed  Psychosocial Psychosocial Symptoms Reported: Not assessed            07/03/2023     4:51 PM  Depression screen PHQ 2/9  Decreased Interest 0  Down, Depressed, Hopeless 0  PHQ - 2 Score 0  Altered sleeping 0  Tired, decreased energy 0  Change in appetite 0  Feeling bad or failure about yourself  0  Trouble concentrating 0  Moving slowly or fidgety/restless 0  Suicidal thoughts 0  PHQ-9 Score 0  Difficult doing work/chores Not difficult at all    There were no vitals filed for this visit.  Medications Reviewed  Today     Reviewed by Kaylene Pascal, RN (Registered Nurse) on 07/14/23 at 1126  Med List Status: <None>   Medication Order Taking? Sig Documenting Provider Last Dose Status Informant  0.9 %  sodium chloride  infusion [id number]   Tobin Forts, MD  Active   acetaminophen  (TYLENOL ) 500 MG tablet [id number]  Take 1 tablet (500 mg total) by mouth every 6 (six) hours as needed. Corine Dice, MD  Active   albuterol  (VENTOLIN  HFA) 108 (90 Base) MCG/ACT inhaler [id number]  Inhale 2 puffs into the lungs every 6 (six) hours as needed for wheezing or shortness of breath.  Patient not taking: Reported on 07/14/2023   Susanna Epley, FNP  Active   atorvastatin  (LIPITOR) 80 MG tablet [id number]  TAKE 1 TABLET BY MOUTH ONCE  DAILY  Patient not taking: Reported on 07/03/2023   Susanna Epley, FNP  Active   benzonatate  (TESSALON  PERLES) 100 MG capsule [id number]  Take 1 capsule (100 mg total) by mouth 3 (three) times daily as needed. [name], NP  Active   budesonide -glycopyrrolate-formoterol  (BREZTRI  AEROSPHERE) 160-9-4.8 MCG/ACT AERO inhaler [id number] Yes Inhale 2 puffs into the lungs daily. Susanna Epley, FNP  Active   cholecalciferol (VITAMIN D3) 25 MCG (1000 UNIT) tablet [id number]  Take 1,000 Units by mouth daily. [provider]  Active   clopidogrel  (PLAVIX ) 75 MG tablet [id number]  TAKE 1 TABLET BY MOUTH DAILY Susanna Epley, FNP  Active   COVID-19 At-Home Test KIT [id number]  1 each by In Vitro route as needed. Susanna Epley, FNP  Active   dapagliflozin   propanediol (FARXIGA ) 10 MG TABS tablet [id number]  Take 1 tablet (10 mg total) by mouth daily before breakfast. Susanna Epley, FNP  Active   desonide (DESOWEN) 0.05 % ointment [id number]  Apply 1 Application topically 2 (two) times daily. for itchy rash  Patient not taking: Reported on 07/03/2023   Brian Campanile, MD  Active   diclofenac Sodium (VOLTAREN) 1 % GEL [id number]  Apply topically 4 (four) times daily.  Patient not taking: Reported on 07/03/2023   [provider]  Active   donepezil  (ARICEPT ) 10 MG tablet [id number]  TAKE 1 TABLET BY MOUTH AT  BEDTIME Susanna Epley, FNP  Active   ezetimibe  (ZETIA ) 10 MG tablet [id number]  Take 1 tablet (10 mg total) by mouth daily. Susanna Epley, FNP  Active   gabapentin  (NEURONTIN ) 100 MG capsule [id number]  TAKE 1 CAPSULE BY MOUTH 3 TIMES  DAILY AS NEEDED Susanna Epley, FNP  Active   glucose blood (ONETOUCH ULTRA) test strip [id number]  USE WITH METER TO CHECK BLOOD SUGAR BEFORE BREAKFAST AND BEFORE [name], Abelino Able, FNP  Active   glucose blood (ONETOUCH ULTRA) test strip [id number]  USE AS DIRECTED WITH  METER  TO  CHECK  BLOOD  SUGAR  BEFORE  BREAKFAST  AND  DINNER Moore, Janece, FNP  Active   levocetirizine (XYZAL ) 5 MG tablet [id number]  Take 1 tablet (5 mg total) by mouth every evening.  Patient not taking: Reported on 07/03/2023   Brian Campanile, MD  Active   loratadine  (CLARITIN ) 10 MG tablet [id number]  Take 1 tablet (10 mg total) by mouth daily. Brian Campanile, MD  Active   meclizine  (ANTIVERT ) 25 MG tablet [id number]  Take 1 tablet (25 mg total) by mouth 3 (three) times daily as needed for dizziness. Angele Keller, DO  Active   mometasone  (NASONEX ) 50 MCG/ACT nasal spray [id number]  Place 2 sprays into the  nose daily. Susanna Epley, FNP  Active   olmesartan  (BENICAR ) 20 MG tablet [id number]  Take 1 tablet (20 mg total) by mouth daily. Susanna Epley, FNP  Active            Med Note Gaylyn Keas, Kendra Pavy Feb 13, 2023 10:43 AM) Patient reports she taking olmesartan -hydrochlorothiazide    Olopatadine  HCl 0.6 % SOLN [id number]  Place 2 sprays into the nose 2 (two) times daily as needed. Brian Campanile, MD  Active   pantoprazole  (PROTONIX ) 40 MG tablet [id number]  TAKE 1 TABLET BY MOUTH DAILY [name], NP  Active   vitamin C  (ASCORBIC ACID ) 500 MG tablet [id number]  Take 500 mg by mouth daily. [provider]  Active Self  VITAMIN D  PO [id number]  Take 1 tablet by mouth daily. [provider]  Active             Recommendation:   PCP Follow-up with Dr. Elnita Hai on 07/15/23 at 10:20 AM Specialty provider follow-up with Orthopedics as directed for shoulder and back pain   Follow Up Plan:   Telephone follow up appointment date/time:  Monday, June 30, at 09:30 AM  Louanne Roussel RN BSN CCM [place]  [facility], [facility] Nurse Care Coordinator  Direct Dial: [phone] Website: [email or web address]

## 2023-07-14 NOTE — Telephone Encounter (Signed)
 FYI Only or Action Required?: Action required by provider  Patient was last seen in primary care on 07/03/2023 by [name], NP. Called Nurse Triage reporting Shortness of Breath. Symptoms began several months ago. Interventions attempted: Prescription medications: inhalers. Symptoms are: SOB mild to moderate brought on by exerting herself, cough in the morninggradually worsening.  Triage Disposition: See Physician Within 24 Hours (overriding See HCP Within 4 Hours (Or PCP Triage))  Patient/caregiver understands and will follow disposition?: Yes                       Copied from CRM [id number]. Topic: Clinical - Red Word Triage >> Jul 14, 2023 10:50 AM Rosamond Comes wrote: Red Word that prompted transfer to Nurse Triage:  Hard to breath patient son [name] calling, had to call back due to phone issues, [name] asking if patient can stop Blood thinner medication and needs to schedule an appt ER follow up appt.  Please call [name] [phone]  Hard to breath    ----------------------------------------------------------------------- From previous Reason for Contact - Medication Question: Reason for CRM: patient son [name] calling, had to call back due to phone issues, [name] asking if patient can stop Blood thinner medication and needs to schedule an appt ER follow up appt.  Please call [name] [phone]  Hard to breath Reason for Disposition  [1] Longstanding difficulty breathing (e.g., CHF, COPD, emphysema) AND [2] WORSE than normal  Answer Assessment - Initial Assessment Questions 1. RESPIRATORY STATUS: Describe your breathing? (e.g., wheezing, shortness of breath, unable to speak, severe coughing)      Shortness of breath.  2. ONSET: When did this breathing problem begin?      X 3-4 months.  3. PATTERN Does the difficult breathing come and go, or has it been constant since it started?      Constant.  4. SEVERITY: How bad is your breathing? (e.g.,  mild, moderate, severe)    - MILD: No SOB at rest, mild SOB with walking, speaks normally in sentences, can lie down, no retractions, pulse < 100.    - MODERATE: SOB at rest, SOB with minimal exertion and prefers to sit, cannot lie down flat, speaks in phrases, mild retractions, audible wheezing, pulse 100-120.    - SEVERE: Very SOB at rest, speaks in single words, struggling to breathe, sitting hunched forward, retractions, pulse > 120      Mild to moderate, SOB while exerting herself.  5. RECURRENT SYMPTOM: Have you had difficulty breathing before? If Yes, ask: When was the last time? and What happened that time?      Yes for several months and has gradually worsened. Son states she has been treating with her inhalers.  6. CARDIAC HISTORY: Do you have any history of heart disease? (e.g., heart attack, angina, bypass surgery, angioplasty)      No.  7. LUNG HISTORY: Do you have any history of lung disease?  (e.g., pulmonary embolus, asthma, emphysema)     COPD.  8. CAUSE: What do you think is causing the breathing problem?      Unsure.  9. OTHER SYMPTOMS: Do you have any other symptoms? (e.g., dizziness, runny nose, cough, chest pain, fever)     Dizziness (seen in ED yesterday and has resolved today), productive cough.  10. O2 SATURATION MONITOR:  Do you use an oxygen saturation monitor (pulse oximeter) at home? If Yes, ask: What is your reading (oxygen level) today? What is your usual oxygen saturation reading? (e.g., 95%)  No.  11. PREGNANCY: Is there any chance you are pregnant? When was your last menstrual period?       N/A.  12. TRAVEL: Have you traveled out of the country in the last month? (e.g., travel history, exposures)       No.  Patient's son, [name], would like to know about her blood thinner as she has held it for 4 days due to receiving injections from neurosurgeon and was told to hold it for 10 days.  Protocols used: Breathing  Difficulty-A-AH

## 2023-07-14 NOTE — Assessment & Plan Note (Signed)
 Gave samples of Norel AD

## 2023-07-14 NOTE — Progress Notes (Unsigned)
 Del Favia, CMA,acting as a Neurosurgeon for Susanna Epley, FNP.,have documented all relevant documentation on the behalf of Susanna Epley, FNP,as directed by  Susanna Epley, FNP while in the presence of Susanna Epley, FNP.  Subjective:  Patient ID: Donna Gilmore , female    DOB: 1940/11/29 , 83 y.o.   MRN: [id number]  No chief complaint on file.   HPI  HPI   Past Medical History:  Diagnosis Date   Acute left-sided low back pain with left-sided sciatica 05/04/2018   Benign paroxysmal positional vertigo 08/26/2012   Chest pain 09/21/2012   CVA (cerebral infarction) 08/25/2012   Diabetes mellitus without complication (HCC)    Dyslipidemia 08/25/2012   Essential hypertension, benign 08/25/2012   Hyperlipidemia    Hypertension    Meralgia paraesthetica, left 01/12/2018   Stroke (HCC)      Family History  Problem Relation Age of Onset   Kidney failure Mother    Diabetes Mother    Alzheimer's disease Mother    Alzheimer's disease Father    Prostate cancer Father    Diabetes Maternal Grandmother    Diabetes Maternal Grandfather    Alzheimer's disease Paternal Grandmother    Alzheimer's disease Paternal Grandfather    Prostate cancer Paternal Uncle      Current Outpatient Medications:    acetaminophen  (TYLENOL ) 500 MG tablet, Take 1 tablet (500 mg total) by mouth every 6 (six) hours as needed., Disp: 30 tablet, Rfl: 0   albuterol  (VENTOLIN  HFA) 108 (90 Base) MCG/ACT inhaler, Inhale 2 puffs into the lungs every 6 (six) hours as needed for wheezing or shortness of breath. (Patient not taking: Reported on 07/14/2023), Disp: 8.5 g, Rfl: 2   atorvastatin  (LIPITOR) 80 MG tablet, TAKE 1 TABLET BY MOUTH ONCE  DAILY (Patient not taking: Reported on 07/03/2023), Disp: 100 tablet, Rfl: 2   benzonatate  (TESSALON  PERLES) 100 MG capsule, Take 1 capsule (100 mg total) by mouth 3 (three) times daily as needed., Disp: 30 capsule, Rfl: 0   budesonide -glycopyrrolate-formoterol  (BREZTRI  AEROSPHERE)  160-9-4.8 MCG/ACT AERO inhaler, Inhale 2 puffs into the lungs daily., Disp: 32.1 g, Rfl: 3   cholecalciferol (VITAMIN D3) 25 MCG (1000 UNIT) tablet, Take 1,000 Units by mouth daily., Disp: , Rfl:    clopidogrel  (PLAVIX ) 75 MG tablet, TAKE 1 TABLET BY MOUTH DAILY, Disp: 100 tablet, Rfl: 2   COVID-19 At-Home Test KIT, 1 each by In Vitro route as needed., Disp: 1 kit, Rfl: 2   dapagliflozin  propanediol (FARXIGA ) 10 MG TABS tablet, Take 1 tablet (10 mg total) by mouth daily before breakfast., Disp: , Rfl:    desonide (DESOWEN) 0.05 % ointment, Apply 1 Application topically 2 (two) times daily. for itchy rash (Patient not taking: Reported on 07/03/2023), Disp: 60 g, Rfl: 5   diclofenac Sodium (VOLTAREN) 1 % GEL, Apply topically 4 (four) times daily. (Patient not taking: Reported on 07/03/2023), Disp: , Rfl:    donepezil  (ARICEPT ) 10 MG tablet, TAKE 1 TABLET BY MOUTH AT  BEDTIME, Disp: 100 tablet, Rfl: 2   ezetimibe  (ZETIA ) 10 MG tablet, Take 1 tablet (10 mg total) by mouth daily., Disp: 90 tablet, Rfl: 1   gabapentin  (NEURONTIN ) 100 MG capsule, TAKE 1 CAPSULE BY MOUTH 3 TIMES  DAILY AS NEEDED, Disp: 300 capsule, Rfl: 2   glucose blood (ONETOUCH ULTRA) test strip, USE WITH METER TO CHECK BLOOD SUGAR BEFORE BREAKFAST AND BEFORE DINNER, Disp: 200 strip, Rfl: 2   glucose blood (ONETOUCH ULTRA) test strip, USE AS DIRECTED WITH  METER  TO  CHECK  BLOOD  SUGAR  BEFORE  BREAKFAST  AND  DINNER, Disp: 100 each, Rfl: 3   levocetirizine (XYZAL ) 5 MG tablet, Take 1 tablet (5 mg total) by mouth every evening. (Patient not taking: Reported on 07/03/2023), Disp: 30 tablet, Rfl: 11   loratadine  (CLARITIN ) 10 MG tablet, Take 1 tablet (10 mg total) by mouth daily., Disp: 30 tablet, Rfl: 5   meclizine  (ANTIVERT ) 25 MG tablet, Take 1 tablet (25 mg total) by mouth 3 (three) times daily as needed for dizziness., Disp: 30 tablet, Rfl: 0   mometasone  (NASONEX ) 50 MCG/ACT nasal spray, Place 2 sprays into the nose daily., Disp: 1 each,  Rfl: 2   olmesartan  (BENICAR ) 20 MG tablet, Take 1 tablet (20 mg total) by mouth daily., Disp: 90 tablet, Rfl: 1   Olopatadine  HCl 0.6 % SOLN, Place 2 sprays into the nose 2 (two) times daily as needed., Disp: 30.5 g, Rfl: 5   pantoprazole  (PROTONIX ) 40 MG tablet, TAKE 1 TABLET BY MOUTH DAILY, Disp: 100 tablet, Rfl: 2   vitamin C  (ASCORBIC ACID ) 500 MG tablet, Take 500 mg by mouth daily., Disp: , Rfl:    VITAMIN D  PO, Take 1 tablet by mouth daily., Disp: , Rfl:   Current Facility-Administered Medications:    0.9 %  sodium chloride  infusion, 500 mL, Intravenous, Once, Tobin Forts, MD   No Known Allergies   Review of Systems   There were no vitals filed for this visit. There is no height or weight on file to calculate BMI.  Wt Readings from Last 3 Encounters:  07/03/23 184 lb 6.4 oz (83.6 kg)  06/04/23 188 lb 3.2 oz (85.4 kg)  05/05/23 194 lb 3.2 oz (88.1 kg)    The ASCVD Risk score (Arnett DK, et al., 2019) failed to calculate for the following reasons:   The 2019 ASCVD risk score is only valid for ages 8 to 75   Risk score cannot be calculated because patient has a medical history suggesting prior/existing ASCVD  Objective:  Physical Exam      Assessment And Plan:  There are no diagnoses linked to this encounter.  No follow-ups on file.  Patient was given opportunity to ask questions. Patient verbalized understanding of the plan and was able to repeat key elements of the plan. All questions were answered to their satisfaction.    Inge Mangle, FNP, have reviewed all documentation for this visit. The documentation on 07/14/23 for the exam, diagnosis, procedures, and orders are all accurate and complete.   IF YOU HAVE BEEN REFERRED TO A SPECIALIST, IT MAY TAKE 1-2 WEEKS TO SCHEDULE/PROCESS THE REFERRAL. IF YOU HAVE NOT HEARD FROM US /SPECIALIST IN TWO WEEKS, PLEASE GIVE US  A CALL AT [phone].

## 2023-07-15 ENCOUNTER — Ambulatory Visit: Payer: Self-pay | Admitting: Internal Medicine

## 2023-07-15 ENCOUNTER — Telehealth: Payer: Self-pay

## 2023-07-15 ENCOUNTER — Encounter: Payer: Self-pay | Admitting: Nurse Practitioner

## 2023-07-15 ENCOUNTER — Ambulatory Visit (INDEPENDENT_AMBULATORY_CARE_PROVIDER_SITE_OTHER): Admitting: Nurse Practitioner

## 2023-07-15 VITALS — BP 130/72 | HR 81 | Temp 98.4°F | Ht 61.0 in | Wt 181.6 lb

## 2023-07-15 DIAGNOSIS — M545 Low back pain, unspecified: Secondary | ICD-10-CM

## 2023-07-15 DIAGNOSIS — H81391 Other peripheral vertigo, right ear: Secondary | ICD-10-CM | POA: Diagnosis not present

## 2023-07-15 DIAGNOSIS — R0989 Other specified symptoms and signs involving the circulatory and respiratory systems: Secondary | ICD-10-CM | POA: Diagnosis not present

## 2023-07-15 DIAGNOSIS — R0602 Shortness of breath: Secondary | ICD-10-CM | POA: Diagnosis not present

## 2023-07-15 DIAGNOSIS — G8929 Other chronic pain: Secondary | ICD-10-CM

## 2023-07-15 MED ORDER — MOMETASONE FUROATE 50 MCG/ACT NA SUSP
2.0000 | Freq: Every day | NASAL | 2 refills | Status: AC
Start: 2023-07-15 — End: 2024-07-14

## 2023-07-15 NOTE — Patient Instructions (Signed)
 Visit Information  Thank you for taking time to visit with me today. Please don't hesitate to contact me if I can be of assistance to you before our next scheduled appointment.  Your next care management appointment is by telephone on Monday, June 30 at 09:30 AM  Please call the care guide team at [phone] if you need to cancel, schedule, or reschedule an appointment.   Please call [phone]-TALK (toll free, 24 hour hotline) if you are experiencing a Mental Health or Behavioral Health Crisis or need someone to talk to.  Louanne Roussel RN BSN CCM   [facility], [facility] Nurse Care Coordinator  Direct Dial: [phone] Website: [email or web address]

## 2023-07-15 NOTE — Telephone Encounter (Signed)
 [phone] [facility]

## 2023-07-16 ENCOUNTER — Encounter: Payer: Self-pay | Admitting: Nurse Practitioner

## 2023-07-16 ENCOUNTER — Other Ambulatory Visit: Payer: Self-pay | Admitting: Licensed Clinical Social Worker

## 2023-07-17 ENCOUNTER — Telehealth: Payer: Self-pay | Admitting: Pharmacist

## 2023-07-17 ENCOUNTER — Other Ambulatory Visit: Payer: Self-pay | Admitting: Nurse Practitioner

## 2023-07-17 DIAGNOSIS — M5442 Lumbago with sciatica, left side: Secondary | ICD-10-CM

## 2023-07-17 DIAGNOSIS — R0602 Shortness of breath: Secondary | ICD-10-CM

## 2023-07-17 DIAGNOSIS — Z79899 Other long term (current) drug therapy: Secondary | ICD-10-CM

## 2023-07-17 NOTE — Patient Instructions (Addendum)
  Current Outpatient Medications (Endocrine & Metabolic):    dapagliflozin  propanediol (FARXIGA ) 10 MG TABS tablet, Take 1 tablet (10 mg total) by mouth daily before breakfast.   Current Outpatient Medications (Cardiovascular):    atorvastatin  (LIPITOR) 80 MG tablet, TAKE 1 TABLET BY MOUTH ONCE  DAILY   ezetimibe  (ZETIA ) 10 MG tablet, Take 1 tablet (10 mg total) by mouth daily.   olmesartan  (BENICAR ) 20 MG tablet, Take 1 tablet (20 mg total) by mouth daily.   Current Outpatient Medications (Respiratory):    albuterol  (VENTOLIN  HFA) 108 (90 Base) MCG/ACT inhaler, Inhale 2 puffs into the lungs every 6 (six) hours as needed for wheezing or shortness of breath.   benzonatate  (TESSALON  PERLES) 100 MG capsule, Take 1 capsule (100 mg total) by mouth 3 (three) times daily as needed. (Patient not taking: Reported on 07/15/2023)   budesonide -glycopyrrolate-formoterol  (BREZTRI  AEROSPHERE) 160-9-4.8 MCG/ACT AERO inhaler, Inhale 2 puffs into the lungs daily. (Patient not taking: Reported on 07/15/2023)   levocetirizine (XYZAL ) 5 MG tablet, Take 1 tablet (5 mg total) by mouth every evening. (Patient not taking: Reported on 07/15/2023)   loratadine  (CLARITIN ) 10 MG tablet, Take 1 tablet (10 mg total) by mouth daily.   mometasone  (NASONEX ) 50 MCG/ACT nasal spray, Place 2 sprays into the nose daily.   Olopatadine  HCl 0.6 % SOLN, Place 2 sprays into the nose 2 (two) times daily as needed. (Patient not taking: Reported on 07/15/2023)   Current Outpatient Medications (Analgesics):    acetaminophen  (TYLENOL ) 500 MG tablet, Take 1 tablet (500 mg total) by mouth every 6 (six) hours as needed.   Current Outpatient Medications (Hematological):    clopidogrel  (PLAVIX ) 75 MG tablet, TAKE 1 TABLET BY MOUTH DAILY   Current Outpatient Medications (Other):    cholecalciferol (VITAMIN D3) 25 MCG (1000 UNIT) tablet, Take 1,000 Units by mouth daily.   desonide (DESOWEN) 0.05 % ointment, Apply 1 Application topically 2 (two)  times daily. for itchy rash (Patient not taking: Reported on 07/15/2023)   diclofenac Sodium (VOLTAREN) 1 % GEL, Apply topically 4 (four) times daily. (Patient not taking: Reported on 07/15/2023)   donepezil  (ARICEPT ) 10 MG tablet, TAKE 1 TABLET BY MOUTH AT  BEDTIME   gabapentin  (NEURONTIN ) 100 MG capsule, TAKE 1 CAPSULE BY MOUTH 3 TIMES  DAILY AS NEEDED   glucose blood (ONETOUCH ULTRA) test strip, USE WITH METER TO CHECK BLOOD SUGAR BEFORE BREAKFAST AND BEFORE DINNER   glucose blood (ONETOUCH ULTRA) test strip, USE AS DIRECTED WITH  METER  TO  CHECK  BLOOD  SUGAR  BEFORE  BREAKFAST  AND  DINNER   meclizine  (ANTIVERT ) 25 MG tablet, Take 1 tablet (25 mg total) by mouth 3 (three) times daily as needed for dizziness.   pantoprazole  (PROTONIX ) 40 MG tablet, TAKE 1 TABLET BY MOUTH DAILY   vitamin C  (ASCORBIC ACID ) 500 MG tablet, Take 500 mg by mouth daily.   VITAMIN D  PO, Take 1 tablet by mouth daily.  Current Facility-Administered Medications (Other):    0.9 %  sodium chloride  infusion

## 2023-07-17 NOTE — Progress Notes (Unsigned)
 07/18/2023 Name: Donna Gilmore MRN: [id number] DOB: 22-Feb-1940  Chief Complaint  Patient presents with   Medication Management    Medication Review    Donna Gilmore is a 83 y.o. year old female who presented for a telephone visit.   They were referred to the pharmacist by their PCP for assistance in managing complex medication management.    Subjective: Donna Gilmore is an 83 year old female who contacted the Office clinical staff via My Chart and requested a list of all of her medications with their indications.  She has a past medical history significant for:  type 2 diabetes, CKD stage 3, vertigo, COPD, cognitive impairment, Hyperlipidemia, history of tobacco abuse.   Care Team: Primary Care Provider: Susanna Epley, Gilmore ; Next Scheduled Visit: 10/16/2023  Medication Access/Adherence  Current Pharmacy:  [id number] - [address], [place] - [address] [facility] [address] [place] [address] Phone: [phone] Fax: [phone]  [facility]) - [place], [place] - [address] [facility] [address] [place] [address] [place] [address] Phone: [phone] Fax: [phone]  [facility] [place] - [place], [place] - [address] [place] [address] Phone: [phone] Fax: [phone]  [facility] - [place], [place] - [address] [place] [address] [place] [address] Phone: [phone] Fax: [phone]   Patient reports affordability concerns with their medications: No  Patient reports access/transportation concerns to their pharmacy: No  Patient reports adherence concerns with their medications:  No  Patient's son reported putting all of her medications in a pill box for her weekly.   Medication review was completed with Patient's son , Donna Gilmore.  Mr. Donna Gilmore was with the Patient's actual medications at the time of the review.       Objective:  Lab Results  Component Value Date   HGBA1C 8.1 (H) 05/05/2023    Lab Results  Component Value Date   CREATININE 1.62 (H) 07/13/2023   BUN 16 07/13/2023   NA 138 07/13/2023   K 3.8 07/13/2023   CL 104 07/13/2023   CO2 21 (L) 07/13/2023    Lab Results  Component Value Date   CHOL 159 05/05/2023   HDL 51 05/05/2023   LDLCALC 72 05/05/2023   TRIG 217 (H) 05/05/2023   CHOLHDL 3.1 05/05/2023    Medications Reviewed Today     Reviewed by Donna Gilmore, RPH (Pharmacist) on 07/18/23 at 1029  Med List Status: <None>   Medication Order Taking? Sig Documenting Provider Last Dose Status Informant  0.9 %  sodium chloride  infusion [id number]   Donna Forts, Gilmore  Active   acetaminophen  (TYLENOL ) 500 MG tablet [id number] Yes Take 1 tablet (500 mg total) by mouth every 6 (six) hours as needed. Donna Dice, Gilmore  Active   albuterol  (VENTOLIN  HFA) 108 [phone] Base) MCG/ACT inhaler [id number] Yes Inhale 2 puffs into the lungs every 6 (six) hours as needed for wheezing or shortness of breath. Donna Epley, Gilmore  Active   atorvastatin  (LIPITOR) 80 MG tablet [id number] Yes TAKE 1 TABLET BY MOUTH ONCE  DAILY Gilmore, Janece, Gilmore  Active    Patient not taking:   Discontinued 07/17/23 1744 (Completed Course)            Med Note>> [facility]   07/17/2023  5:42 PM Reported by patient's son    budesonide -glycopyrrolate-formoterol  (BREZTRI  AEROSPHERE)  160-9-4.8 MCG/ACT AERO inhaler [id number] Yes Inhale 2 puffs into the lungs daily. Donna Epley, Gilmore  Active   cholecalciferol (VITAMIN D3) 25 MCG (1000 UNIT) tablet [id number] Yes Take 1,000 Units by mouth daily. Provider, Historical, Gilmore  Active   citalopram  (CELEXA ) 20 MG tablet [id number] Yes Take 20 mg by mouth daily. Provider, Historical, Gilmore  Active   clopidogrel  (PLAVIX ) 75 MG tablet [id number]  TAKE 1 TABLET BY MOUTH DAILY  Patient not taking: Reported on 07/18/2023   Donna Epley, Gilmore  Active   dapagliflozin  propanediol  (FARXIGA ) 10 MG TABS tablet [id number] Yes Take 1 tablet (10 mg total) by mouth daily before breakfast. Donna Epley, Gilmore  Active   desonide (DESOWEN) 0.05 % ointment [id number]  Apply 1 Application topically 2 (two) times daily. for itchy rash  Patient not taking: Reported on 07/18/2023   Donna Gilmore  Consider Medication Status and Discontinue (Completed Course)    Discontinued 07/18/23 1023 (Completed Course)   donepezil  (ARICEPT ) 10 MG tablet [id number] Yes TAKE 1 TABLET BY MOUTH AT  BEDTIME Donna Epley, Gilmore  Active   ezetimibe  (ZETIA ) 10 MG tablet [id number] Yes Take 1 tablet (10 mg total) by mouth daily. Donna Epley, Gilmore  Active   gabapentin  (NEURONTIN ) 100 MG capsule [id number] Yes TAKE 1 CAPSULE BY MOUTH 3 TIMES  DAILY AS NEEDED Donna Epley, Gilmore  Active   glucose blood (ONETOUCH ULTRA) test strip [id number] Yes USE WITH METER TO CHECK BLOOD SUGAR BEFORE BREAKFAST AND BEFORE Donna Gilmore, Donna Able, Gilmore  Active   glucose blood (ONETOUCH ULTRA) test strip [id number] Yes USE AS DIRECTED WITH  METER  TO  CHECK  BLOOD  SUGAR  BEFORE  BREAKFAST  AND  Donna Gilmore  Active    Patient not taking:   Discontinued 07/18/23 1006 (Change in therapy)   lidocaine  (LIDODERM ) 5 % [id number] Yes Place 1 patch onto the skin daily. Provider, Historical, Gilmore  Active   loratadine  (CLARITIN ) 10 MG tablet [id number] Yes Take 1 tablet (10 mg total) by mouth daily. Donna Gilmore  Active   loratadine  (CLARITIN ) 10 MG tablet [id number] Yes Take 10 mg by mouth daily. Provider, Historical, Gilmore  Active   meclizine  (ANTIVERT ) 25 MG tablet [id number] Yes Take 1 tablet (25 mg total) by mouth 3 (three) times daily as needed for dizziness. Donna Keller, DO  Active   mometasone  (NASONEX ) 50 MCG/ACT nasal spray [id number] Yes Place 2 sprays into the nose daily. Donna Epley, Gilmore  Active   olmesartan  (BENICAR ) 20 MG tablet [id number] Yes Take 1 tablet (20 mg total) by mouth daily. Donna Epley,  Gilmore  Active            Med Note Suszanne Eriksson, Koleen Perna   Fri Jul 18, 2023 10:01 AM)    Olopatadine  HCl 0.6 % SOLN [id number] Yes Place 2 sprays into the nose 2 (two) times daily as needed. Donna Gilmore  Active   pantoprazole  (PROTONIX ) 40 MG tablet [id number] Yes TAKE 1 TABLET BY MOUTH DAILY [name], NP  Active   ULTRAM 50 MG tablet [id number] Yes Take 50 mg by mouth in the morning, at noon, and at bedtime. Provider, Historical, Gilmore  Active   vitamin C  (ASCORBIC ACID ) 500 MG tablet [id number] Yes Take 500 mg by mouth daily. Provider, Historical, Gilmore  Active Self  VITAMIN D  PO [id number] Yes Take 1 tablet by mouth daily. Provider, Historical, Gilmore  Active  Assessment/Plan:   Diabetes: - Currently controlled HgA1c- 8.1 (controlled for Patient's age and comorbidities) - Recommend to continue current therapy. (Farxiga  10 mg)  On Atorvastatin  last filled:  06/04/2023 #90  Patient's son said his mother was not using diclofenac gel so it was removed from her medication list.  Then after we had a discussion on what it is used for, he requested a message be sent to Donna Epley, Gilmore requesting a new prescription for Diclofenac.  A list of Patient's medications separated by drug class will be sent to the Patient via MyChart. Printing medications by therapeutic class can be done through Epic and I will place notes beside.  An official medication list with associated diagnoses has been requested through IT.  All Patient's meds would need to be associated first but we are unsure if it is even possible to run the report.   The following will be sent to the Patient- from using smart phrase:  medrxclass  Current Outpatient Medications (Endocrine & Metabolic):    dapagliflozin  propanediol (FARXIGA ) 10 MG TABS tablet, Take 1 tablet (10 mg total) by mouth daily before breakfast.  Current Outpatient Medications (Cardiovascular   atorvastatin  (LIPITOR) 80 MG tablet, TAKE 1 TABLET  BY MOUTH ONCE  DAILY    ezetimibe  (ZETIA ) 10 MG tablet, Take 1 tablet (10 mg total) by mouth daily.   olmesartan  (BENICAR ) 20 MG tablet, Take 1 tablet (20 mg total) by mouth daily.  Current Outpatient Medications (Respiratory):    albuterol  (VENTOLIN  HFA) 108 (90 Base) MCG/ACT inhaler, Inhale 2 puffs into the lungs every 6 (six) hours as needed for wheezing or shortness of breath.   budesonide -glycopyrrolate-formoterol  (BREZTRI  AEROSPHERE) 160-9-4.8 MCG/ACT AERO inhaler, Inhale 2 puffs into the lungs daily.   loratadine  (CLARITIN ) 10 MG tablet, Take 1 tablet (10 mg total) by mouth daily.   loratadine  (CLARITIN ) 10 MG tablet, Take 10 mg by mouth daily.   mometasone  (NASONEX ) 50 MCG/ACT nasal spray, Place 2 sprays into the nose daily.   Olopatadine  HCl 0.6 % SOLN, Place 2 sprays into the nose 2 (two) times daily as needed.  Current Outpatient Medications (Analgesics):    acetaminophen  (TYLENOL ) 500 MG tablet, Take 1 tablet (500 mg total) by mouth every 6 (six) hours as needed.   ULTRAM 50 MG tablet, Take 50 mg by mouth in the morning, at noon, and at bedtime.  Current Outpatient Medications (Hematological):    clopidogrel  (PLAVIX ) 75 MG tablet, TAKE 1 TABLET BY MOUTH DAILY (Patient not taking: Reported on 07/18/2023)    Current Outpatient Medications (Other):    cholecalciferol (VITAMIN D3) 25 MCG (1000 UNIT) tablet, Take 1,000 Units by mouth daily.   citalopram  (CELEXA ) 20 MG tablet, Take 20 mg by mouth daily.   donepezil  (ARICEPT ) 10 MG tablet, TAKE 1 TABLET BY MOUTH AT  BEDTIME   gabapentin  (NEURONTIN ) 100 MG capsule, TAKE 1 CAPSULE BY MOUTH 3 TIMES  DAILY AS NEEDED   glucose blood (ONETOUCH ULTRA) test strip, USE WITH METER TO CHECK BLOOD SUGAR BEFORE BREAKFAST AND BEFORE DINNER   glucose blood (ONETOUCH ULTRA) test strip, USE AS DIRECTED WITH  METER  TO  CHECK  BLOOD  SUGAR  BEFORE  BREAKFAST  AND  DINNER   lidocaine  (LIDODERM ) 5 %, Place 1 patch onto the skin daily.   meclizine   (ANTIVERT ) 25 MG tablet, Take 1 tablet (25 mg total) by mouth 3 (three) times daily as needed for dizziness.   pantoprazole  (PROTONIX ) 40 MG tablet, TAKE 1 TABLET BY  MOUTH DAILY   vitamin C  (ASCORBIC ACID ) 500 MG tablet, Take 500 mg by mouth daily.   VITAMIN D  PO, Take 1 tablet by mouth daily.   desonide (DESOWEN) 0.05 % ointment, Apply 1 Application topically 2 (two) times daily. for itchy rash (Patient not taking: Reported on 07/18/2023)     Follow Up Plan:    Send medication list to Patient via MyChart Messaging.  Follow up next week for an guidance on a better list and to see if they are interested in adherence packaging.   Donna Gilmore, PharmD, [facility] Clinical Pharmacist [facility] [phone]

## 2023-07-17 NOTE — Patient Instructions (Signed)
 Visit Information  Thank you for taking time to visit with me today. Please don't hesitate to contact me if I can be of assistance to you before our next scheduled appointment.  Your next care management appointment is by telephone on 07/30/2023 at 2:00 pm   Please call the care guide team at [phone] if you need to cancel, schedule, or reschedule an appointment.   Please call the Suicide and Crisis Lifeline: 988 go to [facility] Urgent [facility] [address], [place] [phone]) call 911 if you are experiencing a Mental Health or Behavioral Health Crisis or need someone to talk to.  Jonda Neighbours, PhD [facility], [facility] Social Worker Direct Dial: [phone]  Fax: [phone]

## 2023-07-17 NOTE — Patient Outreach (Signed)
 Complex Care Management   Visit Note  07/17/2023  Name:  Donna Gilmore MRN: [id number] DOB: Apr 05, 1940  Situation: Referral received for Complex Care Management related to Increased [employer] service hours I obtained verbal consent from Patient.  Visit completed with patient  on the phone  Background:   Past Medical History:  Diagnosis Date   Acute left-sided low back pain with left-sided sciatica 05/04/2018   Benign paroxysmal positional vertigo 08/26/2012   Chest pain 09/21/2012   CVA (cerebral infarction) 08/25/2012   Diabetes mellitus without complication (HCC)    Dyslipidemia 08/25/2012   Essential hypertension, benign 08/25/2012   Hyperlipidemia    Hypertension    Meralgia paraesthetica, left 01/12/2018   Stroke [facility])     Assessment: Patients son wants hous changed and increased to M, W, and [place] to 4 hours per day, already receiving services from [employer] [place] [address] [place] 2 hours a day, SW sent a message to the PCP   Recommendation:   none  Follow Up Plan:   Telephone follow up appointment date/time:  07/30/2023 at 2:00 pm  Jonda Neighbours, PhD [facility], [facility] Social Worker Direct Dial: [phone]  Fax: [phone]

## 2023-07-19 ENCOUNTER — Other Ambulatory Visit: Payer: Self-pay | Admitting: Nurse Practitioner

## 2023-07-21 ENCOUNTER — Telehealth: Payer: Self-pay

## 2023-07-21 NOTE — Telephone Encounter (Signed)
 Copied from CRM [id number]. Topic: Clinical - Medication Question >> Jul 21, 2023 11:59 AM Treva T wrote: Reason for CRM: Received call form patient son, Jerrell ([place] verified). Patient son has questions about previously prescribed medications, and wanted to make sure they are safe for patient to be taking.   Name of medications are, Olopatadine  HCl 0.6 % SOLN nasal, and Diclofenac gel.  Patient son can be reached at [phone] to discuss further.  SENT MYCHART MESSAGE TO PATIENT.

## 2023-07-23 NOTE — Assessment & Plan Note (Signed)
 She has not been using her albuterol  inhaler, I educated her on how to use and she is to use eery 6 hours as needed. Continue maintenance inhaler

## 2023-07-23 NOTE — Assessment & Plan Note (Signed)
 Sent Rx for mometasone  nasal spray.

## 2023-07-23 NOTE — Assessment & Plan Note (Signed)
 She is to have a steroid injection, she is to monitor her blood sugars

## 2023-07-23 NOTE — Assessment & Plan Note (Signed)
 Seen in ER for dizziness, she was given IV fluids with some relief. She is also taking meclizine  and has had improvement

## 2023-07-25 ENCOUNTER — Telehealth: Payer: Self-pay | Admitting: Physical Medicine and Rehabilitation

## 2023-07-25 NOTE — Telephone Encounter (Signed)
 Please call this patient and let her know the lumbar MRI report was finished and they did show a small left-sided disc herniation at L5-S1.  She should talk to Dr. Bonner or Dr. Gillie about treatment.  She might do well with an injection by Dr. Bonner.  She has been seeing them prior to our evaluation.  Per Megan's note she was going to follow-up with Dr. Bonner.

## 2023-07-28 ENCOUNTER — Other Ambulatory Visit: Payer: Self-pay

## 2023-07-29 NOTE — Patient Outreach (Signed)
 Complex Care Management   Visit Note  07/28/2023  Name:  Donna Gilmore MRN: [id number] DOB: September 09, 1940  Situation: Referral received for Complex Care Management related to hx of CVA, Diabetes with Complications, and Sensorineural hearing loss of both ears, chronic back and shoulder pain, mild cognitive impairment, fatigue, Benign paroxysmal positional vertigo, COPD with Asthma, OSA. I obtained verbal consent from Patient.  Visit completed with patient on the phone.  Background:   Past Medical History:  Diagnosis Date   Acute left-sided low back pain with left-sided sciatica 05/04/2018   Benign paroxysmal positional vertigo 08/26/2012   Chest pain 09/21/2012   CVA (cerebral infarction) 08/25/2012   Diabetes mellitus without complication (HCC)    Dyslipidemia 08/25/2012   Essential hypertension, benign 08/25/2012   Hyperlipidemia    Hypertension    Meralgia paraesthetica, left 01/12/2018   Stroke [facility])     Assessment: Patient Reported Symptoms:  Cognitive Cognitive Status: Alert and oriented to person, place, and time Cognitive/Intellectual Conditions Management [RPT]: Other Other: Mild Cognitive Impairment   Health Maintenance Behaviors: Annual physical exam Health Facilitated by: Pain control  Neurological Neurological Review of Symptoms: Dizziness, Hearing changes Oher Neurological Symptoms/Conditions [RPT]: bilateral neurosensory hearing loss; Benign paroxysmal postional vertigo Neurological Management Strategies: Medication therapy, Medical device, Routine screening Neurological Self-Management Outcome: 4 (good)  HEENT HEENT Symptoms Reported: Change or loss of hearing HEENT Management Strategies: Routine screening, Medical device HEENT Self-Management Outcome: 3 (uncertain) HEENT Comment: patient uses telecaptioning for help with phone communication; wears hearing aids Ear problem(s)  Cardiovascular Cardiovascular Symptoms Reported: Dizziness, Fatigue Does patient  have uncontrolled Hypertension?: No Cardiovascular Management Strategies: Medication therapy, Adequate rest, Routine screening Cardiovascular Self-Management Outcome: 4 (good) Cardiovascular Comment: patient states symptoms are better today  Respiratory Respiratory Symptoms Reported: Shortness of breath Respiratory Management Strategies: Routine screening, Adequate rest, Medication therapy Respiratory Self-Management Outcome: 3 (uncertain)  Endocrine Endocrine Symptoms Reported: Not assessed    Gastrointestinal Gastrointestinal Symptoms Reported: Not assessed      Genitourinary Genitourinary Symptoms Reported: Not assessed    Integumentary Integumentary Symptoms Reported: Not assessed    Musculoskeletal Musculoskelatal Symptoms Reviewed: Difficulty walking, Weakness, Limited mobility, Muscle pain, Joint pain Additional Musculoskeletal Details: Left shoulder pain, Radiculopathy, lumbar region with small left-sided disc herniation at L5-S1. Patient is scheduled for lumbar spinal injection per Dr. Bonner on 08/06/23 Musculoskeletal Management Strategies: Medication therapy, Medical device, Routine screening Musculoskeletal Self-Management Outcome: 3 (uncertain) Falls in the past year?: No Number of falls in past year: 1 or less Was there an injury with Fall?: No Fall Risk Category Calculator: 0 Patient Fall Risk Level: Low Fall Risk Patient at Risk for Falls Due to: Impaired balance/gait, Impaired mobility Fall risk Follow up: Falls evaluation completed, Follow up appointment  Psychosocial Psychosocial Symptoms Reported: Not assessed     Quality of Family Relationships: supportive, involved, helpful      07/03/2023    4:51 PM  Depression screen PHQ 2/9  Decreased Interest 0  Down, Depressed, Hopeless 0  PHQ - 2 Score 0  Altered sleeping 0  Tired, decreased energy 0  Change in appetite 0  Feeling bad or failure about yourself  0  Trouble concentrating 0  Moving slowly or  fidgety/restless 0  Suicidal thoughts 0  PHQ-9 Score 0  Difficult doing work/chores Not difficult at all    There were no vitals filed for this visit.  Medications Reviewed Today     Reviewed by Morgan Clayborne CROME, RN (Registered Nurse) on 07/28/23 at [phone]  Med List Status: <None>   Medication Order Taking? Sig Documenting Provider Last Dose Status Informant  0.9 %  sodium chloride  infusion [id number]   Abran Norleen SAILOR, MD  Active   acetaminophen  (TYLENOL ) 500 MG tablet [id number]  Take 1 tablet (500 mg total) by mouth every 6 (six) hours as needed. Blaise Aleene KIDD, MD  Active   albuterol  (VENTOLIN  HFA) [phone] Base) MCG/ACT inhaler [id number]  Inhale 2 puffs into the lungs every 6 (six) hours as needed for wheezing or shortness of breath. Georgina Speaks, FNP  Active   atorvastatin  (LIPITOR) 80 MG tablet [id number]  TAKE 1 TABLET BY MOUTH ONCE  DAILY Moore, Janece, FNP  Active   budesonide -glycopyrrolate-formoterol  (BREZTRI  AEROSPHERE) 160-9-4.8 MCG/ACT AERO inhaler [id number]  Inhale 2 puffs into the lungs daily. Georgina Speaks, FNP  Active   cholecalciferol (VITAMIN D3) 25 MCG (1000 UNIT) tablet [id number]  Take 1,000 Units by mouth daily. [provider]  Active   citalopram  (CELEXA ) 20 MG tablet [id number]  Take 20 mg by mouth daily. [provider]  Active   clopidogrel  (PLAVIX ) 75 MG tablet [id number]  TAKE 1 TABLET BY MOUTH DAILY  Patient not taking: Reported on 07/18/2023   Georgina Speaks, FNP  Active   dapagliflozin  propanediol (FARXIGA ) 10 MG TABS tablet [id number]  Take 1 tablet (10 mg total) by mouth daily before breakfast. Georgina Speaks, FNP  Active   desonide (DESOWEN) 0.05 % ointment [id number]  Apply 1 Application topically 2 (two) times daily. for itchy rash  Patient not taking: Reported on 07/18/2023   Jeneal Danita Macintosh, MD  Active   donepezil  (ARICEPT ) 10 MG tablet [id number]  TAKE 1 TABLET BY MOUTH AT  BEDTIME Georgina Speaks, FNP  Active   ezetimibe  (ZETIA ) 10 MG  tablet [id number]  Take 1 tablet (10 mg total) by mouth daily. Georgina Speaks, FNP  Active   gabapentin  (NEURONTIN ) 100 MG capsule [id number]  TAKE 1 CAPSULE BY MOUTH 3 TIMES  DAILY AS NEEDED Georgina Speaks, FNP  Active   glucose blood (ONETOUCH ULTRA) test strip [id number]  USE WITH METER TO CHECK BLOOD SUGAR BEFORE BREAKFAST AND BEFORE [name] Georgina, Speaks, FNP  Active   glucose blood (ONETOUCH ULTRA) test strip [id number]  USE AS DIRECTED WITH  METER  TO  CHECK  BLOOD  SUGAR  BEFORE  BREAKFAST  AND  [name] Georgina Speaks, FNP  Active   lidocaine  (LIDODERM ) 5 % [id number]  Place 1 patch onto the skin daily. [provider]  Active   loratadine  (CLARITIN ) 10 MG tablet [id number]  Take 1 tablet (10 mg total) by mouth daily. Jeneal Danita Macintosh, MD  Active   loratadine  (CLARITIN ) 10 MG tablet [id number]  Take 10 mg by mouth daily. [provider]  Active   meclizine  (ANTIVERT ) 25 MG tablet [id number]  Take 1 tablet (25 mg total) by mouth 3 (three) times daily as needed for dizziness. Sherrine Birmingham, DO  Active   mometasone  (NASONEX ) 50 MCG/ACT nasal spray [id number]  Place 2 sprays into the nose daily. Georgina Speaks, FNP  Active   olmesartan  (BENICAR ) 20 MG tablet [id number]  Take 1 tablet (20 mg total) by mouth daily. Georgina Speaks, FNP  Active            Med Note MACK, CASSIUS PARAS   Fri Jul 18, 2023 10:01 AM)    Olopatadine  HCl 0.6 % SOLN [id number]  Place 2 sprays into the nose 2 (two) times daily as needed. Jeneal Danita Macintosh, MD  Active   pantoprazole  (PROTONIX ) 40 MG tablet [id number]  TAKE 1 TABLET BY MOUTH DAILY Petrina Pries, NP  Active   ULTRAM 50 MG tablet [id number]  Take 50 mg by mouth in the morning, at noon, and at bedtime. [provider]  Active   vitamin C  (ASCORBIC ACID ) 500 MG tablet [id number]  Take 500 mg by mouth daily. [provider]  Active Self  VITAMIN D  PO [id number]  Take 1 tablet by mouth daily. [provider]  Active              Recommendation:   Specialty provider follow-up with Dr. Bonner on 08/06/23 for lumbar spinal injection as directed   Follow Up Plan:   Face to Face appointment date/time: Thursday, July 3 at 12:30 PM  Clayborne Ly RN BSN CCM [place]  [facility], [facility] Health Nurse Care Coordinator  Direct Dial: [phone] Website: [email or web address]

## 2023-07-29 NOTE — Patient Instructions (Signed)
 Visit Information  Thank you for taking time to visit with me today. Please don't hesitate to contact me if I can be of assistance to you before our next scheduled appointment.  Your next care management appointment is in-person at @PCP @'s office on 07/31/23 at 12:30 PM  Please call the care guide team at [phone] if you need to cancel, schedule, or reschedule an appointment.   Please call [phone]-TALK (toll free, 24 hour hotline) if you are experiencing a Mental Health or Behavioral Health Crisis or need someone to talk to.  Clayborne Ly RN BSN CCM [place]  [facility] : [facility], [facility] Nurse Care Coordinator  Direct Dial: [phone] Website: [email or web address]

## 2023-07-30 ENCOUNTER — Other Ambulatory Visit: Payer: Self-pay | Admitting: Licensed Clinical Social Worker

## 2023-07-31 ENCOUNTER — Other Ambulatory Visit: Payer: Self-pay

## 2023-07-31 DIAGNOSIS — E1169 Type 2 diabetes mellitus with other specified complication: Secondary | ICD-10-CM

## 2023-07-31 DIAGNOSIS — J4489 Other specified chronic obstructive pulmonary disease: Secondary | ICD-10-CM

## 2023-07-31 DIAGNOSIS — H903 Sensorineural hearing loss, bilateral: Secondary | ICD-10-CM

## 2023-08-04 NOTE — Patient Outreach (Signed)
 Complex Care Management   Visit Note  07/31/2023  Name:  Donna Gilmore MRN: [id number] DOB: 09-06-1940  Situation: Referral received for Complex Care Management related to CVA, Diabetes with Complications, and Sensorineural hearing loss of both ears, chronic back and shoulder pain, mild cognitive impairment, fatigue, Benign paroxysmal positional vertigo, COPD with Asthma, OSA, depression. I obtained verbal consent from Patient.  Visit completed with patient in the office.  Background:   Past Medical History:  Diagnosis Date   Acute left-sided low back pain with left-sided sciatica 05/04/2018   Benign paroxysmal positional vertigo 08/26/2012   Chest pain 09/21/2012   CVA (cerebral infarction) 08/25/2012   Diabetes mellitus without complication (HCC)    Dyslipidemia 08/25/2012   Essential hypertension, benign 08/25/2012   Hyperlipidemia    Hypertension    Meralgia paraesthetica, left 01/12/2018   Stroke [facility])     Assessment: Patient Reported Symptoms:  Cognitive Cognitive Status: Alert and oriented to person, place, and time, Struggling with memory recall Cognitive/Intellectual Conditions Management [RPT]: Other Other: Mild Cognitive Impairment   Health Maintenance Behaviors: Annual physical exam Health Facilitated by: Pain control  Neurological Neurological Review of Symptoms: Dizziness, Headaches Oher Neurological Symptoms/Conditions [RPT]: bilateral neurosensory hearing loss; Benign paroxysmal positional vertigo Neurological Management Strategies: Medication therapy, Medical device, Routine screening Neurological Self-Management Outcome: 4 (good)  HEENT HEENT Symptoms Reported: Change or loss of hearing HEENT Management Strategies: Medication therapy, Routine screening HEENT Self-Management Outcome: 4 (good) Ear problem(s)  Cardiovascular Cardiovascular Symptoms Reported: Not assessed    Respiratory Respiratory Symptoms Reported: Not assesed    Endocrine Endocrine  Symptoms Reported: Not assessed    Gastrointestinal Gastrointestinal Symptoms Reported: Not assessed      Genitourinary Genitourinary Symptoms Reported: Not assessed    Integumentary Integumentary Symptoms Reported: Not assessed    Musculoskeletal Musculoskelatal Symptoms Reviewed: Difficulty walking, Limited mobility, Unsteady gait Additional Musculoskeletal Details: Left shoulder pain, Radiculopathy, lumbar region with small left-sided disc herniation at L5-S1. Patient is scheduled for lumbar spinal injection per Dr. Bonner on 08/06/23 Musculoskeletal Management Strategies: Medication therapy, Medical device, Routine screening Musculoskeletal Self-Management Outcome: 4 (good) Falls in the past year?: No Number of falls in past year: 1 or less Was there an injury with Fall?: No Fall Risk Category Calculator: 0 Patient Fall Risk Level: Low Fall Risk Patient at Risk for Falls Due to: Impaired balance/gait, Impaired mobility Fall risk Follow up: Falls evaluation completed, Education provided, Falls prevention discussed  Psychosocial Psychosocial Symptoms Reported: Depression - if selected complete PHQ 2-9 Behavioral Management Strategies: Medication therapy, Support system Behavioral Health Self-Management Outcome: 3 (uncertain) Major Change/Loss/Stressor/Fears (CP): Medical condition, self Techniques to Cope with Loss/Stress/Change: Medication, Diversional activities Quality of Family Relationships: involved, helpful, supportive Do you feel physically threatened by others?: No      07/31/2023   12:40 PM  Depression screen PHQ 2/9  Decreased Interest 3  Down, Depressed, Hopeless 2  PHQ - 2 Score 5  Altered sleeping 0  Tired, decreased energy 0  Change in appetite 0  Feeling bad or failure about yourself  0  Trouble concentrating 0  Moving slowly or fidgety/restless 0  Suicidal thoughts 0  PHQ-9 Score 5    There were no vitals filed for this visit.  Medications Reviewed Today      Reviewed by Morgan Clayborne CROME, RN (Registered Nurse) on 07/31/23 at 1723  Med List Status: <None>   Medication Order Taking? Sig Documenting Provider Last Dose Status Informant  0.9 %  sodium chloride  infusion [id number]  Abran Norleen SAILOR, MD  Active   acetaminophen  (TYLENOL ) 500 MG tablet [id number]  Take 1 tablet (500 mg total) by mouth every 6 (six) hours as needed. Blaise Aleene KIDD, MD  Active   albuterol  (VENTOLIN  HFA) 108 [phone] Base) MCG/ACT inhaler [id number]  Inhale 2 puffs into the lungs every 6 (six) hours as needed for wheezing or shortness of breath. Georgina Speaks, FNP  Active   atorvastatin  (LIPITOR) 80 MG tablet [id number]  TAKE 1 TABLET BY MOUTH ONCE  DAILY Moore, Janece, FNP  Active   budesonide -glycopyrrolate-formoterol  (BREZTRI  AEROSPHERE) 160-9-4.8 MCG/ACT AERO inhaler [id number]  Inhale 2 puffs into the lungs daily. Georgina Speaks, FNP  Active   cholecalciferol (VITAMIN D3) 25 MCG (1000 UNIT) tablet [id number]  Take 1,000 Units by mouth daily. [provider]  Active   citalopram  (CELEXA ) 20 MG tablet [id number]  Take 20 mg by mouth daily. [provider]  Active   clopidogrel  (PLAVIX ) 75 MG tablet [id number]  TAKE 1 TABLET BY MOUTH DAILY  Patient not taking: Reported on 07/18/2023   Georgina Speaks, FNP  Active   dapagliflozin  propanediol (FARXIGA ) 10 MG TABS tablet [id number]  Take 1 tablet (10 mg total) by mouth daily before breakfast. Georgina Speaks, FNP  Active   desonide (DESOWEN) 0.05 % ointment [id number]  Apply 1 Application topically 2 (two) times daily. for itchy rash  Patient not taking: Reported on 07/18/2023   Jeneal Danita Macintosh, MD  Active   donepezil  (ARICEPT ) 10 MG tablet [id number]  TAKE 1 TABLET BY MOUTH AT  BEDTIME Georgina Speaks, FNP  Active   ezetimibe  (ZETIA ) 10 MG tablet [id number]  Take 1 tablet (10 mg total) by mouth daily. Georgina Speaks, FNP  Active   gabapentin  (NEURONTIN ) 100 MG capsule [id number]  TAKE 1 CAPSULE BY MOUTH 3 TIMES  DAILY AS  NEEDED Georgina Speaks, FNP  Active   glucose blood (ONETOUCH ULTRA) test strip [id number]  USE WITH METER TO CHECK BLOOD SUGAR BEFORE BREAKFAST AND BEFORE [name] Georgina, Speaks, FNP  Active   glucose blood (ONETOUCH ULTRA) test strip [id number]  USE AS DIRECTED WITH  METER  TO  CHECK  BLOOD  SUGAR  BEFORE  BREAKFAST  AND  [name] Georgina Speaks, FNP  Active   lidocaine  (LIDODERM ) 5 % [id number]  Place 1 patch onto the skin daily. [provider]  Active   loratadine  (CLARITIN ) 10 MG tablet [id number]  Take 1 tablet (10 mg total) by mouth daily. Jeneal Danita Macintosh, MD  Active   loratadine  (CLARITIN ) 10 MG tablet [id number]  Take 10 mg by mouth daily. [provider]  Active   meclizine  (ANTIVERT ) 25 MG tablet [id number]  Take 1 tablet (25 mg total) by mouth 3 (three) times daily as needed for dizziness. Sherrine Birmingham, DO  Active   mometasone  (NASONEX ) 50 MCG/ACT nasal spray [id number]  Place 2 sprays into the nose daily. Georgina Speaks, FNP  Active   olmesartan  (BENICAR ) 20 MG tablet [id number]  Take 1 tablet (20 mg total) by mouth daily. Georgina Speaks, FNP  Active            Med Note MACK, CASSIUS PARAS   Fri Jul 18, 2023 10:01 AM)    Olopatadine  HCl 0.6 % SOLN [id number]  Place 2 sprays into the nose 2 (two) times daily as needed. Jeneal Danita Macintosh, MD  Active   pantoprazole  (PROTONIX ) 40 MG tablet [id number]  TAKE 1 TABLET BY MOUTH DAILY Ohenhen, Pat, NP  Active   ULTRAM 50 MG  tablet [id number]  Take 50 mg by mouth in the morning, at noon, and at bedtime. [provider]  Active   vitamin C  (ASCORBIC ACID ) 500 MG tablet [id number]  Take 500 mg by mouth daily. [provider]  Active Self  VITAMIN D  PO [id number]  Take 1 tablet by mouth daily. [provider]  Active             Recommendation:   Specialty provider follow-up with Dr. Bonner on 08/06/23 for lumbar spinal injection  Follow Up Plan:   Face to Face appointment date/time: Thursday, August 14  at 12:00 PM  Clayborne Ly RN BSN CCM [place]  [facility], [facility] Health Nurse Care Coordinator  Direct Dial: [phone] Website: [email or web address]

## 2023-08-04 NOTE — Patient Instructions (Signed)
 Visit Information  Visit Information  Thank you for taking time to visit with me today. Please don't hesitate to contact me if I can be of assistance to you before our next scheduled appointment.  Your next care management appointment is in-person at [facility] on Thursday, August 14 at 12:00 PM  Please call the care guide team at [phone] if you need to cancel, schedule, or reschedule an appointment.   Please call [phone]-TALK (toll free, 24 hour hotline) if you are experiencing a Mental Health or Behavioral Health Crisis or need someone to talk to.  Clayborne Ly RN BSN CCM [place]  [facility], [facility] Health Nurse Care Coordinator  Direct Dial: [phone] Website: [email or web address]

## 2023-08-05 ENCOUNTER — Other Ambulatory Visit: Payer: Self-pay | Admitting: Nurse Practitioner

## 2023-08-06 ENCOUNTER — Other Ambulatory Visit: Payer: Self-pay | Admitting: Nurse Practitioner

## 2023-08-06 DIAGNOSIS — M75122 Complete rotator cuff tear or rupture of left shoulder, not specified as traumatic: Secondary | ICD-10-CM | POA: Diagnosis not present

## 2023-08-06 DIAGNOSIS — F339 Major depressive disorder, recurrent, unspecified: Secondary | ICD-10-CM

## 2023-08-06 MED ORDER — SERTRALINE HCL 25 MG PO TABS: 25.0000 mg | ORAL_TABLET | Freq: Every day | ORAL | 2 refills | Status: DC

## 2023-08-06 MED ORDER — CITALOPRAM HYDROBROMIDE 20 MG PO TABS: ORAL_TABLET | ORAL | Status: DC

## 2023-08-11 ENCOUNTER — Telehealth: Payer: Self-pay

## 2023-08-11 NOTE — Progress Notes (Signed)
 Complex Care Management Note  Care Guide Note 08/11/2023 Name: Donna Gilmore MRN: [id number] DOB: Mar 22, 1940  Donna Gilmore is a 83 y.o. year old female who sees Georgina Speaks, FNP for primary care. I reached out to Inocente JINNY Poli by phone today to offer complex care management services.  Ms. Fetting was given information about Complex Care Management services today including:   The Complex Care Management services include support from the care team which includes your Nurse Care Manager, Clinical Social Worker, or Pharmacist.  The Complex Care Management team is here to help remove barriers to the health concerns and goals most important to you. Complex Care Management services are voluntary, and the patient may decline or stop services at any time by request to their care team member.   Complex Care Management Consent Status: Patient agreed to services and verbal consent obtained.   Follow up plan:  Telephone appointment with complex care management team member scheduled for:  08-28-23 @ 1 pm  Encounter Outcome:  Patient Scheduled  Leotis Rase [facility], [facility] Guide  Direct Dial: [phone]  Fax [phone]

## 2023-08-13 ENCOUNTER — Encounter: Payer: Self-pay | Admitting: Nurse Practitioner

## 2023-08-13 NOTE — Patient Outreach (Signed)
 Placed successful outbound call to patient and son Jerrell. Reviewed and discussed the following medication Citalopram  (Celexa ) will be changed to Sertraline . Reviewed the following Citalopram  titration schedule with patient and son Jerrell;  *citalopram  (CELEXA ) 20 MG tablet: Take 1 tab by mouth every other day for one week then take 1 tab by mouth every 2 days x 1 week. Then change to Sertraline  25 mg take 1 tablet daily.  *Confirmed the Sertraline  was electronically sent to patient's preferred pharmacy listed on 08/06/23  Reviewed and discussed patient's upcoming scheduled AWV scheduled for 08/20/23 at 3:20 PM  Reviewed and discussed patient's initial telephone appointment with Rolin Kerns LCSW scheduled for 08/28/23 at 1:00 PM  Reviewed and discussed patient's next scheduled nurse visit in person at [facility] scheduled for 09/11/23 at 12:00 PM  Clayborne Ly RN BSN CCM [place]  [facility], [facility] Health Nurse Care Coordinator  Direct Dial: [phone] Website: [email or web address]

## 2023-08-13 NOTE — Progress Notes (Signed)
 This encounter was created in error - please disregard.

## 2023-08-13 NOTE — Telephone Encounter (Signed)
 Copied from CRM [id number]. Topic: Clinical - Prescription Issue >> Aug 13, 2023  4:50 PM Chiquita SQUIBB wrote: Reason for CRM: Patients son is calling in stating that the pharmacy has not received the prescription for the citalopram  (CELEXA ) 20 MG tablet [[id number]]. The patient would like it to go to the [facility] pharmacy in the chart.

## 2023-08-14 ENCOUNTER — Other Ambulatory Visit: Payer: Self-pay | Admitting: Nurse Practitioner

## 2023-08-14 DIAGNOSIS — F331 Major depressive disorder, recurrent, moderate: Secondary | ICD-10-CM

## 2023-08-14 NOTE — Telephone Encounter (Signed)
 Copied from CRM [id number]. Topic: Clinical - Medication Refill >> Aug 14, 2023  9:07 AM Jalayah J wrote: Medication: citalopram  (CELEXA ) 20 MG tablet  Has the patient contacted their pharmacy? Yes (Agent: If no, request that the patient contact the pharmacy for the refill. If patient does not wish to contact the pharmacy document the reason why and proceed with request.) (Agent: If yes, when and what did the pharmacy advise?)  This is the patient's preferred pharmacy:  [facility] [address], [place] - [address] [place] [address] Phone: [phone] Fax: [phone]    Is this the correct pharmacy for this prescription? Yes If no, delete pharmacy and type the correct one.   Has the prescription been filled recently? No  Is the patient out of the medication? Yes  Has the patient been seen for an appointment in the last year OR does the patient have an upcoming appointment? Yes  Can we respond through MyChart? Yes  Agent: Please be advised that Rx refills may take up to 3 business days. We ask that you follow-up with your pharmacy.

## 2023-08-15 NOTE — Telephone Encounter (Signed)
 This encounter was created in error - please disregard.

## 2023-08-18 MED ORDER — CITALOPRAM HYDROBROMIDE 20 MG PO TABS: ORAL_TABLET | ORAL | Status: DC

## 2023-08-20 ENCOUNTER — Ambulatory Visit

## 2023-08-20 ENCOUNTER — Ambulatory Visit: Payer: Self-pay | Admitting: Pharmacist

## 2023-08-20 VITALS — BP 120/60 | HR 84 | Temp 97.8°F | Ht 61.0 in | Wt 182.8 lb

## 2023-08-20 DIAGNOSIS — E1122 Type 2 diabetes mellitus with diabetic chronic kidney disease: Secondary | ICD-10-CM

## 2023-08-20 DIAGNOSIS — Z0000 Encounter for general adult medical examination without abnormal findings: Secondary | ICD-10-CM | POA: Diagnosis not present

## 2023-08-20 MED ORDER — LANCET DEVICE MISC
0 refills | Status: AC
Start: 2023-08-20 — End: ?

## 2023-08-20 MED ORDER — BLOOD GLUCOSE TEST VI STRP: ORAL_STRIP | 12 refills | Status: DC

## 2023-08-20 MED ORDER — BLOOD GLUCOSE MONITORING SUPPL DEVI: 0 refills | Status: DC

## 2023-08-20 MED ORDER — LANCETS MISC. MISC
12 refills | Status: AC
Start: 2023-08-20 — End: ?

## 2023-08-20 NOTE — Progress Notes (Signed)
 Subjective:   Donna Gilmore is a 83 y.o. who presents for a Medicare Wellness preventive visit.  As a reminder, Annual Wellness Visits don't include a physical exam, and some assessments may be limited, especially if this visit is performed virtually. We may recommend an in-person follow-up visit with your provider if needed.  Visit Complete: In person  VideoError- Interactive audio and video telecommunications were attempted between this provider and patient, however failed, due to patient having technical difficulties OR patient did not have access to video capability.  We continued and completed visit with audio only.   Persons Participating in Visit: Patient assisted by family.  AWV Questionnaire: No: Patient Medicare AWV questionnaire was not completed prior to this visit.  Cardiac Risk Factors include: advanced age (>70men, >58 women);diabetes mellitus;dyslipidemia;hypertension     Objective:    Today's Vitals   08/20/23 1517  BP: 120/60  Pulse: 84  Temp: 97.8 F (36.6 C)  TempSrc: Oral  SpO2: 94%  Weight: 182 lb 12.8 oz (82.9 kg)  Height: 5' 1 (1.549 m)   Body mass index is 34.54 kg/m.     08/20/2023    3:34 PM 07/02/2023   12:33 PM 04/10/2022   12:00 PM 04/05/2021   12:15 PM 07/26/2020    3:55 PM 04/21/2020   10:52 AM 06/30/2019    9:53 AM  Advanced Directives  Does Patient Have a Medical Advance Directive? No No Yes Yes Yes Yes Yes  Type of Advance Directive   Living will Healthcare Power of [place];Living will Healthcare Power of [place];Living will  Healthcare Power of [place];Living will  Copy of Healthcare Power of Attorney in Chart?    No - copy requested No - copy requested  No - copy requested  Would patient like information on creating a medical advance directive?  No - Patient declined         Current Medications (verified) Outpatient Encounter Medications as of 08/20/2023  Medication Sig   atorvastatin  (LIPITOR) 80 MG tablet TAKE 1 TABLET BY MOUTH  ONCE  DAILY   cholecalciferol (VITAMIN D3) 25 MCG (1000 UNIT) tablet Take 1,000 Units by mouth daily.   dapagliflozin  propanediol (FARXIGA ) 10 MG TABS tablet Take 1 tablet (10 mg total) by mouth daily before breakfast.   donepezil  (ARICEPT ) 10 MG tablet TAKE 1 TABLET BY MOUTH AT  BEDTIME   ezetimibe  (ZETIA ) 10 MG tablet Take 1 tablet (10 mg total) by mouth daily.   fexofenadine (ALLEGRA) 60 MG tablet Take 60 mg by mouth 2 (two) times daily.   gabapentin  (NEURONTIN ) 100 MG capsule TAKE 1 CAPSULE BY MOUTH 3 TIMES  DAILY AS NEEDED   glucose blood (ONETOUCH ULTRA) test strip USE WITH METER TO CHECK BLOOD SUGAR BEFORE BREAKFAST AND BEFORE DINNER   glucose blood (ONETOUCH ULTRA) test strip USE AS DIRECTED WITH  METER  TO  CHECK  BLOOD  SUGAR  BEFORE  BREAKFAST  AND  DINNER   lidocaine  (LIDODERM ) 5 % Place 1 patch onto the skin daily.   loratadine  (CLARITIN ) 10 MG tablet Take 1 tablet (10 mg total) by mouth daily.   mometasone  (NASONEX ) 50 MCG/ACT nasal spray Place 2 sprays into the nose daily.   olmesartan  (BENICAR ) 20 MG tablet Take 1 tablet (20 mg total) by mouth daily.   Olopatadine  HCl 0.6 % SOLN Place 2 sprays into the nose 2 (two) times daily as needed.   pantoprazole  (PROTONIX ) 40 MG tablet TAKE 1 TABLET BY MOUTH DAILY   vitamin C  (  ASCORBIC ACID ) 500 MG tablet Take 500 mg by mouth daily.   VITAMIN D  PO Take 1 tablet by mouth daily.   acetaminophen  (TYLENOL ) 500 MG tablet Take 1 tablet (500 mg total) by mouth every 6 (six) hours as needed.   albuterol  (VENTOLIN  HFA) 108 (90 Base) MCG/ACT inhaler Inhale 2 puffs into the lungs every 6 (six) hours as needed for wheezing or shortness of breath.   budesonide -glycopyrrolate-formoterol  (BREZTRI  AEROSPHERE) 160-9-4.8 MCG/ACT AERO inhaler Inhale 2 puffs into the lungs daily.   citalopram  (CELEXA ) 10 MG tablet Take 1 tablet by mouth once daily (Patient not taking: Reported on 08/20/2023)   citalopram  (CELEXA ) 20 MG tablet Take 1 tab by mouth every other  day for one week then take 1 tab by mouth every 2 days x 1 week. Then change to sertraline  (Patient not taking: Reported on 08/20/2023)   clopidogrel  (PLAVIX ) 75 MG tablet TAKE 1 TABLET BY MOUTH DAILY (Patient not taking: Reported on 08/20/2023)   desonide (DESOWEN) 0.05 % ointment Apply 1 Application topically 2 (two) times daily. for itchy rash (Patient not taking: Reported on 08/20/2023)   loratadine  (CLARITIN ) 10 MG tablet Take 10 mg by mouth daily. (Patient not taking: Reported on 08/20/2023)   meclizine  (ANTIVERT ) 25 MG tablet Take 1 tablet (25 mg total) by mouth 3 (three) times daily as needed for dizziness.   sertraline  (ZOLOFT ) 25 MG tablet Take 1 tablet (25 mg total) by mouth daily. (Patient not taking: Reported on 08/20/2023)   ULTRAM 50 MG tablet Take 50 mg by mouth in the morning, at noon, and at bedtime. (Patient not taking: Reported on 08/20/2023)   Facility-Administered Encounter Medications as of 08/20/2023  Medication   0.9 %  sodium chloride  infusion    Allergies (verified) Patient has no known allergies.   History: Past Medical History:  Diagnosis Date   Acute left-sided low back pain with left-sided sciatica 05/04/2018   Benign paroxysmal positional vertigo 08/26/2012   Chest pain 09/21/2012   CVA (cerebral infarction) 08/25/2012   Diabetes mellitus without complication (HCC)    Dyslipidemia 08/25/2012   Essential hypertension, benign 08/25/2012   Hyperlipidemia    Hypertension    Meralgia paraesthetica, left 01/12/2018   Stroke [facility])    History reviewed. No pertinent surgical history. Family History  Problem Relation Age of Onset   Kidney failure Mother    Diabetes Mother    Alzheimer's disease Mother    Alzheimer's disease Father    Prostate cancer Father    Diabetes Maternal Grandmother    Diabetes Maternal Grandfather    Alzheimer's disease Paternal Grandmother    Alzheimer's disease Paternal Grandfather    Prostate cancer Paternal Uncle    Social  History   Socioeconomic History   Marital status: Divorced    Spouse name: Not on file   Number of children: 4   Years of education: Not on file   Highest education level: Not on file  Occupational History   Occupation: retired  Tobacco Use   Smoking status: Former    Current packs/day: 0.00    Average packs/day: 0.3 packs/day for 30.0 years (9.0 ttl pk-yrs)    Types: Cigarettes    Start date: 20    Quit date: 2019    Years since quitting: 6.5   Smokeless tobacco: Never  Vaping Use   Vaping status: Never Used  Substance and Sexual Activity   Alcohol use: Not Currently    Comment: occasional   Drug use: No  Sexual activity: Not Currently  Other Topics Concern   Not on file  Social History Narrative   Not on file   Social Drivers of Health   Financial Resource Strain: Low Risk  (08/20/2023)   Overall Financial Resource Strain (CARDIA)    Difficulty of Paying Living Expenses: Not hard at all  Food Insecurity: No Food Insecurity (08/20/2023)   Hunger Vital Sign    Worried About Running Out of Food in the Last Year: Never true    Ran Out of Food in the Last Year: Never true  Recent Concern: Food Insecurity - Food Insecurity Present (06/18/2023)   Hunger Vital Sign    Worried About Running Out of Food in the Last Year: Sometimes true    Ran Out of Food in the Last Year: Sometimes true  Transportation Needs: No Transportation Needs (08/20/2023)   PRAPARE - Administrator, Civil Service (Medical): No    Lack of Transportation (Non-Medical): No  Physical Activity: Inactive (08/20/2023)   Exercise Vital Sign    Days of Exercise per Week: 0 days    Minutes of Exercise per Session: 0 min  Stress: Stress Concern Present (08/20/2023)   [employer] of Occupational Health - Occupational Stress Questionnaire    Feeling of Stress: To some extent  Social Connections: Socially Isolated (08/20/2023)   Social Connection and Isolation Panel    Frequency of Communication  with Friends and Family: More than three times a week    Frequency of Social Gatherings with Friends and Family: Never    Attends Religious Services: Never    Database administrator or Organizations: No    Attends Engineer, structural: Never    Marital Status: Divorced    Tobacco Counseling Counseling given: Not Answered    Clinical Intake:  Pre-visit preparation completed: Yes  Pain : No/denies pain     Nutritional Status: BMI > 30  Obese Nutritional Risks: None Diabetes: Yes CBG done?: No Did pt. bring in CBG monitor from home?: No  Lab Results  Component Value Date   HGBA1C 8.1 (H) 05/05/2023   HGBA1C 7.4 (H) 02/13/2023   HGBA1C 7.0 (H) 10/14/2022     How often do you need to have someone help you when you read instructions, pamphlets, or other written materials from your doctor or pharmacy?: 1 - Never  Interpreter Needed?: No  Information entered by :: NAllen LPN   Activities of Daily Living     08/20/2023    3:21 PM  In your present state of health, do you have any difficulty performing the following activities:  Hearing? 1  Comment has hearing aids  Vision? 0  Difficulty concentrating or making decisions? 1  Walking or climbing stairs? 1  Dressing or bathing? 1  Doing errands, shopping? 1  Preparing Food and eating ? Y  Using the Toilet? N  In the past six months, have you accidently leaked urine? Y  Do you have problems with loss of bowel control? N  Managing your Medications? Y  Managing your Finances? Y  Housekeeping or managing your Housekeeping? Y    Patient Care Team: Georgina Speaks, FNP as PCP - General (General Practice) Georgina Speaks, FNP (General Practice) Rudy Dorothyann DASEN, RPH-CPP (Pharmacist) Morgan Clayborne CROME, RN as [facility] Management Maranda Lister D as Social Worker  I have updated your Care Teams any recent Medical Services you may have received from other providers in the past year.     Assessment:  This is a  routine wellness examination for Donna Gilmore.  Hearing/Vision screen Hearing Screening - Comments:: Has hearing aids Vision Screening - Comments:: Regular eye exams,    Goals Addressed             This Visit's Progress    Patient Stated       08/20/2023, wants to have more energy, wants to go to the gym       Depression Screen     08/20/2023    3:38 PM 07/31/2023   12:40 PM 07/03/2023    4:51 PM 02/13/2023   12:01 PM 10/14/2022   10:17 AM 06/12/2022   10:48 AM 04/10/2022   12:02 PM  PHQ 2/9 Scores  PHQ - 2 Score 6 5 0 6 1 0 0  PHQ- 9 Score 15 5 0 17 9 0     Fall Risk     08/20/2023    3:36 PM 07/31/2023   12:40 PM 07/28/2023    2:30 PM 07/14/2023   12:05 PM 07/03/2023    4:51 PM  Fall Risk   Falls in the past year? 0 0 0 0 0  Number falls in past yr: 0 0 0 0 0  Injury with Fall? 0 0 0 0 0  Risk for fall due to : Medication side effect;Impaired balance/gait Impaired balance/gait;Impaired mobility Impaired balance/gait;Impaired mobility Impaired balance/gait;Impaired mobility;Orthopedic patient No Fall Risks  Follow up Falls evaluation completed;Falls prevention discussed Falls evaluation completed;Education provided;Falls prevention discussed Falls evaluation completed;Follow up appointment Education provided;Falls evaluation completed Falls evaluation completed    MEDICARE RISK AT HOME:  Medicare Risk at Home Any stairs in or around the home?: No If so, are there any without handrails?: No Home free of loose throw rugs in walkways, pet beds, electrical cords, etc?: Yes Adequate lighting in your home to reduce risk of falls?: Yes Life alert?: No Use of a cane, walker or w/c?: No Grab bars in the bathroom?: Yes Shower chair or bench in shower?: Yes Elevated toilet seat or a handicapped toilet?: No  TIMED UP AND GO:  Was the test performed?  Yes  Length of time to ambulate 10 feet: 6 sec Gait slow and steady without use of assistive device  Cognitive Function: Impaired:  Patient has current diagnosis of cognitive impairment.        07/26/2020    3:59 PM 06/30/2019   10:01 AM 06/24/2018    2:33 PM  6CIT Screen  What Year? 0 points 0 points 0 points  What month? 0 points 0 points 0 points  What time? 0 points 0 points 0 points  Count back from 20 0 points 0 points 0 points  Months in reverse 2 points 0 points 0 points  Repeat phrase 4 points 0 points 0 points  Total Score 6 points 0 points 0 points    Immunizations Immunization History  Administered Date(s) Administered   Fluad Quad(high Dose 65+) 10/19/2019, 01/16/2021, 10/04/2021   Fluad Trivalent(High Dose 65+) 10/14/2022   Influenza, High Dose Seasonal PF 11/05/2017   Influenza-Unspecified 10/06/2018   Moderna Sars-Covid-2 Vaccination 02/23/2019, 03/23/2019, 11/23/2019, 09/14/2020   Pfizer Covid-19 Vaccine Bivalent Booster 44yrs & up 04/24/2021   Pneumococcal Conjugate-13 07/07/2018   Pneumococcal Polysaccharide-23 05/16/2020   Tdap 06/25/2018   Zoster Recombinant(Shingrix) 04/24/2021, 08/13/2021    Screening Tests Health Maintenance  Topic Date Due   COVID-19 Vaccine (6 - 2024-25 season) 09/29/2022   MAMMOGRAM  04/22/2023   INFLUENZA VACCINE  08/29/2023   OPHTHALMOLOGY  EXAM  10/09/2023   Diabetic kidney evaluation - Urine ACR  10/14/2023   FOOT EXAM  10/14/2023   HEMOGLOBIN A1C  11/04/2023   Diabetic kidney evaluation - eGFR measurement  07/12/2024   Medicare Annual Wellness (AWV)  08/19/2024   DTaP/Tdap/Td (2 - Td or Tdap) 06/24/2028   Pneumococcal Vaccine: 50+ Years  Completed   DEXA SCAN  Completed   Zoster Vaccines- Shingrix  Completed   Hepatitis B Vaccines  Aged Out   HPV VACCINES  Aged Out   Meningococcal B Vaccine  Aged Out   Hepatitis C Screening  Discontinued    Health Maintenance  Health Maintenance Due  Topic Date Due   COVID-19 Vaccine (6 - 2024-25 season) 09/29/2022   MAMMOGRAM  04/22/2023   Health Maintenance Items Addressed: Mammogram not  needed.  Additional Screening:  Vision Screening: Recommended annual ophthalmology exams for early detection of glaucoma and other disorders of the eye. Would you like a referral to an eye doctor? No    Dental Screening: Recommended annual dental exams for proper oral hygiene  Community Resource Referral / Chronic Care Management: CRR required this visit?  Yes   CCM required this visit?  No   Plan:    I have personally reviewed and noted the following in the patient's chart:   Medical and social history Use of alcohol, tobacco or illicit drugs  Current medications and supplements including opioid prescriptions. Patient is not currently taking opioid prescriptions. Functional ability and status Nutritional status Physical activity Advanced directives List of other physicians Hospitalizations, surgeries, and ER visits in previous 12 months Vitals Screenings to include cognitive, depression, and falls Referrals and appointments  In addition, I have reviewed and discussed with patient certain preventive protocols, quality metrics, and best practice recommendations. A written personalized care plan for preventive services as well as general preventive health recommendations were provided to patient.   Ardella FORBES Dawn, LPN   [id number]   After Visit Summary: (In Person-Printed) AVS printed and given to the patient  Notes: Please refer to Routing Comments.

## 2023-08-20 NOTE — Patient Instructions (Signed)
 Thank you so much for reviewing your medications with me.  Please call me back to discuss your test strips  Here is a list of your medications by therapeutic drug class:  Current Outpatient Medications (Endocrine & Metabolic):    dapagliflozin  propanediol (FARXIGA ) 10 MG TABS tablet, Take 1 tablet (10 mg total) by mouth daily before breakfast.   Current Outpatient Medications (Cardiovascular):    atorvastatin  (LIPITOR) 80 MG tablet, TAKE 1 TABLET BY MOUTH ONCE  DAILY   ezetimibe  (ZETIA ) 10 MG tablet, Take 1 tablet (10 mg total) by mouth daily.   olmesartan -hydrochlorothiazide  (BENICAR  HCT) 40-25 MG tablet, Take 1 tablet by mouth daily.   olmesartan  (BENICAR ) 20 MG tablet, Take 1 tablet (20 mg total) by mouth daily. (Patient not taking: Reported on 08/20/2023)   Current Outpatient Medications (Respiratory):    albuterol  (VENTOLIN  HFA) 108 (90 Base) MCG/ACT inhaler, Inhale 2 puffs into the lungs every 6 (six) hours as needed for wheezing or shortness of breath.   budesonide -glycopyrrolate-formoterol  (BREZTRI  AEROSPHERE) 160-9-4.8 MCG/ACT AERO inhaler, Inhale 2 puffs into the lungs daily.   fexofenadine (ALLEGRA) 60 MG tablet, Take 60 mg by mouth 2 (two) times daily.   mometasone  (NASONEX ) 50 MCG/ACT nasal spray, Place 2 sprays into the nose daily.   Olopatadine  HCl 0.6 % SOLN, Place 2 sprays into the nose 2 (two) times daily as needed.   oxymetazoline  (AFRIN) 0.05 % nasal spray, Place 1 spray into both nostrils 2 (two) times daily.   Current Outpatient Medications (Analgesics):    acetaminophen  (TYLENOL ) 500 MG tablet, Take 1 tablet (500 mg total) by mouth every 6 (six) hours as needed.   ULTRAM 50 MG tablet, Take 50 mg by mouth in the morning, at noon, and at bedtime. (Patient not taking: Reported on 08/20/2023)   Current Outpatient Medications (Hematological):    clopidogrel  (PLAVIX ) 75 MG tablet, TAKE 1 TABLET BY MOUTH DAILY   Current Outpatient Medications (Other):     cholecalciferol (VITAMIN D3) 25 MCG (1000 UNIT) tablet, Take 1,000 Units by mouth daily.   diazepam  (VALIUM ) 5 MG tablet, Take 5 mg by mouth every 8 (eight) hours as needed for anxiety.   donepezil  (ARICEPT ) 10 MG tablet, TAKE 1 TABLET BY MOUTH AT  BEDTIME   gabapentin  (NEURONTIN ) 100 MG capsule, TAKE 1 CAPSULE BY MOUTH 3 TIMES  DAILY AS NEEDED   glucose blood (ONETOUCH ULTRA) test strip, USE WITH METER TO CHECK BLOOD SUGAR BEFORE BREAKFAST AND BEFORE DINNER   glucose blood (ONETOUCH ULTRA) test strip, USE AS DIRECTED WITH  METER  TO  CHECK  BLOOD  SUGAR  BEFORE  BREAKFAST  AND  DINNER   Lidocaine  HCl (ICY HOT MAX LIDOCAINE ) 4 % LIQD, Apply topically. Apply Four times daily as needed for nerve pain   meclizine  (ANTIVERT ) 25 MG tablet, Take 1 tablet (25 mg total) by mouth 3 (three) times daily as needed for dizziness.   pantoprazole  (PROTONIX ) 40 MG tablet, TAKE 1 TABLET BY MOUTH DAILY   vitamin C  (ASCORBIC ACID ) 500 MG tablet, Take 500 mg by mouth daily.   VITAMIN D  PO, Take 1 tablet by mouth daily.   sertraline  (ZOLOFT ) 25 MG tablet, Take 1 tablet (25 mg total) by mouth daily. (Patient not taking: Reported on 08/20/2023)  Current Facility-Administered Medications (Other):    0.9 %  sodium chloride  infusion

## 2023-08-20 NOTE — Progress Notes (Signed)
 08/20/2023  Patient ID: Donna Gilmore, female   DOB: 06-09-1940, 83 y.o.   MRN: [id number]  Patient was in clinic today for her Medicare Annual Wellness Exam.  After her exam, she was seen for medication review.  Donna Gilmore called the office several weeks ago requesting a list of her medications and what they are for.  Her request could not be completed because a full medication review had not been done and because the [employer] version of [facility] will not allow medications to be printed by diagnosis.  Medications CAN be listed by therapeutic class.  Donna Gilmore medications were reviewed in clinic by comparing the list her son uses to fill her pill box, the list in her chart, as well as her actual medication bottles.  Several discrepancies were found:   Olmesartan  20 mg was on her medication list but they have Olmesartan  40 mg/25mg  in their possession. Neither medication has been filled since 11/2022  2.  Patient reported still taking diazepam  5 mg PRN--diazepam  was not on he medication list but (it was added)  3.  Sertraline  25 mg was started on 08/04/23-Patient and son say she is not taking this. Called Pharmacy and the Pharmacist said it was on file ONLY and had never been picked up. (Removed from list)   Updated medication list:  Medications Reviewed Today     Reviewed by Donna Gilmore, [facility] (Pharmacist) on 08/20/23 at 1723  Med List Status: <None>   Medication Order Taking? Sig Documenting Provider Last Dose Status Informant  0.9 %  sodium chloride  infusion [id number]   Abran Norleen SAILOR, MD  Active   acetaminophen  (TYLENOL ) 500 MG tablet [id number] Yes Take 1 tablet (500 mg total) by mouth every 6 (six) hours as needed. Blaise Aleene KIDD, MD  Active   albuterol  (VENTOLIN  HFA) 108 [phone] Base) MCG/ACT inhaler [id number] Yes Inhale 2 puffs into the lungs every 6 (six) hours as needed for wheezing or shortness of breath. Donna Speaks, FNP  Active   atorvastatin  (LIPITOR) 80 MG  tablet [id number] Yes TAKE 1 TABLET BY MOUTH ONCE  DAILY Donna Gilmore, Janece, FNP  Active   budesonide -glycopyrrolate-formoterol  (BREZTRI  AEROSPHERE) 160-9-4.8 MCG/ACT AERO inhaler [id number] Yes Inhale 2 puffs into the lungs daily. Donna Speaks, FNP  Active   cholecalciferol (VITAMIN D3) 25 MCG (1000 UNIT) tablet [id number] Yes Take 1,000 Units by mouth daily. [provider]  Active    Patient not taking:   Discontinued 08/20/23 1638 (Patient Preference)            Med Note>> Donna Gilmore, Donna Gilmore   08/20/2023  4:12 PM Patient did not like side effects     Patient not taking:   Discontinued 08/20/23 1639 (Patient Preference)            Med Note>> Donna Gilmore, [facility]   08/20/2023  4:12 PM Patient did not like side effects    clopidogrel  (PLAVIX ) 75 MG tablet [id number] Yes TAKE 1 TABLET BY MOUTH DAILY Donna Speaks, FNP  Active   dapagliflozin  propanediol (FARXIGA ) 10 MG TABS tablet [id number] Yes Take 1 tablet (10 mg total) by mouth daily before breakfast. Donna Speaks, FNP  Active    Patient not taking:   Discontinued 08/20/23 1615 (Patient Preference)   diazepam  (VALIUM ) 5 MG tablet [id number] Yes Take 5 mg by mouth every 8 (eight) hours as needed for anxiety. [provider]  Active   donepezil  (ARICEPT ) 10 MG tablet [id number] Yes TAKE 1 TABLET BY  MOUTH AT  BEDTIME Donna Speaks, FNP  Active   ezetimibe  (ZETIA ) 10 MG tablet [id number] Yes Take 1 tablet (10 mg total) by mouth daily. Donna Speaks, FNP  Active   fexofenadine (ALLEGRA) 60 MG tablet [id number] Yes Take 60 mg by mouth 2 (two) times daily. [provider]  Active   gabapentin  (NEURONTIN ) 100 MG capsule [id number] Yes TAKE 1 CAPSULE BY MOUTH 3 TIMES  DAILY AS NEEDED Donna Speaks, FNP  Active   glucose blood (ONETOUCH ULTRA) test strip [id number] Yes USE WITH METER TO CHECK BLOOD SUGAR BEFORE BREAKFAST AND BEFORE Donna Gilmore Donna, Speaks, FNP  Active   glucose blood (ONETOUCH ULTRA) test strip [id number] Yes USE AS DIRECTED  WITH  METER  TO  CHECK  BLOOD  SUGAR  BEFORE  BREAKFAST  AND  Donna Gilmore Donna Speaks, FNP  Active    Discontinued 08/20/23 1638 (Cost of medication)   Lidocaine  HCl (ICY HOT MAX LIDOCAINE ) 4 % LIQD [id number] Yes Apply topically. Apply Four times daily as needed for nerve pain [provider]  Active     Discontinued 08/20/23 1643 (Change in therapy)    Patient not taking:   Discontinued 08/20/23 1617 (Change in therapy)   meclizine  (ANTIVERT ) 25 MG tablet [id number] Yes Take 1 tablet (25 mg total) by mouth 3 (three) times daily as needed for dizziness. Sherrine Birmingham, DO  Active   mometasone  (NASONEX ) 50 MCG/ACT nasal spray [id number] Yes Place 2 sprays into the nose daily. Donna Speaks, FNP  Active    Patient not taking:   Discontinued 08/20/23 1706 (Patient Preference)            Med Note>> Donna Cassius PARAS, [facility]   07/18/2023 10:01 AM Taking Olmesartan -HCTZ    olmesartan -hydrochlorothiazide  (BENICAR  HCT) 40-25 MG tablet [id number] Yes Take 1 tablet by mouth daily. [provider]  Active   Olopatadine  HCl 0.6 % SOLN [id number] Yes Place 2 sprays into the nose 2 (two) times daily as needed. Jeneal Danita Macintosh, MD  Active   oxymetazoline  (AFRIN) 0.05 % nasal spray [id number] Yes Place 1 spray into both nostrils 2 (two) times daily as needed. [provider]  Active   pantoprazole  (PROTONIX ) 40 MG tablet [id number] Yes TAKE 1 TABLET BY MOUTH DAILY Petrina Pries, NP  Active    Patient not taking:   Discontinued 08/20/23 1723 (Patient Preference)   ULTRAM 50 MG tablet [id number] Yes Take 50 mg by mouth in the morning, at noon, and at bedtime. [provider]  Active   vitamin C  (ASCORBIC ACID ) 500 MG tablet [id number] Yes Take 500 mg by mouth daily. [provider]  Active Self  VITAMIN D  PO [id number] Yes Take 1 tablet by mouth daily. [provider]  Active            Patient also said they received a letter stating her One Touch test strips  would no longer be covered.  We received the same letter in the clinic.  [employer] will now cover:  Contour Plus Blue, Contour Next Gen , Contuour Next EZ, Contour Next One  New prescriptions were sent in for Accu check guide supplies   Patient's son has a medication list with actual tablets/capsules taped to the page. He will continue to use his list.  Patient is on the True [place] Metric Diabetes Report as her HgA1c from April was 8.1%.  She is taking Farxiga  10 mg to manage blood sugars.  Plan: Follow up in 2 weeks.   Lilias Lorensen  DOROTHA Brought, PharmD, BCACP Clinical Pharmacist [phone]

## 2023-08-20 NOTE — Patient Instructions (Signed)
 [name] , Thank you for taking time out of your busy schedule to complete your Annual Wellness Visit with me. I enjoyed our conversation and look forward to speaking with you again next year. I, as well as your care team,  appreciate your ongoing commitment to your health goals. Please review the following plan we discussed and let me know if I can assist you in the future. Your Game plan/ To Do List    Referrals: If you haven't heard from the office you've been referred to, please reach out to them at the phone provided.  N/a Follow up Visits: Next Medicare AWV with our clinical staff: office will schedule   Have you seen your provider in the last 6 months (3 months if uncontrolled diabetes)? Yes Next Office Visit with your provider: 10/16/2023 at 10:00  Clinician Recommendations:  Aim for 30 minutes of exercise or brisk walking, 6-8 glasses of water, and 5 servings of fruits and vegetables each day.       This is a list of the screening recommended for you and due dates:  Health Maintenance  Topic Date Due   Mammogram  04/22/2023   COVID-19 Vaccine (6 - 2024-25 season) 09/05/2023*   Flu Shot  08/29/2023   Eye exam for diabetics  10/09/2023   Yearly kidney health urinalysis for diabetes  10/14/2023   Complete foot exam   10/14/2023   Hemoglobin A1C  11/04/2023   Yearly kidney function blood test for diabetes  07/12/2024   Medicare Annual Wellness Visit  08/19/2024   DTaP/Tdap/Td vaccine (2 - Td or Tdap) 06/24/2028   Pneumococcal Vaccine for age over 39  Completed   DEXA scan (bone density measurement)  Completed   Zoster (Shingles) Vaccine  Completed   Hepatitis B Vaccine  Aged Out   HPV Vaccine  Aged Out   Meningitis B Vaccine  Aged Out   Hepatitis C Screening  Discontinued  *Topic was postponed. The date shown is not the original due date.    Advanced directives: (Declined) Advance directive discussed with you today. Even though you declined this today, please call our  office should you change your mind, and we can give you the proper paperwork for you to fill out. Advance Care Planning is important because it:  [x]  Makes sure you receive the medical care that is consistent with your values, goals, and preferences  [x]  It provides guidance to your family and loved ones and reduces their decisional burden about whether or not they are making the right decisions based on your wishes.  Follow the link provided in your after visit summary or read over the paperwork we have mailed to you to help you started getting your Advance Directives in place. If you need assistance in completing these, please reach out to us  so that we can help you!  See attachments for Preventive Care and Fall Prevention Tips.

## 2023-08-21 ENCOUNTER — Telehealth: Payer: Self-pay

## 2023-08-21 NOTE — Telephone Encounter (Signed)
 Letter for extra hours faxed to [facility].  [phone] [phone]

## 2023-08-21 NOTE — Progress Notes (Signed)
   Telephone encounter was:  Successful.  Complex Care Management Note Care Guide Note  08/21/2023 Name: Donna Gilmore MRN: [id number] DOB: 07-Nov-1940  Donna Gilmore is a 83 y.o. year old female who is a primary care patient of Georgina Speaks, FNP . The community resource team was consulted for assistance with Utilities  SDOH screenings and interventions completed:  Yes  Social Drivers of Health From This Encounter   Utilities: At Risk (08/21/2023)   Utilities    Threatened with loss of utilities: Yes    SDOH Interventions Today    Flowsheet Row Most Recent Value  SDOH Interventions   Utilities Interventions Community Resources Provided     Care guide performed the following interventions: Patient provided with information about care guide support team and interviewed to confirm resource needs.Pt son requested information for more home care for the pt and utilities assistance. LCSW will be in contact on 7/31 at 1pm. I will be mailing Utility resources to the pt as requested   Follow Up Plan:  No further follow up planned at this time. The patient has been provided with needed resources.  Encounter Outcome:  Patient Visit Completed   Jon Colt [facility]  [facility] Guide, Phone: [phone] Fax: [phone] Website: [email or web address]

## 2023-08-28 ENCOUNTER — Telehealth: Admitting: Licensed Clinical Social Worker

## 2023-08-28 ENCOUNTER — Other Ambulatory Visit: Payer: Self-pay | Admitting: Licensed Clinical Social Worker

## 2023-09-03 ENCOUNTER — Telehealth: Payer: Self-pay | Admitting: Pharmacist

## 2023-09-03 DIAGNOSIS — I129 Hypertensive chronic kidney disease with stage 1 through stage 4 chronic kidney disease, or unspecified chronic kidney disease: Secondary | ICD-10-CM

## 2023-09-03 NOTE — Progress Notes (Signed)
   09/03/2023  Patient ID: Donna Gilmore, female   DOB: Oct 05, 1940, 83 y.o.   MRN: [id number]  Called Patient to follow up on her blood glucose testing supplies. Her son, Jerrell, answered but said the Patient was not at home. He is on her HIPAA so he was asked about her blood glucose testing supplies. Jerrell said they did not get them because they called the Pharmacy and they were told nothing was there.  Per chart review, the test strips were sent and received by [facility] on [address].    The Pharmacy was called. The Pharmacist confirmed the strips had been filled on 08/20/23 and then returned to stock because they were not picked up.  She filled the prescriptions all again today and they all had a $0 copay.  Jerrell was called back and asked to go and pick up the strips ASAP. He communicated understanding.   Upcoming appointment with PCP  10/16/23  Plan: Call Patient back in 1 week.  Cassius DOROTHA Brought, PharmD, BCACP Clinical Pharmacist [phone]

## 2023-09-04 NOTE — Patient Outreach (Signed)
 Complex Care Management   Visit Note  08/28/2023  Name:  Donna Gilmore MRN: [id number] DOB: March 29, 1940  Situation: Referral received for Complex Care Management related to COPD, Diabetes with Complications, and Chronic Kidney Disease I obtained verbal consent from Caregiver Patient.  Visit completed with Caregiver and pt  on the phone  Background:   Past Medical History:  Diagnosis Date   Acute left-sided low back pain with left-sided sciatica 05/04/2018   Benign paroxysmal positional vertigo 08/26/2012   Chest pain 09/21/2012   CVA (cerebral infarction) 08/25/2012   Diabetes mellitus without complication (HCC)    Dyslipidemia 08/25/2012   Essential hypertension, benign 08/25/2012   Hyperlipidemia    Hypertension    Meralgia paraesthetica, left 01/12/2018   Stroke [facility])     Assessment: Patient Reported Symptoms:  Cognitive Cognitive Status: No symptoms reported, Alert and oriented to person, place, and time, Normal speech and language skills Cognitive/Intellectual Conditions Management [RPT]: Other Other: Mild Cognitive Impairment   Health Maintenance Behaviors: Annual physical exam  Neurological Neurological Review of Symptoms: Not assessed    HEENT HEENT Symptoms Reported: Not assessed      Cardiovascular Cardiovascular Symptoms Reported: Not assessed    Respiratory Respiratory Symptoms Reported: Not assesed    Endocrine Endocrine Symptoms Reported: Not assessed    Gastrointestinal Gastrointestinal Symptoms Reported: Not assessed      Genitourinary Genitourinary Symptoms Reported: Not assessed    Integumentary Integumentary Symptoms Reported: Not assessed    Musculoskeletal Musculoskelatal Symptoms Reviewed: Difficulty walking, Limited mobility, Unsteady gait Musculoskeletal Management Strategies: Routine screening      Psychosocial Psychosocial Symptoms Reported: Other Other Psychosocial Conditions: Caregiver Stress Behavioral Management Strategies:  Coping strategies, Support system, Adequate rest Major Change/Loss/Stressor/Fears (CP): Medical condition, self Quality of Family Relationships: involved, supportive, helpful      08/20/2023    3:38 PM  Depression screen PHQ 2/9  Decreased Interest 3  Down, Depressed, Hopeless 3  PHQ - 2 Score 6  Altered sleeping 1  Tired, decreased energy 3  Change in appetite 3  Feeling bad or failure about yourself  1  Trouble concentrating 1  Moving slowly or fidgety/restless 0  Suicidal thoughts 0  PHQ-9 Score 15  Difficult doing work/chores Somewhat difficult    There were no vitals filed for this visit.  Medications Reviewed Today     Reviewed by Ezzard Rolin BIRCH, LCSW (Social Worker) on 08/28/23 at 1315  Med List Status: <None>   Medication Order Taking? Sig Documenting Provider Last Dose Status Informant  0.9 %  sodium chloride  infusion [id number]   Abran Norleen SAILOR, MD  Active   acetaminophen  (TYLENOL ) 500 MG tablet [id number]  Take 1 tablet (500 mg total) by mouth every 6 (six) hours as needed. Blaise Aleene KIDD, MD  Active   albuterol  (VENTOLIN  HFA) 108 [phone] Base) MCG/ACT inhaler [id number]  Inhale 2 puffs into the lungs every 6 (six) hours as needed for wheezing or shortness of breath. Georgina Speaks, FNP  Active   atorvastatin  (LIPITOR) 80 MG tablet [id number]  TAKE 1 TABLET BY MOUTH ONCE  DAILY Georgina Speaks, FNP  Active   Blood Glucose Monitoring Suppl DEVI [id number]  Use to test blood sugar twice daily. (May substitute to any manufacturer covered by patient's insurance.) Accu-Check Guide/Accu-Check guide Me Moore, Janece, FNP  Active   budesonide -glycopyrrolate-formoterol  (BREZTRI  AEROSPHERE) 160-9-4.8 MCG/ACT AERO inhaler [id number]  Inhale 2 puffs into the lungs daily. Georgina Speaks, FNP  Active   cholecalciferol (VITAMIN D3) 25 MCG (1000 UNIT)  tablet [id number]  Take 1,000 Units by mouth daily. [provider]  Active   clopidogrel  (PLAVIX ) 75 MG tablet [id number]  TAKE 1 TABLET BY  MOUTH DAILY Georgina Speaks, FNP  Active   dapagliflozin  propanediol (FARXIGA ) 10 MG TABS tablet [id number]  Take 1 tablet (10 mg total) by mouth daily before breakfast. Georgina Speaks, FNP  Active   diazepam  (VALIUM ) 5 MG tablet [id number]  Take 5 mg by mouth every 8 (eight) hours as needed for anxiety. [provider]  Active   donepezil  (ARICEPT ) 10 MG tablet [id number]  TAKE 1 TABLET BY MOUTH AT  BEDTIME Georgina Speaks, FNP  Active   ezetimibe  (ZETIA ) 10 MG tablet [id number]  Take 1 tablet (10 mg total) by mouth daily. Georgina Speaks, FNP  Active   fexofenadine (ALLEGRA) 60 MG tablet [id number]  Take 60 mg by mouth 2 (two) times daily. [provider]  Active   gabapentin  (NEURONTIN ) 100 MG capsule [id number]  TAKE 1 CAPSULE BY MOUTH 3 TIMES  DAILY AS NEEDED Georgina Speaks, FNP  Active   Glucose Blood (BLOOD GLUCOSE TEST STRIPS) STRP [id number]  Use to test blood sugar twice daily. (May substitute to any manufacturer covered by patient's insurance.) Accu-Check Guide/Accu-Check guide Me Georgina Speaks, FNP  Active   Lancet Device MISC [id number]  Use to test blood sugar twice daily. (May substitute to any manufacturer covered by patient's insurance.) Accu-Check Guide/Accu-Check guide Me Georgina Speaks, FNP  Active   Lancets Misc. MISC [id number]  Use to test blood sugar twice daily. (May substitute to any manufacturer covered by patient's insurance.) Accu-Check Guide/Accu-Check guide Me Georgina Speaks, FNP  Active   Lidocaine  HCl (ICY HOT MAX LIDOCAINE ) 4 % LIQD [id number]  Apply topically. Apply Four times daily as needed for nerve pain [provider]  Active   meclizine  (ANTIVERT ) 25 MG tablet [id number]  Take 1 tablet (25 mg total) by mouth 3 (three) times daily as needed for dizziness. Sherrine Birmingham, DO  Active   mometasone  (NASONEX ) 50 MCG/ACT nasal spray [id number]  Place 2 sprays into the nose daily. Georgina Speaks, FNP  Active   olmesartan -hydrochlorothiazide  (BENICAR  HCT) 40-25 MG  tablet [id number]  Take 1 tablet by mouth daily. [provider]  Active   Olopatadine  HCl 0.6 % SOLN [id number]  Place 2 sprays into the nose 2 (two) times daily as needed. Jeneal Danita Macintosh, MD  Active   oxymetazoline  (AFRIN) 0.05 % nasal spray [id number]  Place 1 spray into both nostrils 2 (two) times daily as needed. [provider]  Active   pantoprazole  (PROTONIX ) 40 MG tablet [id number]  TAKE 1 TABLET BY MOUTH DAILY Petrina Pries, NP  Active   ULTRAM 50 MG tablet [id number]  Take 50 mg by mouth in the morning, at noon, and at bedtime. [provider]  Active   vitamin C  (ASCORBIC ACID ) 500 MG tablet [id number]  Take 500 mg by mouth daily. [provider]  Active Self  VITAMIN D  PO [id number]  Take 1 tablet by mouth daily. [provider]  Active             Recommendation:   Continue Current Plan of Care  Follow Up Plan:   Telephone follow-up in 1 month  Rolin Kerns, LCSW [facility], [facility] Clinical Social Worker Direct Dial: [phone]  Fax: [phone] Website: [email or web address] 8:40 AM

## 2023-09-04 NOTE — Patient Instructions (Signed)
 Visit Information  Thank you for taking time to visit with me today. Please don't hesitate to contact me if I can be of assistance to you before our next scheduled appointment.  Our next appointment is by telephone on 09/02 at 11 AM Please call the care guide team at [phone] if you need to cancel or reschedule your appointment.   Following is a copy of your care plan:   Goals Addressed             This Visit's Progress    LCSW VBCI Social Work Care Plan   On track    Problems:   Level of Care Concerns:Inability to perform ADL's independently Inability to perform IADL's independently  CSW Clinical Goal(s):   Over the next 60 days the Caregiver will work with Child psychotherapist to address concerns related to increasing personal care hours to promote safety and decrease caregiver stress.  Interventions:  Level of Care Concerns in a patient with CKD Stage 3, COPD, and DMII Current level of care: Home with other family or significant other(s): family member: Adult Son Programmer, systems)  Evaluation of patient's unmet needs in current living environment ADL's Assessed needs, level of care concerns, how currently meeting needs and barriers to care Discussed private pay options for personal care needs (Family would prefer not to pay out of pocket) DSS in-home aide program:(Pt receives 2 hrs of personal care services through [employer] is pt's preferred aid) Caregiver would like hours to be increased to four hrs a day M/W/F LCSW informed family that PCP has completed and faxed letter with recommendations to [facility] [employer] [facility] are not interested at this time  Advance Directive LCSW did not assess for Advance Directives  Active listening / Reflection utilized Caregiver stress acknowledged :Caregiver would like increase in hours Consideration on in-home help encouraged : options discussed Emotional Support Provided Problem Solving /Task Center strategies  reviewed  Patient Goals/Self-Care Activities:  Increase coping skills and healthy habits  Follow up with [facility] to confirm receipt of PCP letter  Plan:   Telephone follow up appointment with care management team member scheduled for:  9/02        Please call the Suicide and Crisis Lifeline: 988 go to [facility] [address] [place] [phone]) call 911 if you are experiencing a Mental Health or Behavioral Health Crisis or need someone to talk to.  Patient verbalizes understanding of instructions and care plan provided today and agrees to view in MyChart. Active MyChart status and patient understanding of how to access instructions and care plan via MyChart confirmed with patient.     Rolin Ezzard HUGHS [facility]  [facility], [facility] Clinical Social Worker Direct Dial: [phone]  Fax: [phone] Website: [email or web address] 8:36 AM

## 2023-09-10 ENCOUNTER — Other Ambulatory Visit: Payer: Self-pay | Admitting: Nurse Practitioner

## 2023-09-10 MED ORDER — MECLIZINE HCL 25 MG PO TABS: 25.0000 mg | ORAL_TABLET | Freq: Three times a day (TID) | ORAL | 0 refills | Status: DC | PRN

## 2023-09-10 NOTE — Telephone Encounter (Signed)
 Copied from CRM [id number]. Topic: Clinical - Medication Refill >> Sep 10, 2023 10:29 AM Everette C wrote: Medication: meclizine  (ANTIVERT ) 25 MG tablet [[id number]]  Has the patient contacted their pharmacy? Yes (Agent: If no, request that the patient contact the pharmacy for the refill. If patient does not wish to contact the pharmacy document the reason why and proceed with request.) (Agent: If yes, when and what did the pharmacy advise?)  This is the patient's preferred pharmacy:  [facility] [address] [place], [place] - [address] [place] [address] Phone: [phone] Fax: [phone]  Is this the correct pharmacy for this prescription? Yes If no, delete pharmacy and type the correct one.   Has the prescription been filled recently? Yes  Is the patient out of the medication? Yes  Has the patient been seen for an appointment in the last year OR does the patient have an upcoming appointment? Yes  Can we respond through MyChart? No  Agent: Please be advised that Rx refills may take up to 3 business days. We ask that you follow-up with your pharmacy.

## 2023-09-11 ENCOUNTER — Other Ambulatory Visit: Payer: Self-pay

## 2023-09-11 NOTE — Patient Instructions (Signed)
 Visit Information  Thank you for taking time to visit with me today. Please don't hesitate to contact me if I can be of assistance to you before our next scheduled appointment.  Your next care management appointment is by telephone on Tuesday, September 16 at 12:00 PM  Please call the care guide team at [phone] if you need to cancel, schedule, or reschedule an appointment.   Please call 1-800-273-TALK (toll free, 24 hour hotline) if you are experiencing a Mental Health or Behavioral Health Crisis or need someone to talk to.  Clayborne Ly RN BSN CCM [place]  [facility], [facility] Nurse Care Coordinator  Direct Dial: [phone] Website: [email or web address]

## 2023-09-11 NOTE — Patient Outreach (Addendum)
 Complex Care Management   Visit Note  09/11/2023  Name:  Donna Gilmore MRN: [id number] DOB: [birth year]  Situation: Referral received for Complex Care Management related to CVA, Diabetes with Complications, and Sensorineural hearing loss of both ears, chronic back and shoulder pain, mild cognitive impairment, fatigue, Benign paroxysmal positional vertigo, COPD with Asthma, OSA. I obtained verbal consent from Patient.  Visit completed with patient on the phone.  Background:   Past Medical History:  Diagnosis Date   Acute left-sided low back pain with left-sided sciatica 05/04/2018   Benign paroxysmal positional vertigo 08/26/2012   Chest pain 09/21/2012   CVA (cerebral infarction) 08/25/2012   Diabetes mellitus without complication (HCC)    Dyslipidemia 08/25/2012   Essential hypertension, benign 08/25/2012   Hyperlipidemia    Hypertension    Meralgia paraesthetica, left 01/12/2018   Stroke [facility])     Assessment: Patient Reported Symptoms:  Cognitive Cognitive Status: Alert and oriented to person, place, and time Cognitive/Intellectual Conditions Management [RPT]: Other Other: Mild Cognitive Impairment; hallucinatory nightmares   Health Maintenance Behaviors: Annual physical exam Health Facilitated by: Rest, Healthy diet  Neurological Neurological Review of Symptoms: Dizziness Neurological Management Strategies: Medication therapy, Routine screening Neurological Self-Management Outcome: 2 (bad) Neurological Comment: Discussed patient's request to establish with Neurology, sent in basket message to PCP  HEENT HEENT Symptoms Reported: Change or loss of hearing HEENT Management Strategies: Medical device, Routine screening HEENT Self-Management Outcome: 4 (good)    Cardiovascular Cardiovascular Symptoms Reported: Dizziness Does patient have uncontrolled Hypertension?: No Cardiovascular Management Strategies: Adequate rest, Routine screening, Medication therapy Cardiovascular  Self-Management Outcome: 3 (uncertain)  Respiratory Respiratory Symptoms Reported: Not assesed    Endocrine Endocrine Symptoms Reported: No symptoms reported Is patient diabetic?: Yes Is patient checking blood sugars at home?: No (discussed son Jerrell will take the new glucose meter to the pharmacy for help with initial set up of the meter) Endocrine Self-Management Outcome: 3 (uncertain)  Gastrointestinal Gastrointestinal Symptoms Reported: Not assessed      Genitourinary Genitourinary Symptoms Reported: Not assessed    Integumentary Integumentary Symptoms Reported: Not assessed    Musculoskeletal Musculoskelatal Symptoms Reviewed: Difficulty walking, Limited mobility, Back pain Musculoskeletal Management Strategies: Medical device, Routine screening Musculoskeletal Self-Management Outcome: 4 (good)      Psychosocial Psychosocial Symptoms Reported: Other Other Psychosocial Conditions: Stress related to medical condition Behavioral Management Strategies: Medication therapy, Adequate rest, Support system Behavioral Health Self-Management Outcome: 3 (uncertain) Major Change/Loss/Stressor/Fears (CP): Medical condition, self Techniques to Cope with Loss/Stress/Change: Medication (rest) Quality of Family Relationships: involved, supportive, helpful Do you feel physically threatened by others?: No      08/20/2023    3:38 PM  Depression screen PHQ 2/9  Decreased Interest 3  Down, Depressed, Hopeless 3  PHQ - 2 Score 6  Altered sleeping 1  Tired, decreased energy 3  Change in appetite 3  Feeling bad or failure about yourself  1  Trouble concentrating 1  Moving slowly or fidgety/restless 0  Suicidal thoughts 0  PHQ-9 Score 15  Difficult doing work/chores Somewhat difficult    There were no vitals filed for this visit.  Medications Reviewed Today     Reviewed by Morgan Clayborne CROME, RN (Registered Nurse) on 09/11/23 at 1235  Med List Status: <None>   Medication Order Taking? Sig  Documenting Provider Last Dose Status Informant  0.9 %  sodium chloride  infusion [id number]   Abran Norleen SAILOR, MD  Active   acetaminophen  (TYLENOL ) 500 MG tablet [id number]  Take 1 tablet (500  mg total) by mouth every 6 (six) hours as needed. Blaise Aleene KIDD, MD  Active   albuterol  (VENTOLIN  HFA) 108 [id number] Base) MCG/ACT inhaler [id number]  Inhale 2 puffs into the lungs every 6 (six) hours as needed for wheezing or shortness of breath. Georgina Speaks, FNP  Active   atorvastatin  (LIPITOR) 80 MG tablet [id number]  TAKE 1 TABLET BY MOUTH ONCE  DAILY Georgina Speaks, FNP  Active   Blood Glucose Monitoring Suppl DEVI [id number]  Use to test blood sugar twice daily. (May substitute to any manufacturer covered by patient's insurance.) Accu-Check Guide/Accu-Check guide Me Moore, Janece, FNP  Active   budesonide -glycopyrrolate-formoterol  (BREZTRI  AEROSPHERE) 160-9-4.8 MCG/ACT AERO inhaler [id number]  Inhale 2 puffs into the lungs daily. Georgina Speaks, FNP  Active   cholecalciferol (VITAMIN D3) 25 MCG (1000 UNIT) tablet [id number]  Take 1,000 Units by mouth daily. [provider]  Active   clopidogrel  (PLAVIX ) 75 MG tablet [id number]  TAKE 1 TABLET BY MOUTH DAILY Moore, Janece, FNP  Active   dapagliflozin  propanediol (FARXIGA ) 10 MG TABS tablet [id number]  Take 1 tablet (10 mg total) by mouth daily before breakfast. Georgina Speaks, FNP  Active   diazepam  (VALIUM ) 5 MG tablet [id number]  Take 5 mg by mouth every 8 (eight) hours as needed for anxiety. [provider]  Active   donepezil  (ARICEPT ) 10 MG tablet [id number]  TAKE 1 TABLET BY MOUTH AT  BEDTIME Georgina Speaks, FNP  Active   ezetimibe  (ZETIA ) 10 MG tablet [id number]  Take 1 tablet (10 mg total) by mouth daily. Georgina Speaks, FNP  Active   fexofenadine (ALLEGRA) 60 MG tablet [id number]  Take 60 mg by mouth 2 (two) times daily. [provider]  Active   gabapentin  (NEURONTIN ) 100 MG capsule [id number]  TAKE 1 CAPSULE BY MOUTH 3 TIMES  DAILY AS NEEDED  Georgina Speaks, FNP  Active   Glucose Blood (BLOOD GLUCOSE TEST STRIPS) STRP [id number]  Use to test blood sugar twice daily. (May substitute to any manufacturer covered by patient's insurance.) Accu-Check Guide/Accu-Check guide Me Georgina Speaks, FNP  Active   Lancet Device MISC [id number]  Use to test blood sugar twice daily. (May substitute to any manufacturer covered by patient's insurance.) Accu-Check Guide/Accu-Check guide Me Georgina Speaks, FNP  Active   Lancets Misc. MISC [id number]  Use to test blood sugar twice daily. (May substitute to any manufacturer covered by patient's insurance.) Accu-Check Guide/Accu-Check guide Me Georgina Speaks, FNP  Active   Lidocaine  HCl (ICY HOT MAX LIDOCAINE ) 4 % LIQD [id number]  Apply topically. Apply Four times daily as needed for nerve pain [provider]  Active   meclizine  (ANTIVERT ) 25 MG tablet [id number]  Take 1 tablet (25 mg total) by mouth 3 (three) times daily as needed for dizziness. Georgina Speaks, FNP  Active   mometasone  (NASONEX ) 50 MCG/ACT nasal spray [id number]  Place 2 sprays into the nose daily. Georgina Speaks, FNP  Active   olmesartan -hydrochlorothiazide  (BENICAR  HCT) 40-25 MG tablet [id number]  Take 1 tablet by mouth daily. [provider]  Active   Olopatadine  HCl 0.6 % SOLN [id number]  Place 2 sprays into the nose 2 (two) times daily as needed. Jeneal Danita Macintosh, MD  Active   oxymetazoline  (AFRIN) 0.05 % nasal spray [id number]  Place 1 spray into both nostrils 2 (two) times daily as needed. [provider]  Active   pantoprazole  (PROTONIX ) 40 MG tablet [id number]  TAKE 1 TABLET BY MOUTH DAILY Ohenhen, Pat, NP  Active   ULTRAM  50 MG tablet [id number]  Take 50 mg by mouth in the morning, at noon, and at bedtime. [provider]  Active   vitamin C  (ASCORBIC ACID ) 500 MG tablet [id number]  Take 500 mg by mouth daily. [provider]  Active Self  VITAMIN D  PO [id number]  Take 1 tablet by mouth daily.  [provider]  Active             Recommendation:   PCP Follow-up with [name] FNP on 10/16/23 at 10:00 AM  Follow Up Plan:   Telephone follow up appointment with social worker Rolin Kerns LCSW on September 2 at 11:00 AM Telephone follow up appointment with nurse care manager on Tuesday, September 16 at 12:00 PM  Clayborne Ly RN BSN CCM [facility]  [facility], [facility] Nurse Care Coordinator  Direct Dial: [phone] Website: [email or web address]

## 2023-09-16 ENCOUNTER — Telehealth: Payer: Self-pay | Admitting: Pharmacist

## 2023-09-16 DIAGNOSIS — N183 Chronic kidney disease, stage 3 unspecified: Secondary | ICD-10-CM

## 2023-09-16 NOTE — Progress Notes (Signed)
   09/16/2023  Patient ID: Donna Gilmore, female   DOB: 02-02-1940, 83 y.o.   MRN: [id number]  Called Patient to follow up on blood sugars. Her son, Donna Gilmore answered the phone and did not give the phone to Donna Gilmore. He said they were able to pick up the blood glucose meter sent in to the local pharmacy for them.  When asked how her blood sugars had been, he reported good but did not give any actual numbers. He said the Patient checks her blood sugars herself.  Last HgA1c-8.1% (Since she is 83 years old, her goal A1c should be <8%) Farxiga  on med list but has not been filled since last year   On statin Atorvastatin  80 mg last filled 09/07/23 #100   Olmesartan -HCTZ 40-25- patient's son said she was taking this in July but it has not been filled this year     08/20/2023    3:17 PM 07/15/2023   11:08 AM 07/13/2023   11:36 PM  Vitals with BMI  Height 5' 1 5' 1   Weight 182 lbs 13 oz 181 lbs 10 oz   BMI 34.56 34.33   Systolic 120 130 888  Diastolic 60 72 54  Pulse 84 81 68   B/P at last appointment was 120/60  Upcoming appointment 10/16/2023   Plan: Will send note to PCP prior to the 10/16/23 appointment and follow up with the Patient's labs after the appointment.  Donna Gilmore, PharmD, BCACP Clinical Pharmacist [phone]

## 2023-09-19 ENCOUNTER — Other Ambulatory Visit: Payer: Self-pay | Admitting: Nurse Practitioner

## 2023-09-19 DIAGNOSIS — R202 Paresthesia of skin: Secondary | ICD-10-CM

## 2023-09-23 ENCOUNTER — Ambulatory Visit: Admitting: Nurse Practitioner

## 2023-09-23 ENCOUNTER — Encounter: Payer: Self-pay | Admitting: Nurse Practitioner

## 2023-09-23 VITALS — BP 130/70 | HR 64 | Temp 98.5°F | Ht 61.0 in | Wt 187.4 lb

## 2023-09-23 DIAGNOSIS — R42 Dizziness and giddiness: Secondary | ICD-10-CM | POA: Diagnosis not present

## 2023-09-23 DIAGNOSIS — E1122 Type 2 diabetes mellitus with diabetic chronic kidney disease: Secondary | ICD-10-CM

## 2023-09-23 DIAGNOSIS — R5382 Chronic fatigue, unspecified: Secondary | ICD-10-CM | POA: Diagnosis not present

## 2023-09-23 DIAGNOSIS — I129 Hypertensive chronic kidney disease with stage 1 through stage 4 chronic kidney disease, or unspecified chronic kidney disease: Secondary | ICD-10-CM | POA: Diagnosis not present

## 2023-09-23 DIAGNOSIS — N1832 Chronic kidney disease, stage 3b: Secondary | ICD-10-CM

## 2023-09-23 DIAGNOSIS — N183 Chronic kidney disease, stage 3 unspecified: Secondary | ICD-10-CM

## 2023-09-23 DIAGNOSIS — R351 Nocturia: Secondary | ICD-10-CM

## 2023-09-23 DIAGNOSIS — G4709 Other insomnia: Secondary | ICD-10-CM | POA: Diagnosis not present

## 2023-09-23 DIAGNOSIS — I10 Essential (primary) hypertension: Secondary | ICD-10-CM | POA: Insufficient documentation

## 2023-09-23 MED ORDER — MAGNESIUM GLYCINATE 120 MG PO CAPS: 1.0000 | ORAL_CAPSULE | Freq: Every evening | ORAL | 1 refills | Status: AC

## 2023-09-23 NOTE — Progress Notes (Addendum)
 LILLETTE Kristeen JINNY Gladis, CMA,acting as a neurosurgeon for Donna Ada, FNP.,have documented all relevant documentation on the behalf of Donna Ada, FNP,as directed by  Donna Ada, FNP while in the presence of Donna Ada, FNP.  Subjective:  Patient ID: Donna Gilmore , female    DOB: 1940/06/12 , 83 y.o.   MRN: [id number]  Chief Complaint  Patient presents with   Hypertension    Patient presents today for a high BP readings at home. Patient uses a arm cuff to check BP 2 times daily. Patient states she checks her BP before taking her medications.    Fatigue    Patient reports she is very tired when she wakes up in the morning and it is hard for her to get out of bed.      HPI  Discussed the use of AI scribe software for clinical note transcription with the patient, who gave verbal consent to proceed.  History of Present Illness Donna Gilmore is an 83 year old female with hypertension who presents with elevated blood pressure readings.  She has been experiencing elevated blood pressure readings, with recent measurements reaching as high as 160 mmHg systolic. She takes her blood pressure medication, including Omsartan and hydrochlorothiazide , in the morning before eating and checks her blood pressure before taking her medication. She denies increased salt intake. She is concerned about the accuracy of her blood pressure cuff, as readings have varied significantly.  She is currently on multiple medications, including Farxiga  for diabetes, and takes all her medications consistently without missing doses. However, she is unsure if she has been taking Farxiga  recently as she did not bring her medication list to the visit.  She reports poor sleep quality, experiencing vivid dreams throughout the night, and frequently waking up to use the bathroom three to four times a night. She describes difficulty getting up in the morning due to a full-body ache and a sensation of her head being 'full of water, full  of air.' It takes her until midday to feel capable of doing anything productive.  She has a history of back pain and was referred to physical therapy in May, but she does not recall attending any sessions. She experiences fatigue and has difficulty engaging in physical activities, such as going to the gym or participating in water aerobics, due to her body's limitations.  She mentions a recommendation from a family member to use vinegar in her water as a dietary supplement, but she is unsure of its effects. She has not used it recently.   Past Medical History:  Diagnosis Date   Acute left-sided low back pain with left-sided sciatica 05/04/2018   Benign paroxysmal positional vertigo 08/26/2012   Chest pain 09/21/2012   CVA (cerebral infarction) 08/25/2012   Diabetes mellitus without complication (HCC)    Dyslipidemia 08/25/2012   Essential hypertension, benign 08/25/2012   Hyperlipidemia    Hypertension    Meralgia paraesthetica, left 01/12/2018   Stroke (HCC)      Family History  Problem Relation Age of Onset   Kidney failure Mother    Diabetes Mother    Alzheimer's disease Mother    Alzheimer's disease Father    Prostate cancer Father    Diabetes Maternal Grandmother    Diabetes Maternal Grandfather    Alzheimer's disease Paternal Grandmother    Alzheimer's disease Paternal Grandfather    Prostate cancer Paternal Uncle      Current Outpatient Medications:    acetaminophen  (TYLENOL ) 500 MG tablet, Take  1 tablet (500 mg total) by mouth every 6 (six) hours as needed., Disp: 30 tablet, Rfl: 0   albuterol  (VENTOLIN  HFA) 108 (90 Base) MCG/ACT inhaler, Inhale 2 puffs into the lungs every 6 (six) hours as needed for wheezing or shortness of breath., Disp: 8.5 g, Rfl: 2   atorvastatin  (LIPITOR) 80 MG tablet, TAKE 1 TABLET BY MOUTH ONCE  DAILY, Disp: 100 tablet, Rfl: 2   budesonide -glycopyrrolate-formoterol  (BREZTRI  AEROSPHERE) 160-9-4.8 MCG/ACT AERO inhaler, Inhale 2 puffs into the  lungs daily., Disp: 32.1 g, Rfl: 3   cholecalciferol (VITAMIN D3) 25 MCG (1000 UNIT) tablet, Take 1,000 Units by mouth daily., Disp: , Rfl:    clopidogrel  (PLAVIX ) 75 MG tablet, TAKE 1 TABLET BY MOUTH DAILY, Disp: 100 tablet, Rfl: 2   dapagliflozin  propanediol (FARXIGA ) 10 MG TABS tablet, Take 1 tablet (10 mg total) by mouth daily before breakfast., Disp: , Rfl:    diazepam  (VALIUM ) 5 MG tablet, Take 5 mg by mouth every 8 (eight) hours as needed for anxiety., Disp: , Rfl:    donepezil  (ARICEPT ) 10 MG tablet, TAKE 1 TABLET BY MOUTH AT  BEDTIME, Disp: 100 tablet, Rfl: 2   ezetimibe  (ZETIA ) 10 MG tablet, Take 1 tablet (10 mg total) by mouth daily., Disp: 90 tablet, Rfl: 1   fexofenadine (ALLEGRA) 60 MG tablet, Take 60 mg by mouth 2 (two) times daily., Disp: , Rfl:    gabapentin  (NEURONTIN ) 100 MG capsule, TAKE 1 CAPSULE BY MOUTH 3 TIMES  DAILY AS NEEDED, Disp: 300 capsule, Rfl: 2   Glucose Blood (BLOOD GLUCOSE TEST STRIPS) STRP, Use to test blood sugar twice daily. (May substitute to any manufacturer covered by patient's insurance.) Accu-Check Guide/Accu-Check guide Me, Disp: 100 strip, Rfl: 12   Lancet Device MISC, Use to test blood sugar twice daily. (May substitute to any manufacturer covered by patient's insurance.) Accu-Check Guide/Accu-Check guide Me, Disp: 1 each, Rfl: 0   Lancets Misc. MISC, Use to test blood sugar twice daily. (May substitute to any manufacturer covered by patient's insurance.) Accu-Check Guide/Accu-Check guide Me, Disp: 100 each, Rfl: 12   Lidocaine  HCl (ICY HOT MAX LIDOCAINE ) 4 % LIQD, Apply topically. Apply Four times daily as needed for nerve pain, Disp: , Rfl:    Magnesium  Glycinate 120 MG CAPS, Take 1 tablet by mouth every evening., Disp: 90 capsule, Rfl: 1   meclizine  (ANTIVERT ) 25 MG tablet, Take 1 tablet (25 mg total) by mouth 3 (three) times daily as needed for dizziness., Disp: 30 tablet, Rfl: 0   mometasone  (NASONEX ) 50 MCG/ACT nasal spray, Place 2 sprays into the  nose daily., Disp: 1 each, Rfl: 2   olmesartan -hydrochlorothiazide  (BENICAR  HCT) 40-25 MG tablet, Take 1 tablet by mouth daily., Disp: , Rfl:    Olopatadine  HCl 0.6 % SOLN, Place 2 sprays into the nose 2 (two) times daily as needed., Disp: 30.5 g, Rfl: 5   oxymetazoline  (AFRIN) 0.05 % nasal spray, Place 1 spray into both nostrils 2 (two) times daily as needed., Disp: , Rfl:    pantoprazole  (PROTONIX ) 40 MG tablet, TAKE 1 TABLET BY MOUTH DAILY, Disp: 100 tablet, Rfl: 2   ULTRAM 50 MG tablet, Take 50 mg by mouth in the morning, at noon, and at bedtime., Disp: , Rfl:    vitamin C  (ASCORBIC ACID ) 500 MG tablet, Take 500 mg by mouth daily., Disp: , Rfl:    VITAMIN D  PO, Take 1 tablet by mouth daily., Disp: , Rfl:    Blood Glucose Monitoring Suppl (ACCU-CHEK GUIDE  ME) w/Device KIT, USE TO TEST BLOOD SUGAR TWICE DAILY. (MAY SUBSTITUTE TO ANY MANUFACTURER COVERED BY PATIENT'S INSURANCE)., Disp: 1 kit, Rfl: 0  Current Facility-Administered Medications:    0.9 %  sodium chloride  infusion, 500 mL, Intravenous, Once, Abran Norleen SAILOR, MD   No Known Allergies   Review of Systems  Constitutional: Negative.   HENT:  Negative for rhinorrhea.   Respiratory:  Negative for shortness of breath.   Cardiovascular: Negative.   Neurological: Negative.   Psychiatric/Behavioral: Negative.       Today's Vitals   09/23/23 1625  BP: 130/70  Pulse: 64  Temp: 98.5 F (36.9 C)  TempSrc: Oral  Weight: 187 lb 6.4 oz (85 kg)  Height: 5' 1 (1.549 m)  PainSc: 4   PainLoc: Shoulder   Body mass index is 35.41 kg/m.  Wt Readings from Last 3 Encounters:  09/23/23 187 lb 6.4 oz (85 kg)  08/20/23 182 lb 12.8 oz (82.9 kg)  07/15/23 181 lb 9.6 oz (82.4 kg)      Objective:  Physical Exam Vitals and nursing note reviewed.  Constitutional:      General: She is not in acute distress.    Appearance: Normal appearance. She is obese.  HENT:     Ears:     Comments: Hard of hearing Cardiovascular:     Rate and  Rhythm: Normal rate and regular rhythm.     Pulses: Normal pulses.     Heart sounds: Normal heart sounds. No murmur heard. Pulmonary:     Effort: Pulmonary effort is normal. No respiratory distress.     Breath sounds: Normal breath sounds. No wheezing.  Musculoskeletal:        General: Normal range of motion.  Skin:    General: Skin is warm and dry.     Capillary Refill: Capillary refill takes less than 2 seconds.  Neurological:     General: No focal deficit present.     Mental Status: She is alert and oriented to person, place, and time.     Cranial Nerves: No cranial nerve deficit.     Motor: No weakness.  Psychiatric:        Mood and Affect: Mood normal.        Behavior: Behavior normal.        Thought Content: Thought content normal.        Judgment: Judgment normal.         Assessment And Plan:  Dizziness Assessment & Plan: Dizziness possibly related to vivid dreams and sleep disturbances. - Refer to neurologist for further evaluation and management.  Orders: -     Ambulatory referral to Neurology  Chronic fatigue Assessment & Plan: Significant fatigue and weakness, possibly linked to sleep disturbances and nocturia. - Encourage use of recumbent bike to improve strength and energy. - Follow up on physical therapy referral.   Obesity, morbid (HCC) Assessment & Plan: She is encouraged to strive for BMI less than 30 to decrease cardiac risk. Advised to aim for at least 150 minutes of exercise per week.    Type 2 DM with CKD stage 3 and hypertension (HCC) Assessment & Plan: Blood pressure elevated at 160 mmHg. Concerns about cuff accuracy and timing of readings. - Instructed to take medication first, then check blood pressure an hour later. - Ordered new blood pressure cuff for home delivery. - Advised to change battery in current cuff. - Monitor blood pressure with new cuff once received. - Consider magnesium  glycinate supplementation. - Re-evaluate  blood  pressure at next physical on September 18th. - Educated that her blood sugar should remain below 150 mg/dL. Farxiga  aids in blood pressure control. - Monitor blood sugar levels and report if exceeding 150 mg/dL.  Orders: -     Magnesium  Glycinate; Take 1 tablet by mouth every evening.  Dispense: 90 capsule; Refill: 1  Other insomnia Assessment & Plan: She is having vivid dreams may contribute to sleep disturbance, possibly linked to depression or anxiety. - Refer to neurologist for second opinion on dizziness and vivid dreams. - Consider referral to behavioral health specialist for sleep disturbances and mood disorders.   Nocturia Assessment & Plan: Frequent nighttime urination may worsen sleep disturbances. Late fluid intake noted. - Advise to stop drinking fluids by 8 PM. - Allow small sips of water if needed during the night.      Return for keep same next.  Patient was given opportunity to ask questions. Patient verbalized understanding of the plan and was able to repeat key elements of the plan. All questions were answered to their satisfaction.    LILLETTE Donna Ada, FNP, have reviewed all documentation for this visit. The documentation on 09/23/23 for the exam, diagnosis, procedures, and orders are all accurate and complete.   IF YOU HAVE BEEN REFERRED TO A SPECIALIST, IT MAY TAKE 1-2 WEEKS TO SCHEDULE/PROCESS THE REFERRAL. IF YOU HAVE NOT HEARD FROM US /SPECIALIST IN TWO WEEKS, PLEASE GIVE US  A CALL AT [phone].

## 2023-09-23 NOTE — Patient Instructions (Addendum)
 Your blood pressure should be less than 130/80, check your blood pressure 1 hour after taking your blood pressure medications I have added magnesium  glycinate 1 capsule in the evening with meals.  Your blood sugar should be 70-120 however if over 150 send message to the office via mychart

## 2023-09-23 NOTE — Assessment & Plan Note (Signed)
 Significant fatigue and weakness, possibly linked to sleep disturbances and nocturia. - Encourage use of recumbent bike to improve strength and energy. - Follow up on physical therapy referral.

## 2023-09-24 ENCOUNTER — Telehealth: Payer: Self-pay

## 2023-09-24 NOTE — Telephone Encounter (Signed)
 Patient was identified as falling into the [place] Measure - Diabetes.   Patient was: Appointment scheduled with primary care provider in the next 30 days.

## 2023-09-29 ENCOUNTER — Other Ambulatory Visit: Payer: Self-pay | Admitting: Nurse Practitioner

## 2023-09-29 DIAGNOSIS — I129 Hypertensive chronic kidney disease with stage 1 through stage 4 chronic kidney disease, or unspecified chronic kidney disease: Secondary | ICD-10-CM

## 2023-09-30 ENCOUNTER — Other Ambulatory Visit: Payer: Self-pay | Admitting: Licensed Clinical Social Worker

## 2023-09-30 NOTE — Patient Outreach (Signed)
 Complex Care Management   Visit Note  09/30/2023  Name:  Donna Gilmore MRN: [id number] DOB: 24-Aug-1940  Situation: Referral received for Complex Care Management related to Mental/Behavioral Health diagnosis Insomnia, Anxiety, Depression I obtained verbal consent from Caregiver.  Visit completed with Caregiver  on the phone  Background:   Past Medical History:  Diagnosis Date   Acute left-sided low back pain with left-sided sciatica 05/04/2018   Benign paroxysmal positional vertigo 08/26/2012   Chest pain 09/21/2012   CVA (cerebral infarction) 08/25/2012   Diabetes mellitus without complication (HCC)    Dyslipidemia 08/25/2012   Essential hypertension, benign 08/25/2012   Hyperlipidemia    Hypertension    Meralgia paraesthetica, left 01/12/2018   Stroke [facility])     Assessment: Patient Reported Symptoms:  Cognitive Cognitive Status: No symptoms reported Cognitive/Intellectual Conditions Management [RPT]: Other Other: Mild Cognitive Impairment; Vivid dreams   Health Maintenance Behaviors: Annual physical exam  Neurological Neurological Review of Symptoms: Dizziness Neurological Management Strategies: Medication therapy, Routine screening Neurological Comment: Referral completed to Neurology by PCP on 09/23/23  HEENT HEENT Symptoms Reported: Not assessed      Cardiovascular Cardiovascular Symptoms Reported: Dizziness Cardiovascular Management Strategies: Medication therapy, Routine screening  Respiratory Respiratory Symptoms Reported: No symptoms reported    Endocrine Endocrine Symptoms Reported: No symptoms reported Is patient diabetic?: Yes    Gastrointestinal Gastrointestinal Symptoms Reported: Not assessed      Genitourinary Genitourinary Symptoms Reported: Not assessed    Integumentary Integumentary Symptoms Reported: Not assessed    Musculoskeletal Musculoskelatal Symptoms Reviewed: Difficulty walking, Limited mobility, Back pain   Falls in the past year?:  No Number of falls in past year: 1 or less Was there an injury with Fall?: No Fall Risk Category Calculator: 0 Patient Fall Risk Level: Low Fall Risk Fall risk Follow up: Falls prevention discussed  Psychosocial Psychosocial Symptoms Reported: No symptoms reported          09/30/2023    PHQ2-9 Depression Screening   Little interest or pleasure in doing things    Feeling down, depressed, or hopeless    PHQ-2 - Total Score    Trouble falling or staying asleep, or sleeping too much    Feeling tired or having little energy    Poor appetite or overeating     Feeling bad about yourself - or that you are a failure or have let yourself or your family down    Trouble concentrating on things, such as reading the newspaper or watching television    Moving or speaking so slowly that other people could have noticed.  Or the opposite - being so fidgety or restless that you have been moving around a lot more than usual    Thoughts that you would be better off dead, or hurting yourself in some way    PHQ2-9 Total Score    If you checked off any problems, how difficult have these problems made it for you to do your work, take care of things at home, or get along with other people    Depression Interventions/Treatment      There were no vitals filed for this visit.  Medications Reviewed Today     Reviewed by Ezzard Rolin BIRCH, LCSW (Social Worker) on 09/30/23 at 1224  Med List Status: <None>   Medication Order Taking? Sig Documenting Provider Last Dose Status Informant  0.9 %  sodium chloride  infusion [id number]   Abran Norleen SAILOR, MD  Active   acetaminophen  (TYLENOL ) 500 MG tablet [id number]  Take 1 tablet (500 mg total) by mouth every 6 (six) hours as needed. Blaise Aleene KIDD, MD  Active   albuterol  (VENTOLIN  HFA) 108 [phone] Base) MCG/ACT inhaler [id number]  Inhale 2 puffs into the lungs every 6 (six) hours as needed for wheezing or shortness of breath. Georgina Speaks, FNP  Active   atorvastatin  (LIPITOR)  80 MG tablet [id number]  TAKE 1 TABLET BY MOUTH ONCE  DAILY Georgina Speaks, FNP  Active   Blood Glucose Monitoring Suppl (ACCU-CHEK GUIDE ME) w/Device KIT [id number]  USE TO TEST BLOOD SUGAR TWICE DAILY. (MAY SUBSTITUTE TO ANY MANUFACTURER COVERED BY PATIENT'S INSURANCE). Georgina Speaks, FNP  Active   budesonide -glycopyrrolate-formoterol  (BREZTRI  AEROSPHERE) 160-9-4.8 MCG/ACT AERO inhaler [id number]  Inhale 2 puffs into the lungs daily. Georgina Speaks, FNP  Active   cholecalciferol (VITAMIN D3) 25 MCG (1000 UNIT) tablet [id number]  Take 1,000 Units by mouth daily. [provider]  Active   clopidogrel  (PLAVIX ) 75 MG tablet [id number]  TAKE 1 TABLET BY MOUTH DAILY Moore, Janece, FNP  Active   dapagliflozin  propanediol (FARXIGA ) 10 MG TABS tablet [id number]  Take 1 tablet (10 mg total) by mouth daily before breakfast. Georgina Speaks, FNP  Active   diazepam  (VALIUM ) 5 MG tablet [id number]  Take 5 mg by mouth every 8 (eight) hours as needed for anxiety. [provider]  Active   donepezil  (ARICEPT ) 10 MG tablet [id number]  TAKE 1 TABLET BY MOUTH AT  BEDTIME Georgina Speaks, FNP  Active   ezetimibe  (ZETIA ) 10 MG tablet [id number]  Take 1 tablet (10 mg total) by mouth daily. Georgina Speaks, FNP  Active   fexofenadine (ALLEGRA) 60 MG tablet [id number]  Take 60 mg by mouth 2 (two) times daily. [provider]  Active   gabapentin  (NEURONTIN ) 100 MG capsule [id number]  TAKE 1 CAPSULE BY MOUTH 3 TIMES  DAILY AS NEEDED Georgina Speaks, FNP  Active   Glucose Blood (BLOOD GLUCOSE TEST STRIPS) STRP [id number]  Use to test blood sugar twice daily. (May substitute to any manufacturer covered by patient's insurance.) Accu-Check Guide/Accu-Check guide Me Georgina Speaks, FNP  Active   Lancet Device MISC [id number]  Use to test blood sugar twice daily. (May substitute to any manufacturer covered by patient's insurance.) Accu-Check Guide/Accu-Check guide Me Georgina Speaks, FNP  Active   Lancets Misc. MISC [id number]  Use  to test blood sugar twice daily. (May substitute to any manufacturer covered by patient's insurance.) Accu-Check Guide/Accu-Check guide Me Georgina Speaks, FNP  Active   Lidocaine  HCl (ICY HOT MAX LIDOCAINE ) 4 % LIQD [id number]  Apply topically. Apply Four times daily as needed for nerve pain [provider]  Active   Magnesium  Glycinate 120 MG CAPS [id number]  Take 1 tablet by mouth every evening. Georgina Speaks, FNP  Active   meclizine  (ANTIVERT ) 25 MG tablet [id number]  Take 1 tablet (25 mg total) by mouth 3 (three) times daily as needed for dizziness. Georgina Speaks, FNP  Active   mometasone  (NASONEX ) 50 MCG/ACT nasal spray [id number]  Place 2 sprays into the nose daily. Georgina Speaks, FNP  Active   olmesartan -hydrochlorothiazide  (BENICAR  HCT) 40-25 MG tablet [id number]  Take 1 tablet by mouth daily. [provider]  Active   Olopatadine  HCl 0.6 % SOLN [id number]  Place 2 sprays into the nose 2 (two) times daily as needed. Jeneal Danita Macintosh, MD  Active   oxymetazoline  (AFRIN) 0.05 % nasal spray [id number]  Place 1 spray into both nostrils 2 (two) times daily as needed. [provider]  Active   pantoprazole  (PROTONIX ) 40 MG tablet [id number]  TAKE 1 TABLET BY MOUTH DAILY Petrina Pries, NP  Active   ULTRAM 50 MG tablet [id number]  Take 50 mg by mouth in the morning, at noon, and at bedtime. [provider]  Active   vitamin C  (ASCORBIC ACID ) 500 MG tablet [id number]  Take 500 mg by mouth daily. [provider]  Active Self  VITAMIN D  PO [id number]  Take 1 tablet by mouth daily. [provider]  Active             Recommendation:   PCP Follow-up DME requests:  other Blood Pressure Cuff Continue Current Plan of Care  Follow Up Plan:   Telephone follow-up in 1 month  Rolin Kerns, LCSW [facility]  [facility], [facility] Clinical Social Worker Direct Dial: [phone]  Fax: [phone] Website:  [email or web address] 12:33 PM

## 2023-09-30 NOTE — Patient Instructions (Signed)
 Visit Information  Thank you for taking time to visit with me today. Please don't hesitate to contact me if I can be of assistance to you before our next scheduled appointment.  Your next care management appointment is by telephone on 10/07 at 11 AM  Please call the care guide team at [phone] if you need to cancel, schedule, or reschedule an appointment.   Please call the Suicide and Crisis Lifeline: 988 go to [facility] Urgent [facility] [address], [place] [phone]) call 911 if you are experiencing a Mental Health or Behavioral Health Crisis or need someone to talk to.  Rolin Kerns, LCSW [place]  [facility], [facility] Clinical Social Worker Direct Dial: [phone]  Fax: [phone] Website: [email or web address] 12:33 PM

## 2023-10-02 ENCOUNTER — Encounter: Payer: Self-pay | Admitting: Internal Medicine

## 2023-10-05 DIAGNOSIS — R351 Nocturia: Secondary | ICD-10-CM | POA: Insufficient documentation

## 2023-10-05 NOTE — Assessment & Plan Note (Signed)
 Blood pressure elevated at 160 mmHg. Concerns about cuff accuracy and timing of readings. - Instructed to take medication first, then check blood pressure an hour later. - Ordered new blood pressure cuff for home delivery. - Advised to change battery in current cuff. - Monitor blood pressure with new cuff once received. - Consider magnesium  glycinate supplementation. - Re-evaluate blood pressure at next physical on September 18th. - Educated that her blood sugar should remain below 150 mg/dL. Farxiga  aids in blood pressure control. - Monitor blood sugar levels and report if exceeding 150 mg/dL.

## 2023-10-05 NOTE — Assessment & Plan Note (Signed)
 She is having vivid dreams may contribute to sleep disturbance, possibly linked to depression or anxiety. - Refer to neurologist for second opinion on dizziness and vivid dreams. - Consider referral to behavioral health specialist for sleep disturbances and mood disorders.

## 2023-10-05 NOTE — Assessment & Plan Note (Signed)
 Dizziness possibly related to vivid dreams and sleep disturbances. - Refer to neurologist for further evaluation and management.

## 2023-10-05 NOTE — Assessment & Plan Note (Signed)
 Frequent nighttime urination may worsen sleep disturbances. Late fluid intake noted. - Advise to stop drinking fluids by 8 PM. - Allow small sips of water if needed during the night.

## 2023-10-10 NOTE — Assessment & Plan Note (Signed)
 She is encouraged to strive for BMI less than 30 to decrease cardiac risk. Advised to aim for at least 150 minutes of exercise per week.

## 2023-10-14 ENCOUNTER — Telehealth: Payer: Self-pay

## 2023-10-15 ENCOUNTER — Encounter: Payer: Self-pay | Admitting: Nurse Practitioner

## 2023-10-15 ENCOUNTER — Ambulatory Visit: Payer: Self-pay

## 2023-10-15 ENCOUNTER — Telehealth: Payer: Self-pay | Admitting: Pharmacy Technician

## 2023-10-15 DIAGNOSIS — E119 Type 2 diabetes mellitus without complications: Secondary | ICD-10-CM | POA: Diagnosis not present

## 2023-10-15 DIAGNOSIS — H47322 Drusen of optic disc, left eye: Secondary | ICD-10-CM | POA: Diagnosis not present

## 2023-10-15 DIAGNOSIS — H25811 Combined forms of age-related cataract, right eye: Secondary | ICD-10-CM | POA: Diagnosis not present

## 2023-10-15 DIAGNOSIS — Z961 Presence of intraocular lens: Secondary | ICD-10-CM | POA: Diagnosis not present

## 2023-10-15 DIAGNOSIS — H35373 Puckering of macula, bilateral: Secondary | ICD-10-CM | POA: Diagnosis not present

## 2023-10-15 DIAGNOSIS — H10413 Chronic giant papillary conjunctivitis, bilateral: Secondary | ICD-10-CM | POA: Diagnosis not present

## 2023-10-15 NOTE — Progress Notes (Signed)
 10/15/2023 Name: Donna Gilmore MRN: [id number] DOB: [birth year]  Patient is appearing on a report for [employer] [place] [employer] Diabetes and last engaged with the clinical pharmacist to discuss diabetes on 09/16/2023. Contacted patient today to discuss diabetes management and completed medication review.   Diabetes Plan from last clinical pharmacist appointment:  Plan: Will send note to PCP prior to the 10/16/23 appointment and follow up with the Patient's labs after the appointment.     Medication Adherence Barriers Identified:  Patient made recommended medication changes per plan: Yes Spoke to patient's son and he informs she takes Farxiga . However, he was unsure if she takes it once or twice a day but informs he knows she takes it at least once per day. The order is for Farxiga  10mg  every day and patient was informed of this information. It appears that patient receives this thru [facility]. Patient's son also informs she takes Olmesartan /hydrochlorothiazide  40/25mg  daily. Access issues with any new medication or testing device: No There is no fill history data for Farixga or Olmesartan /hydrochlorothiazide  40/25.Patient is receiving Farxiga  from [facility]. Patient is checking blood sugars as prescribed: Yes Patient's son Donna Gilmore informs she checks blood sugar twice a day via finger stick checks with new meter and provided the following blood sugar readings:                   10/05/2023 AM reading was 125 and PM reading was 176                   9/082025 AM reading was 129 and PM reading was 148                   10/07/2023 AM reading was 122 and PM reading was 151        10/08/2023 AM reading was 143 and PM reading was 143        10/09/2023 AM reading was 125 and PM reading was 109        10/10/2023 AM reading was 137 and PM reading was 170                   10/11/2023 AM reading was 109 and PM reading was 147                   10/12/2023 AM reading was 109 and PM reading was 121                    10/13/2023 AM reading was 136 and PM reading was 127                   10/14/2023 AM reading was 133 and PM reading was 141 Patient may need a new prescription sent into [facility] for Farxiga  as it was last signed in October to [facility]. Patient is check blood pressure twice a day per patient's son Donna Gilmore. He informs patient received new blood pressure cuff. Patient's son reports the following blood pressure readings:                    10/05/2023 AM reading was 138/88 and PM reading was 132/79                    10/06/2023 AM reading was 124/71 and PM reading was 130/95                    10/07/2023 AM reading was 132/86 and  PM reading was 119/72                    10/08/2023 AM reading was 108/85 and PM reading was 111/74                    9/112025  AM reading was 128/75 and PM reading was 136/82                    10/10/2023 AM reading was 122/80 and PM reading was 127/71                    10/11/2023 AM reading was 127/71 and PM reading was 135/75                    10/12/2023 AM reading was 133/83 and PM reading was 135/82                    10/13/2023 AM reading was 145/73 and PM reading was 107/75                    10/14/2023 AM reading was 113/67 and PM reading was 132/75    Medication Adherence Barriers Addressed/Actions Taken:  Reviewed medication changes per plan from last clinical pharmacist note Medication Access for Farxiga  may need to be sent into [facility] for a refill. Will discuss medication access concerns with pharmacist Educated patient to contact pharmacy regarding new prescriptions/refills. Patient Assistance  for Farxiga  and Breztri  Patient Assistance Program approved Patient may need new prescription sent into [facility] for Farxiga . Last form signed in October 2024. Reviewed instructions for monitoring blood sugars at home and reminded patient to keep a written log to review with pharmacist Reminded patient of date/time of upcoming clinical pharmacist follow up and any upcoming  PCP/specialists visits. Patient denies transportation barriers to the appointment. Yes  Next clinical pharmacist appointment is scheduled for: TBD  Kate Caddy, CPhT [facility] Office: [phone] Email: [email or web address]

## 2023-10-15 NOTE — Telephone Encounter (Signed)
 FYI Only or Action Required?: FYI only for provider.  Patient was last seen in primary care on 09/23/2023 by Georgina Speaks, FNP.  Called Nurse Triage reporting Dizziness and Fatigue.  Symptoms began several months ago.  Interventions attempted: Nothing.  Symptoms are: gradually worsening.  Triage Disposition: See Physician Within 24 Hours  Patient/caregiver understands and will follow disposition?: Yes   Copied from CRM #[id number]. Topic: Clinical - Red Word Triage >> Oct 15, 2023 10:46 AM Willma SAUNDERS wrote: [facility] that prompted transfer to Nurse Triage: Patient has been lightheaded, dizzy and had fatigue over the last week. Reason for Disposition  [1] MODERATE dizziness (e.g., interferes with normal activities) AND [2] has NOT been evaluated by doctor (or NP/PA) for this  (Exception: Dizziness caused by heat exposure, sudden standing, or poor fluid intake.)  Answer Assessment - Initial Assessment Questions 1. DESCRIPTION: Describe your dizziness.     Only when she wakes up in the morning, improves as the day progresses  4. SEVERITY: How bad is it?  Do you feel like you are going to faint? Can you stand and walk?     Able to walk with walker, but does no feel like passing out 5. ONSET:  When did the dizziness begin?     6- 8 months, worsening over the last week 6. AGGRAVATING FACTORS: Does anything make it worse? (e.g., standing, change in head position)     Getting out of bed 7. HEART RATE: Can you tell me your heart rate? How many beats in 15 seconds?  (Note: Not all patients can do this.)       Not assessed 8. CAUSE: What do you think is causing the dizziness? (e.g., decreased fluids or food, diarrhea, emotional distress, heat exposure, new medicine, sudden standing, vomiting; unknown)     unknown 9. RECURRENT SYMPTOM: Have you had dizziness before? If Yes, ask: When was the last time? What happened that time?     Yes, has history of vertigo 10. OTHER  SYMPTOMS: Do you have any other symptoms? (e.g., fever, chest pain, vomiting, diarrhea, bleeding)       States that patient went to the bathroom a lot last week, but does not know why 11. PREGNANCY: Is there any chance you are pregnant? When was your last menstrual period?       N/a  Protocols used: Dizziness - Lightheadedness-A-AH

## 2023-10-15 NOTE — Telephone Encounter (Signed)
 This encounter was created in error - please disregard.

## 2023-10-16 ENCOUNTER — Telehealth: Payer: Self-pay

## 2023-10-16 ENCOUNTER — Ambulatory Visit: Attending: Internal Medicine

## 2023-10-16 ENCOUNTER — Encounter: Payer: Medicare Other | Admitting: Nurse Practitioner

## 2023-10-16 ENCOUNTER — Encounter: Payer: Self-pay | Admitting: Internal Medicine

## 2023-10-16 ENCOUNTER — Ambulatory Visit (INDEPENDENT_AMBULATORY_CARE_PROVIDER_SITE_OTHER): Admitting: Internal Medicine

## 2023-10-16 VITALS — BP 100/70 | HR 75 | Temp 98.4°F | Ht 61.0 in | Wt 181.6 lb

## 2023-10-16 DIAGNOSIS — R5382 Chronic fatigue, unspecified: Secondary | ICD-10-CM

## 2023-10-16 DIAGNOSIS — N183 Chronic kidney disease, stage 3 unspecified: Secondary | ICD-10-CM | POA: Diagnosis not present

## 2023-10-16 DIAGNOSIS — E1122 Type 2 diabetes mellitus with diabetic chronic kidney disease: Secondary | ICD-10-CM

## 2023-10-16 DIAGNOSIS — R42 Dizziness and giddiness: Secondary | ICD-10-CM

## 2023-10-16 DIAGNOSIS — J449 Chronic obstructive pulmonary disease, unspecified: Secondary | ICD-10-CM

## 2023-10-16 DIAGNOSIS — I129 Hypertensive chronic kidney disease with stage 1 through stage 4 chronic kidney disease, or unspecified chronic kidney disease: Secondary | ICD-10-CM | POA: Diagnosis not present

## 2023-10-16 DIAGNOSIS — F322 Major depressive disorder, single episode, severe without psychotic features: Secondary | ICD-10-CM | POA: Insufficient documentation

## 2023-10-16 NOTE — Telephone Encounter (Signed)
 Call placed to [facility] to inquire about Farxiga  prescription.  [facility] rep says she needs a new prescription.  Can we send in a one electronically to [facility]?

## 2023-10-16 NOTE — Progress Notes (Unsigned)
 EP to read.

## 2023-10-16 NOTE — Progress Notes (Signed)
 I,Donna Gilmore, CMA,acting as a Neurosurgeon for Donna LOISE Slocumb, MD.,have documented all relevant documentation on the behalf of Donna LOISE Slocumb, MD,as directed by  Donna LOISE Slocumb, MD while in the presence of Donna LOISE Slocumb, MD.  Subjective:  Patient ID: Donna Gilmore , female    DOB: 1940-07-11 , 83 y.o.   MRN: [id number]  Chief Complaint  Patient presents with   Dizziness    Patient presents today for dizziness & fatigue. Accompanied by her son. This has been an ongoing issue she feels is getting worse. She does take the Meclizine . She only takes once nightly. She wants to know if she should take it more than once nightly.  She has not completed mammogram.    HPI Discussed the use of AI scribe software for clinical note transcription with the patient, who gave verbal consent to proceed.  History of Present Illness Donna Gilmore is an 83 year old female with a history of stroke who presents with morning dizziness.  She experiences dizziness primarily in the mornings upon waking, lasting approximately two and a half hours before improving. She describes her head as feeling 'full of air' with the sensation radiating down her back. No palpitations, but she feels faint. This dizziness is a longstanding issue, attributed to a past stroke.  She takes multiple medications, including olmesartan  for blood pressure and Farxiga  for diabetes. Olmesartan  is taken in the morning and Farxiga  at lunch. She also takes atorvastatin , gabapentin , and donepezil , with gabapentin  being used for neuropathy pain related to diabetes. She takes eight pills in the morning and three at night, experiencing difficulty getting her body moving until around noon.  She attempts to drink 64 ounces of water daily, as she was told a couple of years ago that she needs to do so. She also experiences itching in her ears and eyes, and she uses hearing aids.   Past Medical History:  Diagnosis Date   Acute left-sided low  back pain with left-sided sciatica 05/04/2018   Benign paroxysmal positional vertigo 08/26/2012   Chest pain 09/21/2012   CVA (cerebral infarction) 08/25/2012   Diabetes mellitus without complication (HCC)    Dyslipidemia 08/25/2012   Essential hypertension, benign 08/25/2012   Hyperlipidemia    Hypertension    Meralgia paraesthetica, left 01/12/2018   Stroke (HCC)      Family History  Problem Relation Age of Onset   Kidney failure Mother    Diabetes Mother    Alzheimer's disease Mother    Alzheimer's disease Father    Prostate cancer Father    Diabetes Maternal Grandmother    Diabetes Maternal Grandfather    Alzheimer's disease Paternal Grandmother    Alzheimer's disease Paternal Grandfather    Prostate cancer Paternal Uncle      Current Outpatient Medications:    acetaminophen  (TYLENOL ) 500 MG tablet, Take 1 tablet (500 mg total) by mouth every 6 (six) hours as needed., Disp: 30 tablet, Rfl: 0   albuterol  (VENTOLIN  HFA) 108 (90 Base) MCG/ACT inhaler, Inhale 2 puffs into the lungs every 6 (six) hours as needed for wheezing or shortness of breath., Disp: 8.5 g, Rfl: 2   atorvastatin  (LIPITOR) 80 MG tablet, TAKE 1 TABLET BY MOUTH ONCE  DAILY, Disp: 100 tablet, Rfl: 2   Blood Glucose Monitoring Suppl (ACCU-CHEK GUIDE ME) w/Device KIT, USE TO TEST BLOOD SUGAR TWICE DAILY. (MAY SUBSTITUTE TO ANY MANUFACTURER COVERED BY PATIENT'S INSURANCE)., Disp: 1 kit, Rfl: 0   budesonide -glycopyrrolate-formoterol  (BREZTRI  AEROSPHERE) 160-9-4.8 MCG/ACT AERO  inhaler, Inhale 2 puffs into the lungs daily., Disp: 32.1 g, Rfl: 3   cholecalciferol (VITAMIN D3) 25 MCG (1000 UNIT) tablet, Take 1,000 Units by mouth daily., Disp: , Rfl:    clopidogrel  (PLAVIX ) 75 MG tablet, TAKE 1 TABLET BY MOUTH DAILY, Disp: 100 tablet, Rfl: 2   dapagliflozin  propanediol (FARXIGA ) 10 MG TABS tablet, Take 1 tablet (10 mg total) by mouth daily before breakfast., Disp: 90 tablet, Rfl: 2   diazepam  (VALIUM ) 5 MG tablet, Take 5  mg by mouth every 8 (eight) hours as needed for anxiety., Disp: , Rfl:    donepezil  (ARICEPT ) 10 MG tablet, TAKE 1 TABLET BY MOUTH AT  BEDTIME, Disp: 100 tablet, Rfl: 2   ezetimibe  (ZETIA ) 10 MG tablet, Take 1 tablet (10 mg total) by mouth daily., Disp: 90 tablet, Rfl: 1   fexofenadine (ALLEGRA) 60 MG tablet, Take 60 mg by mouth 2 (two) times daily., Disp: , Rfl:    gabapentin  (NEURONTIN ) 100 MG capsule, TAKE 1 CAPSULE BY MOUTH 3 TIMES  DAILY AS NEEDED, Disp: 300 capsule, Rfl: 2   Glucose Blood (BLOOD GLUCOSE TEST STRIPS) STRP, Use to test blood sugar twice daily. (May substitute to any manufacturer covered by patient's insurance.) Accu-Check Guide/Accu-Check guide Me, Disp: 100 strip, Rfl: 12   Lancet Device MISC, Use to test blood sugar twice daily. (May substitute to any manufacturer covered by patient's insurance.) Accu-Check Guide/Accu-Check guide Me, Disp: 1 each, Rfl: 0   Lancets Misc. MISC, Use to test blood sugar twice daily. (May substitute to any manufacturer covered by patient's insurance.) Accu-Check Guide/Accu-Check guide Me, Disp: 100 each, Rfl: 12   Lidocaine  HCl (ICY HOT MAX LIDOCAINE ) 4 % LIQD, Apply topically. Apply Four times daily as needed for nerve pain, Disp: , Rfl:    Magnesium  Glycinate 120 MG CAPS, Take 1 tablet by mouth every evening., Disp: 90 capsule, Rfl: 1   meclizine  (ANTIVERT ) 25 MG tablet, Take 1 tablet (25 mg total) by mouth 3 (three) times daily as needed for dizziness., Disp: 30 tablet, Rfl: 0   mometasone  (NASONEX ) 50 MCG/ACT nasal spray, Place 2 sprays into the nose daily., Disp: 1 each, Rfl: 2   olmesartan -hydrochlorothiazide  (BENICAR  HCT) 40-25 MG tablet, Take 1 tablet by mouth daily., Disp: , Rfl:    Olopatadine  HCl 0.6 % SOLN, Place 2 sprays into the nose 2 (two) times daily as needed., Disp: 30.5 g, Rfl: 5   oxymetazoline  (AFRIN) 0.05 % nasal spray, Place 1 spray into both nostrils 2 (two) times daily as needed., Disp: , Rfl:    pantoprazole  (PROTONIX ) 40  MG tablet, TAKE 1 TABLET BY MOUTH DAILY, Disp: 100 tablet, Rfl: 2   ULTRAM 50 MG tablet, Take 50 mg by mouth in the morning, at noon, and at bedtime., Disp: , Rfl:    vitamin C  (ASCORBIC ACID ) 500 MG tablet, Take 500 mg by mouth daily., Disp: , Rfl:    VITAMIN D  PO, Take 1 tablet by mouth daily., Disp: , Rfl:   Current Facility-Administered Medications:    0.9 %  sodium chloride  infusion, 500 mL, Intravenous, Once, Abran Norleen SAILOR, MD   No Known Allergies   Review of Systems  Constitutional: Negative.   Respiratory: Negative.    Cardiovascular: Negative.   Gastrointestinal: Negative.   Neurological:  Positive for dizziness.  Psychiatric/Behavioral: Negative.       Today's Vitals   10/16/23 1516 10/16/23 1523  BP: 100/80 100/70  Pulse: 60 75  Temp: 98.4 F (36.9 C)  SpO2: 98%   Weight: 181 lb 9.6 oz (82.4 kg)   Height: 5' 1 (1.549 m)    Body mass index is 34.31 kg/m.  Wt Readings from Last 3 Encounters:  10/16/23 181 lb 9.6 oz (82.4 kg)  09/23/23 187 lb 6.4 oz (85 kg)  08/20/23 182 lb 12.8 oz (82.9 kg)     Objective:  Physical Exam Vitals and nursing note reviewed.  Constitutional:      Appearance: Normal appearance.  HENT:     Head: Normocephalic and atraumatic.  Eyes:     Extraocular Movements: Extraocular movements intact.  Cardiovascular:     Rate and Rhythm: Normal rate and regular rhythm.     Heart sounds: Normal heart sounds.  Pulmonary:     Effort: Pulmonary effort is normal.     Breath sounds: Normal breath sounds.  Musculoskeletal:     Cervical back: Normal range of motion.  Skin:    General: Skin is warm.  Neurological:     General: No focal deficit present.     Mental Status: She is alert.  Psychiatric:        Mood and Affect: Mood normal.        Behavior: Behavior normal.         Assessment And Plan:  Dizziness Assessment & Plan: Dizziness likely due to orthostatic hypotension and possible dehydration from olmesartan  HCT and Farxiga .  Stroke may contribute. Low nocturnal heart rate suspected. - Order 14-day heart monitor to assess nocturnal heart rate. - Schedule neurologist appointment for dizziness evaluation. - Advise olmesartan  upon awakening, Farxiga  at 10 AM. - Ensure 64 ounces of water daily.  Orders: -     LONG TERM MONITOR (3-14 DAYS); Future  Type 2 DM with CKD stage 3 and hypertension (HCC) Assessment & Plan: Type 2 diabetes mellitus with diabetic neuropathy and chronic kidney disease Type 2 diabetes managed with Farxiga . Diabetic neuropathy causes foot pain. - Take Farxiga  at 10 AM with other morning medications.  Orders: -     Microalbumin / creatinine urine ratio  Medication management Multiple medications adjusted for timing to reduce morning pill burden and manage symptoms. - Take olmesartan  upon awakening. - Take Farxiga  at 10 AM with other morning medications. - Take atorvastatin  and donepezil  at night. - Take gabapentin  at night for neuropathic pain. - Avoid Farxiga  at night to prevent nocturia and falls.  Return in 8 days (on 10/24/2023), or [place]- apply Zio monitor.  Patient was given opportunity to ask questions. Patient verbalized understanding of the plan and was able to repeat key elements of the plan. All questions were answered to their satisfaction.   I, Donna LOISE Slocumb, MD, have reviewed all documentation for this visit. The documentation on 10/16/23 for the exam, diagnosis, procedures, and orders are all accurate and complete.   IF YOU HAVE BEEN REFERRED TO A SPECIALIST, IT MAY TAKE 1-2 WEEKS TO SCHEDULE/PROCESS THE REFERRAL. IF YOU HAVE NOT HEARD FROM US /SPECIALIST IN TWO WEEKS, PLEASE GIVE US  A CALL AT [phone] X [phone].   THE PATIENT IS ENCOURAGED TO PRACTICE SOCIAL DISTANCING DUE TO THE COVID-19 PANDEMIC.

## 2023-10-16 NOTE — Patient Instructions (Signed)
 Dizziness Dizziness is a common problem. It makes you feel unsteady or light-headed. You may feel like you're about to faint. Dizziness can lead to getting hurt if you stumble or fall. It's more common to feel dizzy if you're an older adult. Many things can cause you to feel dizzy. These include: Medicines. Dehydration. This is when there's not enough water in your body. Illness. Follow these instructions at home: Eating and drinking  Drink enough fluid to keep your pee (urine) pale yellow. This helps keep you from getting dehydrated. Try to drink more clear fluids, such as water. Do not drink alcohol. Try to limit how much caffeine you take in. Try to limit how much salt, also called sodium, you take in. Activity Try not to make quick movements. Stand up slowly from sitting in a chair. Steady yourself until you feel okay. In the morning, first sit up on the side of the bed. When you feel okay, hold onto something and slowly stand up. Do this until you know that your balance is okay. If you need to stand in one place for a long time, move your legs often. Tighten and relax the muscles in your legs while you're standing. Do not drive or use machines if you feel dizzy. Avoid bending down if you feel dizzy. Place items in your home so you can reach them without leaning over. Lifestyle Do not smoke, vape, or use products with nicotine or tobacco in them. If you need help quitting, talk with your health care provider. Try to lower your stress level. You can do this by using methods like yoga or meditation. Talk with your provider if you need help. General instructions Watch your dizziness for any changes. Take your medicines only as told by your provider. Talk with your provider if you think you're dizzy because of a medicine you're taking. Tell a friend or a family member that you're feeling dizzy. If they spot any changes in your behavior, have them call your provider. Contact a health care  provider if: Your dizziness doesn't go away, or you have new symptoms. Your dizziness gets worse. You feel like you may vomit. You have trouble hearing. You have a fever. You have neck pain or a stiff neck. You fall or get hurt. Get help right away if: You vomit each time you eat or drink. You have watery poop and can't eat or drink. You have trouble talking, walking, swallowing, or using your arms, hands, or legs. You feel very weak. You're bleeding. You're not thinking clearly, or you have trouble forming sentences. A friend or family member may spot this. Your vision changes, or you get a very bad headache. These symptoms may be an emergency. Call 911 right away. Do not wait to see if the symptoms will go away. Do not drive yourself to the hospital. This information is not intended to replace advice given to you by your health care provider. Make sure you discuss any questions you have with your health care provider. Document Revised: 10/17/2022 Document Reviewed: 02/28/2022 Elsevier Patient Education  2024 [employer].

## 2023-10-17 ENCOUNTER — Other Ambulatory Visit: Payer: Self-pay | Admitting: Nurse Practitioner

## 2023-10-17 ENCOUNTER — Telehealth: Payer: Self-pay | Admitting: Pharmacist

## 2023-10-17 DIAGNOSIS — N183 Chronic kidney disease, stage 3 unspecified: Secondary | ICD-10-CM

## 2023-10-17 LAB — MICROALBUMIN / CREATININE URINE RATIO
Creatinine, Urine: 130.1 mg/dL
Microalb/Creat Ratio: 3 mg/g{creat} (ref 0–29)
Microalbumin, Urine: 4.5 ug/mL

## 2023-10-17 MED ORDER — DAPAGLIFLOZIN PROPANEDIOL 10 MG PO TABS: 10.0000 mg | ORAL_TABLET | Freq: Every day | ORAL | 2 refills | Status: AC

## 2023-10-17 NOTE — Telephone Encounter (Signed)
 Copied from CRM [id number]. Topic: Clinical - Medication Refill >> Oct 17, 2023 11:35 AM Myrick T wrote: Medication: Glucose Blood (BLOOD GLUCOSE TEST STRIPS) STRP  Has the patient contacted their pharmacy? No  This is the patient's preferred pharmacy:  [facility] [address] [place], [place] - [address] [place] [address] Phone: [phone] Fax: [phone]  Is this the correct pharmacy for this prescription? Yes  Has the prescription been filled recently? Yes  Is the patient out of the medication? Yes  Has the patient been seen for an appointment in the last year OR does the patient have an upcoming appointment? Yes  Can we respond through MyChart? Yes  Agent: Please be advised that Rx refills may take up to 3 business days. We ask that you follow-up with your pharmacy.

## 2023-10-17 NOTE — Progress Notes (Signed)
   10/17/2023  Patient ID: Donna Gilmore, female   DOB: 1940/04/24, 83 y.o.   MRN: [id number]  Received request from [facility] requesting a new prescription for Farxiga  for the Patient.   Lab Results  Component Value Date   HGBA1C 8.1 (H) 05/05/2023   HGBA1C 7.4 (H) 02/13/2023   HGBA1C 7.0 (H) 10/14/2022   On statin therapy: atorvastatin  80 mg last filled 09/07/2023 [facility] on med list but no fill history:  Olmesartan /HCTZ 40/25 (noted previously)  New prescription sent for Farxiga  to [facility]  Next appointment: 12/09/2023   Follow up after appointment for renewal   Cassius DOROTHA Brought, PharmD, BCACP Clinical Pharmacist [phone]

## 2023-10-19 NOTE — Assessment & Plan Note (Signed)
 Type 2 diabetes mellitus with diabetic neuropathy and chronic kidney disease Type 2 diabetes managed with Farxiga . Diabetic neuropathy causes foot pain. - Take Farxiga  at 10 AM with other morning medications.

## 2023-10-19 NOTE — Assessment & Plan Note (Signed)
 Dizziness likely due to orthostatic hypotension and possible dehydration from olmesartan  HCT and Farxiga . Stroke may contribute. Low nocturnal heart rate suspected. - Order 14-day heart monitor to assess nocturnal heart rate. - Schedule neurologist appointment for dizziness evaluation. - Advise olmesartan  upon awakening, Farxiga  at 10 AM. - Ensure 64 ounces of water daily.

## 2023-10-20 ENCOUNTER — Other Ambulatory Visit: Payer: Self-pay | Admitting: Nurse Practitioner

## 2023-10-20 ENCOUNTER — Ambulatory Visit: Payer: Self-pay | Admitting: Internal Medicine

## 2023-10-20 MED ORDER — BLOOD GLUCOSE TEST VI STRP: ORAL_STRIP | 12 refills | Status: DC

## 2023-10-20 NOTE — Telephone Encounter (Unsigned)
 Copied from CRM #[id number]. Topic: Clinical - Medication Refill >> Oct 20, 2023  9:52 AM Carlatta H wrote: Medication: meclizine  (ANTIVERT ) 25 MG tablet  Has the patient contacted their pharmacy? No (Agent: If no, request that the patient contact the pharmacy for the refill. If patient does not wish to contact the pharmacy document the reason why and proceed with request.) (Agent: If yes, when and what did the pharmacy advise?)  This is the patient's preferred pharmacy:  [facility] [address] [place], [place] - [address] [place] [address] Phone: [phone] Fax: [phone]     Is this the correct pharmacy for this prescription? Yes If no, delete pharmacy and type the correct one.   Has the prescription been filled recently? No  Is the patient out of the medication? Yes  Has the patient been seen for an appointment in the last year OR does the patient have an upcoming appointment? Yes  Can we respond through MyChart? Yes  Agent: Please be advised that Rx refills may take up to 3 business days. We ask that you follow-up with your pharmacy.

## 2023-10-21 MED ORDER — MECLIZINE HCL 25 MG PO TABS: 25.0000 mg | ORAL_TABLET | Freq: Three times a day (TID) | ORAL | 0 refills | Status: DC | PRN

## 2023-10-23 ENCOUNTER — Encounter: Payer: Self-pay | Admitting: Neurology

## 2023-10-24 ENCOUNTER — Ambulatory Visit

## 2023-10-24 VITALS — BP 122/70 | Temp 98.3°F | Ht 61.0 in

## 2023-10-24 DIAGNOSIS — R42 Dizziness and giddiness: Secondary | ICD-10-CM

## 2023-10-24 NOTE — Progress Notes (Unsigned)
 Patient is in office today for a nurse visit to apply Zio monitor.

## 2023-10-29 ENCOUNTER — Telehealth: Payer: Self-pay

## 2023-11-04 ENCOUNTER — Telehealth: Admitting: Licensed Clinical Social Worker

## 2023-11-04 ENCOUNTER — Other Ambulatory Visit: Payer: Self-pay | Admitting: Nurse Practitioner

## 2023-11-04 MED ORDER — MECLIZINE HCL 25 MG PO TABS: 25.0000 mg | ORAL_TABLET | Freq: Three times a day (TID) | ORAL | 0 refills | Status: DC | PRN

## 2023-11-04 NOTE — Telephone Encounter (Signed)
 Copied from CRM #[id number]. Topic: Clinical - Medication Refill >> Nov 04, 2023 12:07 PM Joesph B wrote: Medication:  meclizine  (ANTIVERT ) 25 MG tablet , patient is requesting for more than 30 tablets. She does not want to go to the pharmacy every 2 weeks.    Has the patient contacted their pharmacy? Yes (Agent: If no, request that the patient contact the pharmacy for the refill. If patient does not wish to contact the pharmacy document the reason why and proceed with request.) (Agent: If yes, when and what did the pharmacy advise?)  This is the patient's preferred pharmacy:  [facility] [address] [place], [place] - [address] [place] [address] Phone: [phone] Fax: [phone]    Is this the correct pharmacy for this prescription? Yes If no, delete pharmacy and type the correct one.   Has the prescription been filled recently? Yes  Is the patient out of the medication? No, enough to last till Friday   Has the patient been seen for an appointment in the last year OR does the patient have an upcoming appointment? Yes  Can we respond through MyChart? Yes  Agent: Please be advised that Rx refills may take up to 3 business days. We ask that you follow-up with your pharmacy.

## 2023-11-04 NOTE — Telephone Encounter (Signed)
 Copied from CRM #[id number]. Topic: Clinical - Medication Refill >> Nov 04, 2023 12:07 PM Joesph B wrote: Medication:  meclizine  (ANTIVERT ) 25 MG tablet , patient is requesting for more than 30 tablets. She does not want to go to the pharmacy every 2 weeks.

## 2023-11-11 ENCOUNTER — Other Ambulatory Visit: Payer: Self-pay | Admitting: Nurse Practitioner

## 2023-11-11 DIAGNOSIS — R42 Dizziness and giddiness: Secondary | ICD-10-CM | POA: Diagnosis not present

## 2023-11-12 ENCOUNTER — Other Ambulatory Visit: Payer: Self-pay | Admitting: Nurse Practitioner

## 2023-11-12 NOTE — Telephone Encounter (Unsigned)
 Copied from CRM [id number]. Topic: Clinical - Medication Refill >> Nov 12, 2023 11:48 AM Willma R wrote: Medication: meclizine  (ANTIVERT ) 25 MG tablet (Is asking for for more than a 10day supply)  Has the patient contacted their pharmacy? Yes, call dr  This is the patient's preferred pharmacy:  [facility] [address] [place], [place] - [address] [place] [address] Phone: [phone] Fax: [phone]  Is this the correct pharmacy for this prescription? Yes If no, delete pharmacy and type the correct one.   Has the prescription been filled recently? No  Is the patient out of the medication? No  Has the patient been seen for an appointment in the last year OR does the patient have an upcoming appointment? Yes  Can we respond through MyChart? Yes  Agent: Please be advised that Rx refills may take up to 3 business days. We ask that you follow-up with your pharmacy.

## 2023-11-13 ENCOUNTER — Other Ambulatory Visit: Payer: Self-pay

## 2023-11-13 MED ORDER — MECLIZINE HCL 25 MG PO TABS: 25.0000 mg | ORAL_TABLET | Freq: Three times a day (TID) | ORAL | 1 refills | Status: AC | PRN

## 2023-11-13 MED ORDER — MECLIZINE HCL 25 MG PO TABS: 25.0000 mg | ORAL_TABLET | Freq: Three times a day (TID) | ORAL | 0 refills | Status: DC | PRN

## 2023-11-13 NOTE — Patient Outreach (Addendum)
 Complex Care Management   Visit Note  11/13/2023  Name:  Donna Gilmore MRN: [id number] DOB: 11-Jan-1941  Situation: Referral received for Complex Care Management related to CVA, Diabetes with Complications, and Sensorineural hearing loss of both ears, chronic back and shoulder pain, mild cognitive impairment, fatigue, Benign paroxysmal positional vertigo, COPD with Asthma, OSA. I obtained verbal consent from Patient.  Visit completed with Patient on the phone.  Background:   Past Medical History:  Diagnosis Date   Acute left-sided low back pain with left-sided sciatica 05/04/2018   Benign paroxysmal positional vertigo 08/26/2012   Chest pain 09/21/2012   CVA (cerebral infarction) 08/25/2012   Diabetes mellitus without complication (HCC)    Dyslipidemia 08/25/2012   Essential hypertension, benign 08/25/2012   Hyperlipidemia    Hypertension    Meralgia paraesthetica, left 01/12/2018   Stroke [facility])     Assessment: Patient Reported Symptoms:  Cognitive Cognitive Status: Alert and oriented to person, place, and time, Normal speech and language skills Cognitive/Intellectual Conditions Management [RPT]: None reported or documented in medical history or problem list   Health Maintenance Behaviors: Annual physical exam, Healthy diet, Immunizations, Sleep adequate Health Facilitated by: Rest, Healthy diet, Pain control  Neurological Neurological Review of Symptoms: Dizziness Neurological Management Strategies: Routine screening, Medication therapy, Adequate rest Neurological Self-Management Outcome: 3 (uncertain) Neurological Comment: Patient has increased her Meclizine  to twice daily with good effectiveness  HEENT HEENT Symptoms Reported: Change or loss of hearing, Nasal discharge HEENT Management Strategies: Routine screening, Medical device HEENT Self-Management Outcome: 3 (uncertain)    Cardiovascular Cardiovascular Symptoms Reported: Dizziness Does patient have uncontrolled  Hypertension?: No Cardiovascular Management Strategies: Adequate rest, Medication therapy, Routine screening, Fluid modification Cardiovascular Self-Management Outcome: 3 (uncertain) Cardiovascular Comment: Patient has increased her Meclizine  to twice daily with good effectiveness  Respiratory Respiratory Symptoms Reported: No symptoms reported Respiratory Management Strategies: Routine screening, Medication therapy Respiratory Self-Management Outcome: 4 (good)  Endocrine Endocrine Symptoms Reported: No symptoms reported Is patient diabetic?: Yes Is patient checking blood sugars at home?: Yes List most recent blood sugar readings, include date and time of day: FBS today 116 Endocrine Self-Management Outcome: 4 (good)  Gastrointestinal Gastrointestinal Symptoms Reported: Not assessed      Genitourinary Genitourinary Symptoms Reported: Not assessed    Integumentary Integumentary Symptoms Reported: Rash Additional Integumentary Details: patient developed a skin rash after applying the Zio patch, the rash cleared up after removing the patch Skin Management Strategies: Routine screening Skin Self-Management Outcome: 4 (good)  Musculoskeletal Musculoskelatal Symptoms Reviewed: Difficulty walking, Limited mobility, Unsteady gait Additional Musculoskeletal Details: patient uses a Rollator for ambulation Musculoskeletal Management Strategies: Medical device, Routine screening, Adequate rest Musculoskeletal Self-Management Outcome: 4 (good) Falls in the past year?: No Number of falls in past year: 1 or less Was there an injury with Fall?: No Fall Risk Category Calculator: 0 Patient Fall Risk Level: Low Fall Risk Patient at Risk for Falls Due to: Impaired balance/gait, Impaired mobility Fall risk Follow up: Education provided, Falls evaluation completed  Psychosocial Psychosocial Symptoms Reported: Other Other Psychosocial Conditions: stress related to chronic conditions Behavioral Management  Strategies: Adequate rest, Support system, Medication therapy, Counseling, Coping strategies Behavioral Health Self-Management Outcome: 4 (good) Major Change/Loss/Stressor/Fears (CP): Medical condition, self Techniques to Cope with Loss/Stress/Change: Diversional activities, Medication, Counseling Quality of Family Relationships: helpful, involved, supportive Do you feel physically threatened by others?: No    11/13/2023    PHQ2-9 Depression Screening   Marsia Cino interest or pleasure in doing things    Feeling down,  depressed, or hopeless    PHQ-2 - Total Score    Trouble falling or staying asleep, or sleeping too much    Feeling tired or having Harce Volden energy    Poor appetite or overeating     Feeling bad about yourself - or that you are a failure or have let yourself or your family down    Trouble concentrating on things, such as reading the newspaper or watching television    Moving or speaking so slowly that other people could have noticed.  Or the opposite - being so fidgety or restless that you have been moving around a lot more than usual    Thoughts that you would be better off dead, or hurting yourself in some way    PHQ2-9 Total Score    If you checked off any problems, how difficult have these problems made it for you to do your work, take care of things at home, or get along with other people    Depression Interventions/Treatment      Vitals:   11/13/23 1230  BP: (!) 142/79  Pulse: (!) 53    Medications Reviewed Today     Reviewed by Morgan Clayborne CROME, RN (Registered Nurse) on 11/13/23 at 1224  Med List Status: <None>   Medication Order Taking? Sig Documenting Provider Last Dose Status Informant  0.9 %  sodium chloride  infusion [id number]   Abran Norleen SAILOR, MD  Active   acetaminophen  (TYLENOL ) 500 MG tablet [id number]  Take 1 tablet (500 mg total) by mouth every 6 (six) hours as needed. Blaise Aleene KIDD, MD  Active   albuterol  (VENTOLIN  HFA) 108 [phone] Base) MCG/ACT inhaler  [id number]  Inhale 2 puffs into the lungs every 6 (six) hours as needed for wheezing or shortness of breath. Georgina Speaks, FNP  Active   atorvastatin  (LIPITOR) 80 MG tablet [id number]  TAKE 1 TABLET BY MOUTH ONCE  DAILY Georgina Speaks, FNP  Active   Blood Glucose Monitoring Suppl (ACCU-CHEK GUIDE ME) w/Device KIT [id number]  USE TO TEST BLOOD SUGAR TWICE DAILY. (MAY SUBSTITUTE TO ANY MANUFACTURER COVERED BY PATIENT'S INSURANCE). Georgina Speaks, FNP  Active   budesonide -glycopyrrolate-formoterol  (BREZTRI  AEROSPHERE) 160-9-4.8 MCG/ACT AERO inhaler [id number]  Inhale 2 puffs into the lungs daily. Georgina Speaks, FNP  Active   cholecalciferol (VITAMIN D3) 25 MCG (1000 UNIT) tablet [id number]  Take 1,000 Units by mouth daily. [provider]  Active   clopidogrel  (PLAVIX ) 75 MG tablet [id number]  TAKE 1 TABLET BY MOUTH DAILY Moore, Janece, FNP  Active   dapagliflozin  propanediol (FARXIGA ) 10 MG TABS tablet [id number]  Take 1 tablet (10 mg total) by mouth daily before breakfast. Georgina Speaks, FNP  Active            Med Note MACK, CASSIUS PARAS   Fri Oct 17, 2023  2:00 PM) GETS THROUGH PATIENT ASSISTANCE  diazepam  (VALIUM ) 5 MG tablet [id number]  Take 5 mg by mouth every 8 (eight) hours as needed for anxiety. [provider]  Active   donepezil  (ARICEPT ) 10 MG tablet [id number]  TAKE 1 TABLET BY MOUTH AT  BEDTIME Georgina Speaks, FNP  Active   ezetimibe  (ZETIA ) 10 MG tablet [id number]  TAKE 1 TABLET BY MOUTH DAILY Moore, Janece, FNP  Active   fexofenadine (ALLEGRA) 60 MG tablet [id number]  Take 60 mg by mouth 2 (two) times daily. [provider]  Active   gabapentin  (NEURONTIN ) 100 MG capsule [id number]  TAKE 1 CAPSULE BY MOUTH 3 TIMES  DAILY AS NEEDED  Georgina Speaks, FNP  Active   Glucose Blood (BLOOD GLUCOSE TEST STRIPS) STRP [id number]  Use to test blood sugar twice daily. (May substitute to any manufacturer covered by patient's insurance.) Accu-Check Guide/Accu-Check guide Me Georgina Speaks, FNP   Active   Lancet Device MISC [id number]  Use to test blood sugar twice daily. (May substitute to any manufacturer covered by patient's insurance.) Accu-Check Guide/Accu-Check guide Me Georgina Speaks, FNP  Active   Lancets Misc. MISC [id number]  Use to test blood sugar twice daily. (May substitute to any manufacturer covered by patient's insurance.) Accu-Check Guide/Accu-Check guide Me Georgina Speaks, FNP  Active   Lidocaine  HCl (ICY HOT MAX LIDOCAINE ) 4 % LIQD [id number]  Apply topically. Apply Four times daily as needed for nerve pain [provider]  Active   Magnesium  Glycinate 120 MG CAPS [id number]  Take 1 tablet by mouth every evening. Georgina Speaks, FNP  Active   meclizine  (ANTIVERT ) 25 MG tablet [id number]  Take 1 tablet (25 mg total) by mouth 3 (three) times daily as needed for dizziness. Georgina Speaks, FNP  Active   mometasone  (NASONEX ) 50 MCG/ACT nasal spray [id number]  Place 2 sprays into the nose daily. Georgina Speaks, FNP  Active   olmesartan -hydrochlorothiazide  (BENICAR  HCT) 40-25 MG tablet [id number]  Take 1 tablet by mouth daily. [provider]  Active   Olopatadine  HCl 0.6 % SOLN [id number]  Place 2 sprays into the nose 2 (two) times daily as needed. Jeneal Danita Macintosh, MD  Active   oxymetazoline  (AFRIN) 0.05 % nasal spray [id number]  Place 1 spray into both nostrils 2 (two) times daily as needed. [provider]  Active   pantoprazole  (PROTONIX ) 40 MG tablet [id number]  TAKE 1 TABLET BY MOUTH DAILY Petrina Pries, NP  Active   ULTRAM 50 MG tablet [id number]  Take 50 mg by mouth in the morning, at noon, and at bedtime. [provider]  Active   vitamin C  (ASCORBIC ACID ) 500 MG tablet [id number]  Take 500 mg by mouth daily. [provider]  Active Self  VITAMIN D  PO [id number]  Take 1 tablet by mouth daily. [provider]  Active             Recommendation:   PCP Follow-up with Speaks Georgina FNP on 12/09/23 at 2:00 PM for your physical  at [facility]  Specialty provider follow-up on 12/03/23 at 3:00 PM with  Jeneal Danita Macintosh, MD Department: [facility] [place]   Recommendations:  PCP follow up with Speaks Georgina FNP on 12/09/23 at 2:00 PM for your physical  Specialty provider follow up on 01/20/24 at 1:50 PM with  NEW PATIENT [[id number]] Copay: $15.00   Provider: Patel, Donika K, DO Department: [facility] [place]   Follow Up Plan:   Telephone follow up appointment with social worker Rolin Kerns LCSW date/time: Friday, October 17 at 1:30 AM  Telephone follow up appointment with nurse care manager Clayborne Ly RN date/time: Thursday, November 13 at 12:00 PM  Clayborne Ly RN BSN CCM [facility]  [facility], [facility] Nurse Care Coordinator  Direct Dial: [phone] Website: [email or web address]

## 2023-11-13 NOTE — Patient Instructions (Signed)
 Visit Information  Thank you for taking time to visit with me today. Please don't hesitate to contact me if I can be of assistance to you before our next scheduled appointment.  Your next care management appointment is by telephone on Thursday, November 13 at 12:00 PM  Please call the care guide team at [phone] if you need to cancel, schedule, or reschedule an appointment.   Please call 1-800-273-TALK (toll free, 24 hour hotline) if you are experiencing a Mental Health or Behavioral Health Crisis or need someone to talk to.  Clayborne Ly RN BSN CCM [place]  [facility], [facility] Nurse Care Coordinator  Direct Dial: [phone] Website: [email or web address]

## 2023-11-14 ENCOUNTER — Other Ambulatory Visit: Payer: Self-pay | Admitting: Licensed Clinical Social Worker

## 2023-11-14 ENCOUNTER — Telehealth: Payer: Self-pay

## 2023-11-14 NOTE — Telephone Encounter (Signed)
 PAP: Patient assistance application for Breztri  through [employer] (AZ&Me) has been mailed to pt's home address on file. Provider portion of application will be faxed to provider's office.

## 2023-11-18 ENCOUNTER — Other Ambulatory Visit: Payer: Self-pay | Admitting: Nurse Practitioner

## 2023-11-18 DIAGNOSIS — R5382 Chronic fatigue, unspecified: Secondary | ICD-10-CM

## 2023-11-18 DIAGNOSIS — R42 Dizziness and giddiness: Secondary | ICD-10-CM

## 2023-11-19 ENCOUNTER — Telehealth: Payer: Self-pay

## 2023-11-19 ENCOUNTER — Encounter: Payer: Self-pay | Admitting: Pharmacist

## 2023-11-19 NOTE — Telephone Encounter (Signed)
 PAP: Patient assistance application for Farxiga through [employer] (AZ&Me) has been mailed to pt's home address on file. Provider portion of application will be faxed to provider's office.

## 2023-11-19 NOTE — Progress Notes (Signed)
   11/19/2023  Patient ID: Donna Gilmore, female   DOB: 1940/07/21, 83 y.o.   MRN: [id number]  Received Provider signature on the AZ&Me renewal applications for 2026.  Breztri  and Farxiga  10 mg. Applications were faxed to Luke Mall, CPhT for processing.  Upcoming appointment 12/09/23 will follow up as she needs new labs.  Cassius DOROTHA Brought, PharmD, BCACP Clinical Pharmacist [phone]

## 2023-11-19 NOTE — Patient Instructions (Signed)
 Visit Information  Thank you for taking time to visit with me today. Please don't hesitate to contact me if I can be of assistance to you before our next scheduled appointment.  Your next care management appointment is by telephone on 12/5 at 1:30 PM  Please call the care guide team at [phone] if you need to cancel, schedule, or reschedule an appointment.   Please call the Suicide and Crisis Lifeline: 988 go to [facility] [address] [place] [id number]) call 911 if you are experiencing a Mental Health or Behavioral Health Crisis or need someone to talk to.  Rolin Kerns, LCSW [place]  [facility], [facility] Clinical Social Worker Direct Dial: [phone]  Fax: [phone] Website: [email or web address] 6:05 AM

## 2023-11-19 NOTE — Patient Outreach (Signed)
 Complex Care Management   Visit Note  11/14/2023  Name:  Donna Gilmore MRN: [id number] DOB: 1940-11-28  Situation: Referral received for Complex Care Management related to Mental/Behavioral Health diagnosis MDD I obtained verbal consent from Patient.  Visit completed with Patient  on the phone  Background:   Past Medical History:  Diagnosis Date   Acute left-sided low back pain with left-sided sciatica 05/04/2018   Benign paroxysmal positional vertigo 08/26/2012   Chest pain 09/21/2012   CVA (cerebral infarction) 08/25/2012   Diabetes mellitus without complication (HCC)    Dyslipidemia 08/25/2012   Essential hypertension, benign 08/25/2012   Hyperlipidemia    Hypertension    Meralgia paraesthetica, left 01/12/2018   Stroke [facility])     Assessment: Patient Reported Symptoms:  Cognitive Cognitive Status: No symptoms reported, Alert and oriented to person, place, and time, Normal speech and language skills Cognitive/Intellectual Conditions Management [RPT]: None reported or documented in medical history or problem list      Neurological Neurological Review of Symptoms: Not assessed    HEENT HEENT Symptoms Reported: Not assessed      Cardiovascular Cardiovascular Symptoms Reported: Not assessed    Respiratory Respiratory Symptoms Reported: Not assesed    Endocrine Endocrine Symptoms Reported: Not assessed    Gastrointestinal Gastrointestinal Symptoms Reported: Not assessed      Genitourinary Genitourinary Symptoms Reported: Not assessed    Integumentary Integumentary Symptoms Reported: Not assessed    Musculoskeletal Musculoskelatal Symptoms Reviewed: Difficulty walking, Limited mobility, Unsteady gait Additional Musculoskeletal Details: Foot pain Musculoskeletal Management Strategies: Medical device, Routine screening, Adequate rest Musculoskeletal Comment: Son reports pt went to a foot store and the arch support recommended cost $500 (pt was unable to afford the  cost) Family are interested in a referral to Orthopedics      Psychosocial Psychosocial Symptoms Reported: Other Other Psychosocial Conditions: Stress Behavioral Management Strategies: Adequate rest, Support system, Coping strategies        11/19/2023    PHQ2-9 Depression Screening   Little interest or pleasure in doing things    Feeling down, depressed, or hopeless    PHQ-2 - Total Score    Trouble falling or staying asleep, or sleeping too much    Feeling tired or having little energy    Poor appetite or overeating     Feeling bad about yourself - or that you are a failure or have let yourself or your family down    Trouble concentrating on things, such as reading the newspaper or watching television    Moving or speaking so slowly that other people could have noticed.  Or the opposite - being so fidgety or restless that you have been moving around a lot more than usual    Thoughts that you would be better off dead, or hurting yourself in some way    PHQ2-9 Total Score    If you checked off any problems, how difficult have these problems made it for you to do your work, take care of things at home, or get along with other people    Depression Interventions/Treatment      There were no vitals filed for this visit.  Medications Reviewed Today     Reviewed by Ezzard Rolin BIRCH, LCSW (Social Worker) on 11/19/23 at [phone]  Med List Status: <None>   Medication Order Taking? Sig Documenting Provider Last Dose Status Informant  0.9 %  sodium chloride  infusion [id number]   Abran Norleen SAILOR, MD  Active   acetaminophen  (TYLENOL ) 500 MG  tablet [id number]  Take 1 tablet (500 mg total) by mouth every 6 (six) hours as needed. Blaise Aleene KIDD, MD  Active   albuterol  (VENTOLIN  HFA) 108 [phone] Base) MCG/ACT inhaler [id number]  Inhale 2 puffs into the lungs every 6 (six) hours as needed for wheezing or shortness of breath. Georgina Speaks, FNP  Active   atorvastatin  (LIPITOR) 80 MG tablet [id number]  TAKE 1  TABLET BY MOUTH ONCE  DAILY Georgina Speaks, FNP  Active   Blood Glucose Monitoring Suppl (ACCU-CHEK GUIDE ME) w/Device KIT [id number]  USE TO TEST BLOOD SUGAR TWICE DAILY. (MAY SUBSTITUTE TO ANY MANUFACTURER COVERED BY PATIENT'S INSURANCE). Georgina Speaks, FNP  Active   budesonide -glycopyrrolate-formoterol  (BREZTRI  AEROSPHERE) 160-9-4.8 MCG/ACT AERO inhaler [id number]  Inhale 2 puffs into the lungs daily. Georgina Speaks, FNP  Active   cholecalciferol (VITAMIN D3) 25 MCG (1000 UNIT) tablet [id number]  Take 1,000 Units by mouth daily. [provider]  Active   clopidogrel  (PLAVIX ) 75 MG tablet [id number]  TAKE 1 TABLET BY MOUTH DAILY Georgina Speaks, FNP  Active   dapagliflozin  propanediol (FARXIGA ) 10 MG TABS tablet [id number]  Take 1 tablet (10 mg total) by mouth daily before breakfast. Georgina Speaks, FNP  Active            Med Note MACK, CASSIUS PARAS   Fri Oct 17, 2023  2:00 PM) GETS THROUGH PATIENT ASSISTANCE  diazepam  (VALIUM ) 5 MG tablet [id number]  Take 5 mg by mouth every 8 (eight) hours as needed for anxiety. [provider]  Active   donepezil  (ARICEPT ) 10 MG tablet [id number]  TAKE 1 TABLET BY MOUTH AT  BEDTIME Georgina Speaks, FNP  Active   ezetimibe  (ZETIA ) 10 MG tablet [id number]  TAKE 1 TABLET BY MOUTH DAILY Moore, Janece, FNP  Active   fexofenadine (ALLEGRA) 60 MG tablet [id number]  Take 60 mg by mouth 2 (two) times daily. [provider]  Active   gabapentin  (NEURONTIN ) 100 MG capsule [id number]  TAKE 1 CAPSULE BY MOUTH 3 TIMES  DAILY AS NEEDED Georgina Speaks, FNP  Active   Glucose Blood (BLOOD GLUCOSE TEST STRIPS) STRP [id number]  Use to test blood sugar twice daily. (May substitute to any manufacturer covered by patient's insurance.) Accu-Check Guide/Accu-Check guide Me Georgina Speaks, FNP  Active   Lancet Device MISC [id number]  Use to test blood sugar twice daily. (May substitute to any manufacturer covered by patient's insurance.) Accu-Check Guide/Accu-Check guide Me Georgina Speaks, FNP  Active   Lancets Misc. MISC [id number]  Use to test blood sugar twice daily. (May substitute to any manufacturer covered by patient's insurance.) Accu-Check Guide/Accu-Check guide Me Georgina Speaks, FNP  Active   Lidocaine  HCl (ICY HOT MAX LIDOCAINE ) 4 % LIQD [id number]  Apply topically. Apply Four times daily as needed for nerve pain [provider]  Active   Magnesium  Glycinate 120 MG CAPS [id number]  Take 1 tablet by mouth every evening. Georgina Speaks, FNP  Active   meclizine  (ANTIVERT ) 25 MG tablet [id number]  Take 1 tablet (25 mg total) by mouth 3 (three) times daily as needed for dizziness. Georgina Speaks, FNP  Active   mometasone  (NASONEX ) 50 MCG/ACT nasal spray [id number]  Place 2 sprays into the nose daily. Georgina Speaks, FNP  Active   olmesartan -hydrochlorothiazide  (BENICAR  HCT) 40-25 MG tablet [id number]  Take 1 tablet by mouth daily. [provider]  Active   Olopatadine  HCl 0.6 % SOLN [id number]  Place 2 sprays into the nose 2 (two) times daily as needed. Jeneal Shaker  Avelina, MD  Active   oxymetazoline  (AFRIN) 0.05 % nasal spray [id number]  Place 1 spray into both nostrils 2 (two) times daily as needed. [provider]  Active   pantoprazole  (PROTONIX ) 40 MG tablet [id number]  TAKE 1 TABLET BY MOUTH DAILY Petrina Pries, NP  Active   ULTRAM 50 MG tablet [id number]  Take 50 mg by mouth in the morning, at noon, and at bedtime. [provider]  Active   vitamin C  (ASCORBIC ACID ) 500 MG tablet [id number]  Take 500 mg by mouth daily. [provider]  Active Self  VITAMIN D  PO [id number]  Take 1 tablet by mouth daily. [provider]  Active             Recommendation:   Continue Current Plan of Care  Follow Up Plan:   Telephone follow-up in 1 month  Rolin Kerns, LCSW [facility]  [facility], [facility] Clinical Social Worker Direct Dial: [phone]  Fax: [phone] Website:  [email or web address] 6:05 AM

## 2023-11-20 NOTE — Telephone Encounter (Signed)
 Received Provider Portion PAP application (AZ&ME) Farxiga .

## 2023-11-20 NOTE — Telephone Encounter (Signed)
 Received provider portion PAP application AZ&ME Breztri 

## 2023-11-26 DIAGNOSIS — R42 Dizziness and giddiness: Secondary | ICD-10-CM | POA: Diagnosis not present

## 2023-12-01 ENCOUNTER — Encounter: Payer: Self-pay | Admitting: Radiology

## 2023-12-03 ENCOUNTER — Other Ambulatory Visit: Payer: Self-pay

## 2023-12-03 ENCOUNTER — Encounter: Payer: Self-pay | Admitting: Allergy

## 2023-12-03 ENCOUNTER — Ambulatory Visit: Admitting: Allergy

## 2023-12-03 VITALS — BP 118/70 | HR 63 | Temp 97.8°F | Resp 16

## 2023-12-03 DIAGNOSIS — R208 Other disturbances of skin sensation: Secondary | ICD-10-CM

## 2023-12-03 DIAGNOSIS — J310 Chronic rhinitis: Secondary | ICD-10-CM | POA: Diagnosis not present

## 2023-12-03 NOTE — Patient Outreach (Signed)
 Attempted to return call to son Jerrell listed on patient's dpr. Determined he is unavailable to speak with me at this time. Rescheduled a call with son Jerrell for tomorrow morning, 12/04/23 at 8:30 AM.   Clayborne Ly RN BSN CCM [place]  [facility], [facility] Nurse Care Coordinator  Direct Dial: [phone] Website: [email or web address]

## 2023-12-03 NOTE — Patient Instructions (Addendum)
 Chronic rhinitis Nasal burning - Continue avoidance measures for dust mites and cockroach.  - Air purifiers help to purify the air you breathe in and reduce your allergen load in the home.  - Will place ENT referral for assessment of nasal anatomy - Use of Patanase  nasal spray, Atrovent  nasal spray have not been effective in controlling nasal symptoms.  -  Use of Pataday  eye drops not been effective in controlling eye symptoms  -  For now continue Claritin  1-2 tabs a day to help control allergy symptoms and itch.  If using Claritin  do not take Xyzal   Follow-up in 6 months or sooner if needed

## 2023-12-03 NOTE — Progress Notes (Unsigned)
 Follow-up Note  RE: Donna Gilmore MRN: [id number] DOB: 1940/09/30 Date of Office Visit: 12/03/2023  History of present illness: Donna Gilmore is a 83 y.o. female presenting today for follow-up of allergic rhinitis and nasal burning.  She was last seen in the office on 06/04/23 by myself.  He presents today with her son. Discussed the use of AI scribe software for clinical note transcription with the patient, who gave verbal consent to proceed.  She experiences severe nasal burning, particularly upon waking, localized to one side of her nose. The sensation is intense, described as feeling like she could 'knock a hole in the wall.'  She has been using olopatadine  eye drops and Patanase  nasal spray without relief. The nasal spray causes a burning sensation that distracts from the pain but does not alleviate it. She also experiences mucus drainage, which sometimes runs out of her nose and mouth, particularly on one side.  Her symptoms include itchy eyes, occurring both at night and during the day, despite limited outdoor exposure. She has a history of allergies to dust mites and cockroaches, identified through previous allergy testing. Traditional allergy treatments, including Claritin  and Xyzal , have not been effective.  Review of systems: 10pt ROS negative unless noted above in HPI  Past medical/social/surgical/family history have been reviewed and are unchanged unless specifically indicated below.  No changes  Medication List: Current Outpatient Medications  Medication Sig Dispense Refill   acetaminophen  (TYLENOL ) 500 MG tablet Take 1 tablet (500 mg total) by mouth every 6 (six) hours as needed. 30 tablet 0   albuterol  (VENTOLIN  HFA) 108 (90 Base) MCG/ACT inhaler Inhale 2 puffs into the lungs every 6 (six) hours as needed for wheezing or shortness of breath. 8.5 g 2   atorvastatin  (LIPITOR) 80 MG tablet TAKE 1 TABLET BY MOUTH ONCE  DAILY 100 tablet 2   Blood Glucose Monitoring  Suppl (ACCU-CHEK GUIDE ME) w/Device KIT USE TO TEST BLOOD SUGAR TWICE DAILY. (MAY SUBSTITUTE TO ANY MANUFACTURER COVERED BY PATIENT'S INSURANCE). 1 kit 0   budesonide -glycopyrrolate-formoterol  (BREZTRI  AEROSPHERE) 160-9-4.8 MCG/ACT AERO inhaler Inhale 2 puffs into the lungs daily. 32.1 g 3   cholecalciferol (VITAMIN D3) 25 MCG (1000 UNIT) tablet Take 1,000 Units by mouth daily.     clopidogrel  (PLAVIX ) 75 MG tablet TAKE 1 TABLET BY MOUTH DAILY 100 tablet 2   dapagliflozin  propanediol (FARXIGA ) 10 MG TABS tablet Take 1 tablet (10 mg total) by mouth daily before breakfast. 90 tablet 2   diazepam  (VALIUM ) 5 MG tablet Take 5 mg by mouth every 8 (eight) hours as needed for anxiety.     donepezil  (ARICEPT ) 10 MG tablet TAKE 1 TABLET BY MOUTH AT  BEDTIME 100 tablet 2   ezetimibe  (ZETIA ) 10 MG tablet TAKE 1 TABLET BY MOUTH DAILY 100 tablet 2   fexofenadine (ALLEGRA) 60 MG tablet Take 60 mg by mouth 2 (two) times daily.     gabapentin  (NEURONTIN ) 100 MG capsule TAKE 1 CAPSULE BY MOUTH 3 TIMES  DAILY AS NEEDED 300 capsule 2   Glucose Blood (BLOOD GLUCOSE TEST STRIPS) STRP Use to test blood sugar twice daily. (May substitute to any manufacturer covered by patient's insurance.) Accu-Check Guide/Accu-Check guide Me 100 strip 12   Lancet Device MISC Use to test blood sugar twice daily. (May substitute to any manufacturer covered by patient's insurance.) Accu-Check Guide/Accu-Check guide Me 1 each 0   Lancets Misc. MISC Use to test blood sugar twice daily. (May substitute to any manufacturer covered by  patient's insurance.) Accu-Check Guide/Accu-Check guide Me 100 each 12   Lidocaine  HCl (ICY HOT MAX LIDOCAINE ) 4 % LIQD Apply topically. Apply Four times daily as needed for nerve pain     Magnesium  Glycinate 120 MG CAPS Take 1 tablet by mouth every evening. 90 capsule 1   meclizine  (ANTIVERT ) 25 MG tablet Take 1 tablet (25 mg total) by mouth 3 (three) times daily as needed for dizziness. 60 tablet 1   mometasone   (NASONEX ) 50 MCG/ACT nasal spray Place 2 sprays into the nose daily. 1 each 2   olmesartan -hydrochlorothiazide  (BENICAR  HCT) 40-25 MG tablet Take 1 tablet by mouth daily.     Olopatadine  HCl 0.6 % SOLN Place 2 sprays into the nose 2 (two) times daily as needed. 30.5 g 5   oxymetazoline  (AFRIN) 0.05 % nasal spray Place 1 spray into both nostrils 2 (two) times daily as needed.     pantoprazole  (PROTONIX ) 40 MG tablet TAKE 1 TABLET BY MOUTH DAILY 100 tablet 2   ULTRAM 50 MG tablet Take 50 mg by mouth in the morning, at noon, and at bedtime.     vitamin C  (ASCORBIC ACID ) 500 MG tablet Take 500 mg by mouth daily.     VITAMIN D  PO Take 1 tablet by mouth daily.     Current Facility-Administered Medications  Medication Dose Route Frequency Provider Last Rate Last Admin   0.9 %  sodium chloride  infusion  500 mL Intravenous Once Abran Norleen SAILOR, MD         Known medication allergies: No Known Allergies   Physical examination: Blood pressure 118/70, pulse 63, temperature 97.8 F (36.6 C), resp. rate 16, SpO2 97%.  General: Alert, interactive, in no acute distress. HEENT: PERRLA, turbinates non-edematous without discharge, post-pharynx non erythematous. Neck: Supple without lymphadenopathy. Lungs: Clear to auscultation without wheezing, rhonchi or rales. {no increased work of breathing. CV: Normal S1, S2 without murmurs. Abdomen: Nondistended, nontender. Skin: Warm and dry, without lesions or rashes. Extremities:  No clubbing, cyanosis or edema. Neuro:   Grossly intact.  Diagnostics/Labs: None today  Assessment and plan: Chronic rhinitis Nasal burning - Continue avoidance measures for dust mites and cockroach.  - Air purifiers help to purify the air you breathe in and reduce your allergen load in the home.  - Will place ENT referral for assessment of nasal anatomy - Use of Patanase  nasal spray, Atrovent  nasal spray have not been effective in controlling nasal symptoms.  -  Use of Pataday   eye drops not been effective in controlling eye symptoms  -  For now continue Claritin  1-2 tabs a day to help control allergy symptoms and itch.    Follow-up in 6 months or sooner if needed  I appreciate the opportunity to take part in Dawanna's care. Please do not hesitate to contact me with questions.  Sincerely,   Danita Brain, MD Allergy/Immunology [facility]

## 2023-12-04 ENCOUNTER — Ambulatory Visit: Attending: Student | Admitting: Student

## 2023-12-04 ENCOUNTER — Encounter: Payer: Self-pay | Admitting: Student

## 2023-12-04 ENCOUNTER — Other Ambulatory Visit

## 2023-12-04 VITALS — BP 110/60 | HR 82 | Ht 66.0 in | Wt 184.0 lb

## 2023-12-04 DIAGNOSIS — Z87898 Personal history of other specified conditions: Secondary | ICD-10-CM

## 2023-12-04 DIAGNOSIS — E785 Hyperlipidemia, unspecified: Secondary | ICD-10-CM

## 2023-12-04 DIAGNOSIS — R42 Dizziness and giddiness: Secondary | ICD-10-CM

## 2023-12-04 DIAGNOSIS — R6889 Other general symptoms and signs: Secondary | ICD-10-CM | POA: Diagnosis not present

## 2023-12-04 DIAGNOSIS — Z8673 Personal history of transient ischemic attack (TIA), and cerebral infarction without residual deficits: Secondary | ICD-10-CM

## 2023-12-04 DIAGNOSIS — I10 Essential (primary) hypertension: Secondary | ICD-10-CM | POA: Diagnosis not present

## 2023-12-04 DIAGNOSIS — E118 Type 2 diabetes mellitus with unspecified complications: Secondary | ICD-10-CM

## 2023-12-04 NOTE — Patient Instructions (Addendum)
 Medication Instructions:  Your physician recommends that you continue on your current medications as directed. Please refer to the Current Medication list given to you today. *If you need a refill on your cardiac medications before your next appointment, please call your pharmacy*  Lab Work: None ordered If you have labs (blood work) drawn today and your tests are completely normal, you will receive your results only by: MyChart Message (if you have MyChart) OR A paper copy in the mail If you have any lab test that is abnormal or we need to change your treatment, we will call you to review the results.  Testing/Procedures: None ordered  Follow-Up: At [facility], you and your health needs are our priority.  As part of our continuing mission to provide you with exceptional heart care, our providers are all part of one team.  This team includes your primary Cardiologist (physician) and Advanced Practice Providers or APPs (Physician Assistants and Nurse Practitioners) who all work together to provide you with the care you need, when you need it.  Your next appointment:   6 month(s)  Provider:   K Chad Hilty, MD   We recommend signing up for the patient portal called MyChart.  Sign up information is provided on this After Visit Summary.  MyChart is used to connect with patients for Virtual Visits (Telemedicine).  Patients are able to view lab/test results, encounter notes, upcoming appointments, etc.  Non-urgent messages can be sent to your provider as well.   To learn more about what you can do with MyChart, go to [email or web address].   Other Instructions Please follow up with primary care provider, neurology or your ear, nose and throat doctor.  Please a copy of your blood pressure readings to our office

## 2023-12-04 NOTE — Patient Instructions (Signed)
 Visit Information  Thank you for taking time to visit with me today. Please don't hesitate to contact me if I can be of assistance to you before our next scheduled appointment.  Your next care management appointment is by telephone on Friday, November 13 at 12:00 PM  Please call the care guide team at [phone] if you need to cancel, schedule, or reschedule an appointment.   Please call 1-800-273-TALK (toll free, 24 hour hotline) if you are experiencing a Mental Health or Behavioral Health Crisis or need someone to talk to.  Clayborne Ly RN BSN CCM [place]  [facility], [facility] Nurse Care Coordinator  Direct Dial: [phone] Website: [email or web address]

## 2023-12-04 NOTE — Progress Notes (Signed)
 Cardiology Office Note:    Date:  12/04/2023   ID:  [name], DOB 08-20-1940, MRN [id number]  PCP:  Donna Speaks, FNP  Cardiologist:  Donna JAYSON Maxcy, MD     Referring MD: Donna Speaks, FNP   Chief Complaint: follow-up of atypical chest pain  History of Present Illness:    Donna Gilmore is a 83 y.o. female with a history of atypical chest pain with negative Myoview  in 03/2023, hypertension, hyperlipidemia, type 2 diabetes mellitus, CVA in 2014, and vertigo who is followed by Dr. Maxcy and presents today for follow-up.  Remote Myoview  in 2014 was negative for ischemia.  ABIs in 2017 and again in 06/2021 were normal.  She was last seen by Dr. Maxcy in 03/2023 after not being seen since 08/2021.  At that visit, she reported progressive fatigue as well as some left-sided arm pain and shoulder pain that radiated to the left chest.  Most of her pain was made worse by laying on her side.  Pain sounded musculoskeletal but a Myoview  and Echo were ordered given multiple CV risk factors.  Echo showed LVEF of 65-70% with normal wall motion and grade 1 diastolic dysfunction, normal RV function, aortic valve sclerosis/calcification without evidence of aortic stenosis, mild mitral regurgitation, and mild tricuspid regurgitation.  Myoview  was low risk with no evidence of ischemia or infarction.  She was evaluated at her PCPs office in 09/2023 for further evaluation of fatigue and dizziness.  She has a long history of dizziness felt to be secondary to her prior stroke but she felt like this was worsening.  BP was soft in the office with systolic BP of 100.  Therefore, it worsening dizziness felt to be due to orthostatic hypotension and possible dehydration due to Olmesartan -HCTZ and Farxiga . It was recommended that patient change the time of day she took this medications.   A 2-week ZIO monitor was ordered and she was advised to schedule a visit with her Neurologist for further evaluation of her  dizziness.  Monitor showed rare PACs/PVCs but no significant arrhythmias; however, she only wore the monitor for 21 hours.  Patient presents today for follow-up.  She is here with her son.  Patient is somewhat of a difficult historian and keeps perseverating on certain complaints.  I had to redirect her multiple times in her son had to assist with this.  Her main complaint today is dizziness over the last 6-12 months. However, the more she describes this the last that sounds like dizziness and the more she describes just a heavy feeling in her head and a headache.  She also describes some sinus issues and eye pain.  She states this is really bad first thing in the morning around 6-7am and she had difficulty getting out of bed. By the afternoon, she feels okay.  However, she denies any of the symptoms when she has to get up around 3am to use the restroom.  Her other main complaint today was fatigue.  She states that she does not sleep well and has terrible dreams which she kept bringing up.  From a cardiac standpoint, she is on stable.  She has some chronic dyspnea on exertion but states this is stable.  No chest pain, shortness of breath at rest, orthopnea, PND, lower extremity edema.  Her dizziness does not sound cardiac in nature.  She denies any palp potation's or syncope.  EKGs/Labs/Other Studies Reviewed:    The following studies were reviewed:  Echocardiogram 04/15/2023: Impressions:  1. Left ventricular ejection fraction, by estimation, is 65 to 70%. The  left ventricle has normal function. The left ventricle has no regional  wall motion abnormalities. Left ventricular diastolic parameters are  consistent with Grade I diastolic  dysfunction (impaired relaxation). Elevated left atrial pressure. The  average left ventricular global longitudinal strain is -19.1 %. The global  longitudinal strain is normal.   2. Right ventricular systolic function is normal. The right ventricular  size is  normal. Tricuspid regurgitation signal is inadequate for assessing  PA pressure.   3. Left atrial size was mildly dilated.   4. The mitral valve is normal in structure. Mild mitral valve  regurgitation.   5. The aortic valve is tricuspid. Aortic valve regurgitation is not  visualized. Aortic valve sclerosis/calcification is present, without any  evidence of aortic stenosis.   6. The inferior vena cava is normal in size with greater than 50%  respiratory variability, suggesting right atrial pressure of 3 mmHg.  _______________  Myoview  04/17/2023:   The study is normal. The study is low risk.   No ST deviation was noted.   LV perfusion is normal. There is no evidence of ischemia. There is no evidence of infarction.   Left ventricular function is normal. Nuclear stress EF: 78%. The left ventricular ejection fraction is hyperdynamic (>65%). End diastolic cavity size is normal. End systolic cavity size is normal.   Prior study available for comparison from 09/22/2012.   Normal stress nuclear study with no ischemia or infarction.  Gated ejection fraction 78% with normal wall motion. _______________  Monitor 10/24/2023 to 10/25/2023: HR 39 - 107, average 69 bpm. There were no sustained or non-sustained arrhythmias. No atrial fibrillation detected. Rare supraventricular ectopy. Rare ventricular ectopy. No symptom trigger episodes.  EKG:  EKG not ordered today.   Recent Labs: 07/13/2023: ALT 18; BUN 16; Creatinine, Ser 1.62; Hemoglobin 12.6; Platelets 250; Potassium 3.8; Sodium 138  Recent Lipid Panel    Component Value Date/Time   CHOL 159 05/05/2023 1651   TRIG 217 (H) 05/05/2023 1651   HDL 51 05/05/2023 1651   CHOLHDL 3.1 05/05/2023 1651   CHOLHDL 8.7 08/26/2012 0538   VLDL 60 (H) 08/26/2012 0538   LDLCALC 72 05/05/2023 1651    Physical Exam:    Vital Signs: BP 110/60   Pulse 82   Ht 5' 6 (1.676 m)   Wt 184 lb (83.5 kg)   SpO2 95%   BMI 29.70 kg/m     Wt Readings from  Last 3 Encounters:  12/04/23 184 lb (83.5 kg)  10/16/23 181 lb 9.6 oz (82.4 kg)  09/23/23 187 lb 6.4 oz (85 kg)     General: 83 y.o. female in no acute distress. HEENT: Normocephalic and atraumatic. Sclera clear.  Neck: Supple. No carotid bruits. No JVD. Heart: RRR. Distinct S1 and S2. No murmurs, gallops, or rubs.  Lungs: No increased work of breathing. Clear to ausculation bilaterally. No wheezes, rhonchi, or rales.  Extremities: No lower extremity edema.   Skin: Warm and dry. Neuro: No focal deficits. Psych: Normal affect. Responds appropriately.   Assessment:    1. Dizziness   2. Fullness in head   3. History of chest pain   4. Primary hypertension   5. Hyperlipidemia, unspecified hyperlipidemia type   6. Type 2 diabetes mellitus with complication, without long-term current use of insulin  (HCC)   7. History of CVA (cerebrovascular accident)     Plan:    Dizziness Head Fullness Patient's main  complaint today is dizziness over the last 6-12 months. However, when she describes this, she really describes more of a heaviness/ fullness in her head rather than dizziness. Recent monitor in 09/2023 showed rare PACs/PVCs but no significant arrhythmias; however, she only wore the monitor for 21 hours. - Orthostatic vital signs negative today.  - Symptoms do not sound cardiac in nature. No additional cardiac work-up necessary. She has been referred to Neurology and is scheduled to see Donna Gilmore on 01/20/2024. She has also been referred to ENT.   History of Chest Pain Patient described left arm/shoulder/chest at last visit in 03/2023.  Myoview  was low risk and negative for ischemia or infarction.  Pain was felt to likely be musculoskeletal. - She denies any recurrent chest pain.  - No additional work-up necessary.  Hypertension BP well controlled. - Continue Olmesartan -HCTZ 40-25mg  daily.  - BP was soft at visit with PCP in 09/2023. Asked patient to send me a log of her home BP/HR.   If BP is soft on home log, will consider stopping HCTZ and considering Olmesartan  alone.   Hyperlipidemia Lipid panel in 04/2023: Total Cholesterol 159, Triglycerides 217, HDL 51, LDL 72. LDL goal <55. - Continue Lipitor 80mg  daily and Zetia  10mg  daily.  - Labs followed by PCP. LDL is not quite at goal. However, she is feeling really poorly so will hold off on making any changes for now. Can re-discuss at follow-up visit.   Type 2 Diabetes Mellitus Hemoglobin A1c 8.1 in 04/2023. - On Farxiga  10mg  daliy.  - Management per PCP.  History of CVA History of CVA in 2014.  - Continue Plavix  75mg  daily. - Continue Lipitor 80mg  daily and Zetia  10mg  daily.  Disposition: Follow up in 6 months with Donna Gilmore.   Signed, Donna Petrilla E Jerrald Doverspike, Donna Gilmore  12/04/2023 6:59 PM     [facility]

## 2023-12-04 NOTE — Patient Outreach (Addendum)
 Complex Care Management   Visit Note  12/04/2023  Name:  Donna Gilmore MRN: [id number] DOB: Oct 28, 1940  Situation: Referral received for Complex Care Management related to CVA, Diabetes with Complications, and Sensorineural hearing loss of both ears, chronic back and shoulder pain, mild cognitive impairment, fatigue, Benign paroxysmal positional vertigo, COPD with Asthma, OSA. I obtained verbal consent from Patient.  Visit completed with Patient on the phone.  Background:   Past Medical History:  Diagnosis Date   Acute left-sided low back pain with left-sided sciatica 05/04/2018   Benign paroxysmal positional vertigo 08/26/2012   Chest pain 09/21/2012   CVA (cerebral infarction) 08/25/2012   Diabetes mellitus without complication (HCC)    Dyslipidemia 08/25/2012   Essential hypertension, benign 08/25/2012   Hyperlipidemia    Hypertension    Meralgia paraesthetica, left 01/12/2018   Stroke [facility])     Assessment: Patient Reported Symptoms:  Cognitive Cognitive Status: Alert and oriented to person, place, and time, Normal speech and language skills Cognitive/Intellectual Conditions Management [RPT]: None reported or documented in medical history or problem list   Health Maintenance Behaviors: Annual physical exam, Healthy diet, Immunizations, Sleep adequate Health Facilitated by: Healthy diet, Rest  Neurological Neurological Review of Symptoms: Dizziness Neurological Management Strategies: Medication therapy, Routine screening, Adequate rest Neurological Self-Management Outcome: 2 (bad) Neurological Comment: Instructed patient and son Jerrell re: frequency of use of Meclizine  once daily per PCP  HEENT HEENT Symptoms Reported: Nasal discharge (chronic sinusitis) HEENT Management Strategies: Routine screening, Medical device HEENT Self-Management Outcome: 3 (uncertain)    Cardiovascular Cardiovascular Symptoms Reported: Lightheadness Does patient have uncontrolled Hypertension?:  No Cardiovascular Management Strategies: Medication therapy, Adequate rest, Routine screening Cardiovascular Self-Management Outcome: 3 (uncertain) Cardiovascular Comment: Educated patient and son Jerrell re: PCP recommendations for establishment with Cardiology for further evaluation  Respiratory Respiratory Symptoms Reported: Not assesed    Endocrine Endocrine Symptoms Reported: Not assessed    Gastrointestinal Gastrointestinal Symptoms Reported: Not assessed      Genitourinary Genitourinary Symptoms Reported: Not assessed    Integumentary Integumentary Symptoms Reported: Not assessed    Musculoskeletal Musculoskelatal Symptoms Reviewed: Not assessed        Psychosocial Psychosocial Symptoms Reported: Other Other Psychosocial Conditions: Stress related to chronic health condition Behavioral Management Strategies: Support system, Adequate rest, Coping strategies Behavioral Health Self-Management Outcome: 4 (good) Major Change/Loss/Stressor/Fears (CP): Medical condition, self Techniques to Cope with Loss/Stress/Change: Diversional activities (Adequate rest) Quality of Family Relationships: helpful, involved, supportive Do you feel physically threatened by others?: No    12/04/2023    PHQ2-9 Depression Screening   Marshell Dilauro interest or pleasure in doing things    Feeling down, depressed, or hopeless    PHQ-2 - Total Score    Trouble falling or staying asleep, or sleeping too much    Feeling tired or having Ahmya Bernick energy    Poor appetite or overeating     Feeling bad about yourself - or that you are a failure or have let yourself or your family down    Trouble concentrating on things, such as reading the newspaper or watching television    Moving or speaking so slowly that other people could have noticed.  Or the opposite - being so fidgety or restless that you have been moving around a lot more than usual    Thoughts that you would be better off dead, or hurting yourself in some  way    PHQ2-9 Total Score    If you checked off any problems, how difficult have  these problems made it for you to do your work, take care of things at home, or get along with other people    Depression Interventions/Treatment      There were no vitals filed for this visit.  Medications Reviewed Today     Reviewed by Morgan Clayborne CROME, RN (Registered Nurse) on 12/04/23 at [id number]  Med List Status: <None>   Medication Order Taking? Sig Documenting Provider Last Dose Status Informant  0.9 %  sodium chloride  infusion [id number]   Abran Norleen SAILOR, MD  Active   acetaminophen  (TYLENOL ) 500 MG tablet [id number]  Take 1 tablet (500 mg total) by mouth every 6 (six) hours as needed. Blaise Aleene KIDD, MD  Active   albuterol  (VENTOLIN  HFA) 108 [phone] Base) MCG/ACT inhaler [id number]  Inhale 2 puffs into the lungs every 6 (six) hours as needed for wheezing or shortness of breath. Georgina Speaks, FNP  Active   atorvastatin  (LIPITOR) 80 MG tablet [id number]  TAKE 1 TABLET BY MOUTH ONCE  DAILY Georgina Speaks, FNP  Active   Blood Glucose Monitoring Suppl (ACCU-CHEK GUIDE ME) w/Device KIT [id number]  USE TO TEST BLOOD SUGAR TWICE DAILY. (MAY SUBSTITUTE TO ANY MANUFACTURER COVERED BY PATIENT'S INSURANCE). Georgina Speaks, FNP  Active   budesonide -glycopyrrolate-formoterol  (BREZTRI  AEROSPHERE) 160-9-4.8 MCG/ACT AERO inhaler [id number]  Inhale 2 puffs into the lungs daily. Georgina Speaks, FNP  Active   cholecalciferol (VITAMIN D3) 25 MCG (1000 UNIT) tablet [id number]  Take 1,000 Units by mouth daily. [provider]  Active   clopidogrel  (PLAVIX ) 75 MG tablet [id number]  TAKE 1 TABLET BY MOUTH DAILY Moore, Janece, FNP  Active   dapagliflozin  propanediol (FARXIGA ) 10 MG TABS tablet [id number]  Take 1 tablet (10 mg total) by mouth daily before breakfast. Georgina Speaks, FNP  Active            Med Note MACK, CASSIUS PARAS   Fri Oct 17, 2023  2:00 PM) GETS THROUGH PATIENT ASSISTANCE  diazepam  (VALIUM ) 5 MG tablet [id number]  Take 5 mg  by mouth every 8 (eight) hours as needed for anxiety. [provider]  Active   donepezil  (ARICEPT ) 10 MG tablet [id number]  TAKE 1 TABLET BY MOUTH AT  BEDTIME Georgina Speaks, FNP  Active   ezetimibe  (ZETIA ) 10 MG tablet [id number]  TAKE 1 TABLET BY MOUTH DAILY Moore, Janece, FNP  Active   fexofenadine (ALLEGRA) 60 MG tablet [id number]  Take 60 mg by mouth 2 (two) times daily. [provider]  Active   gabapentin  (NEURONTIN ) 100 MG capsule [id number]  TAKE 1 CAPSULE BY MOUTH 3 TIMES  DAILY AS NEEDED Georgina Speaks, FNP  Active   Glucose Blood (BLOOD GLUCOSE TEST STRIPS) STRP [id number]  Use to test blood sugar twice daily. (May substitute to any manufacturer covered by patient's insurance.) Accu-Check Guide/Accu-Check guide Me Georgina Speaks, FNP  Active   Lancet Device MISC [id number]  Use to test blood sugar twice daily. (May substitute to any manufacturer covered by patient's insurance.) Accu-Check Guide/Accu-Check guide Me Georgina Speaks, FNP  Active   Lancets Misc. MISC [id number]  Use to test blood sugar twice daily. (May substitute to any manufacturer covered by patient's insurance.) Accu-Check Guide/Accu-Check guide Me Georgina Speaks, FNP  Active   Lidocaine  HCl (ICY HOT MAX LIDOCAINE ) 4 % LIQD [id number]  Apply topically. Apply Four times daily as needed for nerve pain [provider]  Active   Magnesium  Glycinate 120 MG CAPS [id number]  Take 1 tablet by mouth every evening. Georgina Speaks, FNP  Active   meclizine  (ANTIVERT ) 25 MG tablet [id number]  Take 1 tablet (25 mg total) by mouth 3 (three) times daily as needed for dizziness. Georgina Speaks, FNP  Active   mometasone  (NASONEX ) 50 MCG/ACT nasal spray [id number]  Place 2 sprays into the nose daily. Georgina Speaks, FNP  Active   olmesartan -hydrochlorothiazide  (BENICAR  HCT) 40-25 MG tablet [id number]  Take 1 tablet by mouth daily. [provider]  Active   Olopatadine  HCl 0.6 % SOLN [id number]  Place 2 sprays into the nose 2  (two) times daily as needed. Jeneal Danita Macintosh, MD  Active   oxymetazoline  (AFRIN) 0.05 % nasal spray [id number]  Place 1 spray into both nostrils 2 (two) times daily as needed. [provider]  Active   pantoprazole  (PROTONIX ) 40 MG tablet [id number]  TAKE 1 TABLET BY MOUTH DAILY Petrina Pries, NP  Active   ULTRAM 50 MG tablet [id number]  Take 50 mg by mouth in the morning, at noon, and at bedtime. [provider]  Active   vitamin C  (ASCORBIC ACID ) 500 MG tablet [id number]  Take 500 mg by mouth daily. [provider]  Active Self  VITAMIN D  PO [id number]  Take 1 tablet by mouth daily. [provider]  Active             Recommendation:   PCP Follow-up with Speaks Georgina FNP on 12/09/23 2:00 PM Length: 20   Visit Type: PHYSICAL [8004] Copay: $0.00  Provider: Georgina Speaks, FNP Department: [facility]   Specialty provider follow-up on 12/04/23 3:10 PM Length: 25   Visit Type: OFFICE VISIT [1004] Copay: $15.00  Provider: Goodrich, Callie E, PA-C Department: [facility]   Follow Up Plan:   Telephone follow up appointment date/time:  Thursday, November 13 at 12:00 PM  Clayborne Ly RN BSN CCM [place]  [facility], [facility] Nurse Care Coordinator  Direct Dial: [phone] Website: [email or web address]

## 2023-12-09 ENCOUNTER — Encounter: Payer: Self-pay | Admitting: Nurse Practitioner

## 2023-12-09 ENCOUNTER — Ambulatory Visit (INDEPENDENT_AMBULATORY_CARE_PROVIDER_SITE_OTHER): Payer: Self-pay | Admitting: Nurse Practitioner

## 2023-12-09 VITALS — BP 120/70 | HR 68 | Temp 98.2°F | Ht 66.0 in | Wt 186.0 lb

## 2023-12-09 DIAGNOSIS — E1122 Type 2 diabetes mellitus with diabetic chronic kidney disease: Secondary | ICD-10-CM

## 2023-12-09 DIAGNOSIS — Z1231 Encounter for screening mammogram for malignant neoplasm of breast: Secondary | ICD-10-CM

## 2023-12-09 DIAGNOSIS — K5909 Other constipation: Secondary | ICD-10-CM

## 2023-12-09 DIAGNOSIS — Z0000 Encounter for general adult medical examination without abnormal findings: Secondary | ICD-10-CM | POA: Diagnosis not present

## 2023-12-09 DIAGNOSIS — N1832 Chronic kidney disease, stage 3b: Secondary | ICD-10-CM

## 2023-12-09 DIAGNOSIS — E1140 Type 2 diabetes mellitus with diabetic neuropathy, unspecified: Secondary | ICD-10-CM | POA: Diagnosis not present

## 2023-12-09 DIAGNOSIS — I129 Hypertensive chronic kidney disease with stage 1 through stage 4 chronic kidney disease, or unspecified chronic kidney disease: Secondary | ICD-10-CM

## 2023-12-09 DIAGNOSIS — E782 Mixed hyperlipidemia: Secondary | ICD-10-CM

## 2023-12-09 DIAGNOSIS — R3981 Functional urinary incontinence: Secondary | ICD-10-CM

## 2023-12-09 DIAGNOSIS — H8113 Benign paroxysmal vertigo, bilateral: Secondary | ICD-10-CM

## 2023-12-09 DIAGNOSIS — Z79899 Other long term (current) drug therapy: Secondary | ICD-10-CM

## 2023-12-09 NOTE — Progress Notes (Signed)
 I,[name], CMA,acting as a neurosurgeon for [employer], FNP.,have documented all relevant documentation on the behalf of [name], FNP,as directed by  [name], FNP while in the presence of [name], FNP.  Subjective:    Patient ID: Donna Gilmore , female    DOB: September 25, 1940 , 83 y.o.   MRN: [id number]  Chief Complaint  Patient presents with   Annual Exam    Patient presents today for HM, patient reports compliance with medications. Patient denies any chest pain, SOB, or headaches. Patient's son wants to know how many times she is supposed to take the meclizine . She reports she is still having a lot of dizziness.       HPI Discussed the use of AI scribe software for clinical note transcription with the patient, who gave verbal consent to proceed.  History of Present Illness Donna Gilmore is an 83 year old female who presents for an annual physical exam.  She experiences restless sleep with frequent, vivid dreams and has difficulty functioning upon waking. She describes a sensation in her head as 'a dizzy, air-filled headache' that is not a typical headache. She uses meclizine  as needed for dizziness, particularly when getting out of bed. She has an upcoming appointment with a neurologist on January 20, 2024, for further evaluation.  She experiences urinary incontinence every two to three days, requiring management. She also reports constipation, which she manages with okra water.  She has been diagnosed with neuropathy in her foot. She wears hospital socks and shoes around the house to manage this condition. No walking barefoot; she wears shoes or slippers around the house.  She exercises when possible, focusing on keeping her legs active, but notes that the weather has limited her ability to walk outside.   Headache  This is a chronic problem. The current episode started more than 1 month ago. The problem occurs constantly (reports almost daily upon awakening).  The problem has been gradually worsening. The pain is located in the Bilateral region. The pain does not radiate. The pain quality is not similar to prior headaches. The quality of the pain is described as aching. Pertinent negatives include no abdominal pain or dizziness.     Past Medical History:  Diagnosis Date   Acute left-sided low back pain with left-sided sciatica 05/04/2018   Benign paroxysmal positional vertigo 08/26/2012   Chest pain 09/21/2012   CVA (cerebral infarction) 08/25/2012   Diabetes mellitus without complication (HCC)    Dyslipidemia 08/25/2012   Essential hypertension, benign 08/25/2012   Hyperlipidemia    Hypertension    Meralgia paraesthetica, left 01/12/2018   Shortness of breath 05/04/2018   Stroke (HCC)      Family History  Problem Relation Age of Onset   Kidney failure Mother    Diabetes Mother    Alzheimer's disease Mother    Alzheimer's disease Father    Prostate cancer Father    Diabetes Maternal Grandmother    Diabetes Maternal Grandfather    Alzheimer's disease Paternal Grandmother    Alzheimer's disease Paternal Grandfather    Prostate cancer Paternal Uncle      Current Outpatient Medications:    acetaminophen  (TYLENOL ) 500 MG tablet, Take 1 tablet (500 mg total) by mouth every 6 (six) hours as needed., Disp: 30 tablet, Rfl: 0   albuterol  (VENTOLIN  HFA) 108 (90 Base) MCG/ACT inhaler, Inhale 2 puffs into the lungs every 6 (six) hours as needed for wheezing or shortness of breath., Disp: 8.5 g, Rfl: 2  atorvastatin  (LIPITOR) 80 MG tablet, TAKE 1 TABLET BY MOUTH ONCE  DAILY, Disp: 100 tablet, Rfl: 2   Blood Glucose Monitoring Suppl (ACCU-CHEK GUIDE ME) w/Device KIT, USE TO TEST BLOOD SUGAR TWICE DAILY. (MAY SUBSTITUTE TO ANY MANUFACTURER COVERED BY PATIENT'S INSURANCE)., Disp: 1 kit, Rfl: 0   budesonide -glycopyrrolate-formoterol  (BREZTRI  AEROSPHERE) 160-9-4.8 MCG/ACT AERO inhaler, Inhale 2 puffs into the lungs daily., Disp: 32.1 g, Rfl: 3    cholecalciferol (VITAMIN D3) 25 MCG (1000 UNIT) tablet, Take 1,000 Units by mouth daily., Disp: , Rfl:    clopidogrel  (PLAVIX ) 75 MG tablet, TAKE 1 TABLET BY MOUTH DAILY, Disp: 100 tablet, Rfl: 2   dapagliflozin  propanediol (FARXIGA ) 10 MG TABS tablet, Take 1 tablet (10 mg total) by mouth daily before breakfast., Disp: 90 tablet, Rfl: 2   diazepam  (VALIUM ) 5 MG tablet, Take 5 mg by mouth every 8 (eight) hours as needed for anxiety., Disp: , Rfl:    donepezil  (ARICEPT ) 10 MG tablet, TAKE 1 TABLET BY MOUTH AT  BEDTIME, Disp: 100 tablet, Rfl: 2   ezetimibe  (ZETIA ) 10 MG tablet, TAKE 1 TABLET BY MOUTH DAILY, Disp: 100 tablet, Rfl: 2   gabapentin  (NEURONTIN ) 100 MG capsule, TAKE 1 CAPSULE BY MOUTH 3 TIMES  DAILY AS NEEDED, Disp: 300 capsule, Rfl: 2   Glucose Blood (BLOOD GLUCOSE TEST STRIPS) STRP, Use to test blood sugar twice daily. (May substitute to any manufacturer covered by patient's insurance.) Accu-Check Guide/Accu-Check guide Me, Disp: 100 strip, Rfl: 12   Lancet Device MISC, Use to test blood sugar twice daily. (May substitute to any manufacturer covered by patient's insurance.) Accu-Check Guide/Accu-Check guide Me, Disp: 1 each, Rfl: 0   Lancets Misc. MISC, Use to test blood sugar twice daily. (May substitute to any manufacturer covered by patient's insurance.) Accu-Check Guide/Accu-Check guide Me, Disp: 100 each, Rfl: 12   Magnesium  Glycinate 120 MG CAPS, Take 1 tablet by mouth every evening., Disp: 90 capsule, Rfl: 1   meclizine  (ANTIVERT ) 25 MG tablet, Take 1 tablet (25 mg total) by mouth 3 (three) times daily as needed for dizziness., Disp: 60 tablet, Rfl: 1   mometasone  (NASONEX ) 50 MCG/ACT nasal spray, Place 2 sprays into the nose daily., Disp: 1 each, Rfl: 2   olmesartan -hydrochlorothiazide  (BENICAR  HCT) 40-25 MG tablet, Take 1 tablet by mouth daily., Disp: , Rfl:    Olopatadine  HCl 0.6 % SOLN, Place 2 sprays into the nose 2 (two) times daily as needed., Disp: 30.5 g, Rfl: 5    oxymetazoline  (AFRIN) 0.05 % nasal spray, Place 1 spray into both nostrils 2 (two) times daily as needed., Disp: , Rfl:    pantoprazole  (PROTONIX ) 40 MG tablet, TAKE 1 TABLET BY MOUTH DAILY, Disp: 100 tablet, Rfl: 2   vitamin C  (ASCORBIC ACID ) 500 MG tablet, Take 500 mg by mouth daily., Disp: , Rfl:    VITAMIN D  PO, Take 1 tablet by mouth daily., Disp: , Rfl:   Current Facility-Administered Medications:    0.9 %  sodium chloride  infusion, 500 mL, Intravenous, Once, Abran Norleen SAILOR, MD   No Known Allergies    The patient states she uses post menopausal status for birth control. No LMP recorded. Patient is postmenopausal.. Negative for Dysmenorrhea and Negative for Menorrhagia. Negative for: breast discharge, breast lump(s), breast pain and breast self exam. Associated symptoms include abnormal vaginal bleeding. Pertinent negatives include abnormal bleeding (hematology), anxiety, decreased libido, depression, difficulty falling sleep, dyspareunia, history of infertility, nocturia, sexual dysfunction, sleep disturbances, urinary incontinence, urinary urgency, vaginal discharge and  vaginal itching. Diet regular.The patient states her exercise level is none.  . The patient's tobacco use is:  Social History   Tobacco Use  Smoking Status Former   Current packs/day: 0.00   Average packs/day: 0.3 packs/day for 30.0 years (9.0 ttl pk-yrs)   Types: Cigarettes   Start date: 44   Quit date: 2019   Years since quitting: 6.8  Smokeless Tobacco Never  . She has been exposed to passive smoke. The patient's alcohol use is:  Social History   Substance and Sexual Activity  Alcohol Use Not Currently   Comment: occasional    Review of Systems  Constitutional: Negative.   HENT: Negative.    Eyes: Negative.   Respiratory: Negative.    Cardiovascular: Negative.   Gastrointestinal: Negative.  Negative for abdominal pain.  Endocrine: Negative.   Genitourinary: Negative.   Musculoskeletal: Negative.    Skin: Negative.   Allergic/Immunologic: Negative.   Neurological: Negative.  Positive for headaches. Negative for dizziness.  Hematological: Negative.   Psychiatric/Behavioral: Negative.       Today's Vitals   12/09/23 1405 12/09/23 1505  BP: 120/76 120/70  Pulse: 68   Temp: 98.2 F (36.8 C)   TempSrc: Oral   Weight: 186 lb (84.4 kg)   Height: 5' 6 (1.676 m)   PainSc: 0-No pain    Body mass index is 30.02 kg/m.  Wt Readings from Last 3 Encounters:  12/09/23 186 lb (84.4 kg)  12/04/23 184 lb (83.5 kg)  10/16/23 181 lb 9.6 oz (82.4 kg)     Objective:  Physical Exam Vitals and nursing note reviewed.  Constitutional:      General: She is not in acute distress.    Appearance: Normal appearance. She is well-developed. She is obese.  HENT:     Head: Atraumatic.     Right Ear: Hearing, tympanic membrane, ear canal and external ear normal. There is no impacted cerumen.     Left Ear: Hearing, tympanic membrane, ear canal and external ear normal. There is no impacted cerumen.     Nose: Nose normal.     Mouth/Throat:     Mouth: Mucous membranes are moist.  Eyes:     General: Lids are normal.     Conjunctiva/sclera: Conjunctivae normal.     Pupils: Pupils are equal, round, and reactive to light.     Funduscopic exam:    Right eye: No papilledema.        Left eye: No papilledema.  Neck:     Thyroid : No thyroid  mass.     Vascular: No carotid bruit.  Cardiovascular:     Rate and Rhythm: Normal rate and regular rhythm.     Pulses: Normal pulses.     Heart sounds: Normal heart sounds. No murmur heard. Pulmonary:     Effort: Pulmonary effort is normal. No respiratory distress.     Breath sounds: Normal breath sounds. No wheezing.  Abdominal:     General: Abdomen is flat. Bowel sounds are normal.     Palpations: Abdomen is soft.  Musculoskeletal:        General: No swelling. Normal range of motion.     Cervical back: Full passive range of motion without pain, normal range  of motion and neck supple.     Thoracic back: Scoliosis present.     Right lower leg: No edema.     Left lower leg: No edema.  Skin:    General: Skin is warm and dry.  Capillary Refill: Capillary refill takes less than 2 seconds.  Neurological:     General: No focal deficit present.     Mental Status: She is alert and oriented to person, place, and time.     Cranial Nerves: No cranial nerve deficit.     Sensory: No sensory deficit.     Motor: No weakness.  Psychiatric:        Mood and Affect: Mood normal.        Behavior: Behavior normal.        Thought Content: Thought content normal.        Judgment: Judgment normal.     Assessment And Plan:     Encounter for annual health examination Assessment & Plan: Routine adult wellness visit with discussion on exercise, skin care, and hearing aid maintenance. - Encouraged regular exercise as tolerated. - Advised use of Eucerin or Lubriderm for skin moisturizing. - Recommended follow-up with hearing aid provider for ear wax management.   Type 2 DM with CKD stage 3 and hypertension (HCC) Assessment & Plan: Diabetic neuropathy with numbness in feet. - Encouraged use of non-slip socks for safety.  Orders: -     CMP14+EGFR -     Hemoglobin A1c  Mixed hyperlipidemia Assessment & Plan: Cholesterol levels are stable. Continue current medications.   Orders: -     Lipid panel  Hypertensive nephropathy Assessment & Plan: Hypertensive chronic kidney disease stage 3b. Blood pressure management is ongoing. - Continue current antihypertensive regimen. - Rechecked blood pressure before leaving the clinic.   Other long term (current) drug therapy -     CBC  Encounter for screening mammogram for breast cancer -     3D Screening Mammogram, Left and Right; Future  Stage 3b chronic kidney disease (HCC)  Chronic constipation Assessment & Plan: Chronic constipation managed with dietary fiber and okra water. Discussion on potential  use of Linzess if current regimen is insufficient. - Continue current regimen with dietary fiber and okra water. - Will consider Linzess if constipation persists.   Benign paroxysmal positional vertigo due to bilateral vestibular disorder Assessment & Plan: Intermittent dizziness. Meclizine  prescribed for symptomatic relief. - Use Meclizine  as needed for dizziness, particularly when getting out of bed.   Functional urinary incontinence Assessment & Plan: Intermittent urinary incontinence managed with medication as needed.      Return for 1 year physical. Patient was given opportunity to ask questions. Patient verbalized understanding of the plan and was able to repeat key elements of the plan. All questions were answered to their satisfaction.   [name], FNP  I, [name], FNP, have reviewed all documentation for this visit. The documentation on 12/09/23 for the exam, diagnosis, procedures, and orders are all accurate and complete.

## 2023-12-09 NOTE — Patient Instructions (Addendum)
 Health Maintenance, Female Adopting a healthy lifestyle and getting preventive care are important in promoting health and wellness. Ask your health care provider about: The right schedule for you to have regular tests and exams. Things you can do on your own to prevent diseases and keep yourself healthy. What should I know about diet, weight, and exercise? Eat a healthy diet  Eat a diet that includes plenty of vegetables, fruits, low-fat dairy products, and lean protein. Do not eat a lot of foods that are high in solid fats, added sugars, or sodium. Maintain a healthy weight Body mass index (BMI) is used to identify weight problems. It estimates body fat based on height and weight. Your health care provider can help determine your BMI and help you achieve or maintain a healthy weight. Get regular exercise Get regular exercise. This is one of the most important things you can do for your health. Most adults should: Exercise for at least 150 minutes each week. The exercise should increase your heart rate and make you sweat (moderate-intensity exercise). Do strengthening exercises at least twice a week. This is in addition to the moderate-intensity exercise. Spend less time sitting. Even light physical activity can be beneficial. Watch cholesterol and blood lipids Have your blood tested for lipids and cholesterol at 83 years of age, then have this test every 5 years. Have your cholesterol levels checked more often if: Your lipid or cholesterol levels are high. You are older than 83 years of age. You are at high risk for heart disease. What should I know about cancer screening? Depending on your health history and family history, you may need to have cancer screening at various ages. This may include screening for: Breast cancer. Cervical cancer. Colorectal cancer. Skin cancer. Lung cancer. What should I know about heart disease, diabetes, and high blood pressure? Blood pressure and heart  disease High blood pressure causes heart disease and increases the risk of stroke. This is more likely to develop in people who have high blood pressure readings or are overweight. Have your blood pressure checked: Every 3-5 years if you are 44-90 years of age. Every year if you are 72 years old or older. Diabetes Have regular diabetes screenings. This checks your fasting blood sugar level. Have the screening done: Once every three years after age 65 if you are at a normal weight and have a low risk for diabetes. More often and at a younger age if you are overweight or have a high risk for diabetes. What should I know about preventing infection? Hepatitis B If you have a higher risk for hepatitis B, you should be screened for this virus. Talk with your health care provider to find out if you are at risk for hepatitis B infection. Hepatitis C Testing is recommended for: Everyone born from [birth year] through 1965. Anyone with known risk factors for hepatitis C. Sexually transmitted infections (STIs) Get screened for STIs, including gonorrhea and chlamydia, if: You are sexually active and are younger than 83 years of age. You are older than 83 years of age and your health care provider tells you that you are at risk for this type of infection. Your sexual activity has changed since you were last screened, and you are at increased risk for chlamydia or gonorrhea. Ask your health care provider if you are at risk. Ask your health care provider about whether you are at high risk for HIV. Your health care provider may recommend a prescription medicine to help prevent HIV  infection. If you choose to take medicine to prevent HIV, you should first get tested for HIV. You should then be tested every 3 months for as long as you are taking the medicine. Pregnancy If you are about to stop having your period (premenopausal) and you may become pregnant, seek counseling before you get pregnant. Take 400 to 800  micrograms (mcg) of folic acid every day if you become pregnant. Ask for birth control (contraception) if you want to prevent pregnancy. Osteoporosis and menopause Osteoporosis is a disease in which the bones lose minerals and strength with aging. This can result in bone fractures. If you are 64 years old or older, or if you are at risk for osteoporosis and fractures, ask your health care provider if you should: Be screened for bone loss. Take a calcium  or vitamin D  supplement to lower your risk of fractures. Be given hormone replacement therapy (HRT) to treat symptoms of menopause. Follow these instructions at home: Alcohol use Do not drink alcohol if: Your health care provider tells you not to drink. You are pregnant, may be pregnant, or are planning to become pregnant. If you drink alcohol: Limit how much you have to: 0-1 drink a day. Know how much alcohol is in your drink. In the [place] one drink equals one 12 oz bottle of beer (355 mL), one 5 oz glass of wine (148 mL), or one 1 oz glass of hard liquor (44 mL). Lifestyle Do not use any products that contain nicotine or tobacco. These products include cigarettes, chewing tobacco, and vaping devices, such as e-cigarettes. If you need help quitting, ask your health care provider. Do not use street drugs. Do not share needles. Ask your health care provider for help if you need support or information about quitting drugs. General instructions Schedule regular health, dental, and eye exams. Stay current with your vaccines. Tell your health care provider if: You often feel depressed. You have ever been abused or do not feel safe at home. Summary Adopting a healthy lifestyle and getting preventive care are important in promoting health and wellness. Follow your health care provider's instructions about healthy diet, exercising, and getting tested or screened for diseases. Follow your health care provider's instructions on monitoring your  cholesterol and blood pressure. This information is not intended to replace advice given to you by your health care provider. Make sure you discuss any questions you have with your health care provider.  Only take your meclizine  when you need it, no more than 3 times a day. ]5 Document Revised: 06/05/2020 Document Reviewed: 06/05/2020 Elsevier Patient Education  2024 [employer].

## 2023-12-10 LAB — HEMOGLOBIN A1C
Est. average glucose Bld gHb Est-mCnc: 174 mg/dL
Hgb A1c MFr Bld: 7.7 % — ABNORMAL HIGH (ref 4.8–5.6)

## 2023-12-10 LAB — CBC
Hematocrit: 37.5 % (ref 34.0–46.6)
Hemoglobin: 12 g/dL (ref 11.1–15.9)
MCH: 28.6 pg (ref 26.6–33.0)
MCHC: 32 g/dL (ref 31.5–35.7)
MCV: 90 fL (ref 79–97)
Platelets: 259 x10E3/uL (ref 150–450)
RBC: 4.19 x10E6/uL (ref 3.77–5.28)
RDW: 13.9 % (ref 11.7–15.4)
WBC: 7.2 x10E3/uL (ref 3.4–10.8)

## 2023-12-10 LAB — LIPID PANEL
Chol/HDL Ratio: 2.9 ratio (ref 0.0–4.4)
Cholesterol, Total: 150 mg/dL (ref 100–199)
HDL: 51 mg/dL (ref >39)
LDL Chol Calc (NIH): 60 mg/dL (ref 0–99)
Triglycerides: 242 mg/dL — ABNORMAL HIGH (ref 0–149)
VLDL Cholesterol Cal: 39 mg/dL (ref 5–40)

## 2023-12-10 LAB — CMP14+EGFR
ALT: 10 IU/L (ref 0–32)
AST: 19 IU/L (ref 0–40)
Albumin: 4.3 g/dL (ref 3.7–4.7)
Alkaline Phosphatase: 101 IU/L (ref 48–129)
BUN/Creatinine Ratio: 7 — ABNORMAL LOW (ref 12–28)
BUN: 11 mg/dL (ref 8–27)
Bilirubin Total: 0.3 mg/dL (ref 0.0–1.2)
CO2: 22 mmol/L (ref 20–29)
Calcium: 9.8 mg/dL (ref 8.7–10.3)
Chloride: 100 mmol/L (ref 96–106)
Creatinine, Ser: 1.59 mg/dL — ABNORMAL HIGH (ref 0.57–1.00)
Globulin, Total: 2.5 g/dL (ref 1.5–4.5)
Glucose: 102 mg/dL — ABNORMAL HIGH (ref 70–99)
Potassium: 4.3 mmol/L (ref 3.5–5.2)
Sodium: 139 mmol/L (ref 134–144)
Total Protein: 6.8 g/dL (ref 6.0–8.5)
eGFR: 32 mL/min/1.73 — ABNORMAL LOW (ref >59)

## 2023-12-11 ENCOUNTER — Telehealth: Payer: Self-pay

## 2023-12-11 NOTE — Patient Instructions (Signed)
 Inocente PARAS Besecker - I am sorry I was unable to reach you today for our scheduled appointment. I work with Georgina Speaks, FNP and am calling to support your healthcare needs.   Your next care management appointment is by telephone on Monday, December 8 at 2:00 PM  Please call the care guide team at [phone] if you need to cancel, schedule, or reschedule an appointment.   Please call the Suicide and Crisis Lifeline: 988 if you are experiencing a Mental Health or Behavioral Health Crisis or need someone to talk to.  Clayborne Ly RN BSN CCM [place]  [facility], [facility] Health Nurse Care Coordinator  Direct Dial: [phone] Website: [email or web address]

## 2023-12-15 ENCOUNTER — Ambulatory Visit: Payer: Self-pay | Admitting: Nurse Practitioner

## 2023-12-15 ENCOUNTER — Encounter: Payer: Self-pay | Admitting: Nurse Practitioner

## 2023-12-15 DIAGNOSIS — I129 Hypertensive chronic kidney disease with stage 1 through stage 4 chronic kidney disease, or unspecified chronic kidney disease: Secondary | ICD-10-CM | POA: Insufficient documentation

## 2023-12-15 DIAGNOSIS — K5909 Other constipation: Secondary | ICD-10-CM | POA: Insufficient documentation

## 2023-12-15 DIAGNOSIS — R3981 Functional urinary incontinence: Secondary | ICD-10-CM | POA: Insufficient documentation

## 2023-12-15 NOTE — Assessment & Plan Note (Signed)
 Chronic constipation managed with dietary fiber and okra water. Discussion on potential use of Linzess if current regimen is insufficient. - Continue current regimen with dietary fiber and okra water. - Will consider Linzess if constipation persists.

## 2023-12-15 NOTE — Assessment & Plan Note (Signed)
 Routine adult wellness visit with discussion on exercise, skin care, and hearing aid maintenance. - Encouraged regular exercise as tolerated. - Advised use of Eucerin or Lubriderm for skin moisturizing. - Recommended follow-up with hearing aid provider for ear wax management.

## 2023-12-15 NOTE — Assessment & Plan Note (Signed)
 Diabetic neuropathy with numbness in feet. - Encouraged use of non-slip socks for safety.

## 2023-12-15 NOTE — Assessment & Plan Note (Signed)
 Intermittent urinary incontinence managed with medication as needed.

## 2023-12-15 NOTE — Assessment & Plan Note (Signed)
 Hypertensive chronic kidney disease stage 3b. Blood pressure management is ongoing. - Continue current antihypertensive regimen. - Rechecked blood pressure before leaving the clinic.

## 2023-12-15 NOTE — Assessment & Plan Note (Signed)
 Intermittent dizziness. Meclizine  prescribed for symptomatic relief. - Use Meclizine  as needed for dizziness, particularly when getting out of bed.

## 2023-12-15 NOTE — Assessment & Plan Note (Signed)
 Cholesterol levels are stable. Continue current medications

## 2023-12-22 ENCOUNTER — Other Ambulatory Visit: Payer: Self-pay | Admitting: Family Medicine

## 2023-12-22 ENCOUNTER — Other Ambulatory Visit: Payer: Self-pay | Admitting: Nurse Practitioner

## 2023-12-22 DIAGNOSIS — R12 Heartburn: Secondary | ICD-10-CM

## 2023-12-24 ENCOUNTER — Encounter (INDEPENDENT_AMBULATORY_CARE_PROVIDER_SITE_OTHER): Payer: Self-pay

## 2024-01-02 ENCOUNTER — Telehealth: Payer: Self-pay | Admitting: Licensed Clinical Social Worker

## 2024-01-02 ENCOUNTER — Encounter: Payer: Self-pay | Admitting: Licensed Clinical Social Worker

## 2024-01-02 NOTE — Patient Instructions (Signed)
 Donna Gilmore - I am sorry I was unable to reach you today for our scheduled appointment. I work with Georgina Speaks, FNP and am calling to support your healthcare needs. Please contact me at [phone] at your earliest convenience. I look forward to speaking with you soon.   Thank you,  Rolin Kerns, LCSW [place]  [facility], [facility] Clinical Social Worker Direct Dial: [phone]  Fax: [phone] Website: [email or web address] 5:05 PM

## 2024-01-05 ENCOUNTER — Telehealth: Payer: Self-pay | Admitting: Nurse Practitioner

## 2024-01-05 ENCOUNTER — Telehealth

## 2024-01-05 ENCOUNTER — Telehealth: Payer: Self-pay

## 2024-01-05 NOTE — Patient Instructions (Signed)
 Donna Gilmore - I am sorry I was unable to reach you today for our scheduled appointment.  Your next care management appointment is by telephone on Wednesday, December 24 at 12:00 PM  Please call the care guide team at [phone] if you need to cancel, schedule, or reschedule an appointment.   Please call 1-800-273-TALK (toll free, 24 hour hotline) if you are experiencing a Mental Health or Behavioral Health Crisis or need someone to talk to.  Thank you,  Clayborne Ly RN BSN CCM [place]  [facility], [facility] Health Nurse Care Coordinator  Direct Dial: [phone] Website: [email or web address]

## 2024-01-05 NOTE — Telephone Encounter (Deleted)
 Patient was identified as falling into the [place] Measure - Diabetes.   Patient was: Appointment scheduled with primary care provider in the next 30 days.

## 2024-01-13 ENCOUNTER — Ambulatory Visit

## 2024-01-16 ENCOUNTER — Telehealth: Payer: Self-pay | Admitting: Allergy

## 2024-01-16 NOTE — Telephone Encounter (Signed)
 [facility] closed out the referral due to not being able to contact patient after multiple attempts

## 2024-01-20 ENCOUNTER — Encounter: Payer: Self-pay | Admitting: Neurology

## 2024-01-20 ENCOUNTER — Ambulatory Visit: Admitting: Neurology

## 2024-01-21 ENCOUNTER — Telehealth

## 2024-01-21 NOTE — Patient Instructions (Addendum)
 Donna Gilmore - I am sorry I was unable to reach you today for our scheduled appointment.   Your next care management appointment is by telephone on Monday, January 19 at 1:30 PM  Please call the care guide team at [phone] if you need to cancel, schedule, or reschedule an appointment.   Please call 1-800-273-TALK (toll free, 24 hour hotline) if you are experiencing a Mental Health or Behavioral Health Crisis or need someone to talk to.  Clayborne Ly RN BSN CCM [place]  [facility], [facility] Health Nurse Care Coordinator  Direct Dial: [phone] Website: [email or web address]

## 2024-01-30 ENCOUNTER — Telehealth: Payer: Self-pay | Admitting: Licensed Clinical Social Worker

## 2024-01-30 ENCOUNTER — Encounter: Payer: Self-pay | Admitting: Licensed Clinical Social Worker

## 2024-01-30 NOTE — Patient Instructions (Signed)
 Donna Gilmore - I have attempted to call you three times but have been unsuccessful in reaching you. I work with Georgina Speaks, FNP and am calling to support your healthcare needs. If I can be of assistance to you, please contact me at [phone].     Thank you,  Rolin Kerns, LCSW [place]  [facility], [facility] Clinical Social Worker Direct Dial: [phone]  Fax: [phone] Website: [email or web address] 4:24 PM

## 2024-02-02 ENCOUNTER — Other Ambulatory Visit: Payer: Self-pay | Admitting: Nurse Practitioner

## 2024-02-02 NOTE — Telephone Encounter (Signed)
 Copied from CRM [id number]. Topic: Clinical - Prescription Issue >> Feb 02, 2024  2:59 PM China J wrote: Reason for CRM: Patient's son is questioning why the patient's medication has not been sent in today. I let him know that since he requested the medication today, there is a possibility that it can take up to 3-business days for an approval from her PCP.   He was not too happy with the turn around time and let me know that if the medication is not approved by tomorrow, he will switch providers. I let him know that I will take note of that and go from there. I also offered to submit a complaint on his behalf but he did not respond to my offer.

## 2024-02-02 NOTE — Telephone Encounter (Signed)
 Copied from CRM [id number]. Topic: Clinical - Medication Refill >> Feb 02, 2024 10:39 AM Rea C wrote: Medication: olmesartan -hydrochlorothiazide  (BENICAR  HCT) 40-25 MG tablet  Has the patient contacted their pharmacy? Yes (Agent: If no, request that the patient contact the pharmacy for the refill. If patient does not wish to contact the pharmacy document the reason why and proceed with request.) (Agent: If yes, when and what did the pharmacy advise?)  This is the patient's preferred pharmacy:  [facility] [place], [place] - [address] [place] [address] Phone: [phone] Fax: [phone]   Is this the correct pharmacy for this prescription? Yes If no, delete pharmacy and type the correct one.   Has the prescription been filled recently? Yes  Is the patient out of the medication? Yes  Has the patient been seen for an appointment in the last year OR does the patient have an upcoming appointment? Yes  Can we respond through MyChart? Yes  Agent: Please be advised that Rx refills may take up to 3 business days. We ask that you follow-up with your pharmacy.

## 2024-02-03 MED ORDER — OLMESARTAN MEDOXOMIL-HCTZ 40-25 MG PO TABS: 1.0000 | ORAL_TABLET | Freq: Every day | ORAL | 1 refills | Status: AC

## 2024-02-10 ENCOUNTER — Other Ambulatory Visit: Payer: Self-pay | Admitting: Nurse Practitioner

## 2024-02-10 DIAGNOSIS — E1122 Type 2 diabetes mellitus with diabetic chronic kidney disease: Secondary | ICD-10-CM

## 2024-02-10 MED ORDER — BLOOD GLUCOSE TEST VI STRP: ORAL_STRIP | 0 refills | Status: AC

## 2024-02-10 NOTE — Telephone Encounter (Signed)
 Copied from CRM #[id number]. Topic: Clinical - Medication Refill >> Feb 10, 2024  1:00 PM Hadassah PARAS wrote: Medication: Glucose Blood (BLOOD GLUCOSE TEST STRIPS) STRP. Pt has ran out of strips and would like to pick up at [facility]. Would like to have these delivered after through [facility]  Has the patient contacted their pharmacy? No (Agent: If no, request that the patient contact the pharmacy for the refill. If patient does not wish to contact the pharmacy document the reason why and proceed with request.) (Agent: If yes, when and what did the pharmacy advise?)  This is the patient's preferred pharmacy:  [facility] [address], [place] - [address] [place] [address] Phone: [phone] Fax: [phone]   [facility] - [place], [place] - [address] [place]  [address] Phone: [phone] Fax: [phone]     Is this the correct pharmacy for this prescription? Yes If no, delete pharmacy and type the correct one.   Has the prescription been filled recently? Yes  Is the patient out of the medication? Yes  Has the patient been seen for an appointment in the last year OR does the patient have an upcoming appointment? Yes  Can we respond through MyChart? Yes  Agent: Please be advised that Rx refills may take up to 3 business days. We ask that you follow-up with your pharmacy.

## 2024-02-16 ENCOUNTER — Other Ambulatory Visit: Payer: Self-pay

## 2024-02-16 NOTE — Patient Outreach (Signed)
 Spoke with son Jerrell who requested I call patient on her home phone #. Advised son Jerrell, patient missed her appointment with [facility] on 01/20/24. Discussed he may call their office to reschedule when ready. Advised Jerrell a letter was mailed out on 01/20/24 from this provider stating the same and he verbalizes understanding. Unable to reach patient on her home #, recording states unable to leave a message. Patient closed from complex care management due to having  #4 unsuccessful attempts to reach patient. Routed note to PCP to advise of case closure.   Clayborne Ly RN BSN CCM [place]  [facility], [facility] Health Nurse Care Coordinator  Direct Dial: [phone] Website: [email or web address]

## 2024-02-16 NOTE — Patient Outreach (Signed)
 Complex Care Management   Visit Note  02/16/2024  Name:  Donna Gilmore MRN: [id number] DOB: 1940-11-03  Situation: Referral received for Complex Care Management related to  CVA, Diabetes with Complications, and Sensorineural hearing loss of both ears, chronic back and shoulder pain, mild cognitive impairment, Impaired Physical Mobility with worsening fatigue, Benign paroxysmal positional vertigo, COPD with Asthma, OSA, Nightmares. I obtained verbal consent from Patient.  Visit completed with Patient on the phone.  Background:   Past Medical History:  Diagnosis Date   Acute left-sided low back pain with left-sided sciatica 05/04/2018   Benign paroxysmal positional vertigo 08/26/2012   Chest pain 09/21/2012   CVA (cerebral infarction) 08/25/2012   Diabetes mellitus without complication (HCC)    Dyslipidemia 08/25/2012   Essential hypertension, benign 08/25/2012   Hyperlipidemia    Hypertension    Meralgia paraesthetica, left 01/12/2018   Shortness of breath 05/04/2018   Stroke [facility])     Assessment: Patient Reported Symptoms:  Cognitive Cognitive Status: Alert and oriented to person, place, and time, Normal speech and language skills Cognitive/Intellectual Conditions Management [RPT]: Other Other: Mild Cognitive Impairment, Nightmares   Health Maintenance Behaviors: Annual physical exam, Exercise Health Facilitated by: Healthy diet, Rest  Neurological Neurological Review of Symptoms: Dizziness, Weakness, Hearing changes [place] Neurological Symptoms/Conditions [RPT]: bilateral neurosensory hearing loss; Benign paroxysmal positional vertigo; nightmares Neurological Management Strategies: Medication therapy, Routine screening, Adequate rest Neurological Self-Management Outcome: 3 (uncertain) Neurological Comment: dizziness has improved, nightmares are ongoing, discussed with patient and son Jerrell patient's missed appt with Neurology, son Jerrell will reschedule  HEENT HEENT  Symptoms Reported: Not assessed      Cardiovascular Cardiovascular Symptoms Reported: Fatigue Does patient have uncontrolled Hypertension?: No Cardiovascular Management Strategies: Adequate rest Cardiovascular Self-Management Outcome: 3 (uncertain) Cardiovascular Comment: patient will f/u with PCP on 02/17/24 for in home PT evaluation  Respiratory Respiratory Symptoms Reported: Not assesed    Endocrine Endocrine Symptoms Reported: Not assessed    Gastrointestinal Gastrointestinal Symptoms Reported: Not assessed      Genitourinary Genitourinary Symptoms Reported: Not assessed    Integumentary Integumentary Symptoms Reported: Not assessed    Musculoskeletal Musculoskelatal Symptoms Reviewed: Limited mobility, Unsteady gait, Weakness Additional Musculoskeletal Details: patient would like to request in home PT to assist with strengthening Musculoskeletal Management Strategies: Adequate rest, Medical device Musculoskeletal Self-Management Outcome: 2 (bad) Falls in the past year?: No Number of falls in past year: 1 or less Was there an injury with Fall?: No Fall Risk Category Calculator: 0 Patient Fall Risk Level: Low Fall Risk Patient at Risk for Falls Due to: Impaired balance/gait, Impaired mobility Fall risk Follow up: Falls evaluation completed  Psychosocial Psychosocial Symptoms Reported: Alteration in sleep habits, Nightmares Behavioral Management Strategies: Adequate rest Behavioral Health Self-Management Outcome: 3 (uncertain) Major Change/Loss/Stressor/Fears (CP): Medical condition, self Techniques to Cope with Loss/Stress/Change: Diversional activities Quality of Family Relationships: supportive, involved, helpful Do you feel physically threatened by others?: No    02/16/2024    PHQ2-9 Depression Screening   Aryonna Gunnerson interest or pleasure in doing things    Feeling down, depressed, or hopeless    PHQ-2 - Total Score    Trouble falling or staying asleep, or sleeping too much     Feeling tired or having Greysyn Vanderberg energy    Poor appetite or overeating     Feeling bad about yourself - or that you are a failure or have let yourself or your family down    Trouble concentrating on things, such as reading the newspaper  or watching television    Moving or speaking so slowly that other people could have noticed.  Or the opposite - being so fidgety or restless that you have been moving around a lot more than usual    Thoughts that you would be better off dead, or hurting yourself in some way    PHQ2-9 Total Score    If you checked off any problems, how difficult have these problems made it for you to do your work, take care of things at home, or get along with other people    Depression Interventions/Treatment      There were no vitals filed for this visit. Pain Scale: Not given for pain  Medications Reviewed Today     Reviewed by Morgan Clayborne CROME, RN (Registered Nurse) on 02/16/24 at 1636  Med List Status: <None>   Medication Order Taking? Sig Documenting Provider Last Dose Status Informant  0.9 %  sodium chloride  infusion [id number]   Abran Norleen SAILOR, MD  Active   acetaminophen  (TYLENOL ) 500 MG tablet [id number]  Take 1 tablet (500 mg total) by mouth every 6 (six) hours as needed. Blaise Aleene KIDD, MD  Active   albuterol  (VENTOLIN  HFA) 108 [phone] Base) MCG/ACT inhaler [id number]  Inhale 2 puffs into the lungs every 6 (six) hours as needed for wheezing or shortness of breath. Georgina Speaks, FNP  Active   atorvastatin  (LIPITOR) 80 MG tablet [id number]  TAKE 1 TABLET BY MOUTH ONCE  DAILY Georgina Speaks, FNP  Active   Blood Glucose Monitoring Suppl (ACCU-CHEK GUIDE ME) w/Device KIT [id number]  USE TO TEST BLOOD SUGAR TWICE DAILY. (MAY SUBSTITUTE TO ANY MANUFACTURER COVERED BY PATIENT'S INSURANCE). Georgina Speaks, FNP  Active   budesonide -glycopyrrolate-formoterol  (BREZTRI  AEROSPHERE) 160-9-4.8 MCG/ACT AERO inhaler [id number]  Inhale 2 puffs into the lungs daily. Georgina Speaks, FNP  Active    cholecalciferol (VITAMIN D3) 25 MCG (1000 UNIT) tablet [id number]  Take 1,000 Units by mouth daily. [provider]  Active   clopidogrel  (PLAVIX ) 75 MG tablet [id number]  TAKE 1 TABLET BY MOUTH DAILY Georgina Speaks, FNP  Active   dapagliflozin  propanediol (FARXIGA ) 10 MG TABS tablet [id number]  Take 1 tablet (10 mg total) by mouth daily before breakfast. Georgina Speaks, FNP  Active            Med Note MACK, CASSIUS PARAS   Fri Oct 17, 2023  2:00 PM) GETS THROUGH PATIENT ASSISTANCE  diazepam  (VALIUM ) 5 MG tablet [id number]  Take 5 mg by mouth every 8 (eight) hours as needed for anxiety. [provider]  Active   donepezil  (ARICEPT ) 10 MG tablet [id number]  TAKE 1 TABLET BY MOUTH AT  BEDTIME Georgina Speaks, FNP  Active   ezetimibe  (ZETIA ) 10 MG tablet [id number]  TAKE 1 TABLET BY MOUTH DAILY Moore, Janece, FNP  Active   gabapentin  (NEURONTIN ) 100 MG capsule [id number]  TAKE 1 CAPSULE BY MOUTH 3 TIMES  DAILY AS NEEDED Georgina Speaks, FNP  Active   Glucose Blood (BLOOD GLUCOSE TEST STRIPS) STRP [id number]  Use to test blood sugar twice daily. (May substitute to any manufacturer covered by patient's insurance.) Accu-Check Guide/Accu-Check guide Me Georgina Speaks, FNP  Active   Lancet Device MISC [id number]  Use to test blood sugar twice daily. (May substitute to any manufacturer covered by patient's insurance.) Accu-Check Guide/Accu-Check guide Me Georgina Speaks, FNP  Active   Lancets Misc. MISC [id number]  Use to test blood sugar twice daily. (May substitute to any manufacturer covered by patient's insurance.) Accu-Check  Guide/Accu-Check guide Me Moore, Janece, FNP  Active   Magnesium  Glycinate 120 MG CAPS [id number]  Take 1 tablet by mouth every evening. Georgina Speaks, FNP  Active   meclizine  (ANTIVERT ) 25 MG tablet [id number]  Take 1 tablet (25 mg total) by mouth 3 (three) times daily as needed for dizziness. Georgina Speaks, FNP  Active   mometasone  (NASONEX ) 50 MCG/ACT nasal spray [id number]  Place 2  sprays into the nose daily. Georgina Speaks, FNP  Active   olmesartan -hydrochlorothiazide  (BENICAR  HCT) 40-25 MG tablet [id number]  Take 1 tablet by mouth daily. Georgina Speaks, FNP  Active   Olopatadine  HCl 0.6 % SOLN [id number]  Place 2 sprays into the nose 2 (two) times daily as needed. Jeneal Danita Macintosh, MD  Active   oxymetazoline  (AFRIN) 0.05 % nasal spray [id number]  Place 1 spray into both nostrils 2 (two) times daily as needed. [provider]  Active   pantoprazole  (PROTONIX ) 40 MG tablet [id number]  TAKE 1 TABLET BY MOUTH DAILY Petrina Pries, NP  Active   vitamin C  (ASCORBIC ACID ) 500 MG tablet [id number]  Take 500 mg by mouth daily. [provider]  Active Self  VITAMIN D  PO [id number]  Take 1 tablet by mouth daily. [provider]  Active            Recommendation:   Specialty provider follow-up  Call [facility] to reschedule your missed appointment   PCP Follow-up  02/17/2024 Status: Sch   Time: 12:20 PM Length: 40  Visit Type: OFFICE VISIT [8002] Copay: $0.00  Provider: Bernardo Gaither SAUNDERS, DO Department: [facility]   Follow Up Plan:   Telephone follow up appointment date/time  03/01/2024 Status: Sch   Time: 2:00 PM Length: 30  Visit Type: VBCI TELEPHONE CALL 30 [2502] Copay: $0.00  Provider: Morgan Clayborne CROME, RN Department: [facility]   Clayborne Morgan RN BSN CCM [place]  [facility], [facility] Nurse Care Coordinator  Direct Dial: [phone] Website: [email or web address]

## 2024-02-16 NOTE — Patient Instructions (Signed)
 Donna Gilmore - I am sorry I was unable to reach you today for our scheduled appointment. I work with Georgina Speaks, FNP and am calling to support your healthcare needs. Please contact me at [phone] at your earliest convenience. I look forward to speaking with you soon.   Thank you,  Clayborne Ly RN BSN CCM [place]  [facility], [facility] Nurse Care Coordinator  Direct Dial: [phone] Website: [email or web address]

## 2024-02-16 NOTE — Patient Instructions (Signed)
 Visit Information  Thank you for taking time to visit with me today. Please don't hesitate to contact me if I can be of assistance to you before our next scheduled appointment.  Your next care management appointment is by telephone on Monday, February 2 at 2:00 PM  Please call the care guide team at [phone] if you need to cancel, schedule, or reschedule an appointment.   Please call [phone]-TALK (toll free, 24 hour hotline) if you are experiencing a Mental Health or Behavioral Health Crisis or need someone to talk to.  Clayborne Ly RN BSN CCM [place]  [facility], [facility] Nurse Care Coordinator  Direct Dial: [phone] Website: [email or web address]

## 2024-02-17 ENCOUNTER — Ambulatory Visit

## 2024-02-17 VITALS — BP 120/78 | HR 70 | Temp 98.1°F | Ht 66.0 in | Wt 188.0 lb

## 2024-02-17 DIAGNOSIS — R531 Weakness: Secondary | ICD-10-CM

## 2024-02-17 DIAGNOSIS — G8929 Other chronic pain: Secondary | ICD-10-CM

## 2024-02-17 DIAGNOSIS — Z23 Encounter for immunization: Secondary | ICD-10-CM

## 2024-02-17 NOTE — Assessment & Plan Note (Signed)
 I have my nurse give her the high-dose influenza vaccine 02/17/24.   I reviewed her lab results from 12/09/2023; her hemoglobin A1c has improved to 7.7%, CBC with differential was within normal limits so she is not anemic.  She does have CKD; the estimated GFR was 32 mL/min/1.73 m2.

## 2024-02-17 NOTE — Progress Notes (Signed)
 I,[name], CMA,acting as a neurosurgeon for Donna JONELLE Fischer, DO.,have documented all relevant documentation on the behalf of Donna JONELLE Fischer, DO,as directed by  Donna JONELLE Fischer, DO while in the presence of Donna JONELLE Fischer, DO.  Subjective:  Patient ID: Donna Gilmore , female    DOB: July 12, 1940 , 84 y.o.   MRN: [id number]  Chief Complaint  Patient presents with   Referral    Patient presents today for a referral for Home Health PT. She would like to PT to help with strengthening and endurance.     HPI  HPI   Past Medical History:  Diagnosis Date   Acute left-sided low back pain with left-sided sciatica 05/04/2018   Benign paroxysmal positional vertigo 08/26/2012   Chest pain 09/21/2012   CVA (cerebral infarction) 08/25/2012   Diabetes mellitus without complication (HCC)    Dyslipidemia 08/25/2012   Essential hypertension, benign 08/25/2012   Hyperlipidemia    Hypertension    Meralgia paraesthetica, left 01/12/2018   Shortness of breath 05/04/2018   Stroke (HCC)      Family History  Problem Relation Age of Onset   Kidney failure Mother    Diabetes Mother    Alzheimer's disease Mother    Alzheimer's disease Father    Prostate cancer Father    Diabetes Maternal Grandmother    Diabetes Maternal Grandfather    Alzheimer's disease Paternal Grandmother    Alzheimer's disease Paternal Grandfather    Prostate cancer Paternal Uncle     Current Medications[1]   Allergies[2]   Review of Systems  Constitutional:  Positive for fatigue.  Respiratory:  Positive for shortness of breath (with exertion that chronic).   Cardiovascular:  Negative for chest pain, palpitations and leg swelling.  Musculoskeletal:  Positive for back pain.  Neurological:  Positive for weakness (arms/legs R=L). Negative for dizziness and headaches.  Psychiatric/Behavioral:         No depression     Today's Vitals   02/17/24 1234  BP: 120/78  Pulse: 70  Temp: 98.1 F (36.7 C)   TempSrc: Oral  Weight: 188 lb (85.3 kg)  Height: 5' 6 (1.676 m)  PainSc: 5   PainLoc: Back   Body mass index is 30.34 kg/m.  Wt Readings from Last 3 Encounters:  02/17/24 188 lb (85.3 kg)  12/09/23 186 lb (84.4 kg)  12/04/23 184 lb (83.5 kg)     Objective:  Physical Exam Vitals reviewed.  Constitutional:      Appearance: Normal appearance.     Comments: She has gained 2 pounds since 12/09/23  HENT:     Head: Normocephalic and atraumatic.  Neck:     Vascular: No carotid bruit.  Cardiovascular:     Rate and Rhythm: Normal rate and regular rhythm.     Pulses: Normal pulses.     Heart sounds: Normal heart sounds.  Pulmonary:     Effort: Pulmonary effort is normal.     Breath sounds: Normal breath sounds. No wheezing.  Abdominal:     Palpations: Abdomen is soft.     Tenderness: There is no abdominal tenderness.     Comments: Obese  Skin:    General: Skin is warm and dry.  Neurological:     Mental Status: She is alert.  Psychiatric:        Mood and Affect: Mood normal.        Behavior: Behavior normal.         Assessment And Plan:   Assessment &  Plan Generalized weakness  Need for influenza vaccination   Orders Placed This Encounter  Procedures   Flu vaccine HIGH DOSE PF(Fluzone Trivalent)   Ambulatory referral to Physical Therapy     Return After seeing neurologist.  Patient was given opportunity to ask questions. Patient verbalized understanding of the plan and was able to repeat key elements of the plan. All questions were answered to their satisfaction.    LILLETTE Donna JONELLE Bernardo, DO, have reviewed all documentation for this visit. The documentation on 02/17/24 for the exam, diagnosis, procedures, and orders are all accurate and complete.   IF YOU HAVE BEEN REFERRED TO A SPECIALIST, IT MAY TAKE 1-2 WEEKS TO SCHEDULE/PROCESS THE REFERRAL. IF YOU HAVE NOT HEARD FROM US /SPECIALIST IN TWO WEEKS, PLEASE GIVE US  A CALL AT [phone].        [1]  Current Outpatient Medications:    acetaminophen  (TYLENOL ) 500 MG tablet, Take 1 tablet (500 mg total) by mouth every 6 (six) hours as needed., Disp: 30 tablet, Rfl: 0   albuterol  (VENTOLIN  HFA) 108 (90 Base) MCG/ACT inhaler, Inhale 2 puffs into the lungs every 6 (six) hours as needed for wheezing or shortness of breath., Disp: 8.5 g, Rfl: 2   atorvastatin  (LIPITOR) 80 MG tablet, TAKE 1 TABLET BY MOUTH ONCE  DAILY, Disp: 100 tablet, Rfl: 2   Blood Glucose Monitoring Suppl (ACCU-CHEK GUIDE ME) w/Device KIT, USE TO TEST BLOOD SUGAR TWICE DAILY. (MAY SUBSTITUTE TO ANY MANUFACTURER COVERED BY PATIENT'S INSURANCE)., Disp: 1 kit, Rfl: 0   budesonide -glycopyrrolate-formoterol  (BREZTRI  AEROSPHERE) 160-9-4.8 MCG/ACT AERO inhaler, Inhale 2 puffs into the lungs daily., Disp: 32.1 g, Rfl: 3   cholecalciferol (VITAMIN D3) 25 MCG (1000 UNIT) tablet, Take 1,000 Units by mouth daily., Disp: , Rfl:    clopidogrel  (PLAVIX ) 75 MG tablet, TAKE 1 TABLET BY MOUTH DAILY, Disp: 100 tablet, Rfl: 2   dapagliflozin  propanediol (FARXIGA ) 10 MG TABS tablet, Take 1 tablet (10 mg total) by mouth daily before breakfast., Disp: 90 tablet, Rfl: 2   diazepam  (VALIUM ) 5 MG tablet, Take 5 mg by mouth every 8 (eight) hours as needed for anxiety., Disp: , Rfl:    donepezil  (ARICEPT ) 10 MG tablet, TAKE 1 TABLET BY MOUTH AT  BEDTIME, Disp: 100 tablet, Rfl: 2   ezetimibe  (ZETIA ) 10 MG tablet, TAKE 1 TABLET BY MOUTH DAILY, Disp: 100 tablet, Rfl: 2   gabapentin  (NEURONTIN ) 100 MG capsule, TAKE 1 CAPSULE BY MOUTH 3 TIMES  DAILY AS NEEDED, Disp: 300 capsule, Rfl: 2   Glucose Blood (BLOOD GLUCOSE TEST STRIPS) STRP, Use to test blood sugar twice daily. (May substitute to any manufacturer covered by patient's insurance.) Accu-Check Guide/Accu-Check guide Me, Disp: 100 strip, Rfl: 0   Lancet Device MISC, Use to test blood sugar twice daily. (May substitute to any manufacturer covered by patient's insurance.) Accu-Check  Guide/Accu-Check guide Me, Disp: 1 each, Rfl: 0   Lancets Misc. MISC, Use to test blood sugar twice daily. (May substitute to any manufacturer covered by patient's insurance.) Accu-Check Guide/Accu-Check guide Me, Disp: 100 each, Rfl: 12   Magnesium  Glycinate 120 MG CAPS, Take 1 tablet by mouth every evening., Disp: 90 capsule, Rfl: 1   meclizine  (ANTIVERT ) 25 MG tablet, Take 1 tablet (25 mg total) by mouth 3 (three) times daily as needed for dizziness., Disp: 60 tablet, Rfl: 1   mometasone  (NASONEX ) 50 MCG/ACT nasal spray, Place 2 sprays into the nose daily., Disp: 1 each, Rfl: 2   olmesartan -hydrochlorothiazide  (BENICAR  HCT) 40-25  MG tablet, Take 1 tablet by mouth daily., Disp: 90 tablet, Rfl: 1   Olopatadine  HCl 0.6 % SOLN, Place 2 sprays into the nose 2 (two) times daily as needed., Disp: 30.5 g, Rfl: 5   oxymetazoline  (AFRIN) 0.05 % nasal spray, Place 1 spray into both nostrils 2 (two) times daily as needed., Disp: , Rfl:    pantoprazole  (PROTONIX ) 40 MG tablet, TAKE 1 TABLET BY MOUTH DAILY, Disp: 100 tablet, Rfl: 2   vitamin C  (ASCORBIC ACID ) 500 MG tablet, Take 500 mg by mouth daily., Disp: , Rfl:    VITAMIN D  PO, Take 1 tablet by mouth daily., Disp: , Rfl:   Current Facility-Administered Medications:    0.9 %  sodium chloride  infusion, 500 mL, Intravenous, Once, Abran Norleen SAILOR, MD [2] No Known Allergies

## 2024-02-17 NOTE — Progress Notes (Signed)
 "  Acute Office Visit  Subjective:     Patient ID: Donna Gilmore, female    DOB: 1940/05/07, 84 y.o.   MRN: [id number]  Chief Complaint  Patient presents with   Referral    Patient presents today for a referral for Home Health PT. She would like to PT to help with strengthening and endurance.     This is a 84 year old African-American female who is here today because of GENERALIZED WEAKNESS.  She states that this weakness is chronic in nature.  She states that she gets up around 8 AM in the mornings and her generalized weakness is worse in the mornings and then does improve throughout the daytime.  She has a generalized fatigue and lack of energy.  She states that it takes a major effort for her to get out of bed which is chronic.  She is requesting in-home physical therapy to increase her strength.  She does use a walker for long distances.  She denies any recent falling episodes.  She did miss her neurology appointment on 01/20/2024.  She does have OSTEO ARTHRITIS involving her back, knees and feet.  She does have chronic lower back pain.  She denies any dizziness, headaches, chest pain/chest pressure/chest tightness, bilateral lower extremity edema but does have shortness of breath with exertion which is not new.     Review of Systems  Constitutional:  Positive for malaise/fatigue.  Respiratory:  Positive for shortness of breath.   Cardiovascular:  Negative for chest pain, palpitations and leg swelling.  Musculoskeletal:  Positive for back pain.  Neurological:  Negative for dizziness and headaches.  Psychiatric/Behavioral:  Negative for depression.         Objective:    Vitals:   02/17/24 1234  BP: 120/78  Pulse: 70  Temp: 98.1 F (36.7 C)  Height: 5' 6 (1.676 m)  Weight: 188 lb (85.3 kg)  TempSrc: Oral  BMI (Calculated): 30.36      Physical Exam Vitals and nursing note reviewed.  Constitutional:      Appearance: Normal appearance. She is obese.  HENT:     Head:  Normocephalic and atraumatic.  Neck:     Vascular: No carotid bruit.  Cardiovascular:     Rate and Rhythm: Normal rate and regular rhythm.     Pulses: Normal pulses.     Heart sounds: Normal heart sounds. No murmur heard. Pulmonary:     Effort: Pulmonary effort is normal.     Breath sounds: Normal breath sounds.  Abdominal:     General: Bowel sounds are normal.     Palpations: Abdomen is soft.  Musculoskeletal:     Right lower leg: No edema.     Left lower leg: No edema.  Skin:    General: Skin is warm and dry.  Neurological:     Mental Status: She is alert.  Psychiatric:        Mood and Affect: Mood normal.        Behavior: Behavior normal.    .as No results found for any visits on 02/17/24.      Assessment & Plan:   Assessment & Plan Generalized weakness She was referred for physical therapy on 02/17/2024 preferably pending home to increase her strength. Orders:   Ambulatory referral to Physical Therapy  Need for influenza vaccination I have my nurse give her the high-dose influenza vaccine 02/17/24.   I reviewed her lab results from 12/09/2023; her hemoglobin A1c has improved to 7.7%, CBC with differential  was within normal limits so she is not anemic.  She does have CKD; the estimated GFR was 32 mL/min/1.73 m2.  Chronic bilateral low back pain without sciatica I reviewed the lumbar MRI done on 06/29/2023 which showed degenerative disc disease     Return After seeing neurologist.  [name], DO   "

## 2024-02-17 NOTE — Assessment & Plan Note (Signed)
 I reviewed the lumbar MRI done on 06/29/2023 which showed degenerative disc disease

## 2024-02-17 NOTE — Addendum Note (Signed)
 Addended by: CRAVEN ADELIA PARAS on: 02/17/2024 04:49 PM   Modules accepted: Orders

## 2024-02-17 NOTE — Assessment & Plan Note (Addendum)
 She was referred for physical therapy on 02/17/2024 preferably pending home to increase her strength. Orders:   Ambulatory referral to Physical Therapy

## 2024-02-17 NOTE — Addendum Note (Signed)
 Addended by: BERNARDO CARWIN on: 02/17/2024 05:05 PM   Modules accepted: Orders

## 2024-02-19 NOTE — Addendum Note (Signed)
 Addended by: BERNARDO CARWIN on: 02/19/2024 03:13 PM   Modules accepted: Orders

## 2024-02-26 ENCOUNTER — Telehealth: Payer: Self-pay | Admitting: Pharmacist

## 2024-02-26 DIAGNOSIS — N183 Chronic kidney disease, stage 3 unspecified: Secondary | ICD-10-CM

## 2024-02-26 NOTE — Progress Notes (Signed)
" ° °  02/26/2024 Name: Donna Gilmore MRN: [id number] DOB: [birth year]  Chief Complaint  Patient presents with   Medication Adherence   Patient was called to follow up. Unfortunately, she did not answer her phone and the voicemail was full.  Lab Results  Component Value Date   HGBA1C 7.7 (H) 12/09/2023   HGBA1C 8.1 (H) 05/05/2023   HGBA1C 7.4 (H) 02/13/2023   Farxiga  10 mg - through patient assistance--She is conditionally approved through [employer]. Patient needs to provide consent via telephone with [employer].     Plan: Call Patient back in 2-3 weeks.   Cassius DOROTHA Brought, PharmD, BCACP Clinical Pharmacist [phone]    "

## 2024-03-01 ENCOUNTER — Telehealth: Payer: Self-pay

## 2024-03-17 ENCOUNTER — Ambulatory Visit (HOSPITAL_COMMUNITY)

## 2024-03-18 ENCOUNTER — Telehealth

## 2024-05-17 ENCOUNTER — Ambulatory Visit: Admitting: Neurology

## 2024-12-09 ENCOUNTER — Encounter: Admitting: Nurse Practitioner
